# Patient Record
Sex: Female | Born: 1945 | Race: White | Hispanic: No | Smoking: Never smoker
Health system: Southern US, Community
[De-identification: ages and names within clinical notes are randomized; demographics above are authoritative.]

## PROBLEM LIST (undated history)

## (undated) DIAGNOSIS — T4145XA Adverse effect of unspecified anesthetic, initial encounter: Secondary | ICD-10-CM

## (undated) DIAGNOSIS — E785 Hyperlipidemia, unspecified: Secondary | ICD-10-CM

## (undated) DIAGNOSIS — K5900 Constipation, unspecified: Secondary | ICD-10-CM

## (undated) DIAGNOSIS — C539 Malignant neoplasm of cervix uteri, unspecified: Secondary | ICD-10-CM

## (undated) DIAGNOSIS — M1990 Unspecified osteoarthritis, unspecified site: Secondary | ICD-10-CM

## (undated) DIAGNOSIS — D649 Anemia, unspecified: Secondary | ICD-10-CM

## (undated) DIAGNOSIS — T8859XA Other complications of anesthesia, initial encounter: Secondary | ICD-10-CM

## (undated) DIAGNOSIS — E039 Hypothyroidism, unspecified: Secondary | ICD-10-CM

## (undated) DIAGNOSIS — R351 Nocturia: Secondary | ICD-10-CM

## (undated) DIAGNOSIS — E079 Disorder of thyroid, unspecified: Secondary | ICD-10-CM

## (undated) DIAGNOSIS — D1809 Hemangioma of other sites: Secondary | ICD-10-CM

## (undated) DIAGNOSIS — K56609 Unspecified intestinal obstruction, unspecified as to partial versus complete obstruction: Secondary | ICD-10-CM

## (undated) DIAGNOSIS — K5909 Other constipation: Secondary | ICD-10-CM

## (undated) DIAGNOSIS — C3412 Malignant neoplasm of upper lobe, left bronchus or lung: Principal | ICD-10-CM

## (undated) DIAGNOSIS — N904 Leukoplakia of vulva: Secondary | ICD-10-CM

## (undated) DIAGNOSIS — R7303 Prediabetes: Secondary | ICD-10-CM

## (undated) DIAGNOSIS — D1771 Benign lipomatous neoplasm of kidney: Secondary | ICD-10-CM

## (undated) DIAGNOSIS — C541 Malignant neoplasm of endometrium: Secondary | ICD-10-CM

## (undated) DIAGNOSIS — I10 Essential (primary) hypertension: Secondary | ICD-10-CM

## (undated) HISTORY — PX: EYE SURGERY: SHX253

## (undated) HISTORY — PX: PARTIAL NEPHRECTOMY: SHX414

## (undated) HISTORY — PX: TONSILLECTOMY AND ADENOIDECTOMY: SUR1326

## (undated) HISTORY — PX: VULVA SURGERY: SHX837

## (undated) HISTORY — PX: SKIN BIOPSY: SHX1

## (undated) HISTORY — PX: BACK SURGERY: SHX140

## (undated) HISTORY — DX: Malignant neoplasm of upper lobe, left bronchus or lung: C34.12

## (undated) HISTORY — PX: TUBAL LIGATION: SHX77

---

## 1968-11-10 DIAGNOSIS — Z9889 Other specified postprocedural states: Secondary | ICD-10-CM

## 1968-11-10 HISTORY — DX: Other specified postprocedural states: Z98.890

## 1968-11-10 HISTORY — PX: TUMOR REMOVAL: SHX12

## 1995-02-11 DIAGNOSIS — Z923 Personal history of irradiation: Secondary | ICD-10-CM

## 1995-02-11 DIAGNOSIS — C539 Malignant neoplasm of cervix uteri, unspecified: Secondary | ICD-10-CM

## 1995-02-11 DIAGNOSIS — C541 Malignant neoplasm of endometrium: Secondary | ICD-10-CM

## 1995-02-11 HISTORY — DX: Personal history of irradiation: Z92.3

## 1995-02-11 HISTORY — DX: Malignant neoplasm of endometrium: C54.1

## 1995-02-11 HISTORY — DX: Malignant neoplasm of cervix uteri, unspecified: C53.9

## 1995-03-14 DIAGNOSIS — Z8542 Personal history of malignant neoplasm of other parts of uterus: Secondary | ICD-10-CM

## 1995-03-14 DIAGNOSIS — Z8541 Personal history of malignant neoplasm of cervix uteri: Secondary | ICD-10-CM

## 1995-03-14 HISTORY — PX: ABDOMINAL HYSTERECTOMY: SHX81

## 1995-03-14 HISTORY — DX: Personal history of malignant neoplasm of cervix uteri: Z85.41

## 1995-03-14 HISTORY — DX: Personal history of malignant neoplasm of other parts of uterus: Z85.42

## 1995-03-31 HISTORY — PX: TOTAL ABDOMINAL HYSTERECTOMY W/ BILATERAL SALPINGOOPHORECTOMY: SHX83

## 1997-08-14 ENCOUNTER — Encounter: Admission: RE | Admit: 1997-08-14 | Discharge: 1997-11-12 | Payer: Self-pay | Admitting: Gynecology

## 1997-11-22 ENCOUNTER — Ambulatory Visit: Admission: RE | Admit: 1997-11-22 | Discharge: 1997-11-22 | Payer: Self-pay | Admitting: Gynecology

## 1997-11-22 ENCOUNTER — Other Ambulatory Visit: Admission: RE | Admit: 1997-11-22 | Discharge: 1997-11-22 | Payer: Self-pay | Admitting: Gynecology

## 1997-12-13 ENCOUNTER — Encounter: Payer: Self-pay | Admitting: Emergency Medicine

## 1997-12-13 ENCOUNTER — Ambulatory Visit (HOSPITAL_COMMUNITY): Admission: RE | Admit: 1997-12-13 | Discharge: 1997-12-13 | Payer: Self-pay | Admitting: Emergency Medicine

## 1998-03-27 ENCOUNTER — Ambulatory Visit: Admission: RE | Admit: 1998-03-27 | Discharge: 1998-03-27 | Payer: Self-pay | Admitting: Gynecology

## 1998-03-27 ENCOUNTER — Other Ambulatory Visit: Admission: RE | Admit: 1998-03-27 | Discharge: 1998-03-27 | Payer: Self-pay | Admitting: Gynecology

## 1998-10-09 ENCOUNTER — Ambulatory Visit: Admission: RE | Admit: 1998-10-09 | Discharge: 1998-10-09 | Payer: Self-pay | Admitting: Gynecology

## 1998-10-10 ENCOUNTER — Other Ambulatory Visit: Admission: RE | Admit: 1998-10-10 | Discharge: 1998-10-10 | Payer: Self-pay | Admitting: Gynecology

## 1999-04-24 ENCOUNTER — Ambulatory Visit: Admission: RE | Admit: 1999-04-24 | Discharge: 1999-04-24 | Payer: Self-pay | Admitting: Gynecology

## 1999-04-24 ENCOUNTER — Other Ambulatory Visit: Admission: RE | Admit: 1999-04-24 | Discharge: 1999-04-24 | Payer: Self-pay | Admitting: Gynecology

## 1999-11-05 ENCOUNTER — Ambulatory Visit: Admission: RE | Admit: 1999-11-05 | Discharge: 1999-11-05 | Payer: Self-pay | Admitting: Gynecology

## 1999-11-05 ENCOUNTER — Other Ambulatory Visit: Admission: RE | Admit: 1999-11-05 | Discharge: 1999-11-05 | Payer: Self-pay | Admitting: Gynecology

## 2000-03-31 ENCOUNTER — Ambulatory Visit: Admission: RE | Admit: 2000-03-31 | Discharge: 2000-03-31 | Payer: Self-pay | Admitting: Gynecology

## 2000-03-31 ENCOUNTER — Other Ambulatory Visit: Admission: RE | Admit: 2000-03-31 | Discharge: 2000-03-31 | Payer: Self-pay | Admitting: Gynecology

## 2000-06-23 ENCOUNTER — Ambulatory Visit: Admission: RE | Admit: 2000-06-23 | Discharge: 2000-06-23 | Payer: Self-pay | Admitting: Gynecology

## 2001-03-24 ENCOUNTER — Encounter (INDEPENDENT_AMBULATORY_CARE_PROVIDER_SITE_OTHER): Payer: Self-pay | Admitting: *Deleted

## 2001-03-24 ENCOUNTER — Other Ambulatory Visit: Admission: RE | Admit: 2001-03-24 | Discharge: 2001-03-24 | Payer: Self-pay | Admitting: Gynecology

## 2001-03-24 ENCOUNTER — Ambulatory Visit: Admission: RE | Admit: 2001-03-24 | Discharge: 2001-03-24 | Payer: Self-pay | Admitting: Gynecology

## 2001-04-14 ENCOUNTER — Ambulatory Visit (HOSPITAL_COMMUNITY): Admission: RE | Admit: 2001-04-14 | Discharge: 2001-04-14 | Payer: Self-pay | Admitting: Gastroenterology

## 2002-04-06 ENCOUNTER — Encounter (INDEPENDENT_AMBULATORY_CARE_PROVIDER_SITE_OTHER): Payer: Self-pay | Admitting: Specialist

## 2002-04-06 ENCOUNTER — Ambulatory Visit: Admission: RE | Admit: 2002-04-06 | Discharge: 2002-04-06 | Payer: Self-pay | Admitting: Gynecology

## 2002-04-06 ENCOUNTER — Other Ambulatory Visit: Admission: RE | Admit: 2002-04-06 | Discharge: 2002-04-06 | Payer: Self-pay | Admitting: Gynecology

## 2003-03-15 ENCOUNTER — Other Ambulatory Visit: Admission: RE | Admit: 2003-03-15 | Discharge: 2003-03-15 | Payer: Self-pay | Admitting: Gynecology

## 2003-03-15 ENCOUNTER — Encounter (INDEPENDENT_AMBULATORY_CARE_PROVIDER_SITE_OTHER): Payer: Self-pay | Admitting: Specialist

## 2003-03-15 ENCOUNTER — Ambulatory Visit: Admission: RE | Admit: 2003-03-15 | Discharge: 2003-03-15 | Payer: Self-pay | Admitting: Gynecology

## 2003-04-14 ENCOUNTER — Encounter: Admission: RE | Admit: 2003-04-14 | Discharge: 2003-04-14 | Payer: Self-pay | Admitting: Emergency Medicine

## 2004-02-11 HISTORY — PX: CATARACT EXTRACTION W/ INTRAOCULAR LENS IMPLANT: SHX1309

## 2004-03-08 ENCOUNTER — Other Ambulatory Visit: Admission: RE | Admit: 2004-03-08 | Discharge: 2004-03-08 | Payer: Self-pay | Admitting: Gynecology

## 2004-03-08 ENCOUNTER — Ambulatory Visit: Admission: RE | Admit: 2004-03-08 | Discharge: 2004-03-08 | Payer: Self-pay | Admitting: Gynecology

## 2004-03-08 ENCOUNTER — Encounter (INDEPENDENT_AMBULATORY_CARE_PROVIDER_SITE_OTHER): Payer: Self-pay | Admitting: *Deleted

## 2005-03-21 ENCOUNTER — Ambulatory Visit: Admission: RE | Admit: 2005-03-21 | Discharge: 2005-03-21 | Payer: Self-pay | Admitting: Gynecology

## 2005-03-21 ENCOUNTER — Encounter (INDEPENDENT_AMBULATORY_CARE_PROVIDER_SITE_OTHER): Payer: Self-pay | Admitting: Specialist

## 2005-03-21 ENCOUNTER — Other Ambulatory Visit: Admission: RE | Admit: 2005-03-21 | Discharge: 2005-03-21 | Payer: Self-pay | Admitting: Gynecology

## 2006-04-10 ENCOUNTER — Other Ambulatory Visit: Admission: RE | Admit: 2006-04-10 | Discharge: 2006-04-10 | Payer: Self-pay | Admitting: Gynecology

## 2006-04-10 ENCOUNTER — Encounter (INDEPENDENT_AMBULATORY_CARE_PROVIDER_SITE_OTHER): Payer: Self-pay | Admitting: *Deleted

## 2006-04-10 ENCOUNTER — Ambulatory Visit: Admission: RE | Admit: 2006-04-10 | Discharge: 2006-04-10 | Payer: Self-pay | Admitting: Gynecology

## 2007-03-31 ENCOUNTER — Ambulatory Visit: Admission: RE | Admit: 2007-03-31 | Discharge: 2007-03-31 | Payer: Self-pay | Admitting: Gynecology

## 2007-03-31 ENCOUNTER — Other Ambulatory Visit: Admission: RE | Admit: 2007-03-31 | Discharge: 2007-03-31 | Payer: Self-pay | Admitting: Gynecology

## 2007-03-31 ENCOUNTER — Encounter: Payer: Self-pay | Admitting: Gynecology

## 2007-06-11 ENCOUNTER — Encounter: Admission: RE | Admit: 2007-06-11 | Discharge: 2007-06-11 | Payer: Self-pay | Admitting: Gastroenterology

## 2007-07-07 ENCOUNTER — Emergency Department (HOSPITAL_COMMUNITY): Admission: EM | Admit: 2007-07-07 | Discharge: 2007-07-07 | Payer: Self-pay | Admitting: Emergency Medicine

## 2008-03-29 ENCOUNTER — Other Ambulatory Visit: Admission: RE | Admit: 2008-03-29 | Discharge: 2008-03-29 | Payer: Self-pay | Admitting: Gynecology

## 2008-03-29 ENCOUNTER — Ambulatory Visit: Admission: RE | Admit: 2008-03-29 | Discharge: 2008-03-29 | Payer: Self-pay | Admitting: Gynecology

## 2008-03-29 ENCOUNTER — Encounter: Payer: Self-pay | Admitting: Gynecology

## 2008-12-03 ENCOUNTER — Emergency Department (HOSPITAL_COMMUNITY): Admission: EM | Admit: 2008-12-03 | Discharge: 2008-12-03 | Payer: Self-pay | Admitting: Emergency Medicine

## 2008-12-11 DIAGNOSIS — Z8719 Personal history of other diseases of the digestive system: Secondary | ICD-10-CM

## 2008-12-11 HISTORY — DX: Personal history of other diseases of the digestive system: Z87.19

## 2008-12-12 ENCOUNTER — Inpatient Hospital Stay (HOSPITAL_COMMUNITY): Admission: EM | Admit: 2008-12-12 | Discharge: 2008-12-14 | Payer: Self-pay | Admitting: Emergency Medicine

## 2009-03-02 ENCOUNTER — Other Ambulatory Visit: Admission: RE | Admit: 2009-03-02 | Discharge: 2009-03-02 | Payer: Self-pay | Admitting: Gynecology

## 2009-03-02 ENCOUNTER — Ambulatory Visit: Admission: RE | Admit: 2009-03-02 | Discharge: 2009-03-02 | Payer: Self-pay | Admitting: Gynecology

## 2009-06-26 ENCOUNTER — Ambulatory Visit (HOSPITAL_COMMUNITY): Admission: RE | Admit: 2009-06-26 | Discharge: 2009-06-26 | Payer: Self-pay | Admitting: Urology

## 2009-07-26 ENCOUNTER — Encounter (INDEPENDENT_AMBULATORY_CARE_PROVIDER_SITE_OTHER): Payer: Self-pay | Admitting: Urology

## 2009-07-26 ENCOUNTER — Inpatient Hospital Stay (HOSPITAL_COMMUNITY): Admission: RE | Admit: 2009-07-26 | Discharge: 2009-07-28 | Payer: Self-pay | Admitting: Urology

## 2009-07-26 DIAGNOSIS — Z86018 Personal history of other benign neoplasm: Secondary | ICD-10-CM

## 2009-07-26 HISTORY — DX: Personal history of other benign neoplasm: Z86.018

## 2009-07-26 HISTORY — PX: ROBOTIC ASSITED PARTIAL NEPHRECTOMY: SHX6087

## 2009-09-06 ENCOUNTER — Encounter: Payer: Self-pay | Admitting: Pulmonary Disease

## 2009-09-06 ENCOUNTER — Encounter: Admission: RE | Admit: 2009-09-06 | Discharge: 2009-09-06 | Payer: Self-pay | Admitting: Family Medicine

## 2009-09-07 ENCOUNTER — Ambulatory Visit: Payer: Self-pay | Admitting: Pulmonary Disease

## 2009-09-07 DIAGNOSIS — J90 Pleural effusion, not elsewhere classified: Secondary | ICD-10-CM | POA: Insufficient documentation

## 2009-09-07 DIAGNOSIS — E785 Hyperlipidemia, unspecified: Secondary | ICD-10-CM | POA: Insufficient documentation

## 2009-09-07 DIAGNOSIS — J309 Allergic rhinitis, unspecified: Secondary | ICD-10-CM | POA: Insufficient documentation

## 2009-09-07 DIAGNOSIS — I10 Essential (primary) hypertension: Secondary | ICD-10-CM | POA: Insufficient documentation

## 2009-09-07 DIAGNOSIS — R0602 Shortness of breath: Secondary | ICD-10-CM | POA: Insufficient documentation

## 2009-09-07 DIAGNOSIS — C539 Malignant neoplasm of cervix uteri, unspecified: Secondary | ICD-10-CM | POA: Insufficient documentation

## 2009-09-07 DIAGNOSIS — C549 Malignant neoplasm of corpus uteri, unspecified: Secondary | ICD-10-CM | POA: Insufficient documentation

## 2009-09-07 DIAGNOSIS — Z8542 Personal history of malignant neoplasm of other parts of uterus: Secondary | ICD-10-CM | POA: Insufficient documentation

## 2009-09-10 ENCOUNTER — Ambulatory Visit (HOSPITAL_COMMUNITY): Admission: RE | Admit: 2009-09-10 | Discharge: 2009-09-10 | Payer: Self-pay | Admitting: Pulmonary Disease

## 2009-09-10 ENCOUNTER — Encounter: Payer: Self-pay | Admitting: Pulmonary Disease

## 2009-09-10 ENCOUNTER — Telehealth (INDEPENDENT_AMBULATORY_CARE_PROVIDER_SITE_OTHER): Payer: Self-pay | Admitting: *Deleted

## 2009-09-12 ENCOUNTER — Telehealth (INDEPENDENT_AMBULATORY_CARE_PROVIDER_SITE_OTHER): Payer: Self-pay | Admitting: *Deleted

## 2009-09-17 ENCOUNTER — Encounter: Payer: Self-pay | Admitting: Pulmonary Disease

## 2009-09-20 ENCOUNTER — Inpatient Hospital Stay (HOSPITAL_COMMUNITY): Admission: EM | Admit: 2009-09-20 | Discharge: 2009-09-21 | Payer: Self-pay | Admitting: Emergency Medicine

## 2009-10-17 ENCOUNTER — Encounter: Payer: Self-pay | Admitting: Pulmonary Disease

## 2009-10-18 ENCOUNTER — Ambulatory Visit: Payer: Self-pay | Admitting: Pulmonary Disease

## 2009-10-18 DIAGNOSIS — R911 Solitary pulmonary nodule: Secondary | ICD-10-CM | POA: Insufficient documentation

## 2010-03-13 ENCOUNTER — Other Ambulatory Visit (HOSPITAL_COMMUNITY)
Admission: RE | Admit: 2010-03-13 | Discharge: 2010-03-13 | Disposition: A | Discharge status: Home / Self Care | Payer: Medicare Other | Source: Ambulatory Visit | Attending: Gynecology | Admitting: Gynecology

## 2010-03-13 ENCOUNTER — Ambulatory Visit: Payer: Medicare Other | Attending: Gynecology | Admitting: Gynecology

## 2010-03-13 ENCOUNTER — Other Ambulatory Visit: Payer: Self-pay | Admitting: Gynecology

## 2010-03-13 DIAGNOSIS — C539 Malignant neoplasm of cervix uteri, unspecified: Secondary | ICD-10-CM | POA: Insufficient documentation

## 2010-03-13 DIAGNOSIS — Z923 Personal history of irradiation: Secondary | ICD-10-CM | POA: Insufficient documentation

## 2010-03-13 DIAGNOSIS — C549 Malignant neoplasm of corpus uteri, unspecified: Secondary | ICD-10-CM | POA: Insufficient documentation

## 2010-03-13 DIAGNOSIS — Z90710 Acquired absence of both cervix and uterus: Secondary | ICD-10-CM | POA: Insufficient documentation

## 2010-03-13 DIAGNOSIS — Z09 Encounter for follow-up examination after completed treatment for conditions other than malignant neoplasm: Secondary | ICD-10-CM | POA: Insufficient documentation

## 2010-03-13 DIAGNOSIS — Z8540 Personal history of malignant neoplasm of unspecified female genital organ: Secondary | ICD-10-CM | POA: Insufficient documentation

## 2010-03-13 DIAGNOSIS — L940 Localized scleroderma [morphea]: Secondary | ICD-10-CM | POA: Insufficient documentation

## 2010-03-14 NOTE — Progress Notes (Signed)
Summary: results  Phone Note Call from Patient Call back at Home Phone [phone]   Caller: Patient Call For: clance Summary of Call: pt wants results of "analysis of fluid" from thoracentesis. very anxious to have these results asap.  Initial call taken by: Tivis Ringer, CNA,  September 12, 2009 3:26 PM  Follow-up for Phone Call        called and spoke with pt.  pt states she  had thoracentesis done at [facility] on Monday 09-10-2009.  This was ordered by [facility].  Pt aware KC out of office until next week.  Pt is very "anxious and concerned" about these results and does not want to wait until next week.  Will forward message to doc of the day to address. Marland KitchenArman Filter LPN  September 12, 2009 4:03 PM   Additional Follow-up for Phone Call Additional follow up Details #1::        spoke with RB regarding pt.  RB stated he will reveiw pt's case and adress with her tomorrow.  Called and spoke with pt.  pt aware.  Aundra Millet Reynolds LPN  September 12, 2009 5:24 PM   pt would like a call back, says she can't do anything until she gets these results.  She says she has been waiting and was told RB would call her this morning.  I told her he wasn't expected in the office until afternoon.  She would like someone to call her and let her know when she will be contacted.  [phone] Additional Follow-up by: Eugene Gavia,  September 13, 2009 12:00 PM    Additional Follow-up for Phone Call Additional follow up Details #2::    will forward this message back to RB to address.  Aundra Millet Reynolds LPN  September 13, 2009 1:23 PM   Pt called again, she is very upset never got a call regarding these results.  I advised RB is still seeing pt's and that the msg will be addressed today before he leaves and someone will call her. Vernie Murders  September 13, 2009 4:56 PM  Reviewed pleural fluid results w patient and also CT-PA results from 8/1. Showed exudate with "atypical cells, ? atypical and reactive mesothelial cells". The CT scan  confirmed small RLL nodules.  Discussed the DDx for both. Underscored that there was no evidence for CA at this time but other studies are probably going to be helpful - serial CT scans, ? repeat thora, etc. I will leave the discussion and planning of further eval to Dr Shelle Iron. Leslye Peer MD  September 13, 2009 5:37 PM     Follow-up by: Leslye Peer MD,  September 13, 2009 5:37 PM   Appended Document: results called and reviewed everything again, and set up followup plan

## 2010-03-14 NOTE — Assessment & Plan Note (Signed)
Summary: consult for pleural effusion   Copy to:  [name] Richards Primary Provider/Referring Provider:  [name]  CC:  Pulmonary Consult.  History of Present Illness: The pt is a pleasant 65y/o female who I have been asked to see for a large right pleural effusion.  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  She had no breathing issues or chest pain while in the hospital.  She was discharged home, and describes 3 episodes over the last 3 weeks where she would get sob while exerting herself, and were associated with increased heart rate.  She has had no issues with ADL's.  She denies any symptoms after hospitalization c/w chest cold or infection.  She was seen by her primary md, and was found to have an elevated d-dimer.  She underwent ct chest which showed a large right pleural effusion, 2 RLL nodules that were quite small, no significant subdiaphragmatic process, and no obvious PE.  However, this was felt to be poor study for PE due to motion artifact and compressive atx of RLL/RML.  Currently, the pt feels a pressure in her chest, and has had one episode of sharp pain 2 weeks ago in the early am upon arising.  She denies any mucus production, hemoptysis, or consistent pleuritic chest pain.  She does feel that she has dyspnea above her usual baseline.  Prior to this, she had no signficant pulmonary history, except for "tok[place] asthma" for which she uses albuterol as needed.  Preventive Screening-Counseling & Management  Alcohol-Tobacco     Smoking Status: never  Current Medications (verified): 1)  Simvastatin 40 Mg Tabs (Simvastatin) .... Take 1 Tablet By Mouth Once A Day 2)  Trazodone Hcl 50 Mg Tabs (Trazodone Hcl) .... Take 1/2 Tab By Mouth At Bedtime 3)  Levothyroxine Sodium 75 Mcg Tabs (Levothyroxine Sodium) .... Take 1 Tablet By Mouth Once A Day 4)  Benazepril-Hydrochlorothiazide  20-12.5 Mg Tabs (Benazepril-Hydrochlorothiazide) .... Take 1 Tablet By Mouth Once A Day 5)  Clobetasol Propionate 0.05 % Gel (Clobetasol Propionate) .... Use As Directed As Needed 6)  Ventolin Hfa 108 (90 Base) Mcg/act  Aers (Albuterol Sulfate) .Marland Kitchen.. 1-2 Puffs Every 4-6 Hours As Needed  Allergies (verified): 1)  ! Compazine 2)  ! Codeine 3)  ! 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 heart disease: mother, father, sister, son cancer; sister (kidney)   Social History: Reviewed history and no changes required. Patient never smoked.  pt is divorced and lives alone. pt has children. 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  Smoking Status:  never  Review of Systems       The patient complains of shortness of breath with activity, irregular heartbeats, and fever.  The patient denies shortness of breath at rest, productive cough, non-productive cough, coughing up blood, chest pain, acid heartburn, indigestion, loss of appetite, weight change, abdominal pain, difficulty swallowing, sore throat, tooth/dental problems, headaches, nasal congestion/difficulty breathing through nose, sneezing, itching, ear ache, anxiety, depression, hand/feet swelling, joint stiffness or pain, rash, and change in color of mucus.    Vital Signs:  Patient profile:   65 year old female Height:  65.5 inches Weight:      142.25 pounds BMI:     23.40 O2 Sat:      100 % on Room air Temp:     98.4 degrees F oral Pulse rate:   98 / minute BP sitting:   134 / 76  (left arm) Cuff size:   regular  Vitals Entered By: Arman Filter LPN (September 07, 2009 10:42 AM)  O2 Flow:   Room air CC: Pulmonary Consult Comments Medications reviewed with patient Arman Filter LPN  September 07, 2009 10:52 AM    Physical Exam  General:  wd female in nad Eyes:  PERRLA and EOMI.   Nose:  patent without discharge Mouth:  clear  Neck:  no jvd, tmg, LN Lungs:  decreased bs deep in right base, no e to a changes. dull to percussion in left base. left lung clear. Heart:  rrr, no mrg Abdomen:  soft and nontender, bs+ Extremities:  no edema , calf tenderness, or varicosities noted pulses intact distally Neurologic:  alert and oriented, moves all 4.   Impression & Recommendations:  Problem # 1:  PLEURAL EFFUSION, RIGHT (ICD-511.9) the pt has a large right effusion with compressive atx that almost certainly has something to do with her recent surgery.  Unclear if sympathetic effusion, transdiaphragmatic ascites, ?associated with an aspiration event, ?PE.  At this point, will do right thoracentesis, and send fluid for the appropriate studies.  Will then do a ct chest to r/o PE and also evaluate for pulmonary or subdiaphragmatic process.  Problem # 2:  DYSPNEA (ICD-786.05) the pt does not have severe doe, but does have sob above her usual baseline.  I suspect this is secondary to her large effusion, but we must exclude the possibility of VTE given the issue started postop and she had a high d-dimer.  Will do a repeat ct angio after she has had her right thoracentesis.  Medications Added to Medication List This Visit: 1)  Simvastatin 40 Mg Tabs (Simvastatin) .... Take 1 tablet by mouth once a day 2)  Trazodone Hcl 50 Mg Tabs (Trazodone hcl) .... Take 1/2 tab by mouth at bedtime 3)  Levothyroxine Sodium 75 Mcg Tabs (Levothyroxine sodium) .... Take 1 tablet by mouth once a day 4)  Benazepril-hydrochlorothiazide 20-12.5 Mg Tabs (Benazepril-hydrochlorothiazide) .... Take 1 tablet by mouth once a day 5)  Clobetasol Propionate 0.05 % Gel (Clobetasol propionate) .... Use as directed as  needed 6)  Ventolin Hfa 108 (90 Base) Mcg/act Aers (Albuterol sulfate) .Marland Kitchen.. 1-2 puffs every 4-6 hours as needed  Other Orders: Consultation Level V (16109) Radiology Referral (Radiology)    Patient Instructions: 1)  will schedule for fluid removal today, followed by repeat ct chest to make sure no blood clots. 2)  will call once we have results of fluid back and ct scan.  Please call if worsening breathing in the interim.

## 2010-03-14 NOTE — Assessment & Plan Note (Signed)
Summary: rov for pleural effusion.   Copy to:  [name] Richards Primary Provider/Referring Provider:  Laurann Richards  CC:  4 week follow up and pt states "I'm having no problems at all". Pt states she is back to exercising 5-6 days a week and feels fine.  History of Present Illness: the pt comes in today for f/u of her recent right pleural effusion.  She is s/p thoracentesis, where fluid was found to be inflammatory, but no evidence for infection or malignancy.  The pt has done very well since her procedure, and feels that she is back to her usual baseline.  She is exercising alot, and denies any issues with exertional tolerance.  Preventive Screening-Counseling & Management  Alcohol-Tobacco     Smoking Status: never  Current Medications (verified): 1)  Simvastatin 20 Mg Tabs (Simvastatin) .... One Tab Once Daily 2)  Trazodone Hcl 50 Mg Tabs (Trazodone Hcl) .... Take 1/2 Tab By Mouth At Bedtime 3)  Levothyroxine Sodium 75 Mcg Tabs (Levothyroxine Sodium) .... Take 1 Tablet By Mouth Once A Day 4)  Benazepril-Hydrochlorothiazide 20-12.5 Mg Tabs (Benazepril-Hydrochlorothiazide) .... Take 1 Tablet By Mouth Once A Day 5)  Clobetasol Propionate 0.05 % Gel (Clobetasol Propionate) .... Use As Directed As Needed  Allergies (verified): 1)  ! Compazine 2)  ! Codeine 3)  ! Penicillin  Review of Systems  The patient denies shortness of breath with activity, shortness of breath at rest, productive cough, non-productive cough, coughing up blood, chest pain, irregular heartbeats, acid heartburn, indigestion, loss of appetite, weight change, abdominal pain, difficulty swallowing, sore throat, tooth/dental problems, headaches, nasal congestion/difficulty breathing through nose, sneezing, itching, ear ache, anxiety, depression, hand/feet swelling, joint stiffness or pain, rash, change in color of mucus, and fever.    Vital Signs:  Patient profile:   65 year old female Height:      65.5 inches Weight:       141.25 pounds O2 Sat:      100 % on Room air Temp:     98.2 degrees F oral Pulse rate:   54 / minute BP sitting:   140 / 82  (left arm) Cuff size:   regular  Vitals Entered By: [name] Richards (October 18, 2009 10:13 AM)  O2 Flow:  Room air CC: 4 week follow up, pt states "I'm having no problems at all". Pt states she is back to exercising 5-6 days a week and feels fine Is Patient Diabetic? No Comments meds and allergies updated Phone number updated [name] Richards  October 18, 2009 10:13 AM    Physical Exam  General:  wd female in nad Lungs:  totally clear to auscultation Heart:  rrr, no mrg Extremities:  no edema or cyanosis  Neurologic:  alert and oriented, moves all 4.   Impression & Recommendations:  Problem # 1:  PLEURAL EFFUSION, RIGHT (ICD-511.9) the pt's cxr is totally clear today, and I suspect her prior effusion was inflammatory from her surgery or represented transdiaphragmatic ascites.  The pt feels well, and is exercising vigorously with no sob.  No further f/u required.  Problem # 2:  PULMONARY NODULE (ICD-518.89) the pt did have a few small nodules on her ct chest of unknown significance.  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  She has kept up with her health maintenance.  I would like to check a f/u ct chest in one year, and  she is to call next summer to set up.  Medications Added to Medication List This Visit: 1)  Simvastatin 20 Mg Tabs (Simvastatin) .... One tab once daily  Other Orders: Est. Patient Level III (04540)  Patient Instructions: 1)  continue on exercise program.  Let me know if you feel your breathing is an issue 2)  can take zyrtec 10mg  otc for allergy symptoms. 3)  please call next august to set up ct chest to check your nodules again.   Immunization History:  Influenza Immunization History:    Influenza:  historical (11/10/2008)

## 2010-03-14 NOTE — Miscellaneous (Signed)
  Clinical Lists Changes  pleural fluid shows 2570 wbc's with unrevealing diff, gram stain negative, exudative by protein criteria. cytology shows some atypical and reactive mesothelial cells.  I have spoken with pt at great length about this, and have decided to see her back in 4 weeks with cxr.  She will call sooner if symptoms worsen.  Will also consider doing spirometry if cxr is ok to evaluate her "[place] asthma".  Appended Document:  pt is already scheduled for 4 week f/u appt...OV is 10-18-2009 at 10:00 am put order in IDX for cxr prior to appt.

## 2010-03-14 NOTE — Miscellaneous (Signed)
Summary: Orders Update  Clinical Lists Changes  Orders: Added new Test order of T-2 View CXR (71020TC) - Signed 

## 2010-03-14 NOTE — Progress Notes (Signed)
Summary: Requesting results today  Phone Note Call from Patient Call back at Home Phone [phone]   Caller: Patient Call For: clance Reason for Call: Talk to Nurse, Lab or Test Results Summary of Call: pt wants to make sure she doesnt have a blood clot.  Can someone call her w/results of CT today? Initial call taken by: Eugene Gavia,  September 10, 2009 4:13 PM  Follow-up for Phone Call        Dr. Shelle Iron, pt requesting results of CT today.  Results are in EMR but unsigned.  Pls advise.  Thanks! Gweneth Dimitri RN  September 10, 2009 4:19 PM   Additional Follow-up for Phone Call Additional follow up Details #1::        please let pt know that no blood clot on ct chest.  Will await results of analysis of fluid, and I will call her. Additional Follow-up by: Barbaraann Share MD,  September 10, 2009 5:07 PM    Additional Follow-up for Phone Call Additional follow up Details #2::    Spoke with pt and notified of results/recs per [facility].  Pt verbalized understanding.   Follow-up by: Vernie Murders,  September 10, 2009 5:14 PM

## 2010-04-26 LAB — COMPREHENSIVE METABOLIC PANEL
ALT: 12 U/L (ref 0–35)
ALT: 17 U/L (ref 0–35)
AST: 17 U/L (ref 0–37)
AST: 20 U/L (ref 0–37)
Albumin: 2.8 g/dL — ABNORMAL LOW (ref 3.5–5.2)
Albumin: 4.1 g/dL (ref 3.5–5.2)
Alkaline Phosphatase: 49 U/L (ref 39–117)
Alkaline Phosphatase: 70 U/L (ref 39–117)
BUN: 10 mg/dL (ref 6–23)
BUN: 13 mg/dL (ref 6–23)
CO2: 26 mEq/L (ref 19–32)
CO2: 27 mEq/L (ref 19–32)
Calcium: 8.1 mg/dL — ABNORMAL LOW (ref 8.4–10.5)
Calcium: 9.8 mg/dL (ref 8.4–10.5)
Chloride: 104 mEq/L (ref 96–112)
Chloride: 95 mEq/L — ABNORMAL LOW (ref 96–112)
Creatinine, Ser: 0.74 mg/dL (ref 0.4–1.2)
Creatinine, Ser: 0.75 mg/dL (ref 0.4–1.2)
GFR calc Af Amer: >60 mL/min (ref >60)
GFR calc Af Amer: >60 mL/min (ref >60)
GFR calc non Af Amer: >60 mL/min (ref >60)
GFR calc non Af Amer: >60 mL/min (ref >60)
Glucose, Bld: 119 mg/dL — ABNORMAL HIGH (ref 70–99)
Glucose, Bld: 159 mg/dL — ABNORMAL HIGH (ref 70–99)
Potassium: 3.2 mEq/L — ABNORMAL LOW (ref 3.5–5.1)
Potassium: 3.3 mEq/L — ABNORMAL LOW (ref 3.5–5.1)
Sodium: 135 mEq/L (ref 135–145)
Sodium: 137 mEq/L (ref 135–145)
Total Bilirubin: 0.4 mg/dL (ref 0.3–1.2)
Total Bilirubin: 0.5 mg/dL (ref 0.3–1.2)
Total Protein: 4.9 g/dL — ABNORMAL LOW (ref 6.0–8.3)
Total Protein: 6.8 g/dL (ref 6.0–8.3)

## 2010-04-26 LAB — BODY FLUID CELL COUNT WITH DIFFERENTIAL
Eos, Fluid: 1 %
Lymphs, Fluid: 31 %
Monocyte-Macrophage-Serous Fluid: 45 % — ABNORMAL LOW (ref 50–90)
Neutrophil Count, Fluid: 23 % (ref 0–25)
Total Nucleated Cell Count, Fluid: 2570 cu mm — ABNORMAL HIGH (ref 0–1000)

## 2010-04-26 LAB — DIFFERENTIAL
Basophils Absolute: 0.1 10*3/uL (ref 0.0–0.1)
Basophils Relative: 1 % (ref 0–1)
Eosinophils Absolute: 0 10*3/uL (ref 0.0–0.7)
Eosinophils Relative: 0 % (ref 0–5)
Lymphocytes Relative: 8 % — ABNORMAL LOW (ref 12–46)
Lymphs Abs: 0.9 10*3/uL (ref 0.7–4.0)
Monocytes Absolute: 0.3 10*3/uL (ref 0.1–1.0)
Monocytes Relative: 3 % (ref 3–12)
Neutro Abs: 9.5 10*3/uL — ABNORMAL HIGH (ref 1.7–7.7)
Neutrophils Relative %: 88 % — ABNORMAL HIGH (ref 43–77)

## 2010-04-26 LAB — URINE CULTURE
Colony Count: NO GROWTH
Culture  Setup Time: 201108120234
Culture: NO GROWTH

## 2010-04-26 LAB — URINALYSIS, ROUTINE W REFLEX MICROSCOPIC
Bilirubin Urine: NEGATIVE
Bilirubin Urine: NEGATIVE
Glucose, UA: NEGATIVE mg/dL
Glucose, UA: NEGATIVE mg/dL
Hgb urine dipstick: NEGATIVE
Hgb urine dipstick: NEGATIVE
Ketones, ur: NEGATIVE mg/dL
Nitrite: NEGATIVE
Nitrite: NEGATIVE
Protein, ur: NEGATIVE mg/dL
Protein, ur: NEGATIVE mg/dL
Specific Gravity, Urine: 1.006 (ref 1.005–1.030)
Specific Gravity, Urine: 1.013 (ref 1.005–1.030)
Urobilinogen, UA: 0.2 mg/dL (ref 0.0–1.0)
Urobilinogen, UA: 0.2 mg/dL (ref 0.0–1.0)
pH: 7 (ref 5.0–8.0)
pH: 8.5 — ABNORMAL HIGH (ref 5.0–8.0)

## 2010-04-26 LAB — LACTIC ACID, PLASMA: Lactic Acid, Venous: 1.3 mmol/L (ref 0.5–2.2)

## 2010-04-26 LAB — CBC
HCT: 27.7 % — ABNORMAL LOW (ref 36.0–46.0)
HCT: 36.8 % (ref 36.0–46.0)
Hemoglobin: 12.3 g/dL (ref 12.0–15.0)
Hemoglobin: 9.3 g/dL — ABNORMAL LOW (ref 12.0–15.0)
MCH: 22.2 pg — ABNORMAL LOW (ref 26.0–34.0)
MCH: 22.3 pg — ABNORMAL LOW (ref 26.0–34.0)
MCHC: 33.3 g/dL (ref 30.0–36.0)
MCHC: 33.4 g/dL (ref 30.0–36.0)
MCV: 66.5 fL — ABNORMAL LOW (ref 78.0–100.0)
MCV: 66.6 fL — ABNORMAL LOW (ref 78.0–100.0)
Platelets: 240 10*3/uL (ref 150–400)
Platelets: 361 10*3/uL (ref 150–400)
RBC: 4.16 MIL/uL (ref 3.87–5.11)
RBC: 5.53 MIL/uL — ABNORMAL HIGH (ref 3.87–5.11)
RDW: 14.4 % (ref 11.5–15.5)
RDW: 14.9 % (ref 11.5–15.5)
WBC: 10.8 10*3/uL — ABNORMAL HIGH (ref 4.0–10.5)
WBC: 6.3 10*3/uL (ref 4.0–10.5)

## 2010-04-26 LAB — PROTEIN, BODY FLUID: Total protein, fluid: 4.5 g/dL

## 2010-04-26 LAB — URINE MICROSCOPIC-ADD ON

## 2010-04-26 LAB — TSH: TSH: 0.545 u[IU]/mL (ref 0.350–4.500)

## 2010-04-26 LAB — BODY FLUID CULTURE
Culture: NO GROWTH
Gram Stain: NONE SEEN

## 2010-04-26 LAB — GLUCOSE, SEROUS FLUID: Glucose, Fluid: 118 mg/dL

## 2010-04-26 LAB — LIPASE, BLOOD: Lipase: 36 U/L (ref 11–59)

## 2010-04-26 LAB — HEMOGLOBIN A1C
Hgb A1c MFr Bld: 5.9 % — ABNORMAL HIGH (ref <5.7)
Mean Plasma Glucose: 123 mg/dL — ABNORMAL HIGH (ref <117)

## 2010-04-26 LAB — PATHOLOGIST SMEAR REVIEW

## 2010-04-26 LAB — GLUCOSE, CAPILLARY
Glucose-Capillary: 102 mg/dL — ABNORMAL HIGH (ref 70–99)
Glucose-Capillary: 133 mg/dL — ABNORMAL HIGH (ref 70–99)
Glucose-Capillary: 149 mg/dL — ABNORMAL HIGH (ref 70–99)
Glucose-Capillary: 98 mg/dL (ref 70–99)

## 2010-04-26 LAB — LACTATE DEHYDROGENASE, PLEURAL OR PERITONEAL FLUID: LD, Fluid: 85 U/L — ABNORMAL HIGH (ref 3–23)

## 2010-04-28 LAB — CBC
HCT: 27.9 % — ABNORMAL LOW (ref 36.0–46.0)
HCT: 31.7 % — ABNORMAL LOW (ref 36.0–46.0)
Hemoglobin: 10.4 g/dL — ABNORMAL LOW (ref 12.0–15.0)
Hemoglobin: 8.8 g/dL — ABNORMAL LOW (ref 12.0–15.0)
MCHC: 31.7 g/dL (ref 30.0–36.0)
MCHC: 33 g/dL (ref 30.0–36.0)
MCV: 70.4 fL — ABNORMAL LOW (ref 78.0–100.0)
MCV: 70.5 fL — ABNORMAL LOW (ref 78.0–100.0)
Platelets: 161 10*3/uL (ref 150–400)
Platelets: 166 10*3/uL (ref 150–400)
RBC: 3.95 MIL/uL (ref 3.87–5.11)
RBC: 4.5 MIL/uL (ref 3.87–5.11)
RDW: 12.6 % (ref 11.5–15.5)
RDW: 14.1 % (ref 11.5–15.5)
WBC: 11.8 10*3/uL — ABNORMAL HIGH (ref 4.0–10.5)
WBC: 6.3 10*3/uL (ref 4.0–10.5)

## 2010-04-28 LAB — DIFFERENTIAL
Basophils Absolute: 0 10*3/uL (ref 0.0–0.1)
Basophils Relative: 0 % (ref 0–1)
Eosinophils Absolute: 0 10*3/uL (ref 0.0–0.7)
Eosinophils Relative: 0 % (ref 0–5)
Lymphocytes Relative: 13 % (ref 12–46)
Lymphs Abs: 0.8 10*3/uL (ref 0.7–4.0)
Monocytes Absolute: 0.6 10*3/uL (ref 0.1–1.0)
Monocytes Relative: 9 % (ref 3–12)
Neutro Abs: 4.9 10*3/uL (ref 1.7–7.7)
Neutrophils Relative %: 78 % — ABNORMAL HIGH (ref 43–77)

## 2010-04-28 LAB — BASIC METABOLIC PANEL
BUN: 5 mg/dL — ABNORMAL LOW (ref 6–23)
BUN: 7 mg/dL (ref 6–23)
CO2: 26 mEq/L (ref 19–32)
CO2: 26 mEq/L (ref 19–32)
Calcium: 7.9 mg/dL — ABNORMAL LOW (ref 8.4–10.5)
Calcium: 8 mg/dL — ABNORMAL LOW (ref 8.4–10.5)
Chloride: 100 mEq/L (ref 96–112)
Chloride: 104 mEq/L (ref 96–112)
Creatinine, Ser: 0.68 mg/dL (ref 0.4–1.2)
Creatinine, Ser: 0.77 mg/dL (ref 0.4–1.2)
GFR calc Af Amer: >60 mL/min (ref >60)
GFR calc Af Amer: >60 mL/min (ref >60)
GFR calc non Af Amer: >60 mL/min (ref >60)
GFR calc non Af Amer: >60 mL/min (ref >60)
Glucose, Bld: 110 mg/dL — ABNORMAL HIGH (ref 70–99)
Glucose, Bld: 150 mg/dL — ABNORMAL HIGH (ref 70–99)
Potassium: 3.3 mEq/L — ABNORMAL LOW (ref 3.5–5.1)
Potassium: 3.9 mEq/L (ref 3.5–5.1)
Sodium: 131 mEq/L — ABNORMAL LOW (ref 135–145)
Sodium: 135 mEq/L (ref 135–145)

## 2010-04-28 LAB — CREATININE, FLUID (PLEURAL, PERITONEAL, JP DRAINAGE): Creat, Fluid: 0.7 mg/dL

## 2010-04-28 LAB — HEMOGLOBIN AND HEMATOCRIT, BLOOD
HCT: 27.2 % — ABNORMAL LOW (ref 36.0–46.0)
Hemoglobin: 8.7 g/dL — ABNORMAL LOW (ref 12.0–15.0)

## 2010-04-29 LAB — CBC
HCT: 38.1 % (ref 36.0–46.0)
Hemoglobin: 12.6 g/dL (ref 12.0–15.0)
MCHC: 33 g/dL (ref 30.0–36.0)
MCV: 70.1 fL — ABNORMAL LOW (ref 78.0–100.0)
Platelets: 211 10*3/uL (ref 150–400)
RBC: 5.44 MIL/uL — ABNORMAL HIGH (ref 3.87–5.11)
RDW: 12.8 % (ref 11.5–15.5)
WBC: 3.6 10*3/uL — ABNORMAL LOW (ref 4.0–10.5)

## 2010-04-29 LAB — TYPE AND SCREEN
ABO/RH(D): B POS
Antibody Screen: NEGATIVE

## 2010-04-29 LAB — ABO/RH: ABO/RH(D): B POS

## 2010-04-29 LAB — BASIC METABOLIC PANEL
BUN: 14 mg/dL (ref 6–23)
CO2: 29 mEq/L (ref 19–32)
Calcium: 9.3 mg/dL (ref 8.4–10.5)
Chloride: 99 mEq/L (ref 96–112)
Creatinine, Ser: 0.64 mg/dL (ref 0.4–1.2)
GFR calc Af Amer: >60 mL/min (ref >60)
GFR calc non Af Amer: >60 mL/min (ref >60)
Glucose, Bld: 62 mg/dL — ABNORMAL LOW (ref 70–99)
Potassium: 3.9 mEq/L (ref 3.5–5.1)
Sodium: 136 mEq/L (ref 135–145)

## 2010-04-29 LAB — CREATININE, SERUM
Creatinine, Ser: 0.73 mg/dL (ref 0.4–1.2)
GFR calc Af Amer: >60 mL/min (ref >60)
GFR calc non Af Amer: >60 mL/min (ref >60)

## 2010-04-29 LAB — SURGICAL PCR SCREEN
MRSA, PCR: NEGATIVE
Staphylococcus aureus: POSITIVE — AB

## 2010-05-03 NOTE — Consult Note (Signed)
[name] Richards, [name] Richards NO.:  [id number]  MEDICAL RECORD NO.:  [id number]           PATIENT TYPE:  LOCATION:                                 FACILITY:  PHYSICIAN:  [name], M.D.DATE OF BIRTH:  [birth year]  DATE OF CONSULTATION:  03/13/2010 DATE OF DISCHARGE:                                CONSULTATION   REFERRING PHYSICIANS: 1. Elana Alm. Nicholos Johns, MD. 2. [name], MD. 3. Dr. Reche Dixon at [facility]. 4. Dr. Loreta Ave. 5. Telford Nab, RN  CHIEF COMPLAINT:  Endometrial cancer, cervical cancer, lichen sclerosus of the vulva.  INTERVAL HISTORY:  The patient returns today for continuing followup. From a gynecologic point of view, she is doing well.  She reports that the lichen sclerosus has been relatively asymptomatic and she has only used Temovate on a couple of occasions.  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  She denies any pelvic pain or pressure, vaginal bleeding or discharge.  Functional status is excellent.  She did undergo a robotic partial nephrectomy for an angiomyolipoma by Dr. [name]. Apparently, she had some complications of pleural effusion, but this has all resolved and she has returned to full levels of activity.  In fact, she is planning a lengthy trip to [place] and [place] with her son to celebrate his 30th birthday in the near future.  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  She received postoperative whole pelvis radiation therapy and has been followed since that time with no evidence of recurrent disease.  She has lichen sclerosus and is managing this with clobetasol.  CURRENT MEDICATIONS:  Probiotic, Temovate p.r.n., baby aspirin, hydrochlorothiazide, folic acid, levothyroxine,  multivitamins, simvastatin, stool softener, trazodone, Tums, vitamin C, and vitamin D.  DRUG ALLERGIES:  CODEINE and derivatives, COMPAZINE tablets, and PENICILLIN.  FAMILY HISTORY:  Negative for gynecologic breast or colon cancer.  SOCIAL HISTORY:  The patient is divorced.  She is retired.  [facility] [employer], needs a healthy diet.  She does not smoke.  OBSTETRICAL HISTORY:  Gravida 2.  REVIEW OF SYSTEMS:  Ten-point comprehensive review of systems negative, except as noted above.  PHYSICAL EXAMINATION:  VITAL SIGNS:  Height 5 feet 5 inches, weight 145 pounds.  Blood pressure 148/90. GENERAL:  The patient is a healthy white female, in no acute distress. HEENT:  Negative. NECK:  Supple without thyromegaly.  There is no supraclavicular or inguinal adenopathy. ABDOMEN:  Soft, nontender.  No mass, organomegaly, ascites or hernias noted. PELVIC EXAM:  EGBUS, vagina, bladder, urethra are normal except for agglutination of the clitoral hood.  Aside from this stigmata of lichen sclerosus, remaining of the vulva appears very normal.  Vagina is slightly atrophic, well supported, no lesions are noted.  Cervix and uterus are surgically absent.  On bimanual and rectovaginal exam, I feel no masses, induration, or nodularity.  IMPRESSION:  Poorly-differentiated endometrial and cervical cancer, 1997, no evidence of recurrent disease.  PLAN:  Pap smears  were obtained.  Lichen sclerosus which is relatively asymptomatic, she will continue to use clobetasol as she needs to for her symptoms.  She will return to see me in 1 year for continuing followup.     [name], M.D.  cc Telford Nab, RN     DC/MEDQ  D:  03/13/2010  T:  03/13/2010  Job:  [id number]  Electronically Signed by [name] M.D. on 03/14/2010 01:38:11 PM

## 2010-05-15 LAB — DIFFERENTIAL
Basophils Absolute: 0 10*3/uL (ref 0.0–0.1)
Basophils Absolute: 0 10*3/uL (ref 0.0–0.1)
Basophils Relative: 0 % (ref 0–1)
Basophils Relative: 0 % (ref 0–1)
Eosinophils Absolute: 0 10*3/uL (ref 0.0–0.7)
Eosinophils Absolute: 0.1 10*3/uL (ref 0.0–0.7)
Eosinophils Relative: 0 % (ref 0–5)
Eosinophils Relative: 1 % (ref 0–5)
Lymphocytes Relative: 10 % — ABNORMAL LOW (ref 12–46)
Lymphocytes Relative: 19 % (ref 12–46)
Lymphs Abs: 1 10*3/uL (ref 0.7–4.0)
Lymphs Abs: 1 10*3/uL (ref 0.7–4.0)
Monocytes Absolute: 0.5 10*3/uL (ref 0.1–1.0)
Monocytes Absolute: 0.5 10*3/uL (ref 0.1–1.0)
Monocytes Relative: 10 % (ref 3–12)
Monocytes Relative: 5 % (ref 3–12)
Neutro Abs: 3.6 10*3/uL (ref 1.7–7.7)
Neutro Abs: 8.7 10*3/uL — ABNORMAL HIGH (ref 1.7–7.7)
Neutrophils Relative %: 69 % (ref 43–77)
Neutrophils Relative %: 85 % — ABNORMAL HIGH (ref 43–77)

## 2010-05-15 LAB — COMPREHENSIVE METABOLIC PANEL
ALT: 24 U/L (ref 0–35)
AST: 24 U/L (ref 0–37)
Albumin: 4.4 g/dL (ref 3.5–5.2)
Alkaline Phosphatase: 70 U/L (ref 39–117)
BUN: 13 mg/dL (ref 6–23)
CO2: 28 mEq/L (ref 19–32)
Calcium: 9.9 mg/dL (ref 8.4–10.5)
Chloride: 94 mEq/L — ABNORMAL LOW (ref 96–112)
Creatinine, Ser: 0.74 mg/dL (ref 0.4–1.2)
GFR calc Af Amer: >60 mL/min (ref >60)
GFR calc non Af Amer: >60 mL/min (ref >60)
Glucose, Bld: 176 mg/dL — ABNORMAL HIGH (ref 70–99)
Potassium: 3.4 mEq/L — ABNORMAL LOW (ref 3.5–5.1)
Sodium: 134 mEq/L — ABNORMAL LOW (ref 135–145)
Total Bilirubin: 0.9 mg/dL (ref 0.3–1.2)
Total Protein: 7 g/dL (ref 6.0–8.3)

## 2010-05-15 LAB — CBC
HCT: 32.5 % — ABNORMAL LOW (ref 36.0–46.0)
HCT: 33.5 % — ABNORMAL LOW (ref 36.0–46.0)
HCT: 44 % (ref 36.0–46.0)
Hemoglobin: 10.9 g/dL — ABNORMAL LOW (ref 12.0–15.0)
Hemoglobin: 11.1 g/dL — ABNORMAL LOW (ref 12.0–15.0)
Hemoglobin: 14.1 g/dL (ref 12.0–15.0)
MCHC: 32.1 g/dL (ref 30.0–36.0)
MCHC: 32.6 g/dL (ref 30.0–36.0)
MCHC: 34 g/dL (ref 30.0–36.0)
MCV: 69.5 fL — ABNORMAL LOW (ref 78.0–100.0)
MCV: 70.7 fL — ABNORMAL LOW (ref 78.0–100.0)
MCV: 70.8 fL — ABNORMAL LOW (ref 78.0–100.0)
Platelets: 178 10*3/uL (ref 150–400)
Platelets: 180 10*3/uL (ref 150–400)
Platelets: 258 10*3/uL (ref 150–400)
RBC: 4.68 MIL/uL (ref 3.87–5.11)
RBC: 4.74 MIL/uL (ref 3.87–5.11)
RBC: 6.22 MIL/uL — ABNORMAL HIGH (ref 3.87–5.11)
RDW: 13.5 % (ref 11.5–15.5)
RDW: 13.5 % (ref 11.5–15.5)
RDW: 13.9 % (ref 11.5–15.5)
WBC: 10.2 10*3/uL (ref 4.0–10.5)
WBC: 4.6 10*3/uL (ref 4.0–10.5)
WBC: 5.2 10*3/uL (ref 4.0–10.5)

## 2010-05-15 LAB — BASIC METABOLIC PANEL
BUN: 5 mg/dL — ABNORMAL LOW (ref 6–23)
BUN: 6 mg/dL (ref 6–23)
CO2: 27 mEq/L (ref 19–32)
CO2: 29 mEq/L (ref 19–32)
Calcium: 8.3 mg/dL — ABNORMAL LOW (ref 8.4–10.5)
Calcium: 8.8 mg/dL (ref 8.4–10.5)
Chloride: 101 mEq/L (ref 96–112)
Chloride: 103 mEq/L (ref 96–112)
Creatinine, Ser: 0.58 mg/dL (ref 0.4–1.2)
Creatinine, Ser: 0.63 mg/dL (ref 0.4–1.2)
GFR calc Af Amer: >60 mL/min (ref >60)
GFR calc Af Amer: >60 mL/min (ref >60)
GFR calc non Af Amer: >60 mL/min (ref >60)
GFR calc non Af Amer: >60 mL/min (ref >60)
Glucose, Bld: 106 mg/dL — ABNORMAL HIGH (ref 70–99)
Glucose, Bld: 115 mg/dL — ABNORMAL HIGH (ref 70–99)
Potassium: 3.3 mEq/L — ABNORMAL LOW (ref 3.5–5.1)
Potassium: 3.7 mEq/L (ref 3.5–5.1)
Sodium: 136 mEq/L (ref 135–145)
Sodium: 139 mEq/L (ref 135–145)

## 2010-05-15 LAB — URINALYSIS, ROUTINE W REFLEX MICROSCOPIC
Bilirubin Urine: NEGATIVE
Glucose, UA: NEGATIVE mg/dL
Hgb urine dipstick: NEGATIVE
Ketones, ur: 15 mg/dL — AB
Nitrite: NEGATIVE
Protein, ur: NEGATIVE mg/dL
Specific Gravity, Urine: 1.014 (ref 1.005–1.030)
Urobilinogen, UA: 0.2 mg/dL (ref 0.0–1.0)
pH: 8 (ref 5.0–8.0)

## 2010-05-15 LAB — LIPASE, BLOOD: Lipase: 38 U/L (ref 11–59)

## 2010-05-16 LAB — DIFFERENTIAL
Basophils Absolute: 0 10*3/uL (ref 0.0–0.1)
Basophils Relative: 0 % (ref 0–1)
Eosinophils Absolute: 0 10*3/uL (ref 0.0–0.7)
Eosinophils Relative: 0 % (ref 0–5)
Lymphocytes Relative: 10 % — ABNORMAL LOW (ref 12–46)
Lymphs Abs: 1.1 10*3/uL (ref 0.7–4.0)
Monocytes Absolute: 0.2 10*3/uL (ref 0.1–1.0)
Monocytes Relative: 2 % — ABNORMAL LOW (ref 3–12)
Neutro Abs: 10 10*3/uL — ABNORMAL HIGH (ref 1.7–7.7)
Neutrophils Relative %: 88 % — ABNORMAL HIGH (ref 43–77)

## 2010-05-16 LAB — COMPREHENSIVE METABOLIC PANEL
ALT: 26 U/L (ref 0–35)
AST: 26 U/L (ref 0–37)
Albumin: 4.4 g/dL (ref 3.5–5.2)
Alkaline Phosphatase: 72 U/L (ref 39–117)
BUN: 13 mg/dL (ref 6–23)
CO2: 26 mEq/L (ref 19–32)
Calcium: 9.9 mg/dL (ref 8.4–10.5)
Chloride: 99 mEq/L (ref 96–112)
Creatinine, Ser: 0.7 mg/dL (ref 0.4–1.2)
GFR calc Af Amer: >60 mL/min (ref >60)
GFR calc non Af Amer: >60 mL/min (ref >60)
Glucose, Bld: 169 mg/dL — ABNORMAL HIGH (ref 70–99)
Potassium: 3.5 mEq/L (ref 3.5–5.1)
Sodium: 136 mEq/L (ref 135–145)
Total Bilirubin: 0.9 mg/dL (ref 0.3–1.2)
Total Protein: 7.6 g/dL (ref 6.0–8.3)

## 2010-05-16 LAB — CBC
HCT: 45.8 % (ref 36.0–46.0)
Hemoglobin: 14.9 g/dL (ref 12.0–15.0)
MCHC: 32.5 g/dL (ref 30.0–36.0)
MCV: 69.7 fL — ABNORMAL LOW (ref 78.0–100.0)
Platelets: 247 10*3/uL (ref 150–400)
RBC: 6.58 MIL/uL — ABNORMAL HIGH (ref 3.87–5.11)
RDW: 13.9 % (ref 11.5–15.5)
WBC: 11.3 10*3/uL — ABNORMAL HIGH (ref 4.0–10.5)

## 2010-05-16 LAB — LIPASE, BLOOD: Lipase: 29 U/L (ref 11–59)

## 2010-06-25 NOTE — Consult Note (Signed)
[name] Richards, CLAVEL NO.:  [id number]   MEDICAL RECORD NO.:  [id number]           PATIENT TYPE:  OUT   LOCATION:  GYN                          FACILITY:  [facility]   PHYSICIAN:  [name], M.D.DATE OF BIRTH:  April 01, 1945   DATE OF CONSULTATION:  03/29/2008  DATE OF DISCHARGE:                                 CONSULTATION   REFERRING PHYSICIANS:  1. Robert A. Nicholos Johns, M.D.  2. [name], M.D.  3. Dr. Reche Dixon, [facility].   CHIEF COMPLAINT:  Endometrial cancer, cervical cancer, lichen sclerosis  of the vulva.   INTERVAL HISTORY:  The patient returns today for annual exam.  Since her  last visit she has done well from a gynecologic point of view.  She  reports she has no symptoms in the vulva.  She has no pelvic pain,  pressure, vaginal bleeding or discharge.  She is no longer sexually  active, as she has broken up with her prior female companion.   She is being evaluated by Dr. Laverle Patter for an angiomyolipoma of the right  kidney.  This is asymptomatic.   The patient does use Temovate on rare occasion.   HISTORY OF PRESENT ILLNESS:  Poorly differentiated endometrial carcinoma  and endocervical carcinoma, undergoing initial TAH-BSO pelvic and  periaortic lymphadenectomy in April 01, 1995.  She received  postoperative pelvic radiation therapy.  She has done well since that  time, with no evidence of recurrent disease.  Subsequently, she  developed lichen sclerosis.   CURRENT MEDICATIONS:  1. Aspirin.  2. Hydrochlorothiazide.  3. Folic acid.  4. Levo thyroid.  5. Multivitamins.  6. Pro Optic ointment.  7. Simvastatin.  8. Stool softener with laxative.  9. Trazodone.  10.Tums.  11.Vitamin C and vitamin D.   DRUG ALLERGIES:  CODEINE and DERIVATIVES, COMPAZINE TABLETS,  PENICILLINS.   FAMILY HISTORY:  Negative for gynecologic, breast or colon cancers.   SOCIAL HISTORY:  The patient is divorced.  She is retired.  She does not  smoke.   OBSTETRICAL HISTORY:  Gravida 2.   REVIEW OF SYSTEMS:  A 10-point comprehensive review of systems was  negative except as noted above.   PHYSICAL EXAMINATION:  Weight 145 pounds (down from 160 pounds last  year).  GENERAL:  The patient is a healthy white female in no acute distress.  HEENT:  Negative.  NECK:  Supple without thyromegaly.  There is no supraclavicular or  inguinal adenopathy.  BREASTS:  Without masses, discharge or skin changes.  ABDOMEN:  Soft, nontender.  No mass, organomegaly, ascites or hernias  are noted.  PELVIC EXAM:  EG BUS, vagina, bladder and urethra are  normal.  Cervix and uterus are surgically absent.  Adnexa without  masses.  Rectovaginal exam confirms.   Pap smears were obtained.   IMPRESSION:  Endometrial and cervical cancer.  No evidence of recurrent  disease.  Lichen sclerosis relatively stable at the present time.  Not  on active treatment.   TREATMENT PLAN:  Pap smears were obtained.  The patient will return to  see me in 1 year for continuing  follow-up.      [name], M.D.  Electronically Signed     DC/MEDQ  D:  03/29/2008  T:  03/29/2008  Job:  [id number]   cc:   Dr. Reche Dixon, M.D.  [facility]  [phone]. [address]  [place], [place] [address]   [name], MD  Fax: [phone]   Elana Alm. Nicholos Johns, M.D.  Fax: [phone]

## 2010-06-25 NOTE — Consult Note (Signed)
[name], [name] NO.:  [id number]   MEDICAL RECORD NO.:  [id number]          PATIENT TYPE:  OUT   LOCATION:  GYN                          FACILITY:  [facility]   PHYSICIAN:  [name], M.D.DATE OF BIRTH:  1945/10/02   DATE OF CONSULTATION:  03/31/2007  DATE OF DISCHARGE:                                 CONSULTATION   CHIEF COMPLAINT:  Endometrial cancer, cervical cancer, lichen sclerosis  of the vulva, lichen planus of the vulva.   INTERVAL HISTORY:  Since her last visit, the patient has done reasonably  well from a gynecologic point of view, although she is having increasing  vulvar pain.  In the interim she developed oral lichen planus and was  treated by Dr. Reche Richards at [facility].  This was quite successful.  Dr. Reche Richards also evaluated her vulvar lesion  and took a biopsy.  I do not have that biopsy report.  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  She comes today with several prescriptions by Dr. Reche Richards to  get my approval before filling them and initiating treatment.   She has been using Temovate over the past year with only moderately good  results, and her symptoms seem to be worsening.   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  She underwent TAH-BSO, pelvic and cardiac  lymphadenectomy February of 1997.  She received postoperative pelvic  radiation therapy and has done well since that time with no evidence of  recurrent disease.   She subsequently developed lichen sclerosis.   CURRENT MEDICATIONS:  Synthroid, Zocor, multivitamins, vitamin E, Tums,  antihypertensive, Temovate p.r.n.   FAMILY HISTORY:  Negative for gynecologic, breast or colon cancer.   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  She has a new  relationship and is divorced.  She does not smoke.   PAST SURGICAL HISTORY:   TAH-BSO, pelvic and periaortic lymphadenectomy.   Back surgery for a spine tumor.   REVIEW OF SYSTEMS:  A 10-point comprehensive review of systems negative  except as noted above.   OBSTETRICAL HISTORY:  Gravida 2.   PHYSICAL EXAM:  Weight 160 pounds.  GENERAL:  Patient is a healthy white female in no acute distress.  HEENT:  Negative.  NECK:  Supple without thyromegaly.  There is no supraclavicular or  inguinal adenopathy.  ABDOMEN:  Soft, nontender.  No mass, organomegaly, ascites or hernia is  noted.  PELVIC:  EG/BUS shows peripheral changes of lichen sclerosis.  However,  on the medial aspects in the region of where the labia minora would be  located is a very erythematous, thin area more consistent with lichen  planus.  The vagina is otherwise atrophic.  Cervix and uterus are  surgically absent.  Adnexa without masses.  Rectovaginal exam confirms.  LOWER EXTREMITIES:  Without edema or varicosities.   Endometrial and endocervical carcinoma, 1997, no evidence recurrent  disease.  Pap smears were obtained.   Vulvar cutaneous changes consistent with lichen sclerosis and  lichen  planus allegedly based on recently-performed biopsy.  With regard to  recommended treatment by Dr. Reche Richards of using low-dose methotrexate, I  am supportive.  I do not believe this would complicate her other  gynecologic problems and if it offers her some opportunity to do better,  that would be wonderful.   It is noted that the patient had mammograms recently that were negative.  She will return to see me in 1 year for continuing follow-up.      [name], M.D.  Electronically Signed     DC/MEDQ  D:  03/31/2007  T:  04/01/2007  Job:  [id number]   cc:   [name] Richards, R.N.  [address]  [place], [place] [address]   Dr. Reche Richards  [facility]  [place], [place]   [name] Richards, M.D.  Fax: [phone]

## 2010-06-28 NOTE — Consult Note (Signed)
[facility]  Patient:    [name] Richards, [name] Richards                         MRN: [id number] Proc. Date: 11/05/99 Attending:  Rande Brunt. Clarke-Pearson, M.D.                          Consultation Report  GYNECOLOGIC-ONCOLOGY CLINIC  [age]-year-old white female returns for continuing followup of a DD:  11/05/99 TD:  11/06/99 Job: [id number]

## 2010-06-28 NOTE — Consult Note (Signed)
[facility]  Patient:    [name] Richards, MUHS Visit Number: [id number] MRN: [id number]          Service Type: GON Location: GYN Attending Physician:  Jeannette Corpus Dictated by:   Rande Brunt. Clarke-Pearson, M.D. Proc. Date: 03/24/01 Admit Date:  03/24/2001                            Consultation Report  REASON FOR FOLLOWUP:  [age]-year-old white female returns for continuing followup of cervical carcinoma.  She underwent TAH/BSO, pelvic and aortic lymphadenectomy in February of 1997 for a poorly differentiated cervical cancer as well as an endometrial carcinoma.  She received postoperative pelvic radiation therapy.  She has been followed since that time with no evidence of recurrent disease.  Since her last visit, the patient has done well.  Her current problems include:  Lichen sclerosis:  This is stable and she is not using any Temovate over the past year.  She had screening mammography recently which was negative.  Patient has had a repeat bone density study which shows some bone loss but still is within the safe range and no pharmacologic intervention is recommended at this time.  REVIEW OF SYSTEMS:  Review of systems reveals no GI, GU, cardiovascular, pulmonary or neurologic symptoms.  FAMILY HISTORY:  Reviewed and unchanged.  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  PHYSICAL EXAMINATION:  VITAL SIGNS:  Weight is 176 pounds (stable).  Blood pressure 140/82.  GENERAL:  Patient is a healthy white female in no acute distress.  HEENT:  Negative.  NECK:  Supple without thyromegaly.  BREASTS:  Without masses, discharge or skin changes.  ABDOMEN:  Soft and nontender.  No masses, organomegaly, ascites or herniae are noted.  NODES:  There is no supraclavicular, axillary or inguinal adenopathy.  PELVIC:  EGBUS shows changes of  lichen sclerosis; this is stable.  Vagina is normal but slightly atrophic.  No lesions are noted.  Bimanual and rectovaginal exam reveal no masses, induration or nodularity.  IMPRESSION: 1. Metastatic cervical cancer with no evidence of recurrent disease, now with    six years of followup.  Pap smears are obtained. 2. Lichen sclerosis, which is stable and asymptomatic. 3. Vaginal atrophy, which is stable. 4. Normal well-woman visit/exam findings of a normal mammogram and normal    bone density study.  The patient is scheduled to have screening colonoscopy    next week.  PLAN:  The patient will return in one year for continuing followup. Dictated by:   Rande Brunt. Clarke-Pearson, M.D. Attending Physician:  Jeannette Corpus DD:  03/24/01 TD:  03/25/01 Job: [id number]

## 2010-06-28 NOTE — Procedures (Signed)
[facility]  Patient:    [name] Richards, [name] Richards Visit Number: [id number] MRN: [id number]          Service Type: END Location: ENDO Attending Physician:  Charna Elizabeth Dictated by:   Anselmo Rod, M.D. Proc. Date: 04/14/01 Admit Date:  04/14/2001   CC:         Reuben Likes, M.D.   Procedure Report  DATE OF BIRTH:  05/11/1945.  REFERRING PHYSICIAN:  Reuben Likes, M.D.  PROCEDURE PERFORMED:  Colonoscopy.  ENDOSCOPIST:  Anselmo Rod, M.D.  INSTRUMENT USED:  Olympus video pediatric colonoscope.  INDICATIONS FOR PROCEDURE:  Rectal bleeding in a 65 year old white female with a history of endometrial carcinoma.  Rule out colonic polyps, masses, etc.  PREPROCEDURE PREPARATION:  Informed consent was procured from the patient. The patient was fasted for eight hours prior to the procedure and prepped with a bottle of magnesium citrate and a gallon of NuLytely the night prior to the procedure.  PREPROCEDURE PHYSICAL:  The patient had stable vital signs.  Neck supple. Chest clear to auscultation.  S1, S2 regular.  Abdomen soft with normal bowel sounds.  DESCRIPTION OF PROCEDURE:  The patient was placed in the left lateral decubitus position and sedated with 70 mg of Demerol and 7 mg of Versed intravenously.  Once the patient was adequately sedated and maintained on low-flow oxygen and continuous cardiac monitoring, the Olympus video colonoscope was advanced from the rectum to the cecum without difficulty. Except for small internal and external hemorrhoid, no other abnormalities were noted.  No masses or polyps were present.  The procedure was complete up to the cecum.  The appendicular orifice and ileocecal valve were clearly visualized and photographed.  IMPRESSION:  Normal colonoscopy except for small internal and external hemorrhoid.  No masses or polyps seen.  RECOMMENDATIONS: 1. Increase fluid and fiber in the diet. 2. Stool  softeners if needed. 3. Outpatient follow-up in the next two weeks.  Further recommendation will    be made at that time.Dictated by:   Anselmo Rod, M.D. Attending Physician:  Charna Elizabeth DD:  04/14/01 TD:  04/14/01 Job: [id number]

## 2010-06-28 NOTE — Consult Note (Signed)
NAMEMERLIN, EGE                          ACCOUNT NO.:  [id number]   MEDICAL RECORD NO.:  [id number]                   PATIENT TYPE:  OUT   LOCATION:  GYN                                  FACILITY:  [facility]   PHYSICIAN:  [name], M.D.         DATE OF BIRTH:  1945-03-09   DATE OF CONSULTATION:  03/15/2003  DATE OF DISCHARGE:                                   CONSULTATION   REASON FOR CONSULTATION:  A 65 year old white female returns for continuing  followup of cervical cancer.   INTERVAL HISTORY:  Since her last visit, the patient has done well.  She  denies any GI or GU symptoms, has no pelvic pain, pressure, vaginal bleeding  or discharge.  She reports she had mammograms last month which were normal,  and she has no breast symptoms.  She has also had a bone density study just  recently which shows minimal additional bone loss and no increased fracture  risk.   HISTORY OF PRESENT ILLNESS:  The patient underwent a total abdominal  hysterectomy, bilateral salpingo-oophorectomy, pelvic peri-aortic  lymphadenectomy in February 1997, for what turned out to be a poorly  differentiated cervical carcinoma and concurrent endometrial cancer.  She  received postoperative radiation therapy.  She has had no sequela since  then.   The patient also has had liken sclerosis and uses Temovate on rare occasion;  she is really essentially asymptomatic.   FAMILY HISTORY:  Reviewed and unchanged.   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  She exercises regularly.   PAST SURGICAL HISTORY:  1. Total abdominal hysterectomy, bilateral salpingo-oophorectomy.  2. 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   REVIEW OF SYSTEMS:  Negative except as noted above.   PHYSICAL EXAMINATION:  VITAL SIGNS:  Weight 163 pounds (stable from last  year).  GENERAL:  The patient is a healthy white female in no acute distress.  HEENT:  Negative.  NECK:  Supple  without thyromegaly.  There is no supraclavicular or inguinal  adenopathy.  BREASTS:  Without masses, discharge, or skin changes.  ABDOMEN:  Soft, nontender, no mass, organomegaly, ascites, or hernias are  noted.  PELVIC:  EGBUS, vagina, bladder, and urethra are normal except for some  minimal skin changes consistent with liken sclerosis.  There are no other  lesions noted.  The vagina is atrophic, but clean and well supported.  Bimanual and rectovaginal exam reveal no masses, induration, or nodularity.  A Pap smear is obtained.   IMPRESSION:  1. Cervical and endometrial cancer, now with eight years of followup with no     evidence of recurrent disease.  Pap smears are repeated today.  2. Liken sclerosis, stable, requiring minimal treatment with Temovate as     needed.  3. Vaginal atrophy which is stable.   PLAN:  The patient will return in one year.  She will have annual  mammograms.  I reviewed her bone density study with her, and have not  recommended any additional management at this time.  She is using calcium  and exercises very regularly.                                               [name], M.D.    DC/MEDQ  D:  03/15/2003  T:  03/15/2003  Job:  [id number]   cc:   Reuben Likes, M.D.  [address]  [place]  [place] [address]  Fax: [phone]   Telford Nab, R.N.  [address]  [place], [place] [address]

## 2010-06-28 NOTE — Consult Note (Signed)
[facility]  Patient:    [name] Richards, [name] Richards                       MRN: [id number] Proc. Date: 11/05/99 Adm. Date:  [id number] Attending:  Jeannette Corpus CC:         Maryln Gottron, M.D.  Reuben Likes, M.D.  Daniel L. Eda Paschal, M.D.  Telford Nab, R.N.   Consultation Report  HISTORY:  This 65 year old white female returns for continuing follow up of a poorly differentiated cervical carcinoma.  She had a coincidental endometrial carcinoma treated by TAH/BSO, pelvic and periaortic lymphadenectomy in February 1997.  She received postoperative radiation therapy, which was completed in November 1997.  She has been followed since that time with no evidence of recurrent disease.  Since her last visit, she has had no GI or GU symptoms.  She denies any pelvic pain or pressure, vaginal bleeding or discharge.  The patient has been using Effexor for hot flashes, and has had good relief, using Effexor SR 75 mg, one q.h.s.  She has required some additional antihypertensive medications in the interim.  She uses Temovate on rare occasions for vulvitis.  She denies any vulvar symptoms at the present time.  REVIEW OF SYSTEMS:  No cardiovascular or pulmonary disease.  FAMILY HISTORY:  Notable for the patients son who died recently at the age of 1 of coronary artery disease.  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  PHYSICAL EXAMINATION:  VITAL SIGNS:  Weight 172 pounds (stable), blood pressure 168/90.  GENERAL:  Healthy white female in no acute distress.  HEENT:  Negative.  NECK:  Supple without thyromegaly.  LYMPH:  There is no supraclavicular or inguinal adenopathy.  BREASTS:  Without masses, discharge or skin changes.  There is no axillary adenopathy.  ABDOMEN:  Soft and nontender.  No mass, organomegaly, ascites or hernias  are noted.  PELVIC:  EG/BUS appears relatively normal.  There are some atrophic changes on the posterior introitus.  The vagina is atrophic but clean and well supported. No lesions are noted.  Bimanual and rectovaginal exam reveal no masses, induration or nodularity.  EXTREMITIES:  The lower extremities are without edema or varicosities.  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  No evidence of recurrent disease.  Pap smears are pending.  Vulvitis under good control.  The patient continues on Temovate p.r.n., but is instructed to never use more than ten days in a row.  She will continue to use Effexor for hot flashes.  She will obtain mammograms in October for her annual study.  I have advised her to obtain bone density studies every three years. DD:  11/05/99 TD:  11/05/99 Job: [id number]

## 2010-06-28 NOTE — Consult Note (Signed)
[facility]  Patient:    [name] Richards, [name] Richards                       MRN: [id number] Proc. Date: 06/23/00 Adm. Date:  [id number] Disc. Date: [id number] Attending:  Jeannette Corpus CC:         Telford Nab, R.N.   Consultation Report  HISTORY OF PRESENT ILLNESS:  [age]-year-old white female presents today complaining of a new lesion on her vulva which she first noticed on Saturday. She has been using Temovate as needed for symptoms of lichen sclerosis.  On Saturday, she noted a 1 x 1/2-inch hard area on her left vulva.  Over the last two days, this has softened and is less pruritic, although there is some pain associated with it.  She denies any bleeding or drainage.  Otherwise, she has had no new gynecologic symptoms.  PHYSICAL EXAMINATION:  ABDOMEN:  Soft and nontender.  No mass, organomegaly, ascites or herniae are noted.  PELVIC:  EGBUS show that the right labia majora has an area of slight excoriation measuring approximately 2 x 1 cm.  Palpation in this area reveals slight thickening without any fixation and there is no purulent discharge or bleeding noted.  Remainder of the vulva appears normal, although she has agglutination of the clitoral hood secondary to her lichen sclerosis.  IMPRESSION:  Most likely this is an area that has been traumatized or slightly infected from folliculitis or some other break in the skin.  According to the patients history, this is improving over the past 48 hours.  It does not look typical for vulvar carcinoma or carcinoma in situ.  RECOMMENDATION:  Recommend the patient use a warm-water Sitz bath twice a day for the next week.  If her symptoms are not improving, we will reconsider the possibility of obtaining a biopsy in approximately one week.  She will contact us if she has any worsening of symptoms or is not improving. DD:  06/23/00 TD:  06/23/00 Job: [id number]

## 2010-06-28 NOTE — Consult Note (Signed)
[name] Richards, [name] Richards NO.:  [id number]   MEDICAL RECORD NO.:  [id number]          PATIENT TYPE:  OUT   LOCATION:  GYN                          FACILITY:  [facility]   PHYSICIAN:  [name], M.D.DATE OF BIRTH:  10/10/1945   DATE OF CONSULTATION:  04/10/2006  DATE OF DISCHARGE:                                 CONSULTATION   CHIEF COMPLAINT:  1. Endometrial cancer.  2. Cervical cancer.  3. Lichen sclerosis of the vulva.   INTERVAL HISTORY:  Patient returns today for annual examination and  followup of the above-noted problems.  She denies any pelvic pain,  pressure, vaginal bleeding or discharge.  She noticed that her lichen  sclerosis seems to be much improved and she has not used any Temovate  for a number of months.  She has recently been able to have sexual  intercourse using Astroglide as a lubricant.  She has no other GI or GU  symptoms, she had mammograms recently which were negative.   The patient continues to work out consistently approximately 5 days a  week.   HISTORY OF PRESENT ILLNESS:  The patient had a poorly differentiated  carcinoma of the endometrium and endocervix, undergoing TAH, BSO, pelvic  and paraaortic lymphadenectomy in February, 1997.  She received  postoperative pelvic radiation therapy and has done well since that time  with no evidence of recurrent disease.   Subsequently, she developed lichen sclerosis which is well controlled  using Temovate.   CURRENT MEDICATIONS:  Synthroid, Zocor, multivitamins, Vitamin E, Tums,  antihypertensive medication, Temovate p.r.n.  The patient recently has  had an increased dose of Vitamin D based on bone density study showing  potential for early osteoporosis in her lumbar spine.   FAMILY HISTORY:  Negative for gynecologic breast or colon cancer.   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  She is divorced but in a new relationship which  she is  happy with.  She does not smoke.   PAST SURGICAL HISTORY:  1. TAH/BSO.  2. Pelvic and paraaortic lymphadenopathy, 1997.  3. Back surgery for benign spine tumor.   REVIEW OF SYSTEMS:  A 10-point comprehensive review of systems negative  except as noted above.   OBSTETRICAL HISTORY:  Gravida 2.   PHYSICAL EXAM:  Weight 161 pounds, blood pressure 138/80, pulse 80,  respiratory rate 20.  GENERAL:  The patient is a healthy white female in no acute distress.  HEENT:  Negative.  NECK:  Supple without thyromegaly.  There is no supraclavicular or  inguinal adenopathy.  BREASTS:  Without masses, discharge or skin changes.  ABDOMEN:  Soft, nontender, no mass or organomegaly, ascites or hernias  are noted.  PELVIC:  EG/BUS.  Has minimal lichen sclerosis changes.  There is  agglutination of the clitoral hood, the introitus is normal but slightly  atrophic, the vagina is atrophic and slightly foreshortened, no lesions  are noted.  Pap smears are obtained.  BIMANUAL AND RECTOVAGINAL:  Reveal radiation fibrosis, no masses are  noted.  LOWER EXTREMITIES:  Without edema or varicosities.   IMPRESSION:  1. Cervical and endometrial cancer 1997, no evidence of recurrent      disease.  2. Lichen sclerosis which is stable.  3. Vaginal atrophy.   PLAN:  Pap smears are obtained.  The patient is given a prescription for  Premarin vaginal cream 0.5 g three times a week.  She will return to see  me in 1 year for continuing followup.      [name], M.D.  Electronically Signed     DC/MEDQ  D:  04/10/2006  T:  04/10/2006  Job:  [id number]   cc:   Telford Nab, R.N.  [address]  [place], [place] [address]   Reuben Likes, M.D.  Fax: [phone]

## 2010-06-28 NOTE — Consult Note (Signed)
[name] Richards, TOWELL NO.:  [id number]   MEDICAL RECORD NO.:  [id number]          PATIENT TYPE:  OUT   LOCATION:  GYN                          FACILITY:  [facility]   PHYSICIAN:  [name], M.D.DATE OF BIRTH:  29-Mar-1945   DATE OF CONSULTATION:  03/21/2005  DATE OF DISCHARGE:                                   CONSULTATION   GYNECOLOGY/ONCOLOGY CLINIC   CHIEF COMPLAINT:  Endometrial and cervical cancer, lichen sclerosis of the  vulva.   INTERVAL HISTORY:  Since her last visit, the patient has done well.  She has  been on a diet in hopes of losing weight so that she can wear a special  dress to her son's wedding planned for July of this year.  She works out 7  days a week.   She recently had mammograms which were negative.  She uses Temovate as  needed for her vulvar symptoms.  She reports that over the past year, she  has used this on two occasions.  Otherwise, she denies any pelvic pain,  pressure, vaginal bleeding or discharge.  She has no GI/GU symptoms.   HISTORY OF PRESENT ILLNESS:  The patient had a poorly-differentiated  carcinoma of both the endometrium and endocervix in February 1997.  She  underwent TAH/BSO, pelvic and periaortic lymphadenectomy and received  postoperative radiation therapy.  She has done well since then, but no  evidence of recurrent disease over the past 10 years.   Subsequent to that, the patient developed lichen sclerosis which is well-  controlled using Temovate.   CURRENT MEDICATIONS:  Synthroid, Zocor, multivitamin, Vitamin E, Tums,  antihypertensive medication as well as Temovate p.r.n.   FAMILY HISTORY:  Negative for gynecologic, breast or colon cancer.   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  She is divorced.  She does not smoke.   PAST SURGICAL HISTORY:  1.  TAH/BSO.  2.  Pelvic and periaortic lymphadenectomy in 1997.  3.  Back surgery for benign spine  tumor.   REVIEW OF SYSTEMS:  Ten-point comprehensive review of systems negative  except as noted above.   PHYSICAL EXAMINATION:  VITAL SIGNS:  Weight 161 pounds (down 7.5 pounds from  last year), blood pressure 120/70.  GENERAL:  The patient is a healthy, white female in no acute distress.  HEENT:  Negative.  NECK:  Supple without thyromegaly.  There is no supraclavicular, axillary or  inguinal adenopathy.  BREASTS:  Without mass, discharge or skin changes.  ABDOMEN:  Soft, nontender.  No masses, organomegaly, ascites or hernias  noted.  PELVIC:  EG/BUS shows some atrophic changes and changes consistent with  lichen sclerosis.  There are no lesions noted.  The vagina is slightly  atrophic, well-supported.  Again, no lesions are noted.  Bimanual and  rectovaginal exam reveal no masses, induration or nodularity.  EXTREMITIES:  Lower extremities without edema or varicosities.   IMPRESSION:  1.  Poorly-differentiated endocervical and endometrial carcinoma now 10      years since surgery and postoperative radiation therapy.  No evidence      for recurrent disease.  2.  Lichen sclerosis, which is stable.   PLAN:  Pap smear is obtained.  The patient will return in 1 year and have  annual mammograms.  She will be scheduled to have a repeat bone density  study again in January 2008.      [name], M.D.  Electronically Signed     DC/MEDQ  D:  03/21/2005  T:  03/22/2005  Job:  [id number]   cc:   Reuben Likes, M.D.  Fax: [phone]   Telford Nab, R.N.  [address]  [place], [place] [address]

## 2010-06-28 NOTE — Procedures (Signed)
[facility]  Patient:    [name] Richards, [name] Richards Visit Number: [id number] MRN: [id number]          Service Type: END Location: ENDO Attending Physician:  Charna Elizabeth Dictated by:   Anselmo Rod, M.D. Proc. Date: 04/25/01 Admit Date:  04/14/2001 Discharge Date: 04/14/2001   CC:         Reuben Likes, M.D.   Procedure Report  DATE OF BIRTH:  15-Jun-1945  REFERRING PHYSICIAN:  Reuben Likes, M.D.  PROCEDURE PERFORMED:  Colonoscopy.  ENDOSCOPIST:  Anselmo Rod, M.D.  INSTRUMENT USED:  Olympus video pediatric adjustable colonoscope.  INDICATIONS FOR PROCEDURE:  The patient is a 65 year old white female with a history of endometrial and cervical cancer with occasional blood in stool. Rule out colonic polyps, masses, hemorrhoids, etc.  PREPROCEDURE PREPARATION:  Informed consent was procured from the patient. The patient was fasted for eight hours prior to the procedure and prepped with a bottle of magnesium citrate and a gallon of NuLytely the night prior to the procedure.  PREPROCEDURE PHYSICAL:  The patient had stable vital signs.  Neck supple. Chest clear to auscultation.  S1, S2 regular.  Abdomen soft with normal bowel sounds.  DESCRIPTION OF PROCEDURE:  The patient was placed in the left lateral decubitus position and sedated with 70 mg of Demerol and 7 mg of Versed intravenously.  Once the patient was adequately sedated and maintained on low-flow oxygen and continuous cardiac monitoring, the Olympus video colonoscope was advanced from the rectum to the cecum without difficulty. Except for small internal and external hemorrhoids, no other abnormalities were seen.  No masses, polyps, erosions, ulcerations or diverticula were present.  The entire colonic mucosa up to the cecum appeared healthy and without lesions.  IMPRESSION: 1. Normal colonoscopy except for small nonbleeding internal and external    hemorrhoids. 2. No  masses or polyps seen.  RECOMMENDATIONS:  A high fiber diet has been recommended for the patient and repeat colorectal cancer screening is recommended in the next five years unless the patient were to develop any abnormal symptoms in the interim. Dictated by:   Anselmo Rod, M.D. Attending Physician:  Charna Elizabeth DD:  04/25/01 TD:  04/26/01 Job: [id number]

## 2010-06-28 NOTE — Consult Note (Signed)
NAMENIREL, BABLER NO.:  [id number]   MEDICAL RECORD NO.:  [id number]          PATIENT TYPE:  OUT   LOCATION:  GYN                          FACILITY:  [facility]   PHYSICIAN:  [name], M.D.DATE OF BIRTH:  10-Oct-1945   DATE OF CONSULTATION:  03/08/2004  DATE OF DISCHARGE:                                   CONSULTATION   CHIEF COMPLAINT:  Cervical and endometrial cancer.   INTERVAL HISTORY:  Since her last visit the patient has done well.  She has  developed increasing difficulty with cataracts and is scheduled to have  cataract surgery in the near future.  From a gynecologic point of view she  has had no pelvic pain, pressure, vaginal bleeding, or discharge.  She has  lichen sclerosis and uses Temovate on rare occasion.  Actually, she says she  has only used it about three times this past year.   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  Following surgical resection and staging including  lymphadenectomy she received postoperative radiation therapy.  She has done  well since that time.   The patient also has lichen sclerosis well controlled using Temovate.   FAMILY HISTORY:  Reviewed and unchanged.   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  She does  not smoke.   PAST SURGICAL HISTORY:  1.  TAH/BSO, pelvic and periaortic lymphadenectomy.  2.  Back surgery for benign spine tumor.   REVIEW OF SYSTEMS:  Negative except as noted above.  The patient had  mammograms this past year which were negative.   PHYSICAL EXAMINATION:  VITAL SIGNS:  Weight 168.5 pounds, blood pressure  148/82.  GENERAL:  The patient is a healthy white female in no acute distress.  HEENT:  Negative.  NECK:  Supple without thyromegaly.  LYMPH:  There is no supraclavicular or inguinal adenopathy.  BREASTS:  Without mass, discharge,  or skin changes.  ABDOMEN:  Soft, nontender.  No mass, organomegaly, ascites, or hernias are  noted.  PELVIC:  EGBUS shows minimal changes from lichen sclerosis.  Actually, her  skin looks very good today.  The vagina is slightly atrophic, well  supported.  No lesions are noted.  Bimanual and rectovaginal examination  reveal no masses, induration, or nodularity.   IMPRESSION:  Cervical and endometrial cancer now nine years since follow-up.  No evidence of recurrent disease.  Pap smears are repeated.  Lichen  sclerosis which is stable.  Patient is given renewed prescription for  Temovate.   PLAN:  The patient will continue to have annual mammograms.  Pap smears are  obtained today.  She will return to see [place] in one year for continuing follow-  up.      DC/MEDQ  D:  03/08/2004  T:  03/08/2004  Job:  [id number]   cc:   Telford Nab, R.N.  [address]  [place], [place] [address]

## 2010-06-28 NOTE — Consult Note (Signed)
[facility]  Patient:    [name] Richards, [name] Richards                           MRN: [id number] Proc. Date: 03/31/00 Attending:  Rande Brunt. Clarke-Pearson, M.D. CC:         Rande Brunt. Eda Paschal, M.D.  Maryln Gottron, M.D.  Reuben Likes, M.D.  Telford Nab, R.N.   Consultation Report  HISTORY:  [age]-year-old white female returns for continuing followup of a poorly differentiated cervical carcinoma and endometrial carcinoma treated with TAH/BSO, pelvic and para-aortic lymphadenectomy in February 1997. Postoperatively, she received whole-pelvis radiation therapy which was completed in November of 1997.  She has been followed since that time with no evidence of recurrent disease.  The patient has had some recurring problems with lichen sclerosis of the vulva which she has treated periodically with Temovate.  She claims she uses Temovate approximately three times a year and feels that her lichen sclerosis is under good control.  She is using Effexor SR 75 mg q.h.s. for relief of hot flushes and having reasonably good results from that.  REVIEW OF SYSTEMS:  Review of systems revealed no cardiovascular, pulmonary, neurologic, GI or GU symptoms.  FAMILY HISTORY:  Family history is unchanged.  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  PHYSICAL EXAMINATION  GENERAL:  Weight is 177 pounds.  The patient is a healthy, pleasant white female in no acute distress.  HEENT:  Negative.  NECK:  Supple without thyromegaly.  LYMPHATICS:  There is no supraclavicular or inguinal adenopathy.  ABDOMEN:  Soft and nontender.  No mass, organomegaly, ascites or herniae are noted.  Her incision is well-healed.  PELVIC:  EG/BUS show minimal changes of lichen sclerosis; this is actually much improved from prior exams.  The vagina on the other hand is very atrophic but no lesions are noted.  Bimanual and rectovaginal exam revealed no  masses, induration or nodularity.  IMPRESSION 1. Metastatic cervical cancer, no evidence of recurrent disease, now with    five years of followup. 2. Lichen sclerosis, stable on Temovate. 3. Vaginal atrophy and dyspareunia.  The patient was given a prescription for    Vagifem to be used one tablet twice a week at bedtime; alternatively, the    patient may try Estrace.  She previously did not tolerate Premarin.  PLAN:  She will return to see me at her annual exam. DD:  04/01/00 TD:  04/02/00 Job: [id number]

## 2010-06-28 NOTE — Consult Note (Signed)
NAME:  [name] Richards, [name] Richards                          ACCOUNT NO.:  [id number]   MEDICAL RECORD NO.:  [id number]                   PATIENT TYPE:  OUT   LOCATION:  GYN                                  FACILITY:  [facility]   PHYSICIAN:  [name], M.D.         DATE OF BIRTH:  [birth year]   DATE OF CONSULTATION:  04/06/2002  DATE OF DISCHARGE:                                   CONSULTATION   REASON FOR CONSULTATION:  The patient is a 65 year old white female who  returns for continuing followup of cervical cancer.  She underwent a total  abdominal hysterectomy, bilateral salpingo-oophorectomy, pelvic and peri-  aortic lymphadenectomy in 2/97.  She had a poorly differentiated cervical  cancer as well as a concurrent endometrial cancer.  She received  postoperative pelvic radiation therapy.  She has been followed with no  evidence of recurrent disease.   Since her last visit, the patient has done well.  She has been on a Weight  [employer] diet, and is happy with her recent weight loss of approximately 13  pounds over the past year.  She exercises five days a week.  She has lichen  sclerosis of the vulva which has been relatively asymptomatic and only  required treatment on two occasions over the past year.  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  She has had screening  mammograms recently which were negative.   REVIEW OF SYMPTOMS:  Essentially negative.   FAMILY HISTORY:  Reviewed and unchanged.   SOCIAL HISTORY:  Reviewed and unchanged.   PHYSICAL EXAMINATION:  VITAL SIGNS:  Weight 163 pounds (down 13 pounds from  2/03), blood pressure 142/82.  GENERAL:  The patient is a healthy white female in no acute distress.  HEENT:  Negative.  NECK:  Supple without thyromegaly.  There is no supraclavicular or inguinal  adenopathy.  BREASTS:  Without masses, discharge, or skin changes.  ABDOMEN:  Soft, nontender, no masses, organomegaly, ascites, or hernias are  noted.  PELVIC:  EGBUS, vagina, bladder, and urethra are normal.  Cervix and uterus  are surgically absent.  Bimanual and rectovaginal examination reveal no  masses, induration, or nodularity.   IMPRESSION:  1. Metastatic cervical cancer with no evidence of recurrent disease, now     with seven years of followup.  Pap smear is repeated.  2. Lichen sclerosis.  The patient has stable disease which is relatively     asymptomatic.  3. Vaginal atrophy which is stable.    PLAN:  We will plan on obtaining a repeat bone density study in 11/04.  She  will have annual mammograms.  She is given a new prescription for Temovate  0.05% to be applied b.i.d. if she has a flare of her vulvitis.  She will  return to see me in one year for continuing  followup.                                               [name], M.D.    DC/MEDQ  D:  04/06/2002  T:  04/06/2002  Job:  [id number]   cc:   Telford Nab, R.N.

## 2010-11-06 LAB — CBC
HCT: 43.4
Hemoglobin: 14.5
MCHC: 33.3
MCV: 68.6 — ABNORMAL LOW
Platelets: 268
RBC: 6.33 — ABNORMAL HIGH
RDW: 14.2
WBC: 12.8 — ABNORMAL HIGH

## 2010-11-06 LAB — COMPREHENSIVE METABOLIC PANEL
ALT: 21
AST: 21
Albumin: 4
Alkaline Phosphatase: 63
BUN: 10
CO2: 29
Calcium: 9.8
Chloride: 92 — ABNORMAL LOW
Creatinine, Ser: 0.78
GFR calc Af Amer: >60
GFR calc non Af Amer: >60
Glucose, Bld: 155 — ABNORMAL HIGH
Potassium: 3 — ABNORMAL LOW
Sodium: 135
Total Bilirubin: 1.1
Total Protein: 6.4

## 2010-11-06 LAB — DIFFERENTIAL
Basophils Absolute: 0
Basophils Relative: 0
Eosinophils Absolute: 0
Eosinophils Relative: 0
Lymphocytes Relative: 5 — ABNORMAL LOW
Lymphs Abs: 0.6 — ABNORMAL LOW
Monocytes Absolute: 0.8
Monocytes Relative: 6
Neutro Abs: 11.4 — ABNORMAL HIGH
Neutrophils Relative %: 89 — ABNORMAL HIGH

## 2010-11-06 LAB — LIPASE, BLOOD: Lipase: 32

## 2010-11-16 ENCOUNTER — Emergency Department (HOSPITAL_COMMUNITY)
Admission: EM | Admit: 2010-11-16 | Discharge: 2010-11-16 | Discharge status: Against Medical Advice | Payer: Medicare Other | Attending: Emergency Medicine | Admitting: Emergency Medicine

## 2011-02-19 ENCOUNTER — Inpatient Hospital Stay (HOSPITAL_COMMUNITY)
Admission: EM | Admit: 2011-02-19 | Discharge: 2011-02-21 | DRG: 389 | Disposition: A | Discharge status: Home / Self Care | Payer: Medicare Other | Attending: Internal Medicine | Admitting: Internal Medicine

## 2011-02-19 ENCOUNTER — Emergency Department (HOSPITAL_COMMUNITY): Payer: Medicare Other

## 2011-02-19 ENCOUNTER — Encounter (HOSPITAL_COMMUNITY): Payer: Self-pay

## 2011-02-19 DIAGNOSIS — R109 Unspecified abdominal pain: Secondary | ICD-10-CM | POA: Diagnosis present

## 2011-02-19 DIAGNOSIS — I10 Essential (primary) hypertension: Secondary | ICD-10-CM | POA: Diagnosis present

## 2011-02-19 DIAGNOSIS — K5000 Crohn's disease of small intestine without complications: Secondary | ICD-10-CM | POA: Diagnosis present

## 2011-02-19 DIAGNOSIS — Z8542 Personal history of malignant neoplasm of other parts of uterus: Secondary | ICD-10-CM

## 2011-02-19 DIAGNOSIS — K56609 Unspecified intestinal obstruction, unspecified as to partial versus complete obstruction: Principal | ICD-10-CM | POA: Diagnosis present

## 2011-02-19 DIAGNOSIS — Z905 Acquired absence of kidney: Secondary | ICD-10-CM

## 2011-02-19 DIAGNOSIS — Z8541 Personal history of malignant neoplasm of cervix uteri: Secondary | ICD-10-CM

## 2011-02-19 HISTORY — DX: Malignant neoplasm of endometrium: C54.1

## 2011-02-19 HISTORY — DX: Hemangioma of other sites: D18.09

## 2011-02-19 HISTORY — DX: Other complications of anesthesia, initial encounter: T88.59XA

## 2011-02-19 HISTORY — DX: Hyperlipidemia, unspecified: E78.5

## 2011-02-19 HISTORY — DX: Unspecified intestinal obstruction, unspecified as to partial versus complete obstruction: K56.609

## 2011-02-19 HISTORY — DX: Malignant neoplasm of cervix uteri, unspecified: C53.9

## 2011-02-19 HISTORY — DX: Essential (primary) hypertension: I10

## 2011-02-19 HISTORY — DX: Adverse effect of unspecified anesthetic, initial encounter: T41.45XA

## 2011-02-19 HISTORY — DX: Benign lipomatous neoplasm of kidney: D17.71

## 2011-02-19 HISTORY — DX: Disorder of thyroid, unspecified: E07.9

## 2011-02-19 LAB — POCT I-STAT, CHEM 8
BUN: 20 mg/dL (ref 6–23)
Calcium, Ion: 1.09 mmol/L — ABNORMAL LOW (ref 1.12–1.32)
Chloride: 98 mEq/L (ref 96–112)
Creatinine, Ser: 0.9 mg/dL (ref 0.50–1.10)
Glucose, Bld: 133 mg/dL — ABNORMAL HIGH (ref 70–99)
HCT: 43 % (ref 36.0–46.0)
Hemoglobin: 14.6 g/dL (ref 12.0–15.0)
Potassium: 3.9 mEq/L (ref 3.5–5.1)
Sodium: 136 mEq/L (ref 135–145)
TCO2: 28 mmol/L (ref 0–100)

## 2011-02-19 LAB — URINALYSIS, ROUTINE W REFLEX MICROSCOPIC
Bilirubin Urine: NEGATIVE
Glucose, UA: NEGATIVE mg/dL
Hgb urine dipstick: NEGATIVE
Ketones, ur: NEGATIVE mg/dL
Nitrite: NEGATIVE
Protein, ur: NEGATIVE mg/dL
Specific Gravity, Urine: 1.043 — ABNORMAL HIGH (ref 1.005–1.030)
Urobilinogen, UA: 0.2 mg/dL (ref 0.0–1.0)
pH: 7.5 (ref 5.0–8.0)

## 2011-02-19 LAB — COMPREHENSIVE METABOLIC PANEL
ALT: 24 U/L (ref 0–35)
AST: 22 U/L (ref 0–37)
Albumin: 4.2 g/dL (ref 3.5–5.2)
Alkaline Phosphatase: 90 U/L (ref 39–117)
BUN: 18 mg/dL (ref 6–23)
CO2: 26 mEq/L (ref 19–32)
Calcium: 10.1 mg/dL (ref 8.4–10.5)
Chloride: 94 mEq/L — ABNORMAL LOW (ref 96–112)
Creatinine, Ser: 0.69 mg/dL (ref 0.50–1.10)
GFR calc Af Amer: >90 mL/min (ref >90)
GFR calc non Af Amer: 89 mL/min — ABNORMAL LOW (ref >90)
Glucose, Bld: 131 mg/dL — ABNORMAL HIGH (ref 70–99)
Potassium: 3.7 mEq/L (ref 3.5–5.1)
Sodium: 134 mEq/L — ABNORMAL LOW (ref 135–145)
Total Bilirubin: 0.4 mg/dL (ref 0.3–1.2)
Total Protein: 7.4 g/dL (ref 6.0–8.3)

## 2011-02-19 LAB — CBC
HCT: 40.2 % (ref 36.0–46.0)
Hemoglobin: 13.2 g/dL (ref 12.0–15.0)
MCH: 21.7 pg — ABNORMAL LOW (ref 26.0–34.0)
MCHC: 32.8 g/dL (ref 30.0–36.0)
MCV: 66.1 fL — ABNORMAL LOW (ref 78.0–100.0)
Platelets: 224 10*3/uL (ref 150–400)
RBC: 6.08 MIL/uL — ABNORMAL HIGH (ref 3.87–5.11)
RDW: 14.2 % (ref 11.5–15.5)
WBC: 9.8 10*3/uL (ref 4.0–10.5)

## 2011-02-19 LAB — DIFFERENTIAL
Basophils Absolute: 0 10*3/uL (ref 0.0–0.1)
Basophils Relative: 0 % (ref 0–1)
Eosinophils Absolute: 0 10*3/uL (ref 0.0–0.7)
Eosinophils Relative: 0 % (ref 0–5)
Lymphocytes Relative: 9 % — ABNORMAL LOW (ref 12–46)
Lymphs Abs: 0.9 10*3/uL (ref 0.7–4.0)
Monocytes Absolute: 0.5 10*3/uL (ref 0.1–1.0)
Monocytes Relative: 5 % (ref 3–12)
Neutro Abs: 8.4 10*3/uL — ABNORMAL HIGH (ref 1.7–7.7)
Neutrophils Relative %: 86 % — ABNORMAL HIGH (ref 43–77)

## 2011-02-19 LAB — URINE MICROSCOPIC-ADD ON

## 2011-02-19 LAB — LACTIC ACID, PLASMA
Lactic Acid, Venous: 3 mmol/L — ABNORMAL HIGH (ref 0.5–2.2)
Lactic Acid, Venous: 1.7 mmol/L (ref 0.5–2.2)

## 2011-02-19 LAB — LIPASE, BLOOD: Lipase: 40 U/L (ref 11–59)

## 2011-02-19 MED ORDER — BENAZEPRIL HCL 20 MG PO TABS
20.0000 mg | ORAL_TABLET | Freq: Every day | ORAL | Status: DC
  Administered 2011-02-19 – 2011-02-21 (×3): 20 mg via ORAL
  Filled 2011-02-19 (×4): qty 1

## 2011-02-19 MED ORDER — CIPROFLOXACIN IN D5W 400 MG/200ML IV SOLN
400.0000 mg | Freq: Two times a day (BID) | INTRAVENOUS | Status: DC
  Administered 2011-02-19 – 2011-02-21 (×4): 400 mg via INTRAVENOUS
  Filled 2011-02-19 (×6): qty 200

## 2011-02-19 MED ORDER — ENOXAPARIN SODIUM 40 MG/0.4ML ~~LOC~~ SOLN
40.0000 mg | SUBCUTANEOUS | Status: DC
  Administered 2011-02-19: 40 mg via SUBCUTANEOUS
  Filled 2011-02-19 (×5): qty 0.4

## 2011-02-19 MED ORDER — LEVOTHYROXINE SODIUM 75 MCG PO TABS
75.0000 ug | ORAL_TABLET | Freq: Every day | ORAL | Status: DC
  Administered 2011-02-19 – 2011-02-21 (×3): 75 ug via ORAL
  Filled 2011-02-19 (×4): qty 1

## 2011-02-19 MED ORDER — KCL IN DEXTROSE-NACL 10-5-0.45 MEQ/L-%-% IV SOLN
INTRAVENOUS | Status: DC
  Administered 2011-02-19 – 2011-02-21 (×2): via INTRAVENOUS
  Filled 2011-02-19 (×7): qty 1000

## 2011-02-19 MED ORDER — ONDANSETRON HCL 4 MG/2ML IJ SOLN
4.0000 mg | Freq: Once | INTRAMUSCULAR | Status: AC
  Administered 2011-02-19: 4 mg via INTRAVENOUS
  Filled 2011-02-19: qty 2

## 2011-02-19 MED ORDER — DIPHENHYDRAMINE HCL 50 MG/ML IJ SOLN
50.0000 mg | Freq: Every evening | INTRAMUSCULAR | Status: DC | PRN
  Administered 2011-02-19: 50 mg via INTRAVENOUS
  Filled 2011-02-19: qty 1

## 2011-02-19 MED ORDER — ACETAMINOPHEN 325 MG PO TABS
650.0000 mg | ORAL_TABLET | Freq: Four times a day (QID) | ORAL | Status: DC | PRN
  Filled 2011-02-19: qty 2

## 2011-02-19 MED ORDER — MORPHINE SULFATE 4 MG/ML IJ SOLN
4.0000 mg | Freq: Once | INTRAMUSCULAR | Status: AC
  Administered 2011-02-19: 4 mg via INTRAVENOUS
  Filled 2011-02-19: qty 1

## 2011-02-19 MED ORDER — SIMVASTATIN 20 MG PO TABS
20.0000 mg | ORAL_TABLET | Freq: Every evening | ORAL | Status: DC
  Administered 2011-02-19 – 2011-02-20 (×2): 20 mg via ORAL
  Filled 2011-02-19 (×5): qty 1

## 2011-02-19 MED ORDER — METRONIDAZOLE IN NACL 5-0.79 MG/ML-% IV SOLN
500.0000 mg | Freq: Three times a day (TID) | INTRAVENOUS | Status: DC
  Administered 2011-02-19 – 2011-02-21 (×6): 500 mg via INTRAVENOUS
  Filled 2011-02-19 (×9): qty 100

## 2011-02-19 MED ORDER — BENAZEPRIL-HYDROCHLOROTHIAZIDE 20-12.5 MG PO TABS: 1.0000 | ORAL_TABLET | Freq: Every day | ORAL | Status: DC

## 2011-02-19 MED ORDER — ACETAMINOPHEN 650 MG RE SUPP: 650.0000 mg | Freq: Four times a day (QID) | RECTAL | Status: DC | PRN

## 2011-02-19 MED ORDER — MORPHINE SULFATE 2 MG/ML IJ SOLN: 1.0000 mg | INTRAMUSCULAR | Status: DC | PRN

## 2011-02-19 MED ORDER — SODIUM CHLORIDE 0.9 % IV BOLUS (SEPSIS)
1000.0000 mL | Freq: Once | INTRAVENOUS | Status: AC
  Administered 2011-02-19: 1000 mL via INTRAVENOUS

## 2011-02-19 MED ORDER — IOHEXOL 300 MG/ML  SOLN
100.0000 mL | Freq: Once | INTRAMUSCULAR | Status: AC | PRN
  Administered 2011-02-19: 100 mL via INTRAVENOUS

## 2011-02-19 MED ORDER — HYDROCHLOROTHIAZIDE 12.5 MG PO CAPS
12.5000 mg | ORAL_CAPSULE | Freq: Every day | ORAL | Status: DC
  Administered 2011-02-19 – 2011-02-21 (×3): 12.5 mg via ORAL
  Filled 2011-02-19 (×4): qty 1

## 2011-02-19 NOTE — ED Notes (Signed)
Hypoactive bowel sounds noted in RLQ but absent in LLQ. Slight amt of pain on palpation.  No Nausea , vomiting  No NG tube

## 2011-02-19 NOTE — ED Notes (Signed)
Pt helped to bathroom

## 2011-02-19 NOTE — ED Notes (Signed)
Patient aware of need for urine testing. Patient unable to void at this time. Patient encouraged to call for toileting assistance  

## 2011-02-19 NOTE — ED Notes (Signed)
HS meds given with sips of water requests benedryl for sleep

## 2011-02-19 NOTE — ED Notes (Signed)
Received from main ed until room assignment received

## 2011-02-19 NOTE — ED Provider Notes (Signed)
66yo F, c/o N/V, gen abd pain coming in "waves" since last night.  Has been assoc with loose stools.  Hx SBO.  Labs completed, CT pending on sign out from Dr. Hyacinth Meeker.  Past Medical History  Diagnosis Date  . Asthma   . Hypertension   . Thyroid disease   . Cervical cancer   . Endometrial cancer   . Hyperlipemia   . Angiomyolipoma of kidney   . Vertebral body hemangioma     T12  . Small bowel obstruction    Past Surgical History  Procedure Date  . Back surgery   . Tubal ligation   . Skin biopsy   . Abdominal hysterectomy   . Partial nephrectomy     right    Social History  . Marital Status: Divorced   Social History Main Topics  . Smoking status: Never Smoker   . Smokeless tobacco: Not on file  . Alcohol Use: No  . Drug Use: No  . Sexually Active:     Results for orders placed during the hospital encounter of 02/19/11  COMPREHENSIVE METABOLIC PANEL      Component Value Range   Sodium 134 (*) 135 - 145 (mEq/L)   Potassium 3.7  3.5 - 5.1 (mEq/L)   Chloride 94 (*) 96 - 112 (mEq/L)   CO2 26  19 - 32 (mEq/L)   Glucose, Bld 131 (*) 70 - 99 (mg/dL)   BUN 18  6 - 23 (mg/dL)   Creatinine, Ser 1.19  0.50 - 1.10 (mg/dL)   Calcium 14.7  8.4 - 10.5 (mg/dL)   Total Protein 7.4  6.0 - 8.3 (g/dL)   Albumin 4.2  3.5 - 5.2 (g/dL)   AST 22  0 - 37 (U/L)   ALT 24  0 - 35 (U/L)   Alkaline Phosphatase 90  39 - 117 (U/L)   Total Bilirubin 0.4  0.3 - 1.2 (mg/dL)   GFR calc non Af Amer 89 (*) >90 (mL/min)   GFR calc Af Amer >90  >90 (mL/min)  CBC      Component Value Range   WBC 9.8  4.0 - 10.5 (K/uL)   RBC 6.08 (*) 3.87 - 5.11 (MIL/uL)   Hemoglobin 13.2  12.0 - 15.0 (g/dL)   HCT 82.9  56.2 - 13.0 (%)   MCV 66.1 (*) 78.0 - 100.0 (fL)   MCH 21.7 (*) 26.0 - 34.0 (pg)   MCHC 32.8  30.0 - 36.0 (g/dL)   RDW 86.5  78.4 - 69.6 (%)   Platelets 224  150 - 400 (K/uL)  DIFFERENTIAL      Component Value Range   Neutrophils Relative 86 (*) 43 - 77 (%)   Lymphocytes Relative 9 (*) 12 -  46 (%)   Monocytes Relative 5  3 - 12 (%)   Eosinophils Relative 0  0 - 5 (%)   Basophils Relative 0  0 - 1 (%)   Neutro Abs 8.4 (*) 1.7 - 7.7 (K/uL)   Lymphs Abs 0.9  0.7 - 4.0 (K/uL)   Monocytes Absolute 0.5  0.1 - 1.0 (K/uL)   Eosinophils Absolute 0.0  0.0 - 0.7 (K/uL)   Basophils Absolute 0.0  0.0 - 0.1 (K/uL)   RBC Morphology MICROCYTES     WBC Morphology WHITE COUNT CONFIRMED ON SMEAR     Smear Review PLATELET COUNT CONFIRMED BY SMEAR    LACTIC ACID, PLASMA      Component Value Range   Lactic Acid, Venous 3.0 (*) 0.5 -  2.2 (mmol/L)  LIPASE, BLOOD      Component Value Range   Lipase 40  11 - 59 (U/L)  POCT I-STAT, CHEM 8      Component Value Range   Sodium 136  135 - 145 (mEq/L)   Potassium 3.9  3.5 - 5.1 (mEq/L)   Chloride 98  96 - 112 (mEq/L)   BUN 20  6 - 23 (mg/dL)   Creatinine, Ser 1.61  0.50 - 1.10 (mg/dL)   Glucose, Bld 096 (*) 70 - 99 (mg/dL)   Calcium, Ion 0.45 (*) 1.12 - 1.32 (mmol/L)   TCO2 28  0 - 100 (mmol/L)   Hemoglobin 14.6  12.0 - 15.0 (g/dL)   HCT 40.9  81.1 - 91.4 (%)  LACTIC ACID, PLASMA      Component Value Range   Lactic Acid, Venous 1.7  0.5 - 2.2 (mmol/L)   Ct Chest W Contrast 02/19/2011  *RADIOLOGY REPORT*  Clinical Data:  Abdominal pain, distention, nausea, vomiting, chills, history of partial right nephrectomy for angiomyolipoma, hysterectomy, cervical/endometrial cancer, follow-up lung nodule  CT CHEST, ABDOMEN AND PELVIS WITH CONTRAST  Technique:  Multidetector CT imaging of the chest, abdomen and pelvis was performed following the standard protocol during bolus administration of intravenous contrast.  Sagittal and coronal MPR images reconstructed from axial data set.  Contrast: OMNIPAQUE IOHEXOL 300 MG/ML IV SOLN.  Dilute oral contrast.  Comparison:  CT chest 09/10/2009, CT abdomen and pelvis 06/11/2007  CT CHEST  Findings: Thoracic vascular structures grossly patent on non dedicated exam. Minimal scattered coronary arterial calcification. No  thoracic adenopathy. Surgical clips left axilla question. Tiny right upper lobe pulmonary nodule image 19 stable. Minimal nodularity right major fissure image 29 stable. Interval decrease in size of right lower lobe nodule 4 x 2 mm image 38, previously 6 x 3 mm. Mild dependent atelectasis at lung bases. Lungs otherwise clear. No acute osseous findings. Skin mild scattered degenerative disc disease changes thoracic spine.  IMPRESSION: Tiny right lung pulmonary nodule stable since previous exam. Minimal dependent atelectasis at lung bases.  CT ABDOMEN AND PELVIS  Findings: Postsurgical deformity upper pole right kidney. Low attenuation focus laterally in spleen 10 x 8 mm image 61 unchanged. Small left renal cyst unchanged. Liver, spleen, pancreas, kidneys, and adrenal glands otherwise normal appearance. Retroperitoneal surgical clips extending into pelvis bilaterally. Small amount nonspecific free pelvic fluid. Uterus surgically absent with nonvisualization of the ovaries and appendix.  Dilatation of the mid small bowel loops with normal caliber terminal ileum. Findings are compatible with distal small bowel obstruction.  Mild bowel wall thickening of a few distal small bowel loops, nonspecific. Colon and stomach decompressed and unremarkable. No mass, adenopathy, or hernia. Chronic grade II anterolisthesis L5-S1 with question fusion.  IMPRESSION: Distal small bowel obstruction. Mild bowel wall edema/thickening identified at distal small bowel, nonspecific; this can be seen with inflammatory bowel disease, infection, high-grade obstruction, ischemia, and prior radiation therapy. Small bowel dilatation and mild distal small bowel wall thickening are new since previous exam. Remaining intra abdominal findings are stable.  Original Report Authenticated By: Lollie Marrow, M.D.   Ct Abdomen Pelvis W Contrast 02/19/2011  *RADIOLOGY REPORT*  Clinical Data:  Abdominal pain, distention, nausea, vomiting, chills, history of  partial right nephrectomy for angiomyolipoma, hysterectomy, cervical/endometrial cancer, follow-up lung nodule  CT CHEST, ABDOMEN AND PELVIS WITH CONTRAST  Technique:  Multidetector CT imaging of the chest, abdomen and pelvis was performed following the standard protocol during bolus administration of intravenous  contrast.  Sagittal and coronal MPR images reconstructed from axial data set.  Contrast: OMNIPAQUE IOHEXOL 300 MG/ML IV SOLN.  Dilute oral contrast.  Comparison:  CT chest 09/10/2009, CT abdomen and pelvis 06/11/2007  CT CHEST  Findings: Thoracic vascular structures grossly patent on non dedicated exam. Minimal scattered coronary arterial calcification. No thoracic adenopathy. Surgical clips left axilla question. Tiny right upper lobe pulmonary nodule image 19 stable. Minimal nodularity right major fissure image 29 stable. Interval decrease in size of right lower lobe nodule 4 x 2 mm image 38, previously 6 x 3 mm. Mild dependent atelectasis at lung bases. Lungs otherwise clear. No acute osseous findings. Skin mild scattered degenerative disc disease changes thoracic spine.  IMPRESSION: Tiny right lung pulmonary nodule stable since previous exam. Minimal dependent atelectasis at lung bases.  CT ABDOMEN AND PELVIS  Findings: Postsurgical deformity upper pole right kidney. Low attenuation focus laterally in spleen 10 x 8 mm image 61 unchanged. Small left renal cyst unchanged. Liver, spleen, pancreas, kidneys, and adrenal glands otherwise normal appearance. Retroperitoneal surgical clips extending into pelvis bilaterally. Small amount nonspecific free pelvic fluid. Uterus surgically absent with nonvisualization of the ovaries and appendix.  Dilatation of the mid small bowel loops with normal caliber terminal ileum. Findings are compatible with distal small bowel obstruction.  Mild bowel wall thickening of a few distal small bowel loops, nonspecific. Colon and stomach decompressed and unremarkable. No  mass, adenopathy, or hernia. Chronic grade II anterolisthesis L5-S1 with question fusion.  IMPRESSION: Distal small bowel obstruction. Mild bowel wall edema/thickening identified at distal small bowel, nonspecific; this can be seen with inflammatory bowel disease, infection, high-grade obstruction, ischemia, and prior radiation therapy. Small bowel dilatation and mild distal small bowel wall thickening are new since previous exam. Remaining intra abdominal findings are stable.  Original Report Authenticated By: Lollie Marrow, M.D.    BP 160/97  Pulse 74  Temp(Src) 98 F (36.7 C) (Oral)  Resp 20  Wt 150 lb (68.04 kg)  SpO2 100%   10:05 AM:  CT scan as above.  Lactic acid mildly elevated, repeat pending. VS remain stable, afebrile.  VSS, A&O, CTA, RRR, abd soft/mild diffuse TTP, neuro non-focal.  Dx testing d/w pt.  Questions answered.  Verb understanding, agreeable to admit.    10:53 AM:  T/C to [facility] Dr. Arthor Captain, case discussed, including:  HPI, pertinent PM/SHx, VS/PE, dx testing, ED course and treatment.  Agreeable to admit.  Requests to obtain medical bed to team 2.    Laurina Fischl Allison Quarry, DO 02/19/11 1054

## 2011-02-19 NOTE — ED Notes (Signed)
Pt is aware of the need for urine sample. Pt states there is "no chance" at this time of her giving a sample

## 2011-02-19 NOTE — ED Notes (Signed)
Dr Miller bedside to speak with pt 

## 2011-02-19 NOTE — Consult Note (Signed)
Reason for Consult:?Small bowel obstruction. Referring Physician: [facility]-Dr. Clydia Llano. [name] Richards is an 66 y.o. female who has a history of recurrent abdominal pain.   HPI: Patient had worsening of her abdominal pain and nausea with vomiting, at about 4:00 AM this morning, after she had 2 BM's. She then presented to the ER at [facility] and a CT scan of the chest, abdomen and pelvis revealed a ?SBO and a stable pulmonary nodule. She claims she is feeling significantly better now and wants to know when she can go home. She denies a history of melena or hematochezia. Her appetite is good and her weight has been stable. There is no history of ulcers, jaundice or colitis.  Past Medical History  Diagnosis Date  . Asthma   . Hypertension   . Thyroid disease   . Cervical cancer   . Endometrial cancer   . Hyperlipemia   . Angiomyolipoma of kidney   . Vertebral body hemangioma     T12  . Small bowel obstruction   . Complication of anesthesia     took 2 days to wake up after 1 surgery    Past Surgical History  Procedure Date  . Back surgery   . Tubal ligation   . Skin biopsy   . Abdominal hysterectomy   . Partial nephrectomy     right    History reviewed. No pertinent family history.  Social History:  reports that she has never smoked. She has never used smokeless tobacco. She reports that she does not drink alcohol or use illicit drugs.  Allergies:  Allergies  Allergen Reactions  . Codeine   . Penicillins   . Prochlorperazine Edisylate     Medications: I have reviewed the patient's current medications.  Results for orders placed during the hospital encounter of 02/19/11 (from the past 48 hour(s))  COMPREHENSIVE METABOLIC PANEL     Status: Abnormal   Collection Time   02/19/11  6:50 AM      Component Value Range Comment   Sodium 134 (*) 135 - 145 (mEq/L)    Potassium 3.7  3.5 - 5.1 (mEq/L)    Chloride 94 (*) 96 - 112 (mEq/L)    CO2 26  19 - 32 (mEq/L)    Glucose, Bld 131 (*) 70 - 99 (mg/dL)    BUN 18  6 - 23 (mg/dL)    Creatinine, Ser 4.54  0.50 - 1.10 (mg/dL)    Calcium 09.8  8.4 - 10.5 (mg/dL)    Total Protein 7.4  6.0 - 8.3 (g/dL)    Albumin 4.2  3.5 - 5.2 (g/dL)    AST 22  0 - 37 (U/L)    ALT 24  0 - 35 (U/L)    Alkaline Phosphatase 90  39 - 117 (U/L)    Total Bilirubin 0.4  0.3 - 1.2 (mg/dL)    GFR calc non Af Amer 89 (*) >90 (mL/min)    GFR calc Af Amer >90  >90 (mL/min)   CBC     Status: Abnormal   Collection Time   02/19/11  6:50 AM      Component Value Range Comment   WBC 9.8  4.0 - 10.5 (K/uL)    RBC 6.08 (*) 3.87 - 5.11 (MIL/uL)    Hemoglobin 13.2  12.0 - 15.0 (g/dL)    HCT 11.9  14.7 - 82.9 (%)    MCV 66.1 (*) 78.0 - 100.0 (fL)    MCH 21.7 (*) 26.0 - 34.0 (  pg)    MCHC 32.8  30.0 - 36.0 (g/dL)    RDW 16.1  09.6 - 04.5 (%)    Platelets 224  150 - 400 (K/uL)   DIFFERENTIAL     Status: Abnormal   Collection Time   02/19/11  6:50 AM      Component Value Range Comment   Neutrophils Relative 86 (*) 43 - 77 (%)    Lymphocytes Relative 9 (*) 12 - 46 (%)    Monocytes Relative 5  3 - 12 (%)    Eosinophils Relative 0  0 - 5 (%)    Basophils Relative 0  0 - 1 (%)    Neutro Abs 8.4 (*) 1.7 - 7.7 (K/uL)    Lymphs Abs 0.9  0.7 - 4.0 (K/uL)    Monocytes Absolute 0.5  0.1 - 1.0 (K/uL)    Eosinophils Absolute 0.0  0.0 - 0.7 (K/uL)    Basophils Absolute 0.0  0.0 - 0.1 (K/uL)    RBC Morphology MICROCYTES      WBC Morphology WHITE COUNT CONFIRMED ON SMEAR      Smear Review PLATELET COUNT CONFIRMED BY SMEAR     LACTIC ACID, PLASMA     Status: Abnormal   Collection Time   02/19/11  6:50 AM      Component Value Range Comment   Lactic Acid, Venous 3.0 (*) 0.5 - 2.2 (mmol/L)   LIPASE, BLOOD     Status: Normal   Collection Time   02/19/11  6:50 AM      Component Value Range Comment   Lipase 40  11 - 59 (U/L)   POCT I-STAT, CHEM 8     Status: Abnormal   Collection Time   02/19/11  6:55 AM      Component Value Range Comment   Sodium 136   135 - 145 (mEq/L)    Potassium 3.9  3.5 - 5.1 (mEq/L)    Chloride 98  96 - 112 (mEq/L)    BUN 20  6 - 23 (mg/dL)    Creatinine, Ser 4.09  0.50 - 1.10 (mg/dL)    Glucose, Bld 811 (*) 70 - 99 (mg/dL)    Calcium, Ion 9.14 (*) 1.12 - 1.32 (mmol/L)    TCO2 28  0 - 100 (mmol/L)    Hemoglobin 14.6  12.0 - 15.0 (g/dL)    HCT 78.2  95.6 - 21.3 (%)   LACTIC ACID, PLASMA     Status: Normal   Collection Time   02/19/11  9:15 AM      Component Value Range Comment   Lactic Acid, Venous 1.7  0.5 - 2.2 (mmol/L)   URINALYSIS, ROUTINE W REFLEX MICROSCOPIC     Status: Abnormal   Collection Time   02/19/11  1:40 PM      Component Value Range Comment   Color, Urine YELLOW  YELLOW     APPearance CLEAR  CLEAR     Specific Gravity, Urine 1.043 (*) 1.005 - 1.030     pH 7.5  5.0 - 8.0     Glucose, UA NEGATIVE  NEGATIVE (mg/dL)    Hgb urine dipstick NEGATIVE  NEGATIVE     Bilirubin Urine NEGATIVE  NEGATIVE     Ketones, ur NEGATIVE  NEGATIVE (mg/dL)    Protein, ur NEGATIVE  NEGATIVE (mg/dL)    Urobilinogen, UA 0.2  0.0 - 1.0 (mg/dL)    Nitrite NEGATIVE  NEGATIVE     Leukocytes, UA TRACE (*) NEGATIVE  URINE MICROSCOPIC-ADD ON     Status: Abnormal   Collection Time   02/19/11  1:40 PM      Component Value Range Comment   Squamous Epithelial / LPF FEW (*) RARE     RBC / HPF 0-2  <3 (RBC/hpf)    Bacteria, UA FEW (*) RARE     Urine-Other MUCOUS PRESENT       Ct Chest W Contrast  02/19/2011  *RADIOLOGY REPORT*  Clinical Data:  Abdominal pain, distention, nausea, vomiting, chills, history of partial right nephrectomy for angiomyolipoma, hysterectomy, cervical/endometrial cancer, follow-up lung nodule  CT CHEST, ABDOMEN AND PELVIS WITH CONTRAST  Technique:  Multidetector CT imaging of the chest, abdomen and pelvis was performed following the standard protocol during bolus administration of intravenous contrast.  Sagittal and coronal MPR images reconstructed from axial data set.  Contrast: OMNIPAQUE IOHEXOL  300 MG/ML IV SOLN.  Dilute oral contrast.  Comparison:  CT chest 09/10/2009, CT abdomen and pelvis 06/11/2007  CT CHEST  Findings: Thoracic vascular structures grossly patent on non dedicated exam. Minimal scattered coronary arterial calcification. No thoracic adenopathy. Surgical clips left axilla question. Tiny right upper lobe pulmonary nodule image 19 stable. Minimal nodularity right major fissure image 29 stable. Interval decrease in size of right lower lobe nodule 4 x 2 mm image 38, previously 6 x 3 mm. Mild dependent atelectasis at lung bases. Lungs otherwise clear. No acute osseous findings. Skin mild scattered degenerative disc disease changes thoracic spine.  IMPRESSION: Tiny right lung pulmonary nodule stable since previous exam. Minimal dependent atelectasis at lung bases.  CT ABDOMEN AND PELVIS  Findings: Postsurgical deformity upper pole right kidney. Low attenuation focus laterally in spleen 10 x 8 mm image 61 unchanged. Small left renal cyst unchanged. Liver, spleen, pancreas, kidneys, and adrenal glands otherwise normal appearance. Retroperitoneal surgical clips extending into pelvis bilaterally. Small amount nonspecific free pelvic fluid. Uterus surgically absent with nonvisualization of the ovaries and appendix.  Dilatation of the mid small bowel loops with normal caliber terminal ileum. Findings are compatible with distal small bowel obstruction.  Mild bowel wall thickening of a few distal small bowel loops, nonspecific. Colon and stomach decompressed and unremarkable. No mass, adenopathy, or hernia. Chronic grade II anterolisthesis L5-S1 with question fusion.  IMPRESSION: Distal small bowel obstruction. Mild bowel wall edema/thickening identified at distal small bowel, nonspecific; this can be seen with inflammatory bowel disease, infection, high-grade obstruction, ischemia, and prior radiation therapy. Small bowel dilatation and mild distal small bowel wall thickening are new since previous  exam. Remaining intra abdominal findings are stable.  Original Report Authenticated By: Lollie Marrow, M.D.   Ct Abdomen Pelvis W Contrast  02/19/2011  *RADIOLOGY REPORT*  Clinical Data:  Abdominal pain, distention, nausea, vomiting, chills, history of partial right nephrectomy for angiomyolipoma, hysterectomy, cervical/endometrial cancer, follow-up lung nodule  CT CHEST, ABDOMEN AND PELVIS WITH CONTRAST  Technique:  Multidetector CT imaging of the chest, abdomen and pelvis was performed following the standard protocol during bolus administration of intravenous contrast.  Sagittal and coronal MPR images reconstructed from axial data set.  Contrast: OMNIPAQUE IOHEXOL 300 MG/ML IV SOLN.  Dilute oral contrast.  Comparison:  CT chest 09/10/2009, CT abdomen and pelvis 06/11/2007  CT CHEST  Findings: Thoracic vascular structures grossly patent on non dedicated exam. Minimal scattered coronary arterial calcification. No thoracic adenopathy. Surgical clips left axilla question. Tiny right upper lobe pulmonary nodule image 19 stable. Minimal nodularity right major fissure image 29 stable. Interval decrease  in size of right lower lobe nodule 4 x 2 mm image 38, previously 6 x 3 mm. Mild dependent atelectasis at lung bases. Lungs otherwise clear. No acute osseous findings. Skin mild scattered degenerative disc disease changes thoracic spine.  IMPRESSION: Tiny right lung pulmonary nodule stable since previous exam. Minimal dependent atelectasis at lung bases.  CT ABDOMEN AND PELVIS  Findings: Postsurgical deformity upper pole right kidney. Low attenuation focus laterally in spleen 10 x 8 mm image 61 unchanged. Small left renal cyst unchanged. Liver, spleen, pancreas, kidneys, and adrenal glands otherwise normal appearance. Retroperitoneal surgical clips extending into pelvis bilaterally. Small amount nonspecific free pelvic fluid. Uterus surgically absent with nonvisualization of the ovaries and appendix.  Dilatation of  the mid small bowel loops with normal caliber terminal ileum. Findings are compatible with distal small bowel obstruction.  Mild bowel wall thickening of a few distal small bowel loops, nonspecific. Colon and stomach decompressed and unremarkable. No mass, adenopathy, or hernia. Chronic grade II anterolisthesis L5-S1 with question fusion.  IMPRESSION: Distal small bowel obstruction. Mild bowel wall edema/thickening identified at distal small bowel, nonspecific; this can be seen with inflammatory bowel disease, infection, high-grade obstruction, ischemia, and prior radiation therapy. Small bowel dilatation and mild distal small bowel wall thickening are new since previous exam. Remaining intra abdominal findings are stable.  Original Report Authenticated By: Lollie Marrow, M.D.    Review of Systems  Constitutional: Negative for fever, chills, weight loss, malaise/fatigue and diaphoresis.  HENT: Negative for hearing loss, ear pain, nosebleeds, congestion, sore throat, neck pain, tinnitus and ear discharge.   Eyes: Negative for double vision, photophobia, pain, discharge and redness.  Respiratory: Negative for cough, hemoptysis, sputum production, shortness of breath, wheezing and stridor.   Cardiovascular: Negative for palpitations, orthopnea, claudication, leg swelling and PND.  Gastrointestinal: Positive for nausea, vomiting and abdominal pain. Negative for heartburn, diarrhea, constipation, blood in stool and melena.  Genitourinary: Negative for dysuria, urgency, frequency and hematuria.  Musculoskeletal: Negative for myalgias, back pain, joint pain and falls.  Skin: Negative for itching and rash.  Neurological: Negative for dizziness, tingling, tremors, sensory change, speech change, focal weakness, seizures, loss of consciousness, weakness and headaches.  Endo/Heme/Allergies: Negative for environmental allergies and polydipsia. Does not bruise/bleed easily.  Psychiatric/Behavioral: Negative for  depression, suicidal ideas, hallucinations, memory loss and substance abuse. The patient is not nervous/anxious and does not have insomnia.    Blood pressure 140/77, pulse 66, temperature 98.6 F (37 C), temperature source Oral, resp. rate 20, weight 68.04 kg (150 lb), SpO2 95.00%. Physical Exam  Constitutional: She is oriented to person, place, and time. She appears well-developed and well-nourished.  HENT:  Head: Normocephalic and atraumatic.  Mouth/Throat: No oropharyngeal exudate.  Eyes: EOM are normal. Pupils are equal, round, and reactive to light.  Neck: Normal range of motion. No JVD present. No tracheal deviation present. No thyromegaly present.  Cardiovascular: Normal rate, regular rhythm and normal heart sounds.  Exam reveals no gallop and no friction rub.   No murmur heard. Respiratory: No stridor. No respiratory distress. She has no wheezes. She has no rales. She exhibits no tenderness.  GI: Soft. She exhibits no distension and no mass. There is no tenderness. There is no rebound and no guarding.  Musculoskeletal: She exhibits no edema and no tenderness.  Lymphadenopathy:    She has no cervical adenopathy.  Neurological: She is alert and oriented to person, place, and time. She has normal reflexes. No cranial nerve deficit. Coordination normal.  Skin: Skin is warm and dry. No rash noted. No erythema. No pallor.  Psychiatric: She has a normal mood and affect. Her behavior is normal. Judgment and thought content normal.   Assessment/Plan: 1) Recurrent abdominal pain ?SBO on CT scan done today: agree with bowel rest and empiric antibiotics [Cipro and Flagyl]. The patient claims she currently feels much better. As discussed with Dr. Arthor Captain, repeat abdominal films should be done tomorrow, to determine further course of action. I suspect adhesions may be the cause of her SBO. I have asked Dr. Luretha Murphy to see the patient in consultation. 2) Hyperlipidemia. Marland Kitchen  3) HTN.  4) S/P  multiple abdominal surgeries in the past. 5) S/P Right nephrectomy for a large angiolipoma.  6) History of ?cervical?endometrial cancer.  Janaysha Depaulo 02/19/2011, 6:16 PM

## 2011-02-19 NOTE — ED Notes (Signed)
Patient talking with surgeon about surgery

## 2011-02-19 NOTE — ED Notes (Signed)
Patient complained of back and leg pain. Patient wanted to walk. Patient ambulated down the hall to stretch legs and back accompanied by family member with stand by staff assistance.

## 2011-02-19 NOTE — ED Notes (Signed)
Triage completed by this RN, not Rayfield Citizen NT

## 2011-02-19 NOTE — Progress Notes (Signed)
Patient ID: [name] Richards, female   DOB: 05-19-1945, 66 y.o.   MRN: [id number] [facility] Surgery Progress Note:   * No surgery found *  Subjective: This 66 year old lady was seen by me in room 30 in the emergency room. I was asked to see the patient by Dr. Loreta Ave who is followed her with intermittent episodes of short duration partial small bowel obstruction. The patient is very health conscious and follows a very high fiber diet. Interestingly prior to this episode she ate a bag of popcorn which in my experience can produce partial small bowel obstruction from a food bezoar.  She has a significant history of endometrial or cervical cancer that required not only a radical hysterectomy but she admits to radiation to her pelvis. This could complicate any surgery that his performed for bowel obstruction. It may be that if an elective procedure were contemplated that she could have a bowel prep before undergoing such an exploration.  Currently she is nontender in this episode has resolved. I reviewed her CT scan which shows some changes in her pelvis on the right side which may be adhesive in nature. Objective: Vital signs in last 24 hours: Temp:  [98 F (36.7 C)-98.6 F (37 C)] 98.6 F (37 C) (01/09 1126) Pulse Rate:  [66-74] 66  (01/09 1519) Resp:  [16-20] 20  (01/09 1519) BP: (134-160)/(69-97) 140/77 mmHg (01/09 1519) SpO2:  [95 %-100 %] 95 % (01/09 1519) Weight:  [150 lb (68.04 kg)] 150 lb (68.04 kg) (01/09 0504)  Intake/Output from previous day:   Intake/Output this shift:    Physical Exam:  Abdomen is nontender to palpation at the present time. No rebound or guarding. Lab Results:   Basename 02/19/11 0655 02/19/11 0650  WBC -- 9.8  HGB 14.6 13.2  HCT 43.0 40.2  PLT -- 224   BMET  Basename 02/19/11 0655 02/19/11 0650  NA 136 134*  K 3.9 3.7  CL 98 94*  CO2 -- 26  GLUCOSE 133* 131*  BUN 20 18  CREATININE 0.90 0.69  CALCIUM -- 10.1   PT/INR No results found for this  basename: LABPROT:2,INR:2 in the last 72 hours Studies/Results: Ct Chest W Contrast  02/19/2011  *RADIOLOGY REPORT*  Clinical Data:  Abdominal pain, distention, nausea, vomiting, chills, history of partial right nephrectomy for angiomyolipoma, hysterectomy, cervical/endometrial cancer, follow-up lung nodule  CT CHEST, ABDOMEN AND PELVIS WITH CONTRAST  Technique:  Multidetector CT imaging of the chest, abdomen and pelvis was performed following the standard protocol during bolus administration of intravenous contrast.  Sagittal and coronal MPR images reconstructed from axial data set.  Contrast: OMNIPAQUE IOHEXOL 300 MG/ML IV SOLN.  Dilute oral contrast.  Comparison:  CT chest 09/10/2009, CT abdomen and pelvis 06/11/2007  CT CHEST  Findings: Thoracic vascular structures grossly patent on non dedicated exam. Minimal scattered coronary arterial calcification. No thoracic adenopathy. Surgical clips left axilla question. Tiny right upper lobe pulmonary nodule image 19 stable. Minimal nodularity right major fissure image 29 stable. Interval decrease in size of right lower lobe nodule 4 x 2 mm image 38, previously 6 x 3 mm. Mild dependent atelectasis at lung bases. Lungs otherwise clear. No acute osseous findings. Skin mild scattered degenerative disc disease changes thoracic spine.  IMPRESSION: Tiny right lung pulmonary nodule stable since previous exam. Minimal dependent atelectasis at lung bases.  CT ABDOMEN AND PELVIS  Findings: Postsurgical deformity upper pole right kidney. Low attenuation focus laterally in spleen 10 x 8 mm image 61  unchanged. Small left renal cyst unchanged. Liver, spleen, pancreas, kidneys, and adrenal glands otherwise normal appearance. Retroperitoneal surgical clips extending into pelvis bilaterally. Small amount nonspecific free pelvic fluid. Uterus surgically absent with nonvisualization of the ovaries and appendix.  Dilatation of the mid small bowel loops with normal caliber terminal  ileum. Findings are compatible with distal small bowel obstruction.  Mild bowel wall thickening of a few distal small bowel loops, nonspecific. Colon and stomach decompressed and unremarkable. No mass, adenopathy, or hernia. Chronic grade II anterolisthesis L5-S1 with question fusion.  IMPRESSION: Distal small bowel obstruction. Mild bowel wall edema/thickening identified at distal small bowel, nonspecific; this can be seen with inflammatory bowel disease, infection, high-grade obstruction, ischemia, and prior radiation therapy. Small bowel dilatation and mild distal small bowel wall thickening are new since previous exam. Remaining intra abdominal findings are stable.  Original Report Authenticated By: Lollie Marrow, M.D.   Ct Abdomen Pelvis W Contrast  02/19/2011  *RADIOLOGY REPORT*  Clinical Data:  Abdominal pain, distention, nausea, vomiting, chills, history of partial right nephrectomy for angiomyolipoma, hysterectomy, cervical/endometrial cancer, follow-up lung nodule  CT CHEST, ABDOMEN AND PELVIS WITH CONTRAST  Technique:  Multidetector CT imaging of the chest, abdomen and pelvis was performed following the standard protocol during bolus administration of intravenous contrast.  Sagittal and coronal MPR images reconstructed from axial data set.  Contrast: OMNIPAQUE IOHEXOL 300 MG/ML IV SOLN.  Dilute oral contrast.  Comparison:  CT chest 09/10/2009, CT abdomen and pelvis 06/11/2007  CT CHEST  Findings: Thoracic vascular structures grossly patent on non dedicated exam. Minimal scattered coronary arterial calcification. No thoracic adenopathy. Surgical clips left axilla question. Tiny right upper lobe pulmonary nodule image 19 stable. Minimal nodularity right major fissure image 29 stable. Interval decrease in size of right lower lobe nodule 4 x 2 mm image 38, previously 6 x 3 mm. Mild dependent atelectasis at lung bases. Lungs otherwise clear. No acute osseous findings. Skin mild scattered degenerative  disc disease changes thoracic spine.  IMPRESSION: Tiny right lung pulmonary nodule stable since previous exam. Minimal dependent atelectasis at lung bases.  CT ABDOMEN AND PELVIS  Findings: Postsurgical deformity upper pole right kidney. Low attenuation focus laterally in spleen 10 x 8 mm image 61 unchanged. Small left renal cyst unchanged. Liver, spleen, pancreas, kidneys, and adrenal glands otherwise normal appearance. Retroperitoneal surgical clips extending into pelvis bilaterally. Small amount nonspecific free pelvic fluid. Uterus surgically absent with nonvisualization of the ovaries and appendix.  Dilatation of the mid small bowel loops with normal caliber terminal ileum. Findings are compatible with distal small bowel obstruction.  Mild bowel wall thickening of a few distal small bowel loops, nonspecific. Colon and stomach decompressed and unremarkable. No mass, adenopathy, or hernia. Chronic grade II anterolisthesis L5-S1 with question fusion.  IMPRESSION: Distal small bowel obstruction. Mild bowel wall edema/thickening identified at distal small bowel, nonspecific; this can be seen with inflammatory bowel disease, infection, high-grade obstruction, ischemia, and prior radiation therapy. Small bowel dilatation and mild distal small bowel wall thickening are new since previous exam. Remaining intra abdominal findings are stable.  Original Report Authenticated By: Lollie Marrow, M.D.   Anti-infectives: Anti-infectives     Start     Dose/Rate Route Frequency Ordered Stop   02/19/11 1745   ciprofloxacin (CIPRO) IVPB 400 mg        400 mg 200 mL/hr over 60 Minutes Intravenous Every 12 hours 02/19/11 1730     02/19/11 1745   metroNIDAZOLE (FLAGYL) IVPB  500 mg        500 mg 100 mL/hr over 60 Minutes Intravenous Every 8 hours 02/19/11 1730            Assessment/Plan: Problem List: Patient Active Problem List  Diagnoses  . CERVICAL CANCER  . CANCER, ENDOMETRIUM  . HYPERLIPIDEMIA  .  HYPERTENSION  . ALLERGIC RHINITIS  . PLEURAL EFFUSION, RIGHT  . PULMONARY NODULE  . DYSPNEA  . SBO (small bowel obstruction)  . Abdominal pain    History of what sounds like an intermittent bowel obstruction picture that I am concerned might be related to a very high fiber diet. We discussed possibly trying to diminish the amount of fiber in her diet and continue her exercise regimen along with her fluid intake which is generous. As her history of radiation exploratory laparotomy will have higher risk of enterotomy and postoperative complications. The [facility] service will follow up on her in the morning. * No surgery found *    LOS: 0 days   Matt B. Daphine Deutscher, MD, [facility] [phone] beeper [phone]  02/19/2011 9:41 PM

## 2011-02-19 NOTE — H&P (Signed)
Hospital Admission Note Date: 02/19/2011  Patient name: ADALIS GATTI           Medical record number: [id number] Date of birth: [birth year]           Age: 66 y.o.   Gender: female    PCP:   Cala Bradford, MD, MD   Chief Complaint:  Abdominal pain  HPI: CASSANDRIA DREW is a 66 y.o. female with past medical history of cervical cancer and endometrial cancer. Patient had previous for abdominal surgeries. Patient came into the hospital complaining about abdominal pain and distention. These symptoms followed by nausea and 5 loose bowel movement. Patient patient reported similar episodes in the past. Being followed by Dr. Loreta Ave, and she suggested according to the patient bacterial overgrowth. Patient was taking probiotics. Upon initial evaluation in the emergency department CT scan of abdomen and pelvis was done and showed small bowel obstruction with distal ileum thickening. Patient will be admitted to the hospital for further evaluation.  Past Medical History: Past Medical History  Diagnosis Date  . Asthma   . Hypertension   . Thyroid disease   . Cervical cancer   . Endometrial cancer   . Hyperlipemia   . Angiomyolipoma of kidney   . Vertebral body hemangioma     T12  . Small bowel obstruction   . Complication of anesthesia     took 2 days to wake up after 1 surgery   Past Surgical History  Procedure Date  . Back surgery   . Tubal ligation   . Skin biopsy   . Abdominal hysterectomy   . Partial nephrectomy     right    Medications: Prior to Admission medications   Medication Sig Start Date End Date Taking? Authorizing Provider  benazepril-hydrochlorthiazide (LOTENSIN HCT) 20-12.5 MG per tablet Take 1 tablet by mouth daily.   Yes Historical Provider, MD  levothyroxine (SYNTHROID, LEVOTHROID) 75 MCG tablet Take 75 mcg by mouth daily.   Yes Historical Provider, MD  simvastatin (ZOCOR) 20 MG tablet Take 20 mg by mouth every evening.   Yes Historical Provider, MD    Allergies:     Allergies  Allergen Reactions  . Codeine   . Penicillins   . Prochlorperazine Edisylate     Social History:  reports that she has never smoked. She has never used smokeless tobacco. She reports that she does not drink alcohol or use illicit drugs.  Family History: History reviewed. No pertinent family history.  Review of Systems:  Constitutional: negative for anorexia, fevers and sweats Eyes: negative for irritation, redness and visual disturbance Ears, nose, mouth, throat, and face: negative for earaches, epistaxis, nasal congestion and sore throat Respiratory: negative for cough, dyspnea on exertion, sputum and wheezing Cardiovascular: negative for chest pain, dyspnea, lower extremity edema, orthopnea, palpitations and syncope Gastrointestinal: negative for abdominal pain, constipation, diarrhea, melena, nausea and vomiting Genitourinary:negative for dysuria, frequency and hematuria Hematologic/lymphatic: negative for bleeding, easy bruising and lymphadenopathy Musculoskeletal:negative for arthralgias, muscle weakness and stiff joints Neurological: negative for coordination problems, gait problems, headaches and weakness Endocrine: negative for diabetic symptoms including polydipsia, polyuria and weight loss Allergic/Immunologic: negative for anaphylaxis, hay fever and urticaria  Physical Exam: BP 140/77  Pulse 66  Temp(Src) 98.6 F (37 C) (Oral)  Resp 20  Wt 68.04 kg (150 lb)  SpO2 95% General appearance: alert, cooperative and no distress  Head: Normocephalic, without obvious abnormality, atraumatic  Eyes: conjunctivae/corneas clear. PERRL, EOM's intact. Fundi benign.  Nose: Nares normal. Septum  midline. Mucosa normal. No drainage or sinus tenderness.  Throat: lips, mucosa, and tongue normal; teeth and gums normal  Neck: Supple, no masses, no cervical lymphadenopathy, no JVD appreciated, no meningeal signs Resp: clear to auscultation bilaterally  Chest wall: no  tenderness  Cardio: regular rate and rhythm, S1, S2 normal, no murmur, click, rub or gallop  GI: soft, non-tender; bowel sounds normal; no masses, no organomegaly  Extremities: extremities normal, atraumatic, no cyanosis or edema  Skin: Skin color, texture, turgor normal. No rashes or lesions  Neurologic: Alert and oriented X 3, normal strength and tone. Normal symmetric reflexes. Normal coordination and gait  Labs on Admission:   [facility] 02/19/11 0655 02/19/11 0650  NA 136 134*  K 3.9 3.7  CL 98 94*  CO2 -- 26  GLUCOSE 133* 131*  BUN 20 18  CREATININE 0.90 0.69  CALCIUM -- 10.1  MG -- --  PHOS -- --    Basename 02/19/11 0650  AST 22  ALT 24  ALKPHOS 90  BILITOT 0.4  PROT 7.4  ALBUMIN 4.2    Basename 02/19/11 0650  LIPASE 40  AMYLASE --    Basename 02/19/11 0655 02/19/11 0650  WBC -- 9.8  NEUTROABS -- 8.4*  HGB 14.6 13.2  HCT 43.0 40.2  MCV -- 66.1*  PLT -- 224   Radiological Exams on Admission: Ct Chest W Contrast/Ct Abdomen Pelvis W Contrast 02/19/2011    IMPRESSION: Distal small bowel obstruction. Mild bowel wall edema/thickening identified at distal small bowel, nonspecific; this can be seen with inflammatory bowel disease, infection, high-grade obstruction, ischemia, and prior radiation therapy. Small bowel dilatation and mild distal small bowel wall thickening are new since previous exam. Remaining intra abdominal findings are stable.  Original Report Authenticated By: Lollie Marrow, M.D.     IMPRESSION: Present on Admission:  .SBO (small bowel obstruction) .HYPERTENSION .Abdominal pain  Assessment/Plan 1. Small bowel obstruction: Patient will be admitted to the hospital she says this has been episodic for the past year and she is being followed by her gastroenterologist for that. She felt she is better by now. I will put patient on clear liquids and I will obtain a KUB in the morning.of note patient also With serial clinical examination.  2. Distal  ileitis: CT scan evidence of distal distal ileitis. He this might represent inflammatory bowel disease,or high-grade obstruction. I will check stool studies.I will check stool studies I may start patient on antibiotics.  3. Abdominal pain: This is likely secondary to a #1 and 2, I will treat his pain medication for symptoms relief.   Duc Crocket A 02/19/2011, 5:19 PM

## 2011-02-19 NOTE — ED Notes (Signed)
Checking with md if they want patient npo with no meds or npo with meds only.

## 2011-02-19 NOTE — ED Notes (Signed)
Pt alert, nad, c/o abd pain, states has had several "episode", presents to ED with prescription for CT scan dated 01/09/11, states "you need to call Dr Loreta Ave", resp even unlabored, skin pwd, +emesis pta

## 2011-02-19 NOTE — ED Provider Notes (Signed)
History     CSN: [id number]  Arrival date & time 02/19/11  0451   First MD Initiated Contact with Patient 02/19/11 [phone]      Chief Complaint  Patient presents with  . Abdominal Pain    (Consider location/radiation/quality/duration/timing/severity/associated sxs/prior treatment) HPI Comments: 66 year old female with a history of irritable  bowel syndrome , radical hysterectomy, tubal ligation who presents with severe abdominal pain for approximately the last 2 and half hours. Pain was acute in onset, intermittent, comes in waves, currently has resolved, associated with nausea and vomiting prior to arrival. She denies cough, shortness of breath, back pain, swelling, headache. She states that her bowel movements over the last several hours have been frequent loose stools, nonbloody, non-watery. She has been seen by gastroenterology in the past and was told to come to the emergency department should her pain become severe. She presents with a prescription for a CT scan written in November of last year. She has not had to use it because she has not had this type of pain since that time. She denies alcohol intake and has no history of pancreatitis, cholecystitis or appendicitis. She describes the pain as all over, associated with distention  Patient is a 66 y.o. female presenting with abdominal pain. The history is provided by the patient and medical records.  Abdominal Pain The primary symptoms of the illness include abdominal pain.  Abdominal Pain    Past Medical History  Diagnosis Date  . Asthma   . Cancer   . Hypertension   . Thyroid disease     Past Surgical History  Procedure Date  . Back surgery   . Tubal ligation   . Skin biopsy   . Abdominal hysterectomy     No family history on file.  History  Substance Use Topics  . Smoking status: Not on file  . Smokeless tobacco: Not on file  . Alcohol Use:     OB History    Grav Para Term Preterm Abortions TAB SAB Ect Mult  Living                  Review of Systems  Gastrointestinal: Positive for abdominal pain.  All other systems reviewed and are negative.    Allergies  Codeine; Penicillins; and Prochlorperazine edisylate  Home Medications  No current outpatient prescriptions on file.  BP 160/97  Pulse 74  Temp(Src) 98 F (36.7 C) (Oral)  Resp 20  Wt 150 lb (68.04 kg)  SpO2 100%  Physical Exam  Nursing note and vitals reviewed. Constitutional: She appears well-developed and well-nourished. No distress.  HENT:  Head: Normocephalic and atraumatic.  Mouth/Throat: Oropharynx is clear and moist. No oropharyngeal exudate.  Eyes: Conjunctivae and EOM are normal. Pupils are equal, round, and reactive to light. Right eye exhibits no discharge. Left eye exhibits no discharge. No scleral icterus.  Neck: Normal range of motion. Neck supple. No JVD present. No thyromegaly present.  Cardiovascular: Normal rate, regular rhythm, normal heart sounds and intact distal pulses.  Exam reveals no gallop and no friction rub.   No murmur heard. Pulmonary/Chest: Effort normal and breath sounds normal. No respiratory distress. She has no wheezes. She has no rales.  Abdominal: Soft. Bowel sounds are normal. She exhibits no distension and no mass. There is tenderness ( Mild to moderate suprapubic tenderness, mild diffuse tenderness otherwise. No guarding, non-peritoneal, no pulsating masses).  Musculoskeletal: Normal range of motion. She exhibits no edema and no tenderness.  Lymphadenopathy:  She has no cervical adenopathy.  Neurological: She is alert. Coordination normal.  Skin: Skin is warm and dry. No rash noted. No erythema.  Psychiatric: She has a normal mood and affect. Her behavior is normal.    ED Course  Procedures (including critical care time)   Labs Reviewed  I-STAT, CHEM 8  COMPREHENSIVE METABOLIC PANEL  CBC  DIFFERENTIAL  LACTIC ACID, PLASMA  LIPASE, BLOOD  URINALYSIS, ROUTINE W REFLEX  MICROSCOPIC   No results found.   No diagnosis found.    MDM  Vital signs appear slightly hypertensive otherwise normal. Pain medication nausea medication offered, labs, CT scan. I discussed her care with Dr. Loreta Ave who has recommended CT scan of the chest abdomen and pelvis.  Will rule out pancreatitis, small bowel obstruction. Labs

## 2011-02-20 ENCOUNTER — Encounter (HOSPITAL_COMMUNITY): Payer: Self-pay | Admitting: Surgery

## 2011-02-20 ENCOUNTER — Emergency Department (HOSPITAL_COMMUNITY): Payer: Medicare Other

## 2011-02-20 LAB — COMPREHENSIVE METABOLIC PANEL
ALT: 15 U/L (ref 0–35)
AST: 15 U/L (ref 0–37)
Albumin: 3.2 g/dL — ABNORMAL LOW (ref 3.5–5.2)
Alkaline Phosphatase: 70 U/L (ref 39–117)
BUN: 10 mg/dL (ref 6–23)
CO2: 25 mEq/L (ref 19–32)
Calcium: 8.7 mg/dL (ref 8.4–10.5)
Chloride: 101 mEq/L (ref 96–112)
Creatinine, Ser: 0.78 mg/dL (ref 0.50–1.10)
GFR calc Af Amer: >90 mL/min (ref >90)
GFR calc non Af Amer: 85 mL/min — ABNORMAL LOW (ref >90)
Glucose, Bld: 126 mg/dL — ABNORMAL HIGH (ref 70–99)
Potassium: 3.6 mEq/L (ref 3.5–5.1)
Sodium: 134 mEq/L — ABNORMAL LOW (ref 135–145)
Total Bilirubin: 0.6 mg/dL (ref 0.3–1.2)
Total Protein: 5.6 g/dL — ABNORMAL LOW (ref 6.0–8.3)

## 2011-02-20 LAB — TSH: TSH: 1.921 u[IU]/mL (ref 0.350–4.500)

## 2011-02-20 MED ORDER — TRAZODONE 25 MG HALF TABLET
25.0000 mg | ORAL_TABLET | Freq: Every evening | ORAL | Status: DC | PRN
  Administered 2011-02-20: 25 mg via ORAL
  Filled 2011-02-20: qty 1

## 2011-02-20 NOTE — ED Notes (Signed)
Pt assisted to restroom, no further needs at this time.

## 2011-02-20 NOTE — Progress Notes (Signed)
Subjective: She feels fine, no flatus, lots of questions, I told her long term diet discussion could be with DR.Mann. She noticed some difference with her change in underwear, but no real pain.    Objective: Vital signs in last 24 hours: Temp:  [98.2 F (36.8 C)-98.7 F (37.1 C)] 98.2 F (36.8 C) (01/10 0607) Pulse Rate:  [52-71] 52  (01/10 0607) Resp:  [16-20] 18  (01/10 0607) BP: (115-153)/(65-77) 115/71 mmHg (01/10 0607) SpO2:  [95 %-99 %] 97 % (01/10 0607)    Intake/Output from previous day:   Intake/Output this shift:    PE:  Alert, NAD. ABD: soft, +BS, she is not distended.  Not tender..   Lab Results:   Basename 02/19/11 0655 02/19/11 0650  WBC -- 9.8  HGB 14.6 13.2  HCT 43.0 40.2  PLT -- 224    BMET  Basename 02/20/11 0600 02/19/11 0655 02/19/11 0650  NA 134* 136 --  K 3.6 3.9 --  CL 101 98 --  CO2 25 -- 26  GLUCOSE 126* 133* --  BUN 10 20 --  CREATININE 0.78 0.90 --  CALCIUM 8.7 -- 10.1   PT/INR No results found for this basename: LABPROT:2,INR:2 in the last 72 hours   Studies/Results: Abd 1 View (kub)  02/20/2011  *RADIOLOGY REPORT*  Clinical Data: Small bowel obstruction.  ABDOMEN - 1 VIEW  Comparison: 02/19/2011.  Findings: Improved small bowel obstruction with oral contrast extending into the colon.  Surgical clips compatible with pelvic lymphadenectomy.  Increased density of the bones of the pelvis without focal destructive lesions.  IMPRESSION: Resolving small bowel obstruction.  Original Report Authenticated By: Andreas Newport, M.D.   Ct Chest W Contrast  02/19/2011  *RADIOLOGY REPORT*  Clinical Data:  Abdominal pain, distention, nausea, vomiting, chills, history of partial right nephrectomy for angiomyolipoma, hysterectomy, cervical/endometrial cancer, follow-up lung nodule  CT CHEST, ABDOMEN AND PELVIS WITH CONTRAST  Technique:  Multidetector CT imaging of the chest, abdomen and pelvis was performed following the standard protocol during  bolus administration of intravenous contrast.  Sagittal and coronal MPR images reconstructed from axial data set.  Contrast: OMNIPAQUE IOHEXOL 300 MG/ML IV SOLN.  Dilute oral contrast.  Comparison:  CT chest 09/10/2009, CT abdomen and pelvis 06/11/2007  CT CHEST  Findings: Thoracic vascular structures grossly patent on non dedicated exam. Minimal scattered coronary arterial calcification. No thoracic adenopathy. Surgical clips left axilla question. Tiny right upper lobe pulmonary nodule image 19 stable. Minimal nodularity right major fissure image 29 stable. Interval decrease in size of right lower lobe nodule 4 x 2 mm image 38, previously 6 x 3 mm. Mild dependent atelectasis at lung bases. Lungs otherwise clear. No acute osseous findings. Skin mild scattered degenerative disc disease changes thoracic spine.  IMPRESSION: Tiny right lung pulmonary nodule stable since previous exam. Minimal dependent atelectasis at lung bases.  CT ABDOMEN AND PELVIS  Findings: Postsurgical deformity upper pole right kidney. Low attenuation focus laterally in spleen 10 x 8 mm image 61 unchanged. Small left renal cyst unchanged. Liver, spleen, pancreas, kidneys, and adrenal glands otherwise normal appearance. Retroperitoneal surgical clips extending into pelvis bilaterally. Small amount nonspecific free pelvic fluid. Uterus surgically absent with nonvisualization of the ovaries and appendix.  Dilatation of the mid small bowel loops with normal caliber terminal ileum. Findings are compatible with distal small bowel obstruction.  Mild bowel wall thickening of a few distal small bowel loops, nonspecific. Colon and stomach decompressed and unremarkable. No mass, adenopathy, or hernia. Chronic grade  II anterolisthesis L5-S1 with question fusion.  IMPRESSION: Distal small bowel obstruction. Mild bowel wall edema/thickening identified at distal small bowel, nonspecific; this can be seen with inflammatory bowel disease, infection,  high-grade obstruction, ischemia, and prior radiation therapy. Small bowel dilatation and mild distal small bowel wall thickening are new since previous exam. Remaining intra abdominal findings are stable.  Original Report Authenticated By: Lollie Marrow, M.D.   Ct Abdomen Pelvis W Contrast  02/19/2011  *RADIOLOGY REPORT*  Clinical Data:  Abdominal pain, distention, nausea, vomiting, chills, history of partial right nephrectomy for angiomyolipoma, hysterectomy, cervical/endometrial cancer, follow-up lung nodule  CT CHEST, ABDOMEN AND PELVIS WITH CONTRAST  Technique:  Multidetector CT imaging of the chest, abdomen and pelvis was performed following the standard protocol during bolus administration of intravenous contrast.  Sagittal and coronal MPR images reconstructed from axial data set.  Contrast: OMNIPAQUE IOHEXOL 300 MG/ML IV SOLN.  Dilute oral contrast.  Comparison:  CT chest 09/10/2009, CT abdomen and pelvis 06/11/2007  CT CHEST  Findings: Thoracic vascular structures grossly patent on non dedicated exam. Minimal scattered coronary arterial calcification. No thoracic adenopathy. Surgical clips left axilla question. Tiny right upper lobe pulmonary nodule image 19 stable. Minimal nodularity right major fissure image 29 stable. Interval decrease in size of right lower lobe nodule 4 x 2 mm image 38, previously 6 x 3 mm. Mild dependent atelectasis at lung bases. Lungs otherwise clear. No acute osseous findings. Skin mild scattered degenerative disc disease changes thoracic spine.  IMPRESSION: Tiny right lung pulmonary nodule stable since previous exam. Minimal dependent atelectasis at lung bases.  CT ABDOMEN AND PELVIS  Findings: Postsurgical deformity upper pole right kidney. Low attenuation focus laterally in spleen 10 x 8 mm image 61 unchanged. Small left renal cyst unchanged. Liver, spleen, pancreas, kidneys, and adrenal glands otherwise normal appearance. Retroperitoneal surgical clips extending into  pelvis bilaterally. Small amount nonspecific free pelvic fluid. Uterus surgically absent with nonvisualization of the ovaries and appendix.  Dilatation of the mid small bowel loops with normal caliber terminal ileum. Findings are compatible with distal small bowel obstruction.  Mild bowel wall thickening of a few distal small bowel loops, nonspecific. Colon and stomach decompressed and unremarkable. No mass, adenopathy, or hernia. Chronic grade II anterolisthesis L5-S1 with question fusion.  IMPRESSION: Distal small bowel obstruction. Mild bowel wall edema/thickening identified at distal small bowel, nonspecific; this can be seen with inflammatory bowel disease, infection, high-grade obstruction, ischemia, and prior radiation therapy. Small bowel dilatation and mild distal small bowel wall thickening are new since previous exam. Remaining intra abdominal findings are stable.  Original Report Authenticated By: Lollie Marrow, M.D.    Anti-infectives: Anti-infectives     Start     Dose/Rate Route Frequency Ordered Stop   02/19/11 1745   ciprofloxacin (CIPRO) IVPB 400 mg        400 mg 200 mL/hr over 60 Minutes Intravenous Every 12 hours 02/19/11 1730     02/19/11 1745   metroNIDAZOLE (FLAGYL) IVPB 500 mg        500 mg 100 mL/hr over 60 Minutes Intravenous Every 8 hours 02/19/11 1730           Current Facility-Administered Medications  Medication Dose Route Frequency Provider Last Rate Last Dose  . acetaminophen (TYLENOL) tablet 650 mg  650 mg Oral Q6H PRN Mutaz A Elmahi       Or  . acetaminophen (TYLENOL) suppository 650 mg  650 mg Rectal Q6H PRN Mutaz A Elmahi      .  benazepril (LOTENSIN) tablet 20 mg  20 mg Oral Daily Mutaz A Elmahi   20 mg at 02/19/11 2257   And  . hydrochlorothiazide (MICROZIDE) capsule 12.5 mg  12.5 mg Oral Daily Mutaz A Elmahi   12.5 mg at 02/19/11 2257  . ciprofloxacin (CIPRO) IVPB 400 mg  400 mg Intravenous Q12H Mutaz A Elmahi   400 mg at 02/20/11 0559  . dextrose 5 %  and 0.45 % NaCl with KCl 10 mEq/L infusion   Intravenous Continuous Mutaz A Elmahi 100 mL/hr at 02/19/11 2323    . diphenhydrAMINE (BENADRYL) injection 50 mg  50 mg Intravenous QHS PRN Charna Elizabeth, MD   50 mg at 02/19/11 2327  . enoxaparin (LOVENOX) injection 40 mg  40 mg Subcutaneous Q24H Mutaz A Elmahi   40 mg at 02/19/11 2257  . iohexol (OMNIPAQUE) 300 MG/ML solution 100 mL  100 mL Intravenous Once PRN Medication Radiologist   100 mL at 02/19/11 0906  . levothyroxine (SYNTHROID, LEVOTHROID) tablet 75 mcg  75 mcg Oral Daily Mutaz A Elmahi   75 mcg at 02/19/11 2255  . metroNIDAZOLE (FLAGYL) IVPB 500 mg  500 mg Intravenous Q8H Mutaz A Elmahi   500 mg at 02/20/11 0244  . morphine 2 MG/ML injection 1-2 mg  1-2 mg Intravenous Q4H PRN Mutaz A Elmahi      . simvastatin (ZOCOR) tablet 20 mg  20 mg Oral QPM Mutaz A Elmahi   20 mg at 02/19/11 2256  . sodium chloride 0.9 % bolus 1,000 mL  1,000 mL Intravenous Once Vida Roller, MD   1,000 mL at 02/19/11 0907  . DISCONTD: benazepril-hydrochlorthiazide (LOTENSIN HCT) 20-12.5 MG per tablet 1 tablet  1 tablet Oral Daily Mutaz A Elmahi       Current Outpatient Prescriptions  Medication Sig Dispense Refill  . benazepril-hydrochlorthiazide (LOTENSIN HCT) 20-12.5 MG per tablet Take 1 tablet by mouth daily.      Marland Kitchen levothyroxine (SYNTHROID, LEVOTHROID) 75 MCG tablet Take 75 mcg by mouth daily.      . simvastatin (ZOCOR) 20 MG tablet Take 20 mg by mouth every evening.        Assessment/Plan:  PSBO HX.of Cervical/endometrial CA, with Radical Hysterectomy/radiation Rx. Hx. SBO Hx partial nephrectomy 07/2009 Hypertension Hypothyroid dz.   Plan:  No nausea, no real pain, Contrast has moved into colon.  Will follow.          LOS: 1 day    Catelin Manthe 02/20/2011

## 2011-02-20 NOTE — ED Notes (Signed)
Pt placed on hospital bed

## 2011-02-20 NOTE — Progress Notes (Signed)
Patient ID: [name] Richards, female   DOB: [birth year], 66 y.o.   MRN: [id number] Subjective: The patient feels well.  No abdominal pain.  Objective: Vital signs in last 24 hours: Temp:  [98.1 F (36.7 C)-98.5 F (36.9 C)] 98.5 F (36.9 C) (01/10 1515) Pulse Rate:  [52-63] 63  (01/10 1515) Resp:  [18] 18  (01/10 1515) BP: (115-149)/(71-80) 149/80 mmHg (01/10 1515) SpO2:  [97 %-98 %] 98 % (01/10 1457) Weight:  [67.586 kg (149 lb)] 67.586 kg (149 lb) (01/10 1538)    Intake/Output from previous day:   Intake/Output this shift: Total I/O In: -  Out: 500 [Urine:500]  General appearance: alert and no distress GI: soft, non-tender; bowel sounds normal; no masses,  no organomegaly  Lab Results:  Basename 02/19/11 0655 02/19/11 0650  WBC -- 9.8  HGB 14.6 13.2  HCT 43.0 40.2  PLT -- 224   BMET  Basename 02/20/11 0600 02/19/11 0655 02/19/11 0650  NA 134* 136 134*  K 3.6 3.9 3.7  CL 101 98 94*  CO2 25 -- 26  GLUCOSE 126* 133* 131*  BUN 10 20 18   CREATININE 0.78 0.90 0.69  CALCIUM 8.7 -- 10.1   LFT  Basename 02/20/11 0600  PROT 5.6*  ALBUMIN 3.2*  AST 15  ALT 15  ALKPHOS 70  BILITOT 0.6  BILIDIR --  IBILI --   PT/INR No results found for this basename: LABPROT:2,INR:2 in the last 72 hours Hepatitis Panel No results found for this basename: HEPBSAG,HCVAB,HEPAIGM,HEPBIGM in the last 72 hours C-Diff No results found for this basename: CDIFFTOX:3 in the last 72 hours Fecal Lactopherrin No results found for this basename: FECLLACTOFRN in the last 72 hours  Studies/Results: Abd 1 View (kub)  02/20/2011  *RADIOLOGY REPORT*  Clinical Data: Small bowel obstruction.  ABDOMEN - 1 VIEW  Comparison: 02/19/2011.  Findings: Improved small bowel obstruction with oral contrast extending into the colon.  Surgical clips compatible with pelvic lymphadenectomy.  Increased density of the bones of the pelvis without focal destructive lesions.  IMPRESSION: Resolving small bowel  obstruction.  Original Report Authenticated By: Andreas Newport, M.D.   Ct Chest W Contrast  02/19/2011  *RADIOLOGY REPORT*  Clinical Data:  Abdominal pain, distention, nausea, vomiting, chills, history of partial right nephrectomy for angiomyolipoma, hysterectomy, cervical/endometrial cancer, follow-up lung nodule  CT CHEST, ABDOMEN AND PELVIS WITH CONTRAST  Technique:  Multidetector CT imaging of the chest, abdomen and pelvis was performed following the standard protocol during bolus administration of intravenous contrast.  Sagittal and coronal MPR images reconstructed from axial data set.  Contrast: OMNIPAQUE IOHEXOL 300 MG/ML IV SOLN.  Dilute oral contrast.  Comparison:  CT chest 09/10/2009, CT abdomen and pelvis 06/11/2007  CT CHEST  Findings: Thoracic vascular structures grossly patent on non dedicated exam. Minimal scattered coronary arterial calcification. No thoracic adenopathy. Surgical clips left axilla question. Tiny right upper lobe pulmonary nodule image 19 stable. Minimal nodularity right major fissure image 29 stable. Interval decrease in size of right lower lobe nodule 4 x 2 mm image 38, previously 6 x 3 mm. Mild dependent atelectasis at lung bases. Lungs otherwise clear. No acute osseous findings. Skin mild scattered degenerative disc disease changes thoracic spine.  IMPRESSION: Tiny right lung pulmonary nodule stable since previous exam. Minimal dependent atelectasis at lung bases.  CT ABDOMEN AND PELVIS  Findings: Postsurgical deformity upper pole right kidney. Low attenuation focus laterally in spleen 10 x 8 mm image 61 unchanged. Small left renal cyst unchanged. Liver,  spleen, pancreas, kidneys, and adrenal glands otherwise normal appearance. Retroperitoneal surgical clips extending into pelvis bilaterally. Small amount nonspecific free pelvic fluid. Uterus surgically absent with nonvisualization of the ovaries and appendix.  Dilatation of the mid small bowel loops with normal caliber  terminal ileum. Findings are compatible with distal small bowel obstruction.  Mild bowel wall thickening of a few distal small bowel loops, nonspecific. Colon and stomach decompressed and unremarkable. No mass, adenopathy, or hernia. Chronic grade II anterolisthesis L5-S1 with question fusion.  IMPRESSION: Distal small bowel obstruction. Mild bowel wall edema/thickening identified at distal small bowel, nonspecific; this can be seen with inflammatory bowel disease, infection, high-grade obstruction, ischemia, and prior radiation therapy. Small bowel dilatation and mild distal small bowel wall thickening are new since previous exam. Remaining intra abdominal findings are stable.  Original Report Authenticated By: Lollie Marrow, M.D.   Ct Abdomen Pelvis W Contrast  02/19/2011  *RADIOLOGY REPORT*  Clinical Data:  Abdominal pain, distention, nausea, vomiting, chills, history of partial right nephrectomy for angiomyolipoma, hysterectomy, cervical/endometrial cancer, follow-up lung nodule  CT CHEST, ABDOMEN AND PELVIS WITH CONTRAST  Technique:  Multidetector CT imaging of the chest, abdomen and pelvis was performed following the standard protocol during bolus administration of intravenous contrast.  Sagittal and coronal MPR images reconstructed from axial data set.  Contrast: OMNIPAQUE IOHEXOL 300 MG/ML IV SOLN.  Dilute oral contrast.  Comparison:  CT chest 09/10/2009, CT abdomen and pelvis 06/11/2007  CT CHEST  Findings: Thoracic vascular structures grossly patent on non dedicated exam. Minimal scattered coronary arterial calcification. No thoracic adenopathy. Surgical clips left axilla question. Tiny right upper lobe pulmonary nodule image 19 stable. Minimal nodularity right major fissure image 29 stable. Interval decrease in size of right lower lobe nodule 4 x 2 mm image 38, previously 6 x 3 mm. Mild dependent atelectasis at lung bases. Lungs otherwise clear. No acute osseous findings. Skin mild scattered  degenerative disc disease changes thoracic spine.  IMPRESSION: Tiny right lung pulmonary nodule stable since previous exam. Minimal dependent atelectasis at lung bases.  CT ABDOMEN AND PELVIS  Findings: Postsurgical deformity upper pole right kidney. Low attenuation focus laterally in spleen 10 x 8 mm image 61 unchanged. Small left renal cyst unchanged. Liver, spleen, pancreas, kidneys, and adrenal glands otherwise normal appearance. Retroperitoneal surgical clips extending into pelvis bilaterally. Small amount nonspecific free pelvic fluid. Uterus surgically absent with nonvisualization of the ovaries and appendix.  Dilatation of the mid small bowel loops with normal caliber terminal ileum. Findings are compatible with distal small bowel obstruction.  Mild bowel wall thickening of a few distal small bowel loops, nonspecific. Colon and stomach decompressed and unremarkable. No mass, adenopathy, or hernia. Chronic grade II anterolisthesis L5-S1 with question fusion.  IMPRESSION: Distal small bowel obstruction. Mild bowel wall edema/thickening identified at distal small bowel, nonspecific; this can be seen with inflammatory bowel disease, infection, high-grade obstruction, ischemia, and prior radiation therapy. Small bowel dilatation and mild distal small bowel wall thickening are new since previous exam. Remaining intra abdominal findings are stable.  Original Report Authenticated By: Lollie Marrow, M.D.    Medications:  Scheduled:   . benazepril  20 mg Oral Daily   And  . hydrochlorothiazide  12.5 mg Oral Daily  . ciprofloxacin  400 mg Intravenous Q12H  . enoxaparin  40 mg Subcutaneous Q24H  . levothyroxine  75 mcg Oral Daily  . metronidazole  500 mg Intravenous Q8H  . simvastatin  20 mg Oral QPM  .  DISCONTD: benazepril-hydrochlorthiazide  1 tablet Oral Daily   Continuous:   . dextrose 5 % and 0.45 % NaCl with KCl 10 mEq/L 100 mL/hr at 02/19/11 2323    Assessment/Plan: 1) SBO - It appears to be  resolving.  No flatus or bowel movements yet.    Plan: 1) ? D/C tomorrow. 2) Further evaluation as an outpatient by Dr. Loreta Ave.  LOS: 1 day   Inocente Krach D 02/20/2011, 4:16 PM

## 2011-02-20 NOTE — ED Notes (Signed)
MD at bedside. 

## 2011-02-20 NOTE — Progress Notes (Signed)
PSBO resolving by Xray & not clinically worse.  The patient is stable.  There is no evidence of peritonitis, acute abdomen, nor shock.  There is no strong evidence of failure of improvement nor decline with current non-operative management.  There is no need for surgery at the present moment.  With prior major pelvis surgery & pelvic radiation, surgical risks of leak/fistula/etc are much higher = needs a more dire situation with failure of all other options before contemplating OR  Will prob need low residue diet for a while.  Avoiding nuts, popcorn, etc would be helpful (like an ileostomy diet).  Dr. Loreta Ave has known her longer, so she is the best resource for this

## 2011-02-20 NOTE — Progress Notes (Signed)
DAILY PROGRESS NOTE                              [facility]  SUBJECTIVE: No bowel movements or flatus since yesterday. Denies nausea or vomiting. Abdominal x-ray showed resolution of the SBO  OBJECTIVE: BP 115/71  Pulse 52  Temp(Src) 98.2 F (36.8 C) (Oral)  Resp 18  Wt 68.04 kg (150 lb)  SpO2 97% No intake or output data in the 24 hours ending February 23, 2011 1329                    Weight change:  Physical Exam: General: Alert and awake oriented x3 not in any acute distress. HEENT: anicteric sclera, pupils equal reactive to light and accommodation CVS: S1-S2 heard, no murmur rubs or gallops Chest: clear to auscultation bilaterally, no wheezing rales or rhonchi Abdomen:  normal bowel sounds, soft, nontender, nondistended, no organomegaly Neuro: Cranial nerves II-XII intact, no focal neurological deficits Extremities: no cyanosis, no clubbing or edema noted bilaterally   Lab Results:  Basename 02-23-2011 0600 02/19/11 0655 02/19/11 0650  NA 134* 136 --  K 3.6 3.9 --  CL 101 98 --  CO2 25 -- 26  GLUCOSE 126* 133* --  BUN 10 20 --  CREATININE 0.78 0.90 --  CALCIUM 8.7 -- 10.1  MG -- -- --  PHOS -- -- --    Basename 23-Feb-2011 0600 02/19/11 0650  AST 15 22  ALT 15 24  ALKPHOS 70 90  BILITOT 0.6 0.4  PROT 5.6* 7.4  ALBUMIN 3.2* 4.2    Basename 02/19/11 0650  LIPASE 40  AMYLASE --    Basename 02/19/11 0655 02/19/11 0650  WBC -- 9.8  NEUTROABS -- 8.4*  HGB 14.6 13.2  HCT 43.0 40.2  MCV -- 66.1*  PLT -- 224   Studies/Results: Abd 1 View (kub)February 23, 2011   IMPRESSION: Resolving small bowel obstruction.  Original Report Authenticated By: Andreas Newport, M.D.   Ct Abdomen Pelvis W Contrast1/10/2011  IMPRESSION: Distal small bowel obstruction. Mild bowel wall edema/thickening identified at distal small bowel, nonspecific; this can be seen with inflammatory bowel disease, infection, high-grade obstruction, ischemia, and prior radiation  therapy. Small bowel dilatation and mild distal small bowel wall thickening are new since previous exam. Remaining intra abdominal findings are stable.  Original Report Authenticated By: Lollie Marrow, M.D.   Medications: Scheduled Meds:   . benazepril  20 mg Oral Daily   And  . hydrochlorothiazide  12.5 mg Oral Daily  . ciprofloxacin  400 mg Intravenous Q12H  . enoxaparin  40 mg Subcutaneous Q24H  . levothyroxine  75 mcg Oral Daily  . metronidazole  500 mg Intravenous Q8H  . simvastatin  20 mg Oral QPM  . DISCONTD: benazepril-hydrochlorthiazide  1 tablet Oral Daily   Continuous Infusions:   . dextrose 5 % and 0.45 % NaCl with KCl 10 mEq/L 100 mL/hr at 02/19/11 2323   PRN Meds:.acetaminophen, acetaminophen, diphenhydrAMINE, morphine  ASSESSMENT & PLAN: Principal Problem:  *SBO (small bowel obstruction) Active Problems:  HYPERTENSION  Abdominal pain  1. SBO: That was evident and CT scan of abdomen and pelvis. Patient does not have any nausea/vomiting. She felt much better today. KUB showed resolution of the small bowel obstruction, and contrast moved into the colon. Patient is n.p.o. will put on clear liquids in the care of the surgical service.  2. HTN: Unknown medications stable.  3. Distal  ileitis, mild: With mild bowel wall edema/thickening identified in the CT scan. Patient is empirically on antibiotics on Cipro and Flagyl. This might represent inflammatory bowel disease versus infectious process. GI is following.   LOS: 1 day   Wasyl Dornfeld A 02/20/2011, 1:29 PM

## 2011-02-20 NOTE — ED Notes (Signed)
Will, PA from [facility] at bedside consulting with pt.

## 2011-02-21 DIAGNOSIS — K5000 Crohn's disease of small intestine without complications: Secondary | ICD-10-CM | POA: Diagnosis present

## 2011-02-21 LAB — BASIC METABOLIC PANEL
BUN: 6 mg/dL (ref 6–23)
CO2: 27 mEq/L (ref 19–32)
Calcium: 9 mg/dL (ref 8.4–10.5)
Chloride: 101 mEq/L (ref 96–112)
Creatinine, Ser: 0.79 mg/dL (ref 0.50–1.10)
GFR calc Af Amer: >90 mL/min (ref >90)
GFR calc non Af Amer: 85 mL/min — ABNORMAL LOW (ref >90)
Glucose, Bld: 120 mg/dL — ABNORMAL HIGH (ref 70–99)
Potassium: 3.5 mEq/L (ref 3.5–5.1)
Sodium: 135 mEq/L (ref 135–145)

## 2011-02-21 NOTE — Progress Notes (Signed)
Pt has been educated regarding correct use of Incentive Spirometer (IS). Pt verbalizes understanding of correct use. Pt states that there is no further questions regarding correct use of the IS. [name] Keim Lorraine Ijanae Macapagal, RN    

## 2011-02-21 NOTE — Progress Notes (Signed)
SBO resolved Low residue diet as rec before Bowel regimen Mobilize w walking 1 hour/day  The patient is stable.  There is no evidence of peritonitis, acute abdomen, nor shock.  There is no strong evidence of failure of improvement nor decline with current non-operative management.  There is no need for surgery at the present moment.  Call us w questions/concerns

## 2011-02-21 NOTE — Progress Notes (Signed)
Subjective: She feels fine, 2 bowel movements, wants to eat real food and go home.     Objective: Vital signs in last 24 hours: Temp:  [97.9 F (36.6 C)-98.5 F (36.9 C)] 98.2 F (36.8 C) (01/11 0553) Pulse Rate:  [51-63] 60  (01/11 0553) Resp:  [18] 18  (01/11 0553) BP: (116-149)/(74-80) 138/78 mmHg (01/11 0553) SpO2:  [98 %] 98 % (01/11 0553) Weight:  [67.586 kg (149 lb)] 67.586 kg (149 lb) (01/10 1538) Last BM Date: 02/21/11  Intake/Output from previous day: 01/10 0701 - 01/11 0700 In: 4721.7 [P.O.:360; I.V.:3061.7; IV Piggyback:1300] Out: 1900 [Urine:1900] Intake/Output this shift:    PE:  Alert, NAD. ABD: soft, +BS, she is not distended.  Not tender.Marland Kitchen +BM x 2.  Lab Results:   Basename 02/19/11 0655 02/19/11 0650  WBC -- 9.8  HGB 14.6 13.2  HCT 43.0 40.2  PLT -- 224    BMET  Basename 02/21/11 0415 02/20/11 0600  NA 135 134*  K 3.5 3.6  CL 101 101  CO2 27 25  GLUCOSE 120* 126*  BUN 6 10  CREATININE 0.79 0.78  CALCIUM 9.0 8.7   PT/INR No results found for this basename: LABPROT:2,INR:2 in the last 72 hours   Studies/Results: Abd 1 View (kub)  02/20/2011  *RADIOLOGY REPORT*  Clinical Data: Small bowel obstruction.  ABDOMEN - 1 VIEW  Comparison: 02/19/2011.  Findings: Improved small bowel obstruction with oral contrast extending into the colon.  Surgical clips compatible with pelvic lymphadenectomy.  Increased density of the bones of the pelvis without focal destructive lesions.  IMPRESSION: Resolving small bowel obstruction.  Original Report Authenticated By: Andreas Newport, M.D.    Anti-infectives: Anti-infectives     Start     Dose/Rate Route Frequency Ordered Stop   02/19/11 1745   ciprofloxacin (CIPRO) IVPB 400 mg        400 mg 200 mL/hr over 60 Minutes Intravenous Every 12 hours 02/19/11 1730     02/19/11 1745   metroNIDAZOLE (FLAGYL) IVPB 500 mg        500 mg 100 mL/hr over 60 Minutes Intravenous Every 8 hours 02/19/11 1730            Current Facility-Administered Medications  Medication Dose Route Frequency Provider Last Rate Last Dose  . acetaminophen (TYLENOL) tablet 650 mg  650 mg Oral Q6H PRN Mutaz A Elmahi       Or  . acetaminophen (TYLENOL) suppository 650 mg  650 mg Rectal Q6H PRN Mutaz A Elmahi      . benazepril (LOTENSIN) tablet 20 mg  20 mg Oral Daily Mutaz A Elmahi   20 mg at 02/21/11 0943   And  . hydrochlorothiazide (MICROZIDE) capsule 12.5 mg  12.5 mg Oral Daily Mutaz A Elmahi   12.5 mg at 02/21/11 0943  . ciprofloxacin (CIPRO) IVPB 400 mg  400 mg Intravenous Q12H Mutaz A Elmahi   400 mg at 02/21/11 0540  . dextrose 5 % and 0.45 % NaCl with KCl 10 mEq/L infusion   Intravenous Continuous Mutaz A Elmahi 100 mL/hr at 02/21/11 0106    . enoxaparin (LOVENOX) injection 40 mg  40 mg Subcutaneous Q24H Mutaz A Elmahi   40 mg at 02/19/11 2257  . levothyroxine (SYNTHROID, LEVOTHROID) tablet 75 mcg  75 mcg Oral Daily Mutaz A Elmahi   75 mcg at 02/21/11 0943  . metroNIDAZOLE (FLAGYL) IVPB 500 mg  500 mg Intravenous Q8H Mutaz A Elmahi   500 mg at 02/21/11 0937  .  morphine 2 MG/ML injection 1-2 mg  1-2 mg Intravenous Q4H PRN Mutaz A Elmahi      . simvastatin (ZOCOR) tablet 20 mg  20 mg Oral QPM Mutaz A Elmahi   20 mg at 02/20/11 1817  . traZODone (DESYREL) tablet 25 mg  25 mg Oral QHS PRN Mutaz A Elmahi   25 mg at 02/20/11 2225  . DISCONTD: diphenhydrAMINE (BENADRYL) injection 50 mg  50 mg Intravenous QHS PRN Charna Elizabeth, MD   50 mg at 02/19/11 2327    Assessment/Plan:  PSBO HX.of Cervical/endometrial CA, with Radical Hysterectomy/radiation Rx. Hx. SBO Hx partial nephrectomy 07/2009 Hypertension Hypothyroid dz.   Plan: 2 BM's exam negative.  Advance diet, No surgical need currently.         LOS: 2 days    Raidyn Breiner 02/21/2011

## 2011-02-21 NOTE — Progress Notes (Signed)
Pt being d/c to the home. Pt verbalizes understanding of all d/c instructions and states there are no additional questions. Pt taken to car via wheelchair. Rashiya Lofland Lorraine Mariyanna Mucha, RN  

## 2011-02-21 NOTE — Discharge Summary (Signed)
HOSPITAL DISCHARGE SUMMARY  SHAKEITA VANDEVANDER  MRN: [id number]  DOB:[birth year]  Date of Admission: 02/19/2011 Date of Discharge: 02/21/2011         LOS: 2 days   Attending Physician:Xayne Brumbaugh A  Patient's ION:GEXBM,WUXLKGM S, MD, MD  Consults: Treatment Team:  [facility] Charna Elizabeth, MD   Discharge Diagnoses: Present on Admission:  .SBO (small bowel obstruction) .HYPERTENSION .Abdominal pain .Regional enteritis of ileum   Current Discharge Medication List    CONTINUE these medications which have NOT CHANGED   Details  benazepril-hydrochlorthiazide (LOTENSIN HCT) 20-12.5 MG per tablet Take 1 tablet by mouth daily.    levothyroxine (SYNTHROID, LEVOTHROID) 75 MCG tablet Take 75 mcg by mouth daily.    simvastatin (ZOCOR) 20 MG tablet Take 20 mg by mouth every evening.         Brief Admission History: BRISEYDA FEHR is a 66 y.o. female with past medical history of cervical cancer and endometrial cancer. Patient had previous for abdominal surgeries. Patient came into the hospital complaining about abdominal pain and distention. These symptoms followed by nausea and 5 loose bowel movement. Patient patient reported similar episodes in the past. Being followed by Dr. Loreta Ave, and she suggested according to the patient bacterial overgrowth. Patient was taking probiotics. Upon initial evaluation in the emergency department CT scan of abdomen and pelvis was done and showed small bowel obstruction with distal ileum thickening. Patient will be admitted to the hospital for further evaluation.  Hospital Course: Present on Admission:  .SBO (small bowel obstruction) .HYPERTENSION .Abdominal pain .Regional enteritis of ileum  1. Small bowel obstruction: Resolved. Unknown etiology might be secondary to previous multiple abdominal surgeries. There is regional enteritis in the distal ileum which was treated initially with antibiotics. Patient was put n.p.o. at the time of admission and then the  guide was advanced slowly. Patient to followup by serial clinical and radiological examination which both showed resolution of the small bowel obstruction. Patient was on full liquids and she did have 2 bowel movements and she was okayed to be discharged. I consulted gastroenterology and general surgery service when she was in the hospital.  2. Regional enteritis of the ileum: At the time of admission patient was evaluated by CT scan of abdomen pelvis which showed mild thickening of distal ileum wall. This can be secondary to obstruction, infectious process for inflammatory bowel disease. Patient initially started on Cipro and Flagyl. Patient did not have any fever or leukocytosis. So I feel is not necessary to give antibiotics at the time of discharge. Patient to followup with Dr. Loreta Ave for further evaluation and to rule out IBD.  3. HTN: Continue home medication. Patient blood pressure was controlled while she is in the hospital.  Day of Discharge BP 138/78  Pulse 60  Temp(Src) 98.2 F (36.8 C) (Oral)  Resp 18  Ht 5' 5.5" (1.664 m)  Wt 67.586 kg (149 lb)  BMI 24.42 kg/m2  SpO2 98% Physical Exam: GEN: No acute distress, cooperative with exam PSYCH: He is alert and oriented x4; does not appear anxious does not appear depressed; affect is normal  HEENT: Mucous membranes pink and anicteric;  Mouth: without oral thrush or lesions Eyes: PERRLA; EOM intact;  Neck: no cervical lymphadenopathy nor thyromegaly or carotid bruit; no JVD;  CHEST WALL: No tenderness, symmetrical to breathing bilaterally CHEST: Normal respiration, clear to auscultation bilaterally  HEART: Regular rate and rhythm; no murmurs, rubs or gallops, S1 and S2 heard  BACK: No kyphosis or scoliosis; no  CVA tenderness  ABDOMEN:  soft non-tender; no masses, no organomegaly, normal abdominal bowel sounds; no pannus; no intertriginous candida.  EXTREMITIES: No bone or joint deformity; no edema; no ulcerations.  PULSES: 2+ and  symmetric, neurovascularity is intact SKIN: Normal hydration no rash or ulceration, no flushing or suspicious lesions  CNS: Cranial nerves 2-12 grossly intact no focal neurologic deficit, coordination is intact gait not tested    Results for orders placed during the hospital encounter of 02/19/11 (from the past 24 hour(s))  BASIC METABOLIC PANEL     Status: Abnormal   Collection Time   02/21/11  4:15 AM      Component Value Range   Sodium 135  135 - 145 (mEq/L)   Potassium 3.5  3.5 - 5.1 (mEq/L)   Chloride 101  96 - 112 (mEq/L)   CO2 27  19 - 32 (mEq/L)   Glucose, Bld 120 (*) 70 - 99 (mg/dL)   BUN 6  6 - 23 (mg/dL)   Creatinine, Ser 4.09  0.50 - 1.10 (mg/dL)   Calcium 9.0  8.4 - 81.1 (mg/dL)   GFR calc non Af Amer 85 (*) >90 (mL/min)   GFR calc Af Amer >90  >90 (mL/min)    Disposition: Home   Follow-up Appts: Discharge Orders    Future Appointments: Provider: Department: Dept Phone: Center:   03/24/2011 8:30 AM Jeannette Corpus, MD [facility] [phone] None     Future Orders Please Complete By Expires   Diet - low sodium heart healthy      Comments:   Bland Diet.   Increase activity slowly         Follow-up Information    Follow up with Cala Bradford, MD. Schedule an appointment as soon as possible for a visit in 2 weeks.      Follow up with MANN,JYOTHI, MD. Schedule an appointment as soon as possible for a visit in 1 week.   Contact information:   [address] [place] [address] [phone]          I spent 40 minutes completing paperwork and coordinating discharge efforts.  SignedClydia Llano A 02/21/2011, 2:25 PM

## 2011-03-08 ENCOUNTER — Encounter (HOSPITAL_COMMUNITY): Payer: Self-pay | Admitting: Emergency Medicine

## 2011-03-08 ENCOUNTER — Emergency Department (HOSPITAL_COMMUNITY): Payer: Medicare Other

## 2011-03-08 ENCOUNTER — Observation Stay (HOSPITAL_COMMUNITY)
Admission: EM | Admit: 2011-03-08 | Discharge: 2011-03-08 | Disposition: A | Discharge status: Home / Self Care | Payer: Medicare Other | Attending: Internal Medicine | Admitting: Internal Medicine

## 2011-03-08 DIAGNOSIS — K5000 Crohn's disease of small intestine without complications: Secondary | ICD-10-CM

## 2011-03-08 DIAGNOSIS — Z9079 Acquired absence of other genital organ(s): Secondary | ICD-10-CM | POA: Insufficient documentation

## 2011-03-08 DIAGNOSIS — Z923 Personal history of irradiation: Secondary | ICD-10-CM | POA: Insufficient documentation

## 2011-03-08 DIAGNOSIS — J309 Allergic rhinitis, unspecified: Secondary | ICD-10-CM

## 2011-03-08 DIAGNOSIS — R112 Nausea with vomiting, unspecified: Secondary | ICD-10-CM | POA: Insufficient documentation

## 2011-03-08 DIAGNOSIS — R109 Unspecified abdominal pain: Principal | ICD-10-CM | POA: Insufficient documentation

## 2011-03-08 DIAGNOSIS — C539 Malignant neoplasm of cervix uteri, unspecified: Secondary | ICD-10-CM

## 2011-03-08 DIAGNOSIS — Z79899 Other long term (current) drug therapy: Secondary | ICD-10-CM | POA: Insufficient documentation

## 2011-03-08 DIAGNOSIS — K56609 Unspecified intestinal obstruction, unspecified as to partial versus complete obstruction: Secondary | ICD-10-CM | POA: Diagnosis present

## 2011-03-08 DIAGNOSIS — J984 Other disorders of lung: Secondary | ICD-10-CM

## 2011-03-08 DIAGNOSIS — J90 Pleural effusion, not elsewhere classified: Secondary | ICD-10-CM

## 2011-03-08 DIAGNOSIS — R0602 Shortness of breath: Secondary | ICD-10-CM

## 2011-03-08 DIAGNOSIS — E785 Hyperlipidemia, unspecified: Secondary | ICD-10-CM

## 2011-03-08 DIAGNOSIS — Z8542 Personal history of malignant neoplasm of other parts of uterus: Secondary | ICD-10-CM | POA: Insufficient documentation

## 2011-03-08 DIAGNOSIS — I10 Essential (primary) hypertension: Secondary | ICD-10-CM

## 2011-03-08 DIAGNOSIS — C549 Malignant neoplasm of corpus uteri, unspecified: Secondary | ICD-10-CM

## 2011-03-08 DIAGNOSIS — Z8541 Personal history of malignant neoplasm of cervix uteri: Secondary | ICD-10-CM | POA: Insufficient documentation

## 2011-03-08 DIAGNOSIS — Z90710 Acquired absence of both cervix and uterus: Secondary | ICD-10-CM | POA: Insufficient documentation

## 2011-03-08 DIAGNOSIS — Z8719 Personal history of other diseases of the digestive system: Secondary | ICD-10-CM

## 2011-03-08 LAB — URINALYSIS, ROUTINE W REFLEX MICROSCOPIC
Bilirubin Urine: NEGATIVE
Glucose, UA: NEGATIVE mg/dL
Hgb urine dipstick: NEGATIVE
Ketones, ur: NEGATIVE mg/dL
Nitrite: NEGATIVE
Protein, ur: NEGATIVE mg/dL
Specific Gravity, Urine: 1.017 (ref 1.005–1.030)
Urobilinogen, UA: 0.2 mg/dL (ref 0.0–1.0)
pH: 8 (ref 5.0–8.0)

## 2011-03-08 LAB — CBC
HCT: 38.9 % (ref 36.0–46.0)
Hemoglobin: 12.9 g/dL (ref 12.0–15.0)
MCH: 21.9 pg — ABNORMAL LOW (ref 26.0–34.0)
MCHC: 33.2 g/dL (ref 30.0–36.0)
MCV: 66 fL — ABNORMAL LOW (ref 78.0–100.0)
Platelets: 230 10*3/uL (ref 150–400)
RBC: 5.89 MIL/uL — ABNORMAL HIGH (ref 3.87–5.11)
RDW: 14.1 % (ref 11.5–15.5)
WBC: 9.8 10*3/uL (ref 4.0–10.5)

## 2011-03-08 LAB — COMPREHENSIVE METABOLIC PANEL
ALT: 21 U/L (ref 0–35)
AST: 20 U/L (ref 0–37)
Albumin: 4.2 g/dL (ref 3.5–5.2)
Alkaline Phosphatase: 83 U/L (ref 39–117)
BUN: 12 mg/dL (ref 6–23)
CO2: 25 mEq/L (ref 19–32)
Calcium: 9.8 mg/dL (ref 8.4–10.5)
Chloride: 96 mEq/L (ref 96–112)
Creatinine, Ser: 0.7 mg/dL (ref 0.50–1.10)
GFR calc Af Amer: >90 mL/min (ref >90)
GFR calc non Af Amer: 88 mL/min — ABNORMAL LOW (ref >90)
Glucose, Bld: 148 mg/dL — ABNORMAL HIGH (ref 70–99)
Potassium: 3.5 mEq/L (ref 3.5–5.1)
Sodium: 134 mEq/L — ABNORMAL LOW (ref 135–145)
Total Bilirubin: 0.5 mg/dL (ref 0.3–1.2)
Total Protein: 6.9 g/dL (ref 6.0–8.3)

## 2011-03-08 LAB — URINE MICROSCOPIC-ADD ON

## 2011-03-08 LAB — LIPASE, BLOOD: Lipase: 40 U/L (ref 11–59)

## 2011-03-08 MED ORDER — BENAZEPRIL HCL 20 MG PO TABS
20.0000 mg | ORAL_TABLET | Freq: Every day | ORAL | Status: DC
  Administered 2011-03-08: 20 mg via ORAL
  Filled 2011-03-08: qty 1

## 2011-03-08 MED ORDER — SODIUM CHLORIDE 0.9 % IV SOLN
INTRAVENOUS | Status: DC
  Administered 2011-03-08: 02:00:00 via INTRAVENOUS

## 2011-03-08 MED ORDER — SIMVASTATIN 20 MG PO TABS
20.0000 mg | ORAL_TABLET | Freq: Every evening | ORAL | Status: DC
  Filled 2011-03-08: qty 1

## 2011-03-08 MED ORDER — ACETAMINOPHEN 650 MG RE SUPP: 650.0000 mg | Freq: Four times a day (QID) | RECTAL | Status: DC | PRN

## 2011-03-08 MED ORDER — ONDANSETRON HCL 4 MG/2ML IJ SOLN
4.0000 mg | Freq: Once | INTRAMUSCULAR | Status: AC
  Administered 2011-03-08: 4 mg via INTRAVENOUS
  Filled 2011-03-08: qty 2

## 2011-03-08 MED ORDER — SODIUM CHLORIDE 0.9 % IV SOLN: INTRAVENOUS | Status: DC

## 2011-03-08 MED ORDER — ONDANSETRON HCL 4 MG/2ML IJ SOLN: 4.0000 mg | Freq: Four times a day (QID) | INTRAMUSCULAR | Status: DC | PRN

## 2011-03-08 MED ORDER — ONDANSETRON HCL 4 MG PO TABS: 4.0000 mg | ORAL_TABLET | Freq: Four times a day (QID) | ORAL | Status: DC | PRN

## 2011-03-08 MED ORDER — MORPHINE SULFATE 4 MG/ML IJ SOLN
4.0000 mg | Freq: Once | INTRAMUSCULAR | Status: AC
  Administered 2011-03-08: 4 mg via INTRAVENOUS
  Filled 2011-03-08: qty 1

## 2011-03-08 MED ORDER — HYDROCHLOROTHIAZIDE 12.5 MG PO CAPS
12.5000 mg | ORAL_CAPSULE | Freq: Every day | ORAL | Status: DC
  Administered 2011-03-08: 12.5 mg via ORAL
  Filled 2011-03-08: qty 1

## 2011-03-08 MED ORDER — LEVOTHYROXINE SODIUM 75 MCG PO TABS
75.0000 ug | ORAL_TABLET | Freq: Every day | ORAL | Status: DC
  Administered 2011-03-08: 75 ug via ORAL
  Filled 2011-03-08: qty 1

## 2011-03-08 MED ORDER — BENAZEPRIL-HYDROCHLOROTHIAZIDE 20-12.5 MG PO TABS: 1.0000 | ORAL_TABLET | Freq: Every day | ORAL | Status: DC

## 2011-03-08 MED ORDER — ACETAMINOPHEN 325 MG PO TABS: 650.0000 mg | ORAL_TABLET | Freq: Four times a day (QID) | ORAL | Status: DC | PRN

## 2011-03-08 MED ORDER — MORPHINE SULFATE 2 MG/ML IJ SOLN
2.0000 mg | INTRAMUSCULAR | Status: DC | PRN
  Administered 2011-03-08: 2 mg via INTRAVENOUS
  Filled 2011-03-08: qty 1

## 2011-03-08 NOTE — Progress Notes (Signed)
Pt d'c home. Will walk to car, steady on feet. Pt signed d'c papers with no questions.

## 2011-03-08 NOTE — ED Notes (Signed)
Report to floor RN, admitting MD at bedside, pt will be transported once assessment is completed

## 2011-03-08 NOTE — H&P (Signed)
PCP:  Cala Bradford, MD, MD Confirmed with pt Dr. Loreta Ave GI   Chief Complaint:  Abd pain, nausea, vomiting   HPI: 65yoF with prior h/o cervical/endometrial ca 1997 s/p radial hysterectomy and abdominal  radiation, HTN, prior SBO in 12/2008 and recent admission 02/2011 for SBO with terminal  ileitis presents with nausea, vomiting, and abdominal pain.   Pt is an excellent historian and reports 3 yr history of episodes of hard stools that then  progress to watery liquid stools, with associated abdominal pain, vomiting, abdominal  distention, and dehydration for which she's been followed by Dr. Loreta Ave in GI and her PCP.  She's had 16 episodes over the years. She notes 6 wks of radiation to her abdomen,  abdominal hysteretomy, and "many surgeries" to her abdomen.   Pt was admitted to [facility] 1/9 to 1/11 for abdominal pain and distention, similar to prior   episodes. CT scan showed SBO and distal ileum thickening, called as obstruction vs  infectious process vs IBD. Started cipro/flagyl, but without any fevers or WBC's this was  not felt necessary on discharge. She was discharged to f/u with Dr. Loreta Ave to r/o IBD.   She saw Dr. Loreta Ave in the few days after discharge and was instructed to increase fiber  intake which she has tried to do, and also saw her PCP. She went on the internet and has  been reading all kinds of information on how to prevent these episodes and avoid the  hospital. She has been feeling well since discharge, goes the [employer] to exercise 6 times a  week, was tolerating food and without complaint until 7pm tonight when she developed  severe abdominal pain, some liquidy diarrhea, nausea, and vomited undigested food from the  day before.   In the ED labs were stable, minimal hypoNa 134, CBC stable. UA without infection. Abd  plain film unremarkable. Observation admission requsted given recent SBO and abdominal   tenderness, vomiting.   Past Medical History  Diagnosis Date  .  Asthma   . Hypertension   . Thyroid disease   . Cervical cancer 1997    poorly differentiated cervical carcinoma. S/p TAH and radiation x6 wks   . Endometrial cancer 1997    coincidental endometrial cancer also  . Hyperlipemia   . 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 Vertebral body hemangioma     T12  . Small bowel obstruction     12/2008, repeat SBO in 02/2011 also with terminal ilietis   . Complication of anesthesia     took 2 days to wake up after 1 surgery    Past Surgical History  Procedure Date  . Back surgery   . Tubal ligation   . Skin biopsy   . Abdominal hysterectomy Feb 1997    TAH/BSO, pelvic and periaortic lymphadenectomy   . Partial nephrectomy     right   HOME MEDS: Reconciled with pt  Prior to Admission medications   Medication Sig Start Date End Date Taking? Authorizing Provider  benazepril-hydrochlorthiazide (LOTENSIN HCT) 20-12.5 MG per tablet Take 1 tablet by mouth daily.   Yes Historical Provider, MD  levothyroxine (SYNTHROID, LEVOTHROID) 75 MCG tablet Take 75 mcg by mouth daily.   Yes Historical Provider, MD  simvastatin (ZOCOR) 20 MG tablet Take 20 mg by mouth every evening.   Yes Historical Provider, MD   Allergies:  Allergies  Allergen Reactions  . Codeine   . Penicillins   . Prochlorperazine  Edisylate    Social History:   reports that she has never smoked. She has never used smokeless tobacco. She reports that she does not drink alcohol or use illicit drugs. Lives at home alone, works out at [employer] 6 days a week, still very active. Has an ex-husband who she is in contact with. Son died of an MI and another son in [place].    Family History: History reviewed. No pertinent family history.  Physical Exam: Filed Vitals:   03/08/11 0112 03/08/11 0254 03/08/11 0416  BP: 143/93 127/80 137/82  Pulse: 85 62 59  Temp: 97.5 F (36.4 C)  98.6 F (37 C)  TempSrc: Oral  Oral  Resp: 16 16 20   Weight:   67 kg (147 lb  11.3 oz)  SpO2: 98% 98% 97%   Blood pressure 137/82, pulse 59, temperature 98.6 F (37 C), temperature source Oral, resp. rate 20, weight 67 kg (147 lb 11.3 oz), SpO2 97.00%.  Gen: Very healthy appearing, non-toxic, non-ill F in no distress, excellent historian, no  distress HEENT: Pupils round and reactive normal appearing, irises/sclera clear. Mouth fairly moist  appearing, overall normal without lesions Neck: Thin, supple, normal  Lungs: CTAB, very good air movement, no adventitious lung sounds, normal exam Heart: Regular, not tachy, S1/2 clear without m/g, normal exam Abd: Not obese, soft not rigid, non peritoneal, not distended, no facial grimacing or TTP  in any quadrant, overall very benign exam Extrem: Warm, normal bulk and tone, perfusing well, bilateral radials palpable, no BLE  edema Neuro: Alert, attentive, CN 2-12 intact, no slurred speech, no facial droop, moves  extremities on her own, grossly non-focal.    Labs & Imaging Results for orders placed during the hospital encounter of 03/08/11 (from the past 48 hour(s))  URINALYSIS, ROUTINE W REFLEX MICROSCOPIC     Status: Abnormal   Collection Time   03/08/11  1:35 AM      Component Value Range Comment   Color, Urine YELLOW  YELLOW     APPearance CLOUDY (*) CLEAR     Specific Gravity, Urine 1.017  1.005 - 1.030     pH 8.0  5.0 - 8.0     Glucose, UA NEGATIVE  NEGATIVE (mg/dL)    Hgb urine dipstick NEGATIVE  NEGATIVE     Bilirubin Urine NEGATIVE  NEGATIVE     Ketones, ur NEGATIVE  NEGATIVE (mg/dL)    Protein, ur NEGATIVE  NEGATIVE (mg/dL)    Urobilinogen, UA 0.2  0.0 - 1.0 (mg/dL)    Nitrite NEGATIVE  NEGATIVE     Leukocytes, UA TRACE (*) NEGATIVE    URINE MICROSCOPIC-ADD ON     Status: Abnormal   Collection Time   03/08/11  1:35 AM      Component Value Range Comment   WBC, UA 0-2  <3 (WBC/hpf)    Bacteria, UA FEW (*) RARE    CBC     Status: Abnormal   Collection Time   03/08/11  1:48 AM      Component Value Range  Comment   WBC 9.8  4.0 - 10.5 (K/uL)    RBC 5.89 (*) 3.87 - 5.11 (MIL/uL)    Hemoglobin 12.9  12.0 - 15.0 (g/dL)    HCT 44.0  10.2 - 72.5 (%)    MCV 66.0 (*) 78.0 - 100.0 (fL)    MCH 21.9 (*) 26.0 - 34.0 (pg)    MCHC 33.2  30.0 - 36.0 (g/dL)    RDW 36.6  11.5 - 15.5 (%)    Platelets 230  150 - 400 (K/uL)   COMPREHENSIVE METABOLIC PANEL     Status: Abnormal   Collection Time   03/08/11  1:48 AM      Component Value Range Comment   Sodium 134 (*) 135 - 145 (mEq/L)    Potassium 3.5  3.5 - 5.1 (mEq/L)    Chloride 96  96 - 112 (mEq/L)    CO2 25  19 - 32 (mEq/L)    Glucose, Bld 148 (*) 70 - 99 (mg/dL)    BUN 12  6 - 23 (mg/dL)    Creatinine, Ser 1.61  0.50 - 1.10 (mg/dL)    Calcium 9.8  8.4 - 10.5 (mg/dL)    Total Protein 6.9  6.0 - 8.3 (g/dL)    Albumin 4.2  3.5 - 5.2 (g/dL)    AST 20  0 - 37 (U/L)    ALT 21  0 - 35 (U/L)    Alkaline Phosphatase 83  39 - 117 (U/L)    Total Bilirubin 0.5  0.3 - 1.2 (mg/dL)    GFR calc non Af Amer 88 (*) >90 (mL/min)    GFR calc Af Amer >90  >90 (mL/min)   LIPASE, BLOOD     Status: Normal   Collection Time   03/08/11  1:48 AM      Component Value Range Comment   Lipase 40  11 - 59 (U/L)    Dg Abd Acute W/chest  03/08/2011  *RADIOLOGY REPORT*  Clinical Data: Abdominal pain.  History of small bowel obstruction 2 months ago.  Nausea and vomiting.  ACUTE ABDOMEN SERIES (ABDOMEN 2 VIEW & CHEST 1 VIEW)  Comparison: 02/20/2011  Findings: Normal heart size and pulmonary vascularity.  No focal airspace consolidation in the lungs.  No blunting of costophrenic angles.  No pneumothorax.  Surgical clips in the mid abdomen.  Normal bowel gas pattern with scattered gas and stool in the colon.  No small or large bowel distension.  No free intra-abdominal air.  No abnormal air fluid levels.  No radiopaque stones.  Mild degenerative changes in the spine and hips.  IMPRESSION: No evidence of active pulmonary disease.  Nonobstructive bowel gas pattern.  Original Report  Authenticated By: Marlon Pel, M.D.    Impression Present on Admission:  .SBO (small bowel obstruction) .Regional enteritis of ileum  65yoF with prior h/o cervical/endometrial ca 1997 s/p radial hysterectomy and abdominal  radiation, HTN, prior SBO in 12/2008 and recent admission 02/2011 for SBO with terminal  ileitis presents with nausea, vomiting, and abdominal pain.   1. Nausea, vomiting, abd pain: Likely acute on chronic obstruction, which is likely due to  remote abdominal surgeries and radiation. She had imaging just a couple weeks ago, and do  not see the benefit of reimaging her at this point. Her symptomatic episodes are  longstanding/chronic and she is followed well by Dr. Loreta Ave, her labs and vitals are  unimpressive, and frankly she looks very well, so I'll admit her observation status to  make sure she can tolerate PO's and give IVF's/pain control/antiemetics, and would have  her f/u with Dr. Loreta Ave on d/c. Her main concern is not getting dehydrated and making sure  things don't get worse, since she lives alone.   2. Terminal ileitis: As above, would defer to Dr. Loreta Ave for definitive management of this, as there are no signs of systemic toxicity from this.   3. No other active medical issues, continue  home meds.   DVT prophy: ambulation Observation, regular bed [facility] team 1 Presumed full code   Other plans as per orders.  Nickalaus Crooke 03/08/2011, 4:25 AM

## 2011-03-08 NOTE — ED Notes (Signed)
Pt presented to ER from home, pt states that s/s started about 1900 this evening, pt also reports that she was seen here about 2 weeks ago for the similar s/s, however, states the today it worsen. Pt states she had about 5 episodes of emesis, pt also developed diarrhea, and pain, chills but denies any fever. Pt also reports that she took Zofran and Pepto, no relief. Pt further states that she tried to rehydrate with Gatorade, but was not able to hold it down.

## 2011-03-08 NOTE — ED Provider Notes (Signed)
History     CSN: [id number]  Arrival date & time 03/08/11  0105   First MD Initiated Contact with Patient 03/08/11 0112      Chief Complaint  Patient presents with  . Abdominal Pain  . Chills  . Nausea  . Emesis    (Consider location/radiation/quality/duration/timing/severity/associated sxs/prior treatment) Patient is a 66 y.o. female presenting with abdominal pain and vomiting. The history is provided by the patient.  Abdominal Pain The primary symptoms of the illness include abdominal pain, nausea and vomiting. The primary symptoms of the illness do not include fever, shortness of breath or dysuria. The current episode started 3 to 5 hours ago. The onset of the illness was gradual. The problem has not changed since onset. The illness is associated with alcohol use. The patient has not had a change in bowel habit. Risk factors for an acute abdominal problem include a history of abdominal surgery. Additional symptoms associated with the illness include chills. Symptoms associated with the illness do not include diaphoresis or back pain.  Emesis  Associated symptoms include abdominal pain and chills. Pertinent negatives include no fever and no headaches.   location all of her abdomen, mostly upper abdomen. Pain described as dull ache. No radiation. Started around 7 PM last night gradual in onset and progressively worsening. Pain comes in waves and is associated with nausea and vomiting. Patient vomited a few hours ago Described as undigested food from about 24 hours ago. She was admitted here about 2 weeks ago for small bowel obstruction with the same presentation. She has remote history of hysterectomy and 2 years ago partial nephrectomy for tumor. She's also had radiation.  No fevers. Some loose stools but no diarrhea. No blood in stools. No recent travel. He did have Cipro Flagyl with last admission.  Past Medical History  Diagnosis Date  . Asthma   . Hypertension   . Thyroid disease    . Cervical cancer 1997    poorly differentiated cervical carcinoma.   . Endometrial cancer 1997    coincidental endometrial cancer also  . Hyperlipemia   . Angiomyolipoma of kidney   . Vertebral body hemangioma     T12  . Small bowel obstruction   . Complication of anesthesia     took 2 days to wake up after 1 surgery    Past Surgical History  Procedure Date  . Back surgery   . Tubal ligation   . Skin biopsy   . Abdominal hysterectomy Feb 1997    TAH/BSO, pelvic and periaortic lymphadenectomy   . Partial nephrectomy     right    History reviewed. No pertinent family history.  History  Substance Use Topics  . Smoking status: Never Smoker   . Smokeless tobacco: Never Used  . Alcohol Use: No    OB History    Grav Para Term Preterm Abortions TAB SAB Ect Mult Living                  Review of Systems  Constitutional: Positive for chills. Negative for fever and diaphoresis.  HENT: Negative for neck pain and neck stiffness.   Eyes: Negative for pain.  Respiratory: Negative for shortness of breath.   Cardiovascular: Negative for chest pain.  Gastrointestinal: Positive for nausea, vomiting and abdominal pain. Negative for anal bleeding and rectal pain.  Genitourinary: Negative for dysuria.  Musculoskeletal: Negative for back pain.  Skin: Negative for rash.  Neurological: Negative for headaches.  All other systems reviewed and  are negative.    Allergies  Codeine; Penicillins; and Prochlorperazine edisylate  Home Medications   Current Outpatient Rx  Name Route Sig Dispense Refill  . BENAZEPRIL-HYDROCHLOROTHIAZIDE 20-12.5 MG PO TABS Oral Take 1 tablet by mouth daily.    Marland Kitchen LEVOTHYROXINE SODIUM 75 MCG PO TABS Oral Take 75 mcg by mouth daily.    Marland Kitchen SIMVASTATIN 20 MG PO TABS Oral Take 20 mg by mouth every evening.      BP 143/93  Pulse 85  Temp(Src) 97.5 F (36.4 C) (Oral)  Resp 16  SpO2 98%  Physical Exam  Constitutional: She is oriented to person, place,  and time. She appears well-developed and well-nourished.  HENT:  Head: Normocephalic and atraumatic.  Eyes: Conjunctivae and EOM are normal. Pupils are equal, round, and reactive to light.  Neck: Trachea normal. Neck supple. No thyromegaly present.  Cardiovascular: Normal rate, regular rhythm, S1 normal, S2 normal and normal pulses.     No systolic murmur is present   No diastolic murmur is present  Pulses:      Radial pulses are 2+ on the right side, and 2+ on the left side.  Pulmonary/Chest: Effort normal and breath sounds normal. She has no wheezes. She has no rhonchi. She has no rales. She exhibits no tenderness.  Abdominal: Soft. Normal appearance. She exhibits no mass. There is no rebound, no guarding, no CVA tenderness and negative Murphy's sign.       Mild diffuse tenderness with no rebound or guarding. Hypoactive bowel sounds. No distention  Musculoskeletal:       BLE:s Calves nontender, no cords or erythema, negative Homans sign  Neurological: She is alert and oriented to person, place, and time. She has normal strength. No cranial nerve deficit or sensory deficit. GCS eye subscore is 4. GCS verbal subscore is 5. GCS motor subscore is 6.  Skin: Skin is warm and dry. No rash noted. She is not diaphoretic.  Psychiatric: Her speech is normal.       Cooperative and appropriate    ED Course  Procedures (including critical care time)  Labs Reviewed  CBC - Abnormal; Notable for the following:    RBC 5.89 (*)    MCV 66.0 (*)    MCH 21.9 (*)    All other components within normal limits  COMPREHENSIVE METABOLIC PANEL - Abnormal; Notable for the following:    Sodium 134 (*)    Glucose, Bld 148 (*)    GFR calc non Af Amer 88 (*)    All other components within normal limits  URINALYSIS, ROUTINE W REFLEX MICROSCOPIC - Abnormal; Notable for the following:    APPearance CLOUDY (*)    Leukocytes, UA TRACE (*)    All other components within normal limits  URINE MICROSCOPIC-ADD ON -  Abnormal; Notable for the following:    Bacteria, UA FEW (*)    All other components within normal limits  LIPASE, BLOOD   Dg Abd Acute W/chest  03/08/2011  *RADIOLOGY REPORT*  Clinical Data: Abdominal pain.  History of small bowel obstruction 2 months ago.  Nausea and vomiting.  ACUTE ABDOMEN SERIES (ABDOMEN 2 VIEW & CHEST 1 VIEW)  Comparison: 02/20/2011  Findings: Normal heart size and pulmonary vascularity.  No focal airspace consolidation in the lungs.  No blunting of costophrenic angles.  No pneumothorax.  Surgical clips in the mid abdomen.  Normal bowel gas pattern with scattered gas and stool in the colon.  No small or large bowel distension.  No free  intra-abdominal air.  No abnormal air fluid levels.  No radiopaque stones.  Mild degenerative changes in the spine and hips.  IMPRESSION: No evidence of active pulmonary disease.  Nonobstructive bowel gas pattern.  Original Report Authenticated By: Marlon Pel, M.D.     Case discussed as above with [place], who agrees to the evaluation and admission. Patient kept n.p.o. and provide IV fluids and Zofran and morphine as needed. She declines an NG tube.  MDM   Abdominal pain with nausea and vomiting and symptoms suggestive of bowel obstruction with recent history of same. She is vomiting undigested food that she ate greater than 24 hours ago and has diffuse abdominal tenderness. Labs and x-rays reviewed as above. Plan admit. We'll continue serial abdominal exams in the ED. Opted to hold the CT scan as patient  Had one 2 weeks ago with the same presentation. Admitting physician agrees to this plan. We'll repeat CT for any worsening condition       Sunnie Nielsen, MD 03/08/11 [id number]

## 2011-03-08 NOTE — Discharge Summary (Signed)
  Patient ID: [name] Richards MRN: [id number] DOB/AGE: 1945/06/08 66 y.o.  Admit date: 03/08/2011 Discharge date: 03/08/2011  Primary Care Physician:  [name] Bradford, MD, MD  Discharge Diagnoses:    Present on Admission:  .SBO (small bowel obstruction) .Regional enteritis of ileum .Small bowel obstruction  Principal Problem:  *Small bowel obstruction Active Problems:  Regional enteritis of ileum   Medication List  As of 03/08/2011  9:48 AM   TAKE these medications         benazepril-hydrochlorthiazide 20-12.5 MG per tablet   Commonly known as: LOTENSIN HCT   Take 1 tablet by mouth daily.      levothyroxine 75 MCG tablet   Commonly known as: SYNTHROID, LEVOTHROID   Take 75 mcg by mouth daily.      simvastatin 20 MG tablet   Commonly known as: ZOCOR   Take 20 mg by mouth every evening.            Disposition and Follow-up: with PCP in 1 week  Consults:  none   Brief H and P: 65yoF with prior h/o cervical/endometrial ca 1997 s/p radial hysterectomy and abdominal  radiation, HTN, prior SBO in 12/2008 and recent admission 02/2011 for SBO with terminal  ileitis presents with nausea, vomiting, and abdominal pain.    Physical Exam on Discharge:  Filed Vitals:   03/08/11 0112 03/08/11 0254 03/08/11 0416  BP: 143/93 127/80 137/82  Pulse: 85 62 59  Temp: 97.5 F (36.4 C)  98.6 F (37 C)  TempSrc: Oral  Oral  Resp: 16 16 20   Height:   5' 5.5" (1.664 m)  Weight:   67 kg (147 lb 11.3 oz)  SpO2: 98% 98% 97%    No intake or output data in the 24 hours ending 03/08/11 0948  General: Alert, awake, oriented x3, in no acute distress. HEENT: No bruits, no goiter. Heart: Regular rate and rhythm, without murmurs, rubs, gallops. Lungs: Clear to auscultation bilaterally. Abdomen: Soft, nontender, nondistended, positive bowel sounds. Extremities: No clubbing cyanosis or edema with positive pedal pulses. Neuro: Grossly intact, nonfocal.  CBC:    Component Value  Date/Time   WBC 9.8 03/08/2011 0148   HGB 12.9 03/08/2011 0148   HCT 38.9 03/08/2011 0148   PLT 230 03/08/2011 0148   MCV 66.0* 03/08/2011 0148   NEUTROABS 8.4* 02/19/2011 0650   LYMPHSABS 0.9 02/19/2011 0650   MONOABS 0.5 02/19/2011 0650   EOSABS 0.0 02/19/2011 0650   BASOSABS 0.0 02/19/2011 0650    Basic Metabolic Panel:    Component Value Date/Time   NA 134* 03/08/2011 0148   K 3.5 03/08/2011 0148   CL 96 03/08/2011 0148   CO2 25 03/08/2011 0148   BUN 12 03/08/2011 0148   CREATININE 0.70 03/08/2011 0148   GLUCOSE 148* 03/08/2011 0148   CALCIUM 9.8 03/08/2011 0148    Hospital Course:   Principal Problem:   *Small bowel obstruction - patient has a history of distal SBO and the signs and symptoms she has exhibited during this hospitalization are likely due to SBO. She is tolerating clear liquids and will advance to regular diet. - continue current home medications - patient is stable for discharge   Time spent on Discharge: Greater than 30 minutes   Signed: Janeva Richards 03/08/2011, 9:48 AM

## 2011-03-08 NOTE — ED Notes (Signed)
MD at bedside. 

## 2011-03-08 NOTE — Progress Notes (Signed)
Pt. Reports that she is having difficulty managing her diet at home.  She would like information from a nutritionist on soluble vs. Insoluble fibers and would like to talk to a specialist regarding diet advice and what foods will help prevent recurrent symptoms.  May patient please have a nutrition consult prior to being discharged.  Thanks, Nursing.

## 2011-03-24 ENCOUNTER — Encounter: Payer: Self-pay | Admitting: Gynecology

## 2011-03-24 ENCOUNTER — Ambulatory Visit (INDEPENDENT_AMBULATORY_CARE_PROVIDER_SITE_OTHER): Payer: Medicare Other | Admitting: Family Medicine

## 2011-03-24 ENCOUNTER — Encounter: Payer: Self-pay | Admitting: Family Medicine

## 2011-03-24 ENCOUNTER — Ambulatory Visit: Payer: Medicare Other | Attending: Gynecology | Admitting: Gynecology

## 2011-03-24 ENCOUNTER — Other Ambulatory Visit (HOSPITAL_COMMUNITY)
Admission: RE | Admit: 2011-03-24 | Discharge: 2011-03-24 | Disposition: A | Discharge status: Home / Self Care | Payer: Medicare Other | Source: Ambulatory Visit | Attending: Gynecology | Admitting: Gynecology

## 2011-03-24 VITALS — BP 130/64 | HR 68 | Temp 98.7°F | Resp 16 | Ht 65.5 in | Wt 147.8 lb

## 2011-03-24 DIAGNOSIS — Z124 Encounter for screening for malignant neoplasm of cervix: Secondary | ICD-10-CM | POA: Insufficient documentation

## 2011-03-24 DIAGNOSIS — Z9079 Acquired absence of other genital organ(s): Secondary | ICD-10-CM | POA: Insufficient documentation

## 2011-03-24 DIAGNOSIS — Z79899 Other long term (current) drug therapy: Secondary | ICD-10-CM | POA: Insufficient documentation

## 2011-03-24 DIAGNOSIS — E785 Hyperlipidemia, unspecified: Secondary | ICD-10-CM | POA: Insufficient documentation

## 2011-03-24 DIAGNOSIS — Z90710 Acquired absence of both cervix and uterus: Secondary | ICD-10-CM | POA: Insufficient documentation

## 2011-03-24 DIAGNOSIS — C539 Malignant neoplasm of cervix uteri, unspecified: Secondary | ICD-10-CM | POA: Insufficient documentation

## 2011-03-24 DIAGNOSIS — I10 Essential (primary) hypertension: Secondary | ICD-10-CM | POA: Insufficient documentation

## 2011-03-24 DIAGNOSIS — Z8719 Personal history of other diseases of the digestive system: Secondary | ICD-10-CM | POA: Insufficient documentation

## 2011-03-24 DIAGNOSIS — R109 Unspecified abdominal pain: Secondary | ICD-10-CM | POA: Insufficient documentation

## 2011-03-24 DIAGNOSIS — K56609 Unspecified intestinal obstruction, unspecified as to partial versus complete obstruction: Secondary | ICD-10-CM

## 2011-03-24 DIAGNOSIS — E079 Disorder of thyroid, unspecified: Secondary | ICD-10-CM | POA: Insufficient documentation

## 2011-03-24 NOTE — Progress Notes (Signed)
Medical Nutrition Therapy:  Appt start time: 1330 end time:  1415.  Assessment:  Primary concerns today: high soluble fiber diet as prescribed by her physician.  For the past 3 1/2 yrs [name] Richards has had numerous episodes of SBO, symptoms of which include N&V, abdominal pain.  CT scan in early January showed partial small bowel obstruction, wall thickening of ileum.  She estimates she was eating ~45 g fiber daily.  She cut down to 25 g of fiber, w/ 10-12 c water per day after hospitalization in January, but started having problems w/ constipation at this level of fiber intake.  Usual eating pattern includes 3 meals and 2 snacks per day. Everyday foods include for bkfst 1/4 c 7-grain cereal mixed w/ oats, flax seeds, almonds, dried apricot, dried cranberries, cinnamon, 1/2 tbsp peanut butter, ~1/2 c soymilk, 1 sausage patty, 2 c hot tea; usual lunch = 1 c diced cantalope, 1/3 c blueberries, 1/2 c cottage cheese, 1 slc rye bread (2 g fiber) or white Eng muffin (1 g fiber), maybe Fiber One brownie if fiber intake not too high already; 8-10 c water/day.   Avoided foods include artificial sweeteners (recent change).   24-hr recall suggests an intake of 26-28 g fiber: B (8 AM)- cooked breakfast mix w/ apricots, cranberries, 2/3 c soymilk, 2 c tea w/ 2 tsp total; L (1 PM)- 2 slc whwht bread (3 g fiber), 1 slc meunster chs, 1 slc ham, whole seed mustard, 4 slc homemade pickles, 2 clementines, 1 FiberOne brownie (5 g fiber; 90 kcal), water; workout (2 bottles water);  Snk (4 PM)- 1 oz Gouda, 5 Cruncher (rice) crackers; D (7 PM)- 3 oz baked cod, 7 brussels sprouts, 1/2 c sweet potato w/ spray fake butter & cinnamon sugar, water; Snk (~8 PM)- 2 c peppermint tea; Dove dark chocolate, 4 oz [place] yogurt w/ fruit blend & 1/2 tsp agave nectar, pinch of flax seeds, 1 tsp raw chopped cashews [or Activia].    Usual physical activity includes 5 X wk:  Alternates 55 min cardio (ellipt/TM) w/ 20-min w/up cardio and 75-90 min  resistance exercise.  In warm weather, walks 2 miles, ~28 min, 1-2 X wk.    Progress Towards Goal(s):  In progress.   Nutritional Diagnosis:  NI-5.8.3 Inappropriate intake of types of carbohydrates (specify):   As related to grains, fruits, veg's, and processed fiber food sources.  As evidenced by daily fiber intake of ~30 g in light of physician recommendations.    Intervention:  Nutrition education.  Monitoring/Evaluation:  Dietary intake, exercise, and body weight prn.

## 2011-03-24 NOTE — Progress Notes (Signed)
Consult Note: Gyn-Onc   [name] Richards 66 y.o. female  Chief Complaint  Patient presents with  . Cervical Cancer    Follow up    Interval History:  Returns as previously scheduled.  Over the past year, she has had several episodes of SBO which have been managed by conservative methods.  CT shows no evidence of cancer.  Bowel thickening suggesting radiation injury. She has no weight loss and overall functional status is good.   Gu function is normal and she has no pelvic symptoms.  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 She underwent a total abdominal hysterectomy and bilateral salpingo-oophorectomy, pelvic and para-aortic lymphadenectomy. She received postoperative whole pelvis radiation therapy.  Her other primary gynecologic problem has been lichen sclerosus managed with clobetasol.  Allergies  Allergen Reactions  . Codeine     Reporting amnesia  . Prochlorperazine Edisylate     Jaws locked  . Penicillins Hives    Past Medical History  Diagnosis Date  . Asthma   . Hypertension   . Thyroid disease   . Cervical cancer 1997    poorly differentiated cervical carcinoma. S/p TAH and radiation x6 wks   . Endometrial cancer 1997    coincidental endometrial cancer also  . Hyperlipemia   . 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 Vertebral body hemangioma     T12  . Small bowel obstruction     12/2008, repeat SBO in 02/2011 also with terminal ilietis   . Complication of anesthesia     took 2 days to wake up after 1 surgery    Past Surgical History  Procedure Date  . Back surgery   . Tubal ligation   . Skin biopsy   . Abdominal hysterectomy Feb 1997    TAH/BSO, pelvic and periaortic lymphadenectomy   . Partial nephrectomy     right    Current Outpatient Prescriptions  Medication Sig Dispense Refill  . benazepril-hydrochlorthiazide (LOTENSIN  HCT) 20-12.5 MG per tablet Take 1 tablet by mouth daily.      Marland Kitchen levothyroxine (SYNTHROID, LEVOTHROID) 75 MCG tablet Take 75 mcg by mouth daily.      . simvastatin (ZOCOR) 20 MG tablet Take 20 mg by mouth every evening.        History   Social History  . Marital Status: Divorced    Spouse Name: N/A    Number of Children: N/A  . Years of Education: N/A   Occupational History  . Not on file.   Social History Main Topics  . Smoking status: Never Smoker   . Smokeless tobacco: Never Used  . Alcohol Use: No  . Drug Use: No  . Sexually Active:    Other Topics Concern  . Not on file   Social History Narrative   Lives at home alone, works out at [employer] 6 days a week, still very active. Has an ex-husband who she is in contact with. Son died of an MI and another son in [place].     No family history on file.  Review of Systems: 10 point ROS is negative except as noted above.  Vitals: Blood pressure 130/64, pulse 68, temperature 98.7 F (37.1 C), resp. rate 16, height 5' 5.5" (1.664 m), weight 147 lb 12.8 oz (67.042 kg).  Physical Exam: In general the patient is a healthy white female in no acute distress.  HEENT  is negative  Neck is supple without thyromegaly. There is no supraclavicular, axillary, or inguinal adenopathy.  Breasts without masses nipple discharge, or skin changes.  The abdomen is soft and nontender no masses organomegaly or ascites are noted.  Pelvic exam EGBUS shows sequelae of lichen sclerosus. The vagina is slightly foreshortened.  Cervix and uterus are surgically absent.  Bimanual and rectovaginal exam revealed no masses induration or nodularity    Assessment/Plan: Poorly differentiated endometrial and cervical cancer 1997. No evidence of disease.  Recent episodes of intermittent small bowel obstruction most likely secondary to adhesions and radiation injury.  The patient is encouraged to try to manage this conservatively. We did discuss  surgical intervention and the risks involved.  Patient will see a nutritious later today for advice on a low fiber diet.  A Pap smears obtained area and patient return to see me in one year.  Jeannette Corpus, MD 03/24/2011, 9:06 AM                         Consult Note: Gyn-Onc   [name] Richards 66 y.o. female  Chief Complaint  Patient presents with  . Cervical Cancer    Follow up    Interval History:   HPI:  Allergies  Allergen Reactions  . Codeine     Reporting amnesia  . Prochlorperazine Edisylate     Jaws locked  . Penicillins Hives    Past Medical History  Diagnosis Date  . Asthma   . Hypertension   . Thyroid disease   . Cervical cancer 1997    poorly differentiated cervical carcinoma. S/p TAH and radiation x6 wks   . Endometrial cancer 1997    coincidental endometrial cancer also  . Hyperlipemia   . 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 Vertebral body hemangioma     T12  . Small bowel obstruction     12/2008, repeat SBO in 02/2011 also with terminal ilietis   . Complication of anesthesia     took 2 days to wake up after 1 surgery    Past Surgical History  Procedure Date  . Back surgery   . Tubal ligation   . Skin biopsy   . Abdominal hysterectomy Feb 1997    TAH/BSO, pelvic and periaortic lymphadenectomy   . Partial nephrectomy     right    Current Outpatient Prescriptions  Medication Sig Dispense Refill  . benazepril-hydrochlorthiazide (LOTENSIN HCT) 20-12.5 MG per tablet Take 1 tablet by mouth daily.      Marland Kitchen levothyroxine (SYNTHROID, LEVOTHROID) 75 MCG tablet Take 75 mcg by mouth daily.      . simvastatin (ZOCOR) 20 MG tablet Take 20 mg by mouth every evening.        History   Social History  . Marital Status: Divorced    Spouse Name: N/A    Number of Children: N/A  . Years of Education: N/A   Occupational History  . Not on file.   Social History Main Topics  . Smoking status:  Never Smoker   . Smokeless tobacco: Never Used  . Alcohol Use: No  . Drug Use: No  . Sexually Active:    Other Topics Concern  . Not on file   Social History Narrative   Lives at home alone, works out at [employer] 6 days a week, still very active. Has an ex-husband who she is in contact with. Son died of  an MI and another son in [place].     No family history on file.  Review of Systems:  Vitals: Blood pressure 130/64, pulse 68, temperature 98.7 F (37.1 C), resp. rate 16, height 5' 5.5" (1.664 m), weight 147 lb 12.8 oz (67.042 kg).  Physical Exam:  Assessment/Plan:   Jeannette Corpus, MD 03/24/2011, 9:06 AM

## 2011-03-24 NOTE — Patient Instructions (Addendum)
-   Water intake:  I recommend that you limit your fluid intake of tea and water to 10 cups per day.  If you dilute your food intake with water too much, you are also diluting your digestive enzymes, etc.   - Reminder:  ALL OF GOOD NUTRITION IS ABOUT MODERATION.   - Fiber intake:  Limit intake to 25-30 g per day for the next 2-4 weeks, but with at least some of that fiber in the form of pureed smoothie.  For example, pureed soup or smoothies.   - Foods to limit: Popcorn, most corn; for nuts & seeds, limit to 1 teaspoon at a time (chew well), and preferred is nut butter.  Also be careful about veg's and fruits with lots of seeds.  Steer clear of high-fiber breads. Beans and peas:  Ok only when pureed after cooking, and no more than 3 X wk.  Salads:  No more than 3 X wk.  Rice:  Minimize portion size.   - Supplements:  Magnesium in 1/2 the amount of calcium.  Calcium only once a day, but do try to meet your Ca needs with food (soy milk, yogurt, etc.).  Take your Ca supplement separate from a major Ca food source, but with food.  Less supplemental Ca may also help bowel function.  Remember that Mg and Ca and other minerals are less well absorbed when taken along with high-fiber foods.   - Email me with further Qs:  [email or web address].  Definitely let me know if you have another episode of obstruction or dehydration.    - Signs of dehydration:  Low BP, skin non-elastic, thirst, dry lips/mouth. (Google this to find more symptoms.) - Follow-up:  Call/email within a month to schedule.

## 2011-03-24 NOTE — Patient Instructions (Signed)
Return in one year.

## 2011-03-31 ENCOUNTER — Telehealth: Payer: Self-pay | Admitting: *Deleted

## 2011-03-31 NOTE — Telephone Encounter (Signed)
Patient informed of PAP results.  No questions or concerns voiced.

## 2011-05-05 ENCOUNTER — Emergency Department (HOSPITAL_COMMUNITY)
Admission: EM | Admit: 2011-05-05 | Discharge: 2011-05-05 | Disposition: A | Discharge status: Home / Self Care | Payer: Medicare Other | Attending: Emergency Medicine | Admitting: Emergency Medicine

## 2011-05-05 ENCOUNTER — Encounter (HOSPITAL_COMMUNITY): Payer: Self-pay | Admitting: *Deleted

## 2011-05-05 ENCOUNTER — Encounter: Payer: Self-pay | Admitting: Family Medicine

## 2011-05-05 ENCOUNTER — Ambulatory Visit (INDEPENDENT_AMBULATORY_CARE_PROVIDER_SITE_OTHER): Payer: Medicare Other | Admitting: Family Medicine

## 2011-05-05 ENCOUNTER — Emergency Department (HOSPITAL_COMMUNITY): Payer: Medicare Other

## 2011-05-05 VITALS — Ht 66.5 in

## 2011-05-05 DIAGNOSIS — I10 Essential (primary) hypertension: Secondary | ICD-10-CM | POA: Insufficient documentation

## 2011-05-05 DIAGNOSIS — Z8719 Personal history of other diseases of the digestive system: Secondary | ICD-10-CM

## 2011-05-05 DIAGNOSIS — R197 Diarrhea, unspecified: Secondary | ICD-10-CM | POA: Insufficient documentation

## 2011-05-05 DIAGNOSIS — R112 Nausea with vomiting, unspecified: Secondary | ICD-10-CM

## 2011-05-05 DIAGNOSIS — K56609 Unspecified intestinal obstruction, unspecified as to partial versus complete obstruction: Secondary | ICD-10-CM

## 2011-05-05 DIAGNOSIS — E785 Hyperlipidemia, unspecified: Secondary | ICD-10-CM | POA: Insufficient documentation

## 2011-05-05 DIAGNOSIS — R109 Unspecified abdominal pain: Secondary | ICD-10-CM | POA: Insufficient documentation

## 2011-05-05 DIAGNOSIS — J45909 Unspecified asthma, uncomplicated: Secondary | ICD-10-CM | POA: Insufficient documentation

## 2011-05-05 LAB — POCT I-STAT, CHEM 8
BUN: 20 mg/dL (ref 6–23)
Calcium, Ion: 1.14 mmol/L (ref 1.12–1.32)
Chloride: 96 mEq/L (ref 96–112)
Creatinine, Ser: 0.7 mg/dL (ref 0.50–1.10)
Glucose, Bld: 138 mg/dL — ABNORMAL HIGH (ref 70–99)
HCT: 41 % (ref 36.0–46.0)
Hemoglobin: 13.9 g/dL (ref 12.0–15.0)
Potassium: 3.4 mEq/L — ABNORMAL LOW (ref 3.5–5.1)
Sodium: 135 mEq/L (ref 135–145)
TCO2: 28 mmol/L (ref 0–100)

## 2011-05-05 LAB — CBC
HCT: 38.4 % (ref 36.0–46.0)
Hemoglobin: 12.7 g/dL (ref 12.0–15.0)
MCH: 22 pg — ABNORMAL LOW (ref 26.0–34.0)
MCHC: 33.1 g/dL (ref 30.0–36.0)
MCV: 66.7 fL — ABNORMAL LOW (ref 78.0–100.0)
Platelets: 268 10*3/uL (ref 150–400)
RBC: 5.76 MIL/uL — ABNORMAL HIGH (ref 3.87–5.11)
RDW: 14 % (ref 11.5–15.5)
WBC: 10.9 10*3/uL — ABNORMAL HIGH (ref 4.0–10.5)

## 2011-05-05 LAB — URINALYSIS, ROUTINE W REFLEX MICROSCOPIC
Bilirubin Urine: NEGATIVE
Glucose, UA: NEGATIVE mg/dL
Hgb urine dipstick: NEGATIVE
Ketones, ur: NEGATIVE mg/dL
Nitrite: NEGATIVE
Protein, ur: 30 mg/dL — AB
Specific Gravity, Urine: 1.019 (ref 1.005–1.030)
Urobilinogen, UA: 0.2 mg/dL (ref 0.0–1.0)
pH: 8.5 — ABNORMAL HIGH (ref 5.0–8.0)

## 2011-05-05 LAB — URINE MICROSCOPIC-ADD ON

## 2011-05-05 MED ORDER — BENZONATATE 100 MG PO CAPS: 100.0000 mg | ORAL_CAPSULE | Freq: Three times a day (TID) | ORAL | Status: DC

## 2011-05-05 MED ORDER — BENZONATATE 100 MG PO CAPS: 100.0000 mg | ORAL_CAPSULE | Freq: Three times a day (TID) | ORAL | Status: AC

## 2011-05-05 MED ORDER — KETOROLAC TROMETHAMINE 30 MG/ML IJ SOLN
30.0000 mg | Freq: Once | INTRAMUSCULAR | Status: AC
  Administered 2011-05-05: 30 mg via INTRAVENOUS
  Filled 2011-05-05: qty 1

## 2011-05-05 MED ORDER — BENZONATATE 100 MG PO CAPS
100.0000 mg | ORAL_CAPSULE | Freq: Once | ORAL | Status: AC
  Administered 2011-05-05: 100 mg via ORAL
  Filled 2011-05-05: qty 1

## 2011-05-05 MED ORDER — METOCLOPRAMIDE HCL 10 MG PO TABS: 10.0000 mg | ORAL_TABLET | Freq: Four times a day (QID) | ORAL | Status: AC

## 2011-05-05 MED ORDER — ONDANSETRON HCL 4 MG/2ML IJ SOLN
4.0000 mg | Freq: Once | INTRAMUSCULAR | Status: AC
  Administered 2011-05-05: 4 mg via INTRAVENOUS
  Filled 2011-05-05: qty 2

## 2011-05-05 NOTE — Progress Notes (Signed)
Medical Nutrition Therapy:  Appt start time: 1430 end time:  1530.  Assessment:  Primary concerns today: high soluble fiber diet as prescribed by her physician.  [name] Richards was in the ER this AM with nausea and vomiting.  She had started Delsym cough suppressant, prednisone, and a Z-pak 3 days ago.  She started feeling bad, had a rash on her face, and SBP was 179 last night, so she called the on-call physician, who told her to D/C the cough med and prednisone.  At ~9:30 last night she started having nausea, vomiting, loose stools, and abdominal pain, which progressed through the night.  She got home from the ER ~6 this AM, and slept ~4 hrs.  She feels better now, but is somewhat anxious about what to eat.  [name] Richards had had no problems at all since last appt, and she has been very careful with her diet, limiting fiber to 25 g/day, and avoiding foods such as most nuts, legumes, and leafy greens.   [name] Richards said foods don't taste good to her now, which has been increasing for about a year.  The foods [name] Richards reliably does like are eggs, cereal, yogurt, oatmeal, fruit, cottage cheese, and peanut butter.   The only food out of the ordinary yesterday was 10 cherry tomatoes, w/ seeds squeezed out.   24-hr recall:  B (8:30 AM)-  Oatmeal w/ soy sausage patty, 2 c tea Snk ( AM)-  none L (12:30 PM)-  Grilled chs, 1 c tomato soup, 1 orange Snk (1 PM)-  [employer] Fiber Bar (4 g sol, 1 g insol fiber) D (6:30 PM)-  1 slc toast, 3/4 c chx salad (w/ Grk yog & light mayo), 10 cherry tomatoes Snk ( PM)-   none No exercise recently b/c of bronchitis.    Progress Towards Goal(s):  In progress.   Nutritional Diagnosis:  NI-5.8.3 Inappropriate intake of types of carbohydrates (specify):   As related to grains, fruits, veg's, and processed fiber food sources.  As evidenced by daily fiber intake of ~30 g in light of physician recommendations.    Intervention:  Nutrition education.  Monitoring/Evaluation:  Dietary intake, exercise,  and body weight in 3 wks.

## 2011-05-05 NOTE — Patient Instructions (Addendum)
-   As you introduce new foods, be systematic and methodical about it, and record how much and when.  Start with very small amounts.  Choose one dietary change to make, and stick with that for at least 2 weeks before any additional changes.   - Foods to add back:   - Increase salads from 2 to 3 X wk.    - Beans (drained), starting with only 1/4 cup, no more than 3 X wk.     - Nuts (well chewed), 1 tablespoon for starters, no more than every other day.     - Blueberries: Try up to 1/4 cup, no more 3 X wk.   - Tomatoes: Large tomatoes rather than cherry:  Exclude seeds, peel at least partially, and have 1/2 tomato at a time.  (If using small ones, no more than 2 at a time.)      - Leafy greens:  Look for the most delicate, and have <1/2 cup to begin with, no more often than 3 X wk.   - Vegetables to add:  Roasted root veg's (in olive oil and garlic), including mushrooms, garlic, carrots, parsnips, onion, sweet potato, beets, turnips.   - Continue to limit liquids to in-between eating mostly and no more than 8-10 cups a day.   - Get back to exercise once you get Dr. Lucilla Lame approval.   - You might consider increasing your yogurt to twice a day for the next couple of weeks, following your antibiotic.

## 2011-05-05 NOTE — ED Notes (Signed)
Pt placed on prednisone and z pack for bronchitis started on this Friday,  States she believes she is allergic to prednisone this was her third day

## 2011-05-05 NOTE — ED Provider Notes (Signed)
History     CSN: [id number]  Arrival date & time 05/05/11  0127   First MD Initiated Contact with Patient 05/05/11 [phone]      Chief Complaint  Patient presents with  . Abdominal Pain    nausea and vomiting  since 2130 last night    (Consider location/radiation/quality/duration/timing/severity/associated sxs/prior treatment) HPI Comments: 66 year old female with history of hypertension, endometrial cancer, small bowel obstruction who presents with the complaint of abdominal pain nausea vomiting and loose stools. She states that she has seen a dietitian because of dietary problems causing similar symptoms in the past. Over the last 24 hours she has had progressive nausea vomiting and loose stools associated with abdominal cramping but no fevers, shortness of breath back pain rashes or swelling.  She has been on a Z-Pak over the last 3 days for her bronchitis which has improved significantly. Symptoms are persistent, moderate, gradually getting worse. She denies any sick contacts, travel and there is no blood in her stools  Patient is a 66 y.o. female presenting with abdominal pain. The history is provided by the patient and medical records.  Abdominal Pain The primary symptoms of the illness include abdominal pain.    Past Medical History  Diagnosis Date  . Asthma   . Hypertension   . Thyroid disease   . Cervical cancer 1997    poorly differentiated cervical carcinoma. S/p TAH and radiation x6 wks   . Endometrial cancer 1997    coincidental endometrial cancer also  . Hyperlipemia   . 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 Vertebral body hemangioma     T12  . Small bowel obstruction     12/2008, repeat SBO in 02/2011 also with terminal ilietis   . Complication of anesthesia     took 2 days to wake up after 1 surgery    Past Surgical History  Procedure Date  . Back surgery   . Tubal ligation   . Skin biopsy   . Abdominal hysterectomy Feb 1997   TAH/BSO, pelvic and periaortic lymphadenectomy   . Partial nephrectomy     right    History reviewed. No pertinent family history.  History  Substance Use Topics  . Smoking status: Never Smoker   . Smokeless tobacco: Never Used  . Alcohol Use: No    OB History    Grav Para Term Preterm Abortions TAB SAB Ect Mult Living                  Review of Systems  Gastrointestinal: Positive for abdominal pain.  All other systems reviewed and are negative.    Allergies  Codeine; Prochlorperazine edisylate; and Penicillins  Home Medications   Current Outpatient Rx  Name Route Sig Dispense Refill  . AZITHROMYCIN 250 MG PO TABS Oral Take 250 mg by mouth daily.    Marland Kitchen BENAZEPRIL-HYDROCHLOROTHIAZIDE 20-12.5 MG PO TABS Oral Take 1 tablet by mouth daily.    Marland Kitchen CALCIUM CARBONATE-VITAMIN D 500-200 MG-UNIT PO TABS Oral Take 1 tablet by mouth daily.    Marland Kitchen VITAMIN D 1000 UNITS PO TABS Oral Take 1,000 Units by mouth 1 day or 1 dose.    Marland Kitchen OMEGA-3 FATTY ACIDS 1000 MG PO CAPS Oral Take 1,000 g by mouth 2 times daily at 12 noon and 4 pm.    . LEVOTHYROXINE SODIUM 75 MCG PO TABS Oral Take 75 mcg by mouth daily.    Marland Kitchen MAGNESIUM GLUCONATE 500 MG PO TABS  Oral Take 500 mg by mouth 2 (two) times daily.    . MULTI-VITAMIN/MINERALS PO TABS Oral Take 1 tablet by mouth daily.    Marland Kitchen SIMVASTATIN 20 MG PO TABS Oral Take 20 mg by mouth every evening.    Marland Kitchen VITAMIN C 500 MG PO TABS Oral Take 500 mg by mouth daily.    Marland Kitchen METOCLOPRAMIDE HCL 10 MG PO TABS Oral Take 1 tablet (10 mg total) by mouth every 6 (six) hours. 30 tablet 0    BP 136/66  Pulse 60  Temp(Src) 98.7 F (37.1 C) (Oral)  Resp 18  Wt 152 lb (68.947 kg)  SpO2 97%  Physical Exam  Nursing note and vitals reviewed. Constitutional: She appears well-developed and well-nourished. No distress.  HENT:  Head: Normocephalic and atraumatic.  Mouth/Throat: No oropharyngeal exudate.       Mucous membranes mildly dehydrated  Eyes: Conjunctivae and EOM are  normal. Pupils are equal, round, and reactive to light. Right eye exhibits no discharge. Left eye exhibits no discharge. No scleral icterus.  Neck: Normal range of motion. Neck supple. No JVD present. No thyromegaly present.  Cardiovascular: Normal rate, regular rhythm, normal heart sounds and intact distal pulses.  Exam reveals no gallop and no friction rub.   No murmur heard. Pulmonary/Chest: Effort normal and breath sounds normal. No respiratory distress. She has no wheezes. She has no rales.  Abdominal: Soft. Bowel sounds are normal. She exhibits no distension and no mass. There is tenderness ( . Umbilical and suprapubic mild tenderness, non-peritoneal, no guarding, no masses, no pain at McBurney's point or right upper quadrant). There is no rebound and no guarding.  Musculoskeletal: Normal range of motion. She exhibits no edema and no tenderness.  Lymphadenopathy:    She has no cervical adenopathy.  Neurological: She is alert. Coordination normal.  Skin: Skin is warm and dry. No rash noted. No erythema.  Psychiatric: She has a normal mood and affect. Her behavior is normal.    ED Course  Procedures (including critical care time)  Labs Reviewed  URINALYSIS, ROUTINE W REFLEX MICROSCOPIC - Abnormal; Notable for the following:    APPearance CLOUDY (*)    pH 8.5 (*)    Protein, ur 30 (*)    Leukocytes, UA SMALL (*)    All other components within normal limits  CBC - Abnormal; Notable for the following:    WBC 10.9 (*)    RBC 5.76 (*)    MCV 66.7 (*)    MCH 22.0 (*)    All other components within normal limits  POCT I-STAT, CHEM 8 - Abnormal; Notable for the following:    Potassium 3.4 (*)    Glucose, Bld 138 (*)    All other components within normal limits  URINE MICROSCOPIC-ADD ON   Dg Abd Acute W/chest  05/05/2011  *RADIOLOGY REPORT*  Clinical Data: Upper abdominal pain and nausea.  ACUTE ABDOMEN SERIES (ABDOMEN 2 VIEW & CHEST 1 VIEW)  Comparison: Chest and abdominal radiograph  performed 03/08/2011  Findings: The lungs are well-aerated and clear.  There is no evidence of focal opacification, pleural effusion or pneumothorax. The cardiomediastinal silhouette is within normal limits.  The visualized bowel gas pattern is unremarkable.  Scattered stool and air are seen within the colon; there is no evidence of small bowel dilatation to suggest obstruction.  No free intra-abdominal air is identified on the provided upright view.  No acute osseous abnormalities are seen; the sacroiliac joints are unremarkable in appearance.  Diffuse  clips are noted within the lower abdomen and pelvis.  IMPRESSION:  1.  Unremarkable bowel gas pattern; no free intra-abdominal air seen. 2.  No acute cardiopulmonary process identified.  Original Report Authenticated By: Tonia Ghent, M.D.     1. Nausea vomiting and diarrhea       MDM  Gastrointestinal illness with normal vital signs other than slightly elevated blood pressure. Will attempt rehydration, antiemetics for nausea, check urinalysis, pain medication, reevaluate. Exam not consistent with small bowel obstruction with no distention, tympanitic sounds and has been having loose stools all day.  Testing results reviewed showing no leukocytosis, no anemia, normal platelets, urinalysis which has no ketones and no infection. Acute abdominal series shows no obstruction, patient has significant improvement after IV fluids and IV antiemetics and pain medications, declines pain medication for home but will treat with Reglan.  Discharge Prescriptions include:  Reglan  Vida Roller, MD 05/05/11 [phone]

## 2011-05-26 ENCOUNTER — Ambulatory Visit (INDEPENDENT_AMBULATORY_CARE_PROVIDER_SITE_OTHER): Payer: Medicare Other | Admitting: Family Medicine

## 2011-05-26 ENCOUNTER — Encounter: Payer: Self-pay | Admitting: Family Medicine

## 2011-05-26 VITALS — Ht 65.5 in | Wt 153.0 lb

## 2011-05-26 DIAGNOSIS — K56609 Unspecified intestinal obstruction, unspecified as to partial versus complete obstruction: Secondary | ICD-10-CM

## 2011-05-26 DIAGNOSIS — Z8719 Personal history of other diseases of the digestive system: Secondary | ICD-10-CM

## 2011-05-26 NOTE — Progress Notes (Signed)
Medical Nutrition Therapy:  Appt start time: 1000 end time:  1100.  Assessment:  Primary concerns today: high soluble fiber diet as prescribed by her physician.  Darel Hong has been carefully following dietary recommendations, and has been mostly symptom-free, but did have severe stomach pains and diarrhea following a meal from [employer], which was higher in fat than what she is used to.  She has added back greens 3 X wk, eggplant, mushrooms, and bok choy.  She is eating 1 1/2 to 2 tsp of nuts and seeds daily, also with no symptoms.  Having cut down on her fiber intake, Darel Hong is now sometimes experiencing constipation.  Darel Hong is back to consistent exercise, having recovered from bronchitis.    Progress Towards Goal(s):  In progress.   Nutritional Diagnosis:  Progress on NI-5.8.3 Inappropriate intake of types of carbohydrates (specify):   As related to grains, fruits, veg's, and processed fiber food sources.  As evidenced by daily fiber intake of ~30 g in light of physician recommendations.    Intervention:  Nutrition education.  Monitoring/Evaluation:  Dietary intake, exercise, and body weight in 2 wks.

## 2011-05-26 NOTE — Patient Instructions (Addendum)
-   Beans other than baked beans (drained), starting with only 1/4 cup, no more than 3 X wk.    - If no symptoms, then increase serving size to 1/2 cup 3 X wk, with at least one day in-between.   - Continue to include salad 3 X wk.   - Yogurt: - Try some of the frozen blueberries & [employer] with your Greek yogurt.    - Mix Activia with plain yogurt.   - As you add back veg's, add some to lunch.   - Aim for at least 8 cups of liquids per day (no more than 10).

## 2011-06-09 ENCOUNTER — Ambulatory Visit: Payer: Medicare Other | Admitting: Family Medicine

## 2011-06-10 ENCOUNTER — Encounter: Payer: Self-pay | Admitting: Family Medicine

## 2011-06-10 ENCOUNTER — Ambulatory Visit (INDEPENDENT_AMBULATORY_CARE_PROVIDER_SITE_OTHER): Payer: Medicare Other | Admitting: Family Medicine

## 2011-06-10 DIAGNOSIS — Z8719 Personal history of other diseases of the digestive system: Secondary | ICD-10-CM

## 2011-06-10 DIAGNOSIS — R109 Unspecified abdominal pain: Secondary | ICD-10-CM

## 2011-06-10 NOTE — Patient Instructions (Addendum)
-   Continue to monitor and keep constant your fiber intake and sources.   - Reminder:  Other contributors to good bowel function:  - Adequate sleep.  - Consistent exercise.  - If you want to add nuts to your diet, be sure to use small amounts and chew well.  - Switching to a whole-wheat bread (no seeds) will not be a huge change if you're not eating a lot of bread.    - I recommend no other major changes before or during your cruise.   - After your cruise, if you'd like to get together again, you might be able to be seen in Nutrition Clinic at [facility].  (Call Jeannie for appt.)  Bring a list of foods you'd like to add back when you come next.

## 2011-06-10 NOTE — Progress Notes (Signed)
Medical Nutrition Therapy:  Appt start time: 1330 end time:  1530.  Assessment:  Primary concerns today: high soluble fiber diet as prescribed by her physician.  [name] Richards is now eating up to 1/2 c of beans 3 X wk.  She is still eating salad 3 X wk, and sometime those are large salads.  She's been having cooked 1/2 c greens 3 X wk as well, and has still been limiting fluids to 10 cups a day.  She estimates she has been getting ~25 g fiber a day.  Bowel function has returned to normal, and she feels well.  [name] Richards will be taking a week-long [place] cruise in May 31.  There are more foods she would like to add back to her diet, but I advised her to not make any major changes before the cruise, since things seem to be going smoothly now.    Progress Towards Goal(s):  In progress.   Nutritional Diagnosis:  Progress on NI-5.8.3 Inappropriate intake of types of carbohydrates (specify):   As related to grains, fruits, veg's, and processed fiber food sources.  As evidenced by daily fiber intake of ~30 g in light of physician recommendations.    Intervention:  Nutrition education.  Monitoring/Evaluation:  Dietary intake, exercise, and body weight in 2 wks.

## 2011-11-06 ENCOUNTER — Ambulatory Visit (INDEPENDENT_AMBULATORY_CARE_PROVIDER_SITE_OTHER): Payer: Medicare Other | Admitting: Radiology

## 2011-11-06 DIAGNOSIS — Z23 Encounter for immunization: Secondary | ICD-10-CM

## 2012-04-09 ENCOUNTER — Ambulatory Visit: Payer: Medicare Other | Admitting: Gynecology

## 2012-04-12 ENCOUNTER — Ambulatory Visit: Payer: Medicare Other | Admitting: Gynecology

## 2012-04-19 ENCOUNTER — Encounter (HOSPITAL_COMMUNITY): Payer: Self-pay | Admitting: Emergency Medicine

## 2012-04-19 ENCOUNTER — Emergency Department (HOSPITAL_COMMUNITY)
Admission: EM | Admit: 2012-04-19 | Discharge: 2012-04-19 | Disposition: A | Discharge status: Home / Self Care | Payer: Medicare Other | Attending: Emergency Medicine | Admitting: Emergency Medicine

## 2012-04-19 DIAGNOSIS — I10 Essential (primary) hypertension: Secondary | ICD-10-CM | POA: Insufficient documentation

## 2012-04-19 DIAGNOSIS — Z923 Personal history of irradiation: Secondary | ICD-10-CM | POA: Insufficient documentation

## 2012-04-19 DIAGNOSIS — Z79899 Other long term (current) drug therapy: Secondary | ICD-10-CM | POA: Insufficient documentation

## 2012-04-19 DIAGNOSIS — E785 Hyperlipidemia, unspecified: Secondary | ICD-10-CM | POA: Insufficient documentation

## 2012-04-19 DIAGNOSIS — Z90710 Acquired absence of both cervix and uterus: Secondary | ICD-10-CM | POA: Insufficient documentation

## 2012-04-19 DIAGNOSIS — R109 Unspecified abdominal pain: Secondary | ICD-10-CM

## 2012-04-19 DIAGNOSIS — Z8541 Personal history of malignant neoplasm of cervix uteri: Secondary | ICD-10-CM | POA: Insufficient documentation

## 2012-04-19 DIAGNOSIS — Z87448 Personal history of other diseases of urinary system: Secondary | ICD-10-CM | POA: Insufficient documentation

## 2012-04-19 DIAGNOSIS — R112 Nausea with vomiting, unspecified: Secondary | ICD-10-CM

## 2012-04-19 DIAGNOSIS — R197 Diarrhea, unspecified: Secondary | ICD-10-CM

## 2012-04-19 DIAGNOSIS — Z85850 Personal history of malignant neoplasm of thyroid: Secondary | ICD-10-CM | POA: Insufficient documentation

## 2012-04-19 DIAGNOSIS — J45909 Unspecified asthma, uncomplicated: Secondary | ICD-10-CM | POA: Insufficient documentation

## 2012-04-19 DIAGNOSIS — Z8719 Personal history of other diseases of the digestive system: Secondary | ICD-10-CM | POA: Insufficient documentation

## 2012-04-19 LAB — CBC WITH DIFFERENTIAL/PLATELET
Basophils Absolute: 0 10*3/uL (ref 0.0–0.1)
Basophils Relative: 0 % (ref 0–1)
Eosinophils Absolute: 0 10*3/uL (ref 0.0–0.7)
Eosinophils Relative: 0 % (ref 0–5)
HCT: 39.7 % (ref 36.0–46.0)
Hemoglobin: 13.2 g/dL (ref 12.0–15.0)
Lymphocytes Relative: 7 % — ABNORMAL LOW (ref 12–46)
Lymphs Abs: 0.7 10*3/uL (ref 0.7–4.0)
MCH: 22.2 pg — ABNORMAL LOW (ref 26.0–34.0)
MCHC: 33.2 g/dL (ref 30.0–36.0)
MCV: 66.8 fL — ABNORMAL LOW (ref 78.0–100.0)
Monocytes Absolute: 0.3 10*3/uL (ref 0.1–1.0)
Monocytes Relative: 3 % (ref 3–12)
Neutro Abs: 8.1 10*3/uL — ABNORMAL HIGH (ref 1.7–7.7)
Neutrophils Relative %: 89 % — ABNORMAL HIGH (ref 43–77)
Platelets: 226 10*3/uL (ref 150–400)
RBC: 5.94 MIL/uL — ABNORMAL HIGH (ref 3.87–5.11)
RDW: 13.8 % (ref 11.5–15.5)
WBC: 9.1 10*3/uL (ref 4.0–10.5)

## 2012-04-19 LAB — URINALYSIS, MICROSCOPIC ONLY
Bilirubin Urine: NEGATIVE
Glucose, UA: NEGATIVE mg/dL
Hgb urine dipstick: NEGATIVE
Ketones, ur: NEGATIVE mg/dL
Nitrite: NEGATIVE
Protein, ur: NEGATIVE mg/dL
Specific Gravity, Urine: 1.013 (ref 1.005–1.030)
Urobilinogen, UA: 0.2 mg/dL (ref 0.0–1.0)
pH: 7.5 (ref 5.0–8.0)

## 2012-04-19 LAB — COMPREHENSIVE METABOLIC PANEL
ALT: 19 U/L (ref 0–35)
AST: 20 U/L (ref 0–37)
Albumin: 3.7 g/dL (ref 3.5–5.2)
Alkaline Phosphatase: 75 U/L (ref 39–117)
BUN: 17 mg/dL (ref 6–23)
CO2: 27 mEq/L (ref 19–32)
Calcium: 9.2 mg/dL (ref 8.4–10.5)
Chloride: 98 mEq/L (ref 96–112)
Creatinine, Ser: 0.61 mg/dL (ref 0.50–1.10)
GFR calc Af Amer: >90 mL/min (ref >90)
GFR calc non Af Amer: >90 mL/min (ref >90)
Glucose, Bld: 142 mg/dL — ABNORMAL HIGH (ref 70–99)
Potassium: 3.2 mEq/L — ABNORMAL LOW (ref 3.5–5.1)
Sodium: 139 mEq/L (ref 135–145)
Total Bilirubin: 0.6 mg/dL (ref 0.3–1.2)
Total Protein: 6.7 g/dL (ref 6.0–8.3)

## 2012-04-19 LAB — LIPASE, BLOOD: Lipase: 47 U/L (ref 11–59)

## 2012-04-19 MED ORDER — FENTANYL CITRATE 0.05 MG/ML IJ SOLN
25.0000 ug | Freq: Once | INTRAMUSCULAR | Status: AC
  Administered 2012-04-19: 25 ug via INTRAVENOUS
  Filled 2012-04-19: qty 2

## 2012-04-19 MED ORDER — ONDANSETRON 4 MG PO TBDP
4.0000 mg | ORAL_TABLET | Freq: Once | ORAL | Status: AC
  Administered 2012-04-19: 4 mg via ORAL
  Filled 2012-04-19: qty 1

## 2012-04-19 MED ORDER — LACTATED RINGERS IV BOLUS (SEPSIS)
1000.0000 mL | Freq: Once | INTRAVENOUS | Status: AC
  Administered 2012-04-19: 1000 mL via INTRAVENOUS

## 2012-04-19 MED ORDER — ONDANSETRON HCL 4 MG/2ML IJ SOLN
4.0000 mg | Freq: Once | INTRAMUSCULAR | Status: AC
  Administered 2012-04-19: 4 mg via INTRAVENOUS
  Filled 2012-04-19: qty 2

## 2012-04-19 MED ORDER — HYDROCODONE-ACETAMINOPHEN 5-325 MG PO TABS: 1.0000 | ORAL_TABLET | Freq: Four times a day (QID) | ORAL | Status: DC | PRN

## 2012-04-19 MED ORDER — ONDANSETRON 4 MG PO TBDP: 4.0000 mg | ORAL_TABLET | Freq: Three times a day (TID) | ORAL | Status: DC | PRN

## 2012-04-19 NOTE — ED Notes (Signed)
Pt informed of need for UA sample. Stated she was not able top go at this time. Will recheck after fluid bolus is complete.

## 2012-04-19 NOTE — ED Notes (Signed)
Pt given all discharge instructions and prescriptions; pt stated understanding and denied questions/concerns; pt stable at discharge

## 2012-04-19 NOTE — ED Notes (Signed)
Pt states she is here this morning for nausea, vomiting, and diarrhea  Pt states it started about 9pm last night  Pt states she has dizziness with movement  Pt states she is vomiting just bile now  Pt also states she is having abd pain with this

## 2012-04-19 NOTE — ED Provider Notes (Signed)
History     CSN: [id number]  Arrival date & time 04/19/12  4098   First MD Initiated Contact with Patient 04/19/12 0541      Chief Complaint  Patient presents with  . Emesis  . Diarrhea    (Consider location/radiation/quality/duration/timing/severity/associated sxs/prior treatment) HPI [name] Richards is a 67 y.o. female with a past medical history significant for endometrial cancer and cervical cancer she is status post total hysterectomy and status post radiation therapy. 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  Patient says she is presenting with similar type symptoms, she's had nausea and vomiting plus episodes of diarrhea since 2100 last night. She says she's had some dizziness especially on changing position or standing, she also admits some abdominal pain along the anterior aspect of her ribs, upper abdomen. She says she thinks this pain is from vomiting frequently. Pain is mild to moderate at this point. She says it does not hurt unless palpated or less she vomits.  Patient was given Zofran she says her nausea has resolved since receiving the medication. She says this feels like prior episodes.  She denies any fevers, chills, no chest pain or shortness of breath.   Past Medical History  Diagnosis Date  . Asthma   . Hypertension   . Thyroid disease   . Cervical cancer 1997    poorly differentiated cervical carcinoma. S/p TAH and radiation x6 wks   . Endometrial cancer 1997    coincidental endometrial cancer also  . Hyperlipemia   . 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 Vertebral body hemangioma     T12  . Small bowel obstruction     12/2008, repeat SBO in 02/2011 also with terminal ilietis   . Complication of anesthesia     took 2 days to wake up after 1 surgery    Past Surgical History  Procedure Laterality Date  . Back  surgery    . Tubal ligation    . Skin biopsy    . Abdominal hysterectomy  Feb 1997    TAH/BSO, pelvic and periaortic lymphadenectomy   . Partial nephrectomy      right    Family History  Problem Relation Age of Onset  . Hypertension Other   . Diabetes Other   . Stroke Other   . CAD Other     History  Substance Use Topics  . Smoking status: Never Smoker   . Smokeless tobacco: Never Used  . Alcohol Use: No    OB History   Grav Para Term Preterm Abortions TAB SAB Ect Mult Living                  Review of Systems At least 10pt or greater review of systems completed and are negative except where specified in the HPI.  Allergies  Codeine; Prochlorperazine edisylate; and Penicillins  Home Medications   Current Outpatient Rx  Name  Route  Sig  Dispense  Refill  . acidophilus (RISAQUAD) CAPS   Oral   Take 1 capsule by mouth daily.         . benazepril-hydrochlorthiazide (LOTENSIN HCT) 20-12.5 MG per tablet   Oral   Take 1 tablet by mouth daily.         Marland Kitchen bismuth subsalicylate (PEPTO BISMOL) 262 MG chewable tablet   Oral   Chew 524  mg by mouth as needed for indigestion. Nausea         . calcium-vitamin D (OSCAL WITH D) 500-200 MG-UNIT per tablet   Oral   Take 1 tablet by mouth daily.         . cholecalciferol (VITAMIN D) 1000 UNITS tablet   Oral   Take 1,000 Units by mouth 2 (two) times daily.          Marland Kitchen levothyroxine (SYNTHROID, LEVOTHROID) 75 MCG tablet   Oral   Take 75 mcg by mouth daily.         . magnesium gluconate (MAGONATE) 500 MG tablet   Oral   Take 500 mg by mouth every evening.          . Multiple Vitamins-Minerals (MULTIVITAMIN WITH MINERALS) tablet   Oral   Take 1 tablet by mouth daily.         Marland Kitchen omega-3 acid ethyl esters (LOVAZA) 1 G capsule   Oral   Take 1 g by mouth 2 (two) times daily.         . simvastatin (ZOCOR) 20 MG tablet   Oral   Take 20 mg by mouth every evening.         . vitamin C (ASCORBIC ACID)  500 MG tablet   Oral   Take 500 mg by mouth daily.         Marland Kitchen HYDROcodone-acetaminophen (NORCO/VICODIN) 5-325 MG per tablet   Oral   Take 1-2 tablets by mouth every 6 (six) hours as needed for pain.   17 tablet   0   . ondansetron (ZOFRAN ODT) 4 MG disintegrating tablet   Oral   Take 1 tablet (4 mg total) by mouth every 8 (eight) hours as needed for nausea.   15 tablet   0     May take 2 tablets total if nausea persists after  ...     BP 149/92  Pulse 86  Temp(Src) 97.4 F (36.3 C) (Oral)  Resp 16  SpO2 97%  Physical Exam  Nursing notes reviewed.  Electronic medical record reviewed. VITAL SIGNS:   Filed Vitals:   04/19/12 0431  BP: 149/92  Pulse: 86  Temp: 97.4 F (36.3 C)  TempSrc: Oral  Resp: 16  SpO2: 97%   CONSTITUTIONAL: Awake, oriented, appears non-toxic HENT: Atraumatic, normocephalic, oral mucosa pink and moist, airway patent. Nares patent without drainage. External ears normal. EYES: Conjunctiva clear, EOMI, PERRLA NECK: Trachea midline, non-tender, supple CARDIOVASCULAR: Normal heart rate, Normal rhythm, No murmurs, rubs, gallops PULMONARY/CHEST: Clear to auscultation, no rhonchi, wheezes, or rales. Symmetrical breath sounds. Non-tender. ABDOMINAL: Non-distended, soft, non-tender - no rebound or guarding along the actual abdomen. Patient has some minimal tenderness along the muscular insertion of the rectus muscles and the anterior costal angle.  BS increased. NEUROLOGIC: Non-focal, moving all four extremities, no gross sensory or motor deficits. Back: Nontender to palpation well-healed thoracic surgical scar (removal of spinal meningioma.) EXTREMITIES: No clubbing, cyanosis, or edema SKIN: Warm, Dry, No erythema, No rash  ED Course  Procedures (including critical care time)  Labs Reviewed  CBC WITH DIFFERENTIAL - Abnormal; Notable for the following:    RBC 5.94 (*)    MCV 66.8 (*)    MCH 22.2 (*)    Neutrophils Relative 89 (*)    Neutro Abs  8.1 (*)    Lymphocytes Relative 7 (*)    All other components within normal limits  COMPREHENSIVE METABOLIC PANEL - Abnormal; Notable for the following:  Potassium 3.2 (*)    Glucose, Bld 142 (*)    All other components within normal limits  URINALYSIS, MICROSCOPIC ONLY - Abnormal; Notable for the following:    Leukocytes, UA TRACE (*)    All other components within normal limits  LIPASE, BLOOD   No results found.   1. Nausea and vomiting in adult   2. Diarrhea   3. Abdominal  pain, other specified site       MDM  [name] Richards is a 67 y.o. 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 Patient says she thinks she is mildly dehydrated, she says this occurs frequently when she has these episodes.  He should abdomen is benign and my exam, she is nontoxic and afebrile, first set of vital signs are stable and within normal limits.  Basic labs obtained, patient treated with 2 L fluid bolus as well as Zofran and a small dose of pain medicine because she does not want anything "too strong.  Laboratory results are fairly unremarkable, patient is mildly decreased potassium of 3.2, do not think this will require supplementation at this time. Patient's urinalysis is unremarkable, CMP is otherwise unremarkable, CBC shows some mild chronic anemia.  Patient is feeling much better after therapy, labs are unremarkable, I do not think the patient has an obstruction at this time, her abdomen continues to be soft, nondistended, nontender to palpation. She would like to go home, she understands that a clear liquid diet. Will send the patient home with some pain medicine as well as Zofran. Patient is to return to the emergency department for any worsening symptoms including return of nausea and vomiting that does not respond to medication, inability to maintain fluid hydration,  blood in the stools or any other concerning symptoms.          Jones Skene, MD 04/19/12 1610

## 2012-05-10 ENCOUNTER — Ambulatory Visit: Payer: Medicare Other | Attending: Gynecology | Admitting: Gynecology

## 2012-05-10 ENCOUNTER — Encounter: Payer: Self-pay | Admitting: Gynecology

## 2012-05-10 VITALS — BP 118/62 | HR 70 | Temp 99.1°F | Resp 16 | Ht 65.0 in | Wt 157.6 lb

## 2012-05-10 DIAGNOSIS — C539 Malignant neoplasm of cervix uteri, unspecified: Secondary | ICD-10-CM | POA: Insufficient documentation

## 2012-05-10 DIAGNOSIS — Z90710 Acquired absence of both cervix and uterus: Secondary | ICD-10-CM | POA: Insufficient documentation

## 2012-05-10 DIAGNOSIS — L940 Localized scleroderma [morphea]: Secondary | ICD-10-CM | POA: Insufficient documentation

## 2012-05-10 NOTE — Progress Notes (Signed)
Consult Note: Gyn-Onc   [name] Richards 67 y.o. female  Chief Complaint  Patient presents with  . Cervical Cancer    Follow up    Assessment : Poorly differentiated endometrial and cervical cancer 1997. No evidence of disease.  Lichen sclerosis of the vulva (stable)  Plan: We will discontinue doing Pap smears. Patient return to see [place] in one year. She'll continue to have annual mammograms.  Interval History: Patient returns today as previously scheduled for annual exam. Since her last visit she's had no further episodes of small bowel obstruction. Overall she's doing well. She reports of lichen sclerosis is asymptomatic. She have mammograms recently that were normal. She has no GI or GU symptoms. Functional status is excellent. She works out 5 days a week.  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 She underwent a total abdominal hysterectomy and bilateral salpingo-oophorectomy, pelvic and para-aortic lymphadenectomy. She received postoperative whole pelvis radiation therapy.  Her other primary gynecologic problem has been lichen sclerosus managed with when necessary clobetasol.   Review of Systems:10 point review of systems is negative except as noted in interval history.   Vitals: Blood pressure 118/62, pulse 70, temperature 99.1 F (37.3 C), temperature source Oral, resp. rate 16, height 5\' 5"  (1.651 m), weight 157 lb 9.6 oz (71.487 kg).  Physical Exam: General : The patient is a healthy woman in no acute distress.  HEENT: normocephalic, extraoccular movements normal; neck is supple without thyromegally  Lynphnodes: Supraclavicular and inguinal nodes not enlarged  Breasts: Without masses, skin changes or nipple discharge. There is no axillary adenopathy.  Abdomen: Soft, non-tender, no ascites, no organomegally, no masses, no hernias  Pelvic:  EGBUS: Normal female mild lichen  sclerosis.  Vagina: Normal, no lesions , atrophic Urethra and Bladder: Normal, non-tender  Cervix: Surgically absent  Uterus: Surgically absent  Bi-manual examination: Non-tender; no adenxal masses or nodularity  Rectal: normal sphincter tone, no masses, no blood  Lower extremities: No edema or varicosities. Normal range of motion      Allergies  Allergen Reactions  . Codeine     Reporting amnesia  . Prochlorperazine Edisylate     Jaws locked  . Penicillins Hives    Past Medical History  Diagnosis Date  . Asthma   . Hypertension   . Thyroid disease   . Cervical cancer 1997    poorly differentiated cervical carcinoma. S/p TAH and radiation x6 wks   . Endometrial cancer 1997    coincidental endometrial cancer also  . Hyperlipemia   . 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 Vertebral body hemangioma     T12  . Small bowel obstruction     12/2008, repeat SBO in 02/2011 also with terminal ilietis   . Complication of anesthesia     took 2 days to wake up after 1 surgery    Past Surgical History  Procedure Laterality Date  . Back surgery    . Tubal ligation    . Skin biopsy    . Abdominal hysterectomy  Feb 1997    TAH/BSO, pelvic and periaortic lymphadenectomy   . Partial nephrectomy      right    Current Outpatient Prescriptions  Medication Sig Dispense Refill  . acidophilus (RISAQUAD) CAPS Take 1 capsule by mouth daily.      . benazepril-hydrochlorthiazide (LOTENSIN HCT) 20-12.5 MG per tablet Take 1 tablet by mouth daily.      Marland Kitchen  bismuth subsalicylate (PEPTO BISMOL) 262 MG chewable tablet Chew 524 mg by mouth as needed for indigestion. Nausea      . calcium-vitamin D (OSCAL WITH D) 500-200 MG-UNIT per tablet Take 1 tablet by mouth daily.      . cholecalciferol (VITAMIN D) 1000 UNITS tablet Take 1,000 Units by mouth 2 (two) times daily.       Marland Kitchen HYDROcodone-acetaminophen (NORCO/VICODIN) 5-325 MG per tablet Take 1-2 tablets by mouth every 6  (six) hours as needed for pain.  17 tablet  0  . levothyroxine (SYNTHROID, LEVOTHROID) 75 MCG tablet Take 75 mcg by mouth daily.      . magnesium gluconate (MAGONATE) 500 MG tablet Take 500 mg by mouth every evening.       . Multiple Vitamins-Minerals (MULTIVITAMIN WITH MINERALS) tablet Take 1 tablet by mouth daily.      Marland Kitchen omega-3 acid ethyl esters (LOVAZA) 1 G capsule Take 1 g by mouth 2 (two) times daily.      . ondansetron (ZOFRAN ODT) 4 MG disintegrating tablet Take 1 tablet (4 mg total) by mouth every 8 (eight) hours as needed for nausea.  15 tablet  0  . simvastatin (ZOCOR) 20 MG tablet Take 20 mg by mouth every evening.      . vitamin C (ASCORBIC ACID) 500 MG tablet Take 500 mg by mouth daily.       No current facility-administered medications for this visit.    History   Social History  . Marital Status: Divorced    Spouse Name: N/A    Number of Children: N/A  . Years of Education: N/A   Occupational History  . Not on file.   Social History Main Topics  . Smoking status: Never Smoker   . Smokeless tobacco: Never Used  . Alcohol Use: No  . Drug Use: No  . Sexually Active: Not on file   Other Topics Concern  . Not on file   Social History Narrative   Lives at home alone, works out at [employer] 6 days a week, still very active. Has an ex-husband who she is in contact with. Son died of an MI and another son in [place].     Family History  Problem Relation Age of Onset  . Hypertension Other   . Diabetes Other   . Stroke Other   . CAD Other       CLARKE-PEARSON,Avneet Ashmore L, MD 05/10/2012, 1:20 PM

## 2012-05-10 NOTE — Patient Instructions (Signed)
Return to see [place] in one year. Continue to have annual mammograms.

## 2012-09-15 ENCOUNTER — Other Ambulatory Visit: Payer: Self-pay

## 2012-10-21 ENCOUNTER — Ambulatory Visit (INDEPENDENT_AMBULATORY_CARE_PROVIDER_SITE_OTHER): Payer: Medicare Other | Admitting: *Deleted

## 2012-10-21 VITALS — BP 130/82 | HR 64 | Temp 98.8°F | Resp 18

## 2012-10-21 DIAGNOSIS — Z23 Encounter for immunization: Secondary | ICD-10-CM

## 2013-02-06 ENCOUNTER — Emergency Department (HOSPITAL_COMMUNITY): Payer: Medicare Other

## 2013-02-06 ENCOUNTER — Encounter (HOSPITAL_COMMUNITY): Payer: Self-pay | Admitting: Emergency Medicine

## 2013-02-06 ENCOUNTER — Emergency Department (HOSPITAL_COMMUNITY)
Admission: EM | Admit: 2013-02-06 | Discharge: 2013-02-06 | Disposition: A | Discharge status: Home / Self Care | Payer: Medicare Other | Attending: Emergency Medicine | Admitting: Emergency Medicine

## 2013-02-06 DIAGNOSIS — R109 Unspecified abdominal pain: Secondary | ICD-10-CM | POA: Insufficient documentation

## 2013-02-06 DIAGNOSIS — E079 Disorder of thyroid, unspecified: Secondary | ICD-10-CM | POA: Insufficient documentation

## 2013-02-06 DIAGNOSIS — I10 Essential (primary) hypertension: Secondary | ICD-10-CM | POA: Insufficient documentation

## 2013-02-06 DIAGNOSIS — Z880 Allergy status to penicillin: Secondary | ICD-10-CM | POA: Insufficient documentation

## 2013-02-06 DIAGNOSIS — Z9851 Tubal ligation status: Secondary | ICD-10-CM | POA: Insufficient documentation

## 2013-02-06 DIAGNOSIS — Z9889 Other specified postprocedural states: Secondary | ICD-10-CM | POA: Insufficient documentation

## 2013-02-06 DIAGNOSIS — R112 Nausea with vomiting, unspecified: Secondary | ICD-10-CM | POA: Insufficient documentation

## 2013-02-06 DIAGNOSIS — Z8542 Personal history of malignant neoplasm of other parts of uterus: Secondary | ICD-10-CM | POA: Insufficient documentation

## 2013-02-06 DIAGNOSIS — E785 Hyperlipidemia, unspecified: Secondary | ICD-10-CM | POA: Insufficient documentation

## 2013-02-06 DIAGNOSIS — Z79899 Other long term (current) drug therapy: Secondary | ICD-10-CM | POA: Insufficient documentation

## 2013-02-06 DIAGNOSIS — Z8719 Personal history of other diseases of the digestive system: Secondary | ICD-10-CM | POA: Insufficient documentation

## 2013-02-06 DIAGNOSIS — Z87448 Personal history of other diseases of urinary system: Secondary | ICD-10-CM | POA: Insufficient documentation

## 2013-02-06 DIAGNOSIS — Z8541 Personal history of malignant neoplasm of cervix uteri: Secondary | ICD-10-CM | POA: Insufficient documentation

## 2013-02-06 DIAGNOSIS — Z90710 Acquired absence of both cervix and uterus: Secondary | ICD-10-CM | POA: Insufficient documentation

## 2013-02-06 DIAGNOSIS — J45909 Unspecified asthma, uncomplicated: Secondary | ICD-10-CM | POA: Insufficient documentation

## 2013-02-06 LAB — CBC WITH DIFFERENTIAL/PLATELET
Basophils Absolute: 0 10*3/uL (ref 0.0–0.1)
Basophils Relative: 0 % (ref 0–1)
Eosinophils Absolute: 0 10*3/uL (ref 0.0–0.7)
Eosinophils Relative: 0 % (ref 0–5)
HCT: 41.3 % (ref 36.0–46.0)
Hemoglobin: 14 g/dL (ref 12.0–15.0)
Lymphocytes Relative: 7 % — ABNORMAL LOW (ref 12–46)
Lymphs Abs: 0.8 10*3/uL (ref 0.7–4.0)
MCH: 22.6 pg — ABNORMAL LOW (ref 26.0–34.0)
MCHC: 33.9 g/dL (ref 30.0–36.0)
MCV: 66.7 fL — ABNORMAL LOW (ref 78.0–100.0)
Monocytes Absolute: 0.3 10*3/uL (ref 0.1–1.0)
Monocytes Relative: 3 % (ref 3–12)
Neutro Abs: 9.7 10*3/uL — ABNORMAL HIGH (ref 1.7–7.7)
Neutrophils Relative %: 90 % — ABNORMAL HIGH (ref 43–77)
Platelets: 228 10*3/uL (ref 150–400)
RBC: 6.19 MIL/uL — ABNORMAL HIGH (ref 3.87–5.11)
RDW: 14.6 % (ref 11.5–15.5)
WBC: 10.8 10*3/uL — ABNORMAL HIGH (ref 4.0–10.5)

## 2013-02-06 LAB — URINALYSIS, ROUTINE W REFLEX MICROSCOPIC
Bilirubin Urine: NEGATIVE
Glucose, UA: NEGATIVE mg/dL
Hgb urine dipstick: NEGATIVE
Ketones, ur: NEGATIVE mg/dL
Leukocytes, UA: NEGATIVE
Nitrite: NEGATIVE
Protein, ur: NEGATIVE mg/dL
Specific Gravity, Urine: 1.019 (ref 1.005–1.030)
Urobilinogen, UA: 0.2 mg/dL (ref 0.0–1.0)
pH: 8 (ref 5.0–8.0)

## 2013-02-06 LAB — BASIC METABOLIC PANEL
BUN: 18 mg/dL (ref 6–23)
CO2: 22 mEq/L (ref 19–32)
Calcium: 9.6 mg/dL (ref 8.4–10.5)
Chloride: 99 mEq/L (ref 96–112)
Creatinine, Ser: 0.63 mg/dL (ref 0.50–1.10)
GFR calc Af Amer: >90 mL/min (ref >90)
GFR calc non Af Amer: >90 mL/min (ref >90)
Glucose, Bld: 143 mg/dL — ABNORMAL HIGH (ref 70–99)
Potassium: 3.8 mEq/L (ref 3.5–5.1)
Sodium: 135 mEq/L (ref 135–145)

## 2013-02-06 MED ORDER — ONDANSETRON 8 MG PO TBDP: 8.0000 mg | ORAL_TABLET | Freq: Three times a day (TID) | ORAL | Status: DC | PRN

## 2013-02-06 MED ORDER — IOHEXOL 300 MG/ML  SOLN: 50.0000 mL | Freq: Once | INTRAMUSCULAR | Status: DC | PRN

## 2013-02-06 MED ORDER — SODIUM CHLORIDE 0.9 % IV SOLN
1000.0000 mL | Freq: Once | INTRAVENOUS | Status: AC
  Administered 2013-02-06: 1000 mL via INTRAVENOUS

## 2013-02-06 MED ORDER — MORPHINE SULFATE 4 MG/ML IJ SOLN
4.0000 mg | Freq: Once | INTRAMUSCULAR | Status: DC
  Filled 2013-02-06: qty 1

## 2013-02-06 NOTE — ED Notes (Signed)
Per pt, comes in for the same symptoms-has a narrowing of bowel ane she ate something that is causing abdominal pain, N/V/D

## 2013-02-06 NOTE — ED Provider Notes (Signed)
CSN: [id number]     Arrival date & time 02/06/13  1220 History   First MD Initiated Contact with Patient 02/06/13 1241     Chief Complaint  Patient presents with  . Abdominal Pain   (Consider location/radiation/quality/duration/timing/severity/associated sxs/prior Treatment) HPI  patient presents emergency department with nausea, vomiting, and abdominal pain patient, states she has a history of this type of abdominal pain, following a radical hysterectomy, worse at issue with the, distal portion of the small bowel, becoming shortened and narrow.  Patient, states, that she popcorn last night right before these symptoms occurred.  Patient denies chest pain, shortness breath, fever, weakness, dizziness, or blurred vision, headache, rash, or syncope.  Patient, states she's had several episodes of this in the past, but brought her to the hospital. Past Medical History  Diagnosis Date  . Asthma   . Hypertension   . Thyroid disease   . Cervical cancer 1997    poorly differentiated cervical carcinoma. S/p TAH and radiation x6 wks   . Endometrial cancer 1997    coincidental endometrial cancer also  . Hyperlipemia   . 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 Vertebral body hemangioma     T12  . Small bowel obstruction     12/2008, repeat SBO in 02/2011 also with terminal ilietis   . Complication of anesthesia     took 2 days to wake up after 1 surgery   Past Surgical History  Procedure Laterality Date  . Back surgery    . Tubal ligation    . Skin biopsy    . Abdominal hysterectomy  Feb 1997    TAH/BSO, pelvic and periaortic lymphadenectomy   . Partial nephrectomy      right   Family History  Problem Relation Age of Onset  . Hypertension Other   . Diabetes Other   . Stroke Other   . CAD Other    History  Substance Use Topics  . Smoking status: Never Smoker   . Smokeless tobacco: Never Used  . Alcohol Use: No   OB History   Grav Para Term Preterm  Abortions TAB SAB Ect Mult Living                 Review of Systems All other systems negative except as documented in the HPI. All pertinent positives and negatives as reviewed in the HPI.  Allergies  Codeine; Prochlorperazine edisylate; and Penicillins  Home Medications   Current Outpatient Rx  Name  Route  Sig  Dispense  Refill  . acidophilus (RISAQUAD) CAPS   Oral   Take 1 capsule by mouth daily.         . benazepril-hydrochlorthiazide (LOTENSIN HCT) 20-12.5 MG per tablet   Oral   Take 1 tablet by mouth daily.         . calcium-vitamin D (OSCAL WITH D) 500-200 MG-UNIT per tablet   Oral   Take 1 tablet by mouth daily.         . cholecalciferol (VITAMIN D) 1000 UNITS tablet   Oral   Take 1,000 Units by mouth 2 (two) times daily.          Marland Kitchen HYDROcodone-acetaminophen (NORCO/VICODIN) 5-325 MG per tablet   Oral   Take 1-2 tablets by mouth every 6 (six) hours as needed for pain.   17 tablet   0   . levothyroxine (SYNTHROID, LEVOTHROID) 75 MCG tablet   Oral   Take 75 mcg by  mouth daily.         . magnesium gluconate (MAGONATE) 500 MG tablet   Oral   Take 500 mg by mouth every evening.          . Multiple Vitamins-Minerals (MULTIVITAMIN WITH MINERALS) tablet   Oral   Take 1 tablet by mouth daily.         Marland Kitchen omega-3 acid ethyl esters (LOVAZA) 1 G capsule   Oral   Take 1 g by mouth 2 (two) times daily.         . simvastatin (ZOCOR) 20 MG tablet   Oral   Take 20 mg by mouth every evening.         . vitamin C (ASCORBIC ACID) 500 MG tablet   Oral   Take 500 mg by mouth daily.          BP 178/100  Pulse 70  Temp(Src) 98.2 F (36.8 C) (Oral)  Resp 16  SpO2 100% Physical Exam  Nursing note and vitals reviewed. Constitutional: She is oriented to person, place, and time. She appears well-developed and well-nourished. No distress.  HENT:  Head: Normocephalic and atraumatic.  Mouth/Throat: Oropharynx is clear and moist.  Eyes: Pupils are  equal, round, and reactive to light.  Neck: Normal range of motion. Neck supple.  Cardiovascular: Normal rate, regular rhythm and normal heart sounds.  Exam reveals no gallop and no friction rub.   No murmur heard. Pulmonary/Chest: Effort normal and breath sounds normal.  Abdominal: Soft. She exhibits no distension. Bowel sounds are increased. There is tenderness. There is no rebound and no guarding.    Neurological: She is alert and oriented to person, place, and time. A cranial nerve deficit is present.  Skin: Skin is warm and dry. No erythema.    ED Course  Procedures (including critical care time) Labs Review Labs Reviewed  CBC WITH DIFFERENTIAL - Abnormal; Notable for the following:    WBC 10.8 (*)    RBC 6.19 (*)    MCV 66.7 (*)    MCH 22.6 (*)    Neutrophils Relative % 90 (*)    Lymphocytes Relative 7 (*)    Neutro Abs 9.7 (*)    All other components within normal limits  BASIC METABOLIC PANEL - Abnormal; Notable for the following:    Glucose, Bld 143 (*)    All other components within normal limits  URINALYSIS, ROUTINE W REFLEX MICROSCOPIC - Abnormal; Notable for the following:    APPearance CLOUDY (*)    All other components within normal limits   I patient refused CT scan.  We will have her tried oral fluid trial and if she is able to tolerate oral fluids.  She would like to forego the CT scan.  Patient is advised followup with her primary care Dr. told to return here as needed  Carlyle Dolly, PA-C 02/07/13 1519

## 2013-02-06 NOTE — ED Notes (Signed)
Patient reports taking hydrocodone 30 minutes prior to arrival.

## 2013-02-06 NOTE — ED Notes (Signed)
Pt able to drink fluids. Pt denies n/v

## 2013-02-10 NOTE — ED Provider Notes (Signed)
Medical screening examination/treatment/procedure(s) were conducted as a shared visit with non-physician practitioner(s) and myself.  I personally evaluated the patient during the encounter.  EKG Interpretation   None       Feels much better. Tolerating oral fluids. Abdomen soft. Dc home. Gi follow up. Return instructions given  Hoy Morn, MD 02/10/13 [phone]

## 2013-04-14 ENCOUNTER — Emergency Department (HOSPITAL_COMMUNITY)
Admission: EM | Admit: 2013-04-14 | Discharge: 2013-04-15 | Disposition: A | Discharge status: Home / Self Care | Payer: Medicare Other | Attending: Emergency Medicine | Admitting: Emergency Medicine

## 2013-04-14 ENCOUNTER — Emergency Department (HOSPITAL_COMMUNITY): Payer: Medicare Other

## 2013-04-14 ENCOUNTER — Encounter (HOSPITAL_COMMUNITY): Payer: Self-pay | Admitting: Emergency Medicine

## 2013-04-14 DIAGNOSIS — E079 Disorder of thyroid, unspecified: Secondary | ICD-10-CM | POA: Insufficient documentation

## 2013-04-14 DIAGNOSIS — Z87448 Personal history of other diseases of urinary system: Secondary | ICD-10-CM | POA: Insufficient documentation

## 2013-04-14 DIAGNOSIS — Z923 Personal history of irradiation: Secondary | ICD-10-CM | POA: Insufficient documentation

## 2013-04-14 DIAGNOSIS — Z9851 Tubal ligation status: Secondary | ICD-10-CM | POA: Insufficient documentation

## 2013-04-14 DIAGNOSIS — Z8542 Personal history of malignant neoplasm of other parts of uterus: Secondary | ICD-10-CM | POA: Insufficient documentation

## 2013-04-14 DIAGNOSIS — R197 Diarrhea, unspecified: Secondary | ICD-10-CM | POA: Insufficient documentation

## 2013-04-14 DIAGNOSIS — Z90710 Acquired absence of both cervix and uterus: Secondary | ICD-10-CM | POA: Insufficient documentation

## 2013-04-14 DIAGNOSIS — Z79899 Other long term (current) drug therapy: Secondary | ICD-10-CM | POA: Insufficient documentation

## 2013-04-14 DIAGNOSIS — Z880 Allergy status to penicillin: Secondary | ICD-10-CM | POA: Insufficient documentation

## 2013-04-14 DIAGNOSIS — Z8719 Personal history of other diseases of the digestive system: Secondary | ICD-10-CM | POA: Insufficient documentation

## 2013-04-14 DIAGNOSIS — Z8541 Personal history of malignant neoplasm of cervix uteri: Secondary | ICD-10-CM | POA: Insufficient documentation

## 2013-04-14 DIAGNOSIS — J45909 Unspecified asthma, uncomplicated: Secondary | ICD-10-CM | POA: Insufficient documentation

## 2013-04-14 DIAGNOSIS — I10 Essential (primary) hypertension: Secondary | ICD-10-CM | POA: Insufficient documentation

## 2013-04-14 DIAGNOSIS — E785 Hyperlipidemia, unspecified: Secondary | ICD-10-CM | POA: Insufficient documentation

## 2013-04-14 DIAGNOSIS — R109 Unspecified abdominal pain: Secondary | ICD-10-CM | POA: Insufficient documentation

## 2013-04-14 DIAGNOSIS — R112 Nausea with vomiting, unspecified: Secondary | ICD-10-CM | POA: Insufficient documentation

## 2013-04-14 LAB — COMPREHENSIVE METABOLIC PANEL
ALT: 19 U/L (ref 0–35)
AST: 20 U/L (ref 0–37)
Albumin: 4.5 g/dL (ref 3.5–5.2)
Alkaline Phosphatase: 92 U/L (ref 39–117)
BUN: 16 mg/dL (ref 6–23)
CO2: 23 mEq/L (ref 19–32)
Calcium: 10.5 mg/dL (ref 8.4–10.5)
Chloride: 94 mEq/L — ABNORMAL LOW (ref 96–112)
Creatinine, Ser: 0.67 mg/dL (ref 0.50–1.10)
GFR calc Af Amer: >90 mL/min (ref >90)
GFR calc non Af Amer: 88 mL/min — ABNORMAL LOW (ref >90)
Glucose, Bld: 153 mg/dL — ABNORMAL HIGH (ref 70–99)
Potassium: 3.6 mEq/L — ABNORMAL LOW (ref 3.7–5.3)
Sodium: 137 mEq/L (ref 137–147)
Total Bilirubin: 0.7 mg/dL (ref 0.3–1.2)
Total Protein: 7.8 g/dL (ref 6.0–8.3)

## 2013-04-14 LAB — CBC WITH DIFFERENTIAL/PLATELET
Basophils Absolute: 0 10*3/uL (ref 0.0–0.1)
Basophils Relative: 0 % (ref 0–1)
Eosinophils Absolute: 0 10*3/uL (ref 0.0–0.7)
Eosinophils Relative: 0 % (ref 0–5)
HCT: 40.8 % (ref 36.0–46.0)
Hemoglobin: 13.6 g/dL (ref 12.0–15.0)
Lymphocytes Relative: 8 % — ABNORMAL LOW (ref 12–46)
Lymphs Abs: 0.8 10*3/uL (ref 0.7–4.0)
MCH: 21.9 pg — ABNORMAL LOW (ref 26.0–34.0)
MCHC: 33.3 g/dL (ref 30.0–36.0)
MCV: 65.7 fL — ABNORMAL LOW (ref 78.0–100.0)
Monocytes Absolute: 0.6 10*3/uL (ref 0.1–1.0)
Monocytes Relative: 6 % (ref 3–12)
Neutro Abs: 9.2 10*3/uL — ABNORMAL HIGH (ref 1.7–7.7)
Neutrophils Relative %: 86 % — ABNORMAL HIGH (ref 43–77)
Platelets: 218 10*3/uL (ref 150–400)
RBC: 6.21 MIL/uL — ABNORMAL HIGH (ref 3.87–5.11)
RDW: 13.9 % (ref 11.5–15.5)
WBC: 10.6 10*3/uL — ABNORMAL HIGH (ref 4.0–10.5)

## 2013-04-14 LAB — LIPASE, BLOOD: Lipase: 46 U/L (ref 11–59)

## 2013-04-14 MED ORDER — FENTANYL CITRATE 0.05 MG/ML IJ SOLN
100.0000 ug | Freq: Once | INTRAMUSCULAR | Status: AC
  Administered 2013-04-14: 50 ug via INTRAVENOUS
  Filled 2013-04-14: qty 2

## 2013-04-14 MED ORDER — ONDANSETRON HCL 4 MG/2ML IJ SOLN
4.0000 mg | Freq: Once | INTRAMUSCULAR | Status: AC
  Administered 2013-04-14: 4 mg via INTRAVENOUS
  Filled 2013-04-14: qty 2

## 2013-04-14 NOTE — ED Notes (Signed)
Pt has hx of breast cancer. Pt. sts her doctors have been watching for a narrowing of her small bowel. Pt concerned that's the problem. Pt c/o central abdominal pain and distention. Pt had multiple bowel movements today. Pt denies diarrhea, fever. A&Ox4.

## 2013-04-14 NOTE — ED Notes (Signed)
Gave pt 51mcg of the 149mcg of fentanyl. Pt began to st she felt dizzy.  BP dropped from 126/70s to 109/60s. Pt heart rate dropped from 62 to 45.  Hung fluids on pt. And laid pts. Head back. Pt vitals now at 126/80, HR 57, O2 96.

## 2013-04-14 NOTE — ED Notes (Signed)
Pt states this morning started having abdominal pain, nausea, vomiting, diarrhea, states has had 5 episodes since January, states she knows what is causing it, it's narrowing of small bowel.

## 2013-04-15 LAB — URINE MICROSCOPIC-ADD ON

## 2013-04-15 LAB — URINALYSIS, ROUTINE W REFLEX MICROSCOPIC
Bilirubin Urine: NEGATIVE
Glucose, UA: NEGATIVE mg/dL
Hgb urine dipstick: NEGATIVE
Ketones, ur: 15 mg/dL — AB
Nitrite: NEGATIVE
Protein, ur: NEGATIVE mg/dL
Specific Gravity, Urine: 1.021 (ref 1.005–1.030)
Urobilinogen, UA: 0.2 mg/dL (ref 0.0–1.0)
pH: 8 (ref 5.0–8.0)

## 2013-04-15 MED ORDER — DICYCLOMINE HCL 20 MG PO TABS
20.0000 mg | ORAL_TABLET | Freq: Four times a day (QID) | ORAL | Status: DC | PRN
Start: 2013-04-15 — End: 2018-11-01

## 2013-04-15 MED ORDER — DICYCLOMINE HCL 20 MG PO TABS
20.0000 mg | ORAL_TABLET | Freq: Once | ORAL | Status: AC
  Administered 2013-04-15: 20 mg via ORAL
  Filled 2013-04-15: qty 1

## 2013-04-15 NOTE — ED Provider Notes (Signed)
CSN: [id number]     Arrival date & time 04/14/13  2149 History   First MD Initiated Contact with Patient 04/14/13 2255     Chief Complaint  Patient presents with  . Abdominal Pain  . Emesis  . Diarrhea     (Consider location/radiation/quality/duration/timing/severity/associated sxs/prior Treatment) HPI 68 year old female presents to emergency apartment with complaint of one-day history of diffuse abdominal pain, nausea, vomiting, and loose stools.  Patient reports she has history of same, attributes it to a narrowing in her distal small bowel.  She does have history of small bowel obstruction.  2 years ago.  Patient with history of endometrial and cervical cancer status post resection and radiation.  Patient is concerned that the narrowing in her small bowel has worsened.  She reports since Christmas.  She has had multiple episodes of similar nausea, vomiting, diarrhea, without abdominal pain, usually resolves within a few hours.  As she had abdominal pain, tonight, it concerned her bring her to the ER.  Patient is feeling better after pain and nausea medication.  Denies any fever or chills.  No sick contacts.  Patient had colonoscopy done 7 years ago, has plans to followup with a new gastroenterologist .  She reports symptoms are usually worse with emotional upset and/or change in her diet, eating small seeds or nuts  Past Medical History  Diagnosis Date  . Asthma   . Hypertension   . Thyroid disease   . Cervical cancer 1997    poorly differentiated cervical carcinoma. S/p TAH and radiation x6 wks   . Endometrial cancer 1997    coincidental endometrial cancer also  . Hyperlipemia   . 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 Vertebral body hemangioma     T12  . Small bowel obstruction     12/2008, repeat SBO in 02/2011 also with terminal ilietis   . Complication of anesthesia     took 2 days to wake up after 1 surgery   Past Surgical History  Procedure Laterality  Date  . Back surgery    . Tubal ligation    . Skin biopsy    . Abdominal hysterectomy  Feb 1997    TAH/BSO, pelvic and periaortic lymphadenectomy   . Partial nephrectomy      right   Family History  Problem Relation Age of Onset  . Hypertension Other   . Diabetes Other   . Stroke Other   . CAD Other    History  Substance Use Topics  . Smoking status: Never Smoker   . Smokeless tobacco: Never Used  . Alcohol Use: No   OB History   Grav Para Term Preterm Abortions TAB SAB Ect Mult Living                 Review of Systems   See History of Present Illness; otherwise all other systems are reviewed and negative  Allergies  Codeine; Prochlorperazine edisylate; and Penicillins  Home Medications   Current Outpatient Rx  Name  Route  Sig  Dispense  Refill  . acidophilus (RISAQUAD) CAPS   Oral   Take 1 capsule by mouth daily.         . benazepril-hydrochlorthiazide (LOTENSIN HCT) 20-12.5 MG per tablet   Oral   Take 1 tablet by mouth daily.         . calcium-vitamin D (OSCAL WITH D) 500-200 MG-UNIT per tablet   Oral   Take 1 tablet by mouth  daily.         . cholecalciferol (VITAMIN D) 1000 UNITS tablet   Oral   Take 1,000 Units by mouth 2 (two) times daily.          Marland Kitchen levothyroxine (SYNTHROID, LEVOTHROID) 75 MCG tablet   Oral   Take 75 mcg by mouth daily.         . magnesium gluconate (MAGONATE) 500 MG tablet   Oral   Take 500 mg by mouth every evening.          . Multiple Vitamins-Minerals (MULTIVITAMIN WITH MINERALS) tablet   Oral   Take 1 tablet by mouth daily.         Marland Kitchen omega-3 acid ethyl esters (LOVAZA) 1 G capsule   Oral   Take 1 g by mouth 2 (two) times daily.         . ondansetron (ZOFRAN ODT) 8 MG disintegrating tablet   Oral   Take 1 tablet (8 mg total) by mouth every 8 (eight) hours as needed for nausea or vomiting.   10 tablet   0   . simvastatin (ZOCOR) 20 MG tablet   Oral   Take 20 mg by mouth every evening.         .  vitamin C (ASCORBIC ACID) 500 MG tablet   Oral   Take 500 mg by mouth daily.          BP 191/82  Pulse 79  Temp(Src) 98.7 F (37.1 C) (Oral)  Resp 20  SpO2 100% Physical Exam  Nursing note and vitals reviewed. Constitutional: She is oriented to person, place, and time. She appears well-developed and well-nourished.  HENT:  Head: Normocephalic and atraumatic.  Nose: Nose normal.  Mouth/Throat: Oropharynx is clear and moist.  Eyes: Conjunctivae and EOM are normal. Pupils are equal, round, and reactive to light.  Neck: Normal range of motion. Neck supple. No JVD present. No tracheal deviation present. No thyromegaly present.  Cardiovascular: Normal rate, regular rhythm, normal heart sounds and intact distal pulses.  Exam reveals no gallop and no friction rub.   No murmur heard. Pulmonary/Chest: Effort normal and breath sounds normal. No stridor. No respiratory distress. She has no wheezes. She has no rales. She exhibits no tenderness.  Abdominal: Soft. Bowel sounds are normal. She exhibits no distension and no mass. There is tenderness (diffuse abdominal pain). There is no rebound and no guarding.  Musculoskeletal: Normal range of motion. She exhibits no edema and no tenderness.  Lymphadenopathy:    She has no cervical adenopathy.  Neurological: She is alert and oriented to person, place, and time. She exhibits normal muscle tone. Coordination normal.  Skin: Skin is warm and dry. No rash noted. No erythema. No pallor.  Psychiatric: She has a normal mood and affect. Her behavior is normal. Judgment and thought content normal.    ED Course  Procedures (including critical care time) Labs Review Labs Reviewed  CBC WITH DIFFERENTIAL - Abnormal; Notable for the following:    WBC 10.6 (*)    RBC 6.21 (*)    MCV 65.7 (*)    MCH 21.9 (*)    Neutrophils Relative % 86 (*)    Lymphocytes Relative 8 (*)    Neutro Abs 9.2 (*)    All other components within normal limits  COMPREHENSIVE  METABOLIC PANEL - Abnormal; Notable for the following:    Potassium 3.6 (*)    Chloride 94 (*)    Glucose, Bld 153 (*)  GFR calc non Af Amer 88 (*)    All other components within normal limits  URINALYSIS, ROUTINE W REFLEX MICROSCOPIC - Abnormal; Notable for the following:    APPearance CLOUDY (*)    Ketones, ur 15 (*)    Leukocytes, UA SMALL (*)    All other components within normal limits  URINE MICROSCOPIC-ADD ON - Abnormal; Notable for the following:    Bacteria, UA FEW (*)    All other components within normal limits  LIPASE, BLOOD   Imaging Review Dg Abd Acute W/chest  04/15/2013   CLINICAL DATA:  Mid to low abdominal pain. Nausea. History cervical/ endometrial cancer.  EXAM: ACUTE ABDOMEN SERIES (ABDOMEN 2 VIEW & CHEST 1 VIEW)  COMPARISON:  05/05/2011.  FINDINGS: Lungs clear without plain film evidence of pulmonary malignancy.  Tortuous aorta.  Heart size within normal limits.  Scoliosis.  Surgical clips lower abdomen and pelvis.  Paucity bowel gas.  No free intraperitoneal air.  IMPRESSION: Paucity bowel gas with secretion and gas-filled stomach.  No free intraperitoneal air.   Electronically Signed   By: Chauncey Cruel M.D.   On: 04/15/2013 00:58     EKG Interpretation None      MDM   Final diagnoses:  Nausea vomiting and diarrhea  Abdominal pain    68 year old female with one day of nausea, vomiting, diarrhea, and abdominal pain.  I feel that obstruction is less likely given the fact that she is having bowel movements.  I have encouraged patient to followup with gastroenterology for further workup of her ongoing GI issues.  We'll plan to get labs, acute abdominal series, and follow her exam. 2:02 AM Reexamine.  She has no further abdominal pain.  No further vomiting.  She is thirsty.  We'll give by mouth challenge.  Acute abdomen shows paucity of bowel gas, but no signs of obstruction.  No free intraperitoneal air.  Pt feels comfortable with d/c home, and f/u outpatient  GI.      Kalman Drape, MD 04/15/13 [phone]

## 2013-04-15 NOTE — Discharge Instructions (Signed)
Abdominal Pain, Adult Many things can cause abdominal pain. Usually, abdominal pain is not caused by a disease and will improve without treatment. It can often be observed and treated at home. Your health care provider will do a physical exam and possibly order blood tests and X-rays to help determine the seriousness of your pain. However, in many cases, more time must pass before a clear cause of the pain can be found. Before that point, your health care provider may not know if you need more testing or further treatment. HOME CARE INSTRUCTIONS  Monitor your abdominal pain for any changes. The following actions may help to alleviate any discomfort you are experiencing:  Only take over-the-counter or prescription medicines as directed by your health care provider.  Do not take laxatives unless directed to do so by your health care provider.  Try a clear liquid diet (broth, tea, or water) as directed by your health care provider. Slowly move to a bland diet as tolerated. SEEK MEDICAL CARE IF:  You have unexplained abdominal pain.  You have abdominal pain associated with nausea or diarrhea.  You have pain when you urinate or have a bowel movement.  You experience abdominal pain that wakes you in the night.  You have abdominal pain that is worsened or improved by eating food.  You have abdominal pain that is worsened with eating fatty foods. SEEK IMMEDIATE MEDICAL CARE IF:   Your pain does not go away within 2 hours.  You have a fever.  You keep throwing up (vomiting).  Your pain is felt only in portions of the abdomen, such as the right side or the left lower portion of the abdomen.  You pass bloody or black tarry stools. MAKE SURE YOU:  Understand these instructions.   Will watch your condition.   Will get help right away if you are not doing well or get worse.  Document Released: 11/06/2004 Document Revised: 11/17/2012 Document Reviewed: 10/06/2012 [facility] Patient  Information 2014 [place].  Diet and Irritable Bowel Syndrome  No cure has been found for irritable bowel syndrome (IBS). Many options are available to treat the symptoms. Your caregiver will give you the best treatments available for your symptoms. He or she will also encourage you to manage stress and to make changes to your diet. You need to work with your caregiver and Registered Dietician to find the best combination of medicine, diet, counseling, and support to control your symptoms. The following are some diet suggestions. FOODS THAT MAKE IBS WORSE  Fatty foods, such as [place] fries.  Milk products, such as cheese or ice cream.  Chocolate.  Alcohol.  Caffeine (found in coffee and some sodas).  Carbonated drinks, such as soda. If certain foods cause symptoms, you should eat less of them or stop eating them. FOOD JOURNAL   Keep a journal of the foods that seem to cause distress. Write down:  What you are eating during the day and when.  What problems you are having after eating.  When the symptoms occur in relation to your meals.  What foods always make you feel badly.  Take your notes with you to your caregiver to see if you should stop eating certain foods. FOODS THAT MAKE IBS BETTER Fiber reduces IBS symptoms, especially constipation, because it makes stools soft, bulky, and easier to pass. Fiber is found in bran, bread, cereal, beans, fruit, and vegetables. Examples of foods with fiber include:  Apples.  Peaches.  Pears.  Berries.  Figs.  Broccoli, raw.  Cabbage.  Carrots.  Raw peas.  Kidney beans.  Lima beans.  Whole-grain bread.  Whole-grain cereal. Add foods with fiber to your diet a little at a time. This will let your body get used to them. Too much fiber at once might cause gas and swelling of your abdomen. This can trigger symptoms in a person with IBS. Caregivers usually recommend a diet with enough fiber to produce soft, painless  bowel movements. High fiber diets may cause gas and bloating. However, these symptoms often go away within a few weeks, as your body adjusts. In many cases, dietary fiber may lessen IBS symptoms, particularly constipation. However, it may not help pain or diarrhea. High fiber diets keep the colon mildly enlarged (distended) with the added fiber. This may help prevent spasms in the colon. Some forms of fiber also keep water in the stool, thereby preventing hard stools that are difficult to pass.  Besides telling you to eat more foods with fiber, your caregiver may also tell you to get more fiber by taking a fiber pill or drinking water mixed with a special high fiber powder. An example of this is a natural fiber laxative containing psyllium seed.  TIPS  Large meals can cause cramping and diarrhea in people with IBS. If this happens to you, try eating 4 or 5 small meals a day, or try eating less at each of your usual 3 meals. It may also help if your meals are low in fat and high in carbohydrates. Examples of carbohydrates are pasta, rice, whole-grain breads and cereals, fruits, and vegetables.  If dairy products cause your symptoms to flare up, you can try eating less of those foods. You might be able to handle yogurt better than other dairy products, because it contains bacteria that helps with digestion. Dairy products are an important source of calcium and other nutrients. If you need to avoid dairy products, be sure to talk with a Registered Dietitian about getting these nutrients through other food sources.  Drink enough water and fluids to keep your urine clear or pale yellow. This is important, especially if you have diarrhea. FOR MORE INFORMATION  International Foundation for Functional Gastrointestinal Disorders: www.iffgd.org  National Digestive Diseases Information Clearinghouse: digestive.AmenCredit.is Document Released: 04/19/2003 Document Revised: 04/21/2011 Document Reviewed:  01/04/2007 [facility] Patient Information 2014 [place], [place].  Diarrhea Diarrhea is frequent loose and watery bowel movements. It can cause you to feel weak and dehydrated. Dehydration can cause you to become tired and thirsty, have a dry mouth, and have decreased urination that often is dark yellow. Diarrhea is a sign of another problem, most often an infection that will not last long. In most cases, diarrhea typically lasts 2 3 days. However, it can last longer if it is a sign of something more serious. It is important to treat your diarrhea as directed by your caregive to lessen or prevent future episodes of diarrhea. CAUSES  Some common causes include:  Gastrointestinal infections caused by viruses, bacteria, or parasites.  Food poisoning or food allergies.  Certain medicines, such as antibiotics, chemotherapy, and laxatives.  Artificial sweeteners and fructose.  Digestive disorders. HOME CARE INSTRUCTIONS  Ensure adequate fluid intake (hydration): have 1 cup (8 oz) of fluid for each diarrhea episode. Avoid fluids that contain simple sugars or sports drinks, fruit juices, whole milk products, and sodas. Your urine should be clear or pale yellow if you are drinking enough fluids. Hydrate with an oral rehydration solution that you can purchase  at pharmacies, retail stores, and online. You can prepare an oral rehydration solution at home by mixing the following ingredients together:    tsp table salt.   tsp baking soda.   tsp salt substitute containing potassium chloride.  1  tablespoons sugar.  1 L (34 oz) of water.  Certain foods and beverages may increase the speed at which food moves through the gastrointestinal (GI) tract. These foods and beverages should be avoided and include:  Caffeinated and alcoholic beverages.  High-fiber foods, such as raw fruits and vegetables, nuts, seeds, and whole grain breads and cereals.  Foods and beverages sweetened with sugar alcohols, such  as xylitol, sorbitol, and mannitol.  Some foods may be well tolerated and may help thicken stool including:  Starchy foods, such as rice, toast, pasta, low-sugar cereal, oatmeal, grits, baked potatoes, crackers, and bagels.  Bananas.  Applesauce.  Add probiotic-rich foods to help increase healthy bacteria in the GI tract, such as yogurt and fermented milk products.  Wash your hands well after each diarrhea episode.  Only take over-the-counter or prescription medicines as directed by your caregiver.  Take a warm bath to relieve any burning or pain from frequent diarrhea episodes. SEEK IMMEDIATE MEDICAL CARE IF:   You are unable to keep fluids down.  You have persistent vomiting.  You have blood in your stool, or your stools are black and tarry.  You do not urinate in 6 8 hours, or there is only a small amount of very dark urine.  You have abdominal pain that increases or localizes.  You have weakness, dizziness, confusion, or lightheadedness.  You have a severe headache.  Your diarrhea gets worse or does not get better.  You have a fever or persistent symptoms for more than 2 3 days.  You have a fever and your symptoms suddenly get worse. MAKE SURE YOU:   Understand these instructions.  Will watch your condition.  Will get help right away if you are not doing well or get worse. Document Released: 01/17/2002 Document Revised: 01/14/2012 Document Reviewed: 10/05/2011 [facility] Patient Information 2014 [place], [place].  Nausea and Vomiting Nausea is a sick feeling that often comes before throwing up (vomiting). Vomiting is a reflex where stomach contents come out of your mouth. Vomiting can cause severe loss of body fluids (dehydration). Children and elderly adults can become dehydrated quickly, especially if they also have diarrhea. Nausea and vomiting are symptoms of a condition or disease. It is important to find the cause of your symptoms. CAUSES   Direct irritation  of the stomach lining. This irritation can result from increased acid production (gastroesophageal reflux disease), infection, food poisoning, taking certain medicines (such as nonsteroidal anti-inflammatory drugs), alcohol use, or tobacco use.  Signals from the brain.These signals could be caused by a headache, heat exposure, an inner ear disturbance, increased pressure in the brain from injury, infection, a tumor, or a concussion, pain, emotional stimulus, or metabolic problems.  An obstruction in the gastrointestinal tract (bowel obstruction).  Illnesses such as diabetes, hepatitis, gallbladder problems, appendicitis, kidney problems, cancer, sepsis, atypical symptoms of a heart attack, or eating disorders.  Medical treatments such as chemotherapy and radiation.  Receiving medicine that makes you sleep (general anesthetic) during surgery. DIAGNOSIS Your caregiver may ask for tests to be done if the problems do not improve after a few days. Tests may also be done if symptoms are severe or if the reason for the nausea and vomiting is not clear. Tests may include:  Urine tests.  Blood tests.  Stool tests.  Cultures (to look for evidence of infection).  X-rays or other imaging studies. Test results can help your caregiver make decisions about treatment or the need for additional tests. TREATMENT You need to stay well hydrated. Drink frequently but in small amounts.You may wish to drink water, sports drinks, clear broth, or eat frozen ice pops or gelatin dessert to help stay hydrated.When you eat, eating slowly may help prevent nausea.There are also some antinausea medicines that may help prevent nausea. HOME CARE INSTRUCTIONS   Take all medicine as directed by your caregiver.  If you do not have an appetite, do not force yourself to eat. However, you must continue to drink fluids.  If you have an appetite, eat a normal diet unless your caregiver tells you differently.  Eat a  variety of complex carbohydrates (rice, wheat, potatoes, bread), lean meats, yogurt, fruits, and vegetables.  Avoid high-fat foods because they are more difficult to digest.  Drink enough water and fluids to keep your urine clear or pale yellow.  If you are dehydrated, ask your caregiver for specific rehydration instructions. Signs of dehydration may include:  Severe thirst.  Dry lips and mouth.  Dizziness.  Dark urine.  Decreasing urine frequency and amount.  Confusion.  Rapid breathing or pulse. SEEK IMMEDIATE MEDICAL CARE IF:   You have blood or brown flecks (like coffee grounds) in your vomit.  You have black or bloody stools.  You have a severe headache or stiff neck.  You are confused.  You have severe abdominal pain.  You have chest pain or trouble breathing.  You do not urinate at least once every 8 hours.  You develop cold or clammy skin.  You continue to vomit for longer than 24 to 48 hours.  You have a fever. MAKE SURE YOU:   Understand these instructions.  Will watch your condition.  Will get help right away if you are not doing well or get worse. Document Released: 01/27/2005 Document Revised: 04/21/2011 Document Reviewed: 06/26/2010 [facility] Patient Information 2014 [place], [place].

## 2013-04-22 ENCOUNTER — Ambulatory Visit: Payer: Medicare Other | Attending: Gynecology | Admitting: Gynecology

## 2013-04-22 ENCOUNTER — Encounter: Payer: Self-pay | Admitting: Gynecology

## 2013-04-22 VITALS — BP 149/93 | HR 70 | Temp 98.6°F | Resp 16 | Ht 65.5 in | Wt 153.3 lb

## 2013-04-22 DIAGNOSIS — L940 Localized scleroderma [morphea]: Secondary | ICD-10-CM | POA: Insufficient documentation

## 2013-04-22 DIAGNOSIS — J45909 Unspecified asthma, uncomplicated: Secondary | ICD-10-CM | POA: Insufficient documentation

## 2013-04-22 DIAGNOSIS — C539 Malignant neoplasm of cervix uteri, unspecified: Secondary | ICD-10-CM | POA: Insufficient documentation

## 2013-04-22 DIAGNOSIS — C549 Malignant neoplasm of corpus uteri, unspecified: Secondary | ICD-10-CM | POA: Insufficient documentation

## 2013-04-22 DIAGNOSIS — K56609 Unspecified intestinal obstruction, unspecified as to partial versus complete obstruction: Secondary | ICD-10-CM | POA: Insufficient documentation

## 2013-04-22 DIAGNOSIS — Z880 Allergy status to penicillin: Secondary | ICD-10-CM | POA: Insufficient documentation

## 2013-04-22 DIAGNOSIS — I10 Essential (primary) hypertension: Secondary | ICD-10-CM | POA: Insufficient documentation

## 2013-04-22 DIAGNOSIS — Z79899 Other long term (current) drug therapy: Secondary | ICD-10-CM | POA: Insufficient documentation

## 2013-04-22 DIAGNOSIS — E785 Hyperlipidemia, unspecified: Secondary | ICD-10-CM | POA: Insufficient documentation

## 2013-04-22 NOTE — Progress Notes (Signed)
Consult Note: Gyn-Onc   [name] Richards 68 y.o. female  Chief Complaint  Patient presents with  . Cervical Cancer    Assessment : Poorly differentiated endometrial and cervical cancer 1997. No evidence of disease. Intermittent partial small bowel obstruction.  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 We discussed decreasing fiber and her diet and observing as to whether to any other food that causes her bowel symptoms. The natural history of radiation to small bowel obstruction was reviewed. Patient contact me if she has worsening problems. Patient return to see [place] in one year. She'll continue to have annual mammograms.  Interval History: Patient returns today as previously scheduled for annual exam. Since her last visit she's had several episodes of partial small bowel obstruction all of which have been managed conservatively. It is recall that in 2013 she had a CT scan that showed an area of distal small bowel that appeared to be somewhat strictured.  Otherwise, she's doing well and is planning a trans-Atlantic cruise to [place] and another trip to [place] in the next few months.. She reports of lichen sclerosis is asymptomatic. She have mammograms recently that were normal. She has no GI or GU symptoms. Functional status is excellent. She works out 5 days a week.  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 She underwent a total abdominal hysterectomy and bilateral salpingo-oophorectomy, pelvic and para-aortic lymphadenectomy. She received postoperative whole pelvis radiation therapy.  Her other primary gynecologic problem has been lichen sclerosus managed with when necessary clobetasol.  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   Review of Systems:10 point review of systems is negative except as noted in interval history.   Vitals: Blood pressure 149/93, pulse 70, temperature 98.6 F (37 C), resp. rate 16, height 5' 5.5" (1.664 m), weight 153 lb 4.8 oz (69.536 kg), SpO2 100.00%.  Physical Exam: General : The patient is a healthy woman in no acute distress.  HEENT: normocephalic, extraoccular movements normal; neck is supple without thyromegally  Lynphnodes: Supraclavicular and inguinal nodes not enlarged  Breasts: Without masses, skin changes or nipple discharge. There is no axillary adenopathy.  Abdomen: Soft, non-tender, no ascites, no organomegally, no masses, no hernias  Pelvic:  EGBUS: Normal female mild lichen sclerosis.  Vagina: Normal, no lesions , atrophic Urethra and Bladder: Normal, non-tender  Cervix: Surgically absent  Uterus: Surgically absent  Bi-manual examination: Non-tender; no adenxal masses or nodularity  Rectal: normal sphincter tone, no masses, no blood  Lower extremities: No edema or varicosities. Normal range of motion      Allergies  Allergen Reactions  . Codeine     Reporting amnesia  . Prochlorperazine Edisylate     Jaws locked  . Penicillins Hives    Past Medical History  Diagnosis Date  . Asthma   . Hypertension   . Thyroid disease   . Cervical cancer 1997    poorly differentiated cervical carcinoma. S/p TAH and radiation x6 wks   . Endometrial cancer 1997    coincidental endometrial cancer also  . Hyperlipemia   . 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 Vertebral body hemangioma  T12  . Small bowel obstruction     12/2008, repeat SBO in 02/2011 also with terminal ilietis   . Complication of anesthesia     took 2 days to wake up after 1 surgery    Past Surgical History  Procedure  Laterality Date  . Back surgery    . Tubal ligation    . Skin biopsy    . Abdominal hysterectomy  Feb 1997    TAH/BSO, pelvic and periaortic lymphadenectomy   . Partial nephrectomy      right    Current Outpatient Prescriptions  Medication Sig Dispense Refill  . acidophilus (RISAQUAD) CAPS Take 1 capsule by mouth daily.      . benazepril-hydrochlorthiazide (LOTENSIN HCT) 20-12.5 MG per tablet Take 1 tablet by mouth daily.      . calcium-vitamin D (OSCAL WITH D) 500-200 MG-UNIT per tablet Take 1 tablet by mouth daily.      . cholecalciferol (VITAMIN D) 1000 UNITS tablet Take 1,000 Units by mouth 2 (two) times daily.       Marland Kitchen dicyclomine (BENTYL) 20 MG tablet Take 1 tablet (20 mg total) by mouth every 6 (six) hours as needed for spasms (for abdominal cramping).  20 tablet  0  . levothyroxine (SYNTHROID, LEVOTHROID) 75 MCG tablet Take 75 mcg by mouth daily.      . magnesium gluconate (MAGONATE) 500 MG tablet Take 500 mg by mouth every evening.       . Multiple Vitamins-Minerals (MULTIVITAMIN WITH MINERALS) tablet Take 1 tablet by mouth daily.      Marland Kitchen omega-3 acid ethyl esters (LOVAZA) 1 G capsule Take 1 g by mouth 2 (two) times daily.      . ondansetron (ZOFRAN ODT) 8 MG disintegrating tablet Take 1 tablet (8 mg total) by mouth every 8 (eight) hours as needed for nausea or vomiting.  10 tablet  0  . simvastatin (ZOCOR) 20 MG tablet Take 20 mg by mouth every evening.      . vitamin C (ASCORBIC ACID) 500 MG tablet Take 500 mg by mouth daily.       No current facility-administered medications for this visit.    History   Social History  . Marital Status: Divorced    Spouse Name: N/A    Number of Children: N/A  . Years of Education: N/A   Occupational History  . Not on file.   Social History Main Topics  . Smoking status: Never Smoker   . Smokeless tobacco: Never Used  . Alcohol Use: No  . Drug Use: No  . Sexual Activity: Not on file   Other Topics Concern  . Not on file    Social History Narrative   Lives at home alone, works out at [employer] 6 days a week, still very active. Has an ex-husband who she is in contact with. Son died of an MI and another son in [place].     Family History  Problem Relation Age of Onset  . Hypertension Other   . Diabetes Other   . Stroke Other   . CAD Other       CLARKE-PEARSON,Tauriel Scronce L, MD 04/22/2013, 10:06 AM

## 2013-04-22 NOTE — Patient Instructions (Signed)
Plan to follow up in one year or sooner if needed with Dr. Fermin Schwab.  Please call in Nov or Dec 2015 to schedule an appointment for March 2016.

## 2013-05-02 ENCOUNTER — Ambulatory Visit: Payer: Medicare Other | Admitting: Gynecology

## 2014-02-10 DIAGNOSIS — C519 Malignant neoplasm of vulva, unspecified: Secondary | ICD-10-CM

## 2014-02-10 HISTORY — DX: Malignant neoplasm of vulva, unspecified: C51.9

## 2014-03-07 ENCOUNTER — Emergency Department (HOSPITAL_COMMUNITY)
Admission: EM | Admit: 2014-03-07 | Discharge: 2014-03-07 | Disposition: A | Discharge status: Home / Self Care | Payer: Medicare Other | Attending: Emergency Medicine | Admitting: Emergency Medicine

## 2014-03-07 ENCOUNTER — Emergency Department (HOSPITAL_COMMUNITY): Payer: Medicare Other

## 2014-03-07 ENCOUNTER — Encounter (HOSPITAL_COMMUNITY): Payer: Self-pay | Admitting: *Deleted

## 2014-03-07 DIAGNOSIS — R1084 Generalized abdominal pain: Secondary | ICD-10-CM

## 2014-03-07 DIAGNOSIS — R112 Nausea with vomiting, unspecified: Secondary | ICD-10-CM | POA: Insufficient documentation

## 2014-03-07 DIAGNOSIS — E079 Disorder of thyroid, unspecified: Secondary | ICD-10-CM | POA: Diagnosis not present

## 2014-03-07 DIAGNOSIS — Z9851 Tubal ligation status: Secondary | ICD-10-CM | POA: Insufficient documentation

## 2014-03-07 DIAGNOSIS — Z880 Allergy status to penicillin: Secondary | ICD-10-CM | POA: Insufficient documentation

## 2014-03-07 DIAGNOSIS — Z90710 Acquired absence of both cervix and uterus: Secondary | ICD-10-CM | POA: Insufficient documentation

## 2014-03-07 DIAGNOSIS — Z8719 Personal history of other diseases of the digestive system: Secondary | ICD-10-CM | POA: Diagnosis not present

## 2014-03-07 DIAGNOSIS — K567 Ileus, unspecified: Secondary | ICD-10-CM

## 2014-03-07 DIAGNOSIS — Z8742 Personal history of other diseases of the female genital tract: Secondary | ICD-10-CM | POA: Insufficient documentation

## 2014-03-07 DIAGNOSIS — I10 Essential (primary) hypertension: Secondary | ICD-10-CM | POA: Diagnosis not present

## 2014-03-07 DIAGNOSIS — J45909 Unspecified asthma, uncomplicated: Secondary | ICD-10-CM | POA: Diagnosis not present

## 2014-03-07 DIAGNOSIS — Z8542 Personal history of malignant neoplasm of other parts of uterus: Secondary | ICD-10-CM | POA: Insufficient documentation

## 2014-03-07 DIAGNOSIS — Z923 Personal history of irradiation: Secondary | ICD-10-CM | POA: Insufficient documentation

## 2014-03-07 DIAGNOSIS — Z79899 Other long term (current) drug therapy: Secondary | ICD-10-CM | POA: Insufficient documentation

## 2014-03-07 DIAGNOSIS — E785 Hyperlipidemia, unspecified: Secondary | ICD-10-CM | POA: Diagnosis not present

## 2014-03-07 DIAGNOSIS — Z8541 Personal history of malignant neoplasm of cervix uteri: Secondary | ICD-10-CM | POA: Diagnosis not present

## 2014-03-07 LAB — CBC WITH DIFFERENTIAL/PLATELET
Basophils Absolute: 0 10*3/uL (ref 0.0–0.1)
Basophils Relative: 0 % (ref 0–1)
Eosinophils Absolute: 0 10*3/uL (ref 0.0–0.7)
Eosinophils Relative: 0 % (ref 0–5)
HCT: 40.2 % (ref 36.0–46.0)
Hemoglobin: 13.3 g/dL (ref 12.0–15.0)
Lymphocytes Relative: 7 % — ABNORMAL LOW (ref 12–46)
Lymphs Abs: 0.8 10*3/uL (ref 0.7–4.0)
MCH: 22.2 pg — ABNORMAL LOW (ref 26.0–34.0)
MCHC: 33.1 g/dL (ref 30.0–36.0)
MCV: 67.1 fL — ABNORMAL LOW (ref 78.0–100.0)
Monocytes Absolute: 0.5 10*3/uL (ref 0.1–1.0)
Monocytes Relative: 4 % (ref 3–12)
Neutro Abs: 10.5 10*3/uL — ABNORMAL HIGH (ref 1.7–7.7)
Neutrophils Relative %: 89 % — ABNORMAL HIGH (ref 43–77)
Platelets: 222 10*3/uL (ref 150–400)
RBC: 5.99 MIL/uL — ABNORMAL HIGH (ref 3.87–5.11)
RDW: 14.2 % (ref 11.5–15.5)
WBC: 11.8 10*3/uL — ABNORMAL HIGH (ref 4.0–10.5)

## 2014-03-07 LAB — COMPREHENSIVE METABOLIC PANEL
ALT: 24 U/L (ref 0–35)
AST: 31 U/L (ref 0–37)
Albumin: 4.4 g/dL (ref 3.5–5.2)
Alkaline Phosphatase: 80 U/L (ref 39–117)
Anion gap: 12 (ref 5–15)
BUN: 18 mg/dL (ref 6–23)
CO2: 26 mmol/L (ref 19–32)
Calcium: 9.7 mg/dL (ref 8.4–10.5)
Chloride: 95 mmol/L — ABNORMAL LOW (ref 96–112)
Creatinine, Ser: 0.79 mg/dL (ref 0.50–1.10)
GFR calc Af Amer: >90 mL/min (ref >90)
GFR calc non Af Amer: 83 mL/min — ABNORMAL LOW (ref >90)
Glucose, Bld: 149 mg/dL — ABNORMAL HIGH (ref 70–99)
Potassium: 3.3 mmol/L — ABNORMAL LOW (ref 3.5–5.1)
Sodium: 133 mmol/L — ABNORMAL LOW (ref 135–145)
Total Bilirubin: 0.9 mg/dL (ref 0.3–1.2)
Total Protein: 6.8 g/dL (ref 6.0–8.3)

## 2014-03-07 LAB — CBG MONITORING, ED: Glucose-Capillary: 160 mg/dL — ABNORMAL HIGH (ref 70–99)

## 2014-03-07 LAB — I-STAT CG4 LACTIC ACID, ED: Lactic Acid, Venous: 1.65 mmol/L (ref 0.5–2.0)

## 2014-03-07 LAB — URINALYSIS, ROUTINE W REFLEX MICROSCOPIC
Bilirubin Urine: NEGATIVE
Glucose, UA: NEGATIVE mg/dL
Hgb urine dipstick: NEGATIVE
Ketones, ur: NEGATIVE mg/dL
Nitrite: NEGATIVE
Protein, ur: NEGATIVE mg/dL
Specific Gravity, Urine: 1.014 (ref 1.005–1.030)
Urobilinogen, UA: 0.2 mg/dL (ref 0.0–1.0)
pH: 8 (ref 5.0–8.0)

## 2014-03-07 LAB — URINE MICROSCOPIC-ADD ON

## 2014-03-07 LAB — LIPASE, BLOOD: Lipase: 36 U/L (ref 11–59)

## 2014-03-07 MED ORDER — IOHEXOL 300 MG/ML  SOLN: 50.0000 mL | Freq: Once | INTRAMUSCULAR | Status: DC | PRN

## 2014-03-07 MED ORDER — METOCLOPRAMIDE HCL 5 MG/ML IJ SOLN
10.0000 mg | INTRAMUSCULAR | Status: AC
  Administered 2014-03-07: 10 mg via INTRAVENOUS
  Filled 2014-03-07: qty 2

## 2014-03-07 MED ORDER — SODIUM CHLORIDE 0.9 % IV BOLUS (SEPSIS)
1000.0000 mL | Freq: Once | INTRAVENOUS | Status: AC
  Administered 2014-03-07: 1000 mL via INTRAVENOUS

## 2014-03-07 MED ORDER — FENTANYL CITRATE 0.05 MG/ML IJ SOLN
50.0000 ug | Freq: Once | INTRAMUSCULAR | Status: DC
  Filled 2014-03-07: qty 2

## 2014-03-07 NOTE — ED Notes (Signed)
Per EMS, pt from home, reports abd pain with n/v/d since 2030 last night.

## 2014-03-07 NOTE — ED Notes (Signed)
Pt reports abd pain with n/v/d since 2030 last night.  Pt reports 2 weeks ago, having the same sxs with resolved.  Pt also states having same sxs again in October while in [place].  States normally she just wait it out and sometimes sxs gets better.  Pt reports taking SL Zofran at home and vomited it.  Per EMS, pt received 4mg  of zofran IVP en route.

## 2014-03-07 NOTE — Discharge Instructions (Signed)
We saw you in the ER for the abdominal pain. All of our results are normal, including all labs and imaging. Kidney function is fine as well. We are not sure what is causing your abdominal pain, and recommend that you see your GI doctor within a week for further evaluation. If your symptoms get worse, return to the ER.   Abdominal Pain, Women Abdominal (stomach, pelvic, or belly) pain can be caused by many things. It is important to tell your doctor:  The location of the pain.  Does it come and go or is it present all the time?  Are there things that start the pain (eating certain foods, exercise)?  Are there other symptoms associated with the pain (fever, nausea, vomiting, diarrhea)? All of this is helpful to know when trying to find the cause of the pain. CAUSES   Stomach: virus or bacteria infection, or ulcer.  Intestine: appendicitis (inflamed appendix), regional ileitis (Crohn's disease), ulcerative colitis (inflamed colon), irritable bowel syndrome, diverticulitis (inflamed diverticulum of the colon), or cancer of the stomach or intestine.  Gallbladder disease or stones in the gallbladder.  Kidney disease, kidney stones, or infection.  Pancreas infection or cancer.  Fibromyalgia (pain disorder).  Diseases of the female organs:  Uterus: fibroid (non-cancerous) tumors or infection.  Fallopian tubes: infection or tubal pregnancy.  Ovary: cysts or tumors.  Pelvic adhesions (scar tissue).  Endometriosis (uterus lining tissue growing in the pelvis and on the pelvic organs).  Pelvic congestion syndrome (female organs filling up with blood just before the menstrual period).  Pain with the menstrual period.  Pain with ovulation (producing an egg).  Pain with an IUD (intrauterine device, birth control) in the uterus.  Cancer of the female organs.  Functional pain (pain not caused by a disease, may improve without treatment).  Psychological  pain.  Depression. DIAGNOSIS  Your doctor will decide the seriousness of your pain by doing an examination.  Blood tests.  X-rays.  Ultrasound.  CT scan (computed tomography, special type of X-ray).  MRI (magnetic resonance imaging).  Cultures, for infection.  Barium enema (dye inserted in the large intestine, to better view it with X-rays).  Colonoscopy (looking in intestine with a lighted tube).  Laparoscopy (minor surgery, looking in abdomen with a lighted tube).  Major abdominal exploratory surgery (looking in abdomen with a large incision). TREATMENT  The treatment will depend on the cause of the pain.   Many cases can be observed and treated at home.  Over-the-counter medicines recommended by your caregiver.  Prescription medicine.  Antibiotics, for infection.  Birth control pills, for painful periods or for ovulation pain.  Hormone treatment, for endometriosis.  Nerve blocking injections.  Physical therapy.  Antidepressants.  Counseling with a psychologist or psychiatrist.  Minor or major surgery. HOME CARE INSTRUCTIONS   Do not take laxatives, unless directed by your caregiver.  Take over-the-counter pain medicine only if ordered by your caregiver. Do not take aspirin because it can cause an upset stomach or bleeding.  Try a clear liquid diet (broth or water) as ordered by your caregiver. Slowly move to a bland diet, as tolerated, if the pain is related to the stomach or intestine.  Have a thermometer and take your temperature several times a day, and record it.  Bed rest and sleep, if it helps the pain.  Avoid sexual intercourse, if it causes pain.  Avoid stressful situations.  Keep your follow-up appointments and tests, as your caregiver orders.  If the pain does  not go away with medicine or surgery, you may try:  Acupuncture.  Relaxation exercises (yoga, meditation).  Group therapy.  Counseling. SEEK MEDICAL CARE IF:   You  notice certain foods cause stomach pain.  Your home care treatment is not helping your pain.  You need stronger pain medicine.  You want your IUD removed.  You feel faint or lightheaded.  You develop nausea and vomiting.  You develop a rash.  You are having side effects or an allergy to your medicine. SEEK IMMEDIATE MEDICAL CARE IF:   Your pain does not go away or gets worse.  You have a fever.  Your pain is felt only in portions of the abdomen. The right side could possibly be appendicitis. The left lower portion of the abdomen could be colitis or diverticulitis.  You are passing blood in your stools (bright red or black tarry stools, with or without vomiting).  You have blood in your urine.  You develop chills, with or without a fever.  You pass out. MAKE SURE YOU:   Understand these instructions.  Will watch your condition.  Will get help right away if you are not doing well or get worse. Document Released: 11/24/2006 Document Revised: 06/13/2013 Document Reviewed: 12/14/2008 [facility] Patient Information 2015 [place], [place]. This information is not intended to replace advice given to you by your health care provider. Make sure you discuss any questions you have with your health care provider. Soft-Food Meal Plan A soft-food meal plan includes foods that are safe and easy to swallow. This meal plan typically is used:  If you are having trouble chewing or swallowing foods.  As a transition meal plan after only having had liquid meals for a long period. WHAT DO I NEED TO KNOW ABOUT THE SOFT-FOOD MEAL PLAN? A soft-food meal plan includes tender foods that are soft and easy to chew and swallow. In most cases, bite-sized pieces of food are easier to swallow. A bite-sized piece is about  inch or smaller. Foods in this plan do not need to be ground or pureed. Foods that are very hard, crunchy, or sticky should be avoided. Also, breads, cereals, yogurts, and desserts with  nuts, seeds, or fruits should be avoided. WHAT FOODS CAN I EAT? Grains Rice and wild rice. Moist bread, dressing, pasta, and noodles. Well-moistened dry or cooked cereals, such as farina (cooked wheat cereal), oatmeal, or grits. Biscuits, breads, muffins, pancakes, and waffles that have been well moistened. Vegetables Shredded lettuce. Cooked, tender vegetables, including potatoes without skins. Vegetable juices. Broths or creamed soups made with vegetables that are not stringy or chewy. Strained tomatoes (without seeds). Fruits Canned or well-cooked fruits. Soft (ripe), peeled fresh fruits, such as peaches, nectarines, kiwi, cantaloupe, honeydew melon, and watermelon (without seeds). Soft berries with small seeds, such as strawberries. Fruit juices (without pulp). Meats and Other Protein Sources Moist, tender, lean beef. Mutton. Lamb. Veal. Chicken. [place]. Liver. Ham. Fish without bones. Eggs. Dairy Milk, milk drinks, and cream. Plain cream cheese and cottage cheese. Plain yogurt. Sweets/Desserts Flavored gelatin desserts. Custard. Plain ice cream, frozen yogurt, sherbet, milk shakes, and malts. Plain cakes and cookies. Plain hard candy.  Other Butter, margarine (without trans fat), and cooking oils. Mayonnaise. Cream sauces. Mild spices, salt, and sugar. Syrup, molasses, honey, and jelly. The items listed above may not be a complete list of recommended foods or beverages. Contact your dietitian for more options. WHAT FOODS ARE NOT RECOMMENDED? Grains Dry bread, toast, crackers that have not been moistened.  Coarse or dry cereals, such as bran, granola, and shredded wheat. Tough or chewy crusty breads, such as [place] bread or baguettes. Vegetables Corn. Raw vegetables except shredded lettuce. Cooked vegetables that are tough or stringy. Tough, crisp, fried potatoes and potato skins. Fruits Fresh fruits with skins or seeds or both, such as apples, pears, or grapes. Stringy, high-pulp fruits,  such as papaya, pineapple, coconut, or mango. Fruit leather, fruit roll-ups, and all dried fruits. Meats and Other Protein Sources Sausages and hot dogs. Meats with gristle. Fish with bones. Nuts, seeds, and chunky peanut or other nut butters. Sweets/Desserts Cakes or cookies that are very dry or chewy.  The items listed above may not be a complete list of foods and beverages to avoid. Contact your dietitian for more information. Document Released: 05/06/2007 Document Revised: 02/01/2013 Document Reviewed: 12/24/2012 [facility] Patient Information 2015 [place], [place]. This information is not intended to replace advice given to you by your health care provider. Make sure you discuss any questions you have with your health care provider.

## 2014-03-11 NOTE — ED Provider Notes (Signed)
CSN: [id number]     Arrival date & time 03/07/14  0150 History   First MD Initiated Contact with Patient 03/07/14 [phone]     Chief Complaint  Patient presents with  . Abdominal Pain     (Consider location/radiation/quality/duration/timing/severity/associated sxs/prior Treatment) HPI Comments: Pt with hx of cervical CA s/p hysterectomy and radiation therapy, and several partial SBO -thought to be due to radiation fibrosis and strictures, comes in with cc of abd pain and nausea, with emesis. Pt has had these type of symptoms off and on for months - and likely 20+ times in the lat year. She reports that most of the time the sx resolve on their own, but occasionally, they dont - requiring her to come to the ER for pain relief. Pt currently has pain diffusely, and it is mild. Earlier the pain was severe. Pain is non radiating and there is associated nausea and emesis. Pt has had several loose BM as well. There is no fevers, no uti like sx.  Patient is a 69 y.o. female presenting with abdominal pain. The history is provided by the patient and medical records.  Abdominal Pain Associated symptoms: nausea and vomiting   Associated symptoms: no chest pain, no constipation, no cough, no diarrhea, no hematuria and no shortness of breath     Past Medical History  Diagnosis Date  . Asthma   . Hypertension   . Thyroid disease   . Cervical cancer 1997    poorly differentiated cervical carcinoma. S/p TAH and radiation x6 wks   . Endometrial cancer 1997    coincidental endometrial cancer also  . Hyperlipemia   . 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 Vertebral body hemangioma     T12  . Small bowel obstruction     12/2008, repeat SBO in 02/2011 also with terminal ilietis   . Complication of anesthesia     took 2 days to wake up after 1 surgery   Past Surgical History  Procedure Laterality Date  . Back surgery    . Tubal ligation    . Skin biopsy    . Abdominal  hysterectomy  Feb 1997    TAH/BSO, pelvic and periaortic lymphadenectomy   . Partial nephrectomy      right   Family History  Problem Relation Age of Onset  . Hypertension Other   . Diabetes Other   . Stroke Other   . CAD Other    History  Substance Use Topics  . Smoking status: Never Smoker   . Smokeless tobacco: Never Used  . Alcohol Use: No   OB History    No data available     Review of Systems  Constitutional: Negative for activity change.  HENT: Negative for facial swelling.   Respiratory: Negative for cough, shortness of breath and wheezing.   Cardiovascular: Negative for chest pain.  Gastrointestinal: Positive for nausea, vomiting and abdominal pain. Negative for diarrhea, constipation, blood in stool and abdominal distention.  Genitourinary: Negative for hematuria and difficulty urinating.  Musculoskeletal: Negative for neck pain.  Skin: Negative for color change.  Neurological: Negative for speech difficulty.  Hematological: Does not bruise/bleed easily.  Psychiatric/Behavioral: Negative for confusion.  All other systems reviewed and are negative.     Allergies  Codeine; Prochlorperazine edisylate; and Penicillins  Home Medications   Prior to Admission medications   Medication Sig Start Date End Date Taking? Authorizing Provider  acidophilus (RISAQUAD) CAPS Take 1 capsule by  mouth daily.   Yes Historical Provider, MD  benazepril-hydrochlorthiazide (LOTENSIN HCT) 20-12.5 MG per tablet Take 1 tablet by mouth daily.   Yes Historical Provider, MD  calcium carbonate (OS-CAL - DOSED IN MG OF ELEMENTAL CALCIUM) 1250 (500 CA) MG tablet Take 1 tablet by mouth.   Yes Historical Provider, MD  cholecalciferol (VITAMIN D) 1000 UNITS tablet Take 1,000 Units by mouth 2 (two) times daily.    Yes Historical Provider, MD  levothyroxine (SYNTHROID, LEVOTHROID) 75 MCG tablet Take 75 mcg by mouth daily.   Yes Historical Provider, MD  magnesium gluconate (MAGONATE) 500 MG  tablet Take 500 mg by mouth every morning.    Yes Historical Provider, MD  Multiple Vitamins-Minerals (MULTIVITAMIN WITH MINERALS) tablet Take 1 tablet by mouth daily.   Yes Historical Provider, MD  omega-3 acid ethyl esters (LOVAZA) 1 G capsule Take 1 g by mouth 2 (two) times daily.   Yes Historical Provider, MD  ondansetron (ZOFRAN ODT) 8 MG disintegrating tablet Take 1 tablet (8 mg total) by mouth every 8 (eight) hours as needed for nausea or vomiting. 02/06/13  Yes Hoy Morn, MD  simvastatin (ZOCOR) 20 MG tablet Take 20 mg by mouth every evening.   Yes Historical Provider, MD  vitamin C (ASCORBIC ACID) 500 MG tablet Take 500 mg by mouth daily.   Yes Historical Provider, MD  dicyclomine (BENTYL) 20 MG tablet Take 1 tablet (20 mg total) by mouth every 6 (six) hours as needed for spasms (for abdominal cramping). Patient not taking: Reported on 03/07/2014 04/15/13   Kalman Drape, MD   BP 132/76 mmHg  Pulse 73  Temp(Src) 98.2 F (36.8 C) (Oral)  Resp 14  SpO2 100% Physical Exam  Constitutional: She is oriented to person, place, and time. She appears well-developed and well-nourished.  HENT:  Head: Normocephalic and atraumatic.  Eyes: EOM are normal. Pupils are equal, round, and reactive to light.  Neck: Neck supple.  Cardiovascular: Normal rate, regular rhythm and normal heart sounds.   No murmur heard. Pulmonary/Chest: Effort normal. No respiratory distress.  Abdominal: Soft. She exhibits no distension. There is no tenderness. There is no rebound and no guarding.  Neurological: She is alert and oriented to person, place, and time.  Skin: Skin is warm and dry.  Nursing note and vitals reviewed.   ED Course  Procedures (including critical care time) Labs Review Labs Reviewed  CBC WITH DIFFERENTIAL/PLATELET - Abnormal; Notable for the following:    WBC 11.8 (*)    RBC 5.99 (*)    MCV 67.1 (*)    MCH 22.2 (*)    Neutrophils Relative % 89 (*)    Lymphocytes Relative 7 (*)     Neutro Abs 10.5 (*)    All other components within normal limits  COMPREHENSIVE METABOLIC PANEL - Abnormal; Notable for the following:    Sodium 133 (*)    Potassium 3.3 (*)    Chloride 95 (*)    Glucose, Bld 149 (*)    GFR calc non Af Amer 83 (*)    All other components within normal limits  URINALYSIS, ROUTINE W REFLEX MICROSCOPIC - Abnormal; Notable for the following:    APPearance CLOUDY (*)    Leukocytes, UA TRACE (*)    All other components within normal limits  CBG MONITORING, ED - Abnormal; Notable for the following:    Glucose-Capillary 160 (*)    All other components within normal limits  LIPASE, BLOOD  URINE MICROSCOPIC-ADD ON  I-STAT  CG4 LACTIC ACID, ED    Imaging Review No results found.   EKG Interpretation None     Final result by Rad Results In Interface (03/07/14 05:48:45)   Narrative:   CLINICAL DATA: Upper abdominal pain and nausea beginning March 06, 2014. History of bowel obstructions and cancer.  EXAM: ACUTE ABDOMEN SERIES (ABDOMEN 2 VIEW & CHEST 1 VIEW)  COMPARISON: Acute abdominal series April 14, 2013  FINDINGS: Cardiomediastinal silhouette is unremarkable. Lungs are clear, no pleural effusions. No pneumothorax. Soft tissue planes and included osseous structures are nonacute; scoliosis.  Bowel gas pattern is nondilated and nonobstructive though, relative paucity of bowel gas. Extensive surgical clips throughout the lower abdomen and pelvis consistent with nodal dissection. No intra-abdominal mass effect, pathologic calcifications or free air. Soft tissue planes and included osseous structures are nonsuspicious. Bilateral femoral heads spurring.  IMPRESSION: No acute cardiopulmonary process.  Nonspecific bowel gas pattern though, relative overall paucity of bowel gas.   Electronically Signed By: Elon Alas On: 03/07/2014 05:48    MDM   Final diagnoses:  Generalized abdominal pain  Ileus    Pt comes in w/ abd  pain. Hx of partial obstruction and ileus due to radiation effects from her cancer. She reports abd pain with n/v last night. Pain is better now. Pt has never had sx consistent with SBO, AAS is neg.  Pt reassessed, and feels better. Will d.c.  Varney Biles, MD 03/11/14 [phone]

## 2014-03-17 ENCOUNTER — Ambulatory Visit: Payer: Medicare Other | Attending: Gynecology | Admitting: Gynecology

## 2014-03-17 ENCOUNTER — Encounter: Payer: Self-pay | Admitting: Gynecology

## 2014-03-17 VITALS — BP 156/76 | HR 66 | Temp 98.4°F | Resp 18 | Ht 65.5 in | Wt 155.8 lb

## 2014-03-17 DIAGNOSIS — Z8719 Personal history of other diseases of the digestive system: Secondary | ICD-10-CM | POA: Insufficient documentation

## 2014-03-17 DIAGNOSIS — Z90710 Acquired absence of both cervix and uterus: Secondary | ICD-10-CM | POA: Diagnosis not present

## 2014-03-17 DIAGNOSIS — Z905 Acquired absence of kidney: Secondary | ICD-10-CM | POA: Insufficient documentation

## 2014-03-17 DIAGNOSIS — I10 Essential (primary) hypertension: Secondary | ICD-10-CM | POA: Insufficient documentation

## 2014-03-17 DIAGNOSIS — E079 Disorder of thyroid, unspecified: Secondary | ICD-10-CM | POA: Diagnosis not present

## 2014-03-17 DIAGNOSIS — K56600 Partial intestinal obstruction, unspecified as to cause: Secondary | ICD-10-CM

## 2014-03-17 DIAGNOSIS — Z8541 Personal history of malignant neoplasm of cervix uteri: Secondary | ICD-10-CM | POA: Diagnosis not present

## 2014-03-17 DIAGNOSIS — N904 Leukoplakia of vulva: Secondary | ICD-10-CM

## 2014-03-17 DIAGNOSIS — C539 Malignant neoplasm of cervix uteri, unspecified: Secondary | ICD-10-CM

## 2014-03-17 DIAGNOSIS — Z08 Encounter for follow-up examination after completed treatment for malignant neoplasm: Secondary | ICD-10-CM | POA: Diagnosis not present

## 2014-03-17 DIAGNOSIS — K5669 Other intestinal obstruction: Secondary | ICD-10-CM

## 2014-03-17 DIAGNOSIS — Z79891 Long term (current) use of opiate analgesic: Secondary | ICD-10-CM | POA: Insufficient documentation

## 2014-03-17 DIAGNOSIS — Z8542 Personal history of malignant neoplasm of other parts of uterus: Secondary | ICD-10-CM | POA: Insufficient documentation

## 2014-03-17 DIAGNOSIS — Z923 Personal history of irradiation: Secondary | ICD-10-CM | POA: Diagnosis not present

## 2014-03-17 DIAGNOSIS — E785 Hyperlipidemia, unspecified: Secondary | ICD-10-CM | POA: Insufficient documentation

## 2014-03-17 DIAGNOSIS — Z90722 Acquired absence of ovaries, bilateral: Secondary | ICD-10-CM | POA: Diagnosis not present

## 2014-03-17 NOTE — Patient Instructions (Signed)
Plan to follow up in one year or sooner if needed.  Please call closer to the date to schedule.  Please call for any questions or concerns.  Low-Fiber Diet Fiber is found in fruits, vegetables, and whole grains. A low-fiber diet restricts fibrous foods that are not digested in the small intestine. A diet containing about 10-15 grams of fiber per day is considered low fiber. Low-fiber diets may be used to:  Promote healing and rest the bowel during intestinal flare-ups.  Prevent blockage of a partially obstructed or narrowed gastrointestinal tract.  Reduce fecal weight and volume.  Slow the movement of feces. You may be on a low-fiber diet as a transitional diet following surgery, after an injury (trauma), or because of a short (acute) or lifelong (chronic) illness. Your health care provider will determine the length of time you need to stay on this diet.  WHAT DO I NEED TO KNOW ABOUT A LOW-FIBER DIET? Always check the fiber content on the packaging's Nutrition Facts label, especially on foods from the grains list. Ask your dietitian if you have questions about specific foods that are related to your condition, especially if the food is not listed below. In general, a low-fiber food will have less than 2 g of fiber. WHAT FOODS CAN I EAT? Grains All breads and crackers made with white flour. Sweet rolls, doughnuts, waffles, pancakes, [place] toast, bagels. Pretzels, Melba toast, zwieback. Well-cooked cereals, such as cornmeal, farina, or cream cereals. Dry cereals that do not contain whole grains, fruit, or nuts, such as refined corn, wheat, rice, and oat cereals. Potatoes prepared any way without skins, plain pastas and noodles, refined white rice. Use white flour for baking and making sauces. Use allowed list of grains for casseroles, dumplings, and puddings.  Vegetables Strained tomato and vegetable juices. Fresh lettuce, cucumber, spinach. Well-cooked (no skin or pulp) or canned vegetables, such  as asparagus, bean sprouts, beets, carrots, green beans, mushrooms, potatoes, pumpkin, spinach, yellow squash, tomato sauce/puree, turnips, yams, and zucchini. Keep servings limited to  cup.  Fruits All fruit juices except prune juice. Cooked or canned fruits without skin and seeds, such as applesauce, apricots, cherries, fruit cocktail, grapefruit, grapes, mandarin oranges, melons, peaches, pears, pineapple, and plums. Fresh fruits without skin, such as apricots, avocados, bananas, melons, pineapple, nectarines, and peaches. Keep servings limited to  cup or 1 piece.  Meat and Other Protein Sources Ground or well-cooked tender beef, ham, veal, lamb, pork, or poultry. Eggs, plain cheese. Fish, oysters, shrimp, lobster, and other seafood. Liver, organ meats. Smooth nut butters. Dairy All milk products and alternative dairy substitutes, such as soy, rice, almond, and coconut, not containing added whole nuts, seeds, or added fruit. Beverages Decaf coffee, fruit, and vegetable juices or smoothies (small amounts, with no pulp or skins, and with fruits from allowed list), sports drinks, herbal tea. Condiments Ketchup, mustard, vinegar, cream sauce, cheese sauce, cocoa powder. Spices in moderation, such as allspice, basil, bay leaves, celery powder or leaves, cinnamon, cumin powder, curry powder, ginger, mace, marjoram, onion or garlic powder, oregano, paprika, parsley flakes, ground pepper, rosemary, sage, savory, tarragon, thyme, and turmeric. Sweets and Desserts Plain cakes and cookies, pie made with allowed fruit, pudding, custard, cream pie. Gelatin, fruit, ice, sherbet, frozen ice pops. Ice cream, ice milk without nuts. Plain hard candy, honey, jelly, molasses, syrup, sugar, chocolate syrup, gumdrops, marshmallows. Limit overall sugar intake.  Fats and Oil Margarine, butter, cream, mayonnaise, salad oils, plain salad dressings made from allowed foods. Choose  healthy fats such as olive oil, canola oil,  and omega-3 fatty acids (such as found in salmon or tuna) when possible.  Other Bouillon, broth, or cream soups made from allowed foods. Any strained soup. Casseroles or mixed dishes made with allowed foods. The items listed above may not be a complete list of recommended foods or beverages. Contact your dietitian for more options.  WHAT FOODS ARE NOT RECOMMENDED? Grains All whole wheat and whole grain breads and crackers. Multigrains, rye, bran seeds, nuts, or coconut. Cereals containing whole grains, multigrains, bran, coconut, nuts, raisins. Cooked or dry oatmeal, steel-cut oats. Coarse wheat cereals, granola. Cereals advertised as high fiber. Potato skins. Whole grain pasta, wild or brown rice. Popcorn. Coconut flour. Bran, buckwheat, corn bread, multigrains, rye, wheat germ.  Vegetables Fresh, cooked or canned vegetables, such as artichokes, asparagus, beet greens, broccoli, Brussels sprouts, cabbage, celery, cauliflower, corn, eggplant, kale, legumes or beans, okra, peas, and tomatoes. Avoid large servings of any vegetables, especially raw vegetables.  Fruits Fresh fruits, such as apples with or without skin, berries, cherries, figs, grapes, grapefruit, guavas, kiwis, mangoes, oranges, papayas, pears, persimmons, pineapple, and pomegranate. Prune juice and juices with pulp, stewed or dried prunes. Dried fruits, dates, raisins. Fruit seeds or skins. Avoid large servings of all fresh fruits. Meats and Other Protein Sources Tough, fibrous meats with gristle. Chunky nut butter. Cheese made with seeds, nuts, or other foods not recommended. Nuts, seeds, legumes (beans, including baked beans), dried peas, beans, lentils.  Dairy Yogurt or cheese that contains nuts, seeds, or added fruit.  Beverages Fruit juices with high pulp, prune juice. Caffeinated coffee and teas.  Condiments Coconut, maple syrup, pickles, olives. Sweets and Desserts Desserts, cookies, or candies that contain nuts or coconut,  chunky peanut butter, dried fruits. Jams, preserves with seeds, marmalade. Large amounts of sugar and sweets. Any other dessert made with fruits from the not recommended list.  Other Soups made from vegetables that are not recommended or that contain other foods not recommended.  The items listed above may not be a complete list of foods and beverages to avoid. Contact your dietitian for more information. Document Released: 07/19/2001 Document Revised: 02/01/2013 Document Reviewed: 12/20/2012 [facility] Patient Information 2015 [place], [place]. This information is not intended to replace advice given to you by your health care provider. Make sure you discuss any questions you have with your health care provider.

## 2014-03-17 NOTE — Progress Notes (Signed)
Consult Note: Gyn-Onc   [name] Richards 69 y.o. female  Chief Complaint  Patient presents with  . Cervical Cancer    Assessment : Poorly differentiated endometrial and cervical cancer 1997. No evidence of disease.  Intermittent partial small bowel obstruction. Lichen sclerosis of the vulva (stable)  Plan: The patient  will continue to have a low fiber diet. She's encouraged to contact me if she has worsening GI symptoms.. Patient return to see [place] in one year. She'll continue to have annual mammograms.  Interval History: The patient returns today as previously scheduled. Overall she's done recently well this year although she did have a severe episode of abdominal pain necessitating emergency room visit on 03/07/2014. The patient had an abdominal x-ray did not show any evidence of obstruction. The severe symptoms (pain score 8 out of 10) resolved after 6-7 hours. The patient does note that when she eats high fiber diet and especially blackberries or skin from tomato or apple she has some GI symptoms suggesting a partial obstruction. However, she is only had 1 episode of this this year. She did note also that she developed a parasite infection in [place].   She reports of lichen sclerosis is asymptomatic. She have mammograms recently that were normal. She has no GU symptoms. Functional status is excellent. She works out 5 days a week.  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 She underwent a total abdominal hysterectomy and bilateral salpingo-oophorectomy, pelvic and para-aortic lymphadenectomy. She received postoperative whole pelvis radiation therapy.  Her other primary gynecologic problem has been lichen sclerosus managed with when necessary clobetasol.  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   Review of Systems:10 point review of systems is negative except as noted in interval history.   Vitals: Blood pressure 156/76, pulse 66, temperature 98.4 F (36.9 C), temperature source Oral, resp. rate 18, height 5' 5.5" (1.664 m), weight 155 lb 12.8 oz (70.67 kg).  Physical Exam: General : The patient is a healthy woman in no acute distress.  HEENT: normocephalic, extraoccular movements normal; neck is supple without thyromegally  Lynphnodes: Supraclavicular and inguinal nodes not enlarged  Breasts: Without masses, skin changes or nipple discharge. There is no axillary adenopathy.  Abdomen: Soft, non-tender, no ascites, no organomegally, no masses, no hernias  Pelvic:  EGBUS: Normal female mild lichen sclerosis.  Vagina: Normal, no lesions , atrophic Urethra and Bladder: Normal, non-tender  Cervix: Surgically absent  Uterus: Surgically absent  Bi-manual examination: Non-tender; no adenxal masses or nodularity  Rectal: normal sphincter tone, no masses, no blood  Lower extremities: No edema or varicosities. Normal range of motion      Allergies  Allergen Reactions  . Codeine     Reporting amnesia  . Prochlorperazine Edisylate     Jaws locked  . Penicillins Hives    Past Medical History  Diagnosis Date  . Asthma   . Hypertension   . Thyroid disease   . Cervical cancer 1997    poorly differentiated cervical carcinoma. S/p TAH and radiation x6 wks   . Endometrial cancer 1997    coincidental endometrial cancer also  . Hyperlipemia   . 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 Vertebral body hemangioma     T12  . Small bowel obstruction     12/2008, repeat SBO  in 02/2011 also with terminal ilietis   . Complication of anesthesia     took 2 days to wake up after 1 surgery    Past Surgical History  Procedure Laterality Date  . Back surgery    . Tubal ligation    . Skin biopsy    . Abdominal hysterectomy  Feb 1997    TAH/BSO,  pelvic and periaortic lymphadenectomy   . Partial nephrectomy      right    Current Outpatient Prescriptions  Medication Sig Dispense Refill  . acidophilus (RISAQUAD) CAPS Take 1 capsule by mouth daily.    . benazepril-hydrochlorthiazide (LOTENSIN HCT) 20-12.5 MG per tablet Take 1 tablet by mouth daily.    . calcium carbonate (OS-CAL - DOSED IN MG OF ELEMENTAL CALCIUM) 1250 (500 CA) MG tablet Take 1 tablet by mouth.    . cholecalciferol (VITAMIN D) 1000 UNITS tablet Take 1,000 Units by mouth 2 (two) times daily.     Marland Kitchen levothyroxine (SYNTHROID, LEVOTHROID) 75 MCG tablet Take 75 mcg by mouth daily.    . magnesium gluconate (MAGONATE) 500 MG tablet Take 500 mg by mouth every morning.     . Multiple Vitamins-Minerals (MULTIVITAMIN WITH MINERALS) tablet Take 1 tablet by mouth daily.    . OIL OF OREGANO PO Take 1 capsule by mouth daily.    Marland Kitchen omega-3 acid ethyl esters (LOVAZA) 1 G capsule Take 1 g by mouth 2 (two) times daily.    . ondansetron (ZOFRAN ODT) 8 MG disintegrating tablet Take 1 tablet (8 mg total) by mouth every 8 (eight) hours as needed for nausea or vomiting. 10 tablet 0  . simvastatin (ZOCOR) 20 MG tablet Take 20 mg by mouth every evening.    . vitamin C (ASCORBIC ACID) 500 MG tablet Take 500 mg by mouth daily.    Marland Kitchen dicyclomine (BENTYL) 20 MG tablet Take 1 tablet (20 mg total) by mouth every 6 (six) hours as needed for spasms (for abdominal cramping). (Patient not taking: Reported on 03/07/2014) 20 tablet 0  . HYDROcodone-acetaminophen (NORCO/VICODIN) 5-325 MG per tablet   0   No current facility-administered medications for this visit.    History   Social History  . Marital Status: Divorced    Spouse Name: N/A    Number of Children: N/A  . Years of Education: N/A   Occupational History  . Not on file.   Social History Main Topics  . Smoking status: Never Smoker   . Smokeless tobacco: Never Used  . Alcohol Use: No  . Drug Use: No  . Sexual Activity: No   Other Topics  Concern  . Not on file   Social History Narrative   Lives at home alone, works out at [employer] 6 days a week, still very active. Has an ex-husband who she is in contact with. Son died of an MI and another son in [place].     Family History  Problem Relation Age of Onset  . Hypertension Other   . Diabetes Other   . Stroke Other   . CAD Other       CLARKE-PEARSON,Canesha Tesfaye L, MD 03/17/2014, 10:52 AM

## 2014-03-23 ENCOUNTER — Telehealth: Payer: Self-pay | Admitting: *Deleted

## 2014-03-23 NOTE — Telephone Encounter (Signed)
Called pt, unable to reach, lmovm Bone density shows osteopenia, follow up with bone scan in 2 years, maintain adequate dietary intake of calcium and vitamin D, weight bearing exercise as tolerated. Pt to call office with any concerns.

## 2015-01-15 ENCOUNTER — Ambulatory Visit: Payer: Medicare Other | Attending: Gynecologic Oncology | Admitting: Gynecologic Oncology

## 2015-01-15 ENCOUNTER — Encounter: Payer: Self-pay | Admitting: Gynecologic Oncology

## 2015-01-15 ENCOUNTER — Other Ambulatory Visit: Payer: Medicare Other

## 2015-01-15 VITALS — BP 169/87 | HR 61 | Temp 98.5°F | Resp 18 | Ht 65.5 in | Wt 154.3 lb

## 2015-01-15 DIAGNOSIS — C539 Malignant neoplasm of cervix uteri, unspecified: Secondary | ICD-10-CM

## 2015-01-15 DIAGNOSIS — R300 Dysuria: Secondary | ICD-10-CM | POA: Insufficient documentation

## 2015-01-15 DIAGNOSIS — K5660 Unspecified intestinal obstruction: Secondary | ICD-10-CM

## 2015-01-15 DIAGNOSIS — N949 Unspecified condition associated with female genital organs and menstrual cycle: Secondary | ICD-10-CM | POA: Insufficient documentation

## 2015-01-15 DIAGNOSIS — N904 Leukoplakia of vulva: Secondary | ICD-10-CM | POA: Diagnosis not present

## 2015-01-15 DIAGNOSIS — Z8541 Personal history of malignant neoplasm of cervix uteri: Secondary | ICD-10-CM

## 2015-01-15 DIAGNOSIS — N9089 Other specified noninflammatory disorders of vulva and perineum: Secondary | ICD-10-CM

## 2015-01-15 MED ORDER — CLOBETASOL PROPIONATE 0.05 % EX OINT: 1.0000 "application " | TOPICAL_OINTMENT | Freq: Two times a day (BID) | CUTANEOUS | Status: DC

## 2015-01-15 MED ORDER — SULFAMETHOXAZOLE-TRIMETHOPRIM 800-160 MG PO TABS: 1.0000 | ORAL_TABLET | Freq: Two times a day (BID) | ORAL | Status: DC

## 2015-01-15 NOTE — Patient Instructions (Signed)
We will call you with the results of your biopsy and urine results.  Please call for any questions or concerns.  If dysplasia is identified, we can discuss surgical options with you.

## 2015-01-15 NOTE — Progress Notes (Signed)
Follow-up Note: Gyn-Onc   [name] Richards 69 y.o. female  Chief Complaint  Patient presents with  . Cervical Cancer    Follow up    Assessment : Poorly differentiated endometrial and cervical cancer 1997.  Intermittent partial small bowel obstruction. Lichen sclerosis of the vulva (stable)  New vulvar mass (at posterior left introitus). 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 Will followup on biopsy. Symptoms of UTI - follow up today's culture. Empiric Rx for bactrim (recent cipro use, may be resistant).  Plan:   1/ vulvar mass - followup bx - will likely require simple vs radical resection. Clobetasol represcribed for recurrent lichen sclerosis. Not to use until bx results back. 2/ UTI - check cx, Rx for bactrim empiric 3/ Hx of recurrent bowel obstructions - The patient  will continue to have a low fiber diet. She's encouraged to contact me if she has worsening GI symptoms.  4/ Hx of uterine/cx cancer- NED.  Interval History: No recurrent symptoms of bowel obsturction since January. She has had a UTI treated with cipro in October. She developed recurrence of symptoms this month and vulvar irritation that is more extreme than usual. No improvement with clobetasol cream.   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 She underwent a total abdominal hysterectomy and bilateral salpingo-oophorectomy, pelvic and para-aortic lymphadenectomy. She received postoperative whole pelvis radiation therapy.  Her other primary gynecologic problem has been lichen sclerosus managed with when necessary clobetasol.  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   Review of Systems:10 point review of systems is negative except as noted in interval history.   Vitals: Blood pressure 169/87, pulse 61,  temperature 98.5 F (36.9 C), temperature source Oral, resp. rate 18, height 5' 5.5" (1.664 m), weight 154 lb 4.8 oz (69.99 kg), SpO2 100 %.  Physical Exam: General : The patient is a healthy woman in no acute distress.  HEENT: normocephalic, extraoccular movements normal; neck is supple without thyromegally  Lynphnodes: Supraclavicular and inguinal nodes not enlarged  Breasts: Without masses, skin changes or nipple discharge. There is no axillary adenopathy.  Abdomen: Soft, non-tender, no ascites, no organomegally, no masses, no hernias  Pelvic:  EGBUS: mild lichen sclerosis of the perivulvar tissues. 1.5cm nodular friable mass on posterior left introitus. Biopsy taken.  Vagina: Normal, no lesions , atrophic Urethra and Bladder: Normal, non-tender  Cervix: Surgically absent  Uterus: Surgically absent  Bi-manual examination: Non-tender; no adenxal masses or nodularity  Rectal: normal sphincter tone, no masses, no blood  Lower extremities: No edema or varicosities. Normal range of motion      Allergies  Allergen Reactions  . Codeine     Reporting amnesia  . Prochlorperazine Edisylate     Jaws locked  . Penicillins Hives    Past Medical History  Diagnosis Date  . Asthma   . Hypertension   . Thyroid disease   . Cervical cancer ([place]) 1997    poorly differentiated cervical carcinoma. S/p TAH and radiation x6 wks   . Endometrial cancer ([place]) 1997    coincidental endometrial cancer also  . Hyperlipemia   . 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 Vertebral body hemangioma     T12  . Small bowel obstruction ([place])     12/2008, repeat SBO in 02/2011 also with terminal ilietis   .  Complication of anesthesia     took 2 days to wake up after 1 surgery    Past Surgical History  Procedure Laterality Date  . Back surgery    . Tubal ligation    . Skin biopsy    . Abdominal hysterectomy  Feb 1997    TAH/BSO, pelvic and periaortic lymphadenectomy   .  Partial nephrectomy      right    Current Outpatient Prescriptions  Medication Sig Dispense Refill  . acidophilus (RISAQUAD) CAPS Take 1 capsule by mouth daily.    . benazepril-hydrochlorthiazide (LOTENSIN HCT) 20-12.5 MG per tablet Take 1 tablet by mouth daily.    . cholecalciferol (VITAMIN D) 1000 UNITS tablet Take 1,000 Units by mouth 2 (two) times daily.     Marland Kitchen levothyroxine (SYNTHROID, LEVOTHROID) 75 MCG tablet Take 75 mcg by mouth daily.    . magnesium gluconate (MAGONATE) 500 MG tablet Take 500 mg by mouth every morning.     . Multiple Vitamins-Minerals (MULTIVITAMIN WITH MINERALS) tablet Take 1 tablet by mouth daily.    . OIL OF OREGANO PO Take 1 capsule by mouth daily.    Marland Kitchen omega-3 acid ethyl esters (LOVAZA) 1 G capsule Take 1 g by mouth 2 (two) times daily.    . ondansetron (ZOFRAN ODT) 8 MG disintegrating tablet Take 1 tablet (8 mg total) by mouth every 8 (eight) hours as needed for nausea or vomiting. 10 tablet 0  . simvastatin (ZOCOR) 20 MG tablet Take 20 mg by mouth every evening.    . vitamin C (ASCORBIC ACID) 500 MG tablet Take 500 mg by mouth daily.    . clobetasol ointment (TEMOVATE) 6.29 % Apply 1 application topically 2 (two) times daily. 30 g 0  . dicyclomine (BENTYL) 20 MG tablet Take 1 tablet (20 mg total) by mouth every 6 (six) hours as needed for spasms (for abdominal cramping). (Patient not taking: Reported on 03/07/2014) 20 tablet 0  . sulfamethoxazole-trimethoprim (BACTRIM DS,SEPTRA DS) 800-160 MG tablet Take 1 tablet by mouth 2 (two) times daily. 14 tablet 0   No current facility-administered medications for this visit.    Social History   Social History  . Marital Status: Divorced    Spouse Name: N/A  . Number of Children: N/A  . Years of Education: N/A   Occupational History  . Not on file.   Social History Main Topics  . Smoking status: Never Smoker   . Smokeless tobacco: Never Used  . Alcohol Use: No  . Drug Use: No  . Sexual Activity: No    Other Topics Concern  . Not on file   Social History Narrative   Lives at home alone, works out at [employer] 6 days a week, still very active. Has an ex-husband who she is in contact with. Son died of an MI and another son in [place].     Family History  Problem Relation Age of Onset  . Hypertension Other   . Diabetes Other   . Stroke Other   . CAD Other     PROCEDURE NOTE The patient provided informed consent for a vulvar biopsy. The skin of the posterior introitus was prepped with Betadine. 1 mL of 1% plain lidocaine was infiltrated into the vulva mass on the posterior left introitus. A 2 mm punch biopsy was taken from this tissue base of the biopsy was made hemostatic with silver nitrate. The patient tolerated procedure well. Specimen sent for permanent pathology as posterior vulva introitus.  Donaciano Eva, MD 01/15/2015, 2:48 PM

## 2015-01-16 ENCOUNTER — Telehealth: Payer: Self-pay | Admitting: Gynecologic Oncology

## 2015-01-16 ENCOUNTER — Ambulatory Visit: Payer: Medicare Other

## 2015-01-16 DIAGNOSIS — R300 Dysuria: Secondary | ICD-10-CM

## 2015-01-16 NOTE — Telephone Encounter (Signed)
Returned call to patient.  Patient had called with concerns about cervical cancer being printed on her AVS under today's visit diagnoses.  Patient advised that the cervical cancer diagnosis is from her diagnosis in 1997.  Advised that we do not know what the biopsy of the vulva is at this time but per Dr. Denman George area may represent dysplasia not vulvar cancer but we will know for sure when the path comes back.  Patient feeling reassured.  Advised to call for any questions or concerns and our office will contact her when the results are available.  She leaves for her cruise on Friday.

## 2015-01-17 LAB — URINE CULTURE

## 2015-01-18 ENCOUNTER — Other Ambulatory Visit: Payer: Self-pay | Admitting: *Deleted

## 2015-01-18 ENCOUNTER — Telehealth: Payer: Self-pay | Admitting: *Deleted

## 2015-01-18 DIAGNOSIS — C519 Malignant neoplasm of vulva, unspecified: Secondary | ICD-10-CM

## 2015-01-18 NOTE — Telephone Encounter (Signed)
Called patient with appointment for PET scan on 01/30/15 with 8:30am arrival at [facility]. Advised patient that she is to having nothing to eat or drink 6 hours prior to scan. Patient verbalized understanding and is agreeable to the appointment as scheduled.

## 2015-01-30 ENCOUNTER — Ambulatory Visit (HOSPITAL_COMMUNITY)
Admission: RE | Admit: 2015-01-30 | Discharge: 2015-01-30 | Disposition: A | Discharge status: Home / Self Care | Payer: Medicare Other | Source: Ambulatory Visit | Attending: Gynecologic Oncology | Admitting: Gynecologic Oncology

## 2015-01-30 DIAGNOSIS — C519 Malignant neoplasm of vulva, unspecified: Secondary | ICD-10-CM | POA: Insufficient documentation

## 2015-01-30 DIAGNOSIS — R911 Solitary pulmonary nodule: Secondary | ICD-10-CM | POA: Diagnosis not present

## 2015-01-30 LAB — GLUCOSE, CAPILLARY: Glucose-Capillary: 101 mg/dL — ABNORMAL HIGH (ref 65–99)

## 2015-01-30 MED ORDER — FLUDEOXYGLUCOSE F - 18 (FDG) INJECTION
7.1800 | Freq: Once | INTRAVENOUS | Status: AC | PRN
  Administered 2015-01-30: 7.18 via INTRAVENOUS

## 2015-02-02 ENCOUNTER — Telehealth: Payer: Self-pay | Admitting: Gynecologic Oncology

## 2015-02-02 NOTE — Telephone Encounter (Signed)
Returned call to Dr. Dema Severin.  She had left a message yesterday wanting to make sure that we are aware that the patient has a lung nodule, increased in size on PET.  Dr. Fermin Schwab contacted and he stated he is aware and plans to have the nodule worked up after surgery for her vulvar cancer.  Message left for Dr. Dema Severin.

## 2015-02-06 HISTORY — PX: RADICAL VULVECTOMY: SHX2286

## 2015-02-23 ENCOUNTER — Ambulatory Visit: Payer: Medicare Other | Attending: Gynecology | Admitting: Gynecology

## 2015-02-23 VITALS — BP 144/78 | HR 63 | Temp 98.4°F | Resp 18 | Wt 153.0 lb

## 2015-02-23 DIAGNOSIS — C539 Malignant neoplasm of cervix uteri, unspecified: Secondary | ICD-10-CM | POA: Insufficient documentation

## 2015-02-23 DIAGNOSIS — Z8542 Personal history of malignant neoplasm of other parts of uterus: Secondary | ICD-10-CM

## 2015-02-23 DIAGNOSIS — C519 Malignant neoplasm of vulva, unspecified: Secondary | ICD-10-CM | POA: Diagnosis not present

## 2015-02-23 DIAGNOSIS — Z8541 Personal history of malignant neoplasm of cervix uteri: Secondary | ICD-10-CM | POA: Diagnosis not present

## 2015-02-23 NOTE — Progress Notes (Signed)
Consult Note: Gyn-Onc   [name] Richards 70 y.o. female  Chief Complaint  Patient presents with  . 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 Satisfactory postoperative recovery.  Past history of Poorly differentiated endometrial and cervical cancer 1997. No evidence of disease.  Intermittent partial small bowel obstruction. Lichen sclerosis of the vulva (stable)  Plan: The patient will continue her current regimen of vulvar hygiene. She's encouraged to walk but not resume her elliptical trainer. She return to see me on 03/23/2015.  Interval History: The patient returns today as previously scheduled. She underwent a modified radical vulvectomy at [facility] on 02/06/2015. She was found to have a 3 cm vulvar lesion of the posterior vulva extending into the posterior vagina. A rhomboid flap was required to close the incision. Patient had on pelvic postoperative course. She reports she's been doing well. She irrigates the wound twice a day with a tepid water and a blow dryer. She denies any drainage or pain or fever. She has been walking approximate half mile a day.  Final pathology showed some close surgical margins. However, the specimen margins were toward the time of surgery and it is my impression that we were widely around the primary cancer. I discussed these findings with the patient and we've agreed to monitor her closely but not recommend any radiation therapy to the vulva. The patient's agreement with this recommendation.  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 She underwent a total abdominal hysterectomy and bilateral salpingo-oophorectomy, pelvic and para-aortic lymphadenectomy. She received postoperative whole pelvis radiation therapy.  Her other primary gynecologic problem has been lichen sclerosus managed with  when necessary clobetasol.  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  In December 2016 the patient was found to have a new primary squamous cell carcinoma the vulva undergoing a modified radical vulvectomy on 02/06/2015.   Review of Systems:10 point review of systems is negative except as noted in interval history.   Vitals: Blood pressure 144/78, pulse 63, temperature 98.4 F (36.9 C), temperature source Oral, resp. rate 18, weight 153 lb (69.4 kg), SpO2 98 %.  Physical Exam: General : The patient is a healthy woman in no acute distress.  HEENT: normocephalic, extraoccular movements normal; neck is supple without thyromegally  Lynphnodes: Supraclavicular and inguinal nodes not enlarged    Abdomen: Soft, non-tender, no ascites, no organomegally, no masses, no hernias  Pelvic:  EGBUS: Normal female mild lichen sclerosis on the anterior vulva. The vulvectomy site is healing well. One absorbable sutures removed.   Lower extremities: No edema or varicosities. Normal range of motion      Allergies  Allergen Reactions  . Codeine     Reporting amnesia  . Prochlorperazine Edisylate     Jaws locked  . Penicillins Hives    Past Medical History  Diagnosis Date  . Asthma   . Hypertension   . Thyroid disease   . Cervical cancer ([place]) 1997    poorly differentiated cervical carcinoma. S/p TAH and radiation x6 wks   . Endometrial cancer ([place]) 1997    coincidental endometrial cancer also  . Hyperlipemia   . 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 Vertebral body hemangioma     T12  .  Small bowel obstruction ([place])     12/2008, repeat SBO in 02/2011 also with terminal ilietis   . Complication of anesthesia     took 2 days to wake up after 1 surgery    Past Surgical History  Procedure Laterality Date  . Back surgery    . Tubal ligation    .  Skin biopsy    . Abdominal hysterectomy  Feb 1997    TAH/BSO, pelvic and periaortic lymphadenectomy   . Partial nephrectomy      right    Current Outpatient Prescriptions  Medication Sig Dispense Refill  . acidophilus (RISAQUAD) CAPS Take 1 capsule by mouth daily.    . benazepril-hydrochlorthiazide (LOTENSIN HCT) 20-12.5 MG per tablet Take 1 tablet by mouth daily.    . cholecalciferol (VITAMIN D) 1000 UNITS tablet Take 1,000 Units by mouth 2 (two) times daily.     . clobetasol ointment (TEMOVATE) 2.59 % Apply 1 application topically 2 (two) times daily. 30 g 0  . dicyclomine (BENTYL) 20 MG tablet Take 1 tablet (20 mg total) by mouth every 6 (six) hours as needed for spasms (for abdominal cramping). 20 tablet 0  . docusate sodium (COLACE) 100 MG capsule Take 100 mg by mouth 2 (two) times daily.    Marland Kitchen levothyroxine (SYNTHROID, LEVOTHROID) 75 MCG tablet Take 75 mcg by mouth daily.    Marland Kitchen lidocaine (XYLOCAINE) 2 % jelly Apply to affected area as needed daily    . magnesium gluconate (MAGONATE) 500 MG tablet Take 500 mg by mouth every morning.     . Multiple Vitamins-Minerals (MULTIVITAMIN WITH MINERALS) tablet Take 1 tablet by mouth daily.    . OIL OF OREGANO PO Take 1 capsule by mouth daily.    Marland Kitchen omega-3 acid ethyl esters (LOVAZA) 1 G capsule Take 1 g by mouth 2 (two) times daily.    . ondansetron (ZOFRAN ODT) 8 MG disintegrating tablet Take 1 tablet (8 mg total) by mouth every 8 (eight) hours as needed for nausea or vomiting. 10 tablet 0  . simvastatin (ZOCOR) 20 MG tablet Take 20 mg by mouth every evening.    . vitamin C (ASCORBIC ACID) 500 MG tablet Take 500 mg by mouth daily.    . Zinc Oxide (DESITIN) 40 % PSTE Apply topically.     No current facility-administered medications for this visit.    Social History   Social History  . Marital Status: Divorced    Spouse Name: N/A  . Number of Children: N/A  . Years of Education: N/A   Occupational History  . Not on file.   Social  History Main Topics  . Smoking status: Never Smoker   . Smokeless tobacco: Never Used  . Alcohol Use: No  . Drug Use: No  . Sexual Activity: No   Other Topics Concern  . Not on file   Social History Narrative   Lives at home alone, works out at [employer] 6 days a week, still very active. Has an ex-husband who she is in contact with. Son died of an MI and another son in [place].     Family History  Problem Relation Age of Onset  . Hypertension Other   . Diabetes Other   . Stroke Other   . CAD Other       CLARKE-PEARSON,Lacharles Altschuler L, MD 02/23/2015, 9:54 AM

## 2015-02-23 NOTE — Patient Instructions (Signed)
Follow up on February 10 ,2017 at 09:15 AM , call with any changes , questions or concerns.  Thank you!

## 2015-03-23 ENCOUNTER — Ambulatory Visit: Payer: Medicare Other | Attending: Gynecology | Admitting: Gynecology

## 2015-03-23 ENCOUNTER — Encounter: Payer: Self-pay | Admitting: Gynecology

## 2015-03-23 VITALS — BP 154/75 | HR 64 | Temp 97.7°F | Resp 18 | Ht 65.5 in | Wt 154.9 lb

## 2015-03-23 DIAGNOSIS — E785 Hyperlipidemia, unspecified: Secondary | ICD-10-CM | POA: Insufficient documentation

## 2015-03-23 DIAGNOSIS — I10 Essential (primary) hypertension: Secondary | ICD-10-CM | POA: Insufficient documentation

## 2015-03-23 DIAGNOSIS — D1771 Benign lipomatous neoplasm of kidney: Secondary | ICD-10-CM | POA: Insufficient documentation

## 2015-03-23 DIAGNOSIS — C519 Malignant neoplasm of vulva, unspecified: Secondary | ICD-10-CM | POA: Diagnosis present

## 2015-03-23 DIAGNOSIS — Z8541 Personal history of malignant neoplasm of cervix uteri: Secondary | ICD-10-CM | POA: Insufficient documentation

## 2015-03-23 DIAGNOSIS — J45909 Unspecified asthma, uncomplicated: Secondary | ICD-10-CM | POA: Diagnosis not present

## 2015-03-23 DIAGNOSIS — Z905 Acquired absence of kidney: Secondary | ICD-10-CM | POA: Insufficient documentation

## 2015-03-23 DIAGNOSIS — D1809 Hemangioma of other sites: Secondary | ICD-10-CM | POA: Diagnosis not present

## 2015-03-23 DIAGNOSIS — Z8542 Personal history of malignant neoplasm of other parts of uterus: Secondary | ICD-10-CM | POA: Insufficient documentation

## 2015-03-23 DIAGNOSIS — E079 Disorder of thyroid, unspecified: Secondary | ICD-10-CM | POA: Diagnosis not present

## 2015-03-23 MED ORDER — SILVER SULFADIAZINE 1 % EX CREA: 1.0000 "application " | TOPICAL_CREAM | Freq: Two times a day (BID) | CUTANEOUS | Status: DC

## 2015-03-23 NOTE — Progress Notes (Signed)
Consult Note: Gyn-Onc   [name] Richards 70 y.o. female  Chief Complaint  Patient presents with  . 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 Satisfactory postoperative recovery. Granulation tissues removed today.  Past history of Poorly differentiated endometrial and cervical cancer 1997. No evidence of disease.  Intermittent partial small bowel obstruction. Lichen sclerosis of the vulva (stable)  Plan: The patient will continue her current regimen of vulvar hygiene. In addition she will add Silvadene to the perineum twice a day.  He may return to normal levels of activity. She return for follow-up visit on 05/04/2015. Interval History: The patient returns today as previously scheduled. Since her last visit she has returned to the increased levels of activity including some exercise on the elliptical trainer at her gym. She denies any bleeding or discharge. She has been using vulvar irrigation twice a day. She does note some "red tissue" in the perineum. She has no other GI or GU symptoms.  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 She underwent a total abdominal hysterectomy and bilateral salpingo-oophorectomy, pelvic and para-aortic lymphadenectomy. She received postoperative whole pelvis radiation therapy.  Her other primary gynecologic problem has been lichen sclerosus managed with when necessary clobetasol.  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  In December 2016 the patient was found to have a new primary squamous cell carcinoma the vulva undergoing a modified radical vulvectomy on 02/06/2015. She underwent a modified radical vulvectomy at [facility] on 02/06/2015. She was found to have a 3 cm vulvar  lesion of the posterior vulva extending into the posterior vagina. A rhomboid flap was required to close the incision. Patient had on pelvic postoperative course. She reports she's been doing well. She irrigates the wound twice a day with a tepid water and a blow dryer. She denies any drainage or pain or fever. She has been walking approximate half mile a day.  Final pathology showed some close surgical margins. However, the specimen margins were toward the time of surgery and it is my impression that we were widely around the primary cancer. I discussed these findings with the patient and we've agreed to monitor her closely but not recommend any radiation therapy to the vulva. The patient's agreement with this recommendation.   Review of Systems:10 point review of systems is negative except as noted in interval history.   Vitals: Blood pressure 154/75, pulse 64, temperature 97.7 F (36.5 C), temperature source Oral, resp. rate 18, height 5' 5.5" (1.664 m), weight 154 lb 14.4 oz (70.262 kg), SpO2 99 %.  Physical Exam: General : The patient is a healthy woman in no acute distress.  HEENT: normocephalic, extraoccular movements normal; neck is supple without thyromegally  Lynphnodes: Supraclavicular and inguinal nodes not enlarged    Abdomen: Soft, non-tender, no ascites, no organomegally, no masses, no hernias  Pelvic:  EGBUS: Normal female mild lichen sclerosis on the anterior vulva. The vulvectomy site is healing well. There is some granulation tissue present which is removed with biopsy forceps and silver nitrates applied.   Lower extremities: No edema or varicosities. Normal range of motion      Allergies  Allergen Reactions  . Codeine     Reporting amnesia  . Prochlorperazine Edisylate     Jaws locked  .  Fentanyl     hypotension  . Penicillins Hives    Past Medical History  Diagnosis Date  . Asthma   . Hypertension   . Thyroid disease   . Cervical cancer ([place]) 1997     poorly differentiated cervical carcinoma. S/p TAH and radiation x6 wks   . Endometrial cancer ([place]) 1997    coincidental endometrial cancer also  . Hyperlipemia   . 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 Vertebral body hemangioma     T12  . Small bowel obstruction ([place])     12/2008, repeat SBO in 02/2011 also with terminal ilietis   . Complication of anesthesia     took 2 days to wake up after 1 surgery    Past Surgical History  Procedure Laterality Date  . Back surgery    . Tubal ligation    . Skin biopsy    . Abdominal hysterectomy  Feb 1997    TAH/BSO, pelvic and periaortic lymphadenectomy   . Partial nephrectomy      right    Current Outpatient Prescriptions  Medication Sig Dispense Refill  . acidophilus (RISAQUAD) CAPS Take 1 capsule by mouth daily.    . benazepril-hydrochlorthiazide (LOTENSIN HCT) 20-12.5 MG per tablet Take 1 tablet by mouth daily.    . cholecalciferol (VITAMIN D) 1000 UNITS tablet Take 1,000 Units by mouth 2 (two) times daily.     . clobetasol ointment (TEMOVATE) 5.80 % Apply 1 application topically 2 (two) times daily. 30 g 0  . dicyclomine (BENTYL) 20 MG tablet Take 1 tablet (20 mg total) by mouth every 6 (six) hours as needed for spasms (for abdominal cramping). 20 tablet 0  . docusate sodium (COLACE) 100 MG capsule Take 100 mg by mouth 2 (two) times daily.    Marland Kitchen levothyroxine (SYNTHROID, LEVOTHROID) 75 MCG tablet Take 75 mcg by mouth daily.    . magnesium gluconate (MAGONATE) 500 MG tablet Take 500 mg by mouth every morning.     . Multiple Vitamins-Minerals (MULTIVITAMIN WITH MINERALS) tablet Take 1 tablet by mouth daily.    . OIL OF OREGANO PO Take 1 capsule by mouth daily.    Marland Kitchen omega-3 acid ethyl esters (LOVAZA) 1 G capsule Take 1 g by mouth 2 (two) times daily.    . ondansetron (ZOFRAN ODT) 8 MG disintegrating tablet Take 1 tablet (8 mg total) by mouth every 8 (eight) hours as needed for nausea or vomiting. 10 tablet 0  .  simvastatin (ZOCOR) 20 MG tablet Take 20 mg by mouth every evening.    . vitamin C (ASCORBIC ACID) 500 MG tablet Take 500 mg by mouth daily.    . Zinc Oxide (DESITIN) 40 % PSTE Apply topically.    . lidocaine (XYLOCAINE) 2 % jelly Reported on 03/23/2015    . LORazepam (ATIVAN) 1 MG tablet     . oxyCODONE-acetaminophen (PERCOCET/ROXICET) 5-325 MG tablet TK 1 T PO Q 4 H PRN P FOR UP TO 7 DAYS  0  . silver sulfADIAZINE (SILVADENE) 1 % cream Apply 1 application topically 2 (two) times daily. To the vulva 50 g 1  . valACYclovir (VALTREX) 1000 MG tablet      No current facility-administered medications for this visit.    Social History   Social History  . Marital Status: Divorced    Spouse Name: N/A  . Number of Children: N/A  . Years of Education: N/A   Occupational History  . Not on  file.   Social History Main Topics  . Smoking status: Never Smoker   . Smokeless tobacco: Never Used  . Alcohol Use: No  . Drug Use: No  . Sexual Activity: No   Other Topics Concern  . Not on file   Social History Narrative   Lives at home alone, works out at [employer] 6 days a week, still very active. Has an ex-husband who she is in contact with. Son died of an MI and another son in [place].     Family History  Problem Relation Age of Onset  . Hypertension Other   . Diabetes Other   . Stroke Other   . CAD Other       CLARKE-PEARSON,Kohl Polinsky L, MD 03/23/2015, 10:29 AM

## 2015-03-23 NOTE — Patient Instructions (Signed)
Apply silvadene cream to the healing area on your vulva twice daily until you see Dr. Fermin Schwab again.  Please call for any questions or concerns.

## 2015-03-30 ENCOUNTER — Inpatient Hospital Stay (HOSPITAL_COMMUNITY)
Admission: EM | Admit: 2015-03-30 | Discharge: 2015-04-04 | DRG: 390 | Disposition: A | Discharge status: Home / Self Care | Payer: Medicare Other | Attending: Internal Medicine | Admitting: Internal Medicine

## 2015-03-30 ENCOUNTER — Emergency Department (HOSPITAL_COMMUNITY): Payer: Medicare Other

## 2015-03-30 ENCOUNTER — Encounter (HOSPITAL_COMMUNITY): Payer: Self-pay | Admitting: Emergency Medicine

## 2015-03-30 DIAGNOSIS — Z8542 Personal history of malignant neoplasm of other parts of uterus: Secondary | ICD-10-CM

## 2015-03-30 DIAGNOSIS — R112 Nausea with vomiting, unspecified: Secondary | ICD-10-CM | POA: Diagnosis not present

## 2015-03-30 DIAGNOSIS — K565 Intestinal adhesions [bands], unspecified as to partial versus complete obstruction: Secondary | ICD-10-CM

## 2015-03-30 DIAGNOSIS — I10 Essential (primary) hypertension: Secondary | ICD-10-CM | POA: Diagnosis present

## 2015-03-30 DIAGNOSIS — Z8541 Personal history of malignant neoplasm of cervix uteri: Secondary | ICD-10-CM

## 2015-03-30 DIAGNOSIS — Z905 Acquired absence of kidney: Secondary | ICD-10-CM

## 2015-03-30 DIAGNOSIS — Y842 Radiological procedure and radiotherapy as the cause of abnormal reaction of the patient, or of later complication, without mention of misadventure at the time of the procedure: Secondary | ICD-10-CM | POA: Diagnosis present

## 2015-03-30 DIAGNOSIS — Z923 Personal history of irradiation: Secondary | ICD-10-CM

## 2015-03-30 DIAGNOSIS — K56609 Unspecified intestinal obstruction, unspecified as to partial versus complete obstruction: Secondary | ICD-10-CM

## 2015-03-30 DIAGNOSIS — E876 Hypokalemia: Secondary | ICD-10-CM | POA: Diagnosis present

## 2015-03-30 DIAGNOSIS — E039 Hypothyroidism, unspecified: Secondary | ICD-10-CM | POA: Diagnosis present

## 2015-03-30 MED ORDER — ONDANSETRON HCL 4 MG/2ML IJ SOLN
4.0000 mg | Freq: Once | INTRAMUSCULAR | Status: AC
  Administered 2015-03-30: 4 mg via INTRAVENOUS
  Filled 2015-03-30: qty 2

## 2015-03-30 NOTE — ED Provider Notes (Signed)
CSN: [id number]     Arrival date & time 03/30/15  2309 History  By signing my name below, I, Randa Evens, attest that this documentation has been prepared under the direction and in the presence of Johnna Bollier, MD. Electronically Signed: Randa Evens, ED Scribe. 03/30/2015. 11:55 PM.     Chief Complaint  Patient presents with  . Emesis   Patient is a 70 y.o. female presenting with vomiting. The history is provided by the patient. No language interpreter was used.  Emesis Severity:  Severe Timing:  Intermittent Quality:  Stomach contents Progression:  Unchanged Chronicity:  Recurrent Recent urination:  Normal Relieved by:  Nothing Worsened by:  Nothing tried Ineffective treatments:  None tried Associated symptoms: abdominal pain, chills and diarrhea   Risk factors: prior abdominal surgery   Risk factors: no alcohol use    HPI Comments: [name] Richards is a 70 y.o. female who presents to the Emergency Department complaining of vomiting onset 7 hours PTA. Pt reports chills, distention, diarrhea x10 and abdominal pain. Pt states that she has tried Zofran with no relief. Pt denies any recent sick contacts. Pt reports Hx of narrowing of distal portion of her small bowel. Pt doesn't report any other symptoms at this time.   Past Medical History  Diagnosis Date  . Asthma   . Hypertension   . Thyroid disease   . Cervical cancer ([place]) 1997    poorly differentiated cervical carcinoma. S/p TAH and radiation x6 wks   . Endometrial cancer ([place]) 1997    coincidental endometrial cancer also  . Hyperlipemia   . 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 Vertebral body hemangioma     T12  . Small bowel obstruction ([place])     12/2008, repeat SBO in 02/2011 also with terminal ilietis   . Complication of anesthesia     took 2 days to wake up after 1 surgery   Past Surgical History  Procedure Laterality Date  . Back surgery    . Tubal ligation    . Skin biopsy     . Abdominal hysterectomy  Feb 1997    TAH/BSO, pelvic and periaortic lymphadenectomy   . Partial nephrectomy      right   Family History  Problem Relation Age of Onset  . Hypertension Other   . Diabetes Other   . Stroke Other   . CAD Other    Social History  Substance Use Topics  . Smoking status: Never Smoker   . Smokeless tobacco: Never Used  . Alcohol Use: No   OB History    No data available      Review of Systems  Constitutional: Positive for chills. Negative for fever.  Gastrointestinal: Positive for nausea, vomiting, abdominal pain, diarrhea and abdominal distention.  All other systems reviewed and are negative.    Allergies  Codeine; Prochlorperazine edisylate; Fentanyl; and Penicillins  Home Medications   Prior to Admission medications   Medication Sig Start Date End Date Taking? Authorizing Provider  acidophilus (RISAQUAD) CAPS Take 1 capsule by mouth daily.    Historical Provider, MD  benazepril-hydrochlorthiazide (LOTENSIN HCT) 20-12.5 MG per tablet Take 1 tablet by mouth daily.    Historical Provider, MD  cholecalciferol (VITAMIN D) 1000 UNITS tablet Take 1,000 Units by mouth 2 (two) times daily.     Historical Provider, MD  clobetasol ointment (TEMOVATE) 4.09 % Apply 1 application topically 2 (two) times daily. 01/15/15   Everitt Amber,  MD  dicyclomine (BENTYL) 20 MG tablet Take 1 tablet (20 mg total) by mouth every 6 (six) hours as needed for spasms (for abdominal cramping). 04/15/13   Linton Flemings, MD  docusate sodium (COLACE) 100 MG capsule Take 100 mg by mouth 2 (two) times daily.    Historical Provider, MD  levothyroxine (SYNTHROID, LEVOTHROID) 75 MCG tablet Take 75 mcg by mouth daily.    Historical Provider, MD  lidocaine (XYLOCAINE) 2 % jelly Reported on 03/23/2015 02/06/15   Historical Provider, MD  LORazepam (ATIVAN) 1 MG tablet  01/31/15   Historical Provider, MD  magnesium gluconate (MAGONATE) 500 MG tablet Take 500 mg by mouth every morning.      Historical Provider, MD  Multiple Vitamins-Minerals (MULTIVITAMIN WITH MINERALS) tablet Take 1 tablet by mouth daily.    Historical Provider, MD  OIL OF OREGANO PO Take 1 capsule by mouth daily.    Historical Provider, MD  omega-3 acid ethyl esters (LOVAZA) 1 G capsule Take 1 g by mouth 2 (two) times daily.    Historical Provider, MD  ondansetron (ZOFRAN ODT) 8 MG disintegrating tablet Take 1 tablet (8 mg total) by mouth every 8 (eight) hours as needed for nausea or vomiting. 02/06/13   Jola Schmidt, MD  oxyCODONE-acetaminophen (PERCOCET/ROXICET) 5-325 MG tablet TK 1 T PO Q 4 H PRN P FOR UP TO 7 DAYS 02/06/15   Historical Provider, MD  silver sulfADIAZINE (SILVADENE) 1 % cream Apply 1 application topically 2 (two) times daily. To the vulva 03/23/15   Dorothyann Gibbs, NP  simvastatin (ZOCOR) 20 MG tablet Take 20 mg by mouth every evening.    Historical Provider, MD  valACYclovir (VALTREX) 1000 MG tablet  01/29/15   Historical Provider, MD  vitamin C (ASCORBIC ACID) 500 MG tablet Take 500 mg by mouth daily.    Historical Provider, MD  Zinc Oxide (DESITIN) 40 % PSTE Apply topically. 02/06/15   Historical Provider, MD   BP 148/92 mmHg  Pulse 76  Temp(Src) 97.5 F (36.4 C) (Oral)  Resp 20  Ht '5\' 5"'$  (1.651 m)  Wt 153 lb (69.4 kg)  BMI 25.46 kg/m2  SpO2 100%   Physical Exam  Constitutional: She is oriented to person, place, and time. She appears well-developed and well-nourished. No distress.  HENT:  Head: Normocephalic and atraumatic.  Mouth/Throat: Oropharynx is clear and moist. No oropharyngeal exudate.  Eyes: Conjunctivae and EOM are normal. Pupils are equal, round, and reactive to light.  Neck: Normal range of motion. Neck supple. Normal carotid pulses present. No tracheal deviation present.  Cardiovascular: Normal rate and intact distal pulses.   Pulses:      Dorsalis pedis pulses are 2+ on the right side, and 2+ on the left side.  Pulmonary/Chest: Effort normal and breath sounds  normal. No stridor. No respiratory distress. She has no wheezes. She has no rales.  Abdominal: Soft. She exhibits no mass. Bowel sounds are increased. There is tenderness. There is no rebound and no guarding.  Musculoskeletal: Normal range of motion.  Neurological: She is alert and oriented to person, place, and time. She displays normal reflexes.  Skin: Skin is warm and dry.  Psychiatric: She has a normal mood and affect. Her behavior is normal.  Nursing note and vitals reviewed.   ED Course  Procedures (including critical care time) DIAGNOSTIC STUDIES: Oxygen Saturation is 100% on RA, normal by my interpretation.    COORDINATION OF CARE: 11:50 PM-Discussed treatment plan with pt at bedside and pt agreed  to plan.     Labs Review Labs Reviewed  CBC WITH DIFFERENTIAL/PLATELET  URINALYSIS, ROUTINE W REFLEX MICROSCOPIC (NOT AT [facility])  I-STAT CHEM 8, ED    Imaging Review No results found.    EKG Interpretation None      MDM   Final diagnoses:  None    Results for orders placed or performed during the hospital encounter of 03/30/15  CBC with Differential/Platelet  Result Value Ref Range   WBC 9.8 4.0 - 10.5 K/uL   RBC 5.92 (H) 3.87 - 5.11 MIL/uL   Hemoglobin 13.1 12.0 - 15.0 g/dL   HCT 39.6 36.0 - 46.0 %   MCV 66.9 (L) 78.0 - 100.0 fL   MCH 22.1 (L) 26.0 - 34.0 pg   MCHC 33.1 30.0 - 36.0 g/dL   RDW 13.9 11.5 - 15.5 %   Platelets 228 150 - 400 K/uL   Neutrophils Relative % 88 %   Neutro Abs 8.6 (H) 1.7 - 7.7 K/uL   Lymphocytes Relative 8 %   Lymphs Abs 0.8 0.7 - 4.0 K/uL   Monocytes Relative 4 %   Monocytes Absolute 0.4 0.1 - 1.0 K/uL   Eosinophils Relative 0 %   Eosinophils Absolute 0.0 0.0 - 0.7 K/uL   Basophils Relative 0 %   Basophils Absolute 0.0 0.0 - 0.1 K/uL   Smear Review MORPHOLOGY UNREMARKABLE   Urinalysis, Routine w reflex microscopic (not at [facility])  Result Value Ref Range   Color, Urine YELLOW YELLOW   APPearance CLEAR CLEAR   Specific Gravity,  Urine 1.023 1.005 - 1.030   pH 8.0 5.0 - 8.0   Glucose, UA NEGATIVE NEGATIVE mg/dL   Hgb urine dipstick NEGATIVE NEGATIVE   Bilirubin Urine NEGATIVE NEGATIVE   Ketones, ur NEGATIVE NEGATIVE mg/dL   Protein, ur NEGATIVE NEGATIVE mg/dL   Nitrite NEGATIVE NEGATIVE   Leukocytes, UA NEGATIVE NEGATIVE  I-stat chem 8, ed  Result Value Ref Range   Sodium 132 (L) 135 - 145 mmol/L   Potassium 3.4 (L) 3.5 - 5.1 mmol/L   Chloride 94 (L) 101 - 111 mmol/L   BUN 17 6 - 20 mg/dL   Creatinine, Ser 0.70 0.44 - 1.00 mg/dL   Glucose, Bld 159 (H) 65 - 99 mg/dL   Calcium, Ion 1.06 (L) 1.13 - 1.30 mmol/L   TCO2 24 0 - 100 mmol/L   Hemoglobin 14.6 12.0 - 15.0 g/dL   HCT 43.0 36.0 - 46.0 %   Ct Abdomen Pelvis W Contrast  03/31/2015  CLINICAL DATA:  70 year old female with abdominal pain, nausea and vomiting EXAM: CT ABDOMEN AND PELVIS WITH CONTRAST TECHNIQUE: Multidetector CT imaging of the abdomen and pelvis was performed using the standard protocol following bolus administration of intravenous contrast. CONTRAST:  175m OMNIPAQUE IOHEXOL 300 MG/ML  SOLN COMPARISON:  Radiograph dated 03/31/2015 and CT dated 02/19/2011 FINDINGS: The visualized lung bases are clear. No intra-abdominal free air. Small free fluid in the upper abdomen as well as within the pelvis. The liver, gallbladder, pancreas, spleen, adrenal glands appear unremarkable. There is stable appearing right renal upper pole irregularity and scarring, likely related to prior surgery. Grossly stable subcentimeter left renal interpolar hypodensity is not well characterized but likely represents a cyst. There is minimal fullness of the renal collecting systems. The visualized ureters and urinary bladder appear unremarkable. Hysterectomy. The terminal ileum is collapsed. There is abutment of the terminal ileum to the right pelvic sidewall surgical clips compatible with adhesions. There is also abutment of loops  of small bowel to the surgical clips along the left  pelvic side wall compatible with adhesions. There is a caliber change in the the bowel loops at the left pelvic surgical clips with dilatation of the small bowel proximal to this area compatible with small bowel obstruction. There is thickened appearance and fluid-filled loops of mid and distal small bowel proximal to this area which may be reactive and represent inflammation or superimposed infection enteritis. The colon is decompressed. The appendix is unremarkable. There is aortoiliac atherosclerotic disease. No portal venous gas identified. There is no adenopathy. Extensive surgical staples noted along the abdominal aorta and bilateral iliac chain, likely related to prior lymph node dissection. There is no adenopathy. The abdominal wall soft tissues appear unremarkable. There is degenerative changes of the spine. Stable appearing grade 2 L5-S1 anterolisthesis. No acute fracture. IMPRESSION: Small-bowel obstruction with transition zone along the left lateral pelvic sidewall due to adhesions of the distal small bowel to the left iliac chain surgical clips. Multiple thickened and fluid-filled loops of small bowel noted proximal to this area which may represent reactive inflammation or superimposed infectious enteritis. Clinical correlation is recommend. Electronically Signed   By: Anner Crete M.D.   On: 03/31/2015 02:26   Dg Abd Acute W/chest  03/31/2015  CLINICAL DATA:  9-year-old female with nausea vomiting and diarrhea and diffuse abdominal pain. EXAM: DG ABDOMEN ACUTE W/ 1V CHEST COMPARISON:  Chest radiograph dated 03/07/2014 and head CT dated 01/30/2015 FINDINGS: The lungs are clear. No pleural effusion or pneumothorax. The cardiac silhouette is within normal limits. No acute osseous pathology. There is no evidence of bowel obstruction. No free air or radiopaque calculi identified. Stop surgical clips noted along the distal aorta and iliac vessels. There is mild scoliosis of the thoracolumbar spine. No  acute fracture. IMPRESSION: No acute intrathoracic, abdominal, or pelvic pathology. Electronically Signed   By: Anner Crete M.D.   On: 03/31/2015 00:43    Medications  lidocaine (XYLOCAINE) 2 % jelly 1 application (not administered)  ondansetron (ZOFRAN) injection 4 mg (4 mg Intravenous Given 03/30/15 2357)  sodium chloride 0.9 % bolus 1,000 mL (0 mLs Intravenous Stopped 03/31/15 0233)  ketorolac (TORADOL) 30 MG/ML injection 30 mg (30 mg Intravenous Given 03/31/15 0058)  dicyclomine (BENTYL) injection 20 mg (20 mg Intramuscular Given 03/31/15 0100)  iohexol (OMNIPAQUE) 300 MG/ML solution 50 mL (50 mLs Oral Contrast Given 03/31/15 0100)  iohexol (OMNIPAQUE) 300 MG/ML solution 100 mL (100 mLs Intravenous Contrast Given 03/31/15 0200)     Case d/w Dr. Zella Richer.  Admit to medicine.  NGT.  Surgery will consult   I personally performed the services described in this documentation, which was scribed in my presence. The recorded information has been reviewed and is accurate.        Veatrice Kells, MD 03/31/15 [phone]

## 2015-03-30 NOTE — ED Notes (Signed)
Pt presents with n/v/d x 7 hours with lower abd pain and distention. Previous abd surgeries.

## 2015-03-31 ENCOUNTER — Encounter (HOSPITAL_COMMUNITY): Payer: Self-pay | Admitting: Emergency Medicine

## 2015-03-31 ENCOUNTER — Emergency Department (HOSPITAL_COMMUNITY): Payer: Medicare Other

## 2015-03-31 ENCOUNTER — Inpatient Hospital Stay (HOSPITAL_COMMUNITY): Payer: Medicare Other

## 2015-03-31 DIAGNOSIS — Z8542 Personal history of malignant neoplasm of other parts of uterus: Secondary | ICD-10-CM | POA: Diagnosis not present

## 2015-03-31 DIAGNOSIS — E876 Hypokalemia: Secondary | ICD-10-CM | POA: Diagnosis present

## 2015-03-31 DIAGNOSIS — K5669 Other intestinal obstruction: Secondary | ICD-10-CM | POA: Diagnosis not present

## 2015-03-31 DIAGNOSIS — K565 Intestinal adhesions [bands] with obstruction (postprocedural) (postinfection): Secondary | ICD-10-CM | POA: Diagnosis present

## 2015-03-31 DIAGNOSIS — E039 Hypothyroidism, unspecified: Secondary | ICD-10-CM | POA: Diagnosis present

## 2015-03-31 DIAGNOSIS — I10 Essential (primary) hypertension: Secondary | ICD-10-CM | POA: Diagnosis present

## 2015-03-31 DIAGNOSIS — R112 Nausea with vomiting, unspecified: Secondary | ICD-10-CM | POA: Diagnosis present

## 2015-03-31 DIAGNOSIS — Z8541 Personal history of malignant neoplasm of cervix uteri: Secondary | ICD-10-CM | POA: Diagnosis not present

## 2015-03-31 DIAGNOSIS — Y842 Radiological procedure and radiotherapy as the cause of abnormal reaction of the patient, or of later complication, without mention of misadventure at the time of the procedure: Secondary | ICD-10-CM | POA: Diagnosis present

## 2015-03-31 DIAGNOSIS — Z905 Acquired absence of kidney: Secondary | ICD-10-CM | POA: Diagnosis not present

## 2015-03-31 DIAGNOSIS — Z923 Personal history of irradiation: Secondary | ICD-10-CM | POA: Diagnosis not present

## 2015-03-31 DIAGNOSIS — K56609 Unspecified intestinal obstruction, unspecified as to partial versus complete obstruction: Secondary | ICD-10-CM | POA: Insufficient documentation

## 2015-03-31 LAB — CBC WITH DIFFERENTIAL/PLATELET
Basophils Absolute: 0 10*3/uL (ref 0.0–0.1)
Basophils Relative: 0 %
Eosinophils Absolute: 0 10*3/uL (ref 0.0–0.7)
Eosinophils Relative: 0 %
HCT: 39.6 % (ref 36.0–46.0)
Hemoglobin: 13.1 g/dL (ref 12.0–15.0)
Lymphocytes Relative: 8 %
Lymphs Abs: 0.8 10*3/uL (ref 0.7–4.0)
MCH: 22.1 pg — ABNORMAL LOW (ref 26.0–34.0)
MCHC: 33.1 g/dL (ref 30.0–36.0)
MCV: 66.9 fL — ABNORMAL LOW (ref 78.0–100.0)
Monocytes Absolute: 0.4 10*3/uL (ref 0.1–1.0)
Monocytes Relative: 4 %
Neutro Abs: 8.6 10*3/uL — ABNORMAL HIGH (ref 1.7–7.7)
Neutrophils Relative %: 88 %
Platelets: 228 10*3/uL (ref 150–400)
RBC: 5.92 MIL/uL — ABNORMAL HIGH (ref 3.87–5.11)
RDW: 13.9 % (ref 11.5–15.5)
WBC: 9.8 10*3/uL (ref 4.0–10.5)

## 2015-03-31 LAB — URINALYSIS, ROUTINE W REFLEX MICROSCOPIC
Bilirubin Urine: NEGATIVE
Glucose, UA: NEGATIVE mg/dL
Hgb urine dipstick: NEGATIVE
Ketones, ur: NEGATIVE mg/dL
Leukocytes, UA: NEGATIVE
Nitrite: NEGATIVE
Protein, ur: NEGATIVE mg/dL
Specific Gravity, Urine: 1.023 (ref 1.005–1.030)
pH: 8 (ref 5.0–8.0)

## 2015-03-31 LAB — I-STAT CHEM 8, ED
BUN: 17 mg/dL (ref 6–20)
Calcium, Ion: 1.06 mmol/L — ABNORMAL LOW (ref 1.13–1.30)
Chloride: 94 mmol/L — ABNORMAL LOW (ref 101–111)
Creatinine, Ser: 0.7 mg/dL (ref 0.44–1.00)
Glucose, Bld: 159 mg/dL — ABNORMAL HIGH (ref 65–99)
HCT: 43 % (ref 36.0–46.0)
Hemoglobin: 14.6 g/dL (ref 12.0–15.0)
Potassium: 3.4 mmol/L — ABNORMAL LOW (ref 3.5–5.1)
Sodium: 132 mmol/L — ABNORMAL LOW (ref 135–145)
TCO2: 24 mmol/L (ref 0–100)

## 2015-03-31 MED ORDER — CETYLPYRIDINIUM CHLORIDE 0.05 % MT LIQD
7.0000 mL | Freq: Two times a day (BID) | OROMUCOSAL | Status: DC
  Administered 2015-03-31 (×2): 7 mL via OROMUCOSAL

## 2015-03-31 MED ORDER — ONDANSETRON HCL 4 MG/2ML IJ SOLN: 4.0000 mg | Freq: Four times a day (QID) | INTRAMUSCULAR | Status: DC | PRN

## 2015-03-31 MED ORDER — LEVOTHYROXINE SODIUM 100 MCG IV SOLR
25.0000 ug | Freq: Every day | INTRAVENOUS | Status: DC
  Administered 2015-03-31 – 2015-04-01 (×2): 25 ug via INTRAVENOUS
  Filled 2015-03-31 (×3): qty 5

## 2015-03-31 MED ORDER — LIP MEDEX EX OINT
TOPICAL_OINTMENT | CUTANEOUS | Status: AC
  Administered 2015-03-31: 1
  Filled 2015-03-31: qty 7

## 2015-03-31 MED ORDER — MENTHOL 3 MG MT LOZG
1.0000 | LOZENGE | OROMUCOSAL | Status: DC | PRN
  Filled 2015-03-31: qty 9

## 2015-03-31 MED ORDER — MORPHINE SULFATE (PF) 2 MG/ML IV SOLN
2.0000 mg | INTRAVENOUS | Status: DC | PRN
  Administered 2015-03-31 – 2015-04-02 (×4): 2 mg via INTRAVENOUS
  Filled 2015-03-31 (×4): qty 1

## 2015-03-31 MED ORDER — PHENOL 1.4 % MT LIQD
1.0000 | OROMUCOSAL | Status: DC | PRN
  Filled 2015-03-31: qty 177

## 2015-03-31 MED ORDER — IOHEXOL 300 MG/ML  SOLN
100.0000 mL | Freq: Once | INTRAMUSCULAR | Status: AC | PRN
  Administered 2015-03-31: 100 mL via INTRAVENOUS

## 2015-03-31 MED ORDER — HYDRALAZINE HCL 20 MG/ML IJ SOLN
10.0000 mg | INTRAMUSCULAR | Status: DC | PRN
  Administered 2015-03-31: 10 mg via INTRAVENOUS
  Filled 2015-03-31: qty 1

## 2015-03-31 MED ORDER — ALBUTEROL SULFATE (2.5 MG/3ML) 0.083% IN NEBU: 2.5000 mg | INHALATION_SOLUTION | RESPIRATORY_TRACT | Status: DC | PRN

## 2015-03-31 MED ORDER — ENOXAPARIN SODIUM 40 MG/0.4ML ~~LOC~~ SOLN
40.0000 mg | SUBCUTANEOUS | Status: DC
  Administered 2015-03-31 – 2015-04-03 (×4): 40 mg via SUBCUTANEOUS
  Filled 2015-03-31 (×5): qty 0.4

## 2015-03-31 MED ORDER — LORAZEPAM 2 MG/ML IJ SOLN
1.0000 mg | Freq: Four times a day (QID) | INTRAMUSCULAR | Status: DC | PRN
  Administered 2015-03-31 – 2015-04-03 (×4): 1 mg via INTRAVENOUS
  Filled 2015-03-31 (×4): qty 1

## 2015-03-31 MED ORDER — LIDOCAINE HCL 2 % EX GEL
1.0000 "application " | Freq: Once | CUTANEOUS | Status: AC
  Administered 2015-03-31: 1 via TOPICAL
  Filled 2015-03-31: qty 11

## 2015-03-31 MED ORDER — SODIUM CHLORIDE 0.9 % IV BOLUS (SEPSIS)
1000.0000 mL | Freq: Once | INTRAVENOUS | Status: AC
  Administered 2015-03-31: 1000 mL via INTRAVENOUS

## 2015-03-31 MED ORDER — KETOROLAC TROMETHAMINE 30 MG/ML IJ SOLN
30.0000 mg | Freq: Once | INTRAMUSCULAR | Status: AC
  Administered 2015-03-31: 30 mg via INTRAVENOUS
  Filled 2015-03-31: qty 1

## 2015-03-31 MED ORDER — SILVER SULFADIAZINE 1 % EX CREA
1.0000 "application " | TOPICAL_CREAM | Freq: Two times a day (BID) | CUTANEOUS | Status: DC
  Administered 2015-03-31 – 2015-04-04 (×9): 1 via TOPICAL
  Filled 2015-03-31: qty 85

## 2015-03-31 MED ORDER — IOHEXOL 300 MG/ML  SOLN
50.0000 mL | Freq: Once | INTRAMUSCULAR | Status: AC | PRN
  Administered 2015-03-31: 50 mL via ORAL

## 2015-03-31 MED ORDER — CHLORHEXIDINE GLUCONATE 0.12 % MT SOLN
15.0000 mL | Freq: Two times a day (BID) | OROMUCOSAL | Status: DC
  Administered 2015-04-01 – 2015-04-02 (×2): 15 mL via OROMUCOSAL
  Filled 2015-03-31 (×12): qty 15

## 2015-03-31 MED ORDER — POTASSIUM CHLORIDE 10 MEQ/100ML IV SOLN
10.0000 meq | INTRAVENOUS | Status: AC
Start: 2015-03-31 — End: 2015-03-31
  Administered 2015-03-31 (×2): 10 meq via INTRAVENOUS
  Filled 2015-03-31 (×2): qty 100

## 2015-03-31 MED ORDER — SODIUM CHLORIDE 0.9 % IV SOLN
INTRAVENOUS | Status: DC
  Administered 2015-03-31 – 2015-04-01 (×5): via INTRAVENOUS

## 2015-03-31 MED ORDER — ONDANSETRON HCL 4 MG PO TABS: 4.0000 mg | ORAL_TABLET | Freq: Four times a day (QID) | ORAL | Status: DC | PRN

## 2015-03-31 MED ORDER — DICYCLOMINE HCL 10 MG/ML IM SOLN
20.0000 mg | Freq: Once | INTRAMUSCULAR | Status: AC
  Administered 2015-03-31: 20 mg via INTRAMUSCULAR
  Filled 2015-03-31: qty 2

## 2015-03-31 NOTE — Progress Notes (Signed)
[facility] PROGRESS NOTE  YITTEL EMRICH GMW:[id number] DOB: 22-Dec-1945 DOA: 03/30/2015 PCP: Vidal Schwalbe, MD  Assessment/Plan: 1. Small bowel obstruction. -Mrs Fontenot is a pleasant 70 year old female with a history of cervical cancer status post radiation therapy in 1997. She has a history of small bowel obstruction secondary to adhesions. -Presented with complaints of nausea, vomiting, abdominal distention. Initial workup included a CT scan of abdomen and pelvis that showed small bowel obstruction due to adhesions. -Gen. surgery consulted -She was made nothing by mouth with placement of NG tube and started on IV fluids. -Will follow up on a.m. abdominal film.  -Continue supportive care  2.  Hypothyroidism. -She is on Synthroid 75 g by mouth daily at home, will transition to IV Synthroid since she is nothing by mouth with NG tube  3. History of hypertension -She had been on hydrochlorothiazide/Lotensin 20/12.5 by mouth daily at home which has been discontinued due to nothing by mouth status. -Like to continue as needed hydralazine 10 mg IV every 4 hours as needed  4.  Hypokalemia -Left showing potassium of 3.4. Likely secondary to GI losses -This was replaced with IV potassium  Code Status: Full code Family Communication:  Disposition Plan:    Consultants:  General surgery  Procedures:  Status post NG tube placement  HPI/Subjective: Mrs Bruno is a pleasant 70 year old female with a past medical history of cervical cancer status post radiation therapy in 1997, history of small bowel obstruction, admitted to the medicine service on 03/31/2015 when she presented with complaints of abdominal pain associated with distention, nausea and vomiting. Initial workup included a CT scan of abdomen and pelvis that showed small bowel obstruction along the left lateral pelvic sidewall due to adhesions of the distal small bowel. She was seen and evaluated by general surgery. She was  made nothing by mouth with placement of NG tube. Started on IV fluids.  Objective: Filed Vitals:   03/31/15 0848 03/31/15 1131  BP: 193/96 165/80  Pulse: 65 79  Temp: 98.5 F (36.9 C) 99.2 F (37.3 C)  Resp:      Intake/Output Summary (Last 24 hours) at 03/31/15 1418 Last data filed at 03/31/15 1130  Gross per 24 hour  Intake 1536.67 ml  Output    700 ml  Net 836.67 ml   Filed Weights   03/30/15 2313  Weight: 69.4 kg (153 lb)    Exam:   General:  Calm, pleasant, cooperative in no acute distress, NG tube in place  Cardiovascular: Regular rate rhythm normal S1-S2  Respiratory: Normal respiratory effort lungs are clear  Abdomen: Abdomen is distended mild generalized tenderness to palpation  Musculoskeletal: No edema  Data Reviewed: Basic Metabolic Panel:  Recent Labs Lab 03/31/15 0003  NA 132*  K 3.4*  CL 94*  GLUCOSE 159*  BUN 17  CREATININE 0.70   Liver Function Tests: No results for input(s): AST, ALT, ALKPHOS, BILITOT, PROT, ALBUMIN in the last 168 hours. No results for input(s): LIPASE, AMYLASE in the last 168 hours. No results for input(s): AMMONIA in the last 168 hours. CBC:  Recent Labs Lab 03/30/15 2353 03/31/15 0003  WBC 9.8  --   NEUTROABS 8.6*  --   HGB 13.1 14.6  HCT 39.6 43.0  MCV 66.9*  --   PLT 228  --    Cardiac Enzymes: No results for input(s): CKTOTAL, CKMB, CKMBINDEX, TROPONINI in the last 168 hours. BNP (last 3 results) No results for input(s): BNP in the last 8760 hours.  ProBNP (last 3 results) No results for input(s): PROBNP in the last 8760 hours.  CBG: No results for input(s): GLUCAP in the last 168 hours.  No results found for this or any previous visit (from the past 240 hour(s)).   Studies: Dg Abd 1 View  03/31/2015  CLINICAL DATA:  NG tube placement. EXAM: ABDOMEN - 1 VIEW COMPARISON:  CT chest 03/31/2015 FINDINGS: Enteric tube with tip in the left upper quadrant consistent with location in the upper  stomach. Residual contrast material in the stomach and small bowel. Residual contrast material in the bladder and urinary collecting systems. Surgical clips in the pelvis. IMPRESSION: Enteric tube tip is in the left upper quadrant consistent with location in the upper stomach. Electronically Signed   By: Lucienne Capers M.D.   On: 03/31/2015 04:17   Ct Abdomen Pelvis W Contrast  03/31/2015  CLINICAL DATA:  70 year old female with abdominal pain, nausea and vomiting EXAM: CT ABDOMEN AND PELVIS WITH CONTRAST TECHNIQUE: Multidetector CT imaging of the abdomen and pelvis was performed using the standard protocol following bolus administration of intravenous contrast. CONTRAST:  125m OMNIPAQUE IOHEXOL 300 MG/ML  SOLN COMPARISON:  Radiograph dated 03/31/2015 and CT dated 02/19/2011 FINDINGS: The visualized lung bases are clear. No intra-abdominal free air. Small free fluid in the upper abdomen as well as within the pelvis. The liver, gallbladder, pancreas, spleen, adrenal glands appear unremarkable. There is stable appearing right renal upper pole irregularity and scarring, likely related to prior surgery. Grossly stable subcentimeter left renal interpolar hypodensity is not well characterized but likely represents a cyst. There is minimal fullness of the renal collecting systems. The visualized ureters and urinary bladder appear unremarkable. Hysterectomy. The terminal ileum is collapsed. There is abutment of the terminal ileum to the right pelvic sidewall surgical clips compatible with adhesions. There is also abutment of loops of small bowel to the surgical clips along the left pelvic side wall compatible with adhesions. There is a caliber change in the the bowel loops at the left pelvic surgical clips with dilatation of the small bowel proximal to this area compatible with small bowel obstruction. There is thickened appearance and fluid-filled loops of mid and distal small bowel proximal to this area which may be  reactive and represent inflammation or superimposed infection enteritis. The colon is decompressed. The appendix is unremarkable. There is aortoiliac atherosclerotic disease. No portal venous gas identified. There is no adenopathy. Extensive surgical staples noted along the abdominal aorta and bilateral iliac chain, likely related to prior lymph node dissection. There is no adenopathy. The abdominal wall soft tissues appear unremarkable. There is degenerative changes of the spine. Stable appearing grade 2 L5-S1 anterolisthesis. No acute fracture. IMPRESSION: Small-bowel obstruction with transition zone along the left lateral pelvic sidewall due to adhesions of the distal small bowel to the left iliac chain surgical clips. Multiple thickened and fluid-filled loops of small bowel noted proximal to this area which may represent reactive inflammation or superimposed infectious enteritis. Clinical correlation is recommend. Electronically Signed   By: AAnner CreteM.D.   On: 03/31/2015 02:26   Dg Abd Acute W/chest  03/31/2015  CLINICAL DATA:  70year-old female with nausea vomiting and diarrhea and diffuse abdominal pain. EXAM: DG ABDOMEN ACUTE W/ 1V CHEST COMPARISON:  Chest radiograph dated 03/07/2014 and head CT dated 01/30/2015 FINDINGS: The lungs are clear. No pleural effusion or pneumothorax. The cardiac silhouette is within normal limits. No acute osseous pathology. There is no evidence of bowel obstruction. No free  air or radiopaque calculi identified. Stop surgical clips noted along the distal aorta and iliac vessels. There is mild scoliosis of the thoracolumbar spine. No acute fracture. IMPRESSION: No acute intrathoracic, abdominal, or pelvic pathology. Electronically Signed   By: Anner Crete M.D.   On: 03/31/2015 00:43    Scheduled Meds: . antiseptic oral rinse  7 mL Mouth Rinse q12n4p  . chlorhexidine  15 mL Mouth Rinse BID  . enoxaparin (LOVENOX) injection  40 mg Subcutaneous Q24H  . lip balm       . silver sulfADIAZINE  1 application Topical BID   Continuous Infusions: . sodium chloride 100 mL/hr at 03/31/15 9198    Principal Problem:   Small bowel obstruction ([place]) Active Problems:   Essential hypertension   Hypokalemia   Hypothyroidism    Time spent:     Kelvin Cellar  [facility] Pager [phone]. If 7PM-7AM, please contact night-coverage at [email or web address], password [email or web address] 03/31/2015, 2:18 PM  LOS: 0 days

## 2015-03-31 NOTE — H&P (Signed)
[facility] History and Physical  [name] Richards JOA:[id number] DOB: Jul 31, 1945 DOA: 03/30/2015  Referring physician: Randal Buba PCP: [name] Schwalbe, MD   Chief Complaint: Vomiting  HPI:  [name] Richards is a 70 year old female with a past medical history significant for vulvar/endometrial/cervical cancer, hypertension, asthma, small bowel obstruction, hyperlipidemia, hypothyroidism; who presents with complaints of nausea and vomiting. Symptoms started around 4- 4:30 PM yesterday afternoon. Associated symptoms included chills, abdominal distention/pain, diarrhea(>5 episodes). Abdominal pain was in the lower part of the abdomen. She reports symptoms usually clear within 4-6 hours, but this episode is not like usual. Almost 12 hours have past and symptoms have not resolved completely. She reports that she just had vulvar cancer surgery on 02/06/2015 Dr Marti Sleigh. Patient notes that she's been taking MiraLAX and Colace scheduled. Denies any fever, chest pain, palpitations, or shortness of breath.  Upon admission patient is evaluated with a CT scan of the abdomen which showedleft lateral pelvic sidewall due to adhesions of the distal small bowel to the left iliac chain surgical clips. General surgery was consulted and recommended placement of an NG tube.   Review of Systems  Constitutional: Positive for chills and malaise/fatigue. Negative for fever and weight loss.  HENT: Negative for hearing loss.   Respiratory: Negative for hemoptysis and sputum production.   Cardiovascular: Negative for palpitations and leg swelling.  Gastrointestinal: Positive for nausea, vomiting, abdominal pain and diarrhea. Negative for blood in stool.  Genitourinary: Negative for urgency and frequency.  Musculoskeletal: Negative for back pain and joint pain.  Skin: Negative for itching and rash.  Neurological: Negative for headaches.  Psychiatric/Behavioral: Negative for suicidal ideas.      Past Medical  History  Diagnosis Date  . Asthma   . Hypertension   . Thyroid disease   . Cervical cancer ([place]) 1997    poorly differentiated cervical carcinoma. S/p TAH and radiation x6 wks   . Endometrial cancer ([place]) 1997    coincidental endometrial cancer also  . Hyperlipemia   . 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 Vertebral body hemangioma     T12  . Small bowel obstruction ([place])     12/2008, repeat SBO in 02/2011 also with terminal ilietis   . Complication of anesthesia     took 2 days to wake up after 1 surgery     Past Surgical History  Procedure Laterality Date  . Back surgery    . Tubal ligation    . Skin biopsy    . Abdominal hysterectomy  Feb 1997    TAH/BSO, pelvic and periaortic lymphadenectomy   . Partial nephrectomy      right      Social History:  reports that she has never smoked. She has never used smokeless tobacco. She reports that she does not drink alcohol or use illicit drugs.   Allergies  Allergen Reactions  . Codeine     Reporting amnesia  . Prochlorperazine Edisylate     Jaws locked  . Fentanyl     hypotension  . Penicillins Hives    Family History  Problem Relation Age of Onset  . Hypertension Other   . Diabetes Other   . Stroke Other   . CAD Other       Prior to Admission medications   Medication Sig Start Date End Date Taking? Authorizing Provider  acidophilus (RISAQUAD) CAPS Take 1 capsule by mouth daily.    Historical Provider, MD  benazepril-hydrochlorthiazide (LOTENSIN HCT)  20-12.5 MG per tablet Take 1 tablet by mouth daily.    Historical Provider, MD  cholecalciferol (VITAMIN D) 1000 UNITS tablet Take 1,000 Units by mouth 2 (two) times daily.     Historical Provider, MD  clobetasol ointment (TEMOVATE) 3.73 % Apply 1 application topically 2 (two) times daily. 01/15/15   Everitt Amber, MD  dicyclomine (BENTYL) 20 MG tablet Take 1 tablet (20 mg total) by mouth every 6 (six) hours as needed for spasms (for abdominal  cramping). 04/15/13   Linton Flemings, MD  docusate sodium (COLACE) 100 MG capsule Take 100 mg by mouth 2 (two) times daily.    Historical Provider, MD  levothyroxine (SYNTHROID, LEVOTHROID) 75 MCG tablet Take 75 mcg by mouth daily.    Historical Provider, MD  lidocaine (XYLOCAINE) 2 % jelly Reported on 03/23/2015 02/06/15   Historical Provider, MD  LORazepam (ATIVAN) 1 MG tablet  01/31/15   Historical Provider, MD  magnesium gluconate (MAGONATE) 500 MG tablet Take 500 mg by mouth every morning.     Historical Provider, MD  Multiple Vitamins-Minerals (MULTIVITAMIN WITH MINERALS) tablet Take 1 tablet by mouth daily.    Historical Provider, MD  OIL OF OREGANO PO Take 1 capsule by mouth daily.    Historical Provider, MD  omega-3 acid ethyl esters (LOVAZA) 1 G capsule Take 1 g by mouth 2 (two) times daily.    Historical Provider, MD  ondansetron (ZOFRAN ODT) 8 MG disintegrating tablet Take 1 tablet (8 mg total) by mouth every 8 (eight) hours as needed for nausea or vomiting. 02/06/13   Jola Schmidt, MD  oxyCODONE-acetaminophen (PERCOCET/ROXICET) 5-325 MG tablet TK 1 T PO Q 4 H PRN P FOR UP TO 7 DAYS 02/06/15   Historical Provider, MD  silver sulfADIAZINE (SILVADENE) 1 % cream Apply 1 application topically 2 (two) times daily. To the vulva 03/23/15   Dorothyann Gibbs, NP  simvastatin (ZOCOR) 20 MG tablet Take 20 mg by mouth every evening.    Historical Provider, MD  valACYclovir (VALTREX) 1000 MG tablet  01/29/15   Historical Provider, MD  vitamin C (ASCORBIC ACID) 500 MG tablet Take 500 mg by mouth daily.    Historical Provider, MD  Zinc Oxide (DESITIN) 40 % PSTE Apply topically. 02/06/15   Historical Provider, MD     Physical Exam: Filed Vitals:   03/30/15 2313 03/31/15 0242 03/31/15 0427  BP: 148/92 158/83 144/86  Pulse: 76 69 70  Temp: 97.5 F (36.4 C)    TempSrc: Oral    Resp: '20 18 18  '$ Height: '5\' 5"'$  (1.651 m)    Weight: 69.4 kg (153 lb)    SpO2: 100% 97% 97%     Constitutional: Vital signs  reviewed. Patient is a well-developed and well-nourished in no acute distress and cooperative with exam. Alert and oriented x3.  Head: Normocephalic and atraumatic  Ear: TM normal bilaterally  Mouth: no erythema or exudates, MMM  NG tube placed Eyes: PERRL, EOMI, conjunctivae normal, No scleral icterus.  Neck: Supple, Trachea midline normal ROM, No JVD, mass, thyromegaly, or carotid bruit present.  Cardiovascular: RRR, S1 normal, S2 normal, no MRG, pulses symmetric and intact bilaterally  Pulmonary/Chest: CTAB, no wheezes, rales, or rhonchi  Abdominal: Soft. Positive bowel sounds in all 4 quadrants, mild tenderness to palpation of the lower abdomen. GU: no CVA tenderness Musculoskeletal: No joint deformities, erythema, or stiffness, ROM full and no nontender Ext: no edema and no cyanosis, pulses palpable bilaterally (DP and PT)  Hematology: no cervical, inginal,  or axillary adenopathy.  Neurological: A&O x3, Strenght is normal and symmetric bilaterally, cranial nerve II-XII are grossly intact, no focal motor deficit, sensory intact to light touch bilaterally.  Skin: Warm, dry and intact. No rash, cyanosis, or clubbing.  Psychiatric: Normal mood and affect. speech and behavior is normal. Judgment and thought content normal. Cognition and memory are normal.      Data Review   Micro Results No results found for this or any previous visit (from the past 240 hour(s)).  Radiology Reports Dg Abd 1 View  03/31/2015  CLINICAL DATA:  NG tube placement. EXAM: ABDOMEN - 1 VIEW COMPARISON:  CT chest 03/31/2015 FINDINGS: Enteric tube with tip in the left upper quadrant consistent with location in the upper stomach. Residual contrast material in the stomach and small bowel. Residual contrast material in the bladder and urinary collecting systems. Surgical clips in the pelvis. IMPRESSION: Enteric tube tip is in the left upper quadrant consistent with location in the upper stomach. Electronically Signed    By: Lucienne Capers M.D.   On: 03/31/2015 04:17   Ct Abdomen Pelvis W Contrast  03/31/2015  CLINICAL DATA:  70 year old female with abdominal pain, nausea and vomiting EXAM: CT ABDOMEN AND PELVIS WITH CONTRAST TECHNIQUE: Multidetector CT imaging of the abdomen and pelvis was performed using the standard protocol following bolus administration of intravenous contrast. CONTRAST:  167m OMNIPAQUE IOHEXOL 300 MG/ML  SOLN COMPARISON:  Radiograph dated 03/31/2015 and CT dated 02/19/2011 FINDINGS: The visualized lung bases are clear. No intra-abdominal free air. Small free fluid in the upper abdomen as well as within the pelvis. The liver, gallbladder, pancreas, spleen, adrenal glands appear unremarkable. There is stable appearing right renal upper pole irregularity and scarring, likely related to prior surgery. Grossly stable subcentimeter left renal interpolar hypodensity is not well characterized but likely represents a cyst. There is minimal fullness of the renal collecting systems. The visualized ureters and urinary bladder appear unremarkable. Hysterectomy. The terminal ileum is collapsed. There is abutment of the terminal ileum to the right pelvic sidewall surgical clips compatible with adhesions. There is also abutment of loops of small bowel to the surgical clips along the left pelvic side wall compatible with adhesions. There is a caliber change in the the bowel loops at the left pelvic surgical clips with dilatation of the small bowel proximal to this area compatible with small bowel obstruction. There is thickened appearance and fluid-filled loops of mid and distal small bowel proximal to this area which may be reactive and represent inflammation or superimposed infection enteritis. The colon is decompressed. The appendix is unremarkable. There is aortoiliac atherosclerotic disease. No portal venous gas identified. There is no adenopathy. Extensive surgical staples noted along the abdominal aorta and  bilateral iliac chain, likely related to prior lymph node dissection. There is no adenopathy. The abdominal wall soft tissues appear unremarkable. There is degenerative changes of the spine. Stable appearing grade 2 L5-S1 anterolisthesis. No acute fracture. IMPRESSION: Small-bowel obstruction with transition zone along the left lateral pelvic sidewall due to adhesions of the distal small bowel to the left iliac chain surgical clips. Multiple thickened and fluid-filled loops of small bowel noted proximal to this area which may represent reactive inflammation or superimposed infectious enteritis. Clinical correlation is recommend. Electronically Signed   By: AAnner CreteM.D.   On: 03/31/2015 02:26   Dg Abd Acute W/chest  03/31/2015  CLINICAL DATA:  70year-old female with nausea vomiting and diarrhea and diffuse abdominal pain. EXAM: DG ABDOMEN  ACUTE W/ 1V CHEST COMPARISON:  Chest radiograph dated 03/07/2014 and head CT dated 01/30/2015 FINDINGS: The lungs are clear. No pleural effusion or pneumothorax. The cardiac silhouette is within normal limits. No acute osseous pathology. There is no evidence of bowel obstruction. No free air or radiopaque calculi identified. Stop surgical clips noted along the distal aorta and iliac vessels. There is mild scoliosis of the thoracolumbar spine. No acute fracture. IMPRESSION: No acute intrathoracic, abdominal, or pelvic pathology. Electronically Signed   By: Anner Crete M.D.   On: 03/31/2015 00:43     CBC  Recent Labs Lab 03/30/15 2353 03/31/15 0003  WBC 9.8  --   HGB 13.1 14.6  HCT 39.6 43.0  PLT 228  --   MCV 66.9*  --   MCH 22.1*  --   MCHC 33.1  --   RDW 13.9  --   LYMPHSABS 0.8  --   MONOABS 0.4  --   EOSABS 0.0  --   BASOSABS 0.0  --     Chemistries   Recent Labs Lab 03/31/15 0003  NA 132*  K 3.4*  CL 94*  GLUCOSE 159*  BUN 17  CREATININE 0.70    ------------------------------------------------------------------------------------------------------------------ estimated creatinine clearance is 64 mL/min (by C-G formula based on Cr of 0.7). ------------------------------------------------------------------------------------------------------------------ No results for input(s): HGBA1C in the last 72 hours. ------------------------------------------------------------------------------------------------------------------ No results for input(s): CHOL, HDL, LDLCALC, TRIG, CHOLHDL, LDLDIRECT in the last 72 hours. ------------------------------------------------------------------------------------------------------------------ No results for input(s): TSH, T4TOTAL, T3FREE, THYROIDAB in the last 72 hours.  Invalid input(s): FREET3 ------------------------------------------------------------------------------------------------------------------ No results for input(s): VITAMINB12, FOLATE, FERRITIN, TIBC, IRON, RETICCTPCT in the last 72 hours.  Coagulation profile No results for input(s): INR, PROTIME in the last 168 hours.  No results for input(s): DDIMER in the last 72 hours.  Cardiac Enzymes No results for input(s): CKMB, TROPONINI, MYOGLOBIN in the last 168 hours.  Invalid input(s): CK ------------------------------------------------------------------------------------------------------------------ Invalid input(s): POCBNP   CBG: No results for input(s): GLUCAP in the last 168 hours.       Assessment/Plan Active Problems: SBO (small bowel obstruction) acute. Patient has had previous SBO's in the past. CT scan showing signs of multiple adhesions as a possible cause of patient's acute small bowel obstruction causing symptoms of nausea, vomiting and abdominal pain. Alternatively symptoms could be multifactorial including over use of medications such as Colace and MiraLAX +/- narcotic use.  - Admit to a MedSurg bed  - Continue  NGT to suction  - NPO for now, allowing for bowel rest - Follow-up surgery recommendations - May need to convert medications to IV if needed  Hypokalemia: Potassium 3.4 - Potassium Chloride 20 meq x 1 dose now - Continue to monitor and replace as needed  Hypothyroidism -May consider converting levothyroxine to IV while patient is NPO   Hypertension - hydralazine Prn   History of cervical and vulvar cancer: Status post surgery and radiation  Lovenox  For DVT prophylaxis  Code Status:   full Family Communication: bedside Disposition Plan: admit   Total time spent 55 minutes.Greater than 50% of this time was spent in counseling, explanation of diagnosis, planning of further management, and coordination of care  [facility] Pager [phone]  If 7PM-7AM, please contact night-coverage [email or web address] Password [facility] 03/31/2015, 4:53 AM

## 2015-03-31 NOTE — Consult Note (Signed)
Reason for Consult:SBO  Referring Physician: Dr. Gwynneth Macleod is an 70 y.o. female.  HPI: 1 week ago, she had some mild distention and cramping lower abdominal pain like previous episodes of partial small bowel obstruction. She thought she had a virus. This got better and today 2. Yesterday however she had worsening pain with distention and nausea and vomiting. She had a bowel movement last night at 10:00 and has passed a little gas. She presented to the emergency department and initially plain x-rays did not demonstrate any evidence of bowel obstruction. However, CT scan was performed demonstrating dilated loops of small bowel down to a point in the left lower quadrant where there appears to be an edematous segment of small bowel and a transition zone. 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 This area in the terminal ileum has been responsible for previous partial small bowel obstructions. Her last admission for this was back in 2013. Currently, she is having no abdominal pain. She's had multiple extensive abdominal surgeries and bowel resection in the past.  Past Medical History  Diagnosis Date  . Asthma   . Hypertension   . Thyroid disease   . Cervical cancer ([place]) 1997    poorly differentiated cervical carcinoma. S/p TAH and radiation x6 wks   . Endometrial cancer ([place]) 1997    coincidental endometrial cancer also  . Hyperlipemia   . 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 Vertebral body hemangioma     T12  . Small bowel obstruction ([place])     12/2008, repeat SBO in 02/2011 also with terminal ilietis   . Complication of anesthesia     took 2 days to wake up after 1 surgery    Past Surgical History  Procedure Laterality Date  . Back surgery    . Tubal ligation    . Skin biopsy    . Abdominal hysterectomy  Feb 1997    TAH/BSO, pelvic and periaortic lymphadenectomy   . Partial  nephrectomy      right    Family History  Problem Relation Age of Onset  . Hypertension Other   . Diabetes Other   . Stroke Other   . CAD Other     Social History:  reports that she has never smoked. She has never used smokeless tobacco. She reports that she does not drink alcohol or use illicit drugs.  Allergies:  Allergies  Allergen Reactions  . Codeine     Reporting amnesia  . Prochlorperazine Edisylate     Jaws locked  . Fentanyl     hypotension  . Penicillins Hives    Prior to Admission medications   Medication Sig Start Date End Date Taking? Authorizing Provider  acidophilus (RISAQUAD) CAPS Take 1 capsule by mouth daily.    Historical Provider, MD  benazepril-hydrochlorthiazide (LOTENSIN HCT) 20-12.5 MG per tablet Take 1 tablet by mouth daily.    Historical Provider, MD  cholecalciferol (VITAMIN D) 1000 UNITS tablet Take 1,000 Units by mouth 2 (two) times daily.     Historical Provider, MD  clobetasol ointment (TEMOVATE) 2.77 % Apply 1 application topically 2 (two) times daily. 01/15/15   Everitt Amber, MD  dicyclomine (BENTYL) 20 MG tablet Take 1 tablet (20 mg total) by mouth every 6 (six) hours as needed for spasms (for abdominal cramping). 04/15/13   Linton Flemings, MD  docusate sodium (COLACE) 100 MG  capsule Take 100 mg by mouth 2 (two) times daily.    Historical Provider, MD  levothyroxine (SYNTHROID, LEVOTHROID) 75 MCG tablet Take 75 mcg by mouth daily.    Historical Provider, MD  lidocaine (XYLOCAINE) 2 % jelly Reported on 03/23/2015 02/06/15   Historical Provider, MD  LORazepam (ATIVAN) 1 MG tablet  01/31/15   Historical Provider, MD  magnesium gluconate (MAGONATE) 500 MG tablet Take 500 mg by mouth every morning.     Historical Provider, MD  Multiple Vitamins-Minerals (MULTIVITAMIN WITH MINERALS) tablet Take 1 tablet by mouth daily.    Historical Provider, MD  OIL OF OREGANO PO Take 1 capsule by mouth daily.    Historical Provider, MD  omega-3 acid ethyl esters (LOVAZA) 1  G capsule Take 1 g by mouth 2 (two) times daily.    Historical Provider, MD  ondansetron (ZOFRAN ODT) 8 MG disintegrating tablet Take 1 tablet (8 mg total) by mouth every 8 (eight) hours as needed for nausea or vomiting. 02/06/13   Jola Schmidt, MD  oxyCODONE-acetaminophen (PERCOCET/ROXICET) 5-325 MG tablet TK 1 T PO Q 4 H PRN P FOR UP TO 7 DAYS 02/06/15   Historical Provider, MD  silver sulfADIAZINE (SILVADENE) 1 % cream Apply 1 application topically 2 (two) times daily. To the vulva 03/23/15   Dorothyann Gibbs, NP  simvastatin (ZOCOR) 20 MG tablet Take 20 mg by mouth every evening.    Historical Provider, MD  valACYclovir (VALTREX) 1000 MG tablet  01/29/15   Historical Provider, MD  vitamin C (ASCORBIC ACID) 500 MG tablet Take 500 mg by mouth daily.    Historical Provider, MD  Zinc Oxide (DESITIN) 40 % PSTE Apply topically. 02/06/15   Historical Provider, MD     Results for orders placed or performed during the hospital encounter of 03/30/15 (from the past 48 hour(s))  CBC with Differential/Platelet     Status: Abnormal   Collection Time: 03/30/15 11:53 PM  Result Value Ref Range   WBC 9.8 4.0 - 10.5 K/uL   RBC 5.92 (H) 3.87 - 5.11 MIL/uL   Hemoglobin 13.1 12.0 - 15.0 g/dL   HCT 39.6 36.0 - 46.0 %   MCV 66.9 (L) 78.0 - 100.0 fL   MCH 22.1 (L) 26.0 - 34.0 pg   MCHC 33.1 30.0 - 36.0 g/dL   RDW 13.9 11.5 - 15.5 %   Platelets 228 150 - 400 K/uL   Neutrophils Relative % 88 %   Neutro Abs 8.6 (H) 1.7 - 7.7 K/uL   Lymphocytes Relative 8 %   Lymphs Abs 0.8 0.7 - 4.0 K/uL   Monocytes Relative 4 %   Monocytes Absolute 0.4 0.1 - 1.0 K/uL   Eosinophils Relative 0 %   Eosinophils Absolute 0.0 0.0 - 0.7 K/uL   Basophils Relative 0 %   Basophils Absolute 0.0 0.0 - 0.1 K/uL   Smear Review MORPHOLOGY UNREMARKABLE   I-stat chem 8, ed     Status: Abnormal   Collection Time: 03/31/15 12:03 AM  Result Value Ref Range   Sodium 132 (L) 135 - 145 mmol/L   Potassium 3.4 (L) 3.5 - 5.1 mmol/L   Chloride  94 (L) 101 - 111 mmol/L   BUN 17 6 - 20 mg/dL   Creatinine, Ser 0.70 0.44 - 1.00 mg/dL   Glucose, Bld 159 (H) 65 - 99 mg/dL   Calcium, Ion 1.06 (L) 1.13 - 1.30 mmol/L   TCO2 24 0 - 100 mmol/L   Hemoglobin 14.6 12.0 - 15.0  g/dL   HCT 43.0 36.0 - 46.0 %  Urinalysis, Routine w reflex microscopic (not at [facility])     Status: None   Collection Time: 03/31/15  2:33 AM  Result Value Ref Range   Color, Urine YELLOW YELLOW   APPearance CLEAR CLEAR   Specific Gravity, Urine 1.023 1.005 - 1.030   pH 8.0 5.0 - 8.0   Glucose, UA NEGATIVE NEGATIVE mg/dL   Hgb urine dipstick NEGATIVE NEGATIVE   Bilirubin Urine NEGATIVE NEGATIVE   Ketones, ur NEGATIVE NEGATIVE mg/dL   Protein, ur NEGATIVE NEGATIVE mg/dL   Nitrite NEGATIVE NEGATIVE   Leukocytes, UA NEGATIVE NEGATIVE    Comment: MICROSCOPIC NOT DONE ON URINES WITH NEGATIVE PROTEIN, BLOOD, LEUKOCYTES, NITRITE, OR GLUCOSE <1000 mg/dL.    Dg Abd 1 View  03/31/2015  CLINICAL DATA:  NG tube placement. EXAM: ABDOMEN - 1 VIEW COMPARISON:  CT chest 03/31/2015 FINDINGS: Enteric tube with tip in the left upper quadrant consistent with location in the upper stomach. Residual contrast material in the stomach and small bowel. Residual contrast material in the bladder and urinary collecting systems. Surgical clips in the pelvis. IMPRESSION: Enteric tube tip is in the left upper quadrant consistent with location in the upper stomach. Electronically Signed   By: Lucienne Capers M.D.   On: 03/31/2015 04:17   Ct Abdomen Pelvis W Contrast  03/31/2015  CLINICAL DATA:  70 year old female with abdominal pain, nausea and vomiting EXAM: CT ABDOMEN AND PELVIS WITH CONTRAST TECHNIQUE: Multidetector CT imaging of the abdomen and pelvis was performed using the standard protocol following bolus administration of intravenous contrast. CONTRAST:  144m OMNIPAQUE IOHEXOL 300 MG/ML  SOLN COMPARISON:  Radiograph dated 03/31/2015 and CT dated 02/19/2011 FINDINGS: The visualized lung bases  are clear. No intra-abdominal free air. Small free fluid in the upper abdomen as well as within the pelvis. The liver, gallbladder, pancreas, spleen, adrenal glands appear unremarkable. There is stable appearing right renal upper pole irregularity and scarring, likely related to prior surgery. Grossly stable subcentimeter left renal interpolar hypodensity is not well characterized but likely represents a cyst. There is minimal fullness of the renal collecting systems. The visualized ureters and urinary bladder appear unremarkable. Hysterectomy. The terminal ileum is collapsed. There is abutment of the terminal ileum to the right pelvic sidewall surgical clips compatible with adhesions. There is also abutment of loops of small bowel to the surgical clips along the left pelvic side wall compatible with adhesions. There is a caliber change in the the bowel loops at the left pelvic surgical clips with dilatation of the small bowel proximal to this area compatible with small bowel obstruction. There is thickened appearance and fluid-filled loops of mid and distal small bowel proximal to this area which may be reactive and represent inflammation or superimposed infection enteritis. The colon is decompressed. The appendix is unremarkable. There is aortoiliac atherosclerotic disease. No portal venous gas identified. There is no adenopathy. Extensive surgical staples noted along the abdominal aorta and bilateral iliac chain, likely related to prior lymph node dissection. There is no adenopathy. The abdominal wall soft tissues appear unremarkable. There is degenerative changes of the spine. Stable appearing grade 2 L5-S1 anterolisthesis. No acute fracture. IMPRESSION: Small-bowel obstruction with transition zone along the left lateral pelvic sidewall due to adhesions of the distal small bowel to the left iliac chain surgical clips. Multiple thickened and fluid-filled loops of small bowel noted proximal to this area which may  represent reactive inflammation or superimposed infectious enteritis. Clinical correlation  is recommend. Electronically Signed   By: Anner Crete M.D.   On: 03/31/2015 02:26   Dg Abd Acute W/chest  03/31/2015  CLINICAL DATA:  19-year-old female with nausea vomiting and diarrhea and diffuse abdominal pain. EXAM: DG ABDOMEN ACUTE W/ 1V CHEST COMPARISON:  Chest radiograph dated 03/07/2014 and head CT dated 01/30/2015 FINDINGS: The lungs are clear. No pleural effusion or pneumothorax. The cardiac silhouette is within normal limits. No acute osseous pathology. There is no evidence of bowel obstruction. No free air or radiopaque calculi identified. Stop surgical clips noted along the distal aorta and iliac vessels. There is mild scoliosis of the thoracolumbar spine. No acute fracture. IMPRESSION: No acute intrathoracic, abdominal, or pelvic pathology. Electronically Signed   By: Anner Crete M.D.   On: 03/31/2015 00:43    Review of Systems  Constitutional: Positive for chills. Negative for fever and diaphoresis.  Respiratory: Negative for shortness of breath.   Cardiovascular: Negative for chest pain.  Gastrointestinal: Positive for nausea, vomiting, abdominal pain and diarrhea. Negative for blood in stool.  Genitourinary: Negative for dysuria and hematuria.   Blood pressure 188/75, pulse 69, temperature 98.2 F (36.8 C), temperature source Oral, resp. rate 18, height '5\' 5"'$  (1.651 m), weight 69.4 kg (153 lb), SpO2 98 %. Physical Exam  Constitutional: She appears well-developed and well-nourished. No distress.  HENT:  Head: Atraumatic.  NG tube in draining thin brown fluid.  Eyes: No scleral icterus.  Cardiovascular: Normal rate and regular rhythm.   GI: Soft. She exhibits distension. She exhibits no mass. There is no tenderness.  Midline scar present without palpable hernia.  Musculoskeletal: She exhibits edema.  Neurological: She is alert.  Skin: Skin is warm and dry.     Assessment/Plan: Small bowel obstruction most likely due to a combination of enteritis and adhesions in the left lower quadrant. Previous partial small bowel obstructions had been due to narrowing of the distal ileum.  Plan: Attempt nonoperative management with nasogastric tube decompression. Repeat x-rays tomorrow.  Katelynd Blauvelt J 03/31/2015, 8:40 AM

## 2015-03-31 NOTE — ED Notes (Signed)
Pt has refused to have NGT--- EDP was made aware.

## 2015-04-01 ENCOUNTER — Inpatient Hospital Stay (HOSPITAL_COMMUNITY): Payer: Medicare Other

## 2015-04-01 LAB — CBC
HCT: 36.8 % (ref 36.0–46.0)
Hemoglobin: 11.7 g/dL — ABNORMAL LOW (ref 12.0–15.0)
MCH: 22 pg — ABNORMAL LOW (ref 26.0–34.0)
MCHC: 31.8 g/dL (ref 30.0–36.0)
MCV: 69.2 fL — ABNORMAL LOW (ref 78.0–100.0)
Platelets: 234 10*3/uL (ref 150–400)
RBC: 5.32 MIL/uL — ABNORMAL HIGH (ref 3.87–5.11)
RDW: 14.4 % (ref 11.5–15.5)
WBC: 8 10*3/uL (ref 4.0–10.5)

## 2015-04-01 LAB — BASIC METABOLIC PANEL
Anion gap: 8 (ref 5–15)
BUN: 11 mg/dL (ref 6–20)
CO2: 26 mmol/L (ref 22–32)
Calcium: 8.7 mg/dL — ABNORMAL LOW (ref 8.9–10.3)
Chloride: 104 mmol/L (ref 101–111)
Creatinine, Ser: 0.72 mg/dL (ref 0.44–1.00)
GFR calc Af Amer: >60 mL/min (ref >60)
GFR calc non Af Amer: >60 mL/min (ref >60)
Glucose, Bld: 101 mg/dL — ABNORMAL HIGH (ref 65–99)
Potassium: 3.3 mmol/L — ABNORMAL LOW (ref 3.5–5.1)
Sodium: 138 mmol/L (ref 135–145)

## 2015-04-01 MED ORDER — POTASSIUM CHLORIDE 10 MEQ/100ML IV SOLN
10.0000 meq | INTRAVENOUS | Status: AC
  Administered 2015-04-01 (×2): 10 meq via INTRAVENOUS
  Filled 2015-04-01 (×2): qty 100

## 2015-04-01 NOTE — Progress Notes (Signed)
Subjective: Passing some gas.  Less distended.  Walking a lot.  Objective: Vital signs in last 24 hours: Temp:  [97.7 F (36.5 C)-99.2 F (37.3 C)] 98.5 F (36.9 C) (02/19 0556) Pulse Rate:  [70-79] 72 (02/19 0556) Resp:  [16] 16 (02/19 0556) BP: (126-171)/(67-80) 161/72 mmHg (02/19 0556) SpO2:  [95 %-100 %] 97 % (02/19 0556) Last BM Date: 03/30/15  Intake/Output from previous day: 02/18 0701 - 02/19 0700 In: 2436.7 [I.V.:2336.7; IV Piggyback:100] Out: 1460 [Urine:1300; Emesis/NG output:160] Intake/Output this shift: Total I/O In: 200 [IV Piggyback:200] Out: -   PE: General- In NAD Abdomen-soft, not tender, not distended, bowel sound present  Lab Results:   Recent Labs  03/30/15 2353 03/31/15 0003 04/01/15 0530  WBC 9.8  --  8.0  HGB 13.1 14.6 11.7*  HCT 39.6 43.0 36.8  PLT 228  --  234   BMET  Recent Labs  03/31/15 0003 04/01/15 0530  NA 132* 138  K 3.4* 3.3*  CL 94* 104  CO2  --  26  GLUCOSE 159* 101*  BUN 17 11  CREATININE 0.70 0.72  CALCIUM  --  8.7*   PT/INR No results for input(s): LABPROT, INR in the last 72 hours. Comprehensive Metabolic Panel:    Component Value Date/Time   NA 138 04/01/2015 0530   NA 132* 03/31/2015 0003   K 3.3* 04/01/2015 0530   K 3.4* 03/31/2015 0003   CL 104 04/01/2015 0530   CL 94* 03/31/2015 0003   CO2 26 04/01/2015 0530   CO2 26 03/07/2014 0238   BUN 11 04/01/2015 0530   BUN 17 03/31/2015 0003   CREATININE 0.72 04/01/2015 0530   CREATININE 0.70 03/31/2015 0003   GLUCOSE 101* 04/01/2015 0530   GLUCOSE 159* 03/31/2015 0003   CALCIUM 8.7* 04/01/2015 0530   CALCIUM 9.7 03/07/2014 0238   AST 31 03/07/2014 0238   AST 20 04/14/2013 2215   ALT 24 03/07/2014 0238   ALT 19 04/14/2013 2215   ALKPHOS 80 03/07/2014 0238   ALKPHOS 92 04/14/2013 2215   BILITOT 0.9 03/07/2014 0238   BILITOT 0.7 04/14/2013 2215   PROT 6.8 03/07/2014 0238   PROT 7.8 04/14/2013 2215   ALBUMIN 4.4 03/07/2014 0238   ALBUMIN 4.5  04/14/2013 2215     Studies/Results: Dg Abd 1 View  03/31/2015  CLINICAL DATA:  NG tube placement. EXAM: ABDOMEN - 1 VIEW COMPARISON:  CT chest 03/31/2015 FINDINGS: Enteric tube with tip in the left upper quadrant consistent with location in the upper stomach. Residual contrast material in the stomach and small bowel. Residual contrast material in the bladder and urinary collecting systems. Surgical clips in the pelvis. IMPRESSION: Enteric tube tip is in the left upper quadrant consistent with location in the upper stomach. Electronically Signed   By: Lucienne Capers M.D.   On: 03/31/2015 04:17   Ct Abdomen Pelvis W Contrast  03/31/2015  CLINICAL DATA:  70 year old female with abdominal pain, nausea and vomiting EXAM: CT ABDOMEN AND PELVIS WITH CONTRAST TECHNIQUE: Multidetector CT imaging of the abdomen and pelvis was performed using the standard protocol following bolus administration of intravenous contrast. CONTRAST:  173m OMNIPAQUE IOHEXOL 300 MG/ML  SOLN COMPARISON:  Radiograph dated 03/31/2015 and CT dated 02/19/2011 FINDINGS: The visualized lung bases are clear. No intra-abdominal free air. Small free fluid in the upper abdomen as well as within the pelvis. The liver, gallbladder, pancreas, spleen, adrenal glands appear unremarkable. There is stable appearing right renal upper pole irregularity and scarring, likely  related to prior surgery. Grossly stable subcentimeter left renal interpolar hypodensity is not well characterized but likely represents a cyst. There is minimal fullness of the renal collecting systems. The visualized ureters and urinary bladder appear unremarkable. Hysterectomy. The terminal ileum is collapsed. There is abutment of the terminal ileum to the right pelvic sidewall surgical clips compatible with adhesions. There is also abutment of loops of small bowel to the surgical clips along the left pelvic side wall compatible with adhesions. There is a caliber change in the the  bowel loops at the left pelvic surgical clips with dilatation of the small bowel proximal to this area compatible with small bowel obstruction. There is thickened appearance and fluid-filled loops of mid and distal small bowel proximal to this area which may be reactive and represent inflammation or superimposed infection enteritis. The colon is decompressed. The appendix is unremarkable. There is aortoiliac atherosclerotic disease. No portal venous gas identified. There is no adenopathy. Extensive surgical staples noted along the abdominal aorta and bilateral iliac chain, likely related to prior lymph node dissection. There is no adenopathy. The abdominal wall soft tissues appear unremarkable. There is degenerative changes of the spine. Stable appearing grade 2 L5-S1 anterolisthesis. No acute fracture. IMPRESSION: Small-bowel obstruction with transition zone along the left lateral pelvic sidewall due to adhesions of the distal small bowel to the left iliac chain surgical clips. Multiple thickened and fluid-filled loops of small bowel noted proximal to this area which may represent reactive inflammation or superimposed infectious enteritis. Clinical correlation is recommend. Electronically Signed   By: Anner Crete M.D.   On: 03/31/2015 02:26   Dg Abd Acute W/chest  03/31/2015  CLINICAL DATA:  63-year-old female with nausea vomiting and diarrhea and diffuse abdominal pain. EXAM: DG ABDOMEN ACUTE W/ 1V CHEST COMPARISON:  Chest radiograph dated 03/07/2014 and head CT dated 01/30/2015 FINDINGS: The lungs are clear. No pleural effusion or pneumothorax. The cardiac silhouette is within normal limits. No acute osseous pathology. There is no evidence of bowel obstruction. No free air or radiopaque calculi identified. Stop surgical clips noted along the distal aorta and iliac vessels. There is mild scoliosis of the thoracolumbar spine. No acute fracture. IMPRESSION: No acute intrathoracic, abdominal, or pelvic  pathology. Electronically Signed   By: Anner Crete M.D.   On: 03/31/2015 00:43    Anti-infectives: Anti-infectives    None      Assessment Principal Problem:   Small bowel obstruction (HCC)-x-ray this AM demonstrates contrast in colon and starting to pass gas =>clinically and radiographically better. Active Problems:       LOS: 1 day   Plan: Clamp ng tube.  Clear liquids.  Remove ng tube later today if liquids tolerated.  I advised her to avoid raw fruits and vegetables for the near future.   [name] Richards J 04/01/2015

## 2015-04-01 NOTE — Progress Notes (Signed)
[facility] PROGRESS NOTE  ELIANNA Richards LTJ:[id number] DOB: 03/06/1945 DOA: 03/30/2015 PCP: Vidal Schwalbe, MD  Assessment/Plan: 1. Small bowel obstruction. -[name] Richards is a pleasant 70 year old female with a history of cervical cancer status post radiation therapy in 1997. She has a history of small bowel obstruction secondary to adhesions. -Presented with complaints of nausea, vomiting, abdominal distention. Initial workup included a CT scan of abdomen and pelvis that showed small bowel obstruction due to adhesions. -Gen. surgery consulted -She was made nothing by mouth with placement of NG tube and started on IV fluids. -Having positive bowel sounds today, abdomen less distended, looks better -Pending a.m. abdominal film.   2.  Hypothyroidism. -She is on Synthroid 75 g by mouth daily at home, transitioned to IV Synthroid since she is nothing by mouth with NG tube  3. History of hypertension -She had been on hydrochlorothiazide/Lotensin 20/12.5 by mouth daily at home which has been discontinued due to nothing by mouth status. -Like to continue as needed hydralazine 10 mg IV every 4 hours as needed  4.  Hypokalemia -Left showing potassium of 3.3. Likely secondary to GI losses -Will replaced with IV potassium  Code Status: Full code Family Communication:  Disposition Plan:    Consultants:  General surgery  Procedures:  Status post NG tube placement  HPI/Subjective: [name] Richards is a pleasant 70 year old female with a past medical history of cervical cancer status post radiation therapy in 1997, history of small bowel obstruction, admitted to the medicine service on 03/31/2015 when she presented with complaints of abdominal pain associated with distention, nausea and vomiting. Initial workup included a CT scan of abdomen and pelvis that showed small bowel obstruction along the left lateral pelvic sidewall due to adhesions of the distal small bowel. She was seen and  evaluated by general surgery. She was made nothing by mouth with placement of NG tube. Started on IV fluids.  Objective: Filed Vitals:   03/31/15 2123 04/01/15 0556  BP: 126/72 161/72  Pulse: 75 72  Temp: 97.7 F (36.5 C) 98.5 F (36.9 C)  Resp: 16 16    Intake/Output Summary (Last 24 hours) at 04/01/15 0741 Last data filed at 04/01/15 0739  Gross per 24 hour  Intake 2536.67 ml  Output   1460 ml  Net 1076.67 ml   Filed Weights   03/30/15 2313  Weight: 69.4 kg (153 lb)    Exam:   General:  Calm, pleasant, cooperative in no acute distress, NG tube in place  Cardiovascular: Regular rate rhythm normal S1-S2  Respiratory: Normal respiratory effort lungs are clear  Abdomen: Abdomen is distended mild generalized tenderness to palpation  Musculoskeletal: No edema  Data Reviewed: Basic Metabolic Panel:  Recent Labs Lab 03/31/15 0003 04/01/15 0530  NA 132* 138  K 3.4* 3.3*  CL 94* 104  CO2  --  26  GLUCOSE 159* 101*  BUN 17 11  CREATININE 0.70 0.72  CALCIUM  --  8.7*   Liver Function Tests: No results for input(s): AST, ALT, ALKPHOS, BILITOT, PROT, ALBUMIN in the last 168 hours. No results for input(s): LIPASE, AMYLASE in the last 168 hours. No results for input(s): AMMONIA in the last 168 hours. CBC:  Recent Labs Lab 03/30/15 2353 03/31/15 0003 04/01/15 0530  WBC 9.8  --  8.0  NEUTROABS 8.6*  --   --   HGB 13.1 14.6 11.7*  HCT 39.6 43.0 36.8  MCV 66.9*  --  69.2*  PLT 228  --  234  Cardiac Enzymes: No results for input(s): CKTOTAL, CKMB, CKMBINDEX, TROPONINI in the last 168 hours. BNP (last 3 results) No results for input(s): BNP in the last 8760 hours.  ProBNP (last 3 results) No results for input(s): PROBNP in the last 8760 hours.  CBG: No results for input(s): GLUCAP in the last 168 hours.  No results found for this or any previous visit (from the past 240 hour(s)).   Studies: Dg Abd 1 View  03/31/2015  CLINICAL DATA:  NG tube  placement. EXAM: ABDOMEN - 1 VIEW COMPARISON:  CT chest 03/31/2015 FINDINGS: Enteric tube with tip in the left upper quadrant consistent with location in the upper stomach. Residual contrast material in the stomach and small bowel. Residual contrast material in the bladder and urinary collecting systems. Surgical clips in the pelvis. IMPRESSION: Enteric tube tip is in the left upper quadrant consistent with location in the upper stomach. Electronically Signed   By: Lucienne Capers M.D.   On: 03/31/2015 04:17   Ct Abdomen Pelvis W Contrast  03/31/2015  CLINICAL DATA:  69 year old female with abdominal pain, nausea and vomiting EXAM: CT ABDOMEN AND PELVIS WITH CONTRAST TECHNIQUE: Multidetector CT imaging of the abdomen and pelvis was performed using the standard protocol following bolus administration of intravenous contrast. CONTRAST:  13m OMNIPAQUE IOHEXOL 300 MG/ML  SOLN COMPARISON:  Radiograph dated 03/31/2015 and CT dated 02/19/2011 FINDINGS: The visualized lung bases are clear. No intra-abdominal free air. Small free fluid in the upper abdomen as well as within the pelvis. The liver, gallbladder, pancreas, spleen, adrenal glands appear unremarkable. There is stable appearing right renal upper pole irregularity and scarring, likely related to prior surgery. Grossly stable subcentimeter left renal interpolar hypodensity is not well characterized but likely represents a cyst. There is minimal fullness of the renal collecting systems. The visualized ureters and urinary bladder appear unremarkable. Hysterectomy. The terminal ileum is collapsed. There is abutment of the terminal ileum to the right pelvic sidewall surgical clips compatible with adhesions. There is also abutment of loops of small bowel to the surgical clips along the left pelvic side wall compatible with adhesions. There is a caliber change in the the bowel loops at the left pelvic surgical clips with dilatation of the small bowel proximal to this  area compatible with small bowel obstruction. There is thickened appearance and fluid-filled loops of mid and distal small bowel proximal to this area which may be reactive and represent inflammation or superimposed infection enteritis. The colon is decompressed. The appendix is unremarkable. There is aortoiliac atherosclerotic disease. No portal venous gas identified. There is no adenopathy. Extensive surgical staples noted along the abdominal aorta and bilateral iliac chain, likely related to prior lymph node dissection. There is no adenopathy. The abdominal wall soft tissues appear unremarkable. There is degenerative changes of the spine. Stable appearing grade 2 L5-S1 anterolisthesis. No acute fracture. IMPRESSION: Small-bowel obstruction with transition zone along the left lateral pelvic sidewall due to adhesions of the distal small bowel to the left iliac chain surgical clips. Multiple thickened and fluid-filled loops of small bowel noted proximal to this area which may represent reactive inflammation or superimposed infectious enteritis. Clinical correlation is recommend. Electronically Signed   By: AAnner CreteM.D.   On: 03/31/2015 02:26   Dg Abd Acute W/chest  03/31/2015  CLINICAL DATA:  70year-old female with nausea vomiting and diarrhea and diffuse abdominal pain. EXAM: DG ABDOMEN ACUTE W/ 1V CHEST COMPARISON:  Chest radiograph dated 03/07/2014 and head CT dated 01/30/2015  FINDINGS: The lungs are clear. No pleural effusion or pneumothorax. The cardiac silhouette is within normal limits. No acute osseous pathology. There is no evidence of bowel obstruction. No free air or radiopaque calculi identified. Stop surgical clips noted along the distal aorta and iliac vessels. There is mild scoliosis of the thoracolumbar spine. No acute fracture. IMPRESSION: No acute intrathoracic, abdominal, or pelvic pathology. Electronically Signed   By: Anner Crete M.D.   On: 03/31/2015 00:43    Scheduled  Meds: . antiseptic oral rinse  7 mL Mouth Rinse q12n4p  . chlorhexidine  15 mL Mouth Rinse BID  . enoxaparin (LOVENOX) injection  40 mg Subcutaneous Q24H  . levothyroxine  25 mcg Intravenous Daily  . potassium chloride  10 mEq Intravenous Q1 Hr x 2  . silver sulfADIAZINE  1 application Topical BID   Continuous Infusions: . sodium chloride 100 mL/hr at 03/31/15 2026    Principal Problem:   Small bowel obstruction (HCC) Active Problems:   Essential hypertension   Hypokalemia   Hypothyroidism    Time spent:     Kelvin Cellar  [facility] Pager [phone]. If 7PM-7AM, please contact night-coverage at [email or web address], password [facility] 04/01/2015, 7:41 AM  LOS: 1 day

## 2015-04-02 MED ORDER — DOCUSATE SODIUM 100 MG PO CAPS
100.0000 mg | ORAL_CAPSULE | Freq: Two times a day (BID) | ORAL | Status: DC
  Administered 2015-04-02 – 2015-04-04 (×4): 100 mg via ORAL
  Filled 2015-04-02 (×6): qty 1

## 2015-04-02 MED ORDER — BENAZEPRIL-HYDROCHLOROTHIAZIDE 20-12.5 MG PO TABS: 1.0000 | ORAL_TABLET | Freq: Every day | ORAL | Status: DC

## 2015-04-02 MED ORDER — CYCLOBENZAPRINE HCL 5 MG PO TABS
5.0000 mg | ORAL_TABLET | Freq: Three times a day (TID) | ORAL | Status: DC | PRN
  Administered 2015-04-02 – 2015-04-03 (×2): 5 mg via ORAL
  Filled 2015-04-02 (×2): qty 1

## 2015-04-02 MED ORDER — HYDROCHLOROTHIAZIDE 12.5 MG PO CAPS
12.5000 mg | ORAL_CAPSULE | Freq: Every day | ORAL | Status: DC
  Administered 2015-04-02: 12.5 mg via ORAL
  Filled 2015-04-02 (×2): qty 1

## 2015-04-02 MED ORDER — CYCLOBENZAPRINE HCL 10 MG PO TABS
10.0000 mg | ORAL_TABLET | Freq: Once | ORAL | Status: AC
  Administered 2015-04-02: 10 mg via ORAL
  Filled 2015-04-02: qty 1

## 2015-04-02 MED ORDER — LEVOTHYROXINE SODIUM 75 MCG PO TABS
75.0000 ug | ORAL_TABLET | Freq: Every day | ORAL | Status: DC
  Administered 2015-04-02 – 2015-04-04 (×3): 75 ug via ORAL
  Filled 2015-04-02 (×5): qty 1

## 2015-04-02 MED ORDER — BENAZEPRIL HCL 20 MG PO TABS
20.0000 mg | ORAL_TABLET | Freq: Every day | ORAL | Status: DC
  Administered 2015-04-02: 20 mg via ORAL
  Filled 2015-04-02 (×2): qty 1

## 2015-04-02 MED ORDER — SIMVASTATIN 20 MG PO TABS
20.0000 mg | ORAL_TABLET | Freq: Every evening | ORAL | Status: DC
  Administered 2015-04-02 – 2015-04-03 (×2): 20 mg via ORAL
  Filled 2015-04-02 (×3): qty 1

## 2015-04-02 NOTE — Care Management Note (Signed)
Case Management Note  Patient Details  Name: [name] Richards MRN: [id number] Date of Birth: 10/09/1945  Subjective/Objective:     Admitted with SBO               Action/Plan: Discharge planning, no HH needs identified  Expected Discharge Date:                  Expected Discharge Plan:  Home/Self Care  In-House Referral:  NA  Discharge planning Services  CM Consult  Post Acute Care Choice:  NA Choice offered to:  NA  DME Arranged:  N/A DME Agency:  NA  HH Arranged:  NA HH Agency:  NA  Status of Service:  Completed, signed off  Medicare Important Message Given:    Date Medicare IM Given:    Medicare IM give by:    Date Additional Medicare IM Given:    Additional Medicare Important Message give by:     If discussed at [place] of Stay Meetings, dates discussed:    Additional Comments:  Guadalupe Maple, RN 04/02/2015, 10:27 AM [phone]

## 2015-04-02 NOTE — Progress Notes (Signed)
Patient ID: [name] Richards, female   DOB: 07-Nov-1945, 70 y.o.   MRN: [id number]    Subjective: No abdominal complaints today.  Tolerating clear liquids without nausea or bloating. He has had flatus but no bowel movements. No abdominal pain. She is having some left shoulder pain, thinks she may have slept on her shoulder.  Objective: Vital signs in last 24 hours: Temp:  [98.2 F (36.8 C)-98.6 F (37 C)] 98.2 F (36.8 C) (02/20 0458) Pulse Rate:  [60-65] 65 (02/20 0458) Resp:  [16] 16 (02/20 0458) BP: (161-177)/(73-81) 177/81 mmHg (02/20 0458) SpO2:  [97 %-100 %] 97 % (02/20 0458) Last BM Date: 03/30/15  Intake/Output from previous day: 2022-04-17 0701 - 02/20 0700 In: 2920 [P.O.:720; I.V.:2000; IV Piggyback:200] Out: 1400 [Urine:1400] Intake/Output this shift:    General appearance: alert, cooperative and no distress GI: normal findings: soft, non-tender and nondistended  Lab Results:   Recent Labs  03/30/15 2353 03/31/15 0003 04-18-2015 0530  WBC 9.8  --  8.0  HGB 13.1 14.6 11.7*  HCT 39.6 43.0 36.8  PLT 228  --  234   BMET  Recent Labs  03/31/15 0003 04/18/2015 0530  NA 132* 138  K 3.4* 3.3*  CL 94* 104  CO2  --  26  GLUCOSE 159* 101*  BUN 17 11  CREATININE 0.70 0.72  CALCIUM  --  8.7*     Studies/Results: Dg Abd 2 Views  2015/04/18  CLINICAL DATA:  Follow-up small bowel obstruction. EXAM: ABDOMEN - 2 VIEW COMPARISON:  Yesterday FINDINGS: Nasogastric tube tip and side hole in the proximal stomach. Interval dilated loop of jejunum in the left upper abdomen. Oral contrast in normal caliber colon. Gas in normal caliber distal small bowel loops. Bilateral pelvic and lower lumbar spine region surgical clips. No acute bony abnormality. No free peritoneal air. IMPRESSION: Interval visualization dilated jejunum in the left upper abdomen, similar to that seen on the CT obtained yesterday, compatible partial small bowel obstruction. Electronically Signed   By: Claudie Revering M.D.    On: 2015-04-18 10:51    Anti-infectives: Anti-infectives    None      Assessment/Plan: Partial small bowel obstruction, history of extensive pelvic surgery and radiation. Clinically  Improved. Advance to full liquid diet.    LOS: 2 days    Teryn Boerema T 04/02/2015

## 2015-04-02 NOTE — Progress Notes (Signed)
[facility] PROGRESS NOTE  [name] Richards NGE:[id number] DOB: June 08, 1945 DOA: 03/30/2015 PCP: Vidal Schwalbe, MD  Assessment/Plan: 1. Small bowel obstruction. -[name] Richards is a pleasant 70 year old female with a history of cervical cancer status post radiation therapy in 1997. She has a history of small bowel obstruction secondary to adhesions. -Presented with complaints of nausea, vomiting, abdominal distention. Initial workup included a CT scan of abdomen and pelvis that showed small bowel obstruction due to adhesions. -Gen. surgery consulted -She was made nothing by mouth with placement of NG tube and started on IV fluids. -On 04/02/2015 still hasn't had a bowel movement, although she is looking better. Her diet was advanced.   2.  Hypothyroidism. -She is on Synthroid 75 g by mouth daily at home, transitioned to IV Synthroid since she is nothing by mouth with NG tube -On 04/02/2015 she was placed back on her home dose of synthroid at 75 mcg PO q daily  3. History of hypertension -She was placed back on her home regimen of hydrochlorothiazide/Lotensin 20/12.5   4.  Hypokalemia -Left showing potassium of 3.3. Likely secondary to GI losses -Replaced with IV potassium -Repeat labs in am  Code Status: Full code Family Communication:  Disposition Plan: Anticipate discharge home in the next 24-48 hours.    Consultants:  General surgery  Procedures:  Status post NG tube placement  HPI/Subjective: [name] Richards is a pleasant 70 year old female with a past medical history of cervical cancer status post radiation therapy in 1997, history of small bowel obstruction, admitted to the medicine service on 03/31/2015 when she presented with complaints of abdominal pain associated with distention, nausea and vomiting. Initial workup included a CT scan of abdomen and pelvis that showed small bowel obstruction along the left lateral pelvic sidewall due to adhesions of the distal small bowel. She  was seen and evaluated by general surgery. She was made nothing by mouth with placement of NG tube. Started on IV fluids.  Objective: Filed Vitals:   04/01/15 2142 04/02/15 0458  BP: 167/76 177/81  Pulse: 60 65  Temp: 98.6 F (37 C) 98.2 F (36.8 C)  Resp: 16 16    Intake/Output Summary (Last 24 hours) at 04/02/15 1436 Last data filed at 04/02/15 1212  Gross per 24 hour  Intake 3766.67 ml  Output   2450 ml  Net 1316.67 ml   Filed Weights   03/30/15 2313  Weight: 69.4 kg (153 lb)    Exam:   General:  Calm, pleasant, cooperative in no acute distress, NG tube in place  Cardiovascular: Regular rate rhythm normal S1-S2  Respiratory: Normal respiratory effort lungs are clear  Abdomen: Abdomen is distended mild generalized tenderness to palpation  Musculoskeletal: No edema  Data Reviewed: Basic Metabolic Panel:  Recent Labs Lab 03/31/15 0003 04/01/15 0530  NA 132* 138  K 3.4* 3.3*  CL 94* 104  CO2  --  26  GLUCOSE 159* 101*  BUN 17 11  CREATININE 0.70 0.72  CALCIUM  --  8.7*   Liver Function Tests: No results for input(s): AST, ALT, ALKPHOS, BILITOT, PROT, ALBUMIN in the last 168 hours. No results for input(s): LIPASE, AMYLASE in the last 168 hours. No results for input(s): AMMONIA in the last 168 hours. CBC:  Recent Labs Lab 03/30/15 2353 03/31/15 0003 04/01/15 0530  WBC 9.8  --  8.0  NEUTROABS 8.6*  --   --   HGB 13.1 14.6 11.7*  HCT 39.6 43.0 36.8  MCV 66.9*  --  69.2*  PLT 228  --  234   Cardiac Enzymes: No results for input(s): CKTOTAL, CKMB, CKMBINDEX, TROPONINI in the last 168 hours. BNP (last 3 results) No results for input(s): BNP in the last 8760 hours.  ProBNP (last 3 results) No results for input(s): PROBNP in the last 8760 hours.  CBG: No results for input(s): GLUCAP in the last 168 hours.  No results found for this or any previous visit (from the past 240 hour(s)).   Studies: Dg Abd 2 Views  04/01/2015  CLINICAL DATA:   Follow-up small bowel obstruction. EXAM: ABDOMEN - 2 VIEW COMPARISON:  Yesterday FINDINGS: Nasogastric tube tip and side hole in the proximal stomach. Interval dilated loop of jejunum in the left upper abdomen. Oral contrast in normal caliber colon. Gas in normal caliber distal small bowel loops. Bilateral pelvic and lower lumbar spine region surgical clips. No acute bony abnormality. No free peritoneal air. IMPRESSION: Interval visualization dilated jejunum in the left upper abdomen, similar to that seen on the CT obtained yesterday, compatible partial small bowel obstruction. Electronically Signed   By: Claudie Revering M.D.   On: 04/01/2015 10:51    Scheduled Meds: . antiseptic oral rinse  7 mL Mouth Rinse q12n4p  . benazepril  20 mg Oral Daily   And  . hydrochlorothiazide  12.5 mg Oral Daily  . chlorhexidine  15 mL Mouth Rinse BID  . docusate sodium  100 mg Oral BID  . enoxaparin (LOVENOX) injection  40 mg Subcutaneous Q24H  . levothyroxine  75 mcg Oral QAC breakfast  . silver sulfADIAZINE  1 application Topical BID  . simvastatin  20 mg Oral QPM   Continuous Infusions:    Principal Problem:   Small bowel obstruction (HCC) Active Problems:   Essential hypertension   Hypokalemia   Hypothyroidism    Time spent: 25 min    Kelvin Cellar  [facility] Pager [phone]. If 7PM-7AM, please contact night-coverage at [email or web address], password [facility] 04/02/2015, 2:36 PM  LOS: 2 days

## 2015-04-02 NOTE — Plan of Care (Signed)
Problem: Food- and Nutrition-Related Knowledge Deficit (NB-1.1) Goal: Nutrition education Formal process to instruct or train a patient/client in a skill or to impart knowledge to help patients/clients voluntarily manage or modify food choices and eating behavior to maintain or improve health. Outcome: Completed/Met Date Met:  04/02/15  RD consulted for education regarding Low fiber nutrition therapy. Pt with recurrent SBO.  RD provided "Low Fiber Nutrition Therapy" handout from the Academy of [employer]. Reviewed patient's dietary recall. Reviewed high and low fiber foods. Encouraged pt to consume a diet lower in fiber. Encouraged use of a chewable multivitamin when following a low fiber diet. Discouraged consumption of red meats. Patient with many questions. Teach back method used.  Expect fair compliance. Pt resistant to change her diet, has been following a high fiber diet consisting of nuts, seeds, and fruits with skins.  Body mass index is 25.46 kg/(m^2). Pt meets criteria for overweight based on current BMI.  Current diet order is full liquid, patient is consuming approximately 100% of meals at this time. Labs and medications reviewed. No further nutrition interventions warranted at this time. If additional nutrition issues arise, please re-consult RD.   Clayton Bibles, MS, RD, LDN Pager: [phone] After Hours Pager: [phone]

## 2015-04-03 ENCOUNTER — Encounter: Payer: Self-pay | Admitting: Gynecology

## 2015-04-03 LAB — BASIC METABOLIC PANEL
Anion gap: 8 (ref 5–15)
BUN: 7 mg/dL (ref 6–20)
CO2: 27 mmol/L (ref 22–32)
Calcium: 8.9 mg/dL (ref 8.9–10.3)
Chloride: 100 mmol/L — ABNORMAL LOW (ref 101–111)
Creatinine, Ser: 0.86 mg/dL (ref 0.44–1.00)
GFR calc Af Amer: >60 mL/min (ref >60)
GFR calc non Af Amer: >60 mL/min (ref >60)
Glucose, Bld: 116 mg/dL — ABNORMAL HIGH (ref 65–99)
Potassium: 3.2 mmol/L — ABNORMAL LOW (ref 3.5–5.1)
Sodium: 135 mmol/L (ref 135–145)

## 2015-04-03 LAB — CBC
HCT: 31.6 % — ABNORMAL LOW (ref 36.0–46.0)
Hemoglobin: 10.2 g/dL — ABNORMAL LOW (ref 12.0–15.0)
MCH: 22.5 pg — ABNORMAL LOW (ref 26.0–34.0)
MCHC: 32.3 g/dL (ref 30.0–36.0)
MCV: 69.6 fL — ABNORMAL LOW (ref 78.0–100.0)
Platelets: 182 10*3/uL (ref 150–400)
RBC: 4.54 MIL/uL (ref 3.87–5.11)
RDW: 14.2 % (ref 11.5–15.5)
WBC: 4.5 10*3/uL (ref 4.0–10.5)

## 2015-04-03 MED ORDER — POTASSIUM CHLORIDE CRYS ER 20 MEQ PO TBCR
40.0000 meq | EXTENDED_RELEASE_TABLET | Freq: Four times a day (QID) | ORAL | Status: AC
  Administered 2015-04-03 (×2): 40 meq via ORAL
  Filled 2015-04-03 (×2): qty 2

## 2015-04-03 MED ORDER — HYDROCHLOROTHIAZIDE 12.5 MG PO CAPS
12.5000 mg | ORAL_CAPSULE | Freq: Every day | ORAL | Status: DC
  Administered 2015-04-03 – 2015-04-04 (×2): 12.5 mg via ORAL
  Filled 2015-04-03 (×3): qty 1

## 2015-04-03 MED ORDER — BENAZEPRIL HCL 20 MG PO TABS
20.0000 mg | ORAL_TABLET | Freq: Every day | ORAL | Status: DC
  Administered 2015-04-03 – 2015-04-04 (×2): 20 mg via ORAL
  Filled 2015-04-03 (×3): qty 1

## 2015-04-03 NOTE — Care Management Important Message (Signed)
Important Message  Patient Details  Name: [name] Richards MRN: [id number] Date of Birth: Apr 08, 1945   Medicare Important Message Given:  Yes    Camillo Flaming 04/03/2015, 1:46 PMImportant Message  Patient Details  Name: [name] Richards MRN: [id number] Date of Birth: [birth year]   Medicare Important Message Given:  Yes    Camillo Flaming 04/03/2015, 1:46 PM

## 2015-04-03 NOTE — Progress Notes (Signed)
Patient ID: [name] Richards, female   DOB: 03-21-1945, 70 y.o.   MRN: [id number]     [facility]      [address] [place], [place] [address]    Phone: [phone] FAX: [phone]     Subjective: Tolerating fulls.  Passing flatus.  No BM.  No n/v or pain.   Objective:  Vital signs:  Filed Vitals:   04/01/15 2142 04/02/15 0458 04/02/15 2112 04/03/15 0600  BP: 167/76 177/81 158/84 149/75  Pulse: 60 65 61 67  Temp: 98.6 F (37 C) 98.2 F (36.8 C) 98.8 F (37.1 C) 98.7 F (37.1 C)  TempSrc: Oral Oral  Oral  Resp: 16 16 18 18   Height:      Weight:      SpO2: 98% 97% 97% 96%    Last BM Date: 03/30/15  Intake/Output   Yesterday:  02/20 0701 - 02/21 0700 In: 3346.7 [P.O.:2940; I.V.:406.7] Out: 3600 [Urine:3600] This shift: I/O last 3 completed shifts: In: 4546.7 [P.O.:2940; I.V.:1606.7] Out: 3600 [Urine:3600]    Physical Exam: General: Pt awake/alert/oriented x4 in no  acute distress  Abdomen: Soft.  Nondistended.  Non tender.  No evidence of peritonitis.  No incarcerated hernias.    Problem List:   Principal Problem:   Small bowel obstruction (HCC) Active Problems:   Essential hypertension   Hypokalemia   Hypothyroidism    Results:   Labs: Results for orders placed or performed during the hospital encounter of 03/30/15 (from the past 48 hour(s))  Basic metabolic panel     Status: Abnormal   Collection Time: 04/03/15  4:41 AM  Result Value Ref Range   Sodium 135 135 - 145 mmol/L   Potassium 3.2 (L) 3.5 - 5.1 mmol/L   Chloride 100 (L) 101 - 111 mmol/L   CO2 27 22 - 32 mmol/L   Glucose, Bld 116 (H) 65 - 99 mg/dL   BUN 7 6 - 20 mg/dL   Creatinine, Ser 0.86 0.44 - 1.00 mg/dL   Calcium 8.9 8.9 - 10.3 mg/dL   GFR calc non Af Amer >60 >60 mL/min   GFR calc Af Amer >60 >60 mL/min    Comment: (NOTE) The eGFR has been calculated using the CKD EPI equation. This calculation has not been validated in all  clinical situations. eGFR's persistently <60 mL/min signify possible Chronic Kidney Disease.    Anion gap 8 5 - 15  CBC     Status: Abnormal   Collection Time: 04/03/15  4:41 AM  Result Value Ref Range   WBC 4.5 4.0 - 10.5 K/uL   RBC 4.54 3.87 - 5.11 MIL/uL   Hemoglobin 10.2 (L) 12.0 - 15.0 g/dL   HCT 31.6 (L) 36.0 - 46.0 %   MCV 69.6 (L) 78.0 - 100.0 fL   MCH 22.5 (L) 26.0 - 34.0 pg   MCHC 32.3 30.0 - 36.0 g/dL   RDW 14.2 11.5 - 15.5 %   Platelets 182 150 - 400 K/uL    Imaging / Studies: No results found.  Medications / Allergies:  Scheduled Meds: . antiseptic oral rinse  7 mL Mouth Rinse q12n4p  . hydrochlorothiazide  12.5 mg Oral Daily   And  . benazepril  20 mg Oral Daily  . chlorhexidine  15 mL Mouth Rinse BID  . docusate sodium  100 mg Oral BID  . enoxaparin (LOVENOX) injection  40 mg Subcutaneous Q24H  . levothyroxine  75 mcg Oral QAC breakfast  .  silver sulfADIAZINE  1 application Topical BID  . simvastatin  20 mg Oral QPM   Continuous Infusions:  PRN Meds:.albuterol, cyclobenzaprine, hydrALAZINE, LORazepam, menthol-cetylpyridinium, morphine injection, ondansetron **OR** ondansetron (ZOFRAN) IV, phenol  Antibiotics: Anti-infectives    None        Assessment/Plan pSBO-resolving, advance to soft diet. Benign exam.  No BM, having flatus.  Mobilize.  Will d/w Dr. Excell Seltzer if okay to DC later today.   Erby Pian, [facility] Pager [phone](7A-4:30P)   04/03/2015 9:38 AM

## 2015-04-03 NOTE — Progress Notes (Signed)
[facility] PROGRESS NOTE  [name] Richards OQH:[id number] DOB: [birth year] DOA: 03/30/2015 PCP: Vidal Schwalbe, MD  Interim Summary [name] Richards is a pleasant 70 year old female with a past medical history of cervical cancer status post radiation therapy in 1997, history of small bowel obstruction, admitted to the medicine service on 03/31/2015 when she presented with complaints of abdominal pain associated with distention, nausea and vomiting. Initial workup included a CT scan of abdomen and pelvis that showed small bowel obstruction along the left lateral pelvic sidewall due to adhesions of the distal small bowel. She was seen and evaluated by general surgery. She was made nothing by mouth with placement of NG tube. Started on IV fluids. She showed gradual clinical improvement over the course of her hospitalization. Her NG tube was removed as her diet is slowly being advanced. Anticipate discharge home  In the next 24 hours.   Assessment/Plan: 1. Small bowel obstruction. -[name] Buhrman is a pleasant 70 year old female with a history of cervical cancer status post radiation therapy in 1997. She has a history of small bowel obstruction secondary to adhesions. -Presented with complaints of nausea, vomiting, abdominal distention. Initial workup included a CT scan of abdomen and pelvis that showed small bowel obstruction due to adhesions. -Gen. surgery consulted -Showing clinical improvement, tolerating PO intake, has positive bowel sounds on exam. Still hasn't had bowel movement.   2.  Hypothyroidism. -She is on Synthroid 75 g by mouth daily at home, transitioned to IV Synthroid since she is nothing by mouth with NG tube -On 04/02/2015 she was placed back on her home dose of synthroid at 75 mcg PO q daily  3. History of hypertension -She was placed back on her home regimen of hydrochlorothiazide/Lotensin 20/12.5   4.  Hypokalemia -AM labs showing potassium of 3.2, will replace with Kdur 40 meq PO q  6 hours x 2.  -Re[eat labs in am  Code Status: Full code Family Communication:  Disposition Plan: Anticipate discharge home in the next 24 hours   Consultants:  General surgery  Procedures: NG tube placement  HPI/Subjective: [name] Richards reports feeling better, tolerating advancement of her diet.  Objective: Filed Vitals:   04/02/15 2112 04/03/15 0600  BP: 158/84 149/75  Pulse: 61 67  Temp: 98.8 F (37.1 C) 98.7 F (37.1 C)  Resp: 18 18    Intake/Output Summary (Last 24 hours) at 04/03/15 1358 Last data filed at 04/03/15 1025  Gross per 24 hour  Intake   1140 ml  Output   3050 ml  Net  -1910 ml   Filed Weights   03/30/15 2313  Weight: 69.4 kg (153 lb)    Exam:   General:  Calm, pleasant, cooperative in no acute distress, having her lunch  Cardiovascular: Regular rate rhythm normal S1-S2  Respiratory: Normal respiratory effort lungs are clear  Abdomen: Abdomen is soft, nontender, nondistended  Musculoskeletal: No edema  Data Reviewed: Basic Metabolic Panel:  Recent Labs Lab 03/31/15 0003 04/01/15 0530 04/03/15 0441  NA 132* 138 135  K 3.4* 3.3* 3.2*  CL 94* 104 100*  CO2  --  26 27  GLUCOSE 159* 101* 116*  BUN '17 11 7  '$ CREATININE 0.70 0.72 0.86  CALCIUM  --  8.7* 8.9   Liver Function Tests: No results for input(s): AST, ALT, ALKPHOS, BILITOT, PROT, ALBUMIN in the last 168 hours. No results for input(s): LIPASE, AMYLASE in the last 168 hours. No results for input(s): AMMONIA in the last 168 hours. CBC:  Recent  Labs Lab 03/30/15 2353 03/31/15 0003 04/01/15 0530 04/03/15 0441  WBC 9.8  --  8.0 4.5  NEUTROABS 8.6*  --   --   --   HGB 13.1 14.6 11.7* 10.2*  HCT 39.6 43.0 36.8 31.6*  MCV 66.9*  --  69.2* 69.6*  PLT 228  --  234 182   Cardiac Enzymes: No results for input(s): CKTOTAL, CKMB, CKMBINDEX, TROPONINI in the last 168 hours. BNP (last 3 results) No results for input(s): BNP in the last 8760 hours.  ProBNP (last 3  results) No results for input(s): PROBNP in the last 8760 hours.  CBG: No results for input(s): GLUCAP in the last 168 hours.  No results found for this or any previous visit (from the past 240 hour(s)).   Studies: No results found.  Scheduled Meds: . antiseptic oral rinse  7 mL Mouth Rinse q12n4p  . hydrochlorothiazide  12.5 mg Oral Daily   And  . benazepril  20 mg Oral Daily  . chlorhexidine  15 mL Mouth Rinse BID  . docusate sodium  100 mg Oral BID  . enoxaparin (LOVENOX) injection  40 mg Subcutaneous Q24H  . levothyroxine  75 mcg Oral QAC breakfast  . silver sulfADIAZINE  1 application Topical BID  . simvastatin  20 mg Oral QPM   Continuous Infusions:    Principal Problem:   Small bowel obstruction (HCC) Active Problems:   Essential hypertension   Hypokalemia   Hypothyroidism    Time spent: 25 min    Kelvin Cellar  [facility] Pager [phone]. If 7PM-7AM, please contact night-coverage at [email or web address], password [facility] 04/03/2015, 1:58 PM  LOS: 3 days

## 2015-04-04 DIAGNOSIS — K5669 Other intestinal obstruction: Secondary | ICD-10-CM

## 2015-04-04 DIAGNOSIS — E039 Hypothyroidism, unspecified: Secondary | ICD-10-CM

## 2015-04-04 DIAGNOSIS — E876 Hypokalemia: Secondary | ICD-10-CM

## 2015-04-04 DIAGNOSIS — I10 Essential (primary) hypertension: Secondary | ICD-10-CM

## 2015-04-04 LAB — BASIC METABOLIC PANEL
Anion gap: 12 (ref 5–15)
BUN: 9 mg/dL (ref 6–20)
CO2: 23 mmol/L (ref 22–32)
Calcium: 8.8 mg/dL — ABNORMAL LOW (ref 8.9–10.3)
Chloride: 97 mmol/L — ABNORMAL LOW (ref 101–111)
Creatinine, Ser: 0.69 mg/dL (ref 0.44–1.00)
GFR calc Af Amer: >60 mL/min (ref >60)
GFR calc non Af Amer: >60 mL/min (ref >60)
Glucose, Bld: 180 mg/dL — ABNORMAL HIGH (ref 65–99)
Potassium: 3.6 mmol/L (ref 3.5–5.1)
Sodium: 132 mmol/L — ABNORMAL LOW (ref 135–145)

## 2015-04-04 LAB — CBC
HCT: 32.8 % — ABNORMAL LOW (ref 36.0–46.0)
Hemoglobin: 10.7 g/dL — ABNORMAL LOW (ref 12.0–15.0)
MCH: 22.5 pg — ABNORMAL LOW (ref 26.0–34.0)
MCHC: 32.6 g/dL (ref 30.0–36.0)
MCV: 69.1 fL — ABNORMAL LOW (ref 78.0–100.0)
Platelets: 210 10*3/uL (ref 150–400)
RBC: 4.75 MIL/uL (ref 3.87–5.11)
RDW: 13.9 % (ref 11.5–15.5)
WBC: 4.4 10*3/uL (ref 4.0–10.5)

## 2015-04-04 LAB — MAGNESIUM: Magnesium: 1.8 mg/dL (ref 1.7–2.4)

## 2015-04-04 MED ORDER — MAGNESIUM HYDROXIDE 400 MG/5ML PO SUSP
30.0000 mL | Freq: Once | ORAL | Status: AC
  Administered 2015-04-04: 30 mL via ORAL
  Filled 2015-04-04: qty 30

## 2015-04-04 MED ORDER — IBUPROFEN 200 MG PO TABS
400.0000 mg | ORAL_TABLET | Freq: Four times a day (QID) | ORAL | Status: DC | PRN
  Administered 2015-04-04: 400 mg via ORAL
  Filled 2015-04-04: qty 2

## 2015-04-04 NOTE — Progress Notes (Signed)
Patient ID: [name] Richards, female   DOB: 09-Aug-1945, 70 y.o.   MRN: [id number]     [facility]      [address] [place], [place] [address]    Phone: [phone] FAX: [phone]     Subjective: Flatus.  No BM.  Tolerating low residue diet.   Objective:  Vital signs:  Filed Vitals:   04/03/15 0600 04/03/15 1400 04/03/15 2218 04/04/15 0537  BP: 149/75 140/82 133/65 138/62  Pulse: 67 80 73 68  Temp: 98.7 F (37.1 C) 99.1 F (37.3 C) 98.1 F (36.7 C) 98.2 F (36.8 C)  TempSrc: Oral Oral Oral Oral  Resp: _0 Height:      Weight:      SpO2: 96% 96% 93% 98%    Last BM Date: 03/30/15  Intake/Output   Yesterday:  02/21 0701 - 02/22 0700 In: 1280 [P.O.:1280] Out: 4400 [Urine:4400] This shift:    Physical Exam: General: Pt awake/alert/oriented x4 in no acute distress  Abdomen: Soft.  Nondistended.  Non tender.  No evidence of peritonitis.  No incarcerated hernias.   Problem List:   Principal Problem:   Small bowel obstruction (HCC) Active Problems:   Essential hypertension   Hypokalemia   Hypothyroidism    Results:   Labs: Results for orders placed or performed during the hospital encounter of 03/30/15 (from the past 48 hour(s))  Basic metabolic panel     Status: Abnormal   Collection Time: 04/03/15  4:41 AM  Result Value Ref Range   Sodium 135 135 - 145 mmol/L   Potassium 3.2 (L) 3.5 - 5.1 mmol/L   Chloride 100 (L) 101 - 111 mmol/L   CO2 27 22 - 32 mmol/L   Glucose, Bld 116 (H) 65 - 99 mg/dL   BUN 7 6 - 20 mg/dL   Creatinine, Ser 0.86 0.44 - 1.00 mg/dL   Calcium 8.9 8.9 - 10.3 mg/dL   GFR calc non Af Amer >60 >60 mL/min   GFR calc Af Amer >60 >60 mL/min    Comment: (NOTE) The eGFR has been calculated using the CKD EPI equation. This calculation has not been validated in all clinical situations. eGFR's persistently <60 mL/min signify possible Chronic Kidney Disease.    Anion gap 8 5 - 15   CBC     Status: Abnormal   Collection Time: 04/03/15  4:41 AM  Result Value Ref Range   WBC 4.5 4.0 - 10.5 K/uL   RBC 4.54 3.87 - 5.11 MIL/uL   Hemoglobin 10.2 (L) 12.0 - 15.0 g/dL   HCT 31.6 (L) 36.0 - 46.0 %   MCV 69.6 (L) 78.0 - 100.0 fL   MCH 22.5 (L) 26.0 - 34.0 pg   MCHC 32.3 30.0 - 36.0 g/dL   RDW 14.2 11.5 - 15.5 %   Platelets 182 150 - 400 K/uL  Basic metabolic panel     Status: Abnormal   Collection Time: 04/04/15  8:51 AM  Result Value Ref Range   Sodium 132 (L) 135 - 145 mmol/L   Potassium 3.6 3.5 - 5.1 mmol/L   Chloride 97 (L) 101 - 111 mmol/L   CO2 23 22 - 32 mmol/L   Glucose, Bld 180 (H) 65 - 99 mg/dL   BUN 9 6 - 20 mg/dL   Creatinine, Ser 0.69 0.44 - 1.00 mg/dL   Calcium 8.8 (L) 8.9 - 10.3 mg/dL   GFR calc non Af Amer >60 >60  mL/min   GFR calc Af Amer >60 >60 mL/min    Comment: (NOTE) The eGFR has been calculated using the CKD EPI equation. This calculation has not been validated in all clinical situations. eGFR's persistently <60 mL/min signify possible Chronic Kidney Disease.    Anion gap 12 5 - 15  CBC     Status: Abnormal   Collection Time: 04/04/15  8:51 AM  Result Value Ref Range   WBC 4.4 4.0 - 10.5 K/uL   RBC 4.75 3.87 - 5.11 MIL/uL   Hemoglobin 10.7 (L) 12.0 - 15.0 g/dL   HCT 32.8 (L) 36.0 - 46.0 %   MCV 69.1 (L) 78.0 - 100.0 fL   MCH 22.5 (L) 26.0 - 34.0 pg   MCHC 32.6 30.0 - 36.0 g/dL   RDW 13.9 11.5 - 15.5 %   Platelets 210 150 - 400 K/uL  Magnesium     Status: None   Collection Time: 04/04/15  8:51 AM  Result Value Ref Range   Magnesium 1.8 1.7 - 2.4 mg/dL    Imaging / Studies: No results found.  Medications / Allergies:  Scheduled Meds: . antiseptic oral rinse  7 mL Mouth Rinse q12n4p  . hydrochlorothiazide  12.5 mg Oral Daily   And  . benazepril  20 mg Oral Daily  . chlorhexidine  15 mL Mouth Rinse BID  . docusate sodium  100 mg Oral BID  . enoxaparin (LOVENOX) injection  40 mg Subcutaneous Q24H  . levothyroxine  75 mcg  Oral QAC breakfast  . magnesium hydroxide  30 mL Oral Once  . silver sulfADIAZINE  1 application Topical BID  . simvastatin  20 mg Oral QPM   Continuous Infusions:  PRN Meds:.albuterol, cyclobenzaprine, hydrALAZINE, LORazepam, menthol-cetylpyridinium, morphine injection, ondansetron **OR** ondansetron (ZOFRAN) IV, phenol  Antibiotics: Anti-infectives    None        Assessment/Plan pSBO-resolved.  Tolerating soft diet.  Give MOM.  May resume colace/miralax.  Stable for DC from a surgical standpoint.    Erby Pian, [facility] Pager [phone](7A-4:30P)   04/04/2015 9:48 AM

## 2015-04-04 NOTE — Progress Notes (Signed)
Pt's vitals wnl, tolerating diet and pain is under control. Discussed discharge instructions with patient. Discharged to home.

## 2015-04-04 NOTE — Discharge Summary (Signed)
Physician Discharge Summary  [name] Richards BHA:[id number] DOB: 01/26/1946 DOA: 03/30/2015  PCP: Vidal Schwalbe, MD  Admit date: 03/30/2015 Discharge date: 04/04/2015  Time spent: 65 minutes  Recommendations for Outpatient Follow-up:  1. Follow-up with Vidal Schwalbe, MD in 1-2 weeks. On follow-up patient needs a basic metabolic profile done to follow-up on electrolytes and renal function.   Discharge Diagnoses:  Principal Problem:   Small bowel obstruction ([place]) Active Problems:   Essential hypertension   Hypokalemia   Hypothyroidism   Discharge Condition: Stable and improved.  Diet recommendation: Low residue/soft diet.  Filed Weights   03/30/15 2313  Weight: 69.4 kg (153 lb)    History of present illness:  Per Dr Tamala Julian [name] Richards is a 70 year old female with a past medical history significant for vulvar/endometrial/cervical cancer, hypertension, asthma, small bowel obstruction, hyperlipidemia, hypothyroidism; who presented with complaints of nausea and vomiting. Symptoms started around 4- 4:30 PM yesterday afternoon. Associated symptoms included chills, abdominal distention/pain, diarrhea(>5 episodes). Abdominal pain was in the lower part of the abdomen. She reported symptoms usually clear within 4-6 hours, but this episode is not like usual. Almost 12 hours have past and symptoms have not resolved completely. She reported that she just had vulvar cancer surgery on 02/06/2015 Dr Marti Sleigh. Patient noted that she's been taking MiraLAX and Colace scheduled. Denied any fever, chest pain, palpitations, or shortness of breath.  Upon admission patient is evaluated with a CT scan of the abdomen which showed SBO along left lateral pelvic sidewall due to adhesions of the distal small bowel to the left iliac chain surgical clips. General surgery was consulted and recommended placement of an NG tube.    Hospital Course:   #1 small bowel obstruction Patient was admitted with a  small bowel obstruction as noted on clinical symptoms and CT of the abdomen and pelvis. Patient was placed on bowel rest and NG tube placed. Patient was started on IV fluids and general surgery consulted. General surgery followed the patient throughout the hospitalization and monitored her. Serial abdominal films were done. Patient improved clinically was subsequently transitioned to clear liquid diet which she tolerated. Patient's diet was advanced to a soft diet. By day of discharge patient was having bowel movement was passing flatus did not have any further abdominal pain no nausea no vomiting and tolerating oral intake. Patient will follow-up with PCP as outpatient.  #2 hypothyroidism Once patient was placed on bowel rest his Synthroid was transitioned to IV Synthroid. IV Synthroid was subsequently transitioned to oral Synthroid once patient was tolerating oral intake and resolution of her small bowel obstruction. Outpatient follow-up.  #3 hypertension Remained stable throughout the hospitalization. Once patient was tolerating oral intakes was resumed back on a home regimen of Lotensin/HCTZ.  #4 hypokalemia Repleted during the hospitalization.   Procedures:  CT abdomen and pelvis 03/31/2015  Abdominal x-ray 03/31/2015, 04/01/2015   acute abdominal series 03/31/2015  Consultations:  General Surgery: Dr Zella Richer 03/31/2015  Discharge Exam: Filed Vitals:   04/03/15 2218 04/04/15 0537  BP: 133/65 138/62  Pulse: 73 68  Temp: 98.1 F (36.7 C) 98.2 F (36.8 C)  Resp: 16 16    General: NAD Cardiovascular: RRR Respiratory: CTAB  Discharge Instructions   Discharge Instructions    Diet general    Complete by:  As directed   Low residue/soft diet     Discharge instructions    Complete by:  As directed   Follow up with Vidal Schwalbe, MD in 1-2 weeks  Increase activity slowly    Complete by:  As directed           Current Discharge Medication List    CONTINUE these  medications which have NOT CHANGED   Details  acidophilus (RISAQUAD) CAPS Take 1 capsule by mouth daily.    albuterol (PROVENTIL HFA;VENTOLIN HFA) 108 (90 Base) MCG/ACT inhaler Inhale 2 puffs into the lungs every 6 (six) hours as needed for wheezing or shortness of breath.    benazepril-hydrochlorthiazide (LOTENSIN HCT) 20-12.5 MG per tablet Take 1 tablet by mouth daily.    cholecalciferol (VITAMIN D) 1000 UNITS tablet Take 1,000 Units by mouth 2 (two) times daily.     clobetasol ointment (TEMOVATE) 2.77 % Apply 1 application topically 2 (two) times daily. Qty: 30 g, Refills: 0   Associated Diagnoses: Vulvar mass    dicyclomine (BENTYL) 20 MG tablet Take 1 tablet (20 mg total) by mouth every 6 (six) hours as needed for spasms (for abdominal cramping). Qty: 20 tablet, Refills: 0    docusate sodium (COLACE) 100 MG capsule Take 100 mg by mouth 2 (two) times daily.    levothyroxine (SYNTHROID, LEVOTHROID) 75 MCG tablet Take 75 mcg by mouth daily.    magnesium gluconate (MAGONATE) 500 MG tablet Take 500 mg by mouth every morning.     Multiple Vitamins-Minerals (MULTIVITAMIN WITH MINERALS) tablet Take 1 tablet by mouth daily.    OIL OF OREGANO PO Take 1 capsule by mouth daily.    omega-3 acid ethyl esters (LOVAZA) 1 G capsule Take 1 g by mouth 2 (two) times daily.    ondansetron (ZOFRAN ODT) 8 MG disintegrating tablet Take 1 tablet (8 mg total) by mouth every 8 (eight) hours as needed for nausea or vomiting. Qty: 10 tablet, Refills: 0    silver sulfADIAZINE (SILVADENE) 1 % cream Apply 1 application topically 2 (two) times daily. To the vulva Qty: 50 g, Refills: 1   Associated Diagnoses: Vulvar cancer (HCC)    simvastatin (ZOCOR) 20 MG tablet Take 20 mg by mouth every evening.    valACYclovir (VALTREX) 1000 MG tablet Take 1,000 mg by mouth 2 (two) times daily as needed (for 2 days for cold sores).     vitamin C (ASCORBIC ACID) 500 MG tablet Take 500 mg by mouth daily.        Allergies  Allergen Reactions  . Codeine     Reporting amnesia  . Prochlorperazine Edisylate     Jaws locked  . Fentanyl     hypotension  . Penicillins Hives    Has patient had a PCN reaction causing immediate rash, facial/tongue/throat swelling, SOB or lightheadedness with hypotension:noHas patient had a PCN reaction causing severe rash involving mucus membranes or skin necrosis: yes Has patient had a PCN reaction that required hospitalization:yes, Urgent Care visit Has patient had a PCN reaction occurring within the last 10 years: no If all of the above answers are "NO", then may proceed with Cephalosporin use.   Marland Kitchen Percocet [Oxycodone-Acetaminophen] Other (See Comments)    Made pt feel real funny, never wanting to take it again   Follow-up Information    Follow up with WHITE,CYNTHIA S, MD. Schedule an appointment as soon as possible for a visit in 2 weeks.   Specialty:  Family Medicine   Why:  f/u in 1-2 weeks.   Contact information:   [address] [place] [address] [phone]        The results of significant diagnostics from this  hospitalization (including imaging, microbiology, ancillary and laboratory) are listed below for reference.    Significant Diagnostic Studies: Dg Abd 1 View  03/31/2015  CLINICAL DATA:  NG tube placement. EXAM: ABDOMEN - 1 VIEW COMPARISON:  CT chest 03/31/2015 FINDINGS: Enteric tube with tip in the left upper quadrant consistent with location in the upper stomach. Residual contrast material in the stomach and small bowel. Residual contrast material in the bladder and urinary collecting systems. Surgical clips in the pelvis. IMPRESSION: Enteric tube tip is in the left upper quadrant consistent with location in the upper stomach. Electronically Signed   By: Lucienne Capers M.D.   On: 03/31/2015 04:17   Ct Abdomen Pelvis W Contrast  03/31/2015  CLINICAL DATA:  70 year old female with abdominal pain, nausea and vomiting EXAM:  CT ABDOMEN AND PELVIS WITH CONTRAST TECHNIQUE: Multidetector CT imaging of the abdomen and pelvis was performed using the standard protocol following bolus administration of intravenous contrast. CONTRAST:  170m OMNIPAQUE IOHEXOL 300 MG/ML  SOLN COMPARISON:  Radiograph dated 03/31/2015 and CT dated 02/19/2011 FINDINGS: The visualized lung bases are clear. No intra-abdominal free air. Small free fluid in the upper abdomen as well as within the pelvis. The liver, gallbladder, pancreas, spleen, adrenal glands appear unremarkable. There is stable appearing right renal upper pole irregularity and scarring, likely related to prior surgery. Grossly stable subcentimeter left renal interpolar hypodensity is not well characterized but likely represents a cyst. There is minimal fullness of the renal collecting systems. The visualized ureters and urinary bladder appear unremarkable. Hysterectomy. The terminal ileum is collapsed. There is abutment of the terminal ileum to the right pelvic sidewall surgical clips compatible with adhesions. There is also abutment of loops of small bowel to the surgical clips along the left pelvic side wall compatible with adhesions. There is a caliber change in the the bowel loops at the left pelvic surgical clips with dilatation of the small bowel proximal to this area compatible with small bowel obstruction. There is thickened appearance and fluid-filled loops of mid and distal small bowel proximal to this area which may be reactive and represent inflammation or superimposed infection enteritis. The colon is decompressed. The appendix is unremarkable. There is aortoiliac atherosclerotic disease. No portal venous gas identified. There is no adenopathy. Extensive surgical staples noted along the abdominal aorta and bilateral iliac chain, likely related to prior lymph node dissection. There is no adenopathy. The abdominal wall soft tissues appear unremarkable. There is degenerative changes of the  spine. Stable appearing grade 2 L5-S1 anterolisthesis. No acute fracture. IMPRESSION: Small-bowel obstruction with transition zone along the left lateral pelvic sidewall due to adhesions of the distal small bowel to the left iliac chain surgical clips. Multiple thickened and fluid-filled loops of small bowel noted proximal to this area which may represent reactive inflammation or superimposed infectious enteritis. Clinical correlation is recommend. Electronically Signed   By: AAnner CreteM.D.   On: 03/31/2015 02:26   Dg Abd 2 Views  04/01/2015  CLINICAL DATA:  Follow-up small bowel obstruction. EXAM: ABDOMEN - 2 VIEW COMPARISON:  Yesterday FINDINGS: Nasogastric tube tip and side hole in the proximal stomach. Interval dilated loop of jejunum in the left upper abdomen. Oral contrast in normal caliber colon. Gas in normal caliber distal small bowel loops. Bilateral pelvic and lower lumbar spine region surgical clips. No acute bony abnormality. No free peritoneal air. IMPRESSION: Interval visualization dilated jejunum in the left upper abdomen, similar to that seen on the CT obtained yesterday, compatible partial  small bowel obstruction. Electronically Signed   By: Claudie Revering M.D.   On: 04/01/2015 10:51   Dg Abd Acute W/chest  03/31/2015  CLINICAL DATA:  20-year-old female with nausea vomiting and diarrhea and diffuse abdominal pain. EXAM: DG ABDOMEN ACUTE W/ 1V CHEST COMPARISON:  Chest radiograph dated 03/07/2014 and head CT dated 01/30/2015 FINDINGS: The lungs are clear. No pleural effusion or pneumothorax. The cardiac silhouette is within normal limits. No acute osseous pathology. There is no evidence of bowel obstruction. No free air or radiopaque calculi identified. Stop surgical clips noted along the distal aorta and iliac vessels. There is mild scoliosis of the thoracolumbar spine. No acute fracture. IMPRESSION: No acute intrathoracic, abdominal, or pelvic pathology. Electronically Signed   By: Anner Crete M.D.   On: 03/31/2015 00:43    Microbiology: No results found for this or any previous visit (from the past 240 hour(s)).   Labs: Basic Metabolic Panel:  Recent Labs Lab 03/31/15 0003 04/01/15 0530 04/03/15 0441 04/04/15 0851  NA 132* 138 135 132*  K 3.4* 3.3* 3.2* 3.6  CL 94* 104 100* 97*  CO2  --  '26 27 23  '$ GLUCOSE 159* 101* 116* 180*  BUN '17 11 7 9  '$ CREATININE 0.70 0.72 0.86 0.69  CALCIUM  --  8.7* 8.9 8.8*  MG  --   --   --  1.8   Liver Function Tests: No results for input(s): AST, ALT, ALKPHOS, BILITOT, PROT, ALBUMIN in the last 168 hours. No results for input(s): LIPASE, AMYLASE in the last 168 hours. No results for input(s): AMMONIA in the last 168 hours. CBC:  Recent Labs Lab 03/30/15 2353 03/31/15 0003 04/01/15 0530 04/03/15 0441 04/04/15 0851  WBC 9.8  --  8.0 4.5 4.4  NEUTROABS 8.6*  --   --   --   --   HGB 13.1 14.6 11.7* 10.2* 10.7*  HCT 39.6 43.0 36.8 31.6* 32.8*  MCV 66.9*  --  69.2* 69.6* 69.1*  PLT 228  --  234 182 210   Cardiac Enzymes: No results for input(s): CKTOTAL, CKMB, CKMBINDEX, TROPONINI in the last 168 hours. BNP: BNP (last 3 results) No results for input(s): BNP in the last 8760 hours.  ProBNP (last 3 results) No results for input(s): PROBNP in the last 8760 hours.  CBG: No results for input(s): GLUCAP in the last 168 hours.     SignedIrine Seal MD.  [facility] 04/04/2015, 11:47 AM   [id number]

## 2015-04-06 ENCOUNTER — Ambulatory Visit: Payer: Medicare Other | Admitting: Gynecology

## 2015-05-04 ENCOUNTER — Encounter: Payer: Self-pay | Admitting: Gynecology

## 2015-05-04 ENCOUNTER — Ambulatory Visit: Payer: Medicare Other | Attending: Gynecology | Admitting: Gynecology

## 2015-05-04 VITALS — BP 154/76 | HR 67 | Temp 98.2°F | Resp 18

## 2015-05-04 DIAGNOSIS — E079 Disorder of thyroid, unspecified: Secondary | ICD-10-CM | POA: Diagnosis not present

## 2015-05-04 DIAGNOSIS — C539 Malignant neoplasm of cervix uteri, unspecified: Secondary | ICD-10-CM

## 2015-05-04 DIAGNOSIS — Z8544 Personal history of malignant neoplasm of other female genital organs: Secondary | ICD-10-CM | POA: Diagnosis not present

## 2015-05-04 DIAGNOSIS — J45909 Unspecified asthma, uncomplicated: Secondary | ICD-10-CM | POA: Diagnosis not present

## 2015-05-04 DIAGNOSIS — C519 Malignant neoplasm of vulva, unspecified: Secondary | ICD-10-CM

## 2015-05-04 DIAGNOSIS — I10 Essential (primary) hypertension: Secondary | ICD-10-CM | POA: Diagnosis not present

## 2015-05-04 DIAGNOSIS — E785 Hyperlipidemia, unspecified: Secondary | ICD-10-CM | POA: Insufficient documentation

## 2015-05-04 DIAGNOSIS — Z8541 Personal history of malignant neoplasm of cervix uteri: Secondary | ICD-10-CM | POA: Diagnosis not present

## 2015-05-04 NOTE — Progress Notes (Signed)
Consult Note: Gyn-Onc   [name] Richards 70 y.o. female  Chief Complaint  Patient presents with  . 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 No evidence of disease.  Past history of Poorly differentiated endometrial and cervical cancer 1997. No evidence of disease.  Intermittent partial small bowel obstruction. Lichen sclerosis of the vulva (stable)  Plan:  Patient returns seem me in 2 months. She'll call she has any new symptoms or physical findings. Interval History:  Patient returns today for continuing follow-up is scheduled. Since her last visit she's had no problems in the vulva. However, she was admitted with a partial small bowel obstruction which resolved with conservative management.. She has no other GI or GU or pelvic symptoms.  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 She underwent a total abdominal hysterectomy and bilateral salpingo-oophorectomy, pelvic and para-aortic lymphadenectomy. She received postoperative whole pelvis radiation therapy.  Her other primary gynecologic problem has been lichen sclerosus managed with when necessary clobetasol.  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  In December 2016 the patient was found to have a new primary squamous cell carcinoma the vulva undergoing a modified radical vulvectomy on 02/06/2015. She underwent a modified radical vulvectomy at [facility] on 02/06/2015. She was found to have a 3 cm vulvar lesion of the posterior vulva extending into the posterior vagina. A rhomboid flap was required to close the incision. Patient had on pelvic postoperative course. She reports she's been doing well. She irrigates the wound twice a day with a tepid water  and a blow dryer. She denies any drainage or pain or fever. She has been walking approximate half mile a day.  Final pathology showed some close surgical margins. However, the specimen margins were toward the time of surgery and it is my impression that we were widely around the primary cancer. I discussed these findings with the patient and we've agreed to monitor her closely but not recommend any radiation therapy to the vulva. The patient's agreement with this recommendation.   Review of Systems:10 point review of systems is negative except as noted in interval history.   Vitals: Blood pressure 154/76, pulse 67, temperature 98.2 F (36.8 C), temperature source Oral, resp. rate 18, SpO2 100 %.  Physical Exam: General : The patient is a healthy woman in no acute distress.  HEENT: normocephalic, extraoccular movements normal; neck is supple without thyromegally  Lynphnodes: Supraclavicular and inguinal nodes not enlarged    Abdomen: Soft, non-tender, no ascites, no organomegally, no masses, no hernias  Pelvic:  EGBUS: Normal female mild lichen sclerosis on the anterior vulva.  The perineum is well-healed. There are no lesions.  Lower extremities: No edema or varicosities. Normal range of motion      Allergies  Allergen Reactions  . Codeine     Reporting amnesia  . Prochlorperazine Edisylate     Jaws locked  . Fentanyl     hypotension  . Penicillins Hives    Has patient had a PCN reaction causing immediate rash, facial/tongue/throat swelling, SOB or lightheadedness with hypotension:noHas patient had a PCN reaction causing severe rash involving mucus membranes or skin necrosis: yes Has patient had a PCN reaction that required hospitalization:yes, Urgent Care visit Has patient had a PCN reaction occurring within  the last 10 years: no If all of the above answers are "NO", then may proceed with Cephalosporin use.   Marland Kitchen Percocet [Oxycodone-Acetaminophen] Other (See Comments)    Made pt  feel real funny, never wanting to take it again    Past Medical History  Diagnosis Date  . Asthma   . Hypertension   . Thyroid disease   . Cervical cancer ([place]) 1997    poorly differentiated cervical carcinoma. S/p TAH and radiation x6 wks   . Endometrial cancer ([place]) 1997    coincidental endometrial cancer also  . Hyperlipemia   . 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 Vertebral body hemangioma     T12  . Small bowel obstruction ([place])     12/2008, repeat SBO in 02/2011 also with terminal ilietis   . Complication of anesthesia     took 2 days to wake up after 1 surgery    Past Surgical History  Procedure Laterality Date  . Back surgery    . Tubal ligation    . Skin biopsy    . Abdominal hysterectomy  Feb 1997    TAH/BSO, pelvic and periaortic lymphadenectomy   . Partial nephrectomy      right    Current Outpatient Prescriptions  Medication Sig Dispense Refill  . acidophilus (RISAQUAD) CAPS Take 1 capsule by mouth daily.    Marland Kitchen albuterol (PROVENTIL HFA;VENTOLIN HFA) 108 (90 Base) MCG/ACT inhaler Inhale 2 puffs into the lungs every 6 (six) hours as needed for wheezing or shortness of breath.    . benazepril-hydrochlorthiazide (LOTENSIN HCT) 20-12.5 MG per tablet Take 1 tablet by mouth daily.    . cholecalciferol (VITAMIN D) 1000 UNITS tablet Take 1,000 Units by mouth 2 (two) times daily.     Marland Kitchen dicyclomine (BENTYL) 20 MG tablet Take 1 tablet (20 mg total) by mouth every 6 (six) hours as needed for spasms (for abdominal cramping). 20 tablet 0  . docusate sodium (COLACE) 100 MG capsule Take 100 mg by mouth 2 (two) times daily.    Marland Kitchen levothyroxine (SYNTHROID, LEVOTHROID) 75 MCG tablet Take 75 mcg by mouth daily.    . magnesium gluconate (MAGONATE) 500 MG tablet Take 500 mg by mouth every morning.     . Multiple Vitamins-Minerals (MULTIVITAMIN WITH MINERALS) tablet Take 1 tablet by mouth daily.    . OIL OF OREGANO PO Take 1 capsule by mouth daily.    Marland Kitchen  omega-3 acid ethyl esters (LOVAZA) 1 G capsule Take 1 g by mouth 2 (two) times daily.    . ondansetron (ZOFRAN ODT) 8 MG disintegrating tablet Take 1 tablet (8 mg total) by mouth every 8 (eight) hours as needed for nausea or vomiting. 10 tablet 0  . silver sulfADIAZINE (SILVADENE) 1 % cream Apply 1 application topically 2 (two) times daily. To the vulva 50 g 1  . simvastatin (ZOCOR) 20 MG tablet Take 20 mg by mouth every evening.    . valACYclovir (VALTREX) 1000 MG tablet Take 1,000 mg by mouth 2 (two) times daily as needed (for 2 days for cold sores).     . vitamin C (ASCORBIC ACID) 500 MG tablet Take 500 mg by mouth daily.    . clobetasol ointment (TEMOVATE) 3.50 % Apply 1 application topically 2 (two) times daily. (Patient not taking: Reported on 05/04/2015) 30 g 0   No current facility-administered medications for this visit.    Social History   Social History  . Marital  Status: Divorced    Spouse Name: N/A  . Number of Children: N/A  . Years of Education: N/A   Occupational History  . Not on file.   Social History Main Topics  . Smoking status: Never Smoker   . Smokeless tobacco: Never Used  . Alcohol Use: No  . Drug Use: No  . Sexual Activity: No   Other Topics Concern  . Not on file   Social History Narrative   Lives at home alone, works out at [employer] 6 days a week, still very active. Has an ex-husband who she is in contact with. Son died of an MI and another son in [place].     Family History  Problem Relation Age of Onset  . Hypertension Other   . Diabetes Other   . Stroke Other   . CAD Other       CLARKE-PEARSON,Brunilda Eble L, MD 05/04/2015, 12:26 PM

## 2015-05-04 NOTE — Patient Instructions (Signed)
Plan to follow up in two months or sooner if needed.  Please call for any questions or concerns.

## 2015-06-29 ENCOUNTER — Ambulatory Visit: Payer: Medicare Other | Attending: Gynecology | Admitting: Gynecology

## 2015-06-29 VITALS — BP 173/71 | HR 61 | Temp 98.5°F | Resp 18

## 2015-06-29 DIAGNOSIS — K909 Intestinal malabsorption, unspecified: Secondary | ICD-10-CM

## 2015-06-29 DIAGNOSIS — Z8542 Personal history of malignant neoplasm of other parts of uterus: Secondary | ICD-10-CM | POA: Diagnosis not present

## 2015-06-29 DIAGNOSIS — E785 Hyperlipidemia, unspecified: Secondary | ICD-10-CM | POA: Insufficient documentation

## 2015-06-29 DIAGNOSIS — Z8544 Personal history of malignant neoplasm of other female genital organs: Secondary | ICD-10-CM

## 2015-06-29 DIAGNOSIS — C801 Malignant (primary) neoplasm, unspecified: Secondary | ICD-10-CM | POA: Insufficient documentation

## 2015-06-29 DIAGNOSIS — Y842 Radiological procedure and radiotherapy as the cause of abnormal reaction of the patient, or of later complication, without mention of misadventure at the time of the procedure: Secondary | ICD-10-CM | POA: Insufficient documentation

## 2015-06-29 DIAGNOSIS — E079 Disorder of thyroid, unspecified: Secondary | ICD-10-CM | POA: Insufficient documentation

## 2015-06-29 DIAGNOSIS — J45909 Unspecified asthma, uncomplicated: Secondary | ICD-10-CM | POA: Insufficient documentation

## 2015-06-29 DIAGNOSIS — C519 Malignant neoplasm of vulva, unspecified: Secondary | ICD-10-CM

## 2015-06-29 DIAGNOSIS — L280 Lichen simplex chronicus: Secondary | ICD-10-CM | POA: Insufficient documentation

## 2015-06-29 DIAGNOSIS — Z880 Allergy status to penicillin: Secondary | ICD-10-CM | POA: Insufficient documentation

## 2015-06-29 DIAGNOSIS — I10 Essential (primary) hypertension: Secondary | ICD-10-CM | POA: Insufficient documentation

## 2015-06-29 DIAGNOSIS — R197 Diarrhea, unspecified: Secondary | ICD-10-CM

## 2015-06-29 NOTE — Progress Notes (Signed)
Consult Note: Gyn-Onc   [name] Richards 70 y.o. female  Chief Complaint  Patient presents with  . Follow-up  . 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 No evidence of disease.  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  Past history of Poorly differentiated endometrial and cervical cancer 1997. No evidence of disease.  Intermittent partial small bowel obstruction. Lichen sclerosis of the vulva (stable)  Plan:  Patient returns seem me in 3 months. She'll call she has any new symptoms or physical findings. She's encouraged to reduce the amount of fiber in her diet. Also she finds that there are particular foods cause diarrhea she should avoid them. We had a lengthy discussion about adhesions as well as radiation enteritis. Clearly we are trying to avoid any surgical intervention if possible. Interval History:  Patient returns today for continuing follow-up as scheduled. Since her last visit she's had no problems in the vulva. However, she has had 2 episodes of severe diarrhea and abdominal cramping. One was also was traveling in [place] and the other week later here in [place]. She managed both of these at home conservatively. She denies any nausea or vomiting associated with these episodes. She does note that she became more liberal with her diet while she was traveling eating salads and a broader selection of food than she usually does at home.   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 She underwent a total abdominal hysterectomy and bilateral salpingo-oophorectomy, pelvic and para-aortic lymphadenectomy. She received postoperative whole pelvis radiation therapy.  Her other primary gynecologic problem has been lichen sclerosus managed with when necessary clobetasol.  Beginning in 2013 the patient had intermittent  episodes of partial small bowel obstruction most likely secondary to radiation injury to the terminal ileum.  In December 2016 the patient was found to have a new primary squamous cell carcinoma the vulva undergoing a modified radical vulvectomy on 02/06/2015. She underwent a modified radical vulvectomy at [facility] on 02/06/2015. She was found to have a 3 cm vulvar lesion of the posterior vulva extending into the posterior vagina. A rhomboid flap was required to close the incision. Patient had on pelvic postoperative course. She reports she's been doing well. She irrigates the wound twice a day with a tepid water and a blow dryer. She denies any drainage or pain or fever. She has been walking approximate half mile a day.  Final pathology showed some close surgical margins. However, the specimen margins were toward the time of surgery and it is my impression that we were widely around the primary cancer. I discussed these findings with the patient and we've agreed to monitor her closely but not recommend any radiation therapy to the vulva. The patient's agreement with this recommendation.   Review of Systems:10 point review of systems is negative except as noted in interval history.   Vitals: Blood pressure 173/71, pulse 61, temperature 98.5 F (36.9 C), temperature source Oral, resp. rate 18, SpO2 100 %.  Physical Exam: General : The patient is a healthy woman in no acute distress.  HEENT: normocephalic, extraoccular movements normal; neck is supple without thyromegally  Lynphnodes: Supraclavicular and inguinal nodes not enlarged    Abdomen: Soft, non-tender, no ascites, no organomegally, no masses, no hernias  Pelvic:  EGBUS: Normal female mild lichen sclerosis on the anterior vulva.  The perineum is well-healed. There are no lesions.  Lower  extremities: No edema or varicosities. Normal range of motion      Allergies  Allergen Reactions  . Codeine     Reporting amnesia  .  Prochlorperazine Edisylate     Jaws locked  . Fentanyl     hypotension  . Penicillins Hives    Has patient had a PCN reaction causing immediate rash, facial/tongue/throat swelling, SOB or lightheadedness with hypotension:noHas patient had a PCN reaction causing severe rash involving mucus membranes or skin necrosis: yes Has patient had a PCN reaction that required hospitalization:yes, Urgent Care visit Has patient had a PCN reaction occurring within the last 10 years: no If all of the above answers are "NO", then may proceed with Cephalosporin use.   Marland Kitchen Percocet [Oxycodone-Acetaminophen] Other (See Comments)    Made pt feel real funny, never wanting to take it again    Past Medical History  Diagnosis Date  . Asthma   . Hypertension   . Thyroid disease   . Cervical cancer ([place]) 1997    poorly differentiated cervical carcinoma. S/p TAH and radiation x6 wks   . Endometrial cancer ([place]) 1997    coincidental endometrial cancer also  . Hyperlipemia   . 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 Vertebral body hemangioma     T12  . Small bowel obstruction ([place])     12/2008, repeat SBO in 02/2011 also with terminal ilietis   . Complication of anesthesia     took 2 days to wake up after 1 surgery    Past Surgical History  Procedure Laterality Date  . Back surgery    . Tubal ligation    . Skin biopsy    . Abdominal hysterectomy  Feb 1997    TAH/BSO, pelvic and periaortic lymphadenectomy   . Partial nephrectomy      right    Current Outpatient Prescriptions  Medication Sig Dispense Refill  . acidophilus (RISAQUAD) CAPS Take 1 capsule by mouth daily.    . benazepril-hydrochlorthiazide (LOTENSIN HCT) 20-12.5 MG per tablet Take 1 tablet by mouth daily.    . cholecalciferol (VITAMIN D) 1000 UNITS tablet Take 1,000 Units by mouth 2 (two) times daily.     Marland Kitchen dicyclomine (BENTYL) 20 MG tablet Take 1 tablet (20 mg total) by mouth every 6 (six) hours as needed for  spasms (for abdominal cramping). 20 tablet 0  . docusate sodium (COLACE) 100 MG capsule Take 100 mg by mouth 2 (two) times daily.    Marland Kitchen levothyroxine (SYNTHROID, LEVOTHROID) 75 MCG tablet Take 75 mcg by mouth daily.    . magnesium gluconate (MAGONATE) 500 MG tablet Take 500 mg by mouth every morning.     . Multiple Vitamins-Minerals (MULTIVITAMIN WITH MINERALS) tablet Take 1 tablet by mouth daily.    . OIL OF OREGANO PO Take 1 capsule by mouth daily.    Marland Kitchen omega-3 acid ethyl esters (LOVAZA) 1 G capsule Take 1 g by mouth 2 (two) times daily.    . ondansetron (ZOFRAN ODT) 8 MG disintegrating tablet Take 1 tablet (8 mg total) by mouth every 8 (eight) hours as needed for nausea or vomiting. 10 tablet 0  . simvastatin (ZOCOR) 20 MG tablet Take 20 mg by mouth every evening.    . vitamin C (ASCORBIC ACID) 500 MG tablet Take 500 mg by mouth daily.    Marland Kitchen albuterol (PROVENTIL HFA;VENTOLIN HFA) 108 (90 Base) MCG/ACT inhaler Inhale 2 puffs into the lungs every 6 (six) hours as  needed for wheezing or shortness of breath. Reported on 06/29/2015    . clobetasol ointment (TEMOVATE) 7.99 % Apply 1 application topically 2 (two) times daily. (Patient not taking: Reported on 05/04/2015) 30 g 0  . valACYclovir (VALTREX) 1000 MG tablet Take 1,000 mg by mouth 2 (two) times daily as needed (for 2 days for cold sores). Reported on 06/29/2015     No current facility-administered medications for this visit.    Social History   Social History  . Marital Status: Divorced    Spouse Name: N/A  . Number of Children: N/A  . Years of Education: N/A   Occupational History  . Not on file.   Social History Main Topics  . Smoking status: Never Smoker   . Smokeless tobacco: Never Used  . Alcohol Use: No  . Drug Use: No  . Sexual Activity: No   Other Topics Concern  . Not on file   Social History Narrative   Lives at home alone, works out at [employer] 6 days a week, still very active. Has an ex-husband who she is in contact  with. Son died of an MI and another son in [place].     Family History  Problem Relation Age of Onset  . Hypertension Other   . Diabetes Other   . Stroke Other   . CAD Other       CLARKE-PEARSON,Amaryah Mallen L, MD 06/29/2015, 1:36 PM

## 2015-06-29 NOTE — Patient Instructions (Signed)
Return to see me in 3 months. Contact us if GI symptoms worsen.

## 2015-07-10 ENCOUNTER — Ambulatory Visit (INDEPENDENT_AMBULATORY_CARE_PROVIDER_SITE_OTHER): Payer: Medicare Other | Admitting: Emergency Medicine

## 2015-07-10 ENCOUNTER — Encounter: Payer: Self-pay | Admitting: Emergency Medicine

## 2015-07-10 VITALS — BP 134/84 | HR 78 | Ht 65.25 in | Wt 156.0 lb

## 2015-07-10 DIAGNOSIS — R911 Solitary pulmonary nodule: Secondary | ICD-10-CM | POA: Diagnosis not present

## 2015-07-10 NOTE — Progress Notes (Signed)
Subjective:    Patient ID: [name] Richards, female    DOB: 01-06-1946, 70 y.o.   MRN: [id number]  HPI 70 year old never smoker with a history of hypertension, cervical cancer, partial right nephrectomy for angiomyolipoma. She was seen in our office remotely by Dr Gwenette Greet for a right pleural effusion. She was also noted to have some right-sided primary nodules at that time. More recently she has been under the care of Dr. Fermin Schwab for vulvar cancer and is status post surgical treatment on 02/06/15 at [facility]. She underwent a PET scan on 01/30/15 that I have personally reviewed. This scan shows a left upper lobe some solid ground glass nodule measuring approximately 1.9 cm. This is enlarged compared with a CT scan of the chest from 2013 at which time it was 11 mm. The central solid component measures 6 mm currently. She is asymptomatic. She is referred to discuss the abnormality on her PET scan.     Review of Systems  Constitutional: Negative for fever and unexpected weight change.  HENT: Negative for congestion, dental problem, ear pain, nosebleeds, postnasal drip, rhinorrhea, sinus pressure, sneezing, sore throat and trouble swallowing.   Eyes: Negative for redness and itching.  Respiratory: Negative for cough, chest tightness, shortness of breath and wheezing.   Cardiovascular: Negative for palpitations and leg swelling.  Gastrointestinal: Negative for nausea and vomiting.  Genitourinary: Negative for dysuria.  Musculoskeletal: Negative for joint swelling.  Skin: Negative for rash.  Neurological: Negative for headaches.  Hematological: Does not bruise/bleed easily.  Psychiatric/Behavioral: Negative for dysphoric mood. The patient is not nervous/anxious.     Past Medical History  Diagnosis Date  . Asthma   . Hypertension   . Thyroid disease   . Cervical cancer ([place]) 1997    poorly differentiated cervical carcinoma. S/p TAH and radiation x6 wks   . Endometrial cancer ([place]) 1997   coincidental endometrial cancer also  . Hyperlipemia   . 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 Vertebral body hemangioma     T12  . Small bowel obstruction ([place])     12/2008, repeat SBO in 02/2011 also with terminal ilietis   . Complication of anesthesia     took 2 days to wake up after 1 surgery     Family History  Problem Relation Age of Onset  . Hypertension Other   . Diabetes Other   . Stroke Other   . CAD Other   family hx lung CA in sister and cousin.   Social History   Social History  . Marital Status: Divorced    Spouse Name: N/A  . Number of Children: N/A  . Years of Education: N/A   Occupational History  . Not on file.   Social History Main Topics  . Smoking status: Never Smoker   . Smokeless tobacco: Never Used  . Alcohol Use: No  . Drug Use: No  . Sexual Activity: No   Other Topics Concern  . Not on file   Social History Narrative   Lives at home alone, works out at [employer] 6 days a week, still very active. Has an ex-husband who she is in contact with. Son died of an MI and another son in [place].     Allergies  Allergen Reactions  . Codeine     Reporting amnesia  . Prochlorperazine Edisylate     Jaws locked  . Fentanyl     hypotension  . Penicillins Hives  Has patient had a PCN reaction causing immediate rash, facial/tongue/throat swelling, SOB or lightheadedness with hypotension:noHas patient had a PCN reaction causing severe rash involving mucus membranes or skin necrosis: yes Has patient had a PCN reaction that required hospitalization:yes, Urgent Care visit Has patient had a PCN reaction occurring within the last 10 years: no If all of the above answers are "NO", then may proceed with Cephalosporin use.   Marland Kitchen Percocet [Oxycodone-Acetaminophen] Other (See Comments)    Made pt feel real funny, never wanting to take it again     Outpatient Prescriptions Prior to Visit  Medication Sig Dispense Refill  .  acidophilus (RISAQUAD) CAPS Take 1 capsule by mouth daily.    Marland Kitchen albuterol (PROVENTIL HFA;VENTOLIN HFA) 108 (90 Base) MCG/ACT inhaler Inhale 2 puffs into the lungs every 6 (six) hours as needed for wheezing or shortness of breath. Reported on 06/29/2015    . benazepril-hydrochlorthiazide (LOTENSIN HCT) 20-12.5 MG per tablet Take 1 tablet by mouth daily.    . cholecalciferol (VITAMIN D) 1000 UNITS tablet Take 1,000 Units by mouth 2 (two) times daily.     Marland Kitchen dicyclomine (BENTYL) 20 MG tablet Take 1 tablet (20 mg total) by mouth every 6 (six) hours as needed for spasms (for abdominal cramping). 20 tablet 0  . docusate sodium (COLACE) 100 MG capsule Take 100 mg by mouth 2 (two) times daily.    Marland Kitchen levothyroxine (SYNTHROID, LEVOTHROID) 75 MCG tablet Take 75 mcg by mouth daily.    . magnesium gluconate (MAGONATE) 500 MG tablet Take 500 mg by mouth every morning.     . Multiple Vitamins-Minerals (MULTIVITAMIN WITH MINERALS) tablet Take 1 tablet by mouth daily.    . OIL OF OREGANO PO Take 1 capsule by mouth daily.    Marland Kitchen omega-3 acid ethyl esters (LOVAZA) 1 G capsule Take 1 g by mouth 2 (two) times daily.    . ondansetron (ZOFRAN ODT) 8 MG disintegrating tablet Take 1 tablet (8 mg total) by mouth every 8 (eight) hours as needed for nausea or vomiting. 10 tablet 0  . simvastatin (ZOCOR) 20 MG tablet Take 20 mg by mouth every evening.    . valACYclovir (VALTREX) 1000 MG tablet Take 1,000 mg by mouth 2 (two) times daily as needed (for 2 days for cold sores). Reported on 06/29/2015    . vitamin C (ASCORBIC ACID) 500 MG tablet Take 500 mg by mouth daily.    . clobetasol ointment (TEMOVATE) 6.94 % Apply 1 application topically 2 (two) times daily. (Patient not taking: Reported on 05/04/2015) 30 g 0   No facility-administered medications prior to visit.         Objective:   Physical Exam Filed Vitals:   07/10/15 1527 07/10/15 1528  BP:  134/84  Pulse:  78  Height: 5' 5.25" (1.657 m)   Weight: 156 lb (70.761  kg)   SpO2:  98%   Gen: Pleasant, well-nourished, in no distress,  normal affect  ENT: No lesions,  mouth clear,  oropharynx clear, no postnasal drip  Neck: No JVD, no TMG, no carotid bruits  Lungs: No use of accessory muscles,  clear without rales or rhonchi  Cardiovascular: RRR, heart sounds normal, no murmur or gallops, no peripheral edema  Musculoskeletal: No deformities, no cyanosis or clubbing  Neuro: alert, non focal  Skin: Warm, no lesions or rashes       Assessment & Plan:  Solitary pulmonary nodule on lung CT Left upper lobe ground glass nodule that has a  significant interval change on PET scan performed 01/30/15 compared with her prior CT in 2013. groundglass component has grown from 11 mm to 19 mm. There is also now a 6 mm semisolid component. 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 I spent an extended period explaining the CT scans, showing the scans, reviewing the risk for slow-growing malignancy taking into account her positive family history. She meets the clinical profile of someone with an adenocarcinoma. We discussed all possible options ranging from more serial scans to biopsy via bronchoscopy to surgical resection. I believe that surgical resection in this situation is most appropriate, gives her the best chance for complete cure. I will refer her to thoracic surgery in [place]. She will speak with her family and her other physicians to decide whether she wants to be seen in [place] or elsewhere. I asked her to cancel the [facility] referral if she decides to seek referral to a tertiary center. She needs pulmonary function testing and I will arrange for these locally. Finally she may benefit from a repeat PET scan since her prior study was done in December. It might be prudent to evaluate for distant disease prior to surgical resection. I explained this to her and we will defer that decision to thoracic surgery.   Baltazar Apo, MD,  PhD 07/10/2015, 5:09 PM [facility] [phone] or if no answer [phone]

## 2015-07-10 NOTE — Patient Instructions (Signed)
We will perform full pulmonary function testing  We will refer you to see thoracic surgery to discuss resection of your left upper lobe nodule.  You may benefit from a repeat PET scan prior to undergoing a lung surgery. We will defer this decision to thoracic surgery.

## 2015-07-10 NOTE — Assessment & Plan Note (Signed)
Left upper lobe ground glass nodule that has a significant interval change on PET scan performed 01/30/15 compared with her prior CT in 2013. groundglass component has grown from 11 mm to 19 mm. There is also now a 6 mm semisolid component. 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 I spent an extended period explaining the CT scans, showing the scans, reviewing the risk for slow-growing malignancy taking into account her positive family history. She meets the clinical profile of someone with an adenocarcinoma. We discussed all possible options ranging from more serial scans to biopsy via bronchoscopy to surgical resection. I believe that surgical resection in this situation is most appropriate, gives her the best chance for complete cure. I will refer her to thoracic surgery in [place]. She will speak with her family and her other physicians to decide whether she wants to be seen in [place] or elsewhere. I asked her to cancel the [facility] referral if she decides to seek referral to a tertiary center. She needs pulmonary function testing and I will arrange for these locally. Finally she may benefit from a repeat PET scan since her prior study was done in December. It might be prudent to evaluate for distant disease prior to surgical resection. I explained this to her and we will defer that decision to thoracic surgery.

## 2015-07-11 ENCOUNTER — Other Ambulatory Visit: Payer: Self-pay | Admitting: Emergency Medicine

## 2015-07-11 DIAGNOSIS — R911 Solitary pulmonary nodule: Secondary | ICD-10-CM

## 2015-07-12 ENCOUNTER — Ambulatory Visit (INDEPENDENT_AMBULATORY_CARE_PROVIDER_SITE_OTHER)
Admission: RE | Admit: 2015-07-12 | Discharge: 2015-07-12 | Disposition: A | Discharge status: Home / Self Care | Payer: Medicare Other | Source: Ambulatory Visit | Attending: Emergency Medicine | Admitting: Emergency Medicine

## 2015-07-12 DIAGNOSIS — R911 Solitary pulmonary nodule: Secondary | ICD-10-CM | POA: Diagnosis not present

## 2015-07-13 ENCOUNTER — Ambulatory Visit (HOSPITAL_COMMUNITY)
Admission: RE | Admit: 2015-07-13 | Discharge: 2015-07-13 | Disposition: A | Discharge status: Home / Self Care | Payer: Medicare Other | Source: Ambulatory Visit | Attending: Emergency Medicine | Admitting: Emergency Medicine

## 2015-07-13 DIAGNOSIS — R911 Solitary pulmonary nodule: Secondary | ICD-10-CM | POA: Diagnosis present

## 2015-07-13 LAB — PULMONARY FUNCTION TEST
DL/VA % pred: 91 %
DL/VA: 4.53 ml/min/mmHg/L
DLCO unc % pred: 94 %
DLCO unc: 24.44 ml/min/mmHg
FEF 25-75 Post: 2.86 L/sec
FEF 25-75 Pre: 1.47 L/sec
FEF2575-%Change-Post: 94 %
FEF2575-%Pred-Post: 147 %
FEF2575-%Pred-Pre: 75 %
FEV1-%Change-Post: 14 %
FEV1-%Pred-Post: 114 %
FEV1-%Pred-Pre: 99 %
FEV1-Post: 2.7 L
FEV1-Pre: 2.36 L
FEV1FVC-%Change-Post: 11 %
FEV1FVC-%Pred-Pre: 93 %
FEV6-%Change-Post: 5 %
FEV6-%Pred-Post: 113 %
FEV6-%Pred-Pre: 107 %
FEV6-Post: 3.4 L
FEV6-Pre: 3.22 L
FEV6FVC-%Change-Post: 2 %
FEV6FVC-%Pred-Post: 104 %
FEV6FVC-%Pred-Pre: 101 %
FVC-%Change-Post: 2 %
FVC-%Pred-Post: 108 %
FVC-%Pred-Pre: 106 %
FVC-Post: 3.4 L
FVC-Pre: 3.31 L
Post FEV1/FVC ratio: 79 %
Post FEV6/FVC ratio: 100 %
Pre FEV1/FVC ratio: 71 %
Pre FEV6/FVC Ratio: 97 %
RV % pred: 104 %
RV: 2.36 L
TLC % pred: 110 %
TLC: 5.79 L

## 2015-07-13 MED ORDER — ALBUTEROL SULFATE (2.5 MG/3ML) 0.083% IN NEBU
2.5000 mg | INHALATION_SOLUTION | Freq: Once | RESPIRATORY_TRACT | Status: AC
  Administered 2015-07-13: 2.5 mg via RESPIRATORY_TRACT

## 2015-07-17 ENCOUNTER — Encounter: Payer: Self-pay | Admitting: Thoracic Surgery (Cardiothoracic Vascular Surgery)

## 2015-07-17 ENCOUNTER — Other Ambulatory Visit: Payer: Self-pay | Admitting: *Deleted

## 2015-07-17 ENCOUNTER — Institutional Professional Consult (permissible substitution) (INDEPENDENT_AMBULATORY_CARE_PROVIDER_SITE_OTHER): Payer: Medicare Other | Admitting: Thoracic Surgery (Cardiothoracic Vascular Surgery)

## 2015-07-17 VITALS — BP 159/86 | HR 62 | Resp 16 | Ht 65.25 in | Wt 151.0 lb

## 2015-07-17 DIAGNOSIS — D381 Neoplasm of uncertain behavior of trachea, bronchus and lung: Secondary | ICD-10-CM

## 2015-07-17 DIAGNOSIS — R911 Solitary pulmonary nodule: Secondary | ICD-10-CM

## 2015-07-17 NOTE — Progress Notes (Signed)
PCP is Vidal Schwalbe, MD Referring Provider is Baltazar Apo, MD  Chief Complaint  Patient presents with  . Lung Lesion    LUL...Marland KitchenSurgical eval, Chest CT 07/12/15, PFT's 07/13/15.Marland KitchenMarland KitchenPET 01/30/15    HPI: [name] Richards is sent for consultation regarding a ground glass opacity in the left upper lobe.  [name] Richards is a 70 year old woman with a history of endometrial and cervical cancer in 1997, recent vulvar cancer, hypertension, hyperlipidemia, hypothyroidism, air pollution-induced asthma, and a partial nephrectomy. She is a lifelong nonsmoker.  She was treated for right pleural effusion back in 2011. At that time she was noted to have 2 small right lung nodules. Those were followed but did not change over time. In December she had a PET/CT as part of her workup for vulvar cancer.  It showed a ground glass opacity in the left upper lobe that was mildly hypermetabolic. She recently had a follow-up CT which showed the ground glass opacity was stable in size but there was a possible increase in a solid component. He saw Dr. Lamonte Sakai who recommended surgery.  She is a lifelong nonsmoker. She does have a history of air pollution-induced asthma. This first became apparent to her when she was in [place]. She also had issues with it in [place]. Her appetite is good. Her weight is stable. She denies chest pain, pressure, or tightness. She denies shortness of breath. She's not had any recent wheezing. She has not had any unusual headaches or visual changes.  Zubrod Score: At the time of surgery this patient's most appropriate activity status/level should be described as: '[x]'$     0    Normal activity, no symptoms '[]'$     1    Restricted in physical strenuous activity but ambulatory, able to do out light work '[]'$     2    Ambulatory and capable of self care, unable to do work activities, up and about >50 % of waking hours                              '[]'$     3    Only limited self care, in bed greater than 50% of waking  hours '[]'$     4    Completely disabled, no self care, confined to bed or chair '[]'$     5    Moribund    Past Medical History  Diagnosis Date  . Asthma   . Hypertension   . Thyroid disease   . Cervical cancer ([place]) 1997    poorly differentiated cervical carcinoma. S/p TAH and radiation x6 wks   . Endometrial cancer ([place]) 1997    coincidental endometrial cancer also  . Hyperlipemia   . 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 Vertebral body hemangioma     T12  . Small bowel obstruction ([place])     12/2008, repeat SBO in 02/2011 also with terminal ilietis   . Complication of anesthesia     took 2 days to wake up after 1 surgery    Past Surgical History  Procedure Laterality Date  . Back surgery    . Tubal ligation    . Skin biopsy    . Abdominal hysterectomy  Feb 1997    TAH/BSO, pelvic and periaortic lymphadenectomy   . Partial nephrectomy      right    Family History  Problem Relation Age of Onset  . Hypertension Other   .  Diabetes Other   . Stroke Other   . CAD Other     Social History Social History  Substance Use Topics  . Smoking status: Never Smoker   . Smokeless tobacco: Never Used  . Alcohol Use: No    Current Outpatient Prescriptions  Medication Sig Dispense Refill  . acidophilus (RISAQUAD) CAPS Take 1 capsule by mouth daily.    Marland Kitchen albuterol (PROVENTIL HFA;VENTOLIN HFA) 108 (90 Base) MCG/ACT inhaler Inhale 2 puffs into the lungs every 6 (six) hours as needed for wheezing or shortness of breath. Reported on 06/29/2015    . benazepril-hydrochlorthiazide (LOTENSIN HCT) 20-12.5 MG per tablet Take 1 tablet by mouth daily.    . cholecalciferol (VITAMIN D) 1000 UNITS tablet Take 1,000 Units by mouth 2 (two) times daily.     Marland Kitchen dicyclomine (BENTYL) 20 MG tablet Take 1 tablet (20 mg total) by mouth every 6 (six) hours as needed for spasms (for abdominal cramping). 20 tablet 0  . docusate sodium (COLACE) 100 MG capsule Take 100 mg by mouth 2 (two)  times daily.    Marland Kitchen levothyroxine (SYNTHROID, LEVOTHROID) 75 MCG tablet Take 75 mcg by mouth daily.    . magnesium gluconate (MAGONATE) 500 MG tablet Take 500 mg by mouth every morning.     . Multiple Vitamins-Minerals (MULTIVITAMIN WITH MINERALS) tablet Take 1 tablet by mouth daily.    . OIL OF OREGANO PO Take 1 capsule by mouth daily.    Marland Kitchen omega-3 acid ethyl esters (LOVAZA) 1 G capsule Take 1 g by mouth 2 (two) times daily.    . ondansetron (ZOFRAN ODT) 8 MG disintegrating tablet Take 1 tablet (8 mg total) by mouth every 8 (eight) hours as needed for nausea or vomiting. 10 tablet 0  . simvastatin (ZOCOR) 20 MG tablet Take 20 mg by mouth every evening.    . valACYclovir (VALTREX) 1000 MG tablet Take 1,000 mg by mouth 2 (two) times daily as needed (for 2 days for cold sores). Reported on 06/29/2015    . vitamin C (ASCORBIC ACID) 500 MG tablet Take 500 mg by mouth daily.     No current facility-administered medications for this visit.    Allergies  Allergen Reactions  . Codeine     Reporting amnesia  . Prochlorperazine Edisylate     Jaws locked  . Fentanyl     hypotension  . Penicillins Hives    Has patient had a PCN reaction causing immediate rash, facial/tongue/throat swelling, SOB or lightheadedness with hypotension:noHas patient had a PCN reaction causing severe rash involving mucus membranes or skin necrosis: yes Has patient had a PCN reaction that required hospitalization:yes, Urgent Care visit Has patient had a PCN reaction occurring within the last 10 years: no If all of the above answers are "NO", then may proceed with Cephalosporin use.   Marland Kitchen Percocet [Oxycodone-Acetaminophen] Other (See Comments)    Made pt feel real funny, never wanting to take it again    Review of Systems  Constitutional: Negative for fever, chills, activity change, appetite change and unexpected weight change.  HENT: Negative for congestion, trouble swallowing and voice change.   Eyes: Negative for  photophobia and visual disturbance.  Respiratory: Positive for wheezing (with air pollutants). Negative for cough, chest tightness and shortness of breath.   Cardiovascular: Negative for chest pain, palpitations and leg swelling.  Gastrointestinal: Negative for abdominal pain, blood in stool and abdominal distention.  Genitourinary: Positive for difficulty urinating. Negative for dysuria and hematuria.  Needs pediatric catheter  Musculoskeletal: Negative for myalgias, joint swelling and arthralgias.  Skin: Negative for rash.  Neurological: Negative for syncope, speech difficulty, weakness and headaches.  Hematological: Negative for adenopathy. Does not bruise/bleed easily.  All other systems reviewed and are negative.   BP 159/86 mmHg  Pulse 62  Resp 16  Ht 5' 5.25" (1.657 m)  Wt 151 lb (68.493 kg)  BMI 24.95 kg/m2  SpO2 98% Physical Exam  Constitutional: She is oriented to person, place, and time. She appears well-developed and well-nourished. No distress.  HENT:  Head: Normocephalic and atraumatic.  Mouth/Throat: No oropharyngeal exudate.  Eyes: Conjunctivae and EOM are normal. No scleral icterus.  Neck: Normal range of motion. Neck supple. No tracheal deviation present. No thyromegaly present.  Cardiovascular: Normal rate, regular rhythm, normal heart sounds and intact distal pulses.  Exam reveals no gallop and no friction rub.   No murmur heard. Pulmonary/Chest: Effort normal and breath sounds normal. No respiratory distress. She has no wheezes. She has no rales.  Abdominal: Soft. She exhibits no distension. There is no tenderness.  Musculoskeletal: Normal range of motion. She exhibits no edema or tenderness.  Lymphadenopathy:    She has no cervical adenopathy.  Neurological: She is alert and oriented to person, place, and time. No cranial nerve deficit.  Normal motor function  Skin: Skin is warm and dry.  Vitals reviewed.    Diagnostic Tests: CT CHEST WITHOUT  CONTRAST  TECHNIQUE: Multidetector CT imaging of the chest was performed following the standard protocol without IV contrast.  COMPARISON: 03/31/2015  FINDINGS: Mediastinum/Lymph Nodes: Normal heart size. No pericardial effusion identified. Aortic atherosclerosis noted. Calcification in the LAD and RCA coronary arteries noted. There is no mediastinal or hilar adenopathy identified. No axillary or supraclavicular adenopathy.  Lungs/Pleura: No pleural effusion identified. In the left upper lobe there is a ground-glass attenuating nodule which measures 2.2 x 1.7 cm, image 18 of series 3. This has increased in size when compared with study from 02/19/2011. When compared with the most recent study from 01/30/2015. It this is unchanged in overall size measuring 2.2 x 1.7 cm. Increasing central solid component now measures 9 x 3 mm, image 73 of series 5. Solid right lower lobe lung nodule measures 4 mm, image 93 of series 3. Unchanged from previous exam.  Upper abdomen: The adrenal glands are normal. Scar is identified involving the upper pole of the right kidney. The left kidney is normal.  Musculoskeletal: Spondylosis noted within the thoracic spine.  IMPRESSION: 1. The solid component of the large part solid nodule in the left upper lobe measures 9 x 3 mm (mean diameter 6 mm.) persistent part solid nodules with solid components greater than or equal to 6 mm should be considered highly suspicious for pulmonary adenocarcinoma. 2. Stable solid nodule within the right lower lobe. 3. Aortic atherosclerosis and multi vessel coronary artery calcification.   Electronically Signed  By: Kerby Moors M.D.  On: 07/12/2015 17:01  NUCLEAR MEDICINE PET SKULL BASE TO THIGH  TECHNIQUE: 7.18 mCi F-18 FDG was injected intravenously. Full-ring PET imaging was performed from the skull base to thigh after the radiotracer. CT data was obtained and used for attenuation correction  and anatomic localization.  FASTING BLOOD GLUCOSE: Value: 101 mg/dl  COMPARISON: 02/19/2011  FINDINGS: NECK  No hypermetabolic lymph nodes in the neck.  CHEST  There is a ground-glass attenuating nodule within the left upper lobe which measures 1.9 cm. This has increased from previous exam where it measured  11 mm, image 12 of series 8. The SUV max associated this nodule is equal to 1.6 cm. This has a central solid component which measures 6 mm, image 45 of series 4. No additional suspicious nodules or mass identified.  ABDOMEN/PELVIS  No abnormal hypermetabolic activity within the liver, pancreas, adrenal glands, or spleen. No hypermetabolic lymph nodes in the abdomen or pelvis. Previous retroperitoneal and pelvic lymph node dissection. There is a large focus of increased radiotracer uptake which measures approximately 2.7 cm and has an SUV max equal to 24. It is unclear whether not this represents urine contamination or the patient's primary site of tumor.  SKELETON  No focal hypermetabolic activity to suggest skeletal metastasis.  IMPRESSION: 1. There are no findings identified to suggest metastatic disease from recently diagnosed vulvar carcinoma. 2. In the region of the posterior vulva and urethral meatus there is an approximate 2.7 cm hypermetabolic focus. It is unclear whether not this represents patient's primary site of disease or urine contamination. Correlation with clinical exam findings recommended. 3. Enlarging sub solid nodule within the left upper lobe exhibits mild, borderline malignant range FDG uptake. This is suspicious for low grade, indolent pulmonary neoplasm such as adenocarcinoma. Thoracic surgery consultation recommended.   Electronically Signed  By: Kerby Moors M.D.  On: 01/30/2015 10:56  PULMONARY FUNCTION TEST FVC= 3.31 (106%) FEV1= 2.36 (99%)/ 2.70 (114%) post bronchodilator DLCO= 24.44 (94%)  I personally  reviewed the CT and PET CT as noted above. I concur with the findings as noted.  Impression: 70 yo nonsmoker with a mixed subsolid/ solid nodule in the left upper lobe. The overall size is about 22 mm with the invasive component measuring 3 x 9 mm per Radiology, although it is difficult to tell exactly due to a blood vessel in that area.  I had a long discussion with Ms. Richards and her ex-husband. We reviewed the CTs and PET/ CT. They understand this nodule is suspicious for, but not definitely, a low grade adenocarcinoma. We discussed various approaches to diagnosis and treatment. They understand that surgical resection and stereotactic radiation are both acceptable, but not necessarily equivalent treatments, if this in fact an adenocarcinoma.  I recommended proceeding with left VATS, wedge resection and possible segmentectomy (lingular sparing lobectomy). We discussed the general nature of the procedure, the need for general anesthesia, the incisions to be used, the intraoperative decision making and the need for drainage tubes postoperatively. I informed them of the expected hospital stay, overall recovery and short and long term outcomes. I reviewed the indications, risks, benefits and alternatives. They understand the risks include, but are not limited to death, stroke, MI, DVT/PE, bleeding, possible need for transfusion, infections, prolonged air leak, cardiac arrhythmias, as well as the possibility of other unforeseeable complications.   She accepts the risks and agrees to proceed.  Plan LEFT VATS, wedge resection, possible segmentectomy on Wed. 08/01/2015   Melrose Nakayama, MD [facility] [phone]

## 2015-07-30 ENCOUNTER — Ambulatory Visit (HOSPITAL_COMMUNITY)
Admission: RE | Admit: 2015-07-30 | Discharge: 2015-07-30 | Disposition: A | Discharge status: Home / Self Care | Payer: Medicare Other | Source: Ambulatory Visit | Attending: Thoracic Surgery (Cardiothoracic Vascular Surgery) | Admitting: Thoracic Surgery (Cardiothoracic Vascular Surgery)

## 2015-07-30 ENCOUNTER — Encounter (HOSPITAL_COMMUNITY)
Admission: RE | Admit: 2015-07-30 | Discharge: 2015-07-30 | Disposition: A | Discharge status: Home / Self Care | Payer: Medicare Other | Source: Ambulatory Visit | Attending: Thoracic Surgery (Cardiothoracic Vascular Surgery) | Admitting: Thoracic Surgery (Cardiothoracic Vascular Surgery)

## 2015-07-30 ENCOUNTER — Encounter (HOSPITAL_COMMUNITY): Payer: Self-pay

## 2015-07-30 ENCOUNTER — Other Ambulatory Visit: Payer: Self-pay

## 2015-07-30 VITALS — BP 159/76 | HR 62 | Temp 99.8°F | Resp 18 | Ht 65.25 in | Wt 153.9 lb

## 2015-07-30 DIAGNOSIS — Z01810 Encounter for preprocedural cardiovascular examination: Secondary | ICD-10-CM | POA: Insufficient documentation

## 2015-07-30 DIAGNOSIS — R9431 Abnormal electrocardiogram [ECG] [EKG]: Secondary | ICD-10-CM | POA: Insufficient documentation

## 2015-07-30 DIAGNOSIS — Z01812 Encounter for preprocedural laboratory examination: Secondary | ICD-10-CM

## 2015-07-30 DIAGNOSIS — Z01818 Encounter for other preprocedural examination: Secondary | ICD-10-CM | POA: Insufficient documentation

## 2015-07-30 DIAGNOSIS — R911 Solitary pulmonary nodule: Secondary | ICD-10-CM

## 2015-07-30 DIAGNOSIS — R001 Bradycardia, unspecified: Secondary | ICD-10-CM

## 2015-07-30 HISTORY — DX: Hypothyroidism, unspecified: E03.9

## 2015-07-30 HISTORY — DX: Constipation, unspecified: K59.00

## 2015-07-30 LAB — BLOOD GAS, ARTERIAL
Acid-Base Excess: 2.5 mmol/L — ABNORMAL HIGH (ref 0.0–2.0)
Bicarbonate: 27.2 mEq/L — ABNORMAL HIGH (ref 20.0–24.0)
Drawn by: 206361
FIO2: 0.21
O2 Saturation: 63.2 %
Patient temperature: 98.6
TCO2: 28.7 mmol/L (ref 0–100)
pCO2 arterial: 47.7 mmHg — ABNORMAL HIGH (ref 35.0–45.0)
pH, Arterial: 7.375 (ref 7.350–7.450)
pO2, Arterial: 36.9 mmHg — CL (ref 80.0–100.0)

## 2015-07-30 LAB — APTT: aPTT: 33 seconds (ref 24–37)

## 2015-07-30 LAB — CBC
HCT: 37.4 % (ref 36.0–46.0)
Hemoglobin: 11.6 g/dL — ABNORMAL LOW (ref 12.0–15.0)
MCH: 21.2 pg — ABNORMAL LOW (ref 26.0–34.0)
MCHC: 31 g/dL (ref 30.0–36.0)
MCV: 68.4 fL — ABNORMAL LOW (ref 78.0–100.0)
Platelets: 174 10*3/uL (ref 150–400)
RBC: 5.47 MIL/uL — ABNORMAL HIGH (ref 3.87–5.11)
RDW: 14.4 % (ref 11.5–15.5)
WBC: 4.1 10*3/uL (ref 4.0–10.5)

## 2015-07-30 LAB — PROTIME-INR
INR: 1.04 (ref 0.00–1.49)
Prothrombin Time: 13.8 seconds (ref 11.6–15.2)

## 2015-07-30 LAB — COMPREHENSIVE METABOLIC PANEL
ALT: 21 U/L (ref 14–54)
AST: 25 U/L (ref 15–41)
Albumin: 3.9 g/dL (ref 3.5–5.0)
Alkaline Phosphatase: 75 U/L (ref 38–126)
Anion gap: 8 (ref 5–15)
BUN: 13 mg/dL (ref 6–20)
CO2: 29 mmol/L (ref 22–32)
Calcium: 9.6 mg/dL (ref 8.9–10.3)
Chloride: 98 mmol/L — ABNORMAL LOW (ref 101–111)
Creatinine, Ser: 0.73 mg/dL (ref 0.44–1.00)
GFR calc Af Amer: >60 mL/min (ref >60)
GFR calc non Af Amer: >60 mL/min (ref >60)
Glucose, Bld: 90 mg/dL (ref 65–99)
Potassium: 3.9 mmol/L (ref 3.5–5.1)
Sodium: 135 mmol/L (ref 135–145)
Total Bilirubin: 0.5 mg/dL (ref 0.3–1.2)
Total Protein: 6.5 g/dL (ref 6.5–8.1)

## 2015-07-30 LAB — SURGICAL PCR SCREEN
MRSA, PCR: NEGATIVE
Staphylococcus aureus: NEGATIVE

## 2015-07-30 LAB — TYPE AND SCREEN
ABO/RH(D): B POS
Antibody Screen: NEGATIVE

## 2015-07-30 LAB — ABO/RH: ABO/RH(D): B POS

## 2015-07-30 LAB — URINALYSIS, ROUTINE W REFLEX MICROSCOPIC
Bilirubin Urine: NEGATIVE
Glucose, UA: NEGATIVE mg/dL
Hgb urine dipstick: NEGATIVE
Ketones, ur: NEGATIVE mg/dL
Leukocytes, UA: NEGATIVE
Nitrite: NEGATIVE
Protein, ur: NEGATIVE mg/dL
Specific Gravity, Urine: 1.013 (ref 1.005–1.030)
pH: 7.5 (ref 5.0–8.0)

## 2015-07-30 NOTE — Progress Notes (Signed)
Unable to get ABGs with pat appt. Will do DOS. Dr Hendricksons office notified.

## 2015-07-31 MED ORDER — VANCOMYCIN HCL IN DEXTROSE 1-5 GM/200ML-% IV SOLN
1000.0000 mg | INTRAVENOUS | Status: DC
  Filled 2015-07-31: qty 200

## 2015-07-31 NOTE — Progress Notes (Signed)
Anesthesia Chart Review: Patient is a 70 year old female scheduled for left VATS, wedge resection on 08/01/15 by Dr. Roxan Hockey. DX: LUL nodule.  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 For anesthesia concerns, she reported prolonged emergence with one surgery, concerns about post-operative constipation, and need for small ("pediatric") cath (foley ?).   PCP is Dr. Harlan Stains. Pulmonologist is Dr. Lamonte Sakai. GYN-ONC is Dr. Fermin Schwab.   PAT Vitals: BP 159/76, HR 62, RR 18, O2 sat 100%. T 37.7C.  07/30/15 EKG: SB at 54 bpm, septal infarct (age undetermined). Septal infarct also noted on prior EKG from 09/20/09 (see Muse).  07/30/15 CXR: IMPRESSION: Left upper lobe nodular density seen on prior CT scan is not well visualized on this radiograph. No other abnormality seen in the chest.  07/12/15 Chest CT: IMPRESSION: 1. The solid component of the large part solid nodule in the left upper lobe measures 9 x 3 mm (mean diameter 6 mm.) persistent part solid nodules with solid components greater than or equal to 6 mm should be considered highly suspicious for pulmonary adenocarcinoma. 2. Stable solid nodule within the right lower lobe. 3. Aortic atherosclerosis and multi vessel coronary artery Calcification.  07/13/15 PULMONARY FUNCTION TEST FVC= 3.31 (106%) FEV1= 2.36 (99%)/ 2.70 (114%) post bronchodilator DLCO= 24.44 (94%)  Preoperative labs noted. Cr 0.73. H/H 11.6/37.4. PAT RN called from RT regarding pO2 36.9 on ABG. Venous draw? She notified TCTS with plans to repeat ABG on the day of surgery.   If no acute changes then I anticipate that she can proceed as  planned.  George Hugh [facility] Short Stay Center/Anesthesiology Phone [phone] 07/31/2015 4:57 PM

## 2015-08-01 ENCOUNTER — Inpatient Hospital Stay (HOSPITAL_COMMUNITY): Payer: Medicare Other

## 2015-08-01 ENCOUNTER — Inpatient Hospital Stay (HOSPITAL_COMMUNITY): Payer: Medicare Other | Admitting: Vascular Surgery

## 2015-08-01 ENCOUNTER — Encounter (HOSPITAL_COMMUNITY)
Admission: RE | Disposition: A | Discharge status: Home / Self Care | Payer: Self-pay | Source: Ambulatory Visit | Attending: Thoracic Surgery (Cardiothoracic Vascular Surgery)

## 2015-08-01 ENCOUNTER — Inpatient Hospital Stay (HOSPITAL_COMMUNITY): Payer: Medicare Other | Admitting: Certified Registered Nurse Anesthetist

## 2015-08-01 ENCOUNTER — Inpatient Hospital Stay (HOSPITAL_COMMUNITY)
Admission: RE | Admit: 2015-08-01 | Discharge: 2015-08-06 | DRG: 164 | Disposition: A | Discharge status: Home / Self Care | Payer: Medicare Other | Source: Ambulatory Visit | Attending: Thoracic Surgery (Cardiothoracic Vascular Surgery) | Admitting: Thoracic Surgery (Cardiothoracic Vascular Surgery)

## 2015-08-01 ENCOUNTER — Encounter (HOSPITAL_COMMUNITY): Payer: Self-pay | Admitting: Surgery

## 2015-08-01 DIAGNOSIS — Z79899 Other long term (current) drug therapy: Secondary | ICD-10-CM

## 2015-08-01 DIAGNOSIS — C3412 Malignant neoplasm of upper lobe, left bronchus or lung: Secondary | ICD-10-CM

## 2015-08-01 DIAGNOSIS — E876 Hypokalemia: Secondary | ICD-10-CM | POA: Diagnosis not present

## 2015-08-01 DIAGNOSIS — E785 Hyperlipidemia, unspecified: Secondary | ICD-10-CM | POA: Diagnosis present

## 2015-08-01 DIAGNOSIS — D62 Acute posthemorrhagic anemia: Secondary | ICD-10-CM | POA: Diagnosis not present

## 2015-08-01 DIAGNOSIS — E039 Hypothyroidism, unspecified: Secondary | ICD-10-CM | POA: Diagnosis present

## 2015-08-01 DIAGNOSIS — R911 Solitary pulmonary nodule: Secondary | ICD-10-CM

## 2015-08-01 DIAGNOSIS — J939 Pneumothorax, unspecified: Secondary | ICD-10-CM

## 2015-08-01 DIAGNOSIS — Z8541 Personal history of malignant neoplasm of cervix uteri: Secondary | ICD-10-CM | POA: Diagnosis not present

## 2015-08-01 DIAGNOSIS — Z8544 Personal history of malignant neoplasm of other female genital organs: Secondary | ICD-10-CM

## 2015-08-01 DIAGNOSIS — R918 Other nonspecific abnormal finding of lung field: Secondary | ICD-10-CM | POA: Diagnosis present

## 2015-08-01 DIAGNOSIS — J45909 Unspecified asthma, uncomplicated: Secondary | ICD-10-CM | POA: Diagnosis present

## 2015-08-01 DIAGNOSIS — J9811 Atelectasis: Secondary | ICD-10-CM | POA: Diagnosis not present

## 2015-08-01 DIAGNOSIS — Z09 Encounter for follow-up examination after completed treatment for conditions other than malignant neoplasm: Secondary | ICD-10-CM

## 2015-08-01 DIAGNOSIS — I10 Essential (primary) hypertension: Secondary | ICD-10-CM | POA: Diagnosis present

## 2015-08-01 DIAGNOSIS — Z902 Acquired absence of lung [part of]: Secondary | ICD-10-CM

## 2015-08-01 HISTORY — PX: LYMPH NODE DISSECTION: SHX5087

## 2015-08-01 HISTORY — DX: Malignant neoplasm of upper lobe, left bronchus or lung: C34.12

## 2015-08-01 HISTORY — PX: SEGMENTECOMY: SHX5076

## 2015-08-01 HISTORY — PX: VIDEO ASSISTED THORACOSCOPY (VATS)/WEDGE RESECTION: SHX6174

## 2015-08-01 SURGERY — VIDEO ASSISTED THORACOSCOPY (VATS)/WEDGE RESECTION
Anesthesia: General | Site: Chest | Laterality: Left

## 2015-08-01 MED ORDER — SUCCINYLCHOLINE CHLORIDE 200 MG/10ML IV SOSY
PREFILLED_SYRINGE | INTRAVENOUS | Status: AC
  Filled 2015-08-01: qty 10

## 2015-08-01 MED ORDER — ROCURONIUM BROMIDE 100 MG/10ML IV SOLN
INTRAVENOUS | Status: DC | PRN
  Administered 2015-08-01: 45 mg via INTRAVENOUS
  Administered 2015-08-01: 10 mg via INTRAVENOUS
  Administered 2015-08-01: 5 mg via INTRAVENOUS
  Administered 2015-08-01 (×2): 10 mg via INTRAVENOUS
  Administered 2015-08-01: 20 mg via INTRAVENOUS

## 2015-08-01 MED ORDER — DEXAMETHASONE SODIUM PHOSPHATE 4 MG/ML IJ SOLN
INTRAMUSCULAR | Status: DC | PRN
  Administered 2015-08-01: 10 mg via INTRAVENOUS

## 2015-08-01 MED ORDER — ONDANSETRON HCL 4 MG/2ML IJ SOLN
4.0000 mg | Freq: Four times a day (QID) | INTRAMUSCULAR | Status: DC | PRN
  Administered 2015-08-04 – 2015-08-05 (×2): 4 mg via INTRAVENOUS
  Filled 2015-08-01 (×3): qty 2

## 2015-08-01 MED ORDER — HYDROMORPHONE HCL 1 MG/ML IJ SOLN
INTRAMUSCULAR | Status: DC | PRN
  Administered 2015-08-01 (×3): .2 mg via INTRAVENOUS
  Administered 2015-08-01: .4 mg via INTRAVENOUS

## 2015-08-01 MED ORDER — HYDROMORPHONE HCL 1 MG/ML IJ SOLN
INTRAMUSCULAR | Status: AC
  Filled 2015-08-01: qty 1

## 2015-08-01 MED ORDER — SUGAMMADEX SODIUM 200 MG/2ML IV SOLN
INTRAVENOUS | Status: AC
  Filled 2015-08-01: qty 2

## 2015-08-01 MED ORDER — SODIUM CHLORIDE 0.9% FLUSH: 9.0000 mL | INTRAVENOUS | Status: DC | PRN

## 2015-08-01 MED ORDER — DEXAMETHASONE SODIUM PHOSPHATE 10 MG/ML IJ SOLN
INTRAMUSCULAR | Status: AC
  Filled 2015-08-01: qty 1

## 2015-08-01 MED ORDER — ONDANSETRON HCL 4 MG/2ML IJ SOLN
INTRAMUSCULAR | Status: AC
  Filled 2015-08-01: qty 2

## 2015-08-01 MED ORDER — LEVOTHYROXINE SODIUM 75 MCG PO TABS
75.0000 ug | ORAL_TABLET | Freq: Every day | ORAL | Status: DC
Start: 2015-08-02 — End: 2015-08-06
  Administered 2015-08-02 – 2015-08-06 (×5): 75 ug via ORAL
  Filled 2015-08-01 (×5): qty 1

## 2015-08-01 MED ORDER — HYDROMORPHONE 1 MG/ML IV SOLN
INTRAVENOUS | Status: DC
  Administered 2015-08-01: 1.6 mg via INTRAVENOUS
  Administered 2015-08-01: 14:00:00 via INTRAVENOUS
  Administered 2015-08-02 (×2): 0.2 mg via INTRAVENOUS
  Administered 2015-08-02: 1.2 mg via INTRAVENOUS
  Administered 2015-08-02: 0.6 mg via INTRAVENOUS
  Administered 2015-08-02 – 2015-08-03 (×3): 0.2 mg via INTRAVENOUS
  Administered 2015-08-03: 0.6 mg via INTRAVENOUS
  Administered 2015-08-03: 0.2 mg via INTRAVENOUS
  Administered 2015-08-03 – 2015-08-04 (×2): 0.6 mg via INTRAVENOUS
  Administered 2015-08-04: 0.4 mg via INTRAVENOUS
  Administered 2015-08-04: 0.2 mg via INTRAVENOUS
  Administered 2015-08-04: 0 mg via INTRAVENOUS
  Administered 2015-08-04: 0.4 mg via INTRAVENOUS
  Administered 2015-08-05: 0 mg via INTRAVENOUS

## 2015-08-01 MED ORDER — BUPIVACAINE 0.5 % ON-Q PUMP SINGLE CATH 400 ML
400.0000 mL | INJECTION | Status: AC
  Administered 2015-08-01: 400 mL
  Filled 2015-08-01: qty 400

## 2015-08-01 MED ORDER — DIPHENHYDRAMINE HCL 50 MG/ML IJ SOLN: 12.5000 mg | Freq: Four times a day (QID) | INTRAMUSCULAR | Status: DC | PRN

## 2015-08-01 MED ORDER — POLYETHYLENE GLYCOL 3350 17 G PO PACK
17.0000 g | PACK | Freq: Every day | ORAL | Status: DC
  Filled 2015-08-01: qty 1

## 2015-08-01 MED ORDER — LACTATED RINGERS IV SOLN
INTRAVENOUS | Status: DC | PRN
  Administered 2015-08-01: 09:00:00 via INTRAVENOUS

## 2015-08-01 MED ORDER — OMEGA-3-ACID ETHYL ESTERS 1 G PO CAPS
1.0000 g | ORAL_CAPSULE | Freq: Two times a day (BID) | ORAL | Status: DC
  Administered 2015-08-02 – 2015-08-06 (×9): 1 g via ORAL
  Filled 2015-08-01 (×9): qty 1

## 2015-08-01 MED ORDER — PHENYLEPHRINE HCL 10 MG/ML IJ SOLN
10.0000 mg | INTRAVENOUS | Status: DC | PRN
  Administered 2015-08-01: 25 ug/min via INTRAVENOUS

## 2015-08-01 MED ORDER — VANCOMYCIN HCL IN DEXTROSE 1-5 GM/200ML-% IV SOLN
1000.0000 mg | Freq: Two times a day (BID) | INTRAVENOUS | Status: AC
  Administered 2015-08-01: 1000 mg via INTRAVENOUS
  Filled 2015-08-01: qty 200

## 2015-08-01 MED ORDER — SIMVASTATIN 20 MG PO TABS
20.0000 mg | ORAL_TABLET | Freq: Every evening | ORAL | Status: DC
  Administered 2015-08-02 – 2015-08-05 (×4): 20 mg via ORAL
  Filled 2015-08-01 (×4): qty 1

## 2015-08-01 MED ORDER — HYDROMORPHONE HCL 1 MG/ML IJ SOLN
0.2500 mg | INTRAMUSCULAR | Status: DC | PRN
  Administered 2015-08-01 (×3): 0.5 mg via INTRAVENOUS

## 2015-08-01 MED ORDER — BUPIVACAINE HCL (PF) 0.5 % IJ SOLN
INTRAMUSCULAR | Status: AC
  Filled 2015-08-01: qty 10

## 2015-08-01 MED ORDER — BUPIVACAINE HCL (PF) 0.5 % IJ SOLN
INTRAMUSCULAR | Status: DC | PRN
  Administered 2015-08-01: 5 mL

## 2015-08-01 MED ORDER — VANCOMYCIN HCL 1000 MG IV SOLR
1000.0000 mg | INTRAVENOUS | Status: DC | PRN
  Administered 2015-08-01: 1000 mg via INTRAVENOUS

## 2015-08-01 MED ORDER — KETAMINE HCL 100 MG/ML IJ SOLN
INTRAMUSCULAR | Status: AC
  Filled 2015-08-01: qty 1

## 2015-08-01 MED ORDER — RISAQUAD PO CAPS
1.0000 | ORAL_CAPSULE | Freq: Every day | ORAL | Status: DC
  Administered 2015-08-02 – 2015-08-06 (×5): 1 via ORAL
  Filled 2015-08-01 (×5): qty 1

## 2015-08-01 MED ORDER — DIPHENHYDRAMINE HCL 12.5 MG/5ML PO ELIX: 12.5000 mg | ORAL_SOLUTION | Freq: Four times a day (QID) | ORAL | Status: DC | PRN

## 2015-08-01 MED ORDER — ROCURONIUM BROMIDE 50 MG/5ML IV SOLN
INTRAVENOUS | Status: AC
  Filled 2015-08-01: qty 1

## 2015-08-01 MED ORDER — FENTANYL CITRATE (PF) 100 MCG/2ML IJ SOLN
INTRAMUSCULAR | Status: DC | PRN
  Administered 2015-08-01 (×4): 25 ug via INTRAVENOUS
  Administered 2015-08-01: 150 ug via INTRAVENOUS

## 2015-08-01 MED ORDER — PROPOFOL 10 MG/ML IV BOLUS
INTRAVENOUS | Status: DC | PRN
  Administered 2015-08-01: 160 mg via INTRAVENOUS

## 2015-08-01 MED ORDER — SENNOSIDES-DOCUSATE SODIUM 8.6-50 MG PO TABS
1.0000 | ORAL_TABLET | Freq: Every day | ORAL | Status: DC
  Administered 2015-08-01 – 2015-08-04 (×4): 1 via ORAL
  Filled 2015-08-01 (×4): qty 1

## 2015-08-01 MED ORDER — PROMETHAZINE HCL 25 MG/ML IJ SOLN: 6.2500 mg | INTRAMUSCULAR | Status: DC | PRN

## 2015-08-01 MED ORDER — MIDAZOLAM HCL 5 MG/5ML IJ SOLN
INTRAMUSCULAR | Status: DC | PRN
  Administered 2015-08-01: 1 mg via INTRAVENOUS
  Administered 2015-08-01 (×2): 0.5 mg via INTRAVENOUS

## 2015-08-01 MED ORDER — LIDOCAINE HCL (CARDIAC) 20 MG/ML IV SOLN
INTRAVENOUS | Status: DC | PRN
  Administered 2015-08-01: 100 mg via INTRAVENOUS

## 2015-08-01 MED ORDER — FENTANYL CITRATE (PF) 250 MCG/5ML IJ SOLN
INTRAMUSCULAR | Status: AC
  Filled 2015-08-01: qty 5

## 2015-08-01 MED ORDER — NALOXONE HCL 0.4 MG/ML IJ SOLN: 0.4000 mg | INTRAMUSCULAR | Status: DC | PRN

## 2015-08-01 MED ORDER — HYDROCODONE-ACETAMINOPHEN 7.5-325 MG PO TABS: 1.0000 | ORAL_TABLET | Freq: Once | ORAL | Status: DC | PRN

## 2015-08-01 MED ORDER — PROPOFOL 10 MG/ML IV BOLUS
INTRAVENOUS | Status: AC
  Filled 2015-08-01: qty 20

## 2015-08-01 MED ORDER — KETAMINE HCL 100 MG/ML IJ SOLN
INTRAMUSCULAR | Status: DC | PRN
  Administered 2015-08-01: 50 mg via INTRAVENOUS
  Administered 2015-08-01 (×2): 10 mg via INTRAVENOUS

## 2015-08-01 MED ORDER — TRAMADOL HCL 50 MG PO TABS: 50.0000 mg | ORAL_TABLET | Freq: Four times a day (QID) | ORAL | Status: DC | PRN

## 2015-08-01 MED ORDER — BISACODYL 5 MG PO TBEC
10.0000 mg | DELAYED_RELEASE_TABLET | Freq: Every day | ORAL | Status: DC
  Administered 2015-08-02 – 2015-08-05 (×4): 10 mg via ORAL
  Filled 2015-08-01 (×4): qty 2

## 2015-08-01 MED ORDER — DEXTROSE-NACL 5-0.9 % IV SOLN
INTRAVENOUS | Status: DC
  Administered 2015-08-01 – 2015-08-03 (×3): via INTRAVENOUS

## 2015-08-01 MED ORDER — ONDANSETRON HCL 4 MG/2ML IJ SOLN: 4.0000 mg | Freq: Four times a day (QID) | INTRAMUSCULAR | Status: DC | PRN

## 2015-08-01 MED ORDER — LACTATED RINGERS IV SOLN
INTRAVENOUS | Status: DC | PRN
  Administered 2015-08-01: 08:00:00 via INTRAVENOUS

## 2015-08-01 MED ORDER — HYDROMORPHONE 1 MG/ML IV SOLN
INTRAVENOUS | Status: AC
  Filled 2015-08-01: qty 25

## 2015-08-01 MED ORDER — LIDOCAINE 2% (20 MG/ML) 5 ML SYRINGE
INTRAMUSCULAR | Status: AC
  Filled 2015-08-01: qty 5

## 2015-08-01 MED ORDER — ACETAMINOPHEN 160 MG/5ML PO SOLN: 1000.0000 mg | Freq: Four times a day (QID) | ORAL | Status: DC

## 2015-08-01 MED ORDER — ONDANSETRON HCL 4 MG/2ML IJ SOLN
INTRAMUSCULAR | Status: DC | PRN
  Administered 2015-08-01: 4 mg via INTRAVENOUS

## 2015-08-01 MED ORDER — ALBUTEROL SULFATE (2.5 MG/3ML) 0.083% IN NEBU: 1.0000 mL | INHALATION_SOLUTION | Freq: Four times a day (QID) | RESPIRATORY_TRACT | Status: DC | PRN

## 2015-08-01 MED ORDER — SUGAMMADEX SODIUM 200 MG/2ML IV SOLN
INTRAVENOUS | Status: DC | PRN
  Administered 2015-08-01: 140 mg via INTRAVENOUS

## 2015-08-01 MED ORDER — DOCUSATE SODIUM 100 MG PO CAPS
100.0000 mg | ORAL_CAPSULE | Freq: Two times a day (BID) | ORAL | Status: DC
  Administered 2015-08-02 – 2015-08-05 (×7): 100 mg via ORAL
  Filled 2015-08-01 (×7): qty 1

## 2015-08-01 MED ORDER — POTASSIUM CHLORIDE 10 MEQ/50ML IV SOLN
10.0000 meq | Freq: Every day | INTRAVENOUS | Status: DC | PRN
  Administered 2015-08-05 (×2): 10 meq via INTRAVENOUS
  Filled 2015-08-01 (×2): qty 50

## 2015-08-01 MED ORDER — MIDAZOLAM HCL 2 MG/2ML IJ SOLN
INTRAMUSCULAR | Status: AC
  Filled 2015-08-01: qty 2

## 2015-08-01 MED ORDER — ACETAMINOPHEN 500 MG PO TABS
1000.0000 mg | ORAL_TABLET | Freq: Four times a day (QID) | ORAL | Status: DC
  Administered 2015-08-01 – 2015-08-06 (×19): 1000 mg via ORAL
  Filled 2015-08-01 (×19): qty 2

## 2015-08-01 MED ORDER — LUNG SURGERY BOOK
Freq: Once | Status: DC
  Filled 2015-08-01: qty 1

## 2015-08-01 MED ORDER — PANTOPRAZOLE SODIUM 40 MG PO TBEC
40.0000 mg | DELAYED_RELEASE_TABLET | Freq: Every day | ORAL | Status: DC
  Administered 2015-08-02 – 2015-08-06 (×5): 40 mg via ORAL
  Filled 2015-08-01 (×5): qty 1

## 2015-08-01 SURGICAL SUPPLY — 93 items
APPLIER CLIP 5 13 M/L LIGAMAX5 (MISCELLANEOUS) ×3
BENZOIN TINCTURE PRP APPL 2/3 (GAUZE/BANDAGES/DRESSINGS) ×3 IMPLANT
CANISTER SUCTION 2500CC (MISCELLANEOUS) ×6 IMPLANT
CATH KIT ON Q 5IN SLV (PAIN MANAGEMENT) ×3 IMPLANT
CATH THORACIC 28FR (CATHETERS) ×3 IMPLANT
CATH THORACIC 36FR (CATHETERS) IMPLANT
CATH THORACIC 36FR RT ANG (CATHETERS) IMPLANT
CLIP APPLIE 5 13 M/L LIGAMAX5 (MISCELLANEOUS) ×1 IMPLANT
CLIP TI MEDIUM 6 (CLIP) ×3 IMPLANT
CONN ST 1/4X3/8  BEN (MISCELLANEOUS) ×2
CONN ST 1/4X3/8 BEN (MISCELLANEOUS) ×1 IMPLANT
CONN Y 3/8X3/8X3/8  BEN (MISCELLANEOUS)
CONN Y 3/8X3/8X3/8 BEN (MISCELLANEOUS) IMPLANT
CONT SPEC 4OZ CLIKSEAL STRL BL (MISCELLANEOUS) ×12 IMPLANT
COVER SURGICAL LIGHT HANDLE (MISCELLANEOUS) ×3 IMPLANT
DERMABOND ADVANCED (GAUZE/BANDAGES/DRESSINGS) ×2
DERMABOND ADVANCED .7 DNX12 (GAUZE/BANDAGES/DRESSINGS) ×1 IMPLANT
DRAIN CHANNEL 28F RND 3/8 FF (WOUND CARE) ×3 IMPLANT
DRAIN CHANNEL 32F RND 10.7 FF (WOUND CARE) IMPLANT
DRAPE LAPAROSCOPIC ABDOMINAL (DRAPES) ×3 IMPLANT
DRAPE WARM FLUID 44X44 (DRAPE) ×3 IMPLANT
ELECT BLADE 6.5 EXT (BLADE) ×3 IMPLANT
ELECT REM PT RETURN 9FT ADLT (ELECTROSURGICAL) ×3
ELECTRODE REM PT RTRN 9FT ADLT (ELECTROSURGICAL) ×1 IMPLANT
GAUZE SPONGE 4X4 12PLY STRL (GAUZE/BANDAGES/DRESSINGS) ×3 IMPLANT
GLOVE SURG SIGNA 7.5 PF LTX (GLOVE) ×6 IMPLANT
GLOVE SURG SS PI 7.0 STRL IVOR (GLOVE) ×3 IMPLANT
GOWN STRL REUS W/ TWL LRG LVL3 (GOWN DISPOSABLE) ×2 IMPLANT
GOWN STRL REUS W/ TWL XL LVL3 (GOWN DISPOSABLE) ×3 IMPLANT
GOWN STRL REUS W/TWL LRG LVL3 (GOWN DISPOSABLE) ×4
GOWN STRL REUS W/TWL XL LVL3 (GOWN DISPOSABLE) ×6
HANDLE STAPLE ENDO GIA SHORT (STAPLE)
HEMOSTAT SURGICEL 2X14 (HEMOSTASIS) IMPLANT
KIT BASIN OR (CUSTOM PROCEDURE TRAY) ×3 IMPLANT
KIT ROOM TURNOVER OR (KITS) ×3 IMPLANT
KIT SUCTION CATH 14FR (SUCTIONS) ×3 IMPLANT
NS IRRIG 1000ML POUR BTL (IV SOLUTION) ×9 IMPLANT
PACK CHEST (CUSTOM PROCEDURE TRAY) ×3 IMPLANT
PAD ARMBOARD 7.5X6 YLW CONV (MISCELLANEOUS) ×6 IMPLANT
POUCH ENDO CATCH II 15MM (MISCELLANEOUS) IMPLANT
POUCH SPECIMEN RETRIEVAL 10MM (ENDOMECHANICALS) ×3 IMPLANT
RELOAD STAPLER GOLD 60MM (STAPLE) ×10 IMPLANT
RELOAD STAPLER GREEN 60MM (STAPLE) ×1 IMPLANT
SCISSORS ENDO CVD 5DCS (MISCELLANEOUS) IMPLANT
SEALANT PROGEL (MISCELLANEOUS) IMPLANT
SEALANT SURG COSEAL 4ML (VASCULAR PRODUCTS) IMPLANT
SEALANT SURG COSEAL 8ML (VASCULAR PRODUCTS) IMPLANT
SOLUTION ANTI FOG 6CC (MISCELLANEOUS) ×3 IMPLANT
SPECIMEN JAR MEDIUM (MISCELLANEOUS) ×3 IMPLANT
SPONGE GAUZE 4X4 12PLY STER LF (GAUZE/BANDAGES/DRESSINGS) ×3 IMPLANT
SPONGE INTESTINAL PEANUT (DISPOSABLE) ×15 IMPLANT
SPONGE TONSIL 1 RF SGL (DISPOSABLE) ×3 IMPLANT
STAPLE ECHEON FLEX 60 POW ENDO (STAPLE) ×3 IMPLANT
STAPLE RELOAD 2.5MM WHITE (STAPLE) ×15 IMPLANT
STAPLER ENDO GIA 12MM SHORT (STAPLE) IMPLANT
STAPLER RELOAD GOLD 60MM (STAPLE) ×30
STAPLER RELOAD GREEN 60MM (STAPLE) ×3
STAPLER VASCULAR ECHELON 35 (CUTTER) ×3 IMPLANT
SUT PROLENE 4 0 RB 1 (SUTURE)
SUT PROLENE 4-0 RB1 .5 CRCL 36 (SUTURE) IMPLANT
SUT SILK  1 MH (SUTURE) ×4
SUT SILK 1 MH (SUTURE) ×2 IMPLANT
SUT SILK 1 TIES 10X30 (SUTURE) ×3 IMPLANT
SUT SILK 2 0 SH (SUTURE) IMPLANT
SUT SILK 2 0SH CR/8 30 (SUTURE) IMPLANT
SUT SILK 3 0 SH 30 (SUTURE) IMPLANT
SUT SILK 3 0SH CR/8 30 (SUTURE) IMPLANT
SUT VIC AB 0 CTX 27 (SUTURE) IMPLANT
SUT VIC AB 1 CTX 27 (SUTURE) ×3 IMPLANT
SUT VIC AB 2-0 CT1 27 (SUTURE)
SUT VIC AB 2-0 CT1 TAPERPNT 27 (SUTURE) IMPLANT
SUT VIC AB 2-0 CTX 36 (SUTURE) ×3 IMPLANT
SUT VIC AB 3-0 MH 27 (SUTURE) IMPLANT
SUT VIC AB 3-0 SH 27 (SUTURE)
SUT VIC AB 3-0 SH 27X BRD (SUTURE) IMPLANT
SUT VIC AB 3-0 X1 27 (SUTURE) ×3 IMPLANT
SUT VICRYL 0 UR6 27IN ABS (SUTURE) ×6 IMPLANT
SUT VICRYL 2 TP 1 (SUTURE) IMPLANT
SWAB COLLECTION DEVICE MRSA (MISCELLANEOUS) IMPLANT
SYRINGE 10CC LL (SYRINGE) ×3 IMPLANT
SYSTEM SAHARA CHEST DRAIN ATS (WOUND CARE) ×3 IMPLANT
TAPE CLOTH SURG 4X10 WHT LF (GAUZE/BANDAGES/DRESSINGS) ×3 IMPLANT
TIP APPLICATOR SPRAY EXTEND 16 (VASCULAR PRODUCTS) IMPLANT
TOWEL OR 17X24 6PK STRL BLUE (TOWEL DISPOSABLE) ×3 IMPLANT
TOWEL OR 17X26 10 PK STRL BLUE (TOWEL DISPOSABLE) ×6 IMPLANT
TRAP SPECIMEN MUCOUS 40CC (MISCELLANEOUS) IMPLANT
TRAY FOLEY CATH 16FRSI W/METER (SET/KITS/TRAYS/PACK) IMPLANT
TRAY FOLEY W/METER SILVER 14FR (SET/KITS/TRAYS/PACK) ×3 IMPLANT
TROCAR XCEL BLADELESS 5X75MML (TROCAR) ×3 IMPLANT
TROCAR XCEL NON-BLD 5MMX100MML (ENDOMECHANICALS) IMPLANT
TUBE ANAEROBIC SPECIMEN COL (MISCELLANEOUS) IMPLANT
TUNNELER SHEATH ON-Q 11GX8 DSP (PAIN MANAGEMENT) ×3 IMPLANT
WATER STERILE IRR 1000ML POUR (IV SOLUTION) ×3 IMPLANT

## 2015-08-01 NOTE — Anesthesia Preprocedure Evaluation (Addendum)
Anesthesia Evaluation  Patient identified by MRN, date of birth, ID band Patient awake    Reviewed: Allergy & Precautions, H&P , NPO status , Patient's Chart, lab work & pertinent test results  History of Anesthesia Complications Negative for: history of anesthetic complications  Airway Mallampati: II  TM Distance: >3 FB Neck ROM: full    Dental no notable dental hx.    Pulmonary asthma ,    Pulmonary exam normal breath sounds clear to auscultation       Cardiovascular hypertension, Normal cardiovascular exam Rhythm:regular Rate:Normal     Neuro/Psych negative neurological ROS     GI/Hepatic Neg liver ROS, Small bowel obstruction   Endo/Other  negative endocrine ROS  Renal/GU Hx of right partial nephrectomy      Musculoskeletal   Abdominal   Peds  Hematology negative hematology ROS (+)   Anesthesia Other Findings   Reproductive/Obstetrics negative OB ROS                            Anesthesia Physical Anesthesia Plan  ASA: III  Anesthesia Plan: General   Post-op Pain Management:    Induction: Intravenous  Airway Management Planned: Double Lumen EBT  Additional Equipment: Arterial line and CVP  Intra-op Plan:   Post-operative Plan: Possible Post-op intubation/ventilation  Informed Consent: I have reviewed the patients History and Physical, chart, labs and discussed the procedure including the risks, benefits and alternatives for the proposed anesthesia with the patient or authorized representative who has indicated his/her understanding and acceptance.   Dental Advisory Given  Plan Discussed with: Anesthesiologist, CRNA and Surgeon  Anesthesia Plan Comments: (Rec size 35 double lumen ETT, have 37 available, will need A line and central line WIll use fentanyl intraop and monitor blood pressure Recommend ketamine prior to incision, '50mg'$ )       Anesthesia Quick  Evaluation

## 2015-08-01 NOTE — Anesthesia Procedure Notes (Addendum)
Procedure Name: Intubation Date/Time: 08/01/2015 8:51 AM Performed by: Trixie Deis A Pre-anesthesia Checklist: Patient identified, Timeout performed, Emergency Drugs available, Suction available and Patient being monitored Patient Re-evaluated:Patient Re-evaluated prior to inductionOxygen Delivery Method: Circle system utilized Preoxygenation: Pre-oxygenation with 100% oxygen Intubation Type: IV induction Ventilation: Mask ventilation without difficulty Laryngoscope Size: Mac and 3 Grade View: Grade II Endobronchial tube: Double lumen EBT, Left, EBT position confirmed by fiberoptic bronchoscope and EBT position confirmed by auscultation and 35 Fr Number of attempts: 2 (DL x 1 T Signa Kell, SRNA; DL x1 by CRNA) Airway Equipment and Method: Stylet Placement Confirmation: ETT inserted through vocal cords under direct vision,  positive ETCO2 and breath sounds checked- equal and bilateral Secured at: 30 cm Tube secured with: Tape Dental Injury: Teeth and Oropharynx as per pre-operative assessment    Central Venous Catheter Insertion Performed by: anesthesiologist Patient location: Pre-op. Preanesthetic checklist: patient identified, IV checked, site marked, risks and benefits discussed, surgical consent, monitors and equipment checked, pre-op evaluation, timeout performed and anesthesia consent Lidocaine 1% used for infiltration Landmarks identified Catheter size: 8 Fr Central line was placed.Double lumen Procedure performed using ultrasound guided technique. Attempts: 1 Following insertion, dressing applied, line sutured and Biopatch. Post procedure assessment: blood return through all ports. Patient tolerated the procedure well with no immediate complications.

## 2015-08-01 NOTE — Transfer of Care (Signed)
Immediate Anesthesia Transfer of Care Note  [name]  Procedure(s) Performed: Procedure(s): VIDEO ASSISTED THORACOSCOPY (VATS)/WEDGE RESECTION (Left) Left Upper Lobe SEGMENTECTOMY (Left) LYMPH NODE DISSECTION (Left)  Patient Location: PACU  Anesthesia Type:General  Level of Consciousness: sedated  Airway & Oxygen Therapy: Patient Spontanous Breathing and Patient connected to face mask oxygen  Post-op Assessment: Report given to RN, Post -op Vital signs reviewed and stable and Patient moving all extremities  Post vital signs: Reviewed and stable  Last Vitals:  Filed Vitals:   08/01/15 0720  BP: 171/79  Pulse: 58  Temp: 36.5 C  Resp: 20    Last Pain: There were no vitals filed for this visit.    Patients Stated Pain Goal: 3 (05/14/57 1368)  Complications: No apparent anesthesia complications

## 2015-08-01 NOTE — Interval H&P Note (Signed)
History and Physical Interval Note:  08/01/2015 8:12 AM  [name] Richards  has presented today for surgery, with the diagnosis of LUL NODULE  The various methods of treatment have been discussed with the patient and family. After consideration of risks, benefits and other options for treatment, the patient has consented to  Procedure(s): VIDEO ASSISTED THORACOSCOPY (VATS)/WEDGE RESECTION (Left) POSSIBLE SEGMENTECTOMY (Left) as a surgical intervention .  The patient's history has been reviewed, patient examined, no change in status, stable for surgery.  I have reviewed the patient's chart and labs.  Questions were answered to the patient's satisfaction.     Melrose Nakayama

## 2015-08-01 NOTE — Anesthesia Postprocedure Evaluation (Signed)
Anesthesia Post Note  [name]  Procedure(s) Performed: Procedure(s) (LRB): VIDEO ASSISTED THORACOSCOPY (VATS)/WEDGE RESECTION (Left) Left Upper Lobe SEGMENTECTOMY (Left) LYMPH NODE DISSECTION (Left)  Patient location during evaluation: PACU Anesthesia Type: General Level of consciousness: awake and alert Pain management: pain level controlled Vital Signs Assessment: post-procedure vital signs reviewed and stable Respiratory status: spontaneous breathing, nonlabored ventilation, respiratory function stable and patient connected to nasal cannula oxygen Cardiovascular status: blood pressure returned to baseline and stable Postop Assessment: no signs of nausea or vomiting Anesthetic complications: no Comments: Left hand has good cap refill and his equally warm as right hand now, denies pain in that hand    Last Vitals:  Filed Vitals:   08/01/15 1430 08/01/15 1500  BP: 141/66 155/85  Pulse:    Temp:    Resp: 16     Last Pain:  Filed Vitals:   08/01/15 1503  PainSc: 3                  Zenaida Deed

## 2015-08-01 NOTE — H&P (View-Only) (Signed)
PCP is Vidal Schwalbe, MD Referring Provider is Baltazar Apo, MD  Chief Complaint  Patient presents with  . Lung Lesion    LUL...Marland KitchenSurgical eval, Chest CT 07/12/15, PFT's 07/13/15.Marland KitchenMarland KitchenPET 01/30/15    HPI: [name] Richards is sent for consultation regarding a ground glass opacity in the left upper lobe.  [name] is a 70 year old woman with a history of endometrial and cervical cancer in 1997, recent vulvar cancer, hypertension, hyperlipidemia, hypothyroidism, air pollution-induced asthma, and a partial nephrectomy. She is a lifelong nonsmoker.  She was treated for right pleural effusion back in 2011. At that time she was noted to have 2 small right lung nodules. Those were followed but did not change over time. In December she had a PET/CT as part of her workup for vulvar cancer.  It showed a ground glass opacity in the left upper lobe that was mildly hypermetabolic. She recently had a follow-up CT which showed the ground glass opacity was stable in size but there was a possible increase in a solid component. He saw Dr. Lamonte Sakai who recommended surgery.  She is a lifelong nonsmoker. She does have a history of air pollution-induced asthma. This first became apparent to her when she was in [place]. She also had issues with it in [place]. Her appetite is good. Her weight is stable. She denies chest pain, pressure, or tightness. She denies shortness of breath. She's not had any recent wheezing. She has not had any unusual headaches or visual changes.  Zubrod Score: At the time of surgery this patient's most appropriate activity status/level should be described as: '[x]'$     0    Normal activity, no symptoms '[]'$     1    Restricted in physical strenuous activity but ambulatory, able to do out light work '[]'$     2    Ambulatory and capable of self care, unable to do work activities, up and about >50 % of waking hours                              '[]'$     3    Only limited self care, in bed greater than 50% of waking  hours '[]'$     4    Completely disabled, no self care, confined to bed or chair '[]'$     5    Moribund    Past Medical History  Diagnosis Date  . Asthma   . Hypertension   . Thyroid disease   . Cervical cancer ([facility]) 1997    poorly differentiated cervical carcinoma. S/p TAH and radiation x6 wks   . Endometrial cancer ([facility]) 1997    coincidental endometrial cancer also  . Hyperlipemia   . 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 Vertebral body hemangioma     T12  . Small bowel obstruction ([place])     12/2008, repeat SBO in 02/2011 also with terminal ilietis   . Complication of anesthesia     took 2 days to wake up after 1 surgery    Past Surgical History  Procedure Laterality Date  . Back surgery    . Tubal ligation    . Skin biopsy    . Abdominal hysterectomy  Feb 1997    TAH/BSO, pelvic and periaortic lymphadenectomy   . Partial nephrectomy      right    Family History  Problem Relation Age of Onset  . Hypertension Other   .  Diabetes Other   . Stroke Other   . CAD Other     Social History Social History  Substance Use Topics  . Smoking status: Never Smoker   . Smokeless tobacco: Never Used  . Alcohol Use: No    Current Outpatient Prescriptions  Medication Sig Dispense Refill  . acidophilus (RISAQUAD) CAPS Take 1 capsule by mouth daily.    Marland Kitchen albuterol (PROVENTIL HFA;VENTOLIN HFA) 108 (90 Base) MCG/ACT inhaler Inhale 2 puffs into the lungs every 6 (six) hours as needed for wheezing or shortness of breath. Reported on 06/29/2015    . benazepril-hydrochlorthiazide (LOTENSIN HCT) 20-12.5 MG per tablet Take 1 tablet by mouth daily.    . cholecalciferol (VITAMIN D) 1000 UNITS tablet Take 1,000 Units by mouth 2 (two) times daily.     Marland Kitchen dicyclomine (BENTYL) 20 MG tablet Take 1 tablet (20 mg total) by mouth every 6 (six) hours as needed for spasms (for abdominal cramping). 20 tablet 0  . docusate sodium (COLACE) 100 MG capsule Take 100 mg by mouth 2 (two)  times daily.    Marland Kitchen levothyroxine (SYNTHROID, LEVOTHROID) 75 MCG tablet Take 75 mcg by mouth daily.    . magnesium gluconate (MAGONATE) 500 MG tablet Take 500 mg by mouth every morning.     . Multiple Vitamins-Minerals (MULTIVITAMIN WITH MINERALS) tablet Take 1 tablet by mouth daily.    . OIL OF OREGANO PO Take 1 capsule by mouth daily.    Marland Kitchen omega-3 acid ethyl esters (LOVAZA) 1 G capsule Take 1 g by mouth 2 (two) times daily.    . ondansetron (ZOFRAN ODT) 8 MG disintegrating tablet Take 1 tablet (8 mg total) by mouth every 8 (eight) hours as needed for nausea or vomiting. 10 tablet 0  . simvastatin (ZOCOR) 20 MG tablet Take 20 mg by mouth every evening.    . valACYclovir (VALTREX) 1000 MG tablet Take 1,000 mg by mouth 2 (two) times daily as needed (for 2 days for cold sores). Reported on 06/29/2015    . vitamin C (ASCORBIC ACID) 500 MG tablet Take 500 mg by mouth daily.     No current facility-administered medications for this visit.    Allergies  Allergen Reactions  . Codeine     Reporting amnesia  . Prochlorperazine Edisylate     Jaws locked  . Fentanyl     hypotension  . Penicillins Hives    Has patient had a PCN reaction causing immediate rash, facial/tongue/throat swelling, SOB or lightheadedness with hypotension:noHas patient had a PCN reaction causing severe rash involving mucus membranes or skin necrosis: yes Has patient had a PCN reaction that required hospitalization:yes, Urgent Care visit Has patient had a PCN reaction occurring within the last 10 years: no If all of the above answers are "NO", then may proceed with Cephalosporin use.   Marland Kitchen Percocet [Oxycodone-Acetaminophen] Other (See Comments)    Made pt feel real funny, never wanting to take it again    Review of Systems  Constitutional: Negative for fever, chills, activity change, appetite change and unexpected weight change.  HENT: Negative for congestion, trouble swallowing and voice change.   Eyes: Negative for  photophobia and visual disturbance.  Respiratory: Positive for wheezing (with air pollutants). Negative for cough, chest tightness and shortness of breath.   Cardiovascular: Negative for chest pain, palpitations and leg swelling.  Gastrointestinal: Negative for abdominal pain, blood in stool and abdominal distention.  Genitourinary: Positive for difficulty urinating. Negative for dysuria and hematuria.  Needs pediatric catheter  Musculoskeletal: Negative for myalgias, joint swelling and arthralgias.  Skin: Negative for rash.  Neurological: Negative for syncope, speech difficulty, weakness and headaches.  Hematological: Negative for adenopathy. Does not bruise/bleed easily.  All other systems reviewed and are negative.   BP 159/86 mmHg  Pulse 62  Resp 16  Ht 5' 5.25" (1.657 m)  Wt 151 lb (68.493 kg)  BMI 24.95 kg/m2  SpO2 98% Physical Exam  Constitutional: She is oriented to person, place, and time. She appears well-developed and well-nourished. No distress.  HENT:  Head: Normocephalic and atraumatic.  Mouth/Throat: No oropharyngeal exudate.  Eyes: Conjunctivae and EOM are normal. No scleral icterus.  Neck: Normal range of motion. Neck supple. No tracheal deviation present. No thyromegaly present.  Cardiovascular: Normal rate, regular rhythm, normal heart sounds and intact distal pulses.  Exam reveals no gallop and no friction rub.   No murmur heard. Pulmonary/Chest: Effort normal and breath sounds normal. No respiratory distress. She has no wheezes. She has no rales.  Abdominal: Soft. She exhibits no distension. There is no tenderness.  Musculoskeletal: Normal range of motion. She exhibits no edema or tenderness.  Lymphadenopathy:    She has no cervical adenopathy.  Neurological: She is alert and oriented to person, place, and time. No cranial nerve deficit.  Normal motor function  Skin: Skin is warm and dry.  Vitals reviewed.    Diagnostic Tests: CT CHEST WITHOUT  CONTRAST  TECHNIQUE: Multidetector CT imaging of the chest was performed following the standard protocol without IV contrast.  COMPARISON: 03/31/2015  FINDINGS: Mediastinum/Lymph Nodes: Normal heart size. No pericardial effusion identified. Aortic atherosclerosis noted. Calcification in the LAD and RCA coronary arteries noted. There is no mediastinal or hilar adenopathy identified. No axillary or supraclavicular adenopathy.  Lungs/Pleura: No pleural effusion identified. In the left upper lobe there is a ground-glass attenuating nodule which measures 2.2 x 1.7 cm, image 18 of series 3. This has increased in size when compared with study from 02/19/2011. When compared with the most recent study from 01/30/2015. It this is unchanged in overall size measuring 2.2 x 1.7 cm. Increasing central solid component now measures 9 x 3 mm, image 73 of series 5. Solid right lower lobe lung nodule measures 4 mm, image 93 of series 3. Unchanged from previous exam.  Upper abdomen: The adrenal glands are normal. Scar is identified involving the upper pole of the right kidney. The left kidney is normal.  Musculoskeletal: Spondylosis noted within the thoracic spine.  IMPRESSION: 1. The solid component of the large part solid nodule in the left upper lobe measures 9 x 3 mm (mean diameter 6 mm.) persistent part solid nodules with solid components greater than or equal to 6 mm should be considered highly suspicious for pulmonary adenocarcinoma. 2. Stable solid nodule within the right lower lobe. 3. Aortic atherosclerosis and multi vessel coronary artery calcification.   Electronically Signed  By: Kerby Moors M.D.  On: 07/12/2015 17:01  NUCLEAR MEDICINE PET SKULL BASE TO THIGH  TECHNIQUE: 7.18 mCi F-18 FDG was injected intravenously. Full-ring PET imaging was performed from the skull base to thigh after the radiotracer. CT data was obtained and used for attenuation correction  and anatomic localization.  FASTING BLOOD GLUCOSE: Value: 101 mg/dl  COMPARISON: 02/19/2011  FINDINGS: NECK  No hypermetabolic lymph nodes in the neck.  CHEST  There is a ground-glass attenuating nodule within the left upper lobe which measures 1.9 cm. This has increased from previous exam where it measured  11 mm, image 12 of series 8. The SUV max associated this nodule is equal to 1.6 cm. This has a central solid component which measures 6 mm, image 45 of series 4. No additional suspicious nodules or mass identified.  ABDOMEN/PELVIS  No abnormal hypermetabolic activity within the liver, pancreas, adrenal glands, or spleen. No hypermetabolic lymph nodes in the abdomen or pelvis. Previous retroperitoneal and pelvic lymph node dissection. There is a large focus of increased radiotracer uptake which measures approximately 2.7 cm and has an SUV max equal to 24. It is unclear whether not this represents urine contamination or the patient's primary site of tumor.  SKELETON  No focal hypermetabolic activity to suggest skeletal metastasis.  IMPRESSION: 1. There are no findings identified to suggest metastatic disease from recently diagnosed vulvar carcinoma. 2. In the region of the posterior vulva and urethral meatus there is an approximate 2.7 cm hypermetabolic focus. It is unclear whether not this represents patient's primary site of disease or urine contamination. Correlation with clinical exam findings recommended. 3. Enlarging sub solid nodule within the left upper lobe exhibits mild, borderline malignant range FDG uptake. This is suspicious for low grade, indolent pulmonary neoplasm such as adenocarcinoma. Thoracic surgery consultation recommended.   Electronically Signed  By: Kerby Moors M.D.  On: 01/30/2015 10:56  PULMONARY FUNCTION TEST FVC= 3.31 (106%) FEV1= 2.36 (99%)/ 2.70 (114%) post bronchodilator DLCO= 24.44 (94%)  I personally  reviewed the CT and PET CT as noted above. I concur with the findings as noted.  Impression: 70 yo nonsmoker with a mixed subsolid/ solid nodule in the left upper lobe. The overall size is about 22 mm with the invasive component measuring 3 x 9 mm per Radiology, although it is difficult to tell exactly due to a blood vessel in that area.  I had a long discussion with Ms. Cogliano and her ex-husband. We reviewed the CTs and PET/ CT. They understand this nodule is suspicious for, but not definitely, a low grade adenocarcinoma. We discussed various approaches to diagnosis and treatment. They understand that surgical resection and stereotactic radiation are both acceptable, but not necessarily equivalent treatments, if this in fact an adenocarcinoma.  I recommended proceeding with left VATS, wedge resection and possible segmentectomy (lingular sparing lobectomy). We discussed the general nature of the procedure, the need for general anesthesia, the incisions to be used, the intraoperative decision making and the need for drainage tubes postoperatively. I informed them of the expected hospital stay, overall recovery and short and long term outcomes. I reviewed the indications, risks, benefits and alternatives. They understand the risks include, but are not limited to death, stroke, MI, DVT/PE, bleeding, possible need for transfusion, infections, prolonged air leak, cardiac arrhythmias, as well as the possibility of other unforeseeable complications.   She accepts the risks and agrees to proceed.  Plan LEFT VATS, wedge resection, possible segmentectomy on Wed. 08/01/2015   Melrose Nakayama, MD [facility] [phone]

## 2015-08-01 NOTE — Op Note (Signed)
[name], [name] NO.:  [id number]  MEDICAL RECORD NO.:  [id number]  LOCATION:  3S01C                        FACILITY:  [facility]  PHYSICIAN:  Revonda Standard. Roxan Hockey, M.D.DATE OF BIRTH:  August 21, 1945  DATE OF PROCEDURE:  08/01/2015 DATE OF DISCHARGE:                              OPERATIVE REPORT   PREOPERATIVE DIAGNOSIS:  Ground-glass opacity, left upper lobe.  POSTOPERATIVE DIAGNOSIS:  Adenocarcinoma, left upper lobe.  PROCEDURE:  Left video-assisted thoracoscopy,   Wedge resection,   Thoracoscopic lingular sparing left upper lobectomy   Lymph node dissection   On-Q local anesthetic catheter placement.  SURGEON:  Revonda Standard. Roxan Hockey, M.D.  ASSISTANT:  [name] Richards, P.A.-C.  ANESTHESIA:  General.  FINDINGS:  Amorphous mass in apex of left upper lobe.  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  Due to radiographic suggestion of an invasive component, segmentectomy completed.  Margins negative for tumor.  CLINICAL NOTE:  [name] Richards is a 70 year old lifelong nonsmoker, who was diagnosed with vulvar cancer back in December.  A PET-CT at that time showed a ground-glass opacity in the left upper lobe.  It was mildly hypermetabolic.  A followup CT showed the opacity was stable in size, but there was an increase of a solid component.  She was referred to Dr. Lamonte Sakai, he recommended surgical resection.  I agreed with that assessment and recommended left VATS for wedge resection and possible sublobar resection depending on intraoperative findings.  The indications, risks, benefits, alternatives, and intraoperative decision making were discussed in detail with the patient.  She understood and accepted the risks and agreed to proceed.  OPERATIVE NOTE:  [name] Richards was brought to the preoperative holding area on August 01, 2015.  Anesthesia placed a central line and arterial blood pressure monitoring line.  She was taken to the operating  room, anesthetized, and intubated with a double-lumen endotracheal tube.  A Foley catheter was placed.  Sequential compressive devices were placed on the calves for DVT prophylaxis.  She was placed in a right lateral decubitus position and the left chest was prepped and draped in usual sterile fashion.  Single lung ventilation of the right lung was initiated and was tolerated well throughout the procedure.  An incision was made in the seventh intercostal space in the midaxillary line, it was carried through the skin and subcutaneous tissue.  A port was inserted into the left pleural space.  The thoracoscope was advanced into the chest.  There was good isolation of the left lung.  There was no pleural effusion and no abnormality of the visceral or parietal pleura was noted.  A working incision was made in the fourth interspace anterolaterally.  No rib spreading was performed during the procedure. A second port incision was made anterior to the first for instrumentation. The left upper lobe was inspected, it was clear from the CT that the ground-glass opacity was in the apex of the lung, this was not clearly palpable, but there was suggestion of a nodule.  A wedge resection was performed with sequential firings of an Echelon 60 mm stapler using gold cartridges.  The specimen was placed into an endoscopic retrieval bag, removed, and sent  for frozen section.  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  There was no clear invasive component in the portion that was sampled, but given the radiographic findings, it was felt that a segmental resection would be appropriate.  The stapled margin was free of tumor.  The pleural reflection was then divided at the hilum.  The inferior ligament was divided.  The level 9 lymph node was removed.  All lymph nodes that were encountered during the dissection were sent for permanent pathology as separate specimens.  The superior pulmonary  vein was identified.  The lingular branches were identified and preserved.  The remaining segmental branches were dissected out, encircled, and divided with an endoscopic vascular stapler.  The pleura overlying the aortopulmonary window was incised, and level 5 lymph nodes were removed, these were sent for pathology.  A rather large node was present between the pulmonary artery and a left upper lobe bronchus, this was dissected out. This was a slow and tedious due to its location, but once it was dissected out, the anterior segmental artery branch was identified, encircled, and divided with the endoscopic vascular stapler. Attention then was turned to the fissure, this was nearly complete except for a small component in the midportion of the fissure.  The pulmonary artery was identified anteriorly and posteriorly, and the remainder the fissure was completed with an endoscopic stapler.  The posterior segmental artery branch was dissected out.  There was some bleeding which was controlled with direct pressure and resolved after a few minutes.  The posterior branch then was divided with the stapler.  Finally, the apical branch was divided as well.  The left upper lobe bronchus was identified.  The bifurcation of the lingular segments from the remaining segmental branches was identified.  The common segmental trunk to the anterior apical and posterior segments was dissected out and encircled, and a stapler was placed across the bronchus and closed.  A test inflation showed good aeration of the lower lobe and the lingular segments.  The stapler was fired transecting the bronchus.  Segmentectomy was completed with sequential firings of the endoscopic stapler again using yellow cartridges.  The segment was sent for permanent pathology.  The chest was copiously irrigated with warm saline.  A test inflation to 30 cm water revealed no significant air leakage.  An On-Q local anesthetic catheter was  placed through a separate stab incision posteriorly and tunneled into a subpleural location.  It was primed with 5 mL 0.5% Marcaine and secured at the skin with 2-0 silk suture.  A 28-French Blake drain and a 28-French chest tube were placed through the 2 port incisions and secured to skin with #1 silk sutures.  The left lower lobe and lingula were reinflated.  The working incision was closed in standard fashion in 3 layers with a running #1 Vicryl fascial suture, 2- 0 Vicryl subcutaneous suture, and a 3-0 Vicryl subcuticular suture.  The chest tubes were placed to suction.  The patient was placed back in a supine position and was then extubated in the operating room and taken to the postanesthetic care unit in good condition.     Revonda Standard Roxan Hockey, M.D.     SCH/MEDQ  D:  08/01/2015  T:  08/01/2015  Job:  [id number]

## 2015-08-01 NOTE — Brief Op Note (Addendum)
08/01/2015  12:23 PM  PATIENT:  [name] Richards  70 y.o. female       [address]       [place] [address]             [phone]     08/01/2015  12:24 PM  PATIENT:  [name] Richards  70 y.o. female  PRE-OPERATIVE DIAGNOSIS:  Left Upper Lobe Nodule  POST-OPERATIVE DIAGNOSIS:  Adenocarcinoma Left Upper Lobe  PROCEDURE:  Procedure(s): VIDEO ASSISTED THORACOSCOPY (VATS) WEDGE RESECTION LEFT UPPER LOBE THORACOSCOPIC LINGULAR SPARING LEFT UPPER LOBECTOMY LYMPH NODE DISSECTION  SURGEON:  Surgeon(s): Melrose Nakayama, MD  PHYSICIAN ASSISTANT: WAYNE GOLD PA-C  ANESTHESIA:   general  FROZEN: ADENOCARCINOMA IN SITU, MARGIN- NEGATIVE  DISPOSITION OF SPECIMEN:  Pathology   Source of Specimen:  LULSEGMENTECTOMY, LN SAMPLES   PATIENT CONDITION:  PACU - hemodynamically stable.  PRE-OPERATIVE WEIGHT: 40ZJ  COMPLICATIONS: NO KNOWN  EBL: 200

## 2015-08-02 ENCOUNTER — Encounter (HOSPITAL_COMMUNITY): Payer: Self-pay | Admitting: Thoracic Surgery (Cardiothoracic Vascular Surgery)

## 2015-08-02 ENCOUNTER — Inpatient Hospital Stay (HOSPITAL_COMMUNITY): Payer: Medicare Other

## 2015-08-02 LAB — BLOOD GAS, ARTERIAL
Acid-Base Excess: 0.5 mmol/L (ref 0.0–2.0)
Acid-base deficit: 0.9 mmol/L (ref 0.0–2.0)
Bicarbonate: 24.7 mEq/L — ABNORMAL HIGH (ref 20.0–24.0)
Bicarbonate: 23.6 mEq/L (ref 20.0–24.0)
Drawn by: 31101
FIO2: 0.21
FIO2: 0.28
O2 Saturation: 96.1 %
O2 Saturation: 98.4 %
Patient temperature: 98.6
Patient temperature: 98.6
TCO2: 25.9 mmol/L (ref 0–100)
TCO2: 24.9 mmol/L (ref 0–100)
pCO2 arterial: 40.4 mmHg (ref 35.0–45.0)
pCO2 arterial: 41.4 mmHg (ref 35.0–45.0)
pH, Arterial: 7.403 (ref 7.350–7.450)
pH, Arterial: 7.374 (ref 7.350–7.450)
pO2, Arterial: 86.7 mmHg (ref 80.0–100.0)
pO2, Arterial: 122 mmHg — ABNORMAL HIGH (ref 80.0–100.0)

## 2015-08-02 LAB — BASIC METABOLIC PANEL
Anion gap: 7 (ref 5–15)
BUN: 9 mg/dL (ref 6–20)
CO2: 25 mmol/L (ref 22–32)
Calcium: 8.7 mg/dL — ABNORMAL LOW (ref 8.9–10.3)
Chloride: 101 mmol/L (ref 101–111)
Creatinine, Ser: 0.59 mg/dL (ref 0.44–1.00)
GFR calc Af Amer: >60 mL/min (ref >60)
GFR calc non Af Amer: >60 mL/min (ref >60)
Glucose, Bld: 139 mg/dL — ABNORMAL HIGH (ref 65–99)
Potassium: 3.8 mmol/L (ref 3.5–5.1)
Sodium: 133 mmol/L — ABNORMAL LOW (ref 135–145)

## 2015-08-02 LAB — CBC
HCT: 32 % — ABNORMAL LOW (ref 36.0–46.0)
Hemoglobin: 10 g/dL — ABNORMAL LOW (ref 12.0–15.0)
MCH: 20.9 pg — ABNORMAL LOW (ref 26.0–34.0)
MCHC: 31.3 g/dL (ref 30.0–36.0)
MCV: 66.8 fL — ABNORMAL LOW (ref 78.0–100.0)
Platelets: 167 10*3/uL (ref 150–400)
RBC: 4.79 MIL/uL (ref 3.87–5.11)
RDW: 14.4 % (ref 11.5–15.5)
WBC: 7.7 10*3/uL (ref 4.0–10.5)

## 2015-08-02 MED ORDER — ENOXAPARIN SODIUM 40 MG/0.4ML ~~LOC~~ SOLN
40.0000 mg | SUBCUTANEOUS | Status: DC
  Administered 2015-08-02 – 2015-08-06 (×5): 40 mg via SUBCUTANEOUS
  Filled 2015-08-02 (×5): qty 0.4

## 2015-08-02 MED ORDER — HYDROCHLOROTHIAZIDE 12.5 MG PO CAPS
12.5000 mg | ORAL_CAPSULE | Freq: Every day | ORAL | Status: DC
  Administered 2015-08-02 – 2015-08-06 (×5): 12.5 mg via ORAL
  Filled 2015-08-02 (×5): qty 1

## 2015-08-02 MED ORDER — DICYCLOMINE HCL 20 MG PO TABS: 20.0000 mg | ORAL_TABLET | Freq: Four times a day (QID) | ORAL | Status: DC | PRN

## 2015-08-02 MED ORDER — BENAZEPRIL HCL 10 MG PO TABS
20.0000 mg | ORAL_TABLET | Freq: Every day | ORAL | Status: DC
  Administered 2015-08-02 – 2015-08-06 (×5): 20 mg via ORAL
  Filled 2015-08-02 (×5): qty 2

## 2015-08-02 MED ORDER — POLYETHYLENE GLYCOL 3350 17 G PO PACK
17.0000 g | PACK | Freq: Every day | ORAL | Status: DC
  Administered 2015-08-02 – 2015-08-03 (×2): 17 g via ORAL
  Filled 2015-08-02 (×2): qty 1

## 2015-08-02 MED ORDER — CETYLPYRIDINIUM CHLORIDE 0.05 % MT LIQD
7.0000 mL | Freq: Two times a day (BID) | OROMUCOSAL | Status: DC
  Administered 2015-08-02 – 2015-08-06 (×4): 7 mL via OROMUCOSAL

## 2015-08-02 MED ORDER — ZOLPIDEM TARTRATE 5 MG PO TABS
5.0000 mg | ORAL_TABLET | Freq: Every evening | ORAL | Status: DC | PRN
  Administered 2015-08-02 – 2015-08-03 (×2): 5 mg via ORAL
  Filled 2015-08-02 (×3): qty 1

## 2015-08-02 MED ORDER — BENAZEPRIL-HYDROCHLOROTHIAZIDE 20-12.5 MG PO TABS: 1.0000 | ORAL_TABLET | Freq: Every day | ORAL | Status: DC

## 2015-08-02 NOTE — Progress Notes (Addendum)
1 Day Post-Op Procedure(s) (LRB): VIDEO ASSISTED THORACOSCOPY (VATS)/WEDGE RESECTION (Left) Left Upper Lobe SEGMENTECTOMY (Left) LYMPH NODE DISSECTION (Left) Subjective: C/o left shoulder discomfort  Objective: Vital signs in last 24 hours: Temp:  [97.2 F (36.2 C)-98.5 F (36.9 C)] 98.5 F (36.9 C) (06/22 0731) Pulse Rate:  [59-74] 64 (06/22 0731) Cardiac Rhythm:  [-] Normal sinus rhythm (06/22 0731) Resp:  [9-18] 16 (06/22 0731) BP: (141-168)/(66-97) 147/73 mmHg (06/22 0731) SpO2:  [96 %-100 %] 99 % (06/22 0731) Arterial Line BP: (165-201)/(68-88) 200/84 mmHg (06/22 0731) Weight:  [151 lb 14.4 oz (68.9 kg)] 151 lb 14.4 oz (68.9 kg) (06/21 1653)  Hemodynamic parameters for last 24 hours:    Intake/Output from previous day: 06/21 0701 - 06/22 0700 In: 3447.9 [P.O.:600; I.V.:2647.9; IV Piggyback:200] Out: 1424 [Urine:1070; Blood:100; Chest Tube:254] Intake/Output this shift: Total I/O In: 193.8 [I.V.:193.8] Out: 320 [Urine:200; Chest Tube:120]  General appearance: alert, cooperative and no distress Heart: regular rate and rhythm Lungs: clear to auscultation bilaterally Abdomen: benign Extremities: no edema Wound: incis healing well  Lab Results:  Recent Labs  07/30/15 1029 08/02/15 0550  WBC 4.1 7.7  HGB 11.6* 10.0*  HCT 37.4 32.0*  PLT 174 167   BMET:   Recent Labs  07/30/15 1029 08/02/15 0550  NA 135 133*  K 3.9 3.8  CL 98* 101  CO2 29 25  GLUCOSE 90 139*  BUN 13 9  CREATININE 0.73 0.59  CALCIUM 9.6 8.7*    PT/INR:   Recent Labs  07/30/15 1029  LABPROT 13.8  INR 1.04   ABG    Component Value Date/Time   PHART 7.374 08/02/2015 0420   HCO3 23.6 08/02/2015 0420   TCO2 24.9 08/02/2015 0420   ACIDBASEDEF 0.9 08/02/2015 0420   O2SAT 98.4 08/02/2015 0420   CBG (last 3)  No results for input(s): GLUCAP in the last 72 hours.  Meds Scheduled Meds: . acetaminophen  1,000 mg Oral Q6H   Or  . acetaminophen (TYLENOL) oral liquid 160 mg/5 mL   1,000 mg Oral Q6H  . acidophilus  1 capsule Oral Daily  . antiseptic oral rinse  7 mL Mouth Rinse BID  . benazepril  20 mg Oral Daily  . bisacodyl  10 mg Oral Daily  . docusate sodium  100 mg Oral BID  . hydrochlorothiazide  12.5 mg Oral Daily  . HYDROmorphone   Intravenous Q4H  . levothyroxine  75 mcg Oral QAC breakfast  . lung surgery book   Does not apply Once  . omega-3 acid ethyl esters  1 g Oral BID  . pantoprazole  40 mg Oral Daily  . polyethylene glycol  17 g Oral Q2000  . senna-docusate  1 tablet Oral QHS  . simvastatin  20 mg Oral QPM   Continuous Infusions: . dextrose 5 % and 0.9% NaCl 125 mL/hr at 08/02/15 0838   PRN Meds:.albuterol, diphenhydrAMINE **OR** diphenhydrAMINE, naloxone **AND** sodium chloride flush, ondansetron (ZOFRAN) IV, potassium chloride, traMADol  Xrays Dg Chest Port 1 View  08/02/2015  CLINICAL DATA:  Left upper lobectomy sparing the lingula for resection of adenocarcinoma within the left upper lobe, followup EXAM: PORTABLE CHEST 1 VIEW COMPARISON:  Portable chest x-ray of 08/01/2015, and CT chest of 07/11/2005 FINDINGS: There is and increasing opacity overlying the aortic knob within the medial left upper hemi thorax. This most likely is postoperative in nature and could represent a focus of hemorrhage. A vascular abnormality cannot be excluded and if suspicious CT the chest would be  recommended with IV contrast media. A left chest tubes are noted and no pneumothorax is seen. The right lung is clear. Left IJ central venous line overlies the mid upper SVC. IMPRESSION: 1. Increasing opacity in the medial left upper hemithorax overlying the aortic knob possibly postoperative but a vascular injury cannot be excluded. Consider CT the chest with IV contrast if warranted. 2. Left chest tubes remain with no pneumothorax. Electronically Signed   By: Ivar Drape M.D.   On: 08/02/2015 08:03   Dg Chest Port 1 View  08/01/2015  CLINICAL DATA:  Status post left the ATV S  today for wedge resection of a left upper lobe nodule. EXAM: PORTABLE CHEST 1 VIEW COMPARISON:  PA and lateral chest 07/30/2015 low. CT chest 07/12/2015. FINDINGS: Left chest tube is in place. The patient has a left IJ approach central venous catheter with the tip in the mid superior vena cava. No pneumothorax identified. New surgical clips are seen in the left hilum. The right lung is expanded and clear. No pleural effusion. IMPRESSION: Status post resection of a left upper lobe nodule today. Negative for pneumothorax or other acute abnormality with a chest tube in place. Left IJ catheter tip projects in the mid superior vena cava. Electronically Signed   By: Inge Rise M.D.   On: 08/01/2015 14:26    Assessment/Plan: S/P Procedure(s) (LRB): VIDEO ASSISTED THORACOSCOPY (VATS)/WEDGE RESECTION (Left) Left Upper Lobe SEGMENTECTOMY (Left) LYMPH NODE DISSECTION (Left)  1 resume Lotensin HCT for htn - creat is normal 2 d/c aline/foley 3 CXR findings noted, doubt this correlates with active bleeding , expected ABL anemia is mild - observe clinically 3 no  Air leak- CT to H2O seal 4 decrease IVF rate 5 cont PCA for now 6 routine pulm toilet/rehab  LOS: 1 day    GOLD,WAYNE E 08/02/2015  Patient seen and examined, agree with above There is no reason to suspect an aortic injury. There may be a fluid collection or hematoma in the AP window area, either lymphatics or bronchial artery. She has no signs of active bleeding  Remo Lipps C. Roxan Hockey, MD [facility] [phone]

## 2015-08-03 ENCOUNTER — Inpatient Hospital Stay (HOSPITAL_COMMUNITY): Payer: Medicare Other

## 2015-08-03 LAB — COMPREHENSIVE METABOLIC PANEL
ALT: 21 U/L (ref 14–54)
AST: 23 U/L (ref 15–41)
Albumin: 3.2 g/dL — ABNORMAL LOW (ref 3.5–5.0)
Alkaline Phosphatase: 64 U/L (ref 38–126)
Anion gap: 7 (ref 5–15)
BUN: 8 mg/dL (ref 6–20)
CO2: 28 mmol/L (ref 22–32)
Calcium: 8.6 mg/dL — ABNORMAL LOW (ref 8.9–10.3)
Chloride: 99 mmol/L — ABNORMAL LOW (ref 101–111)
Creatinine, Ser: 0.64 mg/dL (ref 0.44–1.00)
GFR calc Af Amer: >60 mL/min (ref >60)
GFR calc non Af Amer: >60 mL/min (ref >60)
Glucose, Bld: 124 mg/dL — ABNORMAL HIGH (ref 65–99)
Potassium: 3.3 mmol/L — ABNORMAL LOW (ref 3.5–5.1)
Sodium: 134 mmol/L — ABNORMAL LOW (ref 135–145)
Total Bilirubin: 0.7 mg/dL (ref 0.3–1.2)
Total Protein: 5.8 g/dL — ABNORMAL LOW (ref 6.5–8.1)

## 2015-08-03 LAB — CBC
HCT: 33.1 % — ABNORMAL LOW (ref 36.0–46.0)
Hemoglobin: 10.3 g/dL — ABNORMAL LOW (ref 12.0–15.0)
MCH: 20.8 pg — ABNORMAL LOW (ref 26.0–34.0)
MCHC: 31.1 g/dL (ref 30.0–36.0)
MCV: 66.7 fL — ABNORMAL LOW (ref 78.0–100.0)
Platelets: 172 10*3/uL (ref 150–400)
RBC: 4.96 MIL/uL (ref 3.87–5.11)
RDW: 14.4 % (ref 11.5–15.5)
WBC: 7.4 10*3/uL (ref 4.0–10.5)

## 2015-08-03 MED ORDER — HYDROCORTISONE 0.5 % EX OINT
TOPICAL_OINTMENT | Freq: Every day | CUTANEOUS | Status: DC | PRN
  Filled 2015-08-03: qty 28.35

## 2015-08-03 MED ORDER — MAGNESIUM GLUCONATE 500 MG PO TABS
500.0000 mg | ORAL_TABLET | Freq: Every morning | ORAL | Status: DC
  Administered 2015-08-03 – 2015-08-06 (×4): 500 mg via ORAL
  Filled 2015-08-03 (×5): qty 1

## 2015-08-03 MED ORDER — POTASSIUM CHLORIDE 10 MEQ/50ML IV SOLN
10.0000 meq | INTRAVENOUS | Status: AC
  Administered 2015-08-03 (×4): 10 meq via INTRAVENOUS
  Filled 2015-08-03 (×4): qty 50

## 2015-08-03 NOTE — Discharge Instructions (Signed)
Lung Resection, Care After Refer to this sheet in the next few weeks. These instructions provide you with information on caring for yourself after your procedure. Your health care provider may also give you more specific instructions. Your treatment has been planned according to current medical practices, but problems sometimes occur. Call your health care provider if you have any problems or questions after your procedure. WHAT TO EXPECT AFTER THE PROCEDURE After your procedure, it is typical to have the following:   You may feel pain in your chest and throat.  Patients may sometimes shiver or feel nauseous during recovery. HOME CARE INSTRUCTIONS  You may resume a normal diet and activities as directed by your health care provider.  Do not use any tobacco products, including cigarettes, chewing tobacco, or electronic cigarettes. If you need help quitting, ask your health care provider.  There are many different ways to close and cover an incision, including stitches, skin glue, and adhesive strips. Follow your health care provider's instructions on:  Incision care.  Bandage (dressing) changes and removal.  Incision closure removal.  Take medicines only as directed by your health care provider.  Keep all follow-up visits as directed by your health care provider. This is important.  Try to breathe deeply and cough as directed. Holding a pillow firmly over your ribs may help with discomfort.  If you were given an incentive spirometer in the hospital, continue to use it as directed by your health care provider.  Walk as directed by your health care provider.  You may take a shower and gently wash the area of your incision with water and soap as directed by your health care provider. Do not use anything else to clean your incision except as directed by your health care provider. Do not take baths, swim, or use a hot tub until your health care provider approves. SEEK MEDICAL CARE  IF:  You notice redness, swelling, or increasing pain at the incision site.  You are bleeding at the incision site.  You see pus coming from the incision site.  You notice a bad smell coming from the incision site or bandage.  Your incision breaks open.  You cough up blood or pus, or you develop a cough that produces bad-smelling sputum.  You have pain or swelling in your legs.  You have increasing pain that is not controlled with medicine.  You have trouble managing any of the tubes that have been left in place after surgery.  You have fever or chills. SEEK IMMEDIATE MEDICAL CARE IF:   You have chest pain or an irregular or rapid heartbeat.  You have dizzy episodes or faint.  You have shortness of breath or difficulty breathing.  You have persistent nausea or vomiting.  You have a rash.   This information is not intended to replace advice given to you by your health care provider. Make sure you discuss any questions you have with your health care provider.   Document Released: 08/16/2004 Document Revised: 02/17/2014 Document Reviewed: 03/18/2013 Elsevier Interactive Patient Education [employer].

## 2015-08-03 NOTE — Care Management Important Message (Signed)
Important Message  Patient Details  Name: [name] Richards MRN: [id number] Date of Birth: 02-19-1945   Medicare Important Message Given:  Yes    Nathen May 08/03/2015, 10:35 AM

## 2015-08-03 NOTE — Progress Notes (Signed)
2 Days Post-Op Procedure(s) (LRB): VIDEO ASSISTED THORACOSCOPY (VATS)/WEDGE RESECTION (Left) Left Upper Lobe SEGMENTECTOMY (Left) LYMPH NODE DISSECTION (Left) Subjective: Doesn't feel as well as she did yesterday, "I overdid it" C/o back and shoulder pain, minimal use of PCA  Objective: Vital signs in last 24 hours: Temp:  [97.1 F (36.2 C)-99 F (37.2 C)] 98.4 F (36.9 C) (06/23 0806) Pulse Rate:  [58-66] 66 (06/23 0806) Cardiac Rhythm:  [-] Normal sinus rhythm (06/22 2000) Resp:  [12-22] 22 (06/23 0806) BP: (140-177)/(63-87) 156/84 mmHg (06/23 0806) SpO2:  [96 %-99 %] 99 % (06/23 0806)  Hemodynamic parameters for last 24 hours:    Intake/Output from previous day: 06/22 0701 - 06/23 0700 In: 2340 [P.O.:840; I.V.:1500] Out: 2831 [Urine:2600; Chest Tube:490] Intake/Output this shift:    General appearance: alert, cooperative and no distress Neurologic: intact Heart: regular rate and rhythm Lungs: diminished breath sounds bibasilar no air leak, serosanguinous drainage  Lab Results:  Recent Labs  08/02/15 0550 08/03/15 0640  WBC 7.7 7.4  HGB 10.0* 10.3*  HCT 32.0* 33.1*  PLT 167 172   BMET:  Recent Labs  08/02/15 0550 08/03/15 0640  NA 133* 134*  K 3.8 3.3*  CL 101 99*  CO2 25 28  GLUCOSE 139* 124*  BUN 9 8  CREATININE 0.59 0.64  CALCIUM 8.7* 8.6*    PT/INR: No results for input(s): LABPROT, INR in the last 72 hours. ABG    Component Value Date/Time   PHART 7.374 08/02/2015 0420   HCO3 23.6 08/02/2015 0420   TCO2 24.9 08/02/2015 0420   ACIDBASEDEF 0.9 08/02/2015 0420   O2SAT 98.4 08/02/2015 0420   CBG (last 3)  No results for input(s): GLUCAP in the last 72 hours.  Assessment/Plan: S/P Procedure(s) (LRB): VIDEO ASSISTED THORACOSCOPY (VATS)/WEDGE RESECTION (Left) Left Upper Lobe SEGMENTECTOMY (Left) LYMPH NODE DISSECTION (Left) -  Doing well POD # 2 Encouraged to use PCA as needed- she is leery of constipation No air leak- dc chest  tube Dc posterior tube when < 250 ml/ day Hypokalemia- replete K Continue ambulation scd + enoxaparin for DVT prophylaxis   LOS: 2 days    [name] Richards 08/03/2015

## 2015-08-03 NOTE — Discharge Summary (Signed)
Physician Discharge Summary  Patient ID: [name] Richards MRN: [id number] DOB/AGE: June 04, 1945 70 y.o.  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  [name] Richards is a 70 year old woman with a history of endometrial and cervical cancer in 1997, recent vulvar cancer, hypertension, hyperlipidemia, hypothyroidism, air pollution-induced asthma, and a partial nephrectomy. She is a lifelong nonsmoker.  She was treated for right pleural effusion back in 2011. At that time she was noted to have 2 small right lung nodules. Those were followed but did not change over time. In December she had a PET/CT as part of her workup for vulvar cancer. It showed a ground glass opacity in the left upper lobe that was mildly hypermetabolic. She recently had a follow-up CT which showed the ground glass opacity was stable in size but there was a possible increase in a solid component. He saw Dr. Lamonte Richards who recommended surgery.  She is a lifelong nonsmoker. She does have a history of air pollution-induced asthma. This first became apparent to her when she was in [place]. She also had  issues with it in [place]. Her appetite is good. Her weight is stable. She denies chest pain, pressure, or tightness. She denies shortness of breath. She's not had any recent wheezing. She has not had any unusual headaches or visual changes.  Patient  Was admitted electively for the resection.   Discharged Condition: good  Hospital Course: The patient was admitted electively and taken to the operating room on 08/01/2015 at which time she underwent the below described lung resection. She tolerated it well and was taken to the postanesthesia care unit in stable condition  Postoperative hospital course:  The patient has overall progressed nicely.  Chest tubes have been discontinued in a routine stepwise manner based on clinical exam and serial chest radiographs.  She has maintained stable hemodynamics throughout.Pain has been adequately controlled with PCA and transitioned to oral meds.She has an expected acute blood loss anemia which is stable. Renal function is within normal range. Oxygen is being weaned using standard measures with incentive spirometry and pulmonary toilet. Incisions are healing well without evidence of infection. She is tolerating diet. At time of discharge she is felt to be stable. Pathology is listed below.  Consults: None  Significant Diagnostic Studies: routine post-op labs and CXR  Treatments: surgery:    DATE OF PROCEDURE: 08/01/2015 DATE OF DISCHARGE:   OPERATIVE REPORT   PREOPERATIVE DIAGNOSIS: Ground-glass opacity, left upper lobe.  POSTOPERATIVE DIAGNOSIS: Adenocarcinoma, left upper lobe.  PROCEDURE: Left video-assisted thoracoscopy, wedge resection, thoracoscopic lingular sparing left upper lobectomy, lymph node dissection, On-Q local anesthetic catheter placement.  SURGEON: [name] Standard. Roxan Richards, M.D.  ASSISTANT: [name] Richards, P.A.-C.  ANESTHESIA: General.  FINDINGS: Amorphous mass in apex of left  upper lobe. 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 Due to radiographic suggestion of an invasive component, segmentectomy completed. Margins negative for tumor. Discharge Exam: Blood pressure 143/69, pulse 66, temperature 98.4 F (36.9 C), temperature source Oral, resp. rate 18, height 5' 5.25" (1.657 m), weight 151 lb 14.4 oz (68.9 kg), SpO2 97 %.   General appearance: alert, cooperative and no distress Neurologic: intact Heart: regular rate and rhythm Lungs: clear to auscultation bilaterally Wound: clean and dry  Disposition: 01-Home or Self Care   pathology for JNAE, THOMASTON ([id number]) [name]: 08/01/2015 Client: [facility] Accession: [id number] Received: 08/01/2015 [place] Charon DOB: Oct 03, 1945 Age: 48 Gender: F Reported: 08/02/2015 [address] Patient Ph: [phone] MRN #: [id number] [place], [place] [address] Visit #: [id number].[email or web address] Chart #: Phone:  Fax: CC: REPORT OF SURGICAL PATHOLOGY FINAL DIAGNOSIS Diagnosis 1. Lung, resection (segmental or lobe), Left Upper Lobe - ADENOCARCINOMA, WELL DIFFERENTIATED, SPANNING 2.5 CM. - THE SURGICAL RESECTION MARGINS ARE NEGATIVE FOR CARCINOMA. - THERE IS NO EVIDENCE OF CARCINOMA IN 1 OF 1 LYMPH NODE (0/1). - SEE ONCOLOGY TABLE BELOW. 2. Lymph node, biopsy, L 5 - THERE IS NO EVIDENCE OF CARCINOMA IN 1 OF 1 LYMPH NODE (0/1). 3. Lymph node, biopsy, L 5 #2 - THERE IS NO EVIDENCE OF CARCINOMA IN 1 OF 1 LYMPH NODE (0/1). 4. Lymph node, biopsy, 11 L - THERE IS NO EVIDENCE OF CARCINOMA IN 1 OF 1 LYMPH NODE (0/1). 5. Lung, wedge biopsy/resection, Left upper lobe - BENIGN LUNG PARENCHYMA. - THERE IS NO EVIDENCE OF CARCINOMA IN 1 OF 1 LYMPH NODE (0/1). 6. Lymph node, biopsy, 10 L - THERE IS NO EVIDENCE OF CARCINOMA IN 1 OF 1 LYMPH NODE (0/1). 7. Lymph node, biopsy, 9 - THERE IS NO EVIDENCE OF CARCINOMA IN 1 OF 1 LYMPH NODE (0/1). Microscopic Comment 1.  LUNG Specimen, including laterality: Left upper lobe. Procedure: Lobectomy. Specimen integrity (intact/disrupted): Intact. Tumor site: Left upper lobe. Tumor focality: Unifocal. Maximum tumor size (gross measurement): 2.5 cm Histologic type: Adenocarcinoma Grade: Well differentiated Margins: Negative for carcinoma 1 of 3 FINAL for MARIELL, NESTER A ([id number]) Microscopic Comment(continued) Distance to closest margin (cm): Greater than 2.0 cm to all margins. Visceral pleura invasion: Not identified. Tumor extension: Confined to lung parenchyma. Treatment effect (if treated with neoadjuvant therapy): N/A Lymph -Vascular invasion: Not identified. Lymph nodes: Number examined - 7; Number N1 nodes positive 0; Number N2 nodes positive 0 TNM code: pT1b, pN0 Ancillary studies: Can be performed upon clinician request. Non-neoplastic lung: No significant findings. Comments: The adenocarcinoma has a lipidic growth pattern (so-called adenocarcinoma in situ). (JBK:ecj 08/02/2015) Enid Cutter MD Pathologist, Electronic Signature (Case signed 08/02/2015) Intraoperative Diagnosis 1. LEFT UPPER LOBE: AT LEAST ADENOCARCINOMA IN SITU. MARGINS ARE NEGATIVE. (RAH) Specimen Gross and Clinical Information Specimen(s) Obtained: 1. Lung, resection (segmental or lobe), Left Upper Lobe 2. Lymph node, biopsy, L 5 3. Lymph node, biopsy, L 5 #2 4. Lymph node, biopsy, 11 L 5. Lung, wedge biopsy/resection, Left upper lobe 6. Lymph node, biopsy, 10 L 7. Lymph node, biopsy, 9 Specimen Clinical Information 1. Left upper lobe nodule (nt) Gross 1. Received fresh for rapid intraoperative consultation and consists of a 53 grams, 11.4 x 6.5 x 3.2 cm portion of lung parenchyma with mutiple stapled resection margins. The pleura is pink purple, smooth, with mild anthracosis and suture is present designating a possible area of interest.  Sectioning reveals an ill defined area of tan white fibrotic parenchyma measuring  approximately 2.5 x 1.5 x 1.0 cm. The area measures approximately 1.5 cm from the closest resection margin. The closest resection margin in the representative portion of the area of interest is submitted for frozen section analysis. The remaining parenchyma is tan and red spongy with mild anthracosis. Representatives sections are submitted in eight cassettes. Along the staple line a 0.6 cm gray black, anthracotic possible lymph node is identified. A= resection margin frozen section remnant. B= area of interest frozen section remnant. C-F= remaining area of interest. G= gross uninvolved parenchyma. H= one possible lymph node. (KL:gt, 08/01/15) 2. Received in saline and consists of a 0.9 x 0.8 x 0.3 cm aggregate of tan gray, anthracotic soft tissue. The specimen is entirely submitted in one cassette. 2 of 3 FINAL for DIALA, WAXMAN A [phone]) Gross(continued) 3. Received in saline and consists of a 1.1 x 1.0 x 0.3 cm piece of tan yellow, anthracotic soft tissue, the specimen is entirely submitted in one cassette. 4. Received in saline and consists of a 0.6 x 0.2 cm tan gray, anthracotic possible lymph node. The specimen is entirely submitted in one cassettes. 5. Received in saline and consists of a 38 gram, 10.0 x 7.7 x 2.8 cm piece of lung parenchyma with multiple stapled resection lines. The pleural surface is pink purple, smooth, with mild anthracosis. Removing the staple lines reveals an area of red brown hemorrhagic parenchyma measuring approximately 1.5 cm from the bronchial and vascular resection margins. The area of hemorrhage is grossly suggestive of previous wedge biopsy site. The remaining parenchyma is tan red and spongy. Adjacent to the staple line along the hilum a 0.9 cm in greatest dimension tan gray, anthracotic possible lymph node identified. Block summary: 6 blocks submitted. A= bronchial and vascular resection margins. B-D= hemorrhagic parenchyma. E= grossly  unremarkable parenchyma F= one possible lymph node. (KL:gt, 08/02/15) 6. Received in saline and consists of a 1.9 x 0.8 x 0.3 cm piece of tan pink, anthracotic possible lymph node. The specimen is entirely submitted in one cassette. 7. Received in saline and consists of a 0.8 x 0.5 x 0.4 cm tan gray, anthracotic possible lymph node. The specimen is entirely submitted in one cassette. Report    Medication List    TAKE these medications        acidophilus Caps capsule  Take 1 capsule by mouth daily.     albuterol 108 (90 Base) MCG/ACT inhaler  Commonly known as:  PROVENTIL HFA;VENTOLIN HFA  Inhale 2 puffs into the lungs every 6 (six) hours as needed for wheezing or shortness of breath. Reported on 06/29/2015     benazepril-hydrochlorthiazide 20-12.5 MG tablet  Commonly known as:  LOTENSIN HCT  Take 1 tablet by mouth daily.     cholecalciferol 1000 units tablet  Commonly known as:  VITAMIN D  Take 1,000 Units by mouth 2 (two) times daily.     dicyclomine 20 MG tablet  Commonly known as:  BENTYL  Take 1 tablet (20 mg total) by mouth every 6 (six) hours as needed for spasms (for abdominal cramping).     docusate sodium 100 MG capsule  Commonly known as:  COLACE  Take 100 mg by mouth 2 (two) times daily.     levothyroxine 75 MCG tablet  Commonly known as:  SYNTHROID, LEVOTHROID  Take 75 mcg by mouth daily.     magnesium gluconate 500 MG tablet  Commonly known as:  MAGONATE  Take 500 mg by mouth every morning.     multivitamin with minerals tablet  Take 1 tablet by mouth daily.     OIL OF OREGANO PO  Take 1 capsule by mouth daily.     omega-3 acid ethyl esters 1 g capsule  Commonly known as:  LOVAZA  Take 1 g by mouth 2 (two) times daily.     ondansetron 8 MG disintegrating tablet  Commonly known as:  ZOFRAN ODT  Take 1 tablet (8 mg total) by mouth every 8 (eight) hours as needed for nausea or vomiting.     simvastatin 20 MG tablet  Commonly known as:  ZOCOR  Take  20 mg by mouth every evening.     traMADol 50 MG tablet  Commonly known as:  ULTRAM  Take 1 tablet (50 mg total) by mouth every 6 (six) hours as needed (mild pain).     valACYclovir 1000 MG tablet  Commonly known as:  VALTREX  Take 1,000 mg by mouth 2 (two) times daily as needed (for 2 days for cold sores). Reported on 06/29/2015     vitamin C 500 MG tablet  Commonly known as:  ASCORBIC ACID  Take 500 mg by mouth daily.       Follow-up Information    Follow up with Melrose Nakayama, MD.   Specialty:  Cardiothoracic Surgery   Why:  08/21/2015 at 2:45 PM to see the surgeon. Please obtain a chest x-ray at 2:15 PM at [facility] which is located in the same office complex   Contact information:   [address] [place] [id number] [phone]       Follow up with [facility].   Specialty:  Cardiothoracic Surgery   Why:  nurse appointment for suture removal on 08/10/2015 at 10:30 AM   Contact information:   [address] [place], [place]      Signed: John Richards 08/06/2015, 12:54 PM

## 2015-08-04 ENCOUNTER — Inpatient Hospital Stay (HOSPITAL_COMMUNITY): Payer: Medicare Other

## 2015-08-04 LAB — BASIC METABOLIC PANEL
Anion gap: 6 (ref 5–15)
BUN: 7 mg/dL (ref 6–20)
CO2: 28 mmol/L (ref 22–32)
Calcium: 8.5 mg/dL — ABNORMAL LOW (ref 8.9–10.3)
Chloride: 98 mmol/L — ABNORMAL LOW (ref 101–111)
Creatinine, Ser: 0.6 mg/dL (ref 0.44–1.00)
GFR calc Af Amer: >60 mL/min (ref >60)
GFR calc non Af Amer: >60 mL/min (ref >60)
Glucose, Bld: 117 mg/dL — ABNORMAL HIGH (ref 65–99)
Potassium: 3.1 mmol/L — ABNORMAL LOW (ref 3.5–5.1)
Sodium: 132 mmol/L — ABNORMAL LOW (ref 135–145)

## 2015-08-04 NOTE — Progress Notes (Signed)
Patient ID: [name] Richards, female   DOB: Jan 12, 1946, 70 y.o.   MRN: [id number] TCTS DAILY ICU PROGRESS NOTE                   [address]            [place] [address]          [phone]   3 Days Post-Op Procedure(s) (LRB): VIDEO ASSISTED THORACOSCOPY (VATS)/WEDGE RESECTION (Left) Left Upper Lobe SEGMENTECTOMY (Left) LYMPH NODE DISSECTION (Left)  Total Length of Stay:  LOS: 3 days   Subjective: Complaint of nausea today, bowels moved twice yesterday  Objective: Vital signs in last 24 hours: Temp:  [97.4 F (36.3 C)-99 F (37.2 C)] 97.4 F (36.3 C) (06/24 0700) Pulse Rate:  [61-73] 61 (06/24 0700) Cardiac Rhythm:  [-] Normal sinus rhythm (06/24 0740) Resp:  [13-20] 18 (06/24 0700) BP: (129-175)/(51-82) 175/77 mmHg (06/24 0700) SpO2:  [98 %-100 %] 99 % (06/24 0700)  Filed Weights   08/01/15 1653  Weight: 151 lb 14.4 oz (68.9 kg)    Weight change:    Hemodynamic parameters for last 24 hours:    Intake/Output from previous day: 06/23 0701 - 06/24 0700 In: 825 [P.O.:720; I.V.:105] Out: 290 [Chest Tube:290]  Intake/Output this shift: Total I/O In: 360 [P.O.:360] Out: 150 [Chest Tube:150]  Current Meds: Scheduled Meds: . acetaminophen  1,000 mg Oral Q6H   Or  . acetaminophen (TYLENOL) oral liquid 160 mg/5 mL  1,000 mg Oral Q6H  . acidophilus  1 capsule Oral Daily  . antiseptic oral rinse  7 mL Mouth Rinse BID  . benazepril  20 mg Oral Daily  . bisacodyl  10 mg Oral Daily  . docusate sodium  100 mg Oral BID  . enoxaparin (LOVENOX) injection  40 mg Subcutaneous Q24H  . hydrochlorothiazide  12.5 mg Oral Daily  . HYDROmorphone   Intravenous Q4H  . levothyroxine  75 mcg Oral QAC breakfast  . lung surgery book   Does not apply Once  . magnesium gluconate  500 mg Oral q morning - 10a  . omega-3 acid ethyl esters  1 g Oral BID  . pantoprazole  40 mg Oral Daily  . polyethylene glycol  17 g Oral Q2000  . senna-docusate  1 tablet Oral QHS  .  simvastatin  20 mg Oral QPM   Continuous Infusions:  PRN Meds:.albuterol, dicyclomine, diphenhydrAMINE **OR** diphenhydrAMINE, hydrocortisone ointment, naloxone **AND** sodium chloride flush, ondansetron (ZOFRAN) IV, potassium chloride, traMADol, zolpidem  General appearance: alert, cooperative and no distress Neurologic: intact Heart: regular rate and rhythm, S1, S2 normal, no murmur, click, rub or gallop Lungs: diminished breath sounds LLL Abdomen: soft, non-tender; bowel sounds normal; no masses,  no organomegaly Extremities: extremities normal, atraumatic, no cyanosis or edema and Homans sign is negative, no sign of DVT Wound: no air leak  Lab Results: CBC: Recent Labs  08/02/15 0550 08/03/15 0640  WBC 7.7 7.4  HGB 10.0* 10.3*  HCT 32.0* 33.1*  PLT 167 172   BMET:  Recent Labs  08/03/15 0640 08/04/15 0555  NA 134* 132*  K 3.3* 3.1*  CL 99* 98*  CO2 28 28  GLUCOSE 124* 117*  BUN 8 7  CREATININE 0.64 0.60  CALCIUM 8.6* 8.5*    PT/INR: No results for input(s): LABPROT, INR in the last 72 hours. Radiology: Dg Chest Port 1 View  08/04/2015  CLINICAL DATA:  Status post partial lobectomy of the lung. EXAM: PORTABLE CHEST 1  VIEW COMPARISON:  Chest x-rays dated 08/03/2015 and 08/02/2015. FINDINGS: Cardiomediastinal silhouette is stable. Left IJ central line is stable in position with tip at the level of the upper SVC. One of the left-sided chest tubes remains in stable position, the other removed in the interval. No pneumothorax seen. Improved aeration at the left lung base, suggesting improving atelectasis. Suspect trace left pleural effusion. IMPRESSION: 1. Slightly improved aeration at the left lung base suggesting decreased atelectasis. Suspect trace left pleural effusion. No new lung findings. 2. Interval removal of a left-sided chest tube. More medial chest tube remains in stable position. No pneumothorax seen. Electronically Signed   By: Franki Cabot M.D.   On: 08/04/2015  08:08     Assessment/Plan: S/P Procedure(s) (LRB): VIDEO ASSISTED THORACOSCOPY (VATS)/WEDGE RESECTION (Left) Left Upper Lobe SEGMENTECTOMY (Left) LYMPH NODE DISSECTION (Left) Mobilize  150 from ct  Past shift No air leak D/c on q and chest tube      Grace Isaac 08/04/2015 9:56 AM

## 2015-08-05 ENCOUNTER — Inpatient Hospital Stay (HOSPITAL_COMMUNITY): Payer: Medicare Other

## 2015-08-05 LAB — BASIC METABOLIC PANEL
Anion gap: 7 (ref 5–15)
BUN: 7 mg/dL (ref 6–20)
CO2: 29 mmol/L (ref 22–32)
Calcium: 8.6 mg/dL — ABNORMAL LOW (ref 8.9–10.3)
Chloride: 96 mmol/L — ABNORMAL LOW (ref 101–111)
Creatinine, Ser: 0.61 mg/dL (ref 0.44–1.00)
GFR calc Af Amer: >60 mL/min (ref >60)
GFR calc non Af Amer: >60 mL/min (ref >60)
Glucose, Bld: 124 mg/dL — ABNORMAL HIGH (ref 65–99)
Potassium: 2.9 mmol/L — ABNORMAL LOW (ref 3.5–5.1)
Sodium: 132 mmol/L — ABNORMAL LOW (ref 135–145)

## 2015-08-05 LAB — CBC
HCT: 30.4 % — ABNORMAL LOW (ref 36.0–46.0)
Hemoglobin: 9.7 g/dL — ABNORMAL LOW (ref 12.0–15.0)
MCH: 21 pg — ABNORMAL LOW (ref 26.0–34.0)
MCHC: 31.9 g/dL (ref 30.0–36.0)
MCV: 65.8 fL — ABNORMAL LOW (ref 78.0–100.0)
Platelets: 188 10*3/uL (ref 150–400)
RBC: 4.62 MIL/uL (ref 3.87–5.11)
RDW: 14.1 % (ref 11.5–15.5)
WBC: 8.3 10*3/uL (ref 4.0–10.5)

## 2015-08-05 MED ORDER — DIPHENHYDRAMINE HCL 25 MG PO CAPS: 25.0000 mg | ORAL_CAPSULE | Freq: Every evening | ORAL | Status: DC | PRN

## 2015-08-05 MED ORDER — LOPERAMIDE HCL 2 MG PO CAPS
2.0000 mg | ORAL_CAPSULE | Freq: Once | ORAL | Status: AC
  Administered 2015-08-05: 2 mg via ORAL
  Filled 2015-08-05: qty 1

## 2015-08-05 MED ORDER — POTASSIUM CHLORIDE CRYS ER 20 MEQ PO TBCR
40.0000 meq | EXTENDED_RELEASE_TABLET | Freq: Once | ORAL | Status: AC
  Administered 2015-08-05: 40 meq via ORAL
  Filled 2015-08-05: qty 2

## 2015-08-05 MED ORDER — TRAMADOL HCL 50 MG PO TABS: 50.0000 mg | ORAL_TABLET | Freq: Four times a day (QID) | ORAL | Status: DC | PRN

## 2015-08-05 NOTE — Progress Notes (Addendum)
      [address]       [place] [address]             [phone]       4 Days Post-Op Procedure(s) (LRB): VIDEO ASSISTED THORACOSCOPY (VATS)/WEDGE RESECTION (Left) Left Upper Lobe SEGMENTECTOMY (Left) LYMPH NODE DISSECTION (Left)  Subjective: Patient without complaints. She is about to take a walk.  Objective: Vital signs in last 24 hours: Temp:  [97.6 F (36.4 C)-98.9 F (37.2 C)] 98.9 F (37.2 C) (06/24 2323) Pulse Rate:  [67-70] 67 (06/24 2323) Cardiac Rhythm:  [-] Normal sinus rhythm (06/25 0730) Resp:  [12-18] 16 (06/25 0400) BP: (145-169)/(74-81) 147/81 mmHg (06/24 2323) SpO2:  [94 %-99 %] 98 % (06/25 0000)      Intake/Output from previous day: 06/24 0701 - 06/25 0700 In: 360 [P.O.:360] Out: 200 [Chest Tube:200]   Physical Exam:  Cardiovascular: RRR Pulmonary: Clear to auscultation bilaterally; no rales, wheezes, or rhonchi. Abdomen: Soft, non tender, bowel sounds present. Wounds: Clean and dry.  No erythema or signs of infection.   Lab Results: CBC: Recent Labs  08/03/15 0640 08/05/15 0400  WBC 7.4 8.3  HGB 10.3* 9.7*  HCT 33.1* 30.4*  PLT 172 188   BMET:  Recent Labs  08/04/15 0555 08/05/15 0400  NA 132* 132*  K 3.1* 2.9*  CL 98* 96*  CO2 28 29  GLUCOSE 117* 124*  BUN 7 7  CREATININE 0.60 0.61  CALCIUM 8.5* 8.6*    PT/INR: No results for input(s): LABPROT, INR in the last 72 hours.  Dg Chest 2 View  08/05/2015  CLINICAL DATA:  Post VATS 08/01/2015, LEFT upper lobe resection, nausea and diarrhea since surgery, asthma, hypertension EXAM: CHEST  2 VIEW COMPARISON:  08/04/2015 FINDINGS: LEFT jugular central venous catheter with tip projecting over SVC. Interval removal of LEFT thoracostomy tube. Normal heart size, mediastinal contours, and pulmonary vascularity. Postsurgical changes LEFT upper lobe. Underlying emphysematous and bronchitic changes. Scarring lower lateral RIGHT chest. Suspected small loculated  pneumothorax at lateral upper LEFT chest. Bones demineralized. IMPRESSION: Probable small loculated pneumothorax at the upper LEFT chest laterally. Emphysematous changes with RIGHT lung scarring, LEFT basilar atelectasis and LEFT upper lobe postoperative changes. Electronically Signed   By: Lavonia Dana M.D.   On: 08/05/2015 09:29      Assessment/Plan:  1. CV - SR in the 60's. On Benazepril 20 mg daily and HCTZ 12.5 mg daily. 2.  Pulmonary - On room air. CXR shows small left pneumothorax, emphysematous changes, left base atelectasis. Encourage incentive spirometer.  3. Anemia-H and H decreased to 9.7 and 30.4 4. Hypokalemia-supplement potassium 5. Stop PCA and remove central line 6. Will obtain CXR in am and if stable, will discharge in am.  ZIMMERMAN,DONIELLE MPA-C 08/05/2015,9:44 AM  Some loose stools today , patient did not take miralax Follow up chest xray in am Poss home tomorrow I have seen and examined Rhunette Croft and agree with the above assessment  and plan.  Grace Isaac MD [facility] [phone] Office [phone] 08/05/2015 10:45 AM

## 2015-08-06 ENCOUNTER — Other Ambulatory Visit: Payer: Self-pay | Admitting: Physician Assistant

## 2015-08-06 ENCOUNTER — Inpatient Hospital Stay (HOSPITAL_COMMUNITY): Payer: Medicare Other

## 2015-08-06 LAB — BASIC METABOLIC PANEL
Anion gap: 9 (ref 5–15)
BUN: 7 mg/dL (ref 6–20)
CO2: 28 mmol/L (ref 22–32)
Calcium: 8.8 mg/dL — ABNORMAL LOW (ref 8.9–10.3)
Chloride: 98 mmol/L — ABNORMAL LOW (ref 101–111)
Creatinine, Ser: 0.68 mg/dL (ref 0.44–1.00)
GFR calc Af Amer: >60 mL/min (ref >60)
GFR calc non Af Amer: >60 mL/min (ref >60)
Glucose, Bld: 101 mg/dL — ABNORMAL HIGH (ref 65–99)
Potassium: 3.5 mmol/L (ref 3.5–5.1)
Sodium: 135 mmol/L (ref 135–145)

## 2015-08-06 MED ORDER — POTASSIUM CHLORIDE CRYS ER 20 MEQ PO TBCR
20.0000 meq | EXTENDED_RELEASE_TABLET | Freq: Every day | ORAL | Status: DC
  Administered 2015-08-06: 20 meq via ORAL
  Filled 2015-08-06: qty 1

## 2015-08-06 NOTE — Progress Notes (Signed)
Discharge instructions reviewed with patient and her friend who is at bedside. Prescription x 2 given (tramadol and potassium KDur). S/s infection reviewed, pt verbalized understanding of instructions. Incision care reviewed. Appointment information given.   PIV removed. VSS. Pt in no acute distress. Volunteer services called. Patient discharged via wheelchair.

## 2015-08-06 NOTE — Progress Notes (Signed)
5 Days Post-Op Procedure(s) (LRB): VIDEO ASSISTED THORACOSCOPY (VATS)/WEDGE RESECTION (Left) Left Upper Lobe SEGMENTECTOMY (Left) LYMPH NODE DISSECTION (Left) Subjective: Didn't sleep well last night Some incisional pain, but not too bad  Objective: Vital signs in last 24 hours: Temp:  [98.2 F (36.8 C)-99.1 F (37.3 C)] 98.2 F (36.8 C) (06/26 0559) Pulse Rate:  [68-71] 69 (06/26 0600) Cardiac Rhythm:  [-] Normal sinus rhythm (06/26 0600) Resp:  [18-21] 18 (06/26 0600) BP: (134-155)/(67-72) 155/71 mmHg (06/26 0600) SpO2:  [96 %-100 %] 98 % (06/26 0600)  Hemodynamic parameters for last 24 hours:    Intake/Output from previous day: 06/25 0701 - 06/26 0700 In: 1560 [P.O.:1560] Out: -  Intake/Output this shift:    General appearance: alert, cooperative and no distress Neurologic: intact Heart: regular rate and rhythm Lungs: clear to auscultation bilaterally Wound: clean and dry  Lab Results:  Recent Labs  08/05/15 0400  WBC 8.3  HGB 9.7*  HCT 30.4*  PLT 188   BMET:  Recent Labs  08/04/15 0555 08/05/15 0400  NA 132* 132*  K 3.1* 2.9*  CL 98* 96*  CO2 28 29  GLUCOSE 117* 124*  BUN 7 7  CREATININE 0.60 0.61  CALCIUM 8.5* 8.6*    PT/INR: No results for input(s): LABPROT, INR in the last 72 hours. ABG    Component Value Date/Time   PHART 7.374 08/02/2015 0420   HCO3 23.6 08/02/2015 0420   TCO2 24.9 08/02/2015 0420   ACIDBASEDEF 0.9 08/02/2015 0420   O2SAT 98.4 08/02/2015 0420   CBG (last 3)  No results for input(s): GLUCAP in the last 72 hours.  Assessment/Plan: S/P Procedure(s) (LRB): VIDEO ASSISTED THORACOSCOPY (VATS)/WEDGE RESECTION (Left) Left Upper Lobe SEGMENTECTOMY (Left) LYMPH NODE DISSECTION (Left) -  Doing well   CXR Ok   Will send home on [place] for hypokalemia  Dc home today   LOS: 5 days    Melrose Nakayama 08/06/2015

## 2015-08-06 NOTE — Care Management Note (Signed)
Case Management Note  Patient Details  Name: [name] Richards MRN: [id number] Date of Birth: 1946/01/27  Subjective/Objective:  Left VAT                Action/Plan: Discharge Planning: NCM spoke to pt at bedside. States she lives alone but her ex-husband, Christy Sartorius and close friends will be there to assist her at home. States she was independent prior to hospital stay.  PCP- Harlan Stains MD Expected Discharge Date:  08/06/2015               Expected Discharge Plan:  Home/Self Care  In-House Referral:  NA  Discharge planning Services  CM Consult  Post Acute Care Choice:  NA Choice offered to:  NA  DME Arranged:  N/A DME Agency:  NA  HH Arranged:  NA HH Agency:  NA  Status of Service:  Completed, signed off  If discussed at [place] of Stay Meetings, dates discussed:    Additional Comments:  Erenest Rasher, RN 08/06/2015, 10:40 AM

## 2015-08-06 NOTE — Care Management Important Message (Signed)
Important Message  Patient Details  Name: [name] Richards MRN: [id number] Date of Birth: August 31, 1945   Medicare Important Message Given:  Yes    Nathen May 08/06/2015, 10:41 AM

## 2015-08-10 ENCOUNTER — Ambulatory Visit (INDEPENDENT_AMBULATORY_CARE_PROVIDER_SITE_OTHER): Payer: Self-pay | Admitting: *Deleted

## 2015-08-10 DIAGNOSIS — Z09 Encounter for follow-up examination after completed treatment for conditions other than malignant neoplasm: Secondary | ICD-10-CM

## 2015-08-10 DIAGNOSIS — C3412 Malignant neoplasm of upper lobe, left bronchus or lung: Secondary | ICD-10-CM

## 2015-08-10 DIAGNOSIS — C3490 Malignant neoplasm of unspecified part of unspecified bronchus or lung: Secondary | ICD-10-CM

## 2015-08-10 DIAGNOSIS — Z4802 Encounter for removal of sutures: Secondary | ICD-10-CM

## 2015-08-10 NOTE — Progress Notes (Signed)
[name] Richards returns s/p left thoracic surgery for the removaL of two sutures from previous chest tube sites. This was easily done.  These sites as well as the left mini thoracotomy incision are all very well healed. She is tolerating her diet well and has no problems with bowels.She is not requiring narcotics. She is frustrated that she can not sustain her physical endurance that she had prior to surgery, but was reassured.  She will return as scheduled with a follow up chest xray.

## 2015-08-15 ENCOUNTER — Encounter: Payer: Self-pay | Admitting: *Deleted

## 2015-08-20 ENCOUNTER — Other Ambulatory Visit: Payer: Self-pay | Admitting: Thoracic Surgery (Cardiothoracic Vascular Surgery)

## 2015-08-20 DIAGNOSIS — C3490 Malignant neoplasm of unspecified part of unspecified bronchus or lung: Secondary | ICD-10-CM

## 2015-08-21 ENCOUNTER — Ambulatory Visit (INDEPENDENT_AMBULATORY_CARE_PROVIDER_SITE_OTHER): Payer: Self-pay | Admitting: Thoracic Surgery (Cardiothoracic Vascular Surgery)

## 2015-08-21 ENCOUNTER — Ambulatory Visit
Admission: RE | Admit: 2015-08-21 | Discharge: 2015-08-21 | Disposition: A | Discharge status: Home / Self Care | Payer: Medicare Other | Source: Ambulatory Visit | Attending: Thoracic Surgery (Cardiothoracic Vascular Surgery) | Admitting: Thoracic Surgery (Cardiothoracic Vascular Surgery)

## 2015-08-21 VITALS — BP 159/87 | HR 70 | Resp 16 | Ht 62.25 in | Wt 146.0 lb

## 2015-08-21 DIAGNOSIS — C3412 Malignant neoplasm of upper lobe, left bronchus or lung: Secondary | ICD-10-CM

## 2015-08-21 DIAGNOSIS — C3490 Malignant neoplasm of unspecified part of unspecified bronchus or lung: Secondary | ICD-10-CM

## 2015-08-21 DIAGNOSIS — Z09 Encounter for follow-up examination after completed treatment for conditions other than malignant neoplasm: Secondary | ICD-10-CM

## 2015-08-21 NOTE — Progress Notes (Signed)
[address]       [place] [address]             [phone]       HPI: Mrs. Pharr returns today for a scheduled postoperative follow-up visit.  She is a 70 year old woman who was found to have a groundglass opacity in the left upper lobe. 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 A CT was done to rule out metastatic disease and now is healthy groundglass nodule was found. She is a lifelong nonsmoker. I did a thoracoscopic lingular sparing left upper lobectomy on 08/01/2015.  Her postoperative course was uncomplicated and she went home on postoperative day #5. Since then she's been doing well. She is not having any pain. Her exercise tolerance is improving. She is anxious to go back to the gym and to start driving. She did have some numbness in her left hand which has resolved over the past few days.  Past Medical History  Diagnosis Date  . Asthma   . Hypertension   . Thyroid disease   . Cervical cancer ([facility]) 1997    poorly differentiated cervical carcinoma. S/p TAH and radiation x6 wks   . Endometrial cancer ([facility]) 1997    coincidental endometrial cancer also  . Hyperlipemia   . 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 Vertebral body hemangioma     T12  . Small bowel obstruction ([facility])     12/2008, repeat SBO in 02/2011 also with terminal ilietis   . Hypothyroidism   . Complication of anesthesia     took 2 days to wake up after 1 surgery       pt cants use a adult cath,needs peds. cath due to past injury  . Constipation     very worried about this      Current Outpatient Prescriptions  Medication Sig Dispense Refill  . acidophilus (RISAQUAD) CAPS Take 1 capsule by mouth daily.    Marland Kitchen albuterol (PROVENTIL HFA;VENTOLIN HFA) 108 (90 Base) MCG/ACT inhaler Inhale 2 puffs into the lungs every 6 (six) hours as needed for wheezing or shortness of breath. Reported on 06/29/2015    . benazepril-hydrochlorthiazide (LOTENSIN  HCT) 20-12.5 MG per tablet Take 1 tablet by mouth daily.    . cholecalciferol (VITAMIN D) 1000 UNITS tablet Take 1,000 Units by mouth 2 (two) times daily.     Marland Kitchen dicyclomine (BENTYL) 20 MG tablet Take 1 tablet (20 mg total) by mouth every 6 (six) hours as needed for spasms (for abdominal cramping). 20 tablet 0  . docusate sodium (COLACE) 100 MG capsule Take 100 mg by mouth 2 (two) times daily.    Marland Kitchen levothyroxine (SYNTHROID, LEVOTHROID) 75 MCG tablet Take 75 mcg by mouth daily.    Marland Kitchen loratadine (CLARITIN) 10 MG tablet Take 10 mg by mouth daily as needed for allergies.    . magnesium gluconate (MAGONATE) 500 MG tablet Take 500 mg by mouth every morning.     . Multiple Vitamins-Minerals (MULTIVITAMIN WITH MINERALS) tablet Take 1 tablet by mouth daily.    Marland Kitchen omega-3 acid ethyl esters (LOVAZA) 1 G capsule Take 1 g by mouth 2 (two) times daily.    . ondansetron (ZOFRAN ODT) 8 MG disintegrating tablet Take 1 tablet (8 mg total) by mouth every 8 (eight) hours as needed for nausea or vomiting. 10 tablet 0  . simvastatin (ZOCOR) 20 MG tablet Take 20 mg by mouth  every evening.    . valACYclovir (VALTREX) 1000 MG tablet Take 1,000 mg by mouth 2 (two) times daily as needed (for 2 days for cold sores). Reported on 06/29/2015    . vitamin C (ASCORBIC ACID) 500 MG tablet Take 500 mg by mouth daily.     No current facility-administered medications for this visit.    Physical Exam BP 159/87 mmHg  Pulse 70  Resp 16  Ht 5' 2.25" (1.581 m)  Wt 146 lb (66.225 kg)  BMI 26.49 kg/m2  SpO65 20% 27-year-old woman in no acute distress Alert and oriented 3 with no focal deficits Incisions healing well Cardiac regular rate and rhythm normal S1 and S2 Lungs clear with equal breath sounds bilaterally  Diagnostic Tests: CHEST 2 VIEW  COMPARISON: 08/06/2014  FINDINGS: Cardiomediastinal silhouette is stable. Again noted postsurgical changes and surgical clips in left hilum. There is subtle increased soft tissue  density left suprahilar region. Adenopathy cannot be excluded. Degenerative changes and mild thoracic scoliosis are stable. Tiny loculated pneumothorax or scarring in left upper lobe laterally is stable. No definite apical pneumothorax is noted. Right lung is clear.  IMPRESSION: Again noted postsurgical changes and surgical clips in left hilum. There is subtle increased soft tissue density left suprahilar region. Adenopathy cannot be excluded. Degenerative changes and mild thoracic scoliosis are stable. Tiny loculated pneumothorax or scarring in left upper lobe laterally is stable. No definite apical pneumothorax is noted. Right lung is clear. Further evaluation with enhanced CT or PET scan is recommended.   Electronically Signed  By: Lahoma Crocker M.D.  On: 08/21/2015 14:27  I personally reviewed the chest x-ray. It shows postoperative changes. I do not think any further investigation of the opacity in the region of the staple line is necessary at this time.  Impression: 70 year old woman with a history of vulvar cancer who had a lingular sparing left upper lobectomy for a stage IA well-differentiated adenocarcinoma on 08/01/2015. She is about 3 weeks out from surgery and doing exceptionally well.  She is not having any pain. Her activities are unrestricted, but I did caution her to build into new activities gradually to avoid causing pain.  She may drive, appropriate precautions were discussed.  She was scheduled to go on a cruise at the end of August. She has rescheduled for April. I think that will be much better for her.  I will have her see Dr. Julien Nordmann in [facility] for oncology consultation, although there should not be any need for chemotherapy.  Plan: Referral to Dr. Julien Nordmann  Return in 2 months with PA and lateral chest x-ray  Melrose Nakayama, MD [facility] [phone]

## 2015-08-28 ENCOUNTER — Telehealth: Payer: Self-pay | Admitting: *Deleted

## 2015-08-28 NOTE — Telephone Encounter (Signed)
Oncology Nurse Navigator Documentation  Oncology Nurse Navigator Flowsheets 08/28/2015  Navigator Encounter Type Telephone  Telephone Outgoing Call  Treatment Phase Other  Barriers/Navigation Needs Coordination of Care  Interventions Coordination of Care  Coordination of Care Appts  Acuity Level 1  Acuity Level 1 Initial guidance, education and coordination as needed  Time Spent with Patient 15   I received referral from Dr. Leonarda Salon office.  I called patient but was unable to reach.  I left vm message to call with my name and phone number.

## 2015-09-05 ENCOUNTER — Telehealth: Payer: Self-pay | Admitting: *Deleted

## 2015-09-05 NOTE — Telephone Encounter (Signed)
Oncology Nurse Navigator Documentation  Oncology Nurse Navigator Flowsheets 09/05/2015  Navigator Encounter Type Introductory phone call;Telephone  Telephone Outgoing Call  Confirmed Diagnosis Date 08/01/2015  Surgery Date 08/01/2015  Treatment Initiated Date 08/01/2015  Treatment Phase Post-Tx Follow-up  Barriers/Navigation Needs Coordination of Care  Interventions Coordination of Care  Coordination of Care Appts  Acuity Level 1  Acuity Level 1 Initial guidance, education and coordination as needed  Time Spent with Patient 15   I called to set up appt for [place] next week.  I was unable to reach but left a vm message with my name and phone number to call.

## 2015-09-10 ENCOUNTER — Telehealth: Payer: Self-pay | Admitting: *Deleted

## 2015-09-10 NOTE — Telephone Encounter (Signed)
Oncology Nurse Navigator Documentation  Oncology Nurse Navigator Flowsheets 09/10/2015  Navigator Encounter Type Telephone  Telephone Outgoing Call  Treatment Phase Post-Tx Follow-up  Barriers/Navigation Needs Coordination of Care  Interventions Coordination of Care  Coordination of Care Appts  Acuity Level 1  Acuity Level 1 Minimal follow up required  Time Spent with Patient 15   I called patient to change appt day and time.  She will be seen at [facility] on 09/27/15 arrive at 12:30 with labs at 12:45 appt with Dr. Julien Nordmann 1:00.  She verbalized understanding of appt time and place.

## 2015-09-21 ENCOUNTER — Telehealth: Payer: Self-pay | Admitting: *Deleted

## 2015-09-21 NOTE — Telephone Encounter (Signed)
Mailed new pt clinic letter to pt.  

## 2015-09-24 ENCOUNTER — Ambulatory Visit: Payer: Medicare Other | Attending: Gynecology | Admitting: Gynecology

## 2015-09-24 VITALS — BP 167/81 | HR 66 | Temp 98.6°F | Resp 18 | Wt 151.8 lb

## 2015-09-24 DIAGNOSIS — Z8249 Family history of ischemic heart disease and other diseases of the circulatory system: Secondary | ICD-10-CM | POA: Diagnosis not present

## 2015-09-24 DIAGNOSIS — K5660 Unspecified intestinal obstruction: Secondary | ICD-10-CM

## 2015-09-24 DIAGNOSIS — Z880 Allergy status to penicillin: Secondary | ICD-10-CM | POA: Diagnosis not present

## 2015-09-24 DIAGNOSIS — Z85118 Personal history of other malignant neoplasm of bronchus and lung: Secondary | ICD-10-CM

## 2015-09-24 DIAGNOSIS — Y842 Radiological procedure and radiotherapy as the cause of abnormal reaction of the patient, or of later complication, without mention of misadventure at the time of the procedure: Secondary | ICD-10-CM | POA: Diagnosis not present

## 2015-09-24 DIAGNOSIS — R197 Diarrhea, unspecified: Secondary | ICD-10-CM | POA: Insufficient documentation

## 2015-09-24 DIAGNOSIS — C519 Malignant neoplasm of vulva, unspecified: Secondary | ICD-10-CM | POA: Diagnosis not present

## 2015-09-24 DIAGNOSIS — L280 Lichen simplex chronicus: Secondary | ICD-10-CM | POA: Insufficient documentation

## 2015-09-24 DIAGNOSIS — Z79899 Other long term (current) drug therapy: Secondary | ICD-10-CM | POA: Insufficient documentation

## 2015-09-24 DIAGNOSIS — E785 Hyperlipidemia, unspecified: Secondary | ICD-10-CM | POA: Insufficient documentation

## 2015-09-24 DIAGNOSIS — I10 Essential (primary) hypertension: Secondary | ICD-10-CM | POA: Insufficient documentation

## 2015-09-24 DIAGNOSIS — E039 Hypothyroidism, unspecified: Secondary | ICD-10-CM | POA: Diagnosis not present

## 2015-09-24 DIAGNOSIS — Z9889 Other specified postprocedural states: Secondary | ICD-10-CM | POA: Insufficient documentation

## 2015-09-24 DIAGNOSIS — Z905 Acquired absence of kidney: Secondary | ICD-10-CM | POA: Diagnosis not present

## 2015-09-24 DIAGNOSIS — J45909 Unspecified asthma, uncomplicated: Secondary | ICD-10-CM | POA: Diagnosis not present

## 2015-09-24 DIAGNOSIS — Z8541 Personal history of malignant neoplasm of cervix uteri: Secondary | ICD-10-CM | POA: Diagnosis not present

## 2015-09-24 DIAGNOSIS — K909 Intestinal malabsorption, unspecified: Secondary | ICD-10-CM

## 2015-09-24 DIAGNOSIS — Z8542 Personal history of malignant neoplasm of other parts of uterus: Secondary | ICD-10-CM | POA: Diagnosis not present

## 2015-09-24 DIAGNOSIS — Z888 Allergy status to other drugs, medicaments and biological substances status: Secondary | ICD-10-CM | POA: Diagnosis not present

## 2015-09-24 DIAGNOSIS — Z8544 Personal history of malignant neoplasm of other female genital organs: Secondary | ICD-10-CM | POA: Diagnosis not present

## 2015-09-24 DIAGNOSIS — C530 Malignant neoplasm of endocervix: Secondary | ICD-10-CM

## 2015-09-24 NOTE — Patient Instructions (Signed)
Plan to follow up in three months or sooner if needed.  Please call for any questions or concerns. 

## 2015-09-24 NOTE — Progress Notes (Signed)
Consult Note: Gyn-Onc   Rhunette Croft 70 y.o. female  No chief complaint on file.   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 No evidence of disease.   Marland Kitchen  Past history of Poorly differentiated endometrial and cervical cancer 1997. No evidence of disease.  Intermittent partial small bowel obstruction. 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 She'll call she has any new symptoms or physical findings.  She's encouraged to stick with her modified diet as that seems to be working well.  . Interval History:  Patient returns today for continuing follow-up as scheduled. Since her last visit she's had no problems in the vulva. However, underwent a VATS procedure for a small lesion in the left lung which is found stable be a stage I a well-differentiated adenocarcinoma (08/01/2015) the patient had an uneventful postoperative recovery.   She has been more vigilant with regard to her dietary modifications and is had no subsequent episodes of small bowel obstruction. She is planning [place] in [place].   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 She underwent a total abdominal hysterectomy and bilateral salpingo-oophorectomy, pelvic and para-aortic lymphadenectomy. She received postoperative whole pelvis radiation therapy.  Her other primary gynecologic problem has been lichen sclerosus managed with when necessary clobetasol.  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  In December 2016 the patient was found to have a new primary squamous cell carcinoma the vulva undergoing a modified radical vulvectomy on  02/06/2015. She underwent a modified radical vulvectomy at [facility] on 02/06/2015. She was found to have a 3 cm vulvar lesion of the posterior vulva extending into the posterior vagina. A rhomboid flap was required to close the incision. Patient had on pelvic postoperative course. She reports she's been doing well. She irrigates the wound twice a day with a tepid water and a blow dryer. She denies any drainage or pain or fever. She has been walking approximate half mile a day.  Final pathology showed some close surgical margins. However, the specimen margins were toward the time of surgery and it is my impression that we were widely around the primary cancer. I discussed these findings with the patient and we've agreed to monitor her closely but not recommend any radiation therapy to the vulva. The patient's agreement with this recommendation.   Review of Systems:10 point review of systems is negative except as noted in interval history.   Vitals: There were no vitals taken for this visit.  Physical Exam: General : The patient is a healthy woman in no acute distress.  HEENT: normocephalic, extraoccular movements normal; neck is supple without thyromegally  Lynphnodes: Supraclavicular and inguinal nodes not enlarged    Abdomen: Soft, non-tender, no ascites, no organomegally, no masses, no hernias  Pelvic:  EGBUS: Normal female mild lichen sclerosis on the anterior vulva.  The perineum is well-healed. There are no lesions.  Lower extremities: No edema or varicosities. Normal range of motion      Allergies  Allergen Reactions  . Codeine Other (See Comments)    Reporting amnesia  . Penicillins Hives, Rash and Other (See Comments)    Has patient had a PCN reaction causing immediate rash, facial/tongue/throat swelling,  SOB or lightheadedness with hypotension: NO Has patient had a PCN reaction causing SEVERE RASH INVOLVING MUCUS MEMBRANES OR SKIN NECROSIS: YES Has patient had a PCN  reaction that REQUIRED HOSPITALIZATION : YES Has patient had a PCN reaction occurring within the last 10 years: NO    . Prochlorperazine Edisylate Other (See Comments)    JAWS LOCKED EXTRAPYRAMIDAL Rx  . Fentanyl Other (See Comments)    hypotension  . Percocet [Oxycodone-Acetaminophen] Other (See Comments)    Made pt feel real funny, never wanting to take it again    Past Medical History:  Diagnosis Date  . 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 Asthma   . Cervical cancer ([place]) 1997   poorly differentiated cervical carcinoma. S/p TAH and radiation x6 wks   . Complication of anesthesia    took 2 days to wake up after 1 surgery       pt cants use a adult cath,needs peds. cath due to past injury  . Constipation    very worried about this  . Endometrial cancer ([facility]) 1997   coincidental endometrial cancer also  . Hyperlipemia   . Hypertension   . Hypothyroidism   . Small bowel obstruction ([facility])    12/2008, repeat SBO in 02/2011 also with terminal ilietis   . Thyroid disease   . Vertebral body hemangioma    T12    Past Surgical History:  Procedure Laterality Date  . ABDOMINAL HYSTERECTOMY  Feb 1997   TAH/BSO, pelvic and periaortic lymphadenectomy   . BACK SURGERY    . LYMPH NODE DISSECTION Left 08/01/2015   Procedure: LYMPH NODE DISSECTION;  Surgeon: Melrose Nakayama, MD;  Location: [facility];  Service: Thoracic;  Laterality: Left;  . PARTIAL NEPHRECTOMY     right  . SEGMENTECOMY Left 08/01/2015   Procedure: Left Upper Lobe SEGMENTECTOMY;  Surgeon: Melrose Nakayama, MD;  Location: [facility];  Service: Thoracic;  Laterality: Left;  . SKIN BIOPSY    . TUBAL LIGATION    . TUMOR REMOVAL     meningioma  T 12  . VIDEO ASSISTED THORACOSCOPY (VATS)/WEDGE RESECTION Left 08/01/2015   Procedure: VIDEO ASSISTED THORACOSCOPY (VATS)/WEDGE RESECTION;  Surgeon: Melrose Nakayama, MD;  [facility];  Service: Thoracic;  Laterality: Left;    Current Outpatient  Prescriptions  Medication Sig Dispense Refill  . acidophilus (RISAQUAD) CAPS Take 1 capsule by mouth daily.    Marland Kitchen albuterol (PROVENTIL HFA;VENTOLIN HFA) 108 (90 Base) MCG/ACT inhaler Inhale 2 puffs into the lungs every 6 (six) hours as needed for wheezing or shortness of breath. Reported on 06/29/2015    . benazepril-hydrochlorthiazide (LOTENSIN HCT) 20-12.5 MG per tablet Take 1 tablet by mouth daily.    . cholecalciferol (VITAMIN D) 1000 UNITS tablet Take 1,000 Units by mouth 2 (two) times daily.     Marland Kitchen dicyclomine (BENTYL) 20 MG tablet Take 1 tablet (20 mg total) by mouth every 6 (six) hours as needed for spasms (for abdominal cramping). 20 tablet 0  . docusate sodium (COLACE) 100 MG capsule Take 100 mg by mouth 2 (two) times daily.    Marland Kitchen levothyroxine (SYNTHROID, LEVOTHROID) 75 MCG tablet Take 75 mcg by mouth daily.    Marland Kitchen loratadine (CLARITIN) 10 MG tablet Take 10 mg by mouth daily as needed for allergies.    . magnesium gluconate (MAGONATE) 500 MG tablet Take 500 mg by mouth every morning.     . Multiple Vitamins-Minerals (MULTIVITAMIN WITH MINERALS) tablet  Take 1 tablet by mouth daily.    Marland Kitchen omega-3 acid ethyl esters (LOVAZA) 1 G capsule Take 1 g by mouth 2 (two) times daily.    . ondansetron (ZOFRAN ODT) 8 MG disintegrating tablet Take 1 tablet (8 mg total) by mouth every 8 (eight) hours as needed for nausea or vomiting. 10 tablet 0  . simvastatin (ZOCOR) 20 MG tablet Take 20 mg by mouth every evening.    . valACYclovir (VALTREX) 1000 MG tablet Take 1,000 mg by mouth 2 (two) times daily as needed (for 2 days for cold sores). Reported on 06/29/2015    . vitamin C (ASCORBIC ACID) 500 MG tablet Take 500 mg by mouth daily.     No current facility-administered medications for this visit.     Social History   Social History  . Marital status: Divorced    Spouse name: N/A  . Number of children: N/A  . Years of education: N/A   Occupational History  . Not on file.   Social History Main Topics   . Smoking status: Never Smoker  . Smokeless tobacco: Never Used  . Alcohol use No  . Drug use: No  . Sexual activity: No   Other Topics Concern  . Not on file   Social History Narrative   Lives at home alone, works out at [employer] 6 days a week, still very active. Has an ex-husband who she is in contact with. Son died of an MI and another son in [place].     Family History  Problem Relation Age of Onset  . Hypertension Other   . Diabetes Other   . Stroke Other   . CAD Other       Marti Sleigh, MD 09/24/2015, 10:54 AM       Consult Note: Gyn-Onc   Rhunette Croft 69 y.o. female  No chief complaint on file.   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 No evidence of disease.  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  Past history of Poorly differentiated endometrial and cervical cancer 1997. No evidence of disease.  Intermittent partial small bowel obstruction. Lichen sclerosis of the vulva (stable)  Plan:  Patient returns seem me in 3 months. She'll call she has any new symptoms or physical findings. She's encouraged to reduce the amount of fiber in her diet. Also she finds that there are particular foods cause diarrhea she should avoid them. We had a lengthy discussion about adhesions as well as radiation enteritis. Clearly we are trying to avoid any surgical intervention if possible. Interval History:  Patient returns today for continuing follow-up as scheduled. Since her last visit she's had no problems in the vulva. However, she has had 2 episodes of severe diarrhea and abdominal cramping. One was also was traveling in [place] and the other week later here in [place]. She managed both of these at home conservatively. She denies any nausea or vomiting associated with these episodes. She does note that she became more liberal with her diet while she was traveling eating salads and a broader  selection of food than she usually does at home.   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 She underwent a total abdominal hysterectomy and bilateral salpingo-oophorectomy, pelvic and para-aortic lymphadenectomy. She received postoperative whole pelvis radiation therapy.  Her other primary gynecologic problem has been lichen sclerosus managed with when necessary clobetasol.  Beginning in 2013  the patient had intermittent episodes of partial small bowel obstruction most likely secondary to radiation injury to the terminal ileum.  In December 2016 the patient was found to have a new primary squamous cell carcinoma the vulva undergoing a modified radical vulvectomy on 02/06/2015. She underwent a modified radical vulvectomy at [facility] on 02/06/2015. She was found to have a 3 cm vulvar lesion of the posterior vulva extending into the posterior vagina. A rhomboid flap was required to close the incision. Patient had on pelvic postoperative course. She reports she's been doing well. She irrigates the wound twice a day with a tepid water and a blow dryer. She denies any drainage or pain or fever. She has been walking approximate half mile a day.  Final pathology showed some close surgical margins. However, the specimen margins were toward the time of surgery and it is my impression that we were widely around the primary cancer. I discussed these findings with the patient and we've agreed to monitor her closely but not recommend any radiation therapy to the vulva. The patient's agreement with this recommendation.   Review of Systems:10 point review of systems is negative except as noted in interval history.   Vitals: There were no vitals taken for this visit.  Physical Exam: General : The patient is a healthy woman in no acute distress.  HEENT: normocephalic, extraoccular movements normal; neck is  supple without thyromegally  Lynphnodes: Supraclavicular and inguinal nodes not enlarged    Abdomen: Soft, non-tender, no ascites, no organomegally, no masses, no hernias  Pelvic:  EGBUS: Normal female mild lichen sclerosis on the anterior vulva.  The perineum is well-healed. There are no lesions.  Lower extremities: No edema or varicosities. Normal range of motion      Allergies  Allergen Reactions  . Codeine Other (See Comments)    Reporting amnesia  . Penicillins Hives, Rash and Other (See Comments)    Has patient had a PCN reaction causing immediate rash, facial/tongue/throat swelling, SOB or lightheadedness with hypotension: NO Has patient had a PCN reaction causing SEVERE RASH INVOLVING MUCUS MEMBRANES OR SKIN NECROSIS: YES Has patient had a PCN reaction that REQUIRED HOSPITALIZATION : YES Has patient had a PCN reaction occurring within the last 10 years: NO    . Prochlorperazine Edisylate Other (See Comments)    JAWS LOCKED EXTRAPYRAMIDAL Rx  . Fentanyl Other (See Comments)    hypotension  . Percocet [Oxycodone-Acetaminophen] Other (See Comments)    Made pt feel real funny, never wanting to take it again    Past Medical History:  Diagnosis Date  . 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 Asthma   . Cervical cancer ([facility]) 1997   poorly differentiated cervical carcinoma. S/p TAH and radiation x6 wks   . Complication of anesthesia    took 2 days to wake up after 1 surgery       pt cants use a adult cath,needs peds. cath due to past injury  . Constipation    very worried about this  . Endometrial cancer ([facility]) 1997   coincidental endometrial cancer also  . Hyperlipemia   . Hypertension   . Hypothyroidism   . Small bowel obstruction ([facility])    12/2008, repeat SBO in 02/2011 also with terminal ilietis   . Thyroid disease   . Vertebral body hemangioma    T12    Past Surgical History:  Procedure Laterality Date  . ABDOMINAL HYSTERECTOMY  Feb  1997  TAH/BSO, pelvic and periaortic lymphadenectomy   . BACK SURGERY    . LYMPH NODE DISSECTION Left 08/01/2015   Procedure: LYMPH NODE DISSECTION;  Surgeon: Melrose Nakayama, MD;  Location: [facility];  Service: Thoracic;  Laterality: Left;  . PARTIAL NEPHRECTOMY     right  . SEGMENTECOMY Left 08/01/2015   Procedure: Left Upper Lobe SEGMENTECTOMY;  Surgeon: Melrose Nakayama, MD;  Location: [facility];  Service: Thoracic;  Laterality: Left;  . SKIN BIOPSY    . TUBAL LIGATION    . TUMOR REMOVAL     meningioma  T 12  . VIDEO ASSISTED THORACOSCOPY (VATS)/WEDGE RESECTION Left 08/01/2015   Procedure: VIDEO ASSISTED THORACOSCOPY (VATS)/WEDGE RESECTION;  Surgeon: Melrose Nakayama, MD;  [facility];  Service: Thoracic;  Laterality: Left;    Current Outpatient Prescriptions  Medication Sig Dispense Refill  . acidophilus (RISAQUAD) CAPS Take 1 capsule by mouth daily.    Marland Kitchen albuterol (PROVENTIL HFA;VENTOLIN HFA) 108 (90 Base) MCG/ACT inhaler Inhale 2 puffs into the lungs every 6 (six) hours as needed for wheezing or shortness of breath. Reported on 06/29/2015    . benazepril-hydrochlorthiazide (LOTENSIN HCT) 20-12.5 MG per tablet Take 1 tablet by mouth daily.    . cholecalciferol (VITAMIN D) 1000 UNITS tablet Take 1,000 Units by mouth 2 (two) times daily.     Marland Kitchen dicyclomine (BENTYL) 20 MG tablet Take 1 tablet (20 mg total) by mouth every 6 (six) hours as needed for spasms (for abdominal cramping). 20 tablet 0  . docusate sodium (COLACE) 100 MG capsule Take 100 mg by mouth 2 (two) times daily.    Marland Kitchen levothyroxine (SYNTHROID, LEVOTHROID) 75 MCG tablet Take 75 mcg by mouth daily.    Marland Kitchen loratadine (CLARITIN) 10 MG tablet Take 10 mg by mouth daily as needed for allergies.    . magnesium gluconate (MAGONATE) 500 MG tablet Take 500 mg by mouth every morning.     . Multiple Vitamins-Minerals (MULTIVITAMIN WITH MINERALS) tablet Take 1 tablet by mouth daily.    Marland Kitchen omega-3 acid ethyl esters (LOVAZA) 1 G  capsule Take 1 g by mouth 2 (two) times daily.    . ondansetron (ZOFRAN ODT) 8 MG disintegrating tablet Take 1 tablet (8 mg total) by mouth every 8 (eight) hours as needed for nausea or vomiting. 10 tablet 0  . simvastatin (ZOCOR) 20 MG tablet Take 20 mg by mouth every evening.    . valACYclovir (VALTREX) 1000 MG tablet Take 1,000 mg by mouth 2 (two) times daily as needed (for 2 days for cold sores). Reported on 06/29/2015    . vitamin C (ASCORBIC ACID) 500 MG tablet Take 500 mg by mouth daily.     No current facility-administered medications for this visit.     Social History   Social History  . Marital status: Divorced    Spouse name: N/A  . Number of children: N/A  . Years of education: N/A   Occupational History  . Not on file.   Social History Main Topics  . Smoking status: Never Smoker  . Smokeless tobacco: Never Used  . Alcohol use No  . Drug use: No  . Sexual activity: No   Other Topics Concern  . Not on file   Social History Narrative   Lives at home alone, works out at [employer] 6 days a week, still very active. Has an ex-husband who she is in contact with. Son died of an MI and another son in [place].  Family History  Problem Relation Age of Onset  . Hypertension Other   . Diabetes Other   . Stroke Other   . CAD Other       Marti Sleigh, MD 09/24/2015, 10:55 AM

## 2015-09-27 ENCOUNTER — Telehealth: Payer: Self-pay | Admitting: Internal Medicine

## 2015-09-27 ENCOUNTER — Encounter: Payer: Self-pay | Admitting: Internal Medicine

## 2015-09-27 ENCOUNTER — Other Ambulatory Visit: Payer: Medicare Other

## 2015-09-27 ENCOUNTER — Ambulatory Visit (HOSPITAL_BASED_OUTPATIENT_CLINIC_OR_DEPARTMENT_OTHER): Payer: Medicare Other | Admitting: Internal Medicine

## 2015-09-27 ENCOUNTER — Ambulatory Visit: Payer: Medicare Other | Admitting: Physical Therapy

## 2015-09-27 VITALS — BP 172/72 | HR 62 | Temp 98.5°F | Resp 18 | Ht 62.25 in | Wt 152.0 lb

## 2015-09-27 DIAGNOSIS — C3412 Malignant neoplasm of upper lobe, left bronchus or lung: Secondary | ICD-10-CM | POA: Diagnosis not present

## 2015-09-27 DIAGNOSIS — E039 Hypothyroidism, unspecified: Secondary | ICD-10-CM

## 2015-09-27 DIAGNOSIS — I10 Essential (primary) hypertension: Secondary | ICD-10-CM

## 2015-09-27 NOTE — Telephone Encounter (Signed)
Gv pt appts for Feb 2018. Advised radiology will call to arrange scans prior to md.

## 2015-09-27 NOTE — Progress Notes (Signed)
[facility] [phone]   [phone] Multidisciplinary thoracic oncology clinic  CONSULT NOTE  REFERRING PHYSICIAN: Dr. Modesto Charon  REASON FOR CONSULTATION:  70 years old white female recently diagnosed with lung cancer.  HPI VIRGILENE Richards is a 70 y.o. 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 After her partial nephrectomy in 2011 the patient was complaining of fever and was found to have right pleural effusion at that time on CT scan of the chest. There was also questionable left upper lobe pulmonary nodule that was followed closely. In December 2016 the patient underwent valvular surgery and restaging PET scan on 01/30/2015 showed enlarging subsided nodule within the left upper lobe with mild/borderline malignant change FDG uptake suspicious for low-grade, indolent pulmonary neoplasm such as adenocarcinoma. The patient was seen by Dr. Lamonte Sakai and repeat CT scan of the chest without contrast on 07/12/2015 showed left upper lobe groundglass attenuating nodule measures 2.2 x 1.7 cm that had increased in size when compared with the study from 02/19/2011. There was also increasing central solid component measuring 0.9 x 0.3 cm and a stable solid right lower lobe pulmonary nodule measuring 0.4 cm. The patient was referred to Dr. Roxan Hockey and on 08/01/2015 she underwent left VATS, wedge resection and thoracoscopic lingular sparing left upper lobectomy with lymph node dissection under the care of Dr. Roxan Hockey. The final pathology (Accession: [id number]) showed well-differentiated adenocarcinoma spanning 2.5 cm. The resection margins were negative for carcinoma and no evidence of metastatic carcinoma and the dissected lymph nodes. There was no  evidence of lymphovascular or visceral pleural invasion. Dr. Roxan Hockey kindly referred the patient to me today for further evaluation and recommendation regarding treatment of her condition. When seen today the patient is feeling fine and has no complaints. She recovered very well from her surgery. She denied having any chest pain, shortness breath, cough or hemoptysis. She denied having any significant weight loss or night sweats. She has no nausea, vomiting, diarrhea or constipation. She has no headache or visual changes. Family history significant for mother died from stroke at age 56, father died from multiple heart attacks and sister has a history of kidney cancer and diabetes mellitus. She also has a son who died from massive heart attack at age 43. The patient is divorced and has 1 living son in last [place]. She used to work as a Education officer, museum. She has no history of smoking, alcohol or drug abuse.  HPI  Past Medical History:  Diagnosis Date  . 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 Asthma   . Cervical cancer ([name]) 1997   poorly differentiated cervical carcinoma. S/p TAH and radiation x6 wks   . Complication of anesthesia    took 2 days to wake up after 1 surgery       pt cants use a adult cath,needs peds. cath due to past injury  . Constipation    very worried about this  . Endometrial cancer ([place]) 1997   coincidental endometrial cancer also  . Hyperlipemia   . Hypertension   . Hypothyroidism   . Small bowel obstruction ([place])    12/2008, repeat SBO in 02/2011 also with terminal ilietis   . Thyroid disease   . Vertebral body hemangioma    T12    Past Surgical History:  Procedure Laterality Date  . ABDOMINAL HYSTERECTOMY  Feb 1997   TAH/BSO, pelvic and periaortic lymphadenectomy   . BACK SURGERY    . LYMPH NODE DISSECTION Left 08/01/2015   Procedure: LYMPH NODE DISSECTION;  Surgeon: Melrose Nakayama, MD;  Location: [facility];  Service: Thoracic;   Laterality: Left;  . PARTIAL NEPHRECTOMY     right  . SEGMENTECOMY Left 08/01/2015   Procedure: Left Upper Lobe SEGMENTECTOMY;  Surgeon: Melrose Nakayama, MD;  Location: [facility];  Service: Thoracic;  Laterality: Left;  . SKIN BIOPSY    . TUBAL LIGATION    . TUMOR REMOVAL     meningioma  T 12  . VIDEO ASSISTED THORACOSCOPY (VATS)/WEDGE RESECTION Left 08/01/2015   Procedure: VIDEO ASSISTED THORACOSCOPY (VATS)/WEDGE RESECTION;  Surgeon: Melrose Nakayama, MD;  Location: [facility];  Service: Thoracic;  Laterality: Left;    Family History  Problem Relation Age of Onset  . Hypertension Other   . Diabetes Other   . Stroke Other   . CAD Other     Social History Social History  Substance Use Topics  . Smoking status: Never Smoker  . Smokeless tobacco: Never Used  . Alcohol use No    Allergies  Allergen Reactions  . Codeine Other (See Comments)    Other reaction(s): amnesia Reporting amnesia  . Penicillins Hives, Rash and Other (See Comments)    Has patient had a PCN reaction causing immediate rash, facial/tongue/throat swelling, SOB or lightheadedness with hypotension: NO Has patient had a PCN reaction causing SEVERE RASH INVOLVING MUCUS MEMBRANES OR SKIN NECROSIS: YES Has patient had a PCN reaction that REQUIRED HOSPITALIZATION : patient denies hospitalization Has patient had a PCN reaction occurring within the last 10 years: NO    . Prochlorperazine Edisylate Other (See Comments)    JAWS LOCKED EXTRAPYRAMIDAL Rx  . Fentanyl Other (See Comments)    Other reaction(s): low blood pressure hypotension  . Percocet [Oxycodone-Acetaminophen] Other (See Comments)    Made pt feel real funny, never wanting to take it again; near syncopal episode  . Prochlorperazine     Other reaction(s): extrapyramidal reaction Other reaction(s): extrapyramidal reaction  . Penicillin G Sodium Rash    Other reaction(s): rash    Current Outpatient Prescriptions  Medication Sig Dispense Refill  .  acidophilus (RISAQUAD) CAPS Take 1 capsule by mouth daily.    Marland Kitchen albuterol (PROVENTIL HFA;VENTOLIN HFA) 108 (90 Base) MCG/ACT inhaler Inhale 2 puffs into the lungs every 6 (six) hours as needed for wheezing or shortness of breath. Reported on 06/29/2015    . benazepril-hydrochlorthiazide (LOTENSIN HCT) 20-12.5 MG per tablet Take 1 tablet by mouth daily.    . Biotin 10 MG TABS 1 tablet    . cholecalciferol (VITAMIN D) 1000 UNITS tablet Take 1,000 Units by mouth 2 (two) times daily.     . clobetasol ointment (TEMOVATE) 0.05 % Apply topically.    . dicyclomine (BENTYL) 20 MG tablet Take 1 tablet (20 mg total) by mouth every 6 (six) hours as needed for spasms (for abdominal cramping). 20 tablet 0  . docusate sodium (COLACE) 100 MG capsule Take 100 mg by mouth 2 (two) times daily.    Marland Kitchen levothyroxine (SYNTHROID, LEVOTHROID) 75 MCG tablet Take 75 mcg by mouth daily.    Marland Kitchen lidocaine (XYLOCAINE) 2 % jelly Apply to affected area as needed daily    . loratadine (CLARITIN) 10 MG tablet Take 10 mg by mouth daily as needed for allergies.    . magnesium  gluconate (MAGONATE) 500 MG tablet Take 500 mg by mouth every morning.     . Multiple Vitamins-Minerals (MULTIVITAMIN WITH MINERALS) tablet Take 1 tablet by mouth daily.    . Oil of Oregano 1500 MG CAPS Take by mouth.    . omega-3 acid ethyl esters (LOVAZA) 1 G capsule Take 1 g by mouth 2 (two) times daily.    . Omega-3 Fatty Acids (FISH OIL) 1000 MG CAPS 1 tablet    . ondansetron (ZOFRAN ODT) 8 MG disintegrating tablet Take 1 tablet (8 mg total) by mouth every 8 (eight) hours as needed for nausea or vomiting. 10 tablet 0  . simvastatin (ZOCOR) 20 MG tablet Take 20 mg by mouth every evening.    . valACYclovir (VALTREX) 1000 MG tablet Take 1,000 mg by mouth 2 (two) times daily as needed (for 2 days for cold sores). Reported on 06/29/2015    . vitamin C (ASCORBIC ACID) 500 MG tablet Take 500 mg by mouth daily.    . Zinc Oxide 40 % PSTE Apply topically.     No  current facility-administered medications for this visit.     Review of Systems  Constitutional: negative Eyes: negative Ears, nose, mouth, throat, and face: negative Respiratory: negative Cardiovascular: negative Gastrointestinal: negative Genitourinary:negative Integument/breast: negative Hematologic/lymphatic: negative Musculoskeletal:negative Neurological: negative Behavioral/Psych: negative Endocrine: negative Allergic/Immunologic: negative  Physical Exam  LNL:GXQJJ, healthy, no distress, well nourished and well developed SKIN: skin color, texture, turgor are normal, no rashes or significant lesions HEAD: Normocephalic, No masses, lesions, tenderness or abnormalities EYES: normal, PERRLA, Conjunctiva are pink and non-injected EARS: External ears normal, Canals clear OROPHARYNX:no exudate, no erythema and lips, buccal mucosa, and tongue normal  NECK: supple, no adenopathy, no JVD LYMPH:  no palpable lymphadenopathy, no hepatosplenomegaly BREAST:not examined LUNGS: clear to auscultation , and palpation HEART: regular rate & rhythm, no murmurs and no gallops ABDOMEN:abdomen soft, non-tender, normal bowel sounds and no masses or organomegaly BACK: Back symmetric, no curvature., No CVA tenderness EXTREMITIES:no joint deformities, effusion, or inflammation, no edema, no skin discoloration  NEURO: alert & oriented x 3 with fluent speech, no focal motor/sensory deficits  PERFORMANCE STATUS: ECOG 1  LABORATORY DATA: Lab Results  Component Value Date   WBC 8.3 08/05/2015   HGB 9.7 (L) 08/05/2015   HCT 30.4 (L) 08/05/2015   MCV 65.8 (L) 08/05/2015   PLT 188 08/05/2015      Chemistry      Component Value Date/Time   NA 135 08/06/2015 1146   K 3.5 08/06/2015 1146   CL 98 (L) 08/06/2015 1146   CO2 28 08/06/2015 1146   BUN 7 08/06/2015 1146   CREATININE 0.68 08/06/2015 1146      Component Value Date/Time   CALCIUM 8.8 (L) 08/06/2015 1146   ALKPHOS 64 08/03/2015  0640   AST 23 08/03/2015 0640   ALT 21 08/03/2015 0640   BILITOT 0.7 08/03/2015 0640       RADIOGRAPHIC STUDIES: No results found.  ASSESSMENT:This is a very pleasant 70 years old white female recently diagnosed with a stage IA (T1b, N0, M0) non-small cell lung cancer, adenocarcinoma presented with left upper lobe pulmonary nodule status post lingula sparing left upper lobectomy with lymph node dissection.   PLAN: I had a lengthy discussion with the patient about her current disease stage, prognosis and treatment options. I explained to the patient that there is no survival benefit for adjuvant systemic chemotherapy or radiation to patient with a stage IA after surgical resection. I  explained to the patient that the current standard of care is observation and close monitoring. I will arrange for the patient to come back for follow-up visit in 6 months with repeat CT scan of the chest for restaging of her disease. The patient has no neurological symptoms and I will not order MRI of the brain unless she has concerning symptoms. The patient was seen during the multidisciplinary thoracic oncology clinic today by medical oncology, thoracic navigator, social worker and physical therapist.  She was advised to call immediately if she has any concerning symptoms in the interval. The patient voices understanding of current disease status and treatment options and is in agreement with the current care plan.  All questions were answered. The patient knows to call the clinic with any problems, questions or concerns. We can certainly see the patient much sooner if necessary.  Thank you so much for allowing me to participate in the care of [name] Richards. I will continue to follow up the patient with you and assist in her care.  I spent 40 minutes counseling the patient face to face. The total time spent in the appointment was 60 minutes.  Disclaimer: This note was dictated with voice recognition  software. Similar sounding words can inadvertently be transcribed and may not be corrected upon review.   Garland Hincapie K. September 27, 2015, 2:03 PM

## 2015-10-22 ENCOUNTER — Other Ambulatory Visit: Payer: Self-pay | Admitting: Thoracic Surgery (Cardiothoracic Vascular Surgery)

## 2015-10-22 DIAGNOSIS — C3490 Malignant neoplasm of unspecified part of unspecified bronchus or lung: Secondary | ICD-10-CM

## 2015-10-23 ENCOUNTER — Ambulatory Visit: Payer: Self-pay | Admitting: Thoracic Surgery (Cardiothoracic Vascular Surgery)

## 2015-10-25 ENCOUNTER — Telehealth: Payer: Self-pay | Admitting: *Deleted

## 2015-10-25 NOTE — Telephone Encounter (Signed)
Oncology Nurse Navigator Documentation  Oncology Nurse Navigator Flowsheets 10/25/2015  Navigator Encounter Type Telephone/I called to follow up to see how she was doing.  I was unable to reach.  I left a vm message with my name and phone number to call if needed.   Telephone Outgoing Call  Abnormal Finding Date 07/12/2015  Confirmed Diagnosis Date 08/01/2015  Surgery Date 08/01/2015  Treatment Initiated Date 08/01/2015  Treatment Phase Post-Tx Follow-up  Acuity Level 1  Time Spent with Patient 15

## 2015-11-06 ENCOUNTER — Encounter: Payer: Self-pay | Admitting: Thoracic Surgery (Cardiothoracic Vascular Surgery)

## 2015-11-06 ENCOUNTER — Ambulatory Visit (INDEPENDENT_AMBULATORY_CARE_PROVIDER_SITE_OTHER): Payer: Medicare Other | Admitting: Thoracic Surgery (Cardiothoracic Vascular Surgery)

## 2015-11-06 ENCOUNTER — Ambulatory Visit
Admission: RE | Admit: 2015-11-06 | Discharge: 2015-11-06 | Disposition: A | Discharge status: Home / Self Care | Payer: Medicare Other | Source: Ambulatory Visit | Attending: Thoracic Surgery (Cardiothoracic Vascular Surgery) | Admitting: Thoracic Surgery (Cardiothoracic Vascular Surgery)

## 2015-11-06 VITALS — BP 149/80 | HR 75 | Resp 16 | Ht 62.25 in | Wt 147.0 lb

## 2015-11-06 DIAGNOSIS — Z902 Acquired absence of lung [part of]: Secondary | ICD-10-CM | POA: Diagnosis not present

## 2015-11-06 DIAGNOSIS — C3490 Malignant neoplasm of unspecified part of unspecified bronchus or lung: Secondary | ICD-10-CM

## 2015-11-06 DIAGNOSIS — C3412 Malignant neoplasm of upper lobe, left bronchus or lung: Secondary | ICD-10-CM | POA: Diagnosis not present

## 2015-11-06 NOTE — Progress Notes (Signed)
[address]       [place], [address]             [phone]       HPI: [name] Richards returns today for scheduled follow-up visit.  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 She had a CT as part of her staging for that and was found to have a groundglass nodule in the left upper lobe. She is a lifelong nonsmoker. I did a lingular sparing left upper lobectomy on 08/01/2015. Her postoperative course was done, complicated.  I last saw her in the office in July. She was doing well at that time and not having any significant pain.  She saw Dr. Julien Nordmann. He did not recommend adjuvant therapy given that she had stage IA disease. He is scheduled to see her back in follow-up with a CT scan in February.  She feels well. She is able to do more on the elliptical machine and she was doing preoperatively. She is not having any pain. She denies shortness of breath. Her appetite is good. She has not had any unusual headaches or visual changes.  Past Medical History:  Diagnosis Date  . 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 Asthma   . Cervical cancer ([place]) 1997   poorly differentiated cervical carcinoma. S/p TAH and radiation x6 wks   . Complication of anesthesia    took 2 days to wake up after 1 surgery       pt cants use a adult cath,needs peds. cath due to past injury  . Constipation    very worried about this  . Endometrial cancer ([place]) 1997   coincidental endometrial cancer also  . Hyperlipemia   . Hypertension   . Hypothyroidism   . Primary cancer of left upper lobe of lung ([place]) 08/01/2015  . Small bowel obstruction ([place])    12/2008, repeat SBO in 02/2011 also with terminal ilietis   . Thyroid disease   . Vertebral body hemangioma    T12     Current Outpatient Prescriptions  Medication Sig Dispense Refill  . acidophilus (RISAQUAD) CAPS Take 1 capsule by mouth daily.    Marland Kitchen albuterol (PROVENTIL HFA;VENTOLIN HFA) 108 (90  Base) MCG/ACT inhaler Inhale 2 puffs into the lungs every 6 (six) hours as needed for wheezing or shortness of breath. Reported on 06/29/2015    . benazepril-hydrochlorthiazide (LOTENSIN HCT) 20-12.5 MG per tablet Take 1 tablet by mouth daily.    . Biotin 10 MG TABS 1 tablet    . calcium elemental as carbonate (TUMS ULTRA 1000) 400 MG chewable tablet 1 tablet    . cholecalciferol (VITAMIN D) 1000 UNITS tablet Take 1,000 Units by mouth 2 (two) times daily.     . clobetasol ointment (TEMOVATE) 0.05 % Apply topically daily as needed.     . dicyclomine (BENTYL) 20 MG tablet Take 1 tablet (20 mg total) by mouth every 6 (six) hours as needed for spasms (for abdominal cramping). 20 tablet 0  . docusate sodium (COLACE) 100 MG capsule Take 100 mg by mouth 2 (two) times daily.    Marland Kitchen levothyroxine (SYNTHROID, LEVOTHROID) 75 MCG tablet Take 75 mcg by mouth daily.    Marland Kitchen loratadine (CLARITIN) 10 MG tablet Take 10 mg by mouth daily as needed for allergies.    . Magnesium 300 MG CAPS 1 capsule with a meal    . Multiple Vitamin (MULTI-VITAMINS) TABS  as directed    . Omega-3 Fatty Acids (FISH OIL) 1000 MG CAPS 1 tablet    . ondansetron (ZOFRAN ODT) 8 MG disintegrating tablet Take 1 tablet (8 mg total) by mouth every 8 (eight) hours as needed for nausea or vomiting. 10 tablet 0  . simvastatin (ZOCOR) 20 MG tablet Take 20 mg by mouth every evening.    . valACYclovir (VALTREX) 1000 MG tablet Take 1,000 mg by mouth 2 (two) times daily as needed (for 2 days for cold sores). Reported on 06/29/2015    . vitamin C (ASCORBIC ACID) 500 MG tablet Take 500 mg by mouth daily.     No current facility-administered medications for this visit.     Physical Exam BP (!) 149/80   Pulse 75   Resp 16   Ht 5' 2.25" (1.581 m)   Wt 147 lb (66.7 kg)   SpO2 98% Comment: ON RA  BMI 26.60 kg/m  70 year old woman in no acute distress Alert and oriented 3 with no focal deficits Lungs clear essentially equal breath sounds  bilaterally Incisions well healed Cardiac regular rate and rhythm normal S1 and S2  Diagnostic Tests: CHEST  2 VIEW  COMPARISON:  08/21/2015  FINDINGS: The heart size and mediastinal contours are within normal limits. Postsurgical changes and volume loss within the left hemi thorax are identified. The appearance is stable when compared with previous exam.Both lungs are clear. The visualized skeletal structures are unremarkable.  IMPRESSION: No active cardiopulmonary disease.   Electronically Signed   By: Kerby Moors M.D.   On: 11/06/2015 16:23  I personally reviewed the chest x-ray and concur with the findings noted above.  Impression: [name] Richards is a 70 year old woman who is now 3 months out from a minimally invasive lingular sparing left upper lobectomy. She is doing extremely well. She does not have any incisional pain. She has not noticed any decrease in her respiratory capacity. She is exercising on a regular basis.  She has stage IA disease and does not require any adjuvant therapy.  She has an appointment to see Dr. Julien Nordmann have a CT scan in February. She'll then be due for another one in August. I will see her back after that scan for her one-year follow-up visit.    Melrose Nakayama, MD [facility] [phone]

## 2015-12-28 ENCOUNTER — Encounter: Payer: Self-pay | Admitting: Gynecology

## 2015-12-28 ENCOUNTER — Ambulatory Visit: Payer: Medicare Other | Attending: Gynecology | Admitting: Gynecology

## 2015-12-28 VITALS — BP 154/65 | HR 61 | Temp 97.6°F | Resp 18 | Ht 62.25 in

## 2015-12-28 DIAGNOSIS — Z8544 Personal history of malignant neoplasm of other female genital organs: Secondary | ICD-10-CM | POA: Insufficient documentation

## 2015-12-28 DIAGNOSIS — C519 Malignant neoplasm of vulva, unspecified: Secondary | ICD-10-CM

## 2015-12-28 DIAGNOSIS — Z8541 Personal history of malignant neoplasm of cervix uteri: Secondary | ICD-10-CM | POA: Diagnosis not present

## 2015-12-28 DIAGNOSIS — E785 Hyperlipidemia, unspecified: Secondary | ICD-10-CM | POA: Diagnosis not present

## 2015-12-28 DIAGNOSIS — D1809 Hemangioma of other sites: Secondary | ICD-10-CM | POA: Diagnosis not present

## 2015-12-28 DIAGNOSIS — E039 Hypothyroidism, unspecified: Secondary | ICD-10-CM | POA: Insufficient documentation

## 2015-12-28 DIAGNOSIS — Z08 Encounter for follow-up examination after completed treatment for malignant neoplasm: Secondary | ICD-10-CM | POA: Diagnosis not present

## 2015-12-28 DIAGNOSIS — Z8542 Personal history of malignant neoplasm of other parts of uterus: Secondary | ICD-10-CM

## 2015-12-28 DIAGNOSIS — I10 Essential (primary) hypertension: Secondary | ICD-10-CM | POA: Diagnosis not present

## 2015-12-28 NOTE — Patient Instructions (Signed)
Doing well.  Plan to follow up in three months time or sooner if needed.  Please call for any questions or concerns.

## 2015-12-28 NOTE — Progress Notes (Signed)
Consult Note: Gyn-Onc   [name] Richards 70 y.o. female  Chief Complaint  Patient presents with  . 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 No evidence of disease.   Marland KitchenPast history of Poorly differentiated endometrial and cervical cancer 1997. No evidence of disease.  Intermittent partial small bowel obstruction(Stable over the last 3 months). 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 She'll call she has any new symptoms or physical findings.  She's encouraged to stick with her modified diet as that seems to be working well.  . Interval History:  Patient returns today for continuing follow-up as scheduled. Since her last visit she's had no problems in the vulva.    She has been more vigilant with regard to her dietary modifications and is had no subsequent episodes of small bowel obstruction over the past 3 months.. She just returned from a cruise through [place]. She continues to work out consistently at [employer]. 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 She underwent a total abdominal hysterectomy and bilateral salpingo-oophorectomy, pelvic and para-aortic lymphadenectomy. She received postoperative whole pelvis radiation therapy.  Her other primary gynecologic problem has been lichen sclerosus managed with when necessary clobetasol.  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  In December 2016 the patient was found to have a new primary squamous cell carcinoma the vulva undergoing a modified radical vulvectomy on 02/06/2015. She underwent a modified radical vulvectomy at [facility]  on 02/06/2015. She was found to have a 3 cm vulvar lesion of the posterior vulva extending into the posterior vagina. A rhomboid flap was required to close the incision. Patient had on pelvic postoperative course. She reports she's been doing well. She irrigates the wound twice a day with a tepid water and a blow dryer. She denies any drainage or pain or fever. She has been walking approximate half mile a day.  Final pathology showed some close surgical margins. However, the specimen margins were toward the time of surgery and it is my impression that we were widely around the primary cancer. I discussed these findings with the patient and we've agreed to monitor her closely but not recommend any radiation therapy to the vulva. The patient's agreement with this recommendation.   Review of Systems:10 point review of systems is negative except as noted in interval history.   Vitals: There were no vitals taken for this visit.  Physical Exam: General : The patient is a healthy woman in no acute distress.  HEENT: normocephalic, extraoccular movements normal; neck is supple without thyromegally  Lynphnodes: Supraclavicular and inguinal nodes not enlarged    Abdomen: Soft, non-tender, no ascites, no organomegally, no masses, no hernias  Pelvic:  EGBUS: Normal female mild lichen sclerosis on the anterior vulva.  The perineum is well-healed. There are no lesions.  Lower extremities: No edema or varicosities. Normal range of motion      Allergies  Allergen Reactions  . Codeine Other (See Comments)    Other reaction(s): amnesia Reporting amnesia  . Penicillins Hives, Rash and Other (See Comments)    Has patient had a PCN reaction causing immediate rash, facial/tongue/throat swelling, SOB or lightheadedness  with hypotension: NO Has patient had a PCN reaction causing SEVERE RASH INVOLVING MUCUS MEMBRANES OR SKIN NECROSIS: YES Has patient had a PCN reaction that REQUIRED HOSPITALIZATION : patient  denies hospitalization Has patient had a PCN reaction occurring within the last 10 years: NO    . Prochlorperazine Edisylate Other (See Comments)    JAWS LOCKED EXTRAPYRAMIDAL Rx  . Fentanyl Other (See Comments)    Other reaction(s): low blood pressure hypotension  . Percocet [Oxycodone-Acetaminophen] Other (See Comments)    Made pt feel real funny, never wanting to take it again; near syncopal episode  . Prochlorperazine     Other reaction(s): extrapyramidal reaction Other reaction(s): extrapyramidal reaction  . Penicillin G Sodium Rash    Other reaction(s): rash    Past Medical History:  Diagnosis Date  . 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 Asthma   . Cervical cancer ([place]) 1997   poorly differentiated cervical carcinoma. S/p TAH and radiation x6 wks   . Complication of anesthesia    took 2 days to wake up after 1 surgery       pt cants use a adult cath,needs peds. cath due to past injury  . Constipation    very worried about this  . Endometrial cancer ([place]) 1997   coincidental endometrial cancer also  . Hyperlipemia   . Hypertension   . Hypothyroidism   . Primary cancer of left upper lobe of lung ([place]) 08/01/2015  . Small bowel obstruction    12/2008, repeat SBO in 02/2011 also with terminal ilietis   . Thyroid disease   . Vertebral body hemangioma    T12    Past Surgical History:  Procedure Laterality Date  . ABDOMINAL HYSTERECTOMY  Feb 1997   TAH/BSO, pelvic and periaortic lymphadenectomy   . BACK SURGERY    . LYMPH NODE DISSECTION Left 08/01/2015   Procedure: LYMPH NODE DISSECTION;  Surgeon: Melrose Nakayama, MD;  Location: [facility];  Service: Thoracic;  Laterality: Left;  . PARTIAL NEPHRECTOMY     right  . SEGMENTECOMY Left 08/01/2015   Procedure: Left Upper Lobe SEGMENTECTOMY;  Surgeon: Melrose Nakayama, MD;  Location: [facility];  Service: Thoracic;  Laterality: Left;  . SKIN BIOPSY    . TUBAL LIGATION    . TUMOR REMOVAL      meningioma  T 12  . VIDEO ASSISTED THORACOSCOPY (VATS)/WEDGE RESECTION Left 08/01/2015   Procedure: VIDEO ASSISTED THORACOSCOPY (VATS)/WEDGE RESECTION;  Surgeon: Melrose Nakayama, MD;  [facility];  Service: Thoracic;  Laterality: Left;    Current Outpatient Prescriptions  Medication Sig Dispense Refill  . acidophilus (RISAQUAD) CAPS Take 1 capsule by mouth daily.    . benazepril-hydrochlorthiazide (LOTENSIN HCT) 20-12.5 MG per tablet Take 1 tablet by mouth daily.    . Biotin 10 MG TABS 1 tablet    . calcium elemental as carbonate (TUMS ULTRA 1000) 400 MG chewable tablet 1 tablet    . cholecalciferol (VITAMIN D) 1000 UNITS tablet Take 1,000 Units by mouth 2 (two) times daily.     Marland Kitchen dicyclomine (BENTYL) 20 MG tablet Take 1 tablet (20 mg total) by mouth every 6 (six) hours as needed for spasms (for abdominal cramping). 20 tablet 0  . docusate sodium (COLACE) 100 MG capsule Take 100 mg by mouth 2 (two) times daily.    Marland Kitchen levothyroxine (SYNTHROID, LEVOTHROID) 75 MCG tablet Take 75 mcg by mouth daily.    Marland Kitchen loratadine (CLARITIN) 10 MG tablet  Take 10 mg by mouth daily as needed for allergies.    . Magnesium 300 MG CAPS 1 capsule with a meal    . Multiple Vitamin (MULTI-VITAMINS) TABS as directed    . Omega-3 Fatty Acids (FISH OIL) 1000 MG CAPS 1 tablet    . simvastatin (ZOCOR) 20 MG tablet Take 20 mg by mouth every evening.    . valACYclovir (VALTREX) 1000 MG tablet Take 1,000 mg by mouth 2 (two) times daily as needed (for 2 days for cold sores). Reported on 06/29/2015    . vitamin C (ASCORBIC ACID) 500 MG tablet Take 500 mg by mouth daily.    Marland Kitchen albuterol (PROVENTIL HFA;VENTOLIN HFA) 108 (90 Base) MCG/ACT inhaler Inhale 2 puffs into the lungs every 6 (six) hours as needed for wheezing or shortness of breath. Reported on 06/29/2015    . clobetasol ointment (TEMOVATE) 0.05 % Apply topically daily as needed.     . ondansetron (ZOFRAN ODT) 8 MG disintegrating tablet Take 1 tablet (8 mg total) by  mouth every 8 (eight) hours as needed for nausea or vomiting. (Patient not taking: Reported on 12/28/2015) 10 tablet 0   No current facility-administered medications for this visit.     Social History   Social History  . Marital status: Divorced    Spouse name: N/A  . Number of children: N/A  . Years of education: N/A   Occupational History  . Not on file.   Social History Main Topics  . Smoking status: Never Smoker  . Smokeless tobacco: Never Used  . Alcohol use No  . Drug use: No  . Sexual activity: No   Other Topics Concern  . Not on file   Social History Narrative   Lives at home alone, works out at [employer] 6 days a week, still very active. Has an ex-husband who she is in contact with. Son died of an MI and another son in [place].     Family History  Problem Relation Age of Onset  . Hypertension Other   . Diabetes Other   . Stroke Other   . CAD Other       Marti Sleigh, MD 12/28/2015, 12:08 PM       Consult Note: Gyn-Onc   [name] Richards 70 y.o. female  Chief Complaint  Patient presents with  . 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 No evidence of disease.  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  Past history of Poorly differentiated endometrial and cervical cancer 1997. No evidence of disease.  Intermittent partial small bowel obstruction. Lichen sclerosis of the vulva (stable)  Plan:  Patient returns seem me in 3 months. She'll call she has any new symptoms or physical findings. She's encouraged to reduce the amount of fiber in her diet. Also she finds that there are particular foods cause diarrhea she should avoid them. We had a lengthy discussion about adhesions as well as radiation enteritis. Clearly we are trying to avoid any surgical intervention if possible. Interval History:  Patient returns today for continuing follow-up as scheduled.  Since her last visit she's had no problems in the vulva. However, she has had 2 episodes of severe diarrhea and abdominal cramping. One was also was traveling in [place] and the other week later here in [place]. She managed both of these at home conservatively. She denies any nausea or vomiting associated with these episodes. She  does note that she became more liberal with her diet while she was traveling eating salads and a broader selection of food than she usually does at home.   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 She underwent a total abdominal hysterectomy and bilateral salpingo-oophorectomy, pelvic and para-aortic lymphadenectomy. She received postoperative whole pelvis radiation therapy.  Her other primary gynecologic problem has been lichen sclerosus managed with when necessary clobetasol.  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  In December 2016 the patient was found to have a new primary squamous cell carcinoma the vulva undergoing a modified radical vulvectomy on 02/06/2015. She underwent a modified radical vul[facility] at [facility] on 02/06/2015. She was found to have a 3 cm vulvar lesion of the posterior vulva extending into the posterior vagina. A rhomboid flap was required to close the incision. Patient had on pelvic postoperative course. She reports she's been doing well. She irrigates the wound twice a day with a tepid water and a blow dryer. She denies any drainage or pain or fever. She has been walking approximate half mile a day.  Final pathology showed some close surgical margins. However, the specimen margins were toward the time of surgery and it is my impression that we were widely around the primary cancer. I discussed these findings with the patient  and we've agreed to monitor her closely but not recommend any radiation therapy to the vulva. The patient's agreement with this recommendation.   Review of Systems:10 point review of systems is negative except as noted in interval history.   Vitals: There were no vitals taken for this visit.  Physical Exam: General : The patient is a healthy woman in no acute distress.  HEENT: normocephalic, extraoccular movements normal; neck is supple without thyromegally  Lynphnodes: Supraclavicular and inguinal nodes not enlarged    Abdomen: Soft, non-tender, no ascites, no organomegally, no masses, no hernias  Pelvic:  EGBUS: Normal female mild lichen sclerosis on the anterior vulva.  The perineum is well-healed. There are no lesions.  Lower extremities: No edema or varicosities. Normal range of motion      Allergies  Allergen Reactions  . Codeine Other (See Comments)    Other reaction(s): amnesia Reporting amnesia  . Penicillins Hives, Rash and Other (See Comments)    Has patient had a PCN reaction causing immediate rash, facial/tongue/throat swelling, SOB or lightheadedness with hypotension: NO Has patient had a PCN reaction causing SEVERE RASH INVOLVING MUCUS MEMBRANES OR SKIN NECROSIS: YES Has patient had a PCN reaction that REQUIRED HOSPITALIZATION : patient denies hospitalization Has patient had a PCN reaction occurring within the last 10 years: NO    . Prochlorperazine Edisylate Other (See Comments)    JAWS LOCKED EXTRAPYRAMIDAL Rx  . Fentanyl Other (See Comments)    Other reaction(s): low blood pressure hypotension  . Percocet [Oxycodone-Acetaminophen] Other (See Comments)    Made pt feel real funny, never wanting to take it again; near syncopal episode  . Prochlorperazine     Other reaction(s): extrapyramidal reaction Other reaction(s): extrapyramidal reaction  . Penicillin G Sodium Rash    Other reaction(s): rash    Past Medical History:  Diagnosis Date  .  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 Asthma   . Cervical cancer ([place]) 1997   poorly differentiated cervical carcinoma. S/p  TAH and radiation x6 wks   . Complication of anesthesia    took 2 days to wake up after 1 surgery       pt cants use a adult cath,needs peds. cath due to past injury  . Constipation    very worried about this  . Endometrial cancer ([place]) 1997   coincidental endometrial cancer also  . Hyperlipemia   . Hypertension   . Hypothyroidism   . Primary cancer of left upper lobe of lung ([place]) 08/01/2015  . Small bowel obstruction    12/2008, repeat SBO in 02/2011 also with terminal ilietis   . Thyroid disease   . Vertebral body hemangioma    T12    Past Surgical History:  Procedure Laterality Date  . ABDOMINAL HYSTERECTOMY  Feb 1997   TAH/BSO, pelvic and periaortic lymphadenectomy   . BACK SURGERY    . LYMPH NODE DISSECTION Left 08/01/2015   Procedure: LYMPH NODE DISSECTION;  Surgeon: Melrose Nakayama, MD;  Location: [facility];  Service: Thoracic;  Laterality: Left;  . PARTIAL NEPHRECTOMY     right  . SEGMENTECOMY Left 08/01/2015   Procedure: Left Upper Lobe SEGMENTECTOMY;  Surgeon: Melrose Nakayama, MD;  Location: [facility];  Service: Thoracic;  Laterality: Left;  . SKIN BIOPSY    . TUBAL LIGATION    . TUMOR REMOVAL     meningioma  T 12  . VIDEO ASSISTED THORACOSCOPY (VATS)/WEDGE RESECTION Left 08/01/2015   Procedure: VIDEO ASSISTED THORACOSCOPY (VATS)/WEDGE RESECTION;  Surgeon: Melrose Nakayama, MD;  [facility];  Service: Thoracic;  Laterality: Left;    Current Outpatient Prescriptions  Medication Sig Dispense Refill  . acidophilus (RISAQUAD) CAPS Take 1 capsule by mouth daily.    . benazepril-hydrochlorthiazide (LOTENSIN HCT) 20-12.5 MG per tablet Take 1 tablet by mouth daily.    . Biotin 10 MG TABS 1 tablet    . calcium elemental as carbonate (TUMS ULTRA 1000) 400 MG chewable tablet 1 tablet    . cholecalciferol (VITAMIN D)  1000 UNITS tablet Take 1,000 Units by mouth 2 (two) times daily.     Marland Kitchen dicyclomine (BENTYL) 20 MG tablet Take 1 tablet (20 mg total) by mouth every 6 (six) hours as needed for spasms (for abdominal cramping). 20 tablet 0  . docusate sodium (COLACE) 100 MG capsule Take 100 mg by mouth 2 (two) times daily.    Marland Kitchen levothyroxine (SYNTHROID, LEVOTHROID) 75 MCG tablet Take 75 mcg by mouth daily.    Marland Kitchen loratadine (CLARITIN) 10 MG tablet Take 10 mg by mouth daily as needed for allergies.    . Magnesium 300 MG CAPS 1 capsule with a meal    . Multiple Vitamin (MULTI-VITAMINS) TABS as directed    . Omega-3 Fatty Acids (FISH OIL) 1000 MG CAPS 1 tablet    . simvastatin (ZOCOR) 20 MG tablet Take 20 mg by mouth every evening.    . valACYclovir (VALTREX) 1000 MG tablet Take 1,000 mg by mouth 2 (two) times daily as needed (for 2 days for cold sores). Reported on 06/29/2015    . vitamin C (ASCORBIC ACID) 500 MG tablet Take 500 mg by mouth daily.    Marland Kitchen albuterol (PROVENTIL HFA;VENTOLIN HFA) 108 (90 Base) MCG/ACT inhaler Inhale 2 puffs into the lungs every 6 (six) hours as needed for wheezing or shortness of breath. Reported on 06/29/2015    . clobetasol ointment (TEMOVATE) 0.05 % Apply topically daily as needed.     . ondansetron (ZOFRAN ODT) 8 MG  disintegrating tablet Take 1 tablet (8 mg total) by mouth every 8 (eight) hours as needed for nausea or vomiting. (Patient not taking: Reported on 12/28/2015) 10 tablet 0   No current facility-administered medications for this visit.     Social History   Social History  . Marital status: Divorced    Spouse name: N/A  . Number of children: N/A  . Years of education: N/A   Occupational History  . Not on file.   Social History Main Topics  . Smoking status: Never Smoker  . Smokeless tobacco: Never Used  . Alcohol use No  . Drug use: No  . Sexual activity: No   Other Topics Concern  . Not on file   Social History Narrative   Lives at home alone, works out at [employer] 6 days a week, still very active. Has an ex-husband who she is in contact with. Son died of an MI and another son in [place].     Family History  Problem Relation Age of Onset  . Hypertension Other   . Diabetes Other   . Stroke Other   . CAD Other       Marti Sleigh, MD 12/28/2015, 12:08 PM

## 2016-03-20 ENCOUNTER — Other Ambulatory Visit (HOSPITAL_BASED_OUTPATIENT_CLINIC_OR_DEPARTMENT_OTHER): Payer: Medicare Other

## 2016-03-20 ENCOUNTER — Ambulatory Visit (HOSPITAL_COMMUNITY)
Admission: RE | Admit: 2016-03-20 | Discharge: 2016-03-20 | Disposition: A | Discharge status: Home / Self Care | Payer: Medicare Other | Source: Ambulatory Visit | Attending: Internal Medicine | Admitting: Internal Medicine

## 2016-03-20 ENCOUNTER — Encounter (HOSPITAL_COMMUNITY): Payer: Self-pay

## 2016-03-20 DIAGNOSIS — I700 Atherosclerosis of aorta: Secondary | ICD-10-CM | POA: Insufficient documentation

## 2016-03-20 DIAGNOSIS — C3412 Malignant neoplasm of upper lobe, left bronchus or lung: Secondary | ICD-10-CM

## 2016-03-20 DIAGNOSIS — I2510 Atherosclerotic heart disease of native coronary artery without angina pectoris: Secondary | ICD-10-CM | POA: Diagnosis not present

## 2016-03-20 LAB — CBC WITH DIFFERENTIAL/PLATELET
BASO%: 0.7 % (ref 0.0–2.0)
Basophils Absolute: 0 10*3/uL (ref 0.0–0.1)
EOS%: 1.7 % (ref 0.0–7.0)
Eosinophils Absolute: 0.1 10*3/uL (ref 0.0–0.5)
HCT: 39.1 % (ref 34.8–46.6)
HGB: 12.2 g/dL (ref 11.6–15.9)
LYMPH%: 28.6 % (ref 14.0–49.7)
MCH: 21.8 pg — ABNORMAL LOW (ref 25.1–34.0)
MCHC: 31.2 g/dL — ABNORMAL LOW (ref 31.5–36.0)
MCV: 69.9 fL — ABNORMAL LOW (ref 79.5–101.0)
MONO#: 0.3 10*3/uL (ref 0.1–0.9)
MONO%: 8.1 % (ref 0.0–14.0)
NEUT#: 2.6 10*3/uL (ref 1.5–6.5)
NEUT%: 60.9 % (ref 38.4–76.8)
Platelets: 197 10*3/uL (ref 145–400)
RBC: 5.59 10*6/uL — ABNORMAL HIGH (ref 3.70–5.45)
RDW: 14.3 % (ref 11.2–14.5)
WBC: 4.2 10*3/uL (ref 3.9–10.3)
lymph#: 1.2 10*3/uL (ref 0.9–3.3)

## 2016-03-20 LAB — COMPREHENSIVE METABOLIC PANEL
ALT: 23 U/L (ref 0–55)
AST: 23 U/L (ref 5–34)
Albumin: 4 g/dL (ref 3.5–5.0)
Alkaline Phosphatase: 86 U/L (ref 40–150)
Anion Gap: 9 mEq/L (ref 3–11)
BUN: 24.6 mg/dL (ref 7.0–26.0)
CO2: 29 mEq/L (ref 22–29)
Calcium: 10.2 mg/dL (ref 8.4–10.4)
Chloride: 102 mEq/L (ref 98–109)
Creatinine: 0.8 mg/dL (ref 0.6–1.1)
EGFR: 75 mL/min/{1.73_m2} — ABNORMAL LOW (ref >90)
Glucose: 103 mg/dl (ref 70–140)
Potassium: 4.1 mEq/L (ref 3.5–5.1)
Sodium: 139 mEq/L (ref 136–145)
Total Bilirubin: 0.63 mg/dL (ref 0.20–1.20)
Total Protein: 6.9 g/dL (ref 6.4–8.3)

## 2016-03-20 MED ORDER — SODIUM CHLORIDE 0.9 % IJ SOLN
INTRAMUSCULAR | Status: AC
  Filled 2016-03-20: qty 50

## 2016-03-20 MED ORDER — IOPAMIDOL (ISOVUE-300) INJECTION 61%
75.0000 mL | Freq: Once | INTRAVENOUS | Status: AC | PRN
  Administered 2016-03-20: 75 mL via INTRAVENOUS

## 2016-03-20 MED ORDER — IOPAMIDOL (ISOVUE-300) INJECTION 61%
INTRAVENOUS | Status: DC
Start: 2016-03-20 — End: 2016-03-20
  Filled 2016-03-20: qty 75

## 2016-03-27 ENCOUNTER — Encounter: Payer: Self-pay | Admitting: Internal Medicine

## 2016-03-27 ENCOUNTER — Ambulatory Visit (HOSPITAL_BASED_OUTPATIENT_CLINIC_OR_DEPARTMENT_OTHER): Payer: Medicare Other | Admitting: Internal Medicine

## 2016-03-27 ENCOUNTER — Telehealth: Payer: Self-pay | Admitting: Internal Medicine

## 2016-03-27 VITALS — BP 159/78 | HR 59 | Temp 98.8°F | Resp 18 | Ht 62.25 in | Wt 151.4 lb

## 2016-03-27 DIAGNOSIS — C3412 Malignant neoplasm of upper lobe, left bronchus or lung: Secondary | ICD-10-CM | POA: Diagnosis not present

## 2016-03-27 NOTE — Telephone Encounter (Signed)
Patient bypassed scheduling/check out area. Appointments scheduled per 03/27/16 los. Patient was mailed a copy of the appointment letter and schedule, per 03/27/16 los.

## 2016-03-27 NOTE — Progress Notes (Signed)
[place] [phone]   Fax:([phone]) [place], [place] [address]  DIAGNOSIS: stage IA (T1b, N0, M0) non-small cell lung cancer, adenocarcinoma presented with left upper lobe pulmonary nodule.  PRIOR THERAPY: status post lingula sparing left upper lobectomy with lymph node dissection on 08/01/2015.  CURRENT THERAPY: Observation.  INTERVAL HISTORY: [name] Richards 71 y.o. female returns to the clinic today for six-month follow-up visit. The patient is feeling fine today with no specific complaints. She denied having any fever or chills. She has no chest pain, shortness of breath, cough or hemoptysis. The patient denied having any nausea or vomiting. She has no significant weight loss or night sweats. She had repeat CT scan of the chest performed recently and she is here for evaluation and discussion of her scan results.  MEDICAL HISTORY: Past Medical History:  Diagnosis Date  . 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 Asthma   . Cervical cancer ([facility]) 1997   poorly differentiated cervical carcinoma. S/p TAH and radiation x6 wks   . Complication of anesthesia    took 2 days to wake up after 1 surgery       pt cants use a adult cath,needs peds. cath due to past injury  . Constipation    very worried about this  . Endometrial cancer ([facility]) 1997   coincidental endometrial cancer also  . Hyperlipemia   . Hypertension   . Hypothyroidism   . Primary cancer of left upper lobe of lung ([facility]) 08/01/2015  . Small bowel obstruction    12/2008, repeat SBO in 02/2011 also with terminal ilietis   . Thyroid disease   . Vertebral body hemangioma    T12    ALLERGIES:  is allergic to codeine; penicillins; prochlorperazine edisylate; fentanyl; percocet [oxycodone-acetaminophen]; prochlorperazine; and penicillin g sodium.  MEDICATIONS:  Current Outpatient Prescriptions    Medication Sig Dispense Refill  . acidophilus (RISAQUAD) CAPS Take 1 capsule by mouth daily.    Marland Kitchen albuterol (PROVENTIL HFA;VENTOLIN HFA) 108 (90 Base) MCG/ACT inhaler Inhale 2 puffs into the lungs every 6 (six) hours as needed for wheezing or shortness of breath. Reported on 06/29/2015    . benazepril-hydrochlorthiazide (LOTENSIN HCT) 20-12.5 MG per tablet Take 1 tablet by mouth daily.    . Biotin 10 MG TABS 1 tablet    . calcium elemental as carbonate (TUMS ULTRA 1000) 400 MG chewable tablet 1 tablet    . cholecalciferol (VITAMIN D) 1000 UNITS tablet Take 1,000 Units by mouth 2 (two) times daily.     . clobetasol ointment (TEMOVATE) 0.05 % Apply topically daily as needed.     . dicyclomine (BENTYL) 20 MG tablet Take 1 tablet (20 mg total) by mouth every 6 (six) hours as needed for spasms (for abdominal cramping). 20 tablet 0  . docusate sodium (COLACE) 100 MG capsule Take 100 mg by mouth 2 (two) times daily.    Marland Kitchen levothyroxine (SYNTHROID, LEVOTHROID) 75 MCG tablet Take 75 mcg by mouth daily.    Marland Kitchen loratadine (CLARITIN) 10 MG tablet Take 10 mg by mouth daily as needed for allergies.    . Magnesium 300 MG CAPS 1 capsule with a meal    . Multiple Vitamin (MULTI-VITAMINS) TABS as directed    . Omega-3 Fatty Acids (FISH OIL) 1000 MG CAPS 1 tablet    . ondansetron (ZOFRAN ODT) 8 MG disintegrating  tablet Take 1 tablet (8 mg total) by mouth every 8 (eight) hours as needed for nausea or vomiting. (Patient not taking: Reported on 12/28/2015) 10 tablet 0  . simvastatin (ZOCOR) 20 MG tablet Take 20 mg by mouth every evening.    . valACYclovir (VALTREX) 1000 MG tablet Take 1,000 mg by mouth 2 (two) times daily as needed (for 2 days for cold sores). Reported on 06/29/2015    . vitamin C (ASCORBIC ACID) 500 MG tablet Take 500 mg by mouth daily.     No current facility-administered medications for this visit.     SURGICAL HISTORY:  Past Surgical History:  Procedure Laterality Date  . ABDOMINAL HYSTERECTOMY   Feb 1997   TAH/BSO, pelvic and periaortic lymphadenectomy   . BACK SURGERY    . LYMPH NODE DISSECTION Left 08/01/2015   Procedure: LYMPH NODE DISSECTION;  Surgeon: Melrose Nakayama, MD;  Location: [facility];  Service: Thoracic;  Laterality: Left;  . PARTIAL NEPHRECTOMY     right  . SEGMENTECOMY Left 08/01/2015   Procedure: Left Upper Lobe SEGMENTECTOMY;  Surgeon: Melrose Nakayama, MD;  Location: [facility];  Service: Thoracic;  Laterality: Left;  . SKIN BIOPSY    . TUBAL LIGATION    . TUMOR REMOVAL     meningioma  T 12  . VIDEO ASSISTED THORACOSCOPY (VATS)/WEDGE RESECTION Left 08/01/2015   Procedure: VIDEO ASSISTED THORACOSCOPY (VATS)/WEDGE RESECTION;  Surgeon: Melrose Nakayama, MD;  Location: [facility];  Service: Thoracic;  Laterality: Left;    REVIEW OF SYSTEMS:  A comprehensive review of systems was negative.   PHYSICAL EXAMINATION: General appearance: alert, cooperative and no distress Head: Normocephalic, without obvious abnormality, atraumatic Neck: no adenopathy, no JVD, supple, symmetrical, trachea midline and thyroid not enlarged, symmetric, no tenderness/mass/nodules Lymph nodes: Cervical, supraclavicular, and axillary nodes normal. Resp: clear to auscultation bilaterally Back: symmetric, no curvature. ROM normal. No CVA tenderness. Cardio: regular rate and rhythm, S1, S2 normal, no murmur, click, rub or gallop GI: soft, non-tender; bowel sounds normal; no masses,  no organomegaly Extremities: extremities normal, atraumatic, no cyanosis or edema  ECOG PERFORMANCE STATUS: 0 - Asymptomatic  Blood pressure (!) 159/78, pulse (!) 59, temperature 98.8 F (37.1 C), temperature source Oral, resp. rate 18, height 5' 2.25" (1.581 m), weight 151 lb 6.4 oz (68.7 kg), SpO2 100 %.  LABORATORY DATA: Lab Results  Component Value Date   WBC 4.2 03/20/2016   HGB 12.2 03/20/2016   HCT 39.1 03/20/2016   MCV 69.9 (L) 03/20/2016   PLT 197 03/20/2016      Chemistry      Component Value  Date/Time   NA 139 03/20/2016 0740   K 4.1 03/20/2016 0740   CL 98 (L) 08/06/2015 1146   CO2 29 03/20/2016 0740   BUN 24.6 03/20/2016 0740   CREATININE 0.8 03/20/2016 0740      Component Value Date/Time   CALCIUM 10.2 03/20/2016 0740   ALKPHOS 86 03/20/2016 0740   AST 23 03/20/2016 0740   ALT 23 03/20/2016 0740   BILITOT 0.63 03/20/2016 0740       RADIOGRAPHIC STUDIES: Ct Chest W Contrast  Result Date: 03/20/2016 CLINICAL DATA:  Stage IA lung adenocarcinoma of the left upper lobe status post lingular sparing left upper lobectomy 08/01/2015, presenting for restaging. EXAM: CT CHEST WITH CONTRAST TECHNIQUE: Multidetector CT imaging of the chest was performed during intravenous contrast administration. CONTRAST:  18m ISOVUE-300 IOPAMIDOL (ISOVUE-300) INJECTION 61% COMPARISON:  07/12/2015 chest CT. FINDINGS: Cardiovascular: Normal heart  size. No significant pericardial fluid/thickening. Left anterior descending and right coronary atherosclerosis. Atherosclerotic nonaneurysmal thoracic aorta. Normal caliber pulmonary arteries. No central pulmonary emboli. Mediastinum/Nodes: No discrete thyroid nodules. Unremarkable esophagus. No pathologically enlarged axillary, mediastinal or hilar lymph nodes. Lungs/Pleura: No pneumothorax. No pleural effusion. Interval lingular sparing left upper lobectomy. Subpleural basilar right upper lobe 3 mm solid pulmonary nodule (series 5/ image 63) is not appreciably changed back to 02/19/2011 and considered benign . Ground-glass 4 mm superior segment right lower lobe pulmonary nodule (series 5/ image 44) is stable back to the 02/19/2011 and considered benign. Perifissural 2 mm anterior right lower lobe pulmonary nodule (series 5/ image 69) is stable back to 02/19/2011 and considered benign. Two additional right lower lobe pulmonary nodules, largest 4 mm (series 5/image 90) are stable back to 02/19/2011 and considered benign. Mildly irregular bandlike opacity along the  suture line in the medial left upper lobe. Otherwise no acute consolidative airspace disease, lung masses or new significant pulmonary nodules. Upper abdomen: Stable mild scarring in the upper right kidney. Musculoskeletal: No aggressive appearing focal osseous lesions. Marked thoracic spondylosis. IMPRESSION: 1. Interval lingular sparing left upper lobectomy. Mildly irregular bandlike opacity along the suture line in the medial left upper lobe is most consistent with postsurgical scarring, and warrants continued attention on follow-up chest CT. 2. No evidence of metastatic disease in the chest . 3. Aortic atherosclerosis.  Two-vessel coronary atherosclerosis. Electronically Signed   By: Ilona Sorrel M.D.   On: 03/20/2016 11:13    ASSESSMENT AND PLAN: This is a very pleasant 71 years old white female with history of stage IA non-small cell lung cancer status post lingular sparing left upper lobectomy with lymph node dissection in June 2017. The patient has been on observation since that time. Her recent CT scan of the chest showed no evidence for disease recurrence. I discussed the scan results with the patient today. I recommended for her to continue on observation with repeat CT scan of the chest in 6 months. She was advised to call immediately if she has any concerning symptoms in the interval.The patient voices understanding of current disease status and treatment options and is in agreement with the current care plan.  All questions were answered. The patient knows to call the clinic with any problems, questions or concerns. We can certainly see the patient much sooner if necessary.  I spent 10 minutes counseling the patient face to face. The total time spent in the appointment was 15 minutes.  Disclaimer: This note was dictated with voice recognition software. Similar sounding words can inadvertently be transcribed and may not be corrected upon review.

## 2016-03-31 ENCOUNTER — Encounter: Payer: Self-pay | Admitting: Gynecology

## 2016-03-31 ENCOUNTER — Ambulatory Visit: Payer: Medicare Other | Attending: Gynecology | Admitting: Gynecology

## 2016-03-31 VITALS — BP 136/73 | HR 61 | Temp 99.4°F | Resp 18 | Ht 62.0 in | Wt 152.7 lb

## 2016-03-31 DIAGNOSIS — L280 Lichen simplex chronicus: Secondary | ICD-10-CM | POA: Diagnosis not present

## 2016-03-31 DIAGNOSIS — J45909 Unspecified asthma, uncomplicated: Secondary | ICD-10-CM | POA: Insufficient documentation

## 2016-03-31 DIAGNOSIS — Z8544 Personal history of malignant neoplasm of other female genital organs: Secondary | ICD-10-CM | POA: Diagnosis not present

## 2016-03-31 DIAGNOSIS — E785 Hyperlipidemia, unspecified: Secondary | ICD-10-CM | POA: Diagnosis not present

## 2016-03-31 DIAGNOSIS — K520 Gastroenteritis and colitis due to radiation: Secondary | ICD-10-CM | POA: Diagnosis not present

## 2016-03-31 DIAGNOSIS — C519 Malignant neoplasm of vulva, unspecified: Secondary | ICD-10-CM

## 2016-03-31 DIAGNOSIS — E039 Hypothyroidism, unspecified: Secondary | ICD-10-CM | POA: Insufficient documentation

## 2016-03-31 DIAGNOSIS — Z9889 Other specified postprocedural states: Secondary | ICD-10-CM | POA: Diagnosis not present

## 2016-03-31 DIAGNOSIS — Z905 Acquired absence of kidney: Secondary | ICD-10-CM | POA: Diagnosis not present

## 2016-03-31 DIAGNOSIS — Z8541 Personal history of malignant neoplasm of cervix uteri: Secondary | ICD-10-CM | POA: Diagnosis not present

## 2016-03-31 DIAGNOSIS — Z08 Encounter for follow-up examination after completed treatment for malignant neoplasm: Secondary | ICD-10-CM | POA: Diagnosis present

## 2016-03-31 DIAGNOSIS — Z8249 Family history of ischemic heart disease and other diseases of the circulatory system: Secondary | ICD-10-CM | POA: Insufficient documentation

## 2016-03-31 DIAGNOSIS — Z85118 Personal history of other malignant neoplasm of bronchus and lung: Secondary | ICD-10-CM | POA: Diagnosis not present

## 2016-03-31 DIAGNOSIS — I10 Essential (primary) hypertension: Secondary | ICD-10-CM | POA: Insufficient documentation

## 2016-03-31 DIAGNOSIS — Z8542 Personal history of malignant neoplasm of other parts of uterus: Secondary | ICD-10-CM | POA: Diagnosis not present

## 2016-03-31 NOTE — Patient Instructions (Signed)
Please call our office in May to schedule your August 2018 appointment.

## 2016-03-31 NOTE — Progress Notes (Signed)
Consult Note: Gyn-Onc   [name] Richards 71 y.o. female  No chief complaint on file.   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 No evidence of disease.   Marland KitchenPast history of Poorly differentiated endometrial and cervical cancer 1997. No evidence of disease.  Intermittent partial small bowel obstruction (no recurrences over the past 6 months). 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 She'll call if she has any new symptoms or physical findings.  She's encouraged to stick with her modified diet as that seems to be working well.  . Interval History:  Patient returns today for continuing follow-up as scheduled. Since her last visit she's had no problems in the vulva.    She has been more vigilant with regard to her dietary modifications and is had no subsequent episodes of small bowel obstruction over the past 6 months.. She is planning a [place] cruise in April and a trip to [place] in September. She continues to work out consistently at [employer].   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 She underwent a total abdominal hysterectomy and bilateral salpingo-oophorectomy, pelvic and para-aortic lymphadenectomy. She received postoperative whole pelvis radiation therapy.  Her other primary gynecologic problem has been lichen sclerosus managed with when necessary clobetasol.  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  In December 2016 the patient was found to have a new primary squamous cell carcinoma the vulva undergoing a modified radical vulvectomy on 02/06/2015. She underwent a modified radical vulvectomy at [facility] on  02/06/2015. She was found to have a 3 cm vulvar lesion of the posterior vulva extending into the posterior vagina. A rhomboid flap was required to close the incision. Patient had on pelvic postoperative course. She reports she's been doing well. She irrigates the wound twice a day with a tepid water and a blow dryer. She denies any drainage or pain or fever. She has been walking approximate half mile a day.  Final pathology showed some close surgical margins. However, the specimen margins were toward the time of surgery and it is my impression that we were widely around the primary cancer. I discussed these findings with the patient and we've agreed to monitor her closely but not recommend any radiation therapy to the vulva. The patient's agreement with this recommendation.   Review of Systems:10 point review of systems is negative except as noted in interval history.   Vitals: Blood pressure 136/73, pulse 61, temperature 99.4 F (37.4 C), temperature source Oral, resp. rate 18, height '5\' 2"'$  (1.575 m), weight 152 lb 11.2 oz (69.3 kg), SpO2 100 %.  Physical Exam: General : The patient is a healthy woman in no acute distress.  HEENT: normocephalic, extraoccular movements normal; neck is supple without thyromegally  Lynphnodes: Supraclavicular and inguinal nodes not enlarged    Abdomen: Soft, non-tender, no ascites, no organomegally, no masses, no hernias  Pelvic:  EGBUS: Normal female mild lichen sclerosis on the anterior vulva.  The perineum is well-healed. There are no lesions.  Lower extremities: No edema or varicosities. Normal range of motion      Allergies  Allergen Reactions  . Codeine Other (See Comments)    Other reaction(s): amnesia Reporting amnesia  . Penicillins Hives,  Rash and Other (See Comments)    Has patient had a PCN reaction causing immediate rash, facial/tongue/throat swelling, SOB or lightheadedness with hypotension: NO Has patient had a PCN reaction causing SEVERE  RASH INVOLVING MUCUS MEMBRANES OR SKIN NECROSIS: YES Has patient had a PCN reaction that REQUIRED HOSPITALIZATION : patient denies hospitalization Has patient had a PCN reaction occurring within the last 10 years: NO    . Prochlorperazine Edisylate Other (See Comments)    JAWS LOCKED EXTRAPYRAMIDAL Rx  . Fentanyl Other (See Comments)    Other reaction(s): low blood pressure hypotension  . Percocet [Oxycodone-Acetaminophen] Other (See Comments)    Made pt feel real funny, never wanting to take it again; near syncopal episode  . Prochlorperazine     Other reaction(s): extrapyramidal reaction Other reaction(s): extrapyramidal reaction  . Penicillin G Sodium Rash    Other reaction(s): rash    Past Medical History:  Diagnosis Date  . 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 Asthma   . Cervical cancer ([place]) 1997   poorly differentiated cervical carcinoma. S/p TAH and radiation x6 wks   . Complication of anesthesia    took 2 days to wake up after 1 surgery       pt cants use a adult cath,needs peds. cath due to past injury  . Constipation    very worried about this  . Endometrial cancer ([place]) 1997   coincidental endometrial cancer also  . Hyperlipemia   . Hypertension   . Hypothyroidism   . Primary cancer of left upper lobe of lung ([place]) 08/01/2015  . Small bowel obstruction    12/2008, repeat SBO in 02/2011 also with terminal ilietis   . Thyroid disease   . Vertebral body hemangioma    T12    Past Surgical History:  Procedure Laterality Date  . ABDOMINAL HYSTERECTOMY  Feb 1997   TAH/BSO, pelvic and periaortic lymphadenectomy   . BACK SURGERY    . LYMPH NODE DISSECTION Left 08/01/2015   Procedure: LYMPH NODE DISSECTION;  Surgeon: Melrose Nakayama, MD;  Location: [facility];  Service: Thoracic;  Laterality: Left;  . PARTIAL NEPHRECTOMY     right  . SEGMENTECOMY Left 08/01/2015   Procedure: Left Upper Lobe SEGMENTECTOMY;  Surgeon: Melrose Nakayama, MD;   Location: [facility];  Service: Thoracic;  Laterality: Left;  . SKIN BIOPSY    . TUBAL LIGATION    . TUMOR REMOVAL     meningioma  T 12  . VIDEO ASSISTED THORACOSCOPY (VATS)/WEDGE RESECTION Left 08/01/2015   Procedure: VIDEO ASSISTED THORACOSCOPY (VATS)/WEDGE RESECTION;  Surgeon: Melrose Nakayama, MD;  [facility];  Service: Thoracic;  Laterality: Left;    Current Outpatient Prescriptions  Medication Sig Dispense Refill  . acidophilus (RISAQUAD) CAPS Take 1 capsule by mouth daily.    Marland Kitchen albuterol (PROVENTIL HFA;VENTOLIN HFA) 108 (90 Base) MCG/ACT inhaler Inhale 2 puffs into the lungs every 6 (six) hours as needed for wheezing or shortness of breath. Reported on 06/29/2015    . benazepril-hydrochlorthiazide (LOTENSIN HCT) 20-12.5 MG per tablet Take 1 tablet by mouth daily.    . Biotin 10 MG TABS 1 tablet    . calcium elemental as carbonate (TUMS ULTRA 1000) 400 MG chewable tablet 1 tablet    . cholecalciferol (VITAMIN D) 1000 UNITS tablet Take 1,000 Units by mouth 2 (two) times daily.     . clobetasol ointment (TEMOVATE) 0.05 % Apply topically daily as needed.     Marland Kitchen  dicyclomine (BENTYL) 20 MG tablet Take 1 tablet (20 mg total) by mouth every 6 (six) hours as needed for spasms (for abdominal cramping). 20 tablet 0  . docusate sodium (COLACE) 100 MG capsule Take 100 mg by mouth 2 (two) times daily.    Marland Kitchen levothyroxine (SYNTHROID, LEVOTHROID) 75 MCG tablet Take 75 mcg by mouth daily.    Marland Kitchen loratadine (CLARITIN) 10 MG tablet Take 10 mg by mouth daily as needed for allergies.    . Magnesium 300 MG CAPS 1 capsule with a meal    . Multiple Vitamin (MULTI-VITAMINS) TABS as directed    . Omega-3 Fatty Acids (FISH OIL) 1000 MG CAPS 1 tablet    . ondansetron (ZOFRAN ODT) 8 MG disintegrating tablet Take 1 tablet (8 mg total) by mouth every 8 (eight) hours as needed for nausea or vomiting. (Patient not taking: Reported on 12/28/2015) 10 tablet 0  . simvastatin (ZOCOR) 20 MG tablet Take 20 mg by mouth  every evening.    . valACYclovir (VALTREX) 1000 MG tablet Take 1,000 mg by mouth 2 (two) times daily as needed (for 2 days for cold sores). Reported on 06/29/2015    . vitamin C (ASCORBIC ACID) 500 MG tablet Take 500 mg by mouth daily.     No current facility-administered medications for this visit.     Social History   Social History  . Marital status: Divorced    Spouse name: N/A  . Number of children: N/A  . Years of education: N/A   Occupational History  . Not on file.   Social History Main Topics  . Smoking status: Never Smoker  . Smokeless tobacco: Never Used  . Alcohol use No  . Drug use: No  . Sexual activity: No   Other Topics Concern  . Not on file   Social History Narrative   Lives at home alone, works out at [employer] 6 days a week, still very active. Has an ex-husband who she is in contact with. Son died of an MI and another son in [place].     Family History  Problem Relation Age of Onset  . Hypertension Other   . Diabetes Other   . Stroke Other   . CAD Other       Marti Sleigh, MD 03/31/2016, 12:20 PM       Consult Note: Gyn-Onc   [name] Richards 71 y.o. female  No chief complaint on file.   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 No evidence of disease.  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  Past history of Poorly differentiated endometrial and cervical cancer 1997. No evidence of disease.  Intermittent partial small bowel obstruction. Lichen sclerosis of the vulva (stable)  Plan:  Patient returns seem me in 3 months. She'll call she has any new symptoms or physical findings. She's encouraged to reduce the amount of fiber in her diet. Also she finds that there are particular foods cause diarrhea she should avoid them. We had a lengthy discussion about adhesions as well as radiation enteritis. Clearly we are trying to avoid any surgical intervention if possible. Interval  History:  Patient returns today for continuing follow-up as scheduled. Since her last visit she's had no problems in the vulva. However, she has had 2 episodes of severe diarrhea and abdominal cramping. One was also was traveling in [place] and the other week later here in [place]. She managed both of these at home conservatively. She denies  any nausea or vomiting associated with these episodes. She does note that she became more liberal with her diet while she was traveling eating salads and a broader selection of food than she usually does at home.   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 She underwent a total abdominal hysterectomy and bilateral salpingo-oophorectomy, pelvic and para-aortic lymphadenectomy. She received postoperative whole pelvis radiation therapy.  Her other primary gynecologic problem has been lichen sclerosus managed with when necessary clobetasol.  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  In December 2016 the patient was found to have a new primary squamous cell carcinoma the vulva undergoing a modified radical vulvectomy on 02/06/2015. She underwent a modified radical vulvectomy at [facility] on 02/06/2015. She was found to have a 3 cm vulvar lesion of the posterior vulva extending into the posterior vagina. A rhomboid flap was required to close the incision. Patient had on pelvic postoperative course. She reports she's been doing well. She irrigates the wound twice a day with a tepid water and a blow dryer. She denies any drainage or pain or fever. She has been walking approximate half mile a day.  Final pathology showed some close surgical margins. However, the specimen margins were toward the time of surgery and it is my impression that we were widely  around the primary cancer. I discussed these findings with the patient and we've agreed to monitor her closely but not recommend any radiation therapy to the vulva. The patient's agreement with this recommendation.   Review of Systems:10 point review of systems is negative except as noted in interval history.   Vitals: Blood pressure 136/73, pulse 61, temperature 99.4 F (37.4 C), temperature source Oral, resp. rate 18, height '5\' 2"'$  (1.575 m), weight 152 lb 11.2 oz (69.3 kg), SpO2 100 %.  Physical Exam: General : The patient is a healthy woman in no acute distress.  HEENT: normocephalic, extraoccular movements normal; neck is supple without thyromegally  Lynphnodes: Supraclavicular and inguinal nodes not enlarged    Abdomen: Soft, non-tender, no ascites, no organomegally, no masses, no hernias  Pelvic:  EGBUS: Normal female mild lichen sclerosis on the anterior vulva.  The perineum is well-healed. There are no lesions.  Lower extremities: No edema or varicosities. Normal range of motion      Allergies  Allergen Reactions  . Codeine Other (See Comments)    Other reaction(s): amnesia Reporting amnesia  . Penicillins Hives, Rash and Other (See Comments)    Has patient had a PCN reaction causing immediate rash, facial/tongue/throat swelling, SOB or lightheadedness with hypotension: NO Has patient had a PCN reaction causing SEVERE RASH INVOLVING MUCUS MEMBRANES OR SKIN NECROSIS: YES Has patient had a PCN reaction that REQUIRED HOSPITALIZATION : patient denies hospitalization Has patient had a PCN reaction occurring within the last 10 years: NO    . Prochlorperazine Edisylate Other (See Comments)    JAWS LOCKED EXTRAPYRAMIDAL Rx  . Fentanyl Other (See Comments)    Other reaction(s): low blood pressure hypotension  . Percocet [Oxycodone-Acetaminophen] Other (See Comments)    Made pt feel real funny, never wanting to take it again; near syncopal episode  . Prochlorperazine      Other reaction(s): extrapyramidal reaction Other reaction(s): extrapyramidal reaction  . Penicillin G Sodium Rash    Other reaction(s): rash    Past Medical History:  Diagnosis Date  . 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 Asthma   . Cervical cancer ([place]) 1997   poorly differentiated cervical carcinoma. S/p TAH and radiation x6 wks   . Complication of anesthesia    took 2 days to wake up after 1 surgery       pt cants use a adult cath,needs peds. cath due to past injury  . Constipation    very worried about this  . Endometrial cancer ([place]) 1997   coincidental endometrial cancer also  . Hyperlipemia   . Hypertension   . Hypothyroidism   . Primary cancer of left upper lobe of lung ([place]) 08/01/2015  . Small bowel obstruction    12/2008, repeat SBO in 02/2011 also with terminal ilietis   . Thyroid disease   . Vertebral body hemangioma    T12    Past Surgical History:  Procedure Laterality Date  . ABDOMINAL HYSTERECTOMY  Feb 1997   TAH/BSO, pelvic and periaortic lymphadenectomy   . BACK SURGERY    . LYMPH NODE DISSECTION Left 08/01/2015   Procedure: LYMPH NODE DISSECTION;  Surgeon: Melrose Nakayama, MD;  Location: [facility];  Service: Thoracic;  Laterality: Left;  . PARTIAL NEPHRECTOMY     right  . SEGMENTECOMY Left 08/01/2015   Procedure: Left Upper Lobe SEGMENTECTOMY;  Surgeon: Melrose Nakayama, MD;  Location: [facility];  Service: Thoracic;  Laterality: Left;  . SKIN BIOPSY    . TUBAL LIGATION    . TUMOR REMOVAL     meningioma  T 12  . VIDEO ASSISTED THORACOSCOPY (VATS)/WEDGE RESECTION Left 08/01/2015   Procedure: VIDEO ASSISTED THORACOSCOPY (VATS)/WEDGE RESECTION;  Surgeon: Melrose Nakayama, MD;  [facility];  Service: Thoracic;  Laterality: Left;    Current Outpatient Prescriptions  Medication Sig Dispense Refill  . acidophilus (RISAQUAD) CAPS Take 1 capsule by mouth daily.    Marland Kitchen albuterol (PROVENTIL HFA;VENTOLIN HFA) 108 (90 Base) MCG/ACT  inhaler Inhale 2 puffs into the lungs every 6 (six) hours as needed for wheezing or shortness of breath. Reported on 06/29/2015    . benazepril-hydrochlorthiazide (LOTENSIN HCT) 20-12.5 MG per tablet Take 1 tablet by mouth daily.    . Biotin 10 MG TABS 1 tablet    . calcium elemental as carbonate (TUMS ULTRA 1000) 400 MG chewable tablet 1 tablet    . cholecalciferol (VITAMIN D) 1000 UNITS tablet Take 1,000 Units by mouth 2 (two) times daily.     . clobetasol ointment (TEMOVATE) 0.05 % Apply topically daily as needed.     . dicyclomine (BENTYL) 20 MG tablet Take 1 tablet (20 mg total) by mouth every 6 (six) hours as needed for spasms (for abdominal cramping). 20 tablet 0  . docusate sodium (COLACE) 100 MG capsule Take 100 mg by mouth 2 (two) times daily.    Marland Kitchen levothyroxine (SYNTHROID, LEVOTHROID) 75 MCG tablet Take 75 mcg by mouth daily.    Marland Kitchen loratadine (CLARITIN) 10 MG tablet Take 10 mg by mouth daily as needed for allergies.    . Magnesium 300 MG CAPS 1 capsule with a meal    . Multiple Vitamin (MULTI-VITAMINS) TABS as directed    . Omega-3 Fatty Acids (FISH OIL) 1000 MG CAPS 1 tablet    . ondansetron (ZOFRAN ODT) 8 MG disintegrating tablet Take 1 tablet (8 mg total) by mouth every 8 (eight) hours as needed for nausea or vomiting. (Patient not taking: Reported on 12/28/2015) 10 tablet 0  .  simvastatin (ZOCOR) 20 MG tablet Take 20 mg by mouth every evening.    . valACYclovir (VALTREX) 1000 MG tablet Take 1,000 mg by mouth 2 (two) times daily as needed (for 2 days for cold sores). Reported on 06/29/2015    . vitamin C (ASCORBIC ACID) 500 MG tablet Take 500 mg by mouth daily.     No current facility-administered medications for this visit.     Social History   Social History  . Marital status: Divorced    Spouse name: N/A  . Number of children: N/A  . Years of education: N/A   Occupational History  . Not on file.   Social History Main Topics  . Smoking status: Never Smoker  . Smokeless  tobacco: Never Used  . Alcohol use No  . Drug use: No  . Sexual activity: No   Other Topics Concern  . Not on file   Social History Narrative   Lives at home alone, works out at [employer] 6 days a week, still very active. Has an ex-husband who she is in contact with. Son died of an MI and another son in [place].     Family History  Problem Relation Age of Onset  . Hypertension Other   . Diabetes Other   . Stroke Other   . CAD Other       Marti Sleigh, MD 03/31/2016, 12:20 PM

## 2016-07-02 ENCOUNTER — Telehealth: Payer: Self-pay | Admitting: *Deleted

## 2016-07-02 NOTE — Telephone Encounter (Signed)
Patient called and scheduled her follow up appt for August. Appt scheduled and patient aware of the date/time.

## 2016-08-30 ENCOUNTER — Encounter: Payer: Self-pay | Admitting: Internal Medicine

## 2016-09-15 ENCOUNTER — Ambulatory Visit: Payer: Medicare Other | Admitting: Gynecology

## 2016-09-16 ENCOUNTER — Telehealth: Payer: Self-pay | Admitting: Internal Medicine

## 2016-09-16 ENCOUNTER — Telehealth: Payer: Self-pay | Admitting: *Deleted

## 2016-09-16 NOTE — Telephone Encounter (Signed)
lvm to inform pt of 8/9 lab appt at 215 per sch msg. Unable to sch lab on 8/10 due to CT sched for 0830

## 2016-09-16 NOTE — Telephone Encounter (Signed)
Late entry---Attempted to contact the patient yesterday 8/6 at 11:52am to move her 8/31 appt from 8:15am to 10:30am. Left message to voicemail to call the office back.   Attempted to contact the patient today 8/7 at 12:28pm, left message on voicemail to call the office.

## 2016-09-17 ENCOUNTER — Telehealth: Payer: Self-pay | Admitting: *Deleted

## 2016-09-17 NOTE — Telephone Encounter (Signed)
Spoke with the patient to move appt on August 31st down to later time. Patient cancelled appt, saw doctor on July 31st.

## 2016-09-18 ENCOUNTER — Telehealth: Payer: Self-pay | Admitting: *Deleted

## 2016-09-18 ENCOUNTER — Other Ambulatory Visit (HOSPITAL_BASED_OUTPATIENT_CLINIC_OR_DEPARTMENT_OTHER): Payer: Medicare Other

## 2016-09-18 DIAGNOSIS — C3412 Malignant neoplasm of upper lobe, left bronchus or lung: Secondary | ICD-10-CM | POA: Diagnosis not present

## 2016-09-18 LAB — CBC WITH DIFFERENTIAL/PLATELET
BASO%: 0.8 % (ref 0.0–2.0)
Basophils Absolute: 0 10*3/uL (ref 0.0–0.1)
EOS%: 2.1 % (ref 0.0–7.0)
Eosinophils Absolute: 0.1 10*3/uL (ref 0.0–0.5)
HCT: 35.1 % (ref 34.8–46.6)
HGB: 11.5 g/dL — ABNORMAL LOW (ref 11.6–15.9)
LYMPH%: 30.9 % (ref 14.0–49.7)
MCH: 22.2 pg — ABNORMAL LOW (ref 25.1–34.0)
MCHC: 32.8 g/dL (ref 31.5–36.0)
MCV: 67.9 fL — ABNORMAL LOW (ref 79.5–101.0)
MONO#: 0.3 10*3/uL (ref 0.1–0.9)
MONO%: 7.5 % (ref 0.0–14.0)
NEUT#: 2.3 10*3/uL (ref 1.5–6.5)
NEUT%: 58.7 % (ref 38.4–76.8)
Platelets: 169 10*3/uL (ref 145–400)
RBC: 5.17 10*6/uL (ref 3.70–5.45)
RDW: 14.6 % — ABNORMAL HIGH (ref 11.2–14.5)
WBC: 3.9 10*3/uL (ref 3.9–10.3)
lymph#: 1.2 10*3/uL (ref 0.9–3.3)

## 2016-09-18 LAB — COMPREHENSIVE METABOLIC PANEL
ALT: 17 U/L (ref 0–55)
AST: 18 U/L (ref 5–34)
Albumin: 3.6 g/dL (ref 3.5–5.0)
Alkaline Phosphatase: 91 U/L (ref 40–150)
Anion Gap: 9 mEq/L (ref 3–11)
BUN: 15.8 mg/dL (ref 7.0–26.0)
CO2: 26 mEq/L (ref 22–29)
Calcium: 9.3 mg/dL (ref 8.4–10.4)
Chloride: 100 mEq/L (ref 98–109)
Creatinine: 0.8 mg/dL (ref 0.6–1.1)
EGFR: 70 mL/min/{1.73_m2} — ABNORMAL LOW (ref >90)
Glucose: 172 mg/dl — ABNORMAL HIGH (ref 70–140)
Potassium: 3.9 mEq/L (ref 3.5–5.1)
Sodium: 136 mEq/L (ref 136–145)
Total Bilirubin: 0.42 mg/dL (ref 0.20–1.20)
Total Protein: 6.3 g/dL — ABNORMAL LOW (ref 6.4–8.3)

## 2016-09-18 NOTE — Telephone Encounter (Signed)
Attempted to contact the patient regarding her next appt. Left message for her to contact the office

## 2016-09-19 ENCOUNTER — Encounter: Payer: Self-pay | Admitting: Gynecology

## 2016-09-19 ENCOUNTER — Ambulatory Visit (HOSPITAL_COMMUNITY)
Admission: RE | Admit: 2016-09-19 | Discharge: 2016-09-19 | Disposition: A | Discharge status: Home / Self Care | Payer: Medicare Other | Source: Ambulatory Visit | Attending: Internal Medicine | Admitting: Internal Medicine

## 2016-09-19 DIAGNOSIS — C3412 Malignant neoplasm of upper lobe, left bronchus or lung: Secondary | ICD-10-CM

## 2016-09-19 MED ORDER — IOPAMIDOL (ISOVUE-300) INJECTION 61%
INTRAVENOUS | Status: AC
  Administered 2016-09-19: 75 mL
  Filled 2016-09-19: qty 75

## 2016-09-24 ENCOUNTER — Other Ambulatory Visit: Payer: Medicare Other

## 2016-09-24 ENCOUNTER — Telehealth: Payer: Self-pay | Admitting: Internal Medicine

## 2016-09-24 ENCOUNTER — Encounter: Payer: Self-pay | Admitting: Internal Medicine

## 2016-09-24 ENCOUNTER — Ambulatory Visit (HOSPITAL_BASED_OUTPATIENT_CLINIC_OR_DEPARTMENT_OTHER): Payer: Medicare Other | Admitting: Internal Medicine

## 2016-09-24 VITALS — BP 152/69 | HR 66 | Temp 98.9°F | Resp 18 | Ht 62.0 in | Wt 153.4 lb

## 2016-09-24 DIAGNOSIS — I10 Essential (primary) hypertension: Secondary | ICD-10-CM | POA: Diagnosis not present

## 2016-09-24 DIAGNOSIS — C3412 Malignant neoplasm of upper lobe, left bronchus or lung: Secondary | ICD-10-CM

## 2016-09-24 DIAGNOSIS — E039 Hypothyroidism, unspecified: Secondary | ICD-10-CM | POA: Diagnosis not present

## 2016-09-24 NOTE — Telephone Encounter (Signed)
Scheduled appt per 8/15 los - Gave patient AVS and calender per los. Central radiology to contact patient with ct

## 2016-09-24 NOTE — Progress Notes (Signed)
[facility] [phone]   [phone]  OFFICE PROGRESS NOTE  Harlan Stains, MD [place] [address]  DIAGNOSIS: stage IA (T1b, N0, M0) non-small cell lung cancer, adenocarcinoma presented with left upper lobe pulmonary nodule.  PRIOR THERAPY: status post lingula sparing left upper lobectomy with lymph node dissection on 08/01/2015.  CURRENT THERAPY: Observation.  INTERVAL HISTORY: [name] Richards 71 y.o. female returns to the clinic today for 6 months follow-up visit. The patient is feeling fine today with no specific complaints. She denied having any chest pain, shortness breath, cough or hemoptysis. She denied having any fever or chills. She has no weight loss or night sweats. She denied having any nausea, vomiting, diarrhea or constipation. She had repeat CT scan of the chest performed recently and she is here today for evaluation and discussion of her scan results. She was very anxious about her scan results today.   MEDICAL HISTORY: Past Medical History:  Diagnosis Date  . 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 Asthma   . Cervical cancer ([place]) 1997   poorly differentiated cervical carcinoma. S/p TAH and radiation x6 wks   . Complication of anesthesia    took 2 days to wake up after 1 surgery       pt cants use a adult cath,needs peds. cath due to past injury  . Constipation    very worried about this  . Endometrial cancer ([facility]) 1997   coincidental endometrial cancer also  . Hyperlipemia   . Hypertension   . Hypothyroidism   . Primary cancer of left upper lobe of lung ([facility]) 08/01/2015  . Small bowel obstruction    12/2008, repeat SBO in 02/2011 also with terminal ilietis   . Thyroid disease   . Vertebral body hemangioma    T12    ALLERGIES:  is allergic to codeine; penicillins; prochlorperazine edisylate; fentanyl; percocet [oxycodone-acetaminophen]; prochlorperazine; and  penicillin g sodium.  MEDICATIONS:  Current Outpatient Prescriptions  Medication Sig Dispense Refill  . acidophilus (RISAQUAD) CAPS Take 1 capsule by mouth daily.    Marland Kitchen albuterol (PROVENTIL HFA;VENTOLIN HFA) 108 (90 Base) MCG/ACT inhaler Inhale 2 puffs into the lungs every 6 (six) hours as needed for wheezing or shortness of breath. Reported on 06/29/2015    . benazepril-hydrochlorthiazide (LOTENSIN HCT) 20-12.5 MG per tablet Take 1 tablet by mouth daily.    . Biotin 10 MG TABS 1 tablet    . calcium elemental as carbonate (TUMS ULTRA 1000) 400 MG chewable tablet 1 tablet    . cholecalciferol (VITAMIN D) 1000 UNITS tablet Take 1,000 Units by mouth 2 (two) times daily.     . clobetasol ointment (TEMOVATE) 0.05 % Apply topically daily as needed.     . dicyclomine (BENTYL) 20 MG tablet Take 1 tablet (20 mg total) by mouth every 6 (six) hours as needed for spasms (for abdominal cramping). 20 tablet 0  . docusate sodium (COLACE) 100 MG capsule Take 100 mg by mouth 2 (two) times daily.    Marland Kitchen levothyroxine (SYNTHROID, LEVOTHROID) 75 MCG tablet Take 75 mcg by mouth daily.    Marland Kitchen loratadine (CLARITIN) 10 MG tablet Take 10 mg by mouth daily as needed for allergies.    . Magnesium 300 MG CAPS 1 capsule with a meal    . Multiple Vitamin (MULTI-VITAMINS) TABS as directed    . Omega-3 Fatty Acids (FISH OIL) 1000 MG CAPS 1  tablet    . simvastatin (ZOCOR) 20 MG tablet Take 20 mg by mouth every evening.    . valACYclovir (VALTREX) 1000 MG tablet Take 1,000 mg by mouth 2 (two) times daily as needed (for 2 days for cold sores). Reported on 06/29/2015    . vitamin C (ASCORBIC ACID) 500 MG tablet Take 500 mg by mouth daily.     No current facility-administered medications for this visit.     SURGICAL HISTORY:  Past Surgical History:  Procedure Laterality Date  . ABDOMINAL HYSTERECTOMY  Feb 1997   TAH/BSO, pelvic and periaortic lymphadenectomy   . BACK SURGERY    . LYMPH NODE DISSECTION Left 08/01/2015    Procedure: LYMPH NODE DISSECTION;  Surgeon: Melrose Nakayama, MD;  Location: [facility];  Service: Thoracic;  Laterality: Left;  . PARTIAL NEPHRECTOMY     right  . SEGMENTECOMY Left 08/01/2015   Procedure: Left Upper Lobe SEGMENTECTOMY;  Surgeon: Melrose Nakayama, MD;  Location: [facility];  Service: Thoracic;  Laterality: Left;  . SKIN BIOPSY    . TUBAL LIGATION    . TUMOR REMOVAL     meningioma  T 12  . VIDEO ASSISTED THORACOSCOPY (VATS)/WEDGE RESECTION Left 08/01/2015   Procedure: VIDEO ASSISTED THORACOSCOPY (VATS)/WEDGE RESECTION;  Surgeon: Melrose Nakayama, MD;  Location: [facility];  Service: Thoracic;  Laterality: Left;    REVIEW OF SYSTEMS:  A comprehensive review of systems was negative except for: Behavioral/Psych: positive for anxiety   PHYSICAL EXAMINATION: General appearance: alert, cooperative and no distress Head: Normocephalic, without obvious abnormality, atraumatic Neck: no adenopathy, no JVD, supple, symmetrical, trachea midline and thyroid not enlarged, symmetric, no tenderness/mass/nodules Lymph nodes: Cervical, supraclavicular, and axillary nodes normal. Resp: clear to auscultation bilaterally Back: symmetric, no curvature. ROM normal. No CVA tenderness. Cardio: regular rate and rhythm, S1, S2 normal, no murmur, click, rub or gallop GI: soft, non-tender; bowel sounds normal; no masses,  no organomegaly Extremities: extremities normal, atraumatic, no cyanosis or edema  ECOG PERFORMANCE STATUS: 0 - Asymptomatic  Blood pressure (!) 152/69, pulse 66, temperature 98.9 F (37.2 C), temperature source Oral, resp. rate 18, height 5\' 2"  (1.575 m), weight 153 lb 6.4 oz (69.6 kg), SpO2 100 %.  LABORATORY DATA: Lab Results  Component Value Date   WBC 3.9 09/18/2016   HGB 11.5 (L) 09/18/2016   HCT 35.1 09/18/2016   MCV 67.9 (L) 09/18/2016   PLT 169 09/18/2016      Chemistry      Component Value Date/Time   NA 136 09/18/2016 1536   K 3.9 09/18/2016 1536   CL 98 (L)  08/06/2015 1146   CO2 26 09/18/2016 1536   BUN 15.8 09/18/2016 1536   CREATININE 0.8 09/18/2016 1536      Component Value Date/Time   CALCIUM 9.3 09/18/2016 1536   ALKPHOS 91 09/18/2016 1536   AST 18 09/18/2016 1536   ALT 17 09/18/2016 1536   BILITOT 0.42 09/18/2016 1536       RADIOGRAPHIC STUDIES: Ct Chest W Contrast  Result Date: 09/19/2016 CLINICAL DATA:  LEFT upper lobe partial lobectomy. Lung cancer diagnosed 2016. Personal history of GYN carcinoma. EXAM: CT CHEST WITH CONTRAST TECHNIQUE: Multidetector CT imaging of the chest was performed during intravenous contrast administration. CONTRAST:  45mL ISOVUE-300 IOPAMIDOL (ISOVUE-300) INJECTION 61% COMPARISON:  03/20/2016 FINDINGS: Cardiovascular: No significant vascular findings. Normal heart size. No pericardial effusion. Mediastinum/Nodes: No axillary supraclavicular adenopathy. No mediastinal hilar adenopathy. No pericardial fluid. Esophagus normal Lungs/Pleura: Postsurgical change in the  LEFT upper lobe. No new nodularity along the resection margin. LEFT lower lobe is clear. RIGHT lung is clear. Upper Abdomen: Limited view of the liver, kidneys, pancreas are unremarkable. Normal adrenal glands. Musculoskeletal: No aggressive osseous lesion. IMPRESSION: 1. Postsurgical change in the LEFT upper lobe without evidence local recurrence. 2. No evidence of metastatic disease in thorax. Electronically Signed   By: Suzy Bouchard M.D.   On: 09/19/2016 11:25    ASSESSMENT AND PLAN: This is a very pleasant 71 years old white female with history of stage IA non-small cell lung cancer status post lingular sparing left upper lobectomy with lymph node dissection in June 2017. The patient is currently on observation and she is feeling very well with no concerning complaints. She is very anxious about her scan results and her blood pressure is elevated today. The recent CT scan showed no evidence for disease recurrence or metastasis. I discussed the  scan results with the patient and recommended for her to continue on observation with repeat CT scan of the chest in 6 months. She was advised to call immediately if she has any concerning symptoms in the interval. She was advised to call immediately if she has any concerning symptoms in the interval.The patient voices understanding of current disease status and treatment options and is in agreement with the current care plan. All questions were answered. The patient knows to call the clinic with any problems, questions or concerns. We can certainly see the patient much sooner if necessary. I spent 10 minutes counseling the patient face to face. The total time spent in the appointment was 15 minutes.  Disclaimer: This note was dictated with voice recognition software. Similar sounding words can inadvertently be transcribed and may not be corrected upon review.

## 2016-09-25 ENCOUNTER — Ambulatory Visit: Payer: Medicare Other | Admitting: Internal Medicine

## 2016-10-07 ENCOUNTER — Encounter: Payer: Self-pay | Admitting: Thoracic Surgery (Cardiothoracic Vascular Surgery)

## 2016-10-07 ENCOUNTER — Ambulatory Visit (INDEPENDENT_AMBULATORY_CARE_PROVIDER_SITE_OTHER): Payer: Medicare Other | Admitting: Thoracic Surgery (Cardiothoracic Vascular Surgery)

## 2016-10-07 VITALS — BP 155/88 | HR 62 | Resp 20 | Ht 62.0 in | Wt 153.0 lb

## 2016-10-07 DIAGNOSIS — Z902 Acquired absence of lung [part of]: Secondary | ICD-10-CM

## 2016-10-07 DIAGNOSIS — C3412 Malignant neoplasm of upper lobe, left bronchus or lung: Secondary | ICD-10-CM

## 2016-10-07 NOTE — Progress Notes (Signed)
[name]       [place],[name] Richards [address]             [phone]     HPI: [name] Richards returns for one year follow-up visit.  She is a 71 year old woman with a history of vulvar cancer. She had a CT to stage her for that and was found to have a groundglass nodule in the left upper lobe. I did a lingular sparing left upper lobectomy in June 2017. Nodule was an adenocarcinoma, stage IA.  She did well postoperatively.  She continues to do well. She does not have any problems at all with her breathing. She is a lifelong nonsmoker. She does not have any residual pain and never had much pain to begin with. Her appetite is good, her weight is stable. She denies cough, wheezing, hemoptysis, chest pain, shortness of breath. Past Medical History:  Diagnosis Date  . 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 Asthma   . Cervical cancer ([place]) 1997   poorly differentiated cervical carcinoma. S/p TAH and radiation x6 wks   . Complication of anesthesia    took 2 days to wake up after 1 surgery       pt cants use a adult cath,needs peds. cath due to past injury  . Constipation    very worried about this  . Endometrial cancer ([place]) 1997   coincidental endometrial cancer also  . Hyperlipemia   . Hypertension   . Hypothyroidism   . Primary cancer of left upper lobe of lung ([facility]) 08/01/2015  . Small bowel obstruction ([facility])    12/2008, repeat SBO in 02/2011 also with terminal ilietis   . Thyroid disease   . Vertebral body hemangioma    T12    Current Outpatient Prescriptions  Medication Sig Dispense Refill  . acidophilus (RISAQUAD) CAPS Take 1 capsule by mouth daily.    Marland Kitchen albuterol (PROVENTIL HFA;VENTOLIN HFA) 108 (90 Base) MCG/ACT inhaler Inhale 2 puffs into the lungs every 6 (six) hours as needed for wheezing or shortness of breath. Reported on 06/29/2015    . benazepril-hydrochlorthiazide (LOTENSIN HCT) 20-12.5 MG per tablet Take 1 tablet by mouth daily.      . Biotin 10 MG TABS 1 tablet    . calcium elemental as carbonate (TUMS ULTRA 1000) 400 MG chewable tablet 1 tablet    . cholecalciferol (VITAMIN D) 1000 UNITS tablet Take 1,000 Units by mouth 2 (two) times daily.     . clobetasol ointment (TEMOVATE) 0.05 % Apply topically daily as needed.     . dicyclomine (BENTYL) 20 MG tablet Take 1 tablet (20 mg total) by mouth every 6 (six) hours as needed for spasms (for abdominal cramping). 20 tablet 0  . docusate sodium (COLACE) 100 MG capsule Take 100 mg by mouth 2 (two) times daily.    Marland Kitchen levothyroxine (SYNTHROID, LEVOTHROID) 75 MCG tablet Take 75 mcg by mouth daily.    Marland Kitchen loratadine (CLARITIN) 10 MG tablet Take 10 mg by mouth daily as needed for allergies.    . Magnesium 300 MG CAPS 1 capsule with a meal    . Multiple Vitamin (MULTI-VITAMINS) TABS as directed    . Omega-3 Fatty Acids (FISH OIL) 1000 MG CAPS 1 tablet    . simvastatin (ZOCOR) 20 MG tablet Take 20 mg by mouth every evening.    . vitamin C (ASCORBIC ACID) 500 MG tablet Take 500 mg by mouth daily.  No current facility-administered medications for this visit.     Physical Exam BP (!) 155/88   Pulse 62   Resp 20   Ht 5\' 2"  (1.575 m)   Wt 153 lb (69.4 kg)   SpO2 97% Comment: RA  BMI 27.6 kg/m  71 year old woman in no acute distress Alert and oriented 3 with no focal deficits No cervical or subclavicular adenopathy Cardiac regular rate and rhythm normal S1 and S2 Lungs clear with breath sounds bilaterally Incisions well healed  Diagnostic Tests: CT CHEST WITH CONTRAST  TECHNIQUE: Multidetector CT imaging of the chest was performed during intravenous contrast administration.  CONTRAST:  58mL ISOVUE-300 IOPAMIDOL (ISOVUE-300) INJECTION 61%  COMPARISON:  03/20/2016  FINDINGS: Cardiovascular: No significant vascular findings. Normal heart size. No pericardial effusion.  Mediastinum/Nodes: No axillary supraclavicular adenopathy. No mediastinal hilar adenopathy.  No pericardial fluid. Esophagus normal  Lungs/Pleura: Postsurgical change in the LEFT upper lobe. No new nodularity along the resection margin. LEFT lower lobe is clear. RIGHT lung is clear.  Upper Abdomen: Limited view of the liver, kidneys, pancreas are unremarkable. Normal adrenal glands.  Musculoskeletal: No aggressive osseous lesion.  IMPRESSION: 1. Postsurgical change in the LEFT upper lobe without evidence local recurrence. 2. No evidence of metastatic disease in thorax.   Electronically Signed   By: Suzy Bouchard M.D.   On: 09/19/2016 11:25 I personally reviewed the CT chest images and concur with the findings as noted above  Impression: [name] Richards is a 71 year old woman who had a lingular sparing left upper lobectomy for stage IA adenocarcinoma in June 2017. She is now a year out from surgery with no evidence of recurrent disease.  She does not have any pain or other issues related to her surgery. She feels well and is very physically active.  She is scheduled to see Dr. Julien Nordmann again in 6 months with a chest CT  Plan: She will follow-up with Dr. Julien Nordmann as scheduled.  I will be happy to see her back again at any time the future if I can be of any further assistance with her care  Melrose Nakayama, MD [facility] [phone]

## 2016-10-10 ENCOUNTER — Ambulatory Visit: Payer: Medicare Other | Admitting: Gynecology

## 2016-10-10 ENCOUNTER — Ambulatory Visit: Payer: Medicare Other | Attending: Gynecology | Admitting: Gynecology

## 2016-10-10 ENCOUNTER — Encounter: Payer: Self-pay | Admitting: Gynecology

## 2016-10-10 VITALS — BP 155/74 | HR 60 | Temp 98.5°F | Resp 20 | Ht 65.25 in | Wt 153.6 lb

## 2016-10-10 DIAGNOSIS — N904 Leukoplakia of vulva: Secondary | ICD-10-CM

## 2016-10-10 DIAGNOSIS — Z8542 Personal history of malignant neoplasm of other parts of uterus: Secondary | ICD-10-CM | POA: Diagnosis not present

## 2016-10-10 DIAGNOSIS — Y842 Radiological procedure and radiotherapy as the cause of abnormal reaction of the patient, or of later complication, without mention of misadventure at the time of the procedure: Secondary | ICD-10-CM | POA: Diagnosis not present

## 2016-10-10 DIAGNOSIS — I10 Essential (primary) hypertension: Secondary | ICD-10-CM | POA: Insufficient documentation

## 2016-10-10 DIAGNOSIS — Z79899 Other long term (current) drug therapy: Secondary | ICD-10-CM | POA: Diagnosis not present

## 2016-10-10 DIAGNOSIS — Z880 Allergy status to penicillin: Secondary | ICD-10-CM | POA: Insufficient documentation

## 2016-10-10 DIAGNOSIS — C3492 Malignant neoplasm of unspecified part of left bronchus or lung: Secondary | ICD-10-CM | POA: Diagnosis not present

## 2016-10-10 DIAGNOSIS — L280 Lichen simplex chronicus: Secondary | ICD-10-CM | POA: Diagnosis not present

## 2016-10-10 DIAGNOSIS — Z8544 Personal history of malignant neoplasm of other female genital organs: Secondary | ICD-10-CM

## 2016-10-10 DIAGNOSIS — K520 Gastroenteritis and colitis due to radiation: Secondary | ICD-10-CM | POA: Insufficient documentation

## 2016-10-10 DIAGNOSIS — Z885 Allergy status to narcotic agent status: Secondary | ICD-10-CM | POA: Diagnosis not present

## 2016-10-10 DIAGNOSIS — Z8541 Personal history of malignant neoplasm of cervix uteri: Secondary | ICD-10-CM | POA: Insufficient documentation

## 2016-10-10 DIAGNOSIS — K56600 Partial intestinal obstruction, unspecified as to cause: Secondary | ICD-10-CM | POA: Diagnosis not present

## 2016-10-10 DIAGNOSIS — E785 Hyperlipidemia, unspecified: Secondary | ICD-10-CM | POA: Diagnosis not present

## 2016-10-10 DIAGNOSIS — E039 Hypothyroidism, unspecified: Secondary | ICD-10-CM | POA: Diagnosis not present

## 2016-10-10 DIAGNOSIS — C519 Malignant neoplasm of vulva, unspecified: Secondary | ICD-10-CM | POA: Diagnosis present

## 2016-10-10 NOTE — Progress Notes (Signed)
Consult Note: Gyn-Onc   Rhunette Croft 71 y.o. female  Chief Complaint  Patient presents with  . 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 No evidence of disease.   Marland KitchenPast history of Poorly differentiated endometrial and cervical cancer 1997. No evidence of disease.  Intermittent partial small bowel obstruction (no recurrences over the past 6 months). 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 She'll call if she has any new symptoms or physical findings.  She's encouraged to stick with her modified diet as that seems to be working well.  . Interval History:  Patient returns today for continuing follow-up as scheduled. Since her last visit she's had no problems in the vulva.    She has been more vigilant with regard to her dietary modifications and is had no subsequent episodes of small bowel obstruction over the past 6 months.. She is planning a [place] cruise in April and a trip to [place] in September. She continues to work out consistently at [employer].   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 She underwent a total abdominal hysterectomy and bilateral salpingo-oophorectomy, pelvic and para-aortic lymphadenectomy. She received postoperative whole pelvis radiation therapy.  Her other primary gynecologic problem has been lichen sclerosus managed with when necessary clobetasol.  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  In December 2016 the patient was found to have a new primary squamous cell carcinoma the vulva undergoing a modified radical vulvectomy on 02/06/2015. She underwent a modified  radical vulvectomy at [facility] on 02/06/2015. She was found to have a 3 cm vulvar lesion of the posterior vulva extending into the posterior vagina. A rhomboid flap was required to close the incision. Patient had on pelvic postoperative course. She reports she's been doing well. She irrigates the wound twice a day with a tepid water and a blow dryer. She denies any drainage or pain or fever. She has been walking approximate half mile a day.  Final pathology showed some close surgical margins. However, the specimen margins were toward the time of surgery and it is my impression that we were widely around the primary cancer. I discussed these findings with the patient and we've agreed to monitor her closely but not recommend any radiation therapy to the vulva. The patient's agreement with this recommendation.   Review of Systems:10 point review of systems is negative except as noted in interval history.   Vitals: Blood pressure (!) 155/74, pulse 60, temperature 98.5 F (36.9 C), temperature source Oral, resp. rate 20, height 5' 5.25" (1.657 m), weight 153 lb 9.6 oz (69.7 kg), SpO2 100 %.  Physical Exam: General : The patient is a healthy woman in no acute distress.  HEENT: normocephalic, extraoccular movements normal; neck is supple without thyromegally  Lynphnodes: Supraclavicular and inguinal nodes not enlarged    Abdomen: Soft, non-tender, no ascites, no organomegally, no masses, no hernias  Pelvic:  EGBUS: Normal female mild lichen sclerosis on the anterior vulva.  The perineum is well-healed. There are no lesions.  Lower extremities: No edema or varicosities. Normal range of motion      Allergies  Allergen Reactions  . Codeine Other (See Comments)    Other  reaction(s): amnesia Reporting amnesia  . Penicillins Hives, Rash and Other (See Comments)    Has patient had a PCN reaction causing immediate rash, facial/tongue/throat swelling, SOB or lightheadedness with hypotension:  NO Has patient had a PCN reaction causing SEVERE RASH INVOLVING MUCUS MEMBRANES OR SKIN NECROSIS: YES Has patient had a PCN reaction that REQUIRED HOSPITALIZATION : patient denies hospitalization Has patient had a PCN reaction occurring within the last 10 years: NO    . Prochlorperazine Edisylate Other (See Comments)    JAWS LOCKED EXTRAPYRAMIDAL Rx  . Fentanyl Other (See Comments)    Other reaction(s): low blood pressure hypotension  . Percocet [Oxycodone-Acetaminophen] Other (See Comments)    Made pt feel real funny, never wanting to take it again; near syncopal episode  . Prochlorperazine     Other reaction(s): extrapyramidal reaction Other reaction(s): extrapyramidal reaction  . Penicillin G Sodium Rash    Other reaction(s): rash    Past Medical History:  Diagnosis Date  . 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 Asthma   . Cervical cancer ([place]) 1997   poorly differentiated cervical carcinoma. S/p TAH and radiation x6 wks   . Complication of anesthesia    took 2 days to wake up after 1 surgery       pt cants use a adult cath,needs peds. cath due to past injury  . Constipation    very worried about this  . Endometrial cancer ([place]) 1997   coincidental endometrial cancer also  . Hyperlipemia   . Hypertension   . Hypothyroidism   . Primary cancer of left upper lobe of lung ([place]) 08/01/2015  . Small bowel obstruction ([place])    12/2008, repeat SBO in 02/2011 also with terminal ilietis   . Thyroid disease   . Vertebral body hemangioma    T12    Past Surgical History:  Procedure Laterality Date  . ABDOMINAL HYSTERECTOMY  Feb 1997   TAH/BSO, pelvic and periaortic lymphadenectomy   . BACK SURGERY    . LYMPH NODE DISSECTION Left 08/01/2015   Procedure: LYMPH NODE DISSECTION;  Surgeon: Melrose Nakayama, MD;  Location: [place];  Service: Thoracic;  Laterality: Left;  . PARTIAL NEPHRECTOMY     right  . SEGMENTECOMY Left 08/01/2015   Procedure: Left Upper  Lobe SEGMENTECTOMY;  Surgeon: Melrose Nakayama, MD;  Location: [place];  Service: Thoracic;  Laterality: Left;  . SKIN BIOPSY    . TUBAL LIGATION    . TUMOR REMOVAL     meningioma  T 12  . VIDEO ASSISTED THORACOSCOPY (VATS)/WEDGE RESECTION Left 08/01/2015   Procedure: VIDEO ASSISTED THORACOSCOPY (VATS)/WEDGE RESECTION;  Surgeon: Melrose Nakayama, MD;  [facility];  Service: Thoracic;  Laterality: Left;    Current Outpatient Prescriptions  Medication Sig Dispense Refill  . acidophilus (RISAQUAD) CAPS Take 1 capsule by mouth daily.    Marland Kitchen albuterol (PROVENTIL HFA;VENTOLIN HFA) 108 (90 Base) MCG/ACT inhaler Inhale 2 puffs into the lungs every 6 (six) hours as needed for wheezing or shortness of breath. Reported on 06/29/2015    . benazepril-hydrochlorthiazide (LOTENSIN HCT) 20-12.5 MG per tablet Take 1 tablet by mouth daily.    . Biotin 10 MG TABS 1 tablet    . calcium elemental as carbonate (TUMS ULTRA 1000) 400 MG chewable tablet 1 tablet    . cholecalciferol (VITAMIN D) 1000 UNITS tablet Take 1,000 Units by mouth 2 (two) times daily.     . clobetasol ointment (TEMOVATE) 0.05 %  Apply topically daily as needed.     . dicyclomine (BENTYL) 20 MG tablet Take 1 tablet (20 mg total) by mouth every 6 (six) hours as needed for spasms (for abdominal cramping). 20 tablet 0  . docusate sodium (COLACE) 100 MG capsule Take 100 mg by mouth 2 (two) times daily.    Marland Kitchen levothyroxine (SYNTHROID, LEVOTHROID) 75 MCG tablet Take 75 mcg by mouth daily.    Marland Kitchen loratadine (CLARITIN) 10 MG tablet Take 10 mg by mouth daily as needed for allergies.    . Magnesium 300 MG CAPS 1 capsule with a meal    . Multiple Vitamin (MULTI-VITAMINS) TABS as directed    . Omega-3 Fatty Acids (FISH OIL) 1000 MG CAPS 1 tablet    . simvastatin (ZOCOR) 20 MG tablet Take 20 mg by mouth every evening.    . vitamin C (ASCORBIC ACID) 500 MG tablet Take 500 mg by mouth daily.     No current facility-administered medications for this  visit.     Social History   Social History  . Marital status: Divorced    Spouse name: N/A  . Number of children: N/A  . Years of education: N/A   Occupational History  . Not on file.   Social History Main Topics  . Smoking status: Never Smoker  . Smokeless tobacco: Never Used  . Alcohol use No  . Drug use: No  . Sexual activity: No   Other Topics Concern  . Not on file   Social History Narrative   Lives at home alone, works out at [employer] 6 days a week, still very active. Has an ex-husband who she is in contact with. Son died of an MI and another son in [place].     Family History  Problem Relation Age of Onset  . Hypertension Other   . Diabetes Other   . Stroke Other   . CAD Other       Marti Sleigh, MD 10/10/2016, 2:15 PM       Consult Note: Gyn-Onc   Rhunette Croft 71 y.o. female  Chief Complaint  Patient presents with  . 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 No evidence of disease. 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  Past history of Poorly differentiated endometrial and cervical cancer 1997. No evidence of disease.  Intermittent partial small bowel obstruction. Lichen sclerosis of the vulva (stable)  Plan:   We will have the patient back on October 8 for an outpatient (office) wide local excision of the hyperkeratotic lesion of the right vulva.  . Interval History:  Patient returns today for continuing follow-up as scheduled. Since her last visit she's had no problems in the vulva. She does note a slight amount of pulling when she is exercising at the [facility]. She denies any bleeding. I believe she has had no further small bowel obstruction episodes.   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 She underwent a  total abdominal hysterectomy and bilateral salpingo-oophorectomy, pelvic and para-aortic lymphadenectomy. She received postoperative whole pelvis radiation therapy.  Her other primary gynecologic problem has been lichen sclerosus managed with when necessary clobetasol.  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  In December 2016 the patient was found to  have a new primary squamous cell carcinoma the vulva undergoing a modified radical vulvectomy on 02/06/2015. She underwent a modified radical vulvectomy at [facility] on 02/06/2015. She was found to have a 3 cm vulvar lesion of the posterior vulva extending into the posterior vagina. A rhomboid flap was required to close the incision. Patient had on pelvic postoperative course. She reports she's been doing well. She irrigates the wound twice a day with a tepid water and a blow dryer. She denies any drainage or pain or fever. She has been walking approximate half mile a day.  Final pathology showed some close surgical margins. However, the specimen margins were toward the time of surgery and it is my impression that we were widely around the primary cancer. I discussed these findings with the patient and we've agreed to monitor her closely but not recommend any radiation therapy to the vulva. The patient's agreement with this recommendation.  She had a hyperkeratotic lesion of the right introitus biopsied in June 2018.   Review of Systems:10 point review of systems is negative except as noted in interval history.   Vitals: Blood pressure (!) 155/74, pulse 60, temperature 98.5 F (36.9 C), temperature source Oral, resp. rate 20, height 5' 5.25" (1.657 m), weight 153 lb 9.6 oz (69.7 kg), SpO2 100 %.  Physical Exam: General : The patient is a healthy woman in no acute distress.  HEENT: normocephalic, extraoccular movements normal; neck  is supple without thyromegally  Lynphnodes: Supraclavicular and inguinal nodes not enlarged    Abdomen: Soft, non-tender, no ascites, no organomegally, no masses, no hernias  Pelvic:  EGBUS: Normal female mild lichen sclerosis on the anterior vulva.  The perineum is well-healed. There is a 1 cm raised lesion that is not tender nor doesn't bleed in the mid aspect of the right introitus. This is the same area that I biopsied previously and confirmed hyperkeratosis. Lower extremities: No edema or varicosities. Normal range of motion      Allergies  Allergen Reactions  . Codeine Other (See Comments)    Other reaction(s): amnesia Reporting amnesia  . Penicillins Hives, Rash and Other (See Comments)    Has patient had a PCN reaction causing immediate rash, facial/tongue/throat swelling, SOB or lightheadedness with hypotension: NO Has patient had a PCN reaction causing SEVERE RASH INVOLVING MUCUS MEMBRANES OR SKIN NECROSIS: YES Has patient had a PCN reaction that REQUIRED HOSPITALIZATION : patient denies hospitalization Has patient had a PCN reaction occurring within the last 10 years: NO    . Prochlorperazine Edisylate Other (See Comments)    JAWS LOCKED EXTRAPYRAMIDAL Rx  . Fentanyl Other (See Comments)    Other reaction(s): low blood pressure hypotension  . Percocet [Oxycodone-Acetaminophen] Other (See Comments)    Made pt feel real funny, never wanting to take it again; near syncopal episode  . Prochlorperazine     Other reaction(s): extrapyramidal reaction Other reaction(s): extrapyramidal reaction  . Penicillin G Sodium Rash    Other reaction(s): rash    Past Medical History:  Diagnosis Date  . 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 Asthma   . Cervical cancer ([place]) 1997   poorly differentiated cervical carcinoma. S/p TAH and radiation x6 wks   . Complication of anesthesia    took 2 days to wake up after 1 surgery       pt cants use a adult  cath,needs peds. cath due to past injury  . Constipation    very  worried about this  . Endometrial cancer ([place]) 1997   coincidental endometrial cancer also  . Hyperlipemia   . Hypertension   . Hypothyroidism   . Primary cancer of left upper lobe of lung ([place]) 08/01/2015  . Small bowel obstruction ([place])    12/2008, repeat SBO in 02/2011 also with terminal ilietis   . Thyroid disease   . Vertebral body hemangioma    T12    Past Surgical History:  Procedure Laterality Date  . ABDOMINAL HYSTERECTOMY  Feb 1997   TAH/BSO, pelvic and periaortic lymphadenectomy   . BACK SURGERY    . LYMPH NODE DISSECTION Left 08/01/2015   Procedure: LYMPH NODE DISSECTION;  Surgeon: Melrose Nakayama, MD;  Location: [place];  Service: Thoracic;  Laterality: Left;  . PARTIAL NEPHRECTOMY     right  . SEGMENTECOMY Left 08/01/2015   Procedure: Left Upper Lobe SEGMENTECTOMY;  Surgeon: Melrose Nakayama, MD;  Location: [place];  Service: Thoracic;  Laterality: Left;  . SKIN BIOPSY    . TUBAL LIGATION    . TUMOR REMOVAL     meningioma  T 12  . VIDEO ASSISTED THORACOSCOPY (VATS)/WEDGE RESECTION Left 08/01/2015   Procedure: VIDEO ASSISTED THORACOSCOPY (VATS)/WEDGE RESECTION;  Surgeon: Melrose Nakayama, MD;  [facility];  Service: Thoracic;  Laterality: Left;    Current Outpatient Prescriptions  Medication Sig Dispense Refill  . acidophilus (RISAQUAD) CAPS Take 1 capsule by mouth daily.    Marland Kitchen albuterol (PROVENTIL HFA;VENTOLIN HFA) 108 (90 Base) MCG/ACT inhaler Inhale 2 puffs into the lungs every 6 (six) hours as needed for wheezing or shortness of breath. Reported on 06/29/2015    . benazepril-hydrochlorthiazide (LOTENSIN HCT) 20-12.5 MG per tablet Take 1 tablet by mouth daily.    . Biotin 10 MG TABS 1 tablet    . calcium elemental as carbonate (TUMS ULTRA 1000) 400 MG chewable tablet 1 tablet    . cholecalciferol (VITAMIN D) 1000 UNITS tablet Take 1,000 Units by mouth 2 (two) times daily.     . clobetasol  ointment (TEMOVATE) 0.05 % Apply topically daily as needed.     . dicyclomine (BENTYL) 20 MG tablet Take 1 tablet (20 mg total) by mouth every 6 (six) hours as needed for spasms (for abdominal cramping). 20 tablet 0  . docusate sodium (COLACE) 100 MG capsule Take 100 mg by mouth 2 (two) times daily.    Marland Kitchen levothyroxine (SYNTHROID, LEVOTHROID) 75 MCG tablet Take 75 mcg by mouth daily.    Marland Kitchen loratadine (CLARITIN) 10 MG tablet Take 10 mg by mouth daily as needed for allergies.    . Magnesium 300 MG CAPS 1 capsule with a meal    . Multiple Vitamin (MULTI-VITAMINS) TABS as directed    . Omega-3 Fatty Acids (FISH OIL) 1000 MG CAPS 1 tablet    . simvastatin (ZOCOR) 20 MG tablet Take 20 mg by mouth every evening.    . vitamin C (ASCORBIC ACID) 500 MG tablet Take 500 mg by mouth daily.     No current facility-administered medications for this visit.     Social History   Social History  . Marital status: Divorced    Spouse name: N/A  . Number of children: N/A  . Years of education: N/A   Occupational History  . Not on file.   Social History Main Topics  . Smoking status: Never Smoker  . Smokeless tobacco: Never Used  . Alcohol use No  . Drug use: No  . Sexual activity:  No   Other Topics Concern  . Not on file   Social History Narrative   Lives at home alone, works out at [employer] 6 days a week, still very active. Has an ex-husband who she is in contact with. Son died of an MI and another son in [place].     Family History  Problem Relation Age of Onset  . Hypertension Other   . Diabetes Other   . Stroke Other   . CAD Other       Marti Sleigh, MD 10/10/2016, 2:15 PM

## 2016-10-14 ENCOUNTER — Ambulatory Visit: Payer: Medicare Other | Admitting: Thoracic Surgery (Cardiothoracic Vascular Surgery)

## 2016-11-17 ENCOUNTER — Encounter: Payer: Self-pay | Admitting: Gynecology

## 2016-11-17 ENCOUNTER — Ambulatory Visit: Payer: Medicare Other | Attending: Gynecology | Admitting: Gynecology

## 2016-11-17 VITALS — BP 158/73 | HR 63 | Temp 98.7°F | Resp 20 | Ht 65.25 in | Wt 155.5 lb

## 2016-11-17 DIAGNOSIS — Z8542 Personal history of malignant neoplasm of other parts of uterus: Secondary | ICD-10-CM | POA: Diagnosis not present

## 2016-11-17 DIAGNOSIS — I10 Essential (primary) hypertension: Secondary | ICD-10-CM | POA: Diagnosis not present

## 2016-11-17 DIAGNOSIS — Z8249 Family history of ischemic heart disease and other diseases of the circulatory system: Secondary | ICD-10-CM | POA: Insufficient documentation

## 2016-11-17 DIAGNOSIS — J45909 Unspecified asthma, uncomplicated: Secondary | ICD-10-CM | POA: Insufficient documentation

## 2016-11-17 DIAGNOSIS — Z85118 Personal history of other malignant neoplasm of bronchus and lung: Secondary | ICD-10-CM | POA: Diagnosis not present

## 2016-11-17 DIAGNOSIS — Z79899 Other long term (current) drug therapy: Secondary | ICD-10-CM | POA: Diagnosis not present

## 2016-11-17 DIAGNOSIS — Z880 Allergy status to penicillin: Secondary | ICD-10-CM | POA: Diagnosis not present

## 2016-11-17 DIAGNOSIS — E785 Hyperlipidemia, unspecified: Secondary | ICD-10-CM | POA: Diagnosis not present

## 2016-11-17 DIAGNOSIS — Z8541 Personal history of malignant neoplasm of cervix uteri: Secondary | ICD-10-CM | POA: Diagnosis not present

## 2016-11-17 DIAGNOSIS — L900 Lichen sclerosus et atrophicus: Secondary | ICD-10-CM | POA: Insufficient documentation

## 2016-11-17 DIAGNOSIS — Z90710 Acquired absence of both cervix and uterus: Secondary | ICD-10-CM | POA: Diagnosis not present

## 2016-11-17 DIAGNOSIS — E039 Hypothyroidism, unspecified: Secondary | ICD-10-CM | POA: Insufficient documentation

## 2016-11-17 DIAGNOSIS — Z905 Acquired absence of kidney: Secondary | ICD-10-CM | POA: Diagnosis not present

## 2016-11-17 DIAGNOSIS — N904 Leukoplakia of vulva: Secondary | ICD-10-CM | POA: Diagnosis not present

## 2016-11-17 DIAGNOSIS — N9089 Other specified noninflammatory disorders of vulva and perineum: Secondary | ICD-10-CM | POA: Diagnosis present

## 2016-11-17 DIAGNOSIS — Z888 Allergy status to other drugs, medicaments and biological substances status: Secondary | ICD-10-CM | POA: Insufficient documentation

## 2016-11-17 DIAGNOSIS — Z90722 Acquired absence of ovaries, bilateral: Secondary | ICD-10-CM | POA: Diagnosis not present

## 2016-11-17 DIAGNOSIS — Z8544 Personal history of malignant neoplasm of other female genital organs: Secondary | ICD-10-CM | POA: Diagnosis not present

## 2016-11-17 DIAGNOSIS — Z885 Allergy status to narcotic agent status: Secondary | ICD-10-CM | POA: Insufficient documentation

## 2016-11-17 DIAGNOSIS — Z923 Personal history of irradiation: Secondary | ICD-10-CM | POA: Diagnosis not present

## 2016-11-17 DIAGNOSIS — C519 Malignant neoplasm of vulva, unspecified: Secondary | ICD-10-CM | POA: Insufficient documentation

## 2016-11-17 NOTE — Progress Notes (Signed)
Consult Note: Gyn-Onc   [name] Richards 71 y.o. female  Chief Complaint  Patient presents with  . 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     [name] KitchenPast history of Poorly differentiated endometrial and cervical cancer 1997. No evidence of disease.  Intermittent partial small bowel obstruction (no recurrences over the past 6 months). 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 Postoperative care to the wound is given to the patient. She will return to see me in early December or as needed. We'll contact her with the pathology report..  . Interval History:  Patient returns today for excision of the new lesion of the right introitus.[name] Richards   HPI:The patient initially presented with a poorly differentiated endometrial carcinoma and endocervical adenocarcinoma in February of 1997. She underwent a total abdominal hysterectomy and bilateral salpingo-oophorectomy, pelvic and para-aortic lymphadenectomy. She received postoperative whole pelvis radiation therapy.  Her other primary gynecologic problem has been lichen sclerosus managed with when necessary clobetasol.  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  In December 2016 the patient was found to have a new primary squamous cell carcinoma the vulva undergoing a modified radical vulvectomy on 02/06/2015. She underwent a modified radical vulvectomy at [facility] on 02/06/2015. She was found to have a 3 cm vulvar lesion of the posterior vulva extending into the posterior vagina. A rhomboid flap was required to close the incision. Patient had on pelvic postoperative course. She reports she's been doing well. She irrigates the wound  twice a day with a tepid water and a blow dryer. She denies any drainage or pain or fever. She has been walking approximate half mile a day.  Final pathology showed some close surgical margins. However, the specimen margins were toward the time of surgery and it is my impression that we were widely around the primary cancer. I discussed these findings with the patient and we've agreed to monitor her closely but not recommend any radiation therapy to the vulva. The patient's agreement with this recommendation.   Review of Systems:10 point review of systems is negative except as noted in interval history.   Vitals: Blood pressure (!) 158/73, pulse 63, temperature 98.7 F (37.1 C), temperature source Oral, resp. rate 20, height 5' 5.25" (1.657 m), weight 155 lb 8 oz (70.5 kg), SpO2 99 %.  Physical Exam: General : The patient is a healthy woman in no acute distress.  HEENT: normocephalic, extraoccular movements normal; neck is supple without thyromegally  Lynphnodes: Supraclavicular and inguinal nodes not enlarged    Abdomen: Soft, non-tender, no ascites, no organomegally, no masses, no hernias  Pelvic:  EGBUS: Normal female mild lichen sclerosis on the anterior vulva.  The perineum is well-healed. There are no lesions on the vulva.  At approximately 9:00 at the introitus is a raised 1.5 cm lesion which is excised..  Lower extremities: No edema or varicosities. Normal range of motion   Procedure note: The vulva was prepped with Betadine and the lesion infiltrated with 1% lidocaine with epinephrine. Using a scalpel the lesion is excised without difficulty. Skin edges are reapproximated with a running 3-0 Vicryl suture. Additional hemostasis achieved with silver nitrate. Specimen submitted to pathology.     Allergies  Allergen Reactions  . Codeine Other (See Comments)    Other reaction(s): amnesia Reporting amnesia  . Penicillins Hives, Rash and Other (See Comments)    Has patient had a  PCN reaction causing immediate rash, facial/tongue/throat swelling, SOB or lightheadedness with hypotension: NO Has patient had a PCN reaction causing SEVERE RASH INVOLVING MUCUS MEMBRANES OR SKIN NECROSIS: YES Has patient had a PCN reaction that REQUIRED HOSPITALIZATION : patient denies hospitalization Has patient had a PCN reaction occurring within the last 10 years: NO    . Prochlorperazine Edisylate Other (See Comments)    JAWS LOCKED EXTRAPYRAMIDAL Rx  . Fentanyl Other (See Comments)    Other reaction(s): low blood pressure hypotension  . Percocet [Oxycodone-Acetaminophen] Other (See Comments)    Made pt feel real funny, never wanting to take it again; near syncopal episode  . Prochlorperazine     Other reaction(s): extrapyramidal reaction Other reaction(s): extrapyramidal reaction  . Penicillin G Sodium Rash    Other reaction(s): rash    Past Medical History:  Diagnosis Date  . 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 Asthma   . Cervical cancer ([place]) 1997   poorly differentiated cervical carcinoma. S/p TAH and radiation x6 wks   . Complication of anesthesia    took 2 days to wake up after 1 surgery       pt cants use a adult cath,needs peds. cath due to past injury  . Constipation    very worried about this  . Endometrial cancer ([place]) 1997   coincidental endometrial cancer also  . Hyperlipemia   . Hypertension   . Hypothyroidism   . Primary cancer of left upper lobe of lung ([place]) 08/01/2015  . Small bowel obstruction ([place])    12/2008, repeat SBO in 02/2011 also with terminal ilietis   . Thyroid disease   . Vertebral body hemangioma    T12    Past Surgical History:  Procedure Laterality Date  . ABDOMINAL HYSTERECTOMY  Feb 1997   TAH/BSO, pelvic and periaortic lymphadenectomy   . BACK SURGERY    . LYMPH NODE DISSECTION Left 08/01/2015   Procedure: LYMPH NODE DISSECTION;  Surgeon: Melrose Nakayama, MD;  Location: [place];  Service: Thoracic;   Laterality: Left;  . PARTIAL NEPHRECTOMY     right  . SEGMENTECOMY Left 08/01/2015   Procedure: Left Upper Lobe SEGMENTECTOMY;  Surgeon: Melrose Nakayama, MD;  Location: [place];  Service: Thoracic;  Laterality: Left;  . SKIN BIOPSY    . TUBAL LIGATION    . TUMOR REMOVAL     meningioma  T 12  . VIDEO ASSISTED THORACOSCOPY (VATS)/WEDGE RESECTION Left 08/01/2015   Procedure: VIDEO ASSISTED THORACOSCOPY (VATS)/WEDGE RESECTION;  Surgeon: Melrose Nakayama, MD;  [facility];  Service: Thoracic;  Laterality: Left;    Current Outpatient Prescriptions  Medication Sig Dispense Refill  . acidophilus (RISAQUAD) CAPS Take 1 capsule by mouth daily.    [name] Richards albuterol (PROVENTIL HFA;VENTOLIN HFA) 108 (90 Base) MCG/ACT inhaler Inhale 2 puffs into the lungs every 6 (six) hours as needed for wheezing or shortness of breath. Reported on 06/29/2015    . benazepril-hydrochlorthiazide (LOTENSIN HCT) 20-12.5 MG per tablet Take 1 tablet by mouth daily.    . Biotin 10 MG TABS 1 tablet    . calcium elemental as carbonate (TUMS ULTRA 1000) 400 MG chewable tablet 1 tablet    . cholecalciferol (VITAMIN D) 1000 UNITS tablet Take 1,000 Units by mouth 2 (  two) times daily.     . clobetasol ointment (TEMOVATE) 0.05 % Apply topically daily as needed.     . dicyclomine (BENTYL) 20 MG tablet Take 1 tablet (20 mg total) by mouth every 6 (six) hours as needed for spasms (for abdominal cramping). 20 tablet 0  . docusate sodium (COLACE) 100 MG capsule Take 100 mg by mouth 2 (two) times daily.    [name] Richards levothyroxine (SYNTHROID, LEVOTHROID) 75 MCG tablet Take 75 mcg by mouth daily.    [name] Richards loratadine (CLARITIN) 10 MG tablet Take 10 mg by mouth daily as needed for allergies.    . Magnesium 300 MG CAPS 1 capsule with a meal    . Multiple Vitamin (MULTI-VITAMINS) TABS as directed    . Omega-3 Fatty Acids (FISH OIL) 1000 MG CAPS 1 tablet    . simvastatin (ZOCOR) 20 MG tablet Take 20 mg by mouth every evening.    . vitamin C (ASCORBIC  ACID) 500 MG tablet Take 500 mg by mouth daily.     No current facility-administered medications for this visit.     Social History   Social History  . Marital status: Divorced    Spouse name: N/A  . Number of children: N/A  . Years of education: N/A   Occupational History  . Not on file.   Social History Main Topics  . Smoking status: Never Smoker  . Smokeless tobacco: Never Used  . Alcohol use No  . Drug use: No  . Sexual activity: No   Other Topics Concern  . Not on file   Social History Narrative   Lives at home alone, works out at [employer] 6 days a week, still very active. Has an ex-husband who she is in contact with. Son died of an MI and another son in [place].     Family History  Problem Relation Age of Onset  . Hypertension Other   . Diabetes Other   . Stroke Other   . CAD Other       Marti Sleigh, MD 11/17/2016, 12:31 PM       Consult Note: Gyn-Onc   [name] Richards 71 y.o. female  Chief Complaint  Patient presents with  . 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 No evidence of disease. 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  Past history of Poorly differentiated endometrial and cervical cancer 1997. No evidence of disease.  Intermittent partial small bowel obstruction. Lichen sclerosis of the vulva (stable)  Plan:   We will have the patient back on October 8 for an outpatient (office) wide local excision of the hyperkeratotic lesion of the right vulva.  . Interval History:  Patient returns today for continuing follow-up as scheduled. Since her last visit she's had no problems in the vulva. She does note a slight amount of pulling when she is exercising at the [facility]. She denies any bleeding. I believe she has had no further small bowel obstruction episodes.   HPI:The patient initially presented with a  poorly differentiated endometrial carcinoma and endocervical adenocarcinoma in February of 1997. She underwent a total abdominal hysterectomy and bilateral salpingo-oophorectomy, pelvic and para-aortic lymphadenectomy. She received postoperative whole pelvis radiation therapy.  Her other primary gynecologic problem has been lichen sclerosus managed with when necessary clobetasol.  Beginning in 2013 the patient had intermittent episodes of partial small bowel obstruction most likely secondary to radiation injury  to the terminal ileum.  In December 2016 the patient was found to have a new primary squamous cell carcinoma the vulva undergoing a modified radical vulvectomy on 02/06/2015. She underwent a modified radical vulvectomy at [facility] on 02/06/2015. She was found to have a 3 cm vulvar lesion of the posterior vulva extending into the posterior vagina. A rhomboid flap was required to close the incision. Patient had on pelvic postoperative course. She reports she's been doing well. She irrigates the wound twice a day with a tepid water and a blow dryer. She denies any drainage or pain or fever. She has been walking approximate half mile a day.  Final pathology showed some close surgical margins. However, the specimen margins were toward the time of surgery and it is my impression that we were widely around the primary cancer. I discussed these findings with the patient and we've agreed to monitor her closely but not recommend any radiation therapy to the vulva. The patient's agreement with this recommendation.  She had a hyperkeratotic lesion of the right introitus biopsied in June 2018.   Review of Systems:10 point review of systems is negative except as noted in interval history.   Vitals: Blood pressure (!) 158/73, pulse 63, temperature 98.7 F (37.1 C), temperature source Oral, resp. rate 20, height 5' 5.25" (1.657 m), weight 155 lb 8 oz (70.5 kg), SpO2 99 %.  Physical Exam: General : The  patient is a healthy woman in no acute distress.  HEENT: normocephalic, extraoccular movements normal; neck is supple without thyromegally  Lynphnodes: Supraclavicular and inguinal nodes not enlarged    Abdomen: Soft, non-tender, no ascites, no organomegally, no masses, no hernias  Pelvic:  EGBUS: Normal female mild lichen sclerosis on the anterior vulva.  The perineum is well-healed. There is a 1 cm raised lesion that is not tender nor doesn't bleed in the mid aspect of the right introitus. This is the same area that I biopsied previously and confirmed hyperkeratosis. Lower extremities: No edema or varicosities. Normal range of motion      Allergies  Allergen Reactions  . Codeine Other (See Comments)    Other reaction(s): amnesia Reporting amnesia  . Penicillins Hives, Rash and Other (See Comments)    Has patient had a PCN reaction causing immediate rash, facial/tongue/throat swelling, SOB or lightheadedness with hypotension: NO Has patient had a PCN reaction causing SEVERE RASH INVOLVING MUCUS MEMBRANES OR SKIN NECROSIS: YES Has patient had a PCN reaction that REQUIRED HOSPITALIZATION : patient denies hospitalization Has patient had a PCN reaction occurring within the last 10 years: NO    . Prochlorperazine Edisylate Other (See Comments)    JAWS LOCKED EXTRAPYRAMIDAL Rx  . Fentanyl Other (See Comments)    Other reaction(s): low blood pressure hypotension  . Percocet [Oxycodone-Acetaminophen] Other (See Comments)    Made pt feel real funny, never wanting to take it again; near syncopal episode  . Prochlorperazine     Other reaction(s): extrapyramidal reaction Other reaction(s): extrapyramidal reaction  . Penicillin G Sodium Rash    Other reaction(s): rash    Past Medical History:  Diagnosis Date  . 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 Asthma   . Cervical cancer ([place]) 1997   poorly differentiated cervical carcinoma. S/p TAH and radiation x6 wks    . Complication of anesthesia    took 2 days to wake up after 1 surgery       pt cants use a adult cath,needs  peds. cath due to past injury  . Constipation    very worried about this  . Endometrial cancer ([place]) 1997   coincidental endometrial cancer also  . Hyperlipemia   . Hypertension   . Hypothyroidism   . Primary cancer of left upper lobe of lung ([place]) 08/01/2015  . Small bowel obstruction ([place])    12/2008, repeat SBO in 02/2011 also with terminal ilietis   . Thyroid disease   . Vertebral body hemangioma    T12    Past Surgical History:  Procedure Laterality Date  . ABDOMINAL HYSTERECTOMY  Feb 1997   TAH/BSO, pelvic and periaortic lymphadenectomy   . BACK SURGERY    . LYMPH NODE DISSECTION Left 08/01/2015   Procedure: LYMPH NODE DISSECTION;  Surgeon: Melrose Nakayama, MD;  Location: [place];  Service: Thoracic;  Laterality: Left;  . PARTIAL NEPHRECTOMY     right  . SEGMENTECOMY Left 08/01/2015   Procedure: Left Upper Lobe SEGMENTECTOMY;  Surgeon: Melrose Nakayama, MD;  Location: [place];  Service: Thoracic;  Laterality: Left;  . SKIN BIOPSY    . TUBAL LIGATION    . TUMOR REMOVAL     meningioma  T 12  . VIDEO ASSISTED THORACOSCOPY (VATS)/WEDGE RESECTION Left 08/01/2015   Procedure: VIDEO ASSISTED THORACOSCOPY (VATS)/WEDGE RESECTION;  Surgeon: Melrose Nakayama, MD;  [facility];  Service: Thoracic;  Laterality: Left;    Current Outpatient Prescriptions  Medication Sig Dispense Refill  . acidophilus (RISAQUAD) CAPS Take 1 capsule by mouth daily.    [name] Richards albuterol (PROVENTIL HFA;VENTOLIN HFA) 108 (90 Base) MCG/ACT inhaler Inhale 2 puffs into the lungs every 6 (six) hours as needed for wheezing or shortness of breath. Reported on 06/29/2015    . benazepril-hydrochlorthiazide (LOTENSIN HCT) 20-12.5 MG per tablet Take 1 tablet by mouth daily.    . Biotin 10 MG TABS 1 tablet    . calcium elemental as carbonate (TUMS ULTRA 1000) 400 MG chewable tablet 1 tablet    .  cholecalciferol (VITAMIN D) 1000 UNITS tablet Take 1,000 Units by mouth 2 (two) times daily.     . clobetasol ointment (TEMOVATE) 0.05 % Apply topically daily as needed.     . dicyclomine (BENTYL) 20 MG tablet Take 1 tablet (20 mg total) by mouth every 6 (six) hours as needed for spasms (for abdominal cramping). 20 tablet 0  . docusate sodium (COLACE) 100 MG capsule Take 100 mg by mouth 2 (two) times daily.    [name] Richards levothyroxine (SYNTHROID, LEVOTHROID) 75 MCG tablet Take 75 mcg by mouth daily.    [name] Richards loratadine (CLARITIN) 10 MG tablet Take 10 mg by mouth daily as needed for allergies.    . Magnesium 300 MG CAPS 1 capsule with a meal    . Multiple Vitamin (MULTI-VITAMINS) TABS as directed    . Omega-3 Fatty Acids (FISH OIL) 1000 MG CAPS 1 tablet    . simvastatin (ZOCOR) 20 MG tablet Take 20 mg by mouth every evening.    . vitamin C (ASCORBIC ACID) 500 MG tablet Take 500 mg by mouth daily.     No current facility-administered medications for this visit.     Social History   Social History  . Marital status: Divorced    Spouse name: N/A  . Number of children: N/A  . Years of education: N/A   Occupational History  . Not on file.   Social History Main Topics  . Smoking status: Never Smoker  . Smokeless tobacco: Never Used  .  Alcohol use No  . Drug use: No  . Sexual activity: No   Other Topics Concern  . Not on file   Social History Narrative   Lives at home alone, works out at [employer] 6 days a week, still very active. Has an ex-husband who she is in contact with. Son died of an MI and another son in [place].     Family History  Problem Relation Age of Onset  . Hypertension Other   . Diabetes Other   . Stroke Other   . CAD Other       Marti Sleigh, MD 11/17/2016, 12:31 PM

## 2016-11-17 NOTE — Patient Instructions (Signed)
We will contact you with the results of your biopsy from today.  Plan on following up in December or sooner if needed.  Please call for any needs or concerns.

## 2016-11-17 NOTE — Addendum Note (Signed)
Addended by: Joylene John D on: 11/17/2016 02:10 PM   Modules accepted: Orders

## 2017-01-13 HISTORY — PX: VULVECTOMY PARTIAL: SHX6187

## 2017-01-16 ENCOUNTER — Telehealth: Payer: Self-pay | Admitting: Gynecology

## 2017-01-16 NOTE — Telephone Encounter (Signed)
I contact the patient to review her pathology report from her recent surgery.  The pathologist found invasive squamous cell carcinoma but also noted that all margins were negative.  The patient reports she is healing well and has no pain bleeding or discharge.  She is scheduled to see me next Monday.

## 2017-01-19 ENCOUNTER — Ambulatory Visit: Payer: Medicare Other | Admitting: Gynecology

## 2017-01-23 ENCOUNTER — Encounter: Payer: Self-pay | Admitting: Gynecology

## 2017-01-23 ENCOUNTER — Ambulatory Visit: Payer: Medicare Other | Attending: Gynecology | Admitting: Gynecology

## 2017-01-23 VITALS — BP 132/73 | HR 65 | Temp 98.9°F | Resp 18 | Ht 62.25 in | Wt 155.9 lb

## 2017-01-23 DIAGNOSIS — Z85118 Personal history of other malignant neoplasm of bronchus and lung: Secondary | ICD-10-CM | POA: Insufficient documentation

## 2017-01-23 DIAGNOSIS — Z9889 Other specified postprocedural states: Secondary | ICD-10-CM | POA: Diagnosis not present

## 2017-01-23 DIAGNOSIS — Z79899 Other long term (current) drug therapy: Secondary | ICD-10-CM | POA: Diagnosis not present

## 2017-01-23 DIAGNOSIS — Z90722 Acquired absence of ovaries, bilateral: Secondary | ICD-10-CM | POA: Diagnosis not present

## 2017-01-23 DIAGNOSIS — I10 Essential (primary) hypertension: Secondary | ICD-10-CM | POA: Insufficient documentation

## 2017-01-23 DIAGNOSIS — Z90710 Acquired absence of both cervix and uterus: Secondary | ICD-10-CM | POA: Diagnosis not present

## 2017-01-23 DIAGNOSIS — C519 Malignant neoplasm of vulva, unspecified: Secondary | ICD-10-CM

## 2017-01-23 DIAGNOSIS — Z9079 Acquired absence of other genital organ(s): Secondary | ICD-10-CM | POA: Diagnosis not present

## 2017-01-23 DIAGNOSIS — Z8541 Personal history of malignant neoplasm of cervix uteri: Secondary | ICD-10-CM | POA: Insufficient documentation

## 2017-01-23 DIAGNOSIS — J45909 Unspecified asthma, uncomplicated: Secondary | ICD-10-CM | POA: Insufficient documentation

## 2017-01-23 DIAGNOSIS — E785 Hyperlipidemia, unspecified: Secondary | ICD-10-CM | POA: Insufficient documentation

## 2017-01-23 DIAGNOSIS — E039 Hypothyroidism, unspecified: Secondary | ICD-10-CM | POA: Diagnosis not present

## 2017-01-23 NOTE — Patient Instructions (Signed)
Plan on having a PET scan in Feb and see Dr. Fermin Schwab after.  Please call for any questions or concerns or new symptoms.

## 2017-01-23 NOTE — Progress Notes (Signed)
Consult Note: Gyn-Onc   [name] Richards 71 y.o. female  Chief Complaint  Patient presents with  . Vulvar cancer [facility])    Assessment : Recurrent vulvar cancer most recently having undergone a small modified radical vulvectomy of the right side on January 13, 2017. Prior vulvar cancer status post modified posterior radical vulvectomy on 02/06/2015.    Past history of Poorly differentiated endometrial and cervical cancer 1997. No evidence of disease.  Intermittent partial small bowel obstruction (no recurrences over the past 6 months). 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  She will return to see me in mid February and prior to that visit we will obtain a PET scan to evaluate for the possibility of metastatic disease in lymph nodes.  (We are deferring the PET scan at the present time to allow for healing and resolution of any inflammatory response)  . Interval History:   Patient returns today for initial postoperative check.  She underwent a right modified radical vulvectomy on December 4 at [facility].  She has had no complications since surgery.  She denies any bleeding discharge fever or pain.Marland Kitchen   HPI:The patient initially presented with a poorly differentiated endometrial carcinoma and endocervical adenocarcinoma in February of 1997. She underwent a total abdominal hysterectomy and bilateral salpingo-oophorectomy, pelvic and para-aortic lymphadenectomy. She received postoperative whole pelvis radiation therapy.  Her other primary gynecologic problem has been lichen sclerosus managed with when necessary clobetasol.  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  In December 2016 the patient was found  to have a new primary squamous cell carcinoma the vulva undergoing a modified radical vulvectomy on 02/06/2015. She underwent a modified radical vulvectomy at [facility] on 02/06/2015. She was found to have a 3 cm vulvar lesion of the posterior vulva extending into the posterior vagina. A rhomboid flap was required to close the incision. Patient had on pelvic postoperative course. She reports she's been doing well. She irrigates the wound twice a day with a tepid water and a blow dryer. She denies any drainage or pain or fever. She has been walking approximate half mile a day.  Final pathology showed some close surgical margins. However, the specimen margins were toward the time of surgery and it is my impression that we were widely around the primary cancer. I discussed these findings with the patient and we've agreed to monitor her closely but not recommend any radiation therapy to the vulva. The patient's agreement with this recommendation.  Recurrent vulvar cancer October 2018 managed by a small modified radical vulvectomy of the right side on January 13, 2017.  Depth of invasion was 4 mm all margins were negative.   Review of Systems:10 point review of systems is negative except as noted in interval history.   Vitals: Blood pressure 132/73, pulse 65, temperature 98.9 F (37.2 C), temperature source Oral, resp. rate 18, height 5' 2.25" (1.581 m), weight 155 lb 14.4 oz (70.7 kg), SpO2 100 %.  Physical Exam: General : The patient is a healthy woman in no acute distress.  HEENT: normocephalic, extraoccular movements normal; neck is supple without thyromegally  Lynphnodes: Supraclavicular and inguinal nodes not enlarged  Abdomen: Soft, non-tender, no ascites, no organomegally, no masses, no hernias  Pelvic:  EGBUS: Normal female mild lichen sclerosis on the anterior vulva.  The perineum is well-healed. There are no lesions on the vulva.  The longitudinal incision along the right vulva near the  introitus is healing well.   Allergies  Allergen Reactions  . Codeine Other (See Comments)    Other reaction(s): amnesia Reporting amnesia  . Penicillins Hives, Rash and Other (See Comments)    Has patient had a PCN reaction causing immediate rash, facial/tongue/throat swelling, SOB or lightheadedness with hypotension: NO Has patient had a PCN reaction causing SEVERE RASH INVOLVING MUCUS MEMBRANES OR SKIN NECROSIS: YES Has patient had a PCN reaction that REQUIRED HOSPITALIZATION : patient denies hospitalization Has patient had a PCN reaction occurring within the last 10 years: NO    . Prochlorperazine Edisylate Other (See Comments)    JAWS LOCKED EXTRAPYRAMIDAL Rx  . Fentanyl Other (See Comments)    Other reaction(s): low blood pressure hypotension  . Percocet [Oxycodone-Acetaminophen] Other (See Comments)    Made pt feel real funny, never wanting to take it again; near syncopal episode  . Prochlorperazine     Other reaction(s): extrapyramidal reaction Other reaction(s): extrapyramidal reaction  . Penicillin G Sodium Rash    Other reaction(s): rash    Past Medical History:  Diagnosis Date  . 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 Asthma   . Cervical cancer ([place]) 1997   poorly differentiated cervical carcinoma. S/p TAH and radiation x6 wks   . Complication of anesthesia    took 2 days to wake up after 1 surgery       pt cants use a adult cath,needs peds. cath due to past injury  . Constipation    very worried about this  . Endometrial cancer ([place]) 1997   coincidental endometrial cancer also  . Hyperlipemia   . Hypertension   . Hypothyroidism   . Primary cancer of left upper lobe of lung ([place]) 08/01/2015  . Small bowel obstruction ([place])    12/2008, repeat SBO in 02/2011 also with terminal ilietis   . Thyroid disease   . Vertebral body hemangioma    T12    Past Surgical History:  Procedure Laterality Date  . ABDOMINAL HYSTERECTOMY  Feb 1997    TAH/BSO, pelvic and periaortic lymphadenectomy   . BACK SURGERY    . LYMPH NODE DISSECTION Left 08/01/2015   Procedure: LYMPH NODE DISSECTION;  Surgeon: Melrose Nakayama, MD;  Location: [place];  Service: Thoracic;  Laterality: Left;  . PARTIAL NEPHRECTOMY     right  . SEGMENTECOMY Left 08/01/2015   Procedure: Left Upper Lobe SEGMENTECTOMY;  Surgeon: Melrose Nakayama, MD;  Location: [place];  Service: Thoracic;  Laterality: Left;  . SKIN BIOPSY    . TUBAL LIGATION    . TUMOR REMOVAL     meningioma  T 12  . VIDEO ASSISTED THORACOSCOPY (VATS)/WEDGE RESECTION Left 08/01/2015   Procedure: VIDEO ASSISTED THORACOSCOPY (VATS)/WEDGE RESECTION;  Surgeon: Melrose Nakayama, MD;  Location: [facility];  Service: Thoracic;  Laterality: Left;    Current Outpatient Medications  Medication Sig Dispense Refill  . acidophilus (RISAQUAD) CAPS Take 1 capsule by mouth daily.    Marland Kitchen albuterol (PROVENTIL HFA;VENTOLIN HFA) 108 (90 Base) MCG/ACT inhaler Inhale 2 puffs into the lungs every 6 (six) hours as needed for wheezing or shortness of breath. Reported on 06/29/2015    . benazepril-hydrochlorthiazide (LOTENSIN HCT) 20-12.5 MG per tablet Take 1 tablet by mouth daily.    Marland Kitchen  Biotin 10 MG TABS 1 tablet    . calcium elemental as carbonate (TUMS ULTRA 1000) 400 MG chewable tablet 1 tablet    . cholecalciferol (VITAMIN D) 1000 UNITS tablet Take 1,000 Units by mouth 2 (two) times daily.     . clobetasol ointment (TEMOVATE) 0.05 % Apply topically daily as needed.     . dicyclomine (BENTYL) 20 MG tablet Take 1 tablet (20 mg total) by mouth every 6 (six) hours as needed for spasms (for abdominal cramping). 20 tablet 0  . docusate sodium (COLACE) 100 MG capsule Take 100 mg by mouth 2 (two) times daily.    Marland Kitchen levothyroxine (SYNTHROID, LEVOTHROID) 75 MCG tablet Take 75 mcg by mouth daily.    Marland Kitchen loratadine (CLARITIN) 10 MG tablet Take 10 mg by mouth daily as needed for allergies.    . Magnesium 300 MG CAPS 1 capsule with a meal     . Multiple Vitamin (MULTI-VITAMINS) TABS as directed    . Omega-3 Fatty Acids (FISH OIL) 1000 MG CAPS 1 tablet    . simvastatin (ZOCOR) 20 MG tablet Take 20 mg by mouth every evening.    . vitamin C (ASCORBIC ACID) 500 MG tablet Take 500 mg by mouth daily.     No current facility-administered medications for this visit.     Social History   Socioeconomic History  . Marital status: Divorced    Spouse name: Not on file  . Number of children: Not on file  . Years of education: Not on file  . Highest education level: Not on file  Social Needs  . Financial resource strain: Not on file  . Food insecurity - worry: Not on file  . Food insecurity - inability: Not on file  . Transportation needs - medical: Not on file  . Transportation needs - non-medical: Not on file  Occupational History  . Not on file  Tobacco Use  . Smoking status: Never Smoker  . Smokeless tobacco: Never Used  Substance and Sexual Activity  . Alcohol use: No  . Drug use: No  . Sexual activity: No  Other Topics Concern  . Not on file  Social History Narrative   Lives at home alone, works out at [employer] 6 days a week, still very active. Has an ex-husband who she is in contact with. Son died of an MI and another son in [place].     Family History  Problem Relation Age of Onset  . Hypertension Other   . Diabetes Other   . Stroke Other   . CAD Other       Marti Sleigh, MD 01/23/2017, 8:58 AM       Consult Note: Gyn-Onc

## 2017-02-06 ENCOUNTER — Telehealth: Payer: Self-pay | Admitting: *Deleted

## 2017-02-06 NOTE — Telephone Encounter (Signed)
Called and moved patient's appt from February 15th to February 12th. Patient aware of new date/time.

## 2017-03-06 ENCOUNTER — Telehealth: Payer: Self-pay | Admitting: *Deleted

## 2017-03-06 NOTE — Telephone Encounter (Signed)
Pt called lmovm states " I had Vulvar surgery at [facility] with Dr. Fermin Schwab on 01/13/17. He has ordered a Pet scan 03/23/16. I am not a fan of radiation and I would like to avoid the CT Chest that Dr. Julien Nordmann has ordered if possible. I am to see Dr. Julien Nordmann on 2/19, my question is can I skip the CT Chest if possible and just have the PET scan?" Message forward to MD for review.

## 2017-03-08 NOTE — Telephone Encounter (Signed)
Yes PET scan is good.

## 2017-03-09 NOTE — Telephone Encounter (Signed)
Voicemail left.

## 2017-03-23 ENCOUNTER — Ambulatory Visit (HOSPITAL_COMMUNITY)
Admission: RE | Admit: 2017-03-23 | Discharge: 2017-03-23 | Disposition: A | Discharge status: Home / Self Care | Payer: Medicare Other | Source: Ambulatory Visit | Attending: Gynecologic Oncology | Admitting: Gynecologic Oncology

## 2017-03-23 DIAGNOSIS — C519 Malignant neoplasm of vulva, unspecified: Secondary | ICD-10-CM | POA: Diagnosis not present

## 2017-03-23 DIAGNOSIS — I700 Atherosclerosis of aorta: Secondary | ICD-10-CM | POA: Insufficient documentation

## 2017-03-23 DIAGNOSIS — I2510 Atherosclerotic heart disease of native coronary artery without angina pectoris: Secondary | ICD-10-CM | POA: Insufficient documentation

## 2017-03-23 LAB — GLUCOSE, CAPILLARY: Glucose-Capillary: 117 mg/dL — ABNORMAL HIGH (ref 65–99)

## 2017-03-23 MED ORDER — FLUDEOXYGLUCOSE F - 18 (FDG) INJECTION
7.8000 | Freq: Once | INTRAVENOUS | Status: AC | PRN
  Administered 2017-03-23: 7.8 via INTRAVENOUS

## 2017-03-24 ENCOUNTER — Encounter: Payer: Self-pay | Admitting: Gynecology

## 2017-03-24 ENCOUNTER — Inpatient Hospital Stay: Payer: Medicare Other | Attending: Gynecology | Admitting: Gynecology

## 2017-03-24 VITALS — BP 141/81 | HR 58 | Resp 18 | Ht 65.0 in | Wt 155.6 lb

## 2017-03-24 DIAGNOSIS — C3412 Malignant neoplasm of upper lobe, left bronchus or lung: Secondary | ICD-10-CM | POA: Diagnosis not present

## 2017-03-24 DIAGNOSIS — Z79899 Other long term (current) drug therapy: Secondary | ICD-10-CM | POA: Diagnosis not present

## 2017-03-24 DIAGNOSIS — E039 Hypothyroidism, unspecified: Secondary | ICD-10-CM | POA: Diagnosis not present

## 2017-03-24 DIAGNOSIS — Z885 Allergy status to narcotic agent status: Secondary | ICD-10-CM | POA: Diagnosis not present

## 2017-03-24 DIAGNOSIS — I10 Essential (primary) hypertension: Secondary | ICD-10-CM | POA: Insufficient documentation

## 2017-03-24 DIAGNOSIS — Z8541 Personal history of malignant neoplasm of cervix uteri: Secondary | ICD-10-CM | POA: Insufficient documentation

## 2017-03-24 DIAGNOSIS — Z90710 Acquired absence of both cervix and uterus: Secondary | ICD-10-CM | POA: Insufficient documentation

## 2017-03-24 DIAGNOSIS — C519 Malignant neoplasm of vulva, unspecified: Secondary | ICD-10-CM

## 2017-03-24 DIAGNOSIS — Z90722 Acquired absence of ovaries, bilateral: Secondary | ICD-10-CM | POA: Diagnosis not present

## 2017-03-24 DIAGNOSIS — Z923 Personal history of irradiation: Secondary | ICD-10-CM | POA: Diagnosis not present

## 2017-03-24 DIAGNOSIS — E785 Hyperlipidemia, unspecified: Secondary | ICD-10-CM | POA: Insufficient documentation

## 2017-03-24 DIAGNOSIS — Z880 Allergy status to penicillin: Secondary | ICD-10-CM | POA: Diagnosis not present

## 2017-03-24 DIAGNOSIS — I700 Atherosclerosis of aorta: Secondary | ICD-10-CM | POA: Insufficient documentation

## 2017-03-24 NOTE — Patient Instructions (Signed)
Plan to follow up in May or sooner if needed.  Please call for any new symptoms, questions, or concerns.

## 2017-03-24 NOTE — Progress Notes (Signed)
Consult Note: Gyn-Onc   [name] Richards 72 y.o. female  No chief complaint on file.   Assessment : Recurrent vulvar cancer most recently having undergone a small modified radical vulvectomy of the right side on January 13, 2017.  PET scan yesterday showing no evidence of metastatic disease. Prior vulvar cancer status post modified posterior radical vulvectomy on 02/06/2015.    Past history of Poorly differentiated endometrial and cervical cancer 1997. No evidence of disease.  Intermittent partial small bowel obstruction (no recurrences over the past 6 months). 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   . Interval History:   Patient returns today as previously scheduled.  She had a PET scan yesterday that shows no evidence of metastatic disease.  Overall she is feeling very good.  She denies any vulvar symptoms.  She has had no further episodes of intermittent small bowel obstruction.  Overall her functional status is excellent.  She is planning a transatlantic cruise in April.   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 She underwent a total abdominal hysterectomy and bilateral salpingo-oophorectomy, pelvic and para-aortic lymphadenectomy. She received postoperative whole pelvis radiation therapy.  Her other primary gynecologic problem has been lichen sclerosus managed with when necessary clobetasol.  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  In December 2016 the patient was found to have a new primary squamous cell carcinoma the vulva undergoing a modified radical vulvectomy on 02/06/2015. She underwent a modified radical vulvectomy at [facility] on 02/06/2015. She  was found to have a 3 cm vulvar lesion of the posterior vulva extending into the posterior vagina. A rhomboid flap was required to close the incision. Patient had on pelvic postoperative course. She reports she's been doing well. She irrigates the wound twice a day with a tepid water and a blow dryer. She denies any drainage or pain or fever. She has been walking approximate half mile a day.  Final pathology showed some close surgical margins. However, the specimen margins were toward the time of surgery and it is my impression that we were widely around the primary cancer. I discussed these findings with the patient and we've agreed to monitor her closely but not recommend any radiation therapy to the vulva. The patient's agreement with this recommendation.  Recurrent vulvar cancer October 2018 managed by a small modified radical vulvectomy of the right side on January 13, 2017.  Depth of invasion was 4 mm all margins were negative.  Follow-up PET scan March 23, 2017 showed no evidence of metastatic disease.   Review of Systems:10 point review of systems is negative except as noted in interval history.   Vitals: There were no vitals taken for this visit.  Physical Exam: General : The patient is a healthy woman in no acute distress.  HEENT: normocephalic, extraoccular movements normal; neck is supple without thyromegally  Lynphnodes: Supraclavicular and inguinal nodes not enlarged  Abdomen: Soft, non-tender, no ascites, no organomegally, no masses, no hernias  Pelvic:  EGBUS: Normal female mild lichen sclerosis on the anterior vulva.  All surgical sites are well-healed.. There are no lesions on the vulva.  Patient is a small cystocele.  Vagina is atrophic.      Allergies  Allergen Reactions  .  Codeine Other (See Comments)    Other reaction(s): amnesia Reporting amnesia  . Penicillins Hives, Rash and Other (See Comments)    Has patient had a PCN reaction causing immediate rash,  facial/tongue/throat swelling, SOB or lightheadedness with hypotension: NO Has patient had a PCN reaction causing SEVERE RASH INVOLVING MUCUS MEMBRANES OR SKIN NECROSIS: YES Has patient had a PCN reaction that REQUIRED HOSPITALIZATION : patient denies hospitalization Has patient had a PCN reaction occurring within the last 10 years: NO    . Prochlorperazine Edisylate Other (See Comments)    JAWS LOCKED EXTRAPYRAMIDAL Rx  . Fentanyl Other (See Comments)    Other reaction(s): low blood pressure hypotension  . Percocet [Oxycodone-Acetaminophen] Other (See Comments)    Made pt feel real funny, never wanting to take it again; near syncopal episode  . Prochlorperazine     Other reaction(s): extrapyramidal reaction Other reaction(s): extrapyramidal reaction  . Penicillin G Sodium Rash    Other reaction(s): rash    Past Medical History:  Diagnosis Date  . 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 Asthma   . Cervical cancer ([place]) 1997   poorly differentiated cervical carcinoma. S/p TAH and radiation x6 wks   . Complication of anesthesia    took 2 days to wake up after 1 surgery       pt cants use a adult cath,needs peds. cath due to past injury  . Constipation    very worried about this  . Endometrial cancer ([place]) 1997   coincidental endometrial cancer also  . Hyperlipemia   . Hypertension   . Hypothyroidism   . Primary cancer of left upper lobe of lung ([place]) 08/01/2015  . Small bowel obstruction ([place])    12/2008, repeat SBO in 02/2011 also with terminal ilietis   . Thyroid disease   . Vertebral body hemangioma    T12    Past Surgical History:  Procedure Laterality Date  . ABDOMINAL HYSTERECTOMY  Feb 1997   TAH/BSO, pelvic and periaortic lymphadenectomy   . BACK SURGERY    . LYMPH NODE DISSECTION Left 08/01/2015   Procedure: LYMPH NODE DISSECTION;  Surgeon: Melrose Nakayama, MD;  Location: [place];  Service: Thoracic;  Laterality: Left;  . PARTIAL  NEPHRECTOMY     right  . SEGMENTECOMY Left 08/01/2015   Procedure: Left Upper Lobe SEGMENTECTOMY;  Surgeon: Melrose Nakayama, MD;  Location: [place];  Service: Thoracic;  Laterality: Left;  . SKIN BIOPSY    . TUBAL LIGATION    . TUMOR REMOVAL     meningioma  T 12  . VIDEO ASSISTED THORACOSCOPY (VATS)/WEDGE RESECTION Left 08/01/2015   Procedure: VIDEO ASSISTED THORACOSCOPY (VATS)/WEDGE RESECTION;  Surgeon: Melrose Nakayama, MD;  [facility];  Service: Thoracic;  Laterality: Left;    Current Outpatient Medications  Medication Sig Dispense Refill  . acidophilus (RISAQUAD) CAPS Take 1 capsule by mouth daily.    Marland Kitchen albuterol (PROVENTIL HFA;VENTOLIN HFA) 108 (90 Base) MCG/ACT inhaler Inhale 2 puffs into the lungs every 6 (six) hours as needed for wheezing or shortness of breath. Reported on 06/29/2015    . benazepril-hydrochlorthiazide (LOTENSIN HCT) 20-12.5 MG per tablet Take 1 tablet by mouth daily.    . Biotin 10 MG TABS 1 tablet    . cholecalciferol (VITAMIN D) 1000 UNITS tablet Take 1,000 Units by mouth 2 (two) times daily.     . clobetasol ointment (TEMOVATE) 0.05 % Apply topically daily as needed.     Marland Kitchen  dicyclomine (BENTYL) 20 MG tablet Take 1 tablet (20 mg total) by mouth every 6 (six) hours as needed for spasms (for abdominal cramping). 20 tablet 0  . docusate sodium (COLACE) 100 MG capsule Take 100 mg by mouth 2 (two) times daily.    Marland Kitchen levothyroxine (SYNTHROID, LEVOTHROID) 75 MCG tablet Take 75 mcg by mouth daily.    Marland Kitchen loratadine (CLARITIN) 10 MG tablet Take 10 mg by mouth daily as needed for allergies.    . Magnesium 300 MG CAPS 1 capsule with a meal    . Multiple Vitamin (MULTI-VITAMINS) TABS as directed    . Omega-3 Fatty Acids (FISH OIL) 1000 MG CAPS 1 tablet    . simvastatin (ZOCOR) 20 MG tablet Take 20 mg by mouth every evening.     No current facility-administered medications for this visit.     Social History   Socioeconomic History  . Marital status: Divorced     Spouse name: Not on file  . Number of children: Not on file  . Years of education: Not on file  . Highest education level: Not on file  Social Needs  . Financial resource strain: Not on file  . Food insecurity - worry: Not on file  . Food insecurity - inability: Not on file  . Transportation needs - medical: Not on file  . Transportation needs - non-medical: Not on file  Occupational History  . Not on file  Tobacco Use  . Smoking status: Never Smoker  . Smokeless tobacco: Never Used  Substance and Sexual Activity  . Alcohol use: No  . Drug use: No  . Sexual activity: No  Other Topics Concern  . Not on file  Social History Narrative   Lives at home alone, works out at [employer] 6 days a week, still very active. Has an ex-husband who she is in contact with. Son died of an MI and another son in [place].     Family History  Problem Relation Age of Onset  . Hypertension Other   . Diabetes Other   . Stroke Other   . CAD Other       Marti Sleigh, MD 03/24/2017, 9:38 AM       Consult Note: Gyn-Onc

## 2017-03-27 ENCOUNTER — Ambulatory Visit: Payer: Medicare Other | Admitting: Gynecology

## 2017-03-30 ENCOUNTER — Inpatient Hospital Stay: Payer: Medicare Other

## 2017-03-30 DIAGNOSIS — I10 Essential (primary) hypertension: Secondary | ICD-10-CM

## 2017-03-30 DIAGNOSIS — C3412 Malignant neoplasm of upper lobe, left bronchus or lung: Secondary | ICD-10-CM

## 2017-03-30 LAB — CBC WITH DIFFERENTIAL/PLATELET
Basophils Absolute: 0 10*3/uL (ref 0.0–0.1)
Basophils Relative: 1 %
Eosinophils Absolute: 0.1 10*3/uL (ref 0.0–0.5)
Eosinophils Relative: 1 %
HCT: 36.1 % (ref 34.8–46.6)
Hemoglobin: 11.6 g/dL (ref 11.6–15.9)
Lymphocytes Relative: 22 %
Lymphs Abs: 0.8 10*3/uL — ABNORMAL LOW (ref 0.9–3.3)
MCH: 21.7 pg — ABNORMAL LOW (ref 25.1–34.0)
MCHC: 32.2 g/dL (ref 31.5–36.0)
MCV: 67.2 fL — ABNORMAL LOW (ref 79.5–101.0)
Monocytes Absolute: 0.3 10*3/uL (ref 0.1–0.9)
Monocytes Relative: 10 %
Neutro Abs: 2.4 10*3/uL (ref 1.5–6.5)
Neutrophils Relative %: 66 %
Platelets: 207 10*3/uL (ref 145–400)
RBC: 5.37 MIL/uL (ref 3.70–5.45)
RDW: 14.2 % (ref 11.2–14.5)
WBC: 3.6 10*3/uL — ABNORMAL LOW (ref 3.9–10.3)

## 2017-03-30 LAB — COMPREHENSIVE METABOLIC PANEL
ALT: 16 U/L (ref 0–55)
AST: 18 U/L (ref 5–34)
Albumin: 3.8 g/dL (ref 3.5–5.0)
Alkaline Phosphatase: 83 U/L (ref 40–150)
Anion gap: 8 (ref 3–11)
BUN: 20 mg/dL (ref 7–26)
CO2: 27 mmol/L (ref 22–29)
Calcium: 9.7 mg/dL (ref 8.4–10.4)
Chloride: 100 mmol/L (ref 98–109)
Creatinine, Ser: 0.79 mg/dL (ref 0.60–1.10)
GFR calc Af Amer: >60 mL/min (ref >60)
GFR calc non Af Amer: >60 mL/min (ref >60)
Glucose, Bld: 102 mg/dL (ref 70–140)
Potassium: 4.2 mmol/L (ref 3.5–5.1)
Sodium: 135 mmol/L — ABNORMAL LOW (ref 136–145)
Total Bilirubin: 0.5 mg/dL (ref 0.2–1.2)
Total Protein: 6.6 g/dL (ref 6.4–8.3)

## 2017-03-31 ENCOUNTER — Encounter: Payer: Self-pay | Admitting: Internal Medicine

## 2017-03-31 ENCOUNTER — Inpatient Hospital Stay (HOSPITAL_BASED_OUTPATIENT_CLINIC_OR_DEPARTMENT_OTHER): Payer: Medicare Other | Admitting: Internal Medicine

## 2017-03-31 VITALS — BP 168/79 | HR 65 | Temp 98.1°F | Resp 19 | Ht 65.0 in

## 2017-03-31 DIAGNOSIS — E785 Hyperlipidemia, unspecified: Secondary | ICD-10-CM

## 2017-03-31 DIAGNOSIS — I700 Atherosclerosis of aorta: Secondary | ICD-10-CM

## 2017-03-31 DIAGNOSIS — C519 Malignant neoplasm of vulva, unspecified: Secondary | ICD-10-CM | POA: Diagnosis not present

## 2017-03-31 DIAGNOSIS — Z880 Allergy status to penicillin: Secondary | ICD-10-CM

## 2017-03-31 DIAGNOSIS — C3490 Malignant neoplasm of unspecified part of unspecified bronchus or lung: Secondary | ICD-10-CM

## 2017-03-31 DIAGNOSIS — Z79899 Other long term (current) drug therapy: Secondary | ICD-10-CM | POA: Diagnosis not present

## 2017-03-31 DIAGNOSIS — E039 Hypothyroidism, unspecified: Secondary | ICD-10-CM | POA: Diagnosis not present

## 2017-03-31 DIAGNOSIS — Z90710 Acquired absence of both cervix and uterus: Secondary | ICD-10-CM

## 2017-03-31 DIAGNOSIS — Z923 Personal history of irradiation: Secondary | ICD-10-CM | POA: Diagnosis not present

## 2017-03-31 DIAGNOSIS — Z885 Allergy status to narcotic agent status: Secondary | ICD-10-CM

## 2017-03-31 DIAGNOSIS — C3412 Malignant neoplasm of upper lobe, left bronchus or lung: Secondary | ICD-10-CM | POA: Diagnosis not present

## 2017-03-31 DIAGNOSIS — I10 Essential (primary) hypertension: Secondary | ICD-10-CM

## 2017-03-31 DIAGNOSIS — Z8541 Personal history of malignant neoplasm of cervix uteri: Secondary | ICD-10-CM

## 2017-03-31 DIAGNOSIS — Z90722 Acquired absence of ovaries, bilateral: Secondary | ICD-10-CM

## 2017-03-31 NOTE — Progress Notes (Signed)
[place] [phone]   [phone]  OFFICE PROGRESS NOTE  Harlan Stains, MD [place] [address]  DIAGNOSIS: stage IA (T1b, N0, M0) non-small cell lung cancer, adenocarcinoma presented with left upper lobe pulmonary nodule.  PRIOR THERAPY: status post lingula sparing left upper lobectomy with lymph node dissection on 08/01/2015.  CURRENT THERAPY: Observation.  INTERVAL HISTORY: [name] Richards 72 y.o. female returns to the clinic today for follow-up visit.  The patient is feeling fine today with no specific complaints.  She was diagnosed with valvular cancer status post surgical resection by Dr. Fermin Schwab.  She denied having any chest pain, shortness of breath, cough or hemoptysis.  She denied having any weight loss or night sweats.  She has no nausea, vomiting, diarrhea or constipation.  She continues to be very active and goes to the gym on a daily basis.  The patient had a repeat PET scan performed recently and she is here for evaluation and discussion of her scan results and treatment options.  MEDICAL HISTORY: Past Medical History:  Diagnosis Date  . 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 Asthma   . Cervical cancer ([facility]) 1997   poorly differentiated cervical carcinoma. S/p TAH and radiation x6 wks   . Complication of anesthesia    took 2 days to wake up after 1 surgery       pt cants use a adult cath,needs peds. cath due to past injury  . Constipation    very worried about this  . Endometrial cancer ([facility]) 1997   coincidental endometrial cancer also  . Hyperlipemia   . Hypertension   . Hypothyroidism   . Primary cancer of left upper lobe of lung ([place]) 08/01/2015  . Small bowel obstruction ([place])    12/2008, repeat SBO in 02/2011 also with terminal ilietis   . Thyroid disease   . Vertebral body hemangioma    T12    ALLERGIES:  is allergic to codeine; penicillins;  prochlorperazine edisylate; fentanyl; percocet [oxycodone-acetaminophen]; prochlorperazine; and penicillin g sodium.  MEDICATIONS:  Current Outpatient Medications  Medication Sig Dispense Refill  . acidophilus (RISAQUAD) CAPS Take 1 capsule by mouth daily.    Marland Kitchen albuterol (PROVENTIL HFA;VENTOLIN HFA) 108 (90 Base) MCG/ACT inhaler Inhale 2 puffs into the lungs every 6 (six) hours as needed for wheezing or shortness of breath. Reported on 06/29/2015    . benazepril-hydrochlorthiazide (LOTENSIN HCT) 20-12.5 MG per tablet Take 1 tablet by mouth daily.    . Biotin 10 MG TABS 1 tablet    . cholecalciferol (VITAMIN D) 1000 UNITS tablet Take 1,000 Units by mouth 2 (two) times daily.     . clobetasol ointment (TEMOVATE) 0.05 % Apply topically daily as needed.     . dicyclomine (BENTYL) 20 MG tablet Take 1 tablet (20 mg total) by mouth every 6 (six) hours as needed for spasms (for abdominal cramping). 20 tablet 0  . docusate sodium (COLACE) 100 MG capsule Take 100 mg by mouth 2 (two) times daily.    Marland Kitchen levothyroxine (SYNTHROID, LEVOTHROID) 75 MCG tablet Take 75 mcg by mouth daily.    Marland Kitchen loratadine (CLARITIN) 10 MG tablet Take 10 mg by mouth daily as needed for allergies.    . Magnesium 300 MG CAPS 1 capsule with a meal    . Multiple Vitamin (MULTI-VITAMINS) TABS as directed    . Omega-3 Fatty Acids (FISH OIL) 1000 MG  CAPS 1 tablet    . simvastatin (ZOCOR) 20 MG tablet Take 20 mg by mouth every evening.     No current facility-administered medications for this visit.     SURGICAL HISTORY:  Past Surgical History:  Procedure Laterality Date  . ABDOMINAL HYSTERECTOMY  Feb 1997   TAH/BSO, pelvic and periaortic lymphadenectomy   . BACK SURGERY    . LYMPH NODE DISSECTION Left 08/01/2015   Procedure: LYMPH NODE DISSECTION;  Surgeon: Melrose Nakayama, MD;  Location: [facility];  Service: Thoracic;  Laterality: Left;  . PARTIAL NEPHRECTOMY     right  . SEGMENTECOMY Left 08/01/2015   Procedure: Left Upper Lobe  SEGMENTECTOMY;  Surgeon: Melrose Nakayama, MD;  Location: [facility];  Service: Thoracic;  Laterality: Left;  . SKIN BIOPSY    . TUBAL LIGATION    . TUMOR REMOVAL     meningioma  T 12  . VIDEO ASSISTED THORACOSCOPY (VATS)/WEDGE RESECTION Left 08/01/2015   Procedure: VIDEO ASSISTED THORACOSCOPY (VATS)/WEDGE RESECTION;  Surgeon: Melrose Nakayama, MD;  Location: [facility];  Service: Thoracic;  Laterality: Left;    REVIEW OF SYSTEMS:  A comprehensive review of systems was negative.   PHYSICAL EXAMINATION: General appearance: alert, cooperative and no distress Head: Normocephalic, without obvious abnormality, atraumatic Neck: no adenopathy, no JVD, supple, symmetrical, trachea midline and thyroid not enlarged, symmetric, no tenderness/mass/nodules Lymph nodes: Cervical, supraclavicular, and axillary nodes normal. Resp: clear to auscultation bilaterally Back: symmetric, no curvature. ROM normal. No CVA tenderness. Cardio: regular rate and rhythm, S1, S2 normal, no murmur, click, rub or gallop GI: soft, non-tender; bowel sounds normal; no masses,  no organomegaly Extremities: extremities normal, atraumatic, no cyanosis or edema  ECOG PERFORMANCE STATUS: 0 - Asymptomatic  Blood pressure (!) 168/79, pulse 65, temperature 98.1 F (36.7 C), temperature source Oral, resp. rate 19, height 5\' 5"  (1.651 m), SpO2 100 %.  LABORATORY DATA: Lab Results  Component Value Date   WBC 3.6 (L) 03/30/2017   HGB 11.6 03/30/2017   HCT 36.1 03/30/2017   MCV 67.2 (L) 03/30/2017   PLT 207 03/30/2017      Chemistry      Component Value Date/Time   NA 135 (L) 03/30/2017 0813   NA 136 09/18/2016 1536   K 4.2 03/30/2017 0813   K 3.9 09/18/2016 1536   CL 100 03/30/2017 0813   CO2 27 03/30/2017 0813   CO2 26 09/18/2016 1536   BUN 20 03/30/2017 0813   BUN 15.8 09/18/2016 1536   CREATININE 0.79 03/30/2017 0813   CREATININE 0.8 09/18/2016 1536      Component Value Date/Time   CALCIUM 9.7 03/30/2017 0813    CALCIUM 9.3 09/18/2016 1536   ALKPHOS 83 03/30/2017 0813   ALKPHOS 91 09/18/2016 1536   AST 18 03/30/2017 0813   AST 18 09/18/2016 1536   ALT 16 03/30/2017 0813   ALT 17 09/18/2016 1536   BILITOT 0.5 03/30/2017 0813   BILITOT 0.42 09/18/2016 1536       RADIOGRAPHIC STUDIES: Nm Pet Image Restag (ps) Skull Base To Thigh  Result Date: 03/23/2017 CLINICAL DATA:  Subsequent treatment strategy for metastatic vulvar cancer. EXAM: NUCLEAR MEDICINE PET SKULL BASE TO THIGH TECHNIQUE: 7.8 mCi F-18 FDG was injected intravenously. Full-ring PET imaging was performed from the skull base to thigh after the radiotracer. CT data was obtained and used for attenuation correction and anatomic localization. FASTING BLOOD GLUCOSE:  Value: 117 mg/dl COMPARISON:  CT chest 09/19/2016, CT abdomen pelvis 03/30/2016 and  PET 01/30/2015. FINDINGS: NECK: No hypermetabolic lymph nodes in the neck. CT images show no acute findings. CHEST: No hypermetabolic mediastinal, hilar or axillary lymph nodes. No hypermetabolic pulmonary nodules. Atherosclerotic calcification of the arterial vasculature, including coronary arteries. Heart is mildly enlarged. No pericardial or pleural effusion. Postoperative changes in the left upper lobe. ABDOMEN/PELVIS: No abnormal hypermetabolism in the liver, adrenal glands, spleen or pancreas. No hypermetabolic lymph nodes. Liver, gallbladder, adrenal glands, kidneys, spleen, pancreas, stomach and bowel are grossly unremarkable. Atherosclerotic calcification of the arterial vasculature. Surgical clips along both pelvic sidewalls. SKELETON: No abnormal osseous hypermetabolism. IMPRESSION: 1. No evidence of recurrent or metastatic disease. 2. Aortic atherosclerosis (ICD10-170.0). Coronary artery calcification. Electronically Signed   By: Lorin Picket M.D.   On: 03/23/2017 13:32    ASSESSMENT AND PLAN: This is a very pleasant 72 years old white female with history of stage IA non-small cell lung cancer  status post lingular sparing left upper lobectomy with lymph node dissection in June 2017. The patient has been in observation since that time and she is feeling fine. She had repeat PET scan performed recently for evaluation of the valvular cancer and the scan showed no concerning findings for disease recurrence or metastatic disease from her lung cancer or valvular cancer. I recommended for her to continue on observation with repeat CT scan of the chest in 6 months. For hypertension, I strongly recommend for the patient to monitor her blood pressure closely at home and to report to her primary care physician if no improvement for adjustment of her medications. The patient was advised to call immediately if she has any concerning symptoms in the interval. The patient voices understanding of current disease status and treatment options and is in agreement with the current care plan. All questions were answered. The patient knows to call the clinic with any problems, questions or concerns. We can certainly see the patient much sooner if necessary. I spent 10 minutes counseling the patient face to face. The total time spent in the appointment was 15 minutes.  Disclaimer: This note was dictated with voice recognition software. Similar sounding words can inadvertently be transcribed and may not be corrected upon review.

## 2017-04-01 ENCOUNTER — Telehealth: Payer: Self-pay | Admitting: Internal Medicine

## 2017-04-01 ENCOUNTER — Encounter: Payer: Self-pay | Admitting: Internal Medicine

## 2017-04-01 NOTE — Telephone Encounter (Signed)
Scheduled appt per 2/19 los - sent reminder letter in the mail - lab and f./u in 6 months.

## 2017-04-02 ENCOUNTER — Telehealth: Payer: Self-pay | Admitting: Internal Medicine

## 2017-04-02 NOTE — Telephone Encounter (Signed)
R/s appt per 2/20 telephone message - left message for patient with updated time and date.

## 2017-06-23 ENCOUNTER — Inpatient Hospital Stay: Payer: Medicare Other | Attending: Gynecology | Admitting: Gynecology

## 2017-06-23 ENCOUNTER — Encounter: Payer: Self-pay | Admitting: Gynecology

## 2017-06-23 VITALS — BP 141/86 | HR 69 | Temp 98.5°F | Ht 65.0 in | Wt 152.9 lb

## 2017-06-23 DIAGNOSIS — C519 Malignant neoplasm of vulva, unspecified: Secondary | ICD-10-CM

## 2017-06-23 DIAGNOSIS — Z90722 Acquired absence of ovaries, bilateral: Secondary | ICD-10-CM | POA: Insufficient documentation

## 2017-06-23 DIAGNOSIS — Z923 Personal history of irradiation: Secondary | ICD-10-CM | POA: Insufficient documentation

## 2017-06-23 DIAGNOSIS — Z90710 Acquired absence of both cervix and uterus: Secondary | ICD-10-CM | POA: Diagnosis not present

## 2017-06-23 NOTE — Progress Notes (Signed)
Consult Note: Gyn-Onc   [name] Richards 72 y.o. female  Chief Complaint  Patient presents with  . Vulvar cancer [facility])    Assessment : Recurrent vulvar cancer most recently having undergone a small modified radical vulvectomy of the right side on January 13, 2017.  Prior vulvar cancer status post modified posterior radical vulvectomy on 02/06/2015.    Past history of Poorly differentiated endometrial and cervical cancer 1997. No evidence of disease.  Intermittent partial small bowel obstruction (no recurrences over the past 12 months). 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   . Interval History:   Patient returns today as previously scheduled.   .  Overall she is feeling very good.  She denies any vulvar symptoms.  She has had no further episodes of intermittent small bowel obstruction.  Overall her functional status is excellent.   She just returned from an 11 day [place] to [place].   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 She underwent a total abdominal hysterectomy and bilateral salpingo-oophorectomy, pelvic and para-aortic lymphadenectomy. She received postoperative whole pelvis radiation therapy.  Her other primary gynecologic problem has been lichen sclerosus managed with when necessary clobetasol.  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  In December 2016 the patient was found to have a new primary squamous cell carcinoma the vulva undergoing a modified radical vulvectomy on 02/06/2015. She underwent a modified radical vulvectomy at [facility] on 02/06/2015. She was found to have a 3 cm vulvar lesion of the posterior vulva extending into  the posterior vagina. A rhomboid flap was required to close the incision. Patient had on pelvic postoperative course. She reports she's been doing well. She irrigates the wound twice a day with a tepid water and a blow dryer. She denies any drainage or pain or fever. She has been walking approximate half mile a day.  Final pathology showed some close surgical margins. However, the specimen margins were toward the time of surgery and it is my impression that we were widely around the primary cancer. I discussed these findings with the patient and we've agreed to monitor her closely but not recommend any radiation therapy to the vulva. The patient's agreement with this recommendation.  Recurrent vulvar cancer October 2018 managed by a small modified radical vulvectomy of the right side on January 13, 2017.  Depth of invasion was 4 mm all margins were negative.  Follow-up PET scan March 23, 2017 showed no evidence of metastatic disease.   Review of Systems:10 point review of systems is negative except as noted in interval history.   Vitals: Blood pressure (!) 141/86, pulse 69, temperature 98.5 F (36.9 C), temperature source Oral, height 5\' 5"  (1.651 m), weight 152 lb 14.4 oz (69.4 kg), SpO2 99 %.  Physical Exam: General : The patient is a healthy woman in no acute distress.  HEENT: normocephalic, extraoccular movements normal; neck is supple without thyromegally  Lynphnodes: Supraclavicular and inguinal nodes not enlarged  Abdomen: Soft, non-tender, no ascites, no organomegally, no masses, no hernias  Pelvic:  EGBUS: Normal female mild lichen sclerosis on the anterior vulva.  All surgical sites are well-healed.. There are no lesions on the vulva.  Patient is a small cystocele.  Vagina  is atrophic.      Allergies  Allergen Reactions  . Codeine Other (See Comments)    Other reaction(s): amnesia Reporting amnesia  . Penicillins Hives, Rash and Other (See Comments)    Has patient had a  PCN reaction causing immediate rash, facial/tongue/throat swelling, SOB or lightheadedness with hypotension: NO Has patient had a PCN reaction causing SEVERE RASH INVOLVING MUCUS MEMBRANES OR SKIN NECROSIS: YES Has patient had a PCN reaction that REQUIRED HOSPITALIZATION : patient denies hospitalization Has patient had a PCN reaction occurring within the last 10 years: NO    . Prochlorperazine Edisylate Other (See Comments)    JAWS LOCKED EXTRAPYRAMIDAL Rx  . Fentanyl Other (See Comments)    Other reaction(s): low blood pressure hypotension  . Percocet [Oxycodone-Acetaminophen] Other (See Comments)    Made pt feel real funny, never wanting to take it again; near syncopal episode  . Prochlorperazine     Other reaction(s): extrapyramidal reaction Other reaction(s): extrapyramidal reaction  . Penicillin G Sodium Rash    Other reaction(s): rash    Past Medical History:  Diagnosis Date  . 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 Asthma   . Cervical cancer ([place]) 1997   poorly differentiated cervical carcinoma. S/p TAH and radiation x6 wks   . Complication of anesthesia    took 2 days to wake up after 1 surgery       pt cants use a adult cath,needs peds. cath due to past injury  . Constipation    very worried about this  . Endometrial cancer ([place]) 1997   coincidental endometrial cancer also  . Hyperlipemia   . Hypertension   . Hypothyroidism   . Primary cancer of left upper lobe of lung ([place]) 08/01/2015  . Small bowel obstruction ([facility])    12/2008, repeat SBO in 02/2011 also with terminal ilietis   . Thyroid disease   . Vertebral body hemangioma    T12    Past Surgical History:  Procedure Laterality Date  . ABDOMINAL HYSTERECTOMY  Feb 1997   TAH/BSO, pelvic and periaortic lymphadenectomy   . BACK SURGERY    . LYMPH NODE DISSECTION Left 08/01/2015   Procedure: LYMPH NODE DISSECTION;  Surgeon: Melrose Nakayama, MD;  Location: [facility];  Service: Thoracic;   Laterality: Left;  . PARTIAL NEPHRECTOMY     right  . SEGMENTECOMY Left 08/01/2015   Procedure: Left Upper Lobe SEGMENTECTOMY;  Surgeon: Melrose Nakayama, MD;  Location: [facility];  Service: Thoracic;  Laterality: Left;  . SKIN BIOPSY    . TUBAL LIGATION    . TUMOR REMOVAL     meningioma  T 12  . VIDEO ASSISTED THORACOSCOPY (VATS)/WEDGE RESECTION Left 08/01/2015   Procedure: VIDEO ASSISTED THORACOSCOPY (VATS)/WEDGE RESECTION;  Surgeon: Melrose Nakayama, MD;  [facility];  Service: Thoracic;  Laterality: Left;    Current Outpatient Medications  Medication Sig Dispense Refill  . acidophilus (RISAQUAD) CAPS Take 1 capsule by mouth daily.    Marland Kitchen albuterol (PROVENTIL HFA;VENTOLIN HFA) 108 (90 Base) MCG/ACT inhaler Inhale 2 puffs into the lungs every 6 (six) hours as needed for wheezing or shortness of breath. Reported on 06/29/2015    . benazepril-hydrochlorthiazide (LOTENSIN HCT) 20-12.5 MG per tablet Take 1 tablet by mouth daily.    . Biotin 10 MG TABS 1 tablet    . cholecalciferol (VITAMIN D) 1000 UNITS tablet Take 1,000 Units by mouth 2 (two) times daily.     Marland Kitchen  clobetasol ointment (TEMOVATE) 0.05 % Apply topically daily as needed.     . dicyclomine (BENTYL) 20 MG tablet Take 1 tablet (20 mg total) by mouth every 6 (six) hours as needed for spasms (for abdominal cramping). 20 tablet 0  . docusate sodium (COLACE) 100 MG capsule Take 100 mg by mouth 2 (two) times daily.    Marland Kitchen levothyroxine (SYNTHROID, LEVOTHROID) 75 MCG tablet Take 75 mcg by mouth daily.    Marland Kitchen loratadine (CLARITIN) 10 MG tablet Take 10 mg by mouth daily as needed for allergies.    . Magnesium 300 MG CAPS 1 capsule with a meal    . Multiple Vitamin (MULTI-VITAMINS) TABS as directed    . Omega-3 Fatty Acids (FISH OIL) 1000 MG CAPS 1 tablet    . simvastatin (ZOCOR) 20 MG tablet Take 20 mg by mouth every evening.     No current facility-administered medications for this visit.     Social History   Socioeconomic History   . Marital status: Divorced    Spouse name: Not on file  . Number of children: Not on file  . Years of education: Not on file  . Highest education level: Not on file  Occupational History  . Not on file  Social Needs  . Financial resource strain: Not on file  . Food insecurity:    Worry: Not on file    Inability: Not on file  . Transportation needs:    Medical: Not on file    Non-medical: Not on file  Tobacco Use  . Smoking status: Never Smoker  . Smokeless tobacco: Never Used  Substance and Sexual Activity  . Alcohol use: No  . Drug use: No  . Sexual activity: Never  Lifestyle  . Physical activity:    Days per week: Not on file    Minutes per session: Not on file  . Stress: Not on file  Relationships  . Social connections:    Talks on phone: Not on file    Gets together: Not on file    Attends religious service: Not on file    Active member of club or organization: Not on file    Attends meetings of clubs or organizations: Not on file    Relationship status: Not on file  . Intimate partner violence:    Fear of current or ex partner: Not on file    Emotionally abused: Not on file    Physically abused: Not on file    Forced sexual activity: Not on file  Other Topics Concern  . Not on file  Social History Narrative   Lives at home alone, works out at [employer] 6 days a week, still very active. Has an ex-husband who she is in contact with. Son died of an MI and another son in [place].     Family History  Problem Relation Age of Onset  . Hypertension Other   . Diabetes Other   . Stroke Other   . CAD Other       Marti Sleigh, MD 06/23/2017, 9:15 AM       Consult Note: Gyn-Onc

## 2017-06-23 NOTE — Patient Instructions (Signed)
Plan to follow up in three months (August) or sooner if needed.  Please call for any questions, concerns, or new symptoms.

## 2017-09-11 ENCOUNTER — Other Ambulatory Visit: Payer: Medicare Other

## 2017-09-14 ENCOUNTER — Inpatient Hospital Stay: Payer: Medicare Other | Attending: Gynecology

## 2017-09-14 ENCOUNTER — Encounter (HOSPITAL_COMMUNITY): Payer: Self-pay

## 2017-09-14 ENCOUNTER — Ambulatory Visit (HOSPITAL_COMMUNITY)
Admission: RE | Admit: 2017-09-14 | Discharge: 2017-09-14 | Disposition: A | Discharge status: Home / Self Care | Payer: Medicare Other | Source: Ambulatory Visit | Attending: Internal Medicine | Admitting: Internal Medicine

## 2017-09-14 DIAGNOSIS — Z90710 Acquired absence of both cervix and uterus: Secondary | ICD-10-CM | POA: Insufficient documentation

## 2017-09-14 DIAGNOSIS — Z90722 Acquired absence of ovaries, bilateral: Secondary | ICD-10-CM | POA: Diagnosis not present

## 2017-09-14 DIAGNOSIS — Z923 Personal history of irradiation: Secondary | ICD-10-CM | POA: Insufficient documentation

## 2017-09-14 DIAGNOSIS — Z8542 Personal history of malignant neoplasm of other parts of uterus: Secondary | ICD-10-CM | POA: Insufficient documentation

## 2017-09-14 DIAGNOSIS — C519 Malignant neoplasm of vulva, unspecified: Secondary | ICD-10-CM | POA: Diagnosis present

## 2017-09-14 DIAGNOSIS — Z79899 Other long term (current) drug therapy: Secondary | ICD-10-CM | POA: Insufficient documentation

## 2017-09-14 DIAGNOSIS — E039 Hypothyroidism, unspecified: Secondary | ICD-10-CM | POA: Diagnosis not present

## 2017-09-14 DIAGNOSIS — E785 Hyperlipidemia, unspecified: Secondary | ICD-10-CM | POA: Insufficient documentation

## 2017-09-14 DIAGNOSIS — I10 Essential (primary) hypertension: Secondary | ICD-10-CM | POA: Diagnosis not present

## 2017-09-14 DIAGNOSIS — I2510 Atherosclerotic heart disease of native coronary artery without angina pectoris: Secondary | ICD-10-CM | POA: Diagnosis not present

## 2017-09-14 DIAGNOSIS — C3490 Malignant neoplasm of unspecified part of unspecified bronchus or lung: Secondary | ICD-10-CM

## 2017-09-14 DIAGNOSIS — Z8541 Personal history of malignant neoplasm of cervix uteri: Secondary | ICD-10-CM | POA: Insufficient documentation

## 2017-09-14 DIAGNOSIS — C3412 Malignant neoplasm of upper lobe, left bronchus or lung: Secondary | ICD-10-CM | POA: Insufficient documentation

## 2017-09-14 DIAGNOSIS — I700 Atherosclerosis of aorta: Secondary | ICD-10-CM | POA: Diagnosis not present

## 2017-09-14 LAB — CBC WITH DIFFERENTIAL (CANCER CENTER ONLY)
Basophils Absolute: 0 10*3/uL (ref 0.0–0.1)
Basophils Relative: 1 %
Eosinophils Absolute: 0.1 10*3/uL (ref 0.0–0.5)
Eosinophils Relative: 1 %
HCT: 35.7 % (ref 34.8–46.6)
Hemoglobin: 11.7 g/dL (ref 11.6–15.9)
Lymphocytes Relative: 21 %
Lymphs Abs: 0.9 10*3/uL (ref 0.9–3.3)
MCH: 22.1 pg — ABNORMAL LOW (ref 25.1–34.0)
MCHC: 32.8 g/dL (ref 31.5–36.0)
MCV: 67.4 fL — ABNORMAL LOW (ref 79.5–101.0)
Monocytes Absolute: 0.4 10*3/uL (ref 0.1–0.9)
Monocytes Relative: 10 %
Neutro Abs: 2.9 10*3/uL (ref 1.5–6.5)
Neutrophils Relative %: 67 %
Platelet Count: 169 10*3/uL (ref 145–400)
RBC: 5.3 MIL/uL (ref 3.70–5.45)
RDW: 14.5 % (ref 11.2–14.5)
WBC Count: 4.3 10*3/uL (ref 3.9–10.3)

## 2017-09-14 LAB — CMP (CANCER CENTER ONLY)
ALT: 19 U/L (ref 0–44)
AST: 19 U/L (ref 15–41)
Albumin: 3.9 g/dL (ref 3.5–5.0)
Alkaline Phosphatase: 93 U/L (ref 38–126)
Anion gap: 10 (ref 5–15)
BUN: 17 mg/dL (ref 8–23)
CO2: 27 mmol/L (ref 22–32)
Calcium: 9.2 mg/dL (ref 8.9–10.3)
Chloride: 96 mmol/L — ABNORMAL LOW (ref 98–111)
Creatinine: 0.81 mg/dL (ref 0.44–1.00)
GFR, Est AFR Am: >60 mL/min (ref >60)
GFR, Estimated: >60 mL/min (ref >60)
Glucose, Bld: 99 mg/dL (ref 70–99)
Potassium: 4.2 mmol/L (ref 3.5–5.1)
Sodium: 133 mmol/L — ABNORMAL LOW (ref 135–145)
Total Bilirubin: 0.7 mg/dL (ref 0.3–1.2)
Total Protein: 6.5 g/dL (ref 6.5–8.1)

## 2017-09-14 MED ORDER — IOHEXOL 300 MG/ML  SOLN
75.0000 mL | Freq: Once | INTRAMUSCULAR | Status: AC | PRN
Start: 2017-09-14 — End: 2017-09-14
  Administered 2017-09-14: 75 mL via INTRAVENOUS

## 2017-09-15 ENCOUNTER — Encounter: Payer: Self-pay | Admitting: Internal Medicine

## 2017-09-15 ENCOUNTER — Inpatient Hospital Stay (HOSPITAL_BASED_OUTPATIENT_CLINIC_OR_DEPARTMENT_OTHER): Payer: Medicare Other | Admitting: Internal Medicine

## 2017-09-15 ENCOUNTER — Telehealth: Payer: Self-pay | Admitting: Internal Medicine

## 2017-09-15 VITALS — BP 170/71 | HR 71 | Temp 98.5°F | Resp 18 | Ht 65.0 in | Wt 154.3 lb

## 2017-09-15 DIAGNOSIS — Z8541 Personal history of malignant neoplasm of cervix uteri: Secondary | ICD-10-CM

## 2017-09-15 DIAGNOSIS — Z90710 Acquired absence of both cervix and uterus: Secondary | ICD-10-CM

## 2017-09-15 DIAGNOSIS — Z79899 Other long term (current) drug therapy: Secondary | ICD-10-CM

## 2017-09-15 DIAGNOSIS — C3412 Malignant neoplasm of upper lobe, left bronchus or lung: Secondary | ICD-10-CM | POA: Diagnosis not present

## 2017-09-15 DIAGNOSIS — C3490 Malignant neoplasm of unspecified part of unspecified bronchus or lung: Secondary | ICD-10-CM

## 2017-09-15 DIAGNOSIS — I10 Essential (primary) hypertension: Secondary | ICD-10-CM

## 2017-09-15 DIAGNOSIS — Z90722 Acquired absence of ovaries, bilateral: Secondary | ICD-10-CM | POA: Diagnosis not present

## 2017-09-15 DIAGNOSIS — E785 Hyperlipidemia, unspecified: Secondary | ICD-10-CM

## 2017-09-15 DIAGNOSIS — C519 Malignant neoplasm of vulva, unspecified: Secondary | ICD-10-CM | POA: Diagnosis not present

## 2017-09-15 DIAGNOSIS — Z923 Personal history of irradiation: Secondary | ICD-10-CM

## 2017-09-15 DIAGNOSIS — I2510 Atherosclerotic heart disease of native coronary artery without angina pectoris: Secondary | ICD-10-CM

## 2017-09-15 DIAGNOSIS — E039 Hypothyroidism, unspecified: Secondary | ICD-10-CM

## 2017-09-15 DIAGNOSIS — Z8542 Personal history of malignant neoplasm of other parts of uterus: Secondary | ICD-10-CM

## 2017-09-15 NOTE — Progress Notes (Signed)
[facility] [phone]   [phone]  OFFICE PROGRESS NOTE  Harlan Stains, MD [place] [address]  DIAGNOSIS: stage IA (T1b, N0, M0) non-small cell lung cancer, adenocarcinoma presented with left upper lobe pulmonary nodule.  PRIOR THERAPY: status post lingula sparing left upper lobectomy with lymph node dissection on 08/01/2015.  CURRENT THERAPY: Observation.  INTERVAL HISTORY: [name] Richards 72 y.o. female returns to the clinic today for follow-up visit.  The patient is feeling fine today with no specific complaints.  She was very anxious about her risk her results.  She denied having any chest pain, shortness of breath, cough or hemoptysis.  She denied having any fever or chills.  She has no nausea, vomiting, diarrhea or constipation.  She had repeat CT scan of the chest performed yesterday and she is here for evaluation and discussion of her risk her results.  MEDICAL HISTORY: Past Medical History:  Diagnosis Date  . 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 Asthma   . Cervical cancer ([place]) 1997   poorly differentiated cervical carcinoma. S/p TAH and radiation x6 wks   . Complication of anesthesia    took 2 days to wake up after 1 surgery       pt cants use a adult cath,needs peds. cath due to past injury  . Constipation    very worried about this  . Endometrial cancer ([place]) 1997   coincidental endometrial cancer also  . Hyperlipemia   . Hypertension   . Hypothyroidism   . Primary cancer of left upper lobe of lung ([place]) 08/01/2015  . Small bowel obstruction ([place])    12/2008, repeat SBO in 02/2011 also with terminal ilietis   . Thyroid disease   . Vertebral body hemangioma    T12    ALLERGIES:  is allergic to codeine; penicillins; prochlorperazine edisylate; fentanyl; percocet [oxycodone-acetaminophen]; prochlorperazine; and penicillin g sodium.  MEDICATIONS:  Current Outpatient  Medications  Medication Sig Dispense Refill  . acidophilus (RISAQUAD) CAPS Take 1 capsule by mouth daily.    Marland Kitchen albuterol (PROVENTIL HFA;VENTOLIN HFA) 108 (90 Base) MCG/ACT inhaler Inhale 2 puffs into the lungs every 6 (six) hours as needed for wheezing or shortness of breath. Reported on 06/29/2015    . benazepril-hydrochlorthiazide (LOTENSIN HCT) 20-12.5 MG per tablet Take 1 tablet by mouth daily.    . Biotin 10 MG TABS 1 tablet    . cholecalciferol (VITAMIN D) 1000 UNITS tablet Take 1,000 Units by mouth 2 (two) times daily.     . clobetasol ointment (TEMOVATE) 0.05 % Apply topically daily as needed.     . dicyclomine (BENTYL) 20 MG tablet Take 1 tablet (20 mg total) by mouth every 6 (six) hours as needed for spasms (for abdominal cramping). 20 tablet 0  . docusate sodium (COLACE) 100 MG capsule Take 100 mg by mouth 2 (two) times daily.    Marland Kitchen levothyroxine (SYNTHROID, LEVOTHROID) 75 MCG tablet Take 75 mcg by mouth daily.    Marland Kitchen loratadine (CLARITIN) 10 MG tablet Take 10 mg by mouth daily as needed for allergies.    . Magnesium 300 MG CAPS 1 capsule with a meal    . Multiple Vitamin (MULTI-VITAMINS) TABS as directed    . Omega-3 Fatty Acids (FISH OIL) 1000 MG CAPS 1 tablet    . simvastatin (ZOCOR) 20 MG tablet Take 20 mg by mouth every evening.  No current facility-administered medications for this visit.     SURGICAL HISTORY:  Past Surgical History:  Procedure Laterality Date  . ABDOMINAL HYSTERECTOMY  Feb 1997   TAH/BSO, pelvic and periaortic lymphadenectomy   . BACK SURGERY    . LYMPH NODE DISSECTION Left 08/01/2015   Procedure: LYMPH NODE DISSECTION;  Surgeon: Melrose Nakayama, MD;  Location: [facility];  Service: Thoracic;  Laterality: Left;  . PARTIAL NEPHRECTOMY     right  . SEGMENTECOMY Left 08/01/2015   Procedure: Left Upper Lobe SEGMENTECTOMY;  Surgeon: Melrose Nakayama, MD;  Location: [facility];  Service: Thoracic;  Laterality: Left;  . SKIN BIOPSY    . TUBAL LIGATION    .  TUMOR REMOVAL     meningioma  T 12  . VIDEO ASSISTED THORACOSCOPY (VATS)/WEDGE RESECTION Left 08/01/2015   Procedure: VIDEO ASSISTED THORACOSCOPY (VATS)/WEDGE RESECTION;  Surgeon: Melrose Nakayama, MD;  Location: [facility];  Service: Thoracic;  Laterality: Left;    REVIEW OF SYSTEMS:  A comprehensive review of systems was negative.   PHYSICAL EXAMINATION: General appearance: alert, cooperative and no distress Head: Normocephalic, without obvious abnormality, atraumatic Neck: no adenopathy, no JVD, supple, symmetrical, trachea midline and thyroid not enlarged, symmetric, no tenderness/mass/nodules Lymph nodes: Cervical, supraclavicular, and axillary nodes normal. Resp: clear to auscultation bilaterally Back: symmetric, no curvature. ROM normal. No CVA tenderness. Cardio: regular rate and rhythm, S1, S2 normal, no murmur, click, rub or gallop GI: soft, non-tender; bowel sounds normal; no masses,  no organomegaly Extremities: extremities normal, atraumatic, no cyanosis or edema  ECOG PERFORMANCE STATUS: 0 - Asymptomatic  Blood pressure (!) 170/71, pulse 71, temperature 98.5 F (36.9 C), temperature source Oral, resp. rate 18, height 5\' 5"  (1.651 m), weight 154 lb 4.8 oz (70 kg), SpO2 98 %.  LABORATORY DATA: Lab Results  Component Value Date   WBC 4.3 09/14/2017   HGB 11.7 09/14/2017   HCT 35.7 09/14/2017   MCV 67.4 (L) 09/14/2017   PLT 169 09/14/2017      Chemistry      Component Value Date/Time   NA 133 (L) 09/14/2017 0956   NA 136 09/18/2016 1536   K 4.2 09/14/2017 0956   K 3.9 09/18/2016 1536   CL 96 (L) 09/14/2017 0956   CO2 27 09/14/2017 0956   CO2 26 09/18/2016 1536   BUN 17 09/14/2017 0956   BUN 15.8 09/18/2016 1536   CREATININE 0.81 09/14/2017 0956   CREATININE 0.8 09/18/2016 1536      Component Value Date/Time   CALCIUM 9.2 09/14/2017 0956   CALCIUM 9.3 09/18/2016 1536   ALKPHOS 93 09/14/2017 0956   ALKPHOS 91 09/18/2016 1536   AST 19 09/14/2017 0956   AST  18 09/18/2016 1536   ALT 19 09/14/2017 0956   ALT 17 09/18/2016 1536   BILITOT 0.7 09/14/2017 0956   BILITOT 0.42 09/18/2016 1536       RADIOGRAPHIC STUDIES: Ct Chest W Contrast  Result Date: 09/14/2017 CLINICAL DATA:  Left lung cancer, diagnosed 2016, status post wedge resection. EXAM: CT CHEST WITH CONTRAST TECHNIQUE: Multidetector CT imaging of the chest was performed during intravenous contrast administration. CONTRAST:  44mL OMNIPAQUE IOHEXOL 300 MG/ML  SOLN COMPARISON:  PET-CT dated 03/23/2017. FINDINGS: Cardiovascular: The heart is normal in size. No pericardial effusion. No evidence of thoracic aortic aneurysm. Ectasia of the ascending thoracic aorta, measuring 3.8 cm. Atherosclerotic calcifications of the aortic arch. Coronary atherosclerosis of the LAD. Mediastinum/Nodes: No suspicious mediastinal lymphadenopathy. Visualized thyroid is unremarkable.  Lungs/Pleura: Status post left upper lobe wedge resection. Associated scarring. Lungs are otherwise clear.  No suspicious pulmonary nodules. No focal consolidation. No pleural effusion or pneumothorax. Upper Abdomen: Visualized upper abdomen is notable for right upper pole partial nephrectomy. Musculoskeletal: Degenerative changes of the thoracic spine. IMPRESSION: Status post left upper lobe wedge resection. No evidence of recurrent or metastatic disease. Aortic Atherosclerosis (ICD10-I70.0). Electronically Signed   By: Julian Hy M.D.   On: 09/14/2017 14:50    ASSESSMENT AND PLAN: This is a very pleasant 72 years old white female with history of stage IA non-small cell lung cancer status post lingular sparing left upper lobectomy with lymph node dissection in June 2017. The patient is currently on observation and she is feeling fine. Repeat CT scan of the chest showed no concerning findings for disease recurrence or metastatic disease. I recommended for the patient to continue on observation with repeat CT scan of the chest in 1 year  for restaging of her disease. She was advised to call immediately if she has any concerning symptoms in the interval. The patient voices understanding of current disease status and treatment options and is in agreement with the current care plan. All questions were answered. The patient knows to call the clinic with any problems, questions or concerns. We can certainly see the patient much sooner if necessary. I spent 10 minutes counseling the patient face to face. The total time spent in the appointment was 15 minutes.  Disclaimer: This note was dictated with voice recognition software. Similar sounding words can inadvertently be transcribed and may not be corrected upon review.

## 2017-09-15 NOTE — Telephone Encounter (Signed)
Scheduled appt per 8/6 los - sent reminder letter in the mail with appt date and time.

## 2017-09-22 ENCOUNTER — Inpatient Hospital Stay (HOSPITAL_BASED_OUTPATIENT_CLINIC_OR_DEPARTMENT_OTHER): Payer: Medicare Other | Admitting: Gynecology

## 2017-09-22 ENCOUNTER — Encounter: Payer: Self-pay | Admitting: Gynecology

## 2017-09-22 VITALS — BP 136/74 | HR 62 | Temp 98.7°F | Resp 20 | Ht 65.0 in | Wt 150.6 lb

## 2017-09-22 DIAGNOSIS — Z9221 Personal history of antineoplastic chemotherapy: Secondary | ICD-10-CM | POA: Diagnosis not present

## 2017-09-22 DIAGNOSIS — Z90722 Acquired absence of ovaries, bilateral: Secondary | ICD-10-CM

## 2017-09-22 DIAGNOSIS — Z923 Personal history of irradiation: Secondary | ICD-10-CM | POA: Diagnosis not present

## 2017-09-22 DIAGNOSIS — Z90710 Acquired absence of both cervix and uterus: Secondary | ICD-10-CM | POA: Diagnosis not present

## 2017-09-22 DIAGNOSIS — C3412 Malignant neoplasm of upper lobe, left bronchus or lung: Secondary | ICD-10-CM | POA: Diagnosis not present

## 2017-09-22 DIAGNOSIS — C519 Malignant neoplasm of vulva, unspecified: Secondary | ICD-10-CM | POA: Diagnosis not present

## 2017-09-22 NOTE — Patient Instructions (Signed)
Plan to follow up in three months or sooner if needed.  Please call for any questions, concerns, or new symptoms.

## 2017-09-22 NOTE — Progress Notes (Signed)
Consult Note: Gyn-Onc   [name] Richards 72 y.o. female  Chief Complaint  Patient presents with  . Vulvar cancer [facility])    Assessment : Recurrent vulvar cancer most recently having undergone a small modified radical vulvectomy of the right side on January 13, 2017.  Prior vulvar cancer status post modified posterior radical vulvectomy on 02/06/2015.    Past history of Poorly differentiated endometrial and cervical cancer 1997. No evidence of disease.  Intermittent partial small bowel obstruction (no recurrences over the past 12 months). 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  Recent chest CT shows no evidence of disease.  Plan:    Return to clinic in 3 months.   . Interval History:   Patient returns today as previously scheduled.   .  Overall she is feeling very good.  She denies any vulvar symptoms.  She has had no further episodes of intermittent small bowel obstruction.  Overall her functional status is excellent.   She just returned from an 11 day transAtlantic cruise to [place].   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 She underwent a total abdominal hysterectomy and bilateral salpingo-oophorectomy, pelvic and para-aortic lymphadenectomy. She received postoperative whole pelvis radiation therapy.  Her other primary gynecologic problem has been lichen sclerosus managed with when necessary clobetasol.  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  In December 2016 the patient was found to have a new primary squamous cell carcinoma the vulva undergoing a modified radical vulvectomy on 02/06/2015. She underwent a modified radical vulvectomy at [facility] on 02/06/2015. She was found  to have a 3 cm vulvar lesion of the posterior vulva extending into the posterior vagina. A rhomboid flap was required to close the incision. Patient had on pelvic postoperative course. She reports she's been doing well. She irrigates the wound twice a day with a tepid water and a blow dryer. She denies any drainage or pain or fever. She has been walking approximate half mile a day.  Final pathology showed some close surgical margins. However, the specimen margins were toward the time of surgery and it is my impression that we were widely around the primary cancer. I discussed these findings with the patient and we've agreed to monitor her closely but not recommend any radiation therapy to the vulva. The patient's agreement with this recommendation.  Recurrent vulvar cancer October 2018 managed by a small modified radical vulvectomy of the right side on January 13, 2017.  Depth of invasion was 4 mm all margins were negative.  Follow-up PET scan March 23, 2017 showed no evidence of metastatic disease.   Review of Systems:10 point review of systems is negative except as noted in interval history.   Vitals: Blood pressure 136/74, pulse 62, temperature 98.7 F (37.1 C), temperature source Oral, resp. rate 20, height 5\' 5"  (1.651 m), weight 150 lb 9.6 oz (68.3 kg), SpO2 100 %.  Physical Exam: General : The patient is a healthy woman in no acute distress.  HEENT: normocephalic, extraoccular movements normal; neck is supple without thyromegally  Lynphnodes: Supraclavicular and inguinal nodes not enlarged  Abdomen: Soft, non-tender, no ascites, no organomegally, no masses, no hernias  Pelvic:  EGBUS: Normal female mild lichen sclerosis on the anterior vulva.  . There are no lesions on the  vulva.  Patient is a small cystocele.  Vagina is atrophic.      Allergies  Allergen Reactions  . Codeine Other (See Comments)    Other reaction(s): amnesia Reporting amnesia  . Penicillins Hives, Rash and  Other (See Comments)    Has patient had a PCN reaction causing immediate rash, facial/tongue/throat swelling, SOB or lightheadedness with hypotension: NO Has patient had a PCN reaction causing SEVERE RASH INVOLVING MUCUS MEMBRANES OR SKIN NECROSIS: YES Has patient had a PCN reaction that REQUIRED HOSPITALIZATION : patient denies hospitalization Has patient had a PCN reaction occurring within the last 10 years: NO    . Prochlorperazine Edisylate Other (See Comments)    JAWS LOCKED EXTRAPYRAMIDAL Rx  . Fentanyl Other (See Comments)    Other reaction(s): low blood pressure hypotension  . Percocet [Oxycodone-Acetaminophen] Other (See Comments)    Made pt feel real funny, never wanting to take it again; near syncopal episode  . Prochlorperazine     Other reaction(s): extrapyramidal reaction Other reaction(s): extrapyramidal reaction  . Penicillin G Sodium Rash    Other reaction(s): rash    Past Medical History:  Diagnosis Date  . 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 Asthma   . Cervical cancer ([place]) 1997   poorly differentiated cervical carcinoma. S/p TAH and radiation x6 wks   . Complication of anesthesia    took 2 days to wake up after 1 surgery       pt cants use a adult cath,needs peds. cath due to past injury  . Constipation    very worried about this  . Endometrial cancer ([place]) 1997   coincidental endometrial cancer also  . Hyperlipemia   . Hypertension   . Hypothyroidism   . Primary cancer of left upper lobe of lung ([place]) 08/01/2015  . Small bowel obstruction ([place])    12/2008, repeat SBO in 02/2011 also with terminal ilietis   . Thyroid disease   . Vertebral body hemangioma    T12    Past Surgical History:  Procedure Laterality Date  . ABDOMINAL HYSTERECTOMY  Feb 1997   TAH/BSO, pelvic and periaortic lymphadenectomy   . BACK SURGERY    . LYMPH NODE DISSECTION Left 08/01/2015   Procedure: LYMPH NODE DISSECTION;  Surgeon: Melrose Nakayama,  MD;  Location: [facility];  Service: Thoracic;  Laterality: Left;  . PARTIAL NEPHRECTOMY     right  . SEGMENTECOMY Left 08/01/2015   Procedure: Left Upper Lobe SEGMENTECTOMY;  Surgeon: Melrose Nakayama, MD;  Location: [facility];  Service: Thoracic;  Laterality: Left;  . SKIN BIOPSY    . TUBAL LIGATION    . TUMOR REMOVAL     meningioma  T 12  . VIDEO ASSISTED THORACOSCOPY (VATS)/WEDGE RESECTION Left 08/01/2015   Procedure: VIDEO ASSISTED THORACOSCOPY (VATS)/WEDGE RESECTION;  Surgeon: Melrose Nakayama, MD;  Location[facility];  Service: Thoracic;  Laterality: Left;    Current Outpatient Medications  Medication Sig Dispense Refill  . acidophilus (RISAQUAD) CAPS Take 1 capsule by mouth daily.    . benazepril-hydrochlorthiazide (LOTENSIN HCT) 20-12.5 MG per tablet Take 1 tablet by mouth daily.    . Biotin 10 MG TABS 1 tablet    . cholecalciferol (VITAMIN D) 1000 UNITS tablet Take 1,000 Units by mouth 2 (two) times daily.     . clobetasol ointment (TEMOVATE) 0.05 % Apply topically daily as needed.     . dicyclomine (BENTYL) 20 MG tablet Take 1 tablet (20 mg  total) by mouth every 6 (six) hours as needed for spasms (for abdominal cramping). 20 tablet 0  . docusate sodium (COLACE) 100 MG capsule Take 100 mg by mouth 2 (two) times daily.    Marland Kitchen levothyroxine (SYNTHROID, LEVOTHROID) 75 MCG tablet Take 75 mcg by mouth daily.    Marland Kitchen loratadine (CLARITIN) 10 MG tablet Take 10 mg by mouth daily as needed for allergies.    . Magnesium 300 MG CAPS 1 capsule with a meal    . Multiple Vitamin (MULTI-VITAMINS) TABS as directed    . Omega-3 Fatty Acids (FISH OIL) 1000 MG CAPS 2 (two) times daily.     . polyethylene glycol (MIRALAX / GLYCOLAX) packet Take 17 g by mouth daily. 1/2 dose once a day at bedtime.    . simvastatin (ZOCOR) 20 MG tablet Take 20 mg by mouth every evening.     No current facility-administered medications for this visit.     Social History   Socioeconomic History  . Marital status: Divorced     Spouse name: Not on file  . Number of children: Not on file  . Years of education: Not on file  . Highest education level: Not on file  Occupational History  . Not on file  Social Needs  . Financial resource strain: Not on file  . Food insecurity:    Worry: Not on file    Inability: Not on file  . Transportation needs:    Medical: Not on file    Non-medical: Not on file  Tobacco Use  . Smoking status: Never Smoker  . Smokeless tobacco: Never Used  Substance and Sexual Activity  . Alcohol use: No  . Drug use: No  . Sexual activity: Never  Lifestyle  . Physical activity:    Days per week: Not on file    Minutes per session: Not on file  . Stress: Not on file  Relationships  . Social connections:    Talks on phone: Not on file    Gets together: Not on file    Attends religious service: Not on file    Active member of club or organization: Not on file    Attends meetings of clubs or organizations: Not on file    Relationship status: Not on file  . Intimate partner violence:    Fear of current or ex partner: Not on file    Emotionally abused: Not on file    Physically abused: Not on file    Forced sexual activity: Not on file  Other Topics Concern  . Not on file  Social History Narrative   Lives at home alone, works out at [employer] 6 days a week, still very active. Has an ex-husband who she is in contact with. Son died of an MI and another son in [place].     Family History  Problem Relation Age of Onset  . Hypertension Other   . Diabetes Other   . Stroke Other   . CAD Other       Marti Sleigh, MD 09/22/2017, 9:41 AM       Consult Note: Gyn-Onc

## 2017-09-25 ENCOUNTER — Other Ambulatory Visit: Payer: Medicare Other

## 2017-09-29 ENCOUNTER — Ambulatory Visit: Payer: Medicare Other | Admitting: Internal Medicine

## 2017-12-22 ENCOUNTER — Encounter: Payer: Self-pay | Admitting: Gynecology

## 2017-12-22 ENCOUNTER — Inpatient Hospital Stay: Payer: Medicare Other | Attending: Gynecology | Admitting: Gynecology

## 2017-12-22 VITALS — BP 140/72 | HR 58 | Temp 98.6°F | Resp 20 | Ht 65.0 in | Wt 153.4 lb

## 2017-12-22 DIAGNOSIS — Z08 Encounter for follow-up examination after completed treatment for malignant neoplasm: Secondary | ICD-10-CM | POA: Diagnosis present

## 2017-12-22 DIAGNOSIS — Z8544 Personal history of malignant neoplasm of other female genital organs: Secondary | ICD-10-CM | POA: Diagnosis not present

## 2017-12-22 DIAGNOSIS — Z90710 Acquired absence of both cervix and uterus: Secondary | ICD-10-CM

## 2017-12-22 DIAGNOSIS — Z923 Personal history of irradiation: Secondary | ICD-10-CM | POA: Diagnosis not present

## 2017-12-22 DIAGNOSIS — C519 Malignant neoplasm of vulva, unspecified: Secondary | ICD-10-CM

## 2017-12-22 DIAGNOSIS — Z90722 Acquired absence of ovaries, bilateral: Secondary | ICD-10-CM | POA: Diagnosis not present

## 2017-12-22 NOTE — Progress Notes (Signed)
Consult Note: Gyn-Onc   [name] Richards 72 y.o. female  No chief complaint on file.   Assessment : Recurrent vulvar cancer most recently having undergone a small modified radical vulvectomy of the right side on January 13, 2017.  Prior vulvar cancer status post modified posterior radical vulvectomy on 02/06/2015.    Past history of Poorly differentiated endometrial and cervical cancer 1997. No evidence of disease.  Intermittent partial small bowel obstruction (no recurrences over the past 36 months). 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  Being followed by pulmonary oncology..  Plan:    Return to clinic in 3 months.   . Interval History:   Patient returns today as previously scheduled.   .  Overall she is feeling very good.  She denies any vulvar symptoms.  She has had no further episodes of intermittent small bowel obstruction.  Overall her functional status is excellent.    She has no other GI GU or pelvic symptoms.   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 She underwent a total abdominal hysterectomy and bilateral salpingo-oophorectomy, pelvic and para-aortic lymphadenectomy. She received postoperative whole pelvis radiation therapy.  Her other primary gynecologic problem has been lichen sclerosus managed with when necessary clobetasol.  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  In December 2016 the patient was found to have a new primary squamous cell carcinoma the vulva undergoing a modified radical vulvectomy on 02/06/2015. She underwent a modified radical vulvectomy at [facility] on 02/06/2015. She was found to have a 3 cm vulvar lesion of the posterior vulva extending into the  posterior vagina. A rhomboid flap was required to close the incision. Patient had on pelvic postoperative course. She reports she's been doing well. She irrigates the wound twice a day with a tepid water and a blow dryer. She denies any drainage or pain or fever. She has been walking approximate half mile a day.  Final pathology showed some close surgical margins. However, the specimen margins were toward the time of surgery and it is my impression that we were widely around the primary cancer. I discussed these findings with the patient and we've agreed to monitor her closely but not recommend any radiation therapy to the vulva. The patient's agreement with this recommendation.  Recurrent vulvar cancer October 2018 managed by a small modified radical vulvectomy of the right side on January 13, 2017.  Depth of invasion was 4 mm all margins were negative.  Follow-up PET scan March 23, 2017 showed no evidence of metastatic disease.   Review of Systems:10 point review of systems is negative except as noted in interval history.   Vitals: Blood pressure 140/72, pulse (!) 58, temperature 98.6 F (37 C), temperature source Oral, resp. rate 20, height 5\' 5"  (1.651 m), weight 153 lb 6.4 oz (69.6 kg), SpO2 100 %.  Physical Exam: General : The patient is a healthy woman in no acute distress.  HEENT: normocephalic, extraoccular movements normal; neck is supple without thyromegally  Lynphnodes: Supraclavicular and inguinal nodes not enlarged  Abdomen: Soft, non-tender, no ascites, no organomegally, no masses, no hernias  Pelvic:  EGBUS: Normal female mild lichen sclerosis on the anterior vulva.  . There are no lesions on the vulva.  Patient is a small cystocele.  Vagina is  atrophic.      Allergies  Allergen Reactions  . Codeine Other (See Comments)    Other reaction(s): amnesia Reporting amnesia  . Penicillins Hives, Rash and Other (See Comments)    Has patient had a PCN reaction causing  immediate rash, facial/tongue/throat swelling, SOB or lightheadedness with hypotension: NO Has patient had a PCN reaction causing SEVERE RASH INVOLVING MUCUS MEMBRANES OR SKIN NECROSIS: YES Has patient had a PCN reaction that REQUIRED HOSPITALIZATION : patient denies hospitalization Has patient had a PCN reaction occurring within the last 10 years: NO    . Prochlorperazine Edisylate Other (See Comments)    JAWS LOCKED EXTRAPYRAMIDAL Rx  . Fentanyl Other (See Comments)    Other reaction(s): low blood pressure hypotension  . Percocet [Oxycodone-Acetaminophen] Other (See Comments)    Made pt feel real funny, never wanting to take it again; near syncopal episode  . Prochlorperazine     Other reaction(s): extrapyramidal reaction Other reaction(s): extrapyramidal reaction  . Penicillin G Sodium Rash    Other reaction(s): rash    Past Medical History:  Diagnosis Date  . 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 Asthma   . Cervical cancer ([place]) 1997   poorly differentiated cervical carcinoma. S/p TAH and radiation x6 wks   . Complication of anesthesia    took 2 days to wake up after 1 surgery       pt cants use a adult cath,needs peds. cath due to past injury  . Constipation    very worried about this  . Endometrial cancer ([place]) 1997   coincidental endometrial cancer also  . Hyperlipemia   . Hypertension   . Hypothyroidism   . Primary cancer of left upper lobe of lung ([place]) 08/01/2015  . Small bowel obstruction ([place])    12/2008, repeat SBO in 02/2011 also with terminal ilietis   . Thyroid disease   . Vertebral body hemangioma    T12    Past Surgical History:  Procedure Laterality Date  . ABDOMINAL HYSTERECTOMY  Feb 1997   TAH/BSO, pelvic and periaortic lymphadenectomy   . BACK SURGERY    . LYMPH NODE DISSECTION Left 08/01/2015   Procedure: LYMPH NODE DISSECTION;  Surgeon: Melrose Nakayama, MD;  Location: [facility];  Service: Thoracic;  Laterality: Left;  .  PARTIAL NEPHRECTOMY     right  . SEGMENTECOMY Left 08/01/2015   Procedure: Left Upper Lobe SEGMENTECTOMY;  Surgeon: Melrose Nakayama, MD;  Location: [facility];  Service: Thoracic;  Laterality: Left;  . SKIN BIOPSY    . TUBAL LIGATION    . TUMOR REMOVAL     meningioma  T 12  . VIDEO ASSISTED THORACOSCOPY (VATS)/WEDGE RESECTION Left 08/01/2015   Procedure: VIDEO ASSISTED THORACOSCOPY (VATS)/WEDGE RESECTION;  Surgeon: Melrose Nakayama, MD;  [facility];  Service: Thoracic;  Laterality: Left;    Current Outpatient Medications  Medication Sig Dispense Refill  . acidophilus (RISAQUAD) CAPS Take 1 capsule by mouth daily.    . benazepril-hydrochlorthiazide (LOTENSIN HCT) 20-12.5 MG per tablet Take 1 tablet by mouth daily.    . Biotin 10 MG TABS 1 tablet    . cholecalciferol (VITAMIN D) 1000 UNITS tablet Take 1,000 Units by mouth 2 (two) times daily.     . clobetasol ointment (TEMOVATE) 0.05 % Apply topically daily as needed.     . dicyclomine (BENTYL) 20 MG tablet Take 1 tablet (20 mg total) by mouth every 6 (six) hours as needed for  spasms (for abdominal cramping). 20 tablet 0  . docusate sodium (COLACE) 100 MG capsule Take 100 mg by mouth 2 (two) times daily.    Marland Kitchen levothyroxine (SYNTHROID, LEVOTHROID) 75 MCG tablet Take 75 mcg by mouth daily.    Marland Kitchen loratadine (CLARITIN) 10 MG tablet Take 10 mg by mouth daily as needed for allergies.    . Magnesium 300 MG CAPS 1 capsule with a meal    . Multiple Vitamin (MULTI-VITAMINS) TABS as directed    . Omega-3 Fatty Acids (FISH OIL) 1000 MG CAPS 2 (two) times daily.     . polyethylene glycol (MIRALAX / GLYCOLAX) packet Take 17 g by mouth daily. 1/2 dose once a day at bedtime.    . simvastatin (ZOCOR) 20 MG tablet Take 20 mg by mouth every evening.     No current facility-administered medications for this visit.     Social History   Socioeconomic History  . Marital status: Divorced    Spouse name: Not on file  . Number of children: Not on file   . Years of education: Not on file  . Highest education level: Not on file  Occupational History  . Not on file  Social Needs  . Financial resource strain: Not on file  . Food insecurity:    Worry: Not on file    Inability: Not on file  . Transportation needs:    Medical: Not on file    Non-medical: Not on file  Tobacco Use  . Smoking status: Never Smoker  . Smokeless tobacco: Never Used  Substance and Sexual Activity  . Alcohol use: No  . Drug use: No  . Sexual activity: Never  Lifestyle  . Physical activity:    Days per week: Not on file    Minutes per session: Not on file  . Stress: Not on file  Relationships  . Social connections:    Talks on phone: Not on file    Gets together: Not on file    Attends religious service: Not on file    Active member of club or organization: Not on file    Attends meetings of clubs or organizations: Not on file    Relationship status: Not on file  . Intimate partner violence:    Fear of current or ex partner: Not on file    Emotionally abused: Not on file    Physically abused: Not on file    Forced sexual activity: Not on file  Other Topics Concern  . Not on file  Social History Narrative   Lives at home alone, works out at [employer] 6 days a week, still very active. Has an ex-husband who she is in contact with. Son died of an MI and another son in [place].     Family History  Problem Relation Age of Onset  . Hypertension Other   . Diabetes Other   . Stroke Other   . CAD Other       Marti Sleigh, MD 12/22/2017, 9:47 AM       Consult Note: Gyn-Onc

## 2017-12-22 NOTE — Patient Instructions (Signed)
Return to see me in 3 months.

## 2018-04-20 ENCOUNTER — Encounter: Payer: Self-pay | Admitting: Gynecology

## 2018-04-20 ENCOUNTER — Telehealth: Payer: Self-pay | Admitting: Internal Medicine

## 2018-04-20 ENCOUNTER — Inpatient Hospital Stay: Payer: Medicare Other | Attending: Gynecology | Admitting: Gynecology

## 2018-04-20 VITALS — BP 153/81 | HR 71 | Temp 98.5°F | Resp 18 | Ht 65.0 in | Wt 152.3 lb

## 2018-04-20 DIAGNOSIS — Z90722 Acquired absence of ovaries, bilateral: Secondary | ICD-10-CM | POA: Diagnosis not present

## 2018-04-20 DIAGNOSIS — Z8544 Personal history of malignant neoplasm of other female genital organs: Secondary | ICD-10-CM | POA: Diagnosis not present

## 2018-04-20 DIAGNOSIS — C519 Malignant neoplasm of vulva, unspecified: Secondary | ICD-10-CM

## 2018-04-20 DIAGNOSIS — Z90710 Acquired absence of both cervix and uterus: Secondary | ICD-10-CM | POA: Insufficient documentation

## 2018-04-20 DIAGNOSIS — E785 Hyperlipidemia, unspecified: Secondary | ICD-10-CM | POA: Insufficient documentation

## 2018-04-20 DIAGNOSIS — I10 Essential (primary) hypertension: Secondary | ICD-10-CM | POA: Insufficient documentation

## 2018-04-20 DIAGNOSIS — Z79899 Other long term (current) drug therapy: Secondary | ICD-10-CM | POA: Insufficient documentation

## 2018-04-20 DIAGNOSIS — Z8541 Personal history of malignant neoplasm of cervix uteri: Secondary | ICD-10-CM | POA: Insufficient documentation

## 2018-04-20 DIAGNOSIS — J45909 Unspecified asthma, uncomplicated: Secondary | ICD-10-CM | POA: Diagnosis not present

## 2018-04-20 DIAGNOSIS — E039 Hypothyroidism, unspecified: Secondary | ICD-10-CM | POA: Diagnosis not present

## 2018-04-20 NOTE — Patient Instructions (Signed)
Follow up with Dr. Fermin Schwab in 6 months. Please call in June to schedule your appointment. [phone].

## 2018-04-20 NOTE — Telephone Encounter (Signed)
Patient walk-in, needs to reschedule 8/5 MD appt. Per MM, ok to schedule appt same day as CT scan. Moved 8/5 appt to 8/3.

## 2018-04-20 NOTE — Progress Notes (Signed)
Consult Note: Gyn-Onc   [name] Richards 73 y.o. female  Chief Complaint  Patient presents with  . Vulvar cancer [facility])    Assessment : Recurrent vulvar cancer most recently having undergone a small modified radical vulvectomy of the right side on January 13, 2017.  Prior vulvar cancer status post modified posterior radical vulvectomy on 02/06/2015.    Past history of Poorly differentiated endometrial and cervical cancer 1997. No evidence of disease.  Intermittent partial small bowel obstruction (no recurrences over the past 40 months now that the patient is modified her diet.). 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  Being followed by pulmonary oncology..  Plan:    Return to clinic in 6 months.   . Interval History:   Patient returns today as previously scheduled.   .  Overall she is feeling very good.  She denies any vulvar symptoms.  She has had no further episodes of intermittent small bowel obstruction.  Overall her functional status is excellent.    She has no other GI GU or pelvic symptoms.  She has not had any vulvar symptoms and is not use clobetasol in a couple of years.   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 She underwent a total abdominal hysterectomy and bilateral salpingo-oophorectomy, pelvic and para-aortic lymphadenectomy. She received postoperative whole pelvis radiation therapy.  Her other primary gynecologic problem has been lichen sclerosus managed with when necessary clobetasol.  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  In December 2016 the patient was found to have a new primary squamous cell carcinoma the vulva undergoing a modified radical vulvectomy on  02/06/2015. She underwent a modified radical vulvectomy at [facility] on 02/06/2015. She was found to have a 3 cm vulvar lesion of the posterior vulva extending into the posterior vagina. A rhomboid flap was required to close the incision. Patient had on pelvic postoperative course. She reports she's been doing well. She irrigates the wound twice a day with a tepid water and a blow dryer. She denies any drainage or pain or fever. She has been walking approximate half mile a day.  Final pathology showed some close surgical margins. However, the specimen margins were toward the time of surgery and it is my impression that we were widely around the primary cancer. I discussed these findings with the patient and we've agreed to monitor her closely but not recommend any radiation therapy to the vulva. The patient's agreement with this recommendation.  Recurrent vulvar cancer October 2018 managed by a small modified radical vulvectomy of the right side on January 13, 2017.  Depth of invasion was 4 mm all margins were negative.  Follow-up PET scan March 23, 2017 showed no evidence of metastatic disease.   Review of Systems:10 point review of systems is negative except as noted in interval history.   Vitals: Blood pressure (!) 153/81, pulse 71, temperature 98.5 F (36.9 C), temperature source Oral, resp. rate 18, height 5\' 5"  (1.651 m), weight 152 lb 4.8 oz (69.1 kg), SpO2 100 %.  Physical Exam: General : The patient is a healthy woman in no acute distress.  HEENT: normocephalic, extraoccular movements normal; neck is supple without thyromegally  Lynphnodes: Supraclavicular and inguinal nodes not enlarged  Abdomen: Soft, non-tender, no ascites, no organomegally, no masses,  no hernias  Pelvic:  EGBUS: Normal female mild lichen sclerosis on the anterior vulva.  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  Her graph.  . There are no lesions on the vulva.  Patient is a small cystocele.  Vagina is and  foreshortened atrophic.      Allergies  Allergen Reactions  . Codeine Other (See Comments)    Other reaction(s): amnesia Reporting amnesia  . Penicillins Hives, Rash and Other (See Comments)    Has patient had a PCN reaction causing immediate rash, facial/tongue/throat swelling, SOB or lightheadedness with hypotension: NO Has patient had a PCN reaction causing SEVERE RASH INVOLVING MUCUS MEMBRANES OR SKIN NECROSIS: YES Has patient had a PCN reaction that REQUIRED HOSPITALIZATION : patient denies hospitalization Has patient had a PCN reaction occurring within the last 10 years: NO    . Prochlorperazine Edisylate Other (See Comments)    JAWS LOCKED EXTRAPYRAMIDAL Rx  . Fentanyl Other (See Comments)    Other reaction(s): low blood pressure hypotension  . Percocet [Oxycodone-Acetaminophen] Other (See Comments)    Made pt feel real funny, never wanting to take it again; near syncopal episode  . Prochlorperazine     Other reaction(s): extrapyramidal reaction Other reaction(s): extrapyramidal reaction  . Penicillin G Sodium Rash    Other reaction(s): rash    Past Medical History:  Diagnosis Date  . 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 Asthma   . Cervical cancer ([place]) 1997   poorly differentiated cervical carcinoma. S/p TAH and radiation x6 wks   . Complication of anesthesia    took 2 days to wake up after 1 surgery       pt cants use a adult cath,needs peds. cath due to past injury  . Constipation    very worried about this  . Endometrial cancer ([place]) 1997   coincidental endometrial cancer also  . Hyperlipemia   . Hypertension   . Hypothyroidism   . Primary cancer of left upper lobe of lung ([place]) 08/01/2015  . Small bowel obstruction ([place])    12/2008, repeat SBO in 02/2011 also with terminal ilietis   . Thyroid disease   . Vertebral body hemangioma    T12    Past Surgical History:  Procedure Laterality Date  . ABDOMINAL HYSTERECTOMY  Feb 1997    TAH/BSO, pelvic and periaortic lymphadenectomy   . BACK SURGERY    . LYMPH NODE DISSECTION Left 08/01/2015   Procedure: LYMPH NODE DISSECTION;  Surgeon: Melrose Nakayama, MD;  Location: [facility];  Service: Thoracic;  Laterality: Left;  . PARTIAL NEPHRECTOMY     right  . SEGMENTECOMY Left 08/01/2015   Procedure: Left Upper Lobe SEGMENTECTOMY;  Surgeon: Melrose Nakayama, MD;  Location: [facility];  Service: Thoracic;  Laterality: Left;  . SKIN BIOPSY    . TUBAL LIGATION    . TUMOR REMOVAL     meningioma  T 12  . VIDEO ASSISTED THORACOSCOPY (VATS)/WEDGE RESECTION Left 08/01/2015   Procedure: VIDEO ASSISTED THORACOSCOPY (VATS)/WEDGE RESECTION;  Surgeon: Melrose Nakayama, MD;  [facility];  Service: Thoracic;  Laterality: Left;    Current Outpatient Medications  Medication Sig Dispense Refill  . acidophilus (RISAQUAD) CAPS Take 1 capsule by mouth daily.    Marland Kitchen amLODipine (NORVASC) 2.5 MG tablet TK 1 T PO QD    . benazepril-hydrochlorthiazide (LOTENSIN HCT) 20-25 MG tablet benazepril 20 mg-hydrochlorothiazide 25 mg tablet    . Biotin 10 MG TABS 1 tablet    .  cholecalciferol (VITAMIN D) 1000 UNITS tablet Take 1,000 Units by mouth 2 (two) times daily.     . clobetasol ointment (TEMOVATE) 0.05 % Apply topically daily as needed.     . dicyclomine (BENTYL) 20 MG tablet Take 1 tablet (20 mg total) by mouth every 6 (six) hours as needed for spasms (for abdominal cramping). 20 tablet 0  . docusate sodium (COLACE) 100 MG capsule Take 100 mg by mouth 2 (two) times daily.    . fexofenadine (ALLEGRA) 180 MG tablet Take 180 mg by mouth daily as needed for allergies or rhinitis.    . Magnesium 300 MG CAPS 1 capsule with a meal    . Multiple Vitamin (MULTI-VITAMINS) TABS as directed    . Omega-3 Fatty Acids (FISH OIL) 1000 MG CAPS 2 (two) times daily.     . polyethylene glycol (MIRALAX / GLYCOLAX) packet Take 17 g by mouth daily. 1/2 dose once a day at bedtime.    . simvastatin (ZOCOR) 20 MG tablet  Take 20 mg by mouth every evening.    Marland Kitchen levothyroxine (SYNTHROID, LEVOTHROID) 75 MCG tablet Take 75 mcg by mouth daily.     No current facility-administered medications for this visit.     Social History   Socioeconomic History  . Marital status: Divorced    Spouse name: Not on file  . Number of children: Not on file  . Years of education: Not on file  . Highest education level: Not on file  Occupational History  . Not on file  Social Needs  . Financial resource strain: Not on file  . Food insecurity:    Worry: Not on file    Inability: Not on file  . Transportation needs:    Medical: Not on file    Non-medical: Not on file  Tobacco Use  . Smoking status: Never Smoker  . Smokeless tobacco: Never Used  Substance and Sexual Activity  . Alcohol use: No  . Drug use: No  . Sexual activity: Never  Lifestyle  . Physical activity:    Days per week: Not on file    Minutes per session: Not on file  . Stress: Not on file  Relationships  . Social connections:    Talks on phone: Not on file    Gets together: Not on file    Attends religious service: Not on file    Active member of club or organization: Not on file    Attends meetings of clubs or organizations: Not on file    Relationship status: Not on file  . Intimate partner violence:    Fear of current or ex partner: Not on file    Emotionally abused: Not on file    Physically abused: Not on file    Forced sexual activity: Not on file  Other Topics Concern  . Not on file  Social History Narrative   Lives at home alone, works out at [employer] 6 days a week, still very active. Has an ex-husband who she is in contact with. Son died of an MI and another son in [place].     Family History  Problem Relation Age of Onset  . Hypertension Other   . Diabetes Other   . Stroke Other   . CAD Other       Marti Sleigh, MD 04/20/2018, 10:06 AM       Consult Note: Gyn-Onc

## 2018-05-31 ENCOUNTER — Telehealth: Payer: Self-pay | Admitting: *Deleted

## 2018-05-31 NOTE — Telephone Encounter (Signed)
Per Dr. Fermin Schwab I have called and left the patient a message to call the office. Dr. Fermin Schwab wants the patient to be seen for bleeding issues.

## 2018-05-31 NOTE — Telephone Encounter (Signed)
Patient called back and was scheduled to see Dr. Denman George on 4/22. Patient per screened for the appt. Patient has no signs/symptoms, traveled or exposure to COVID-19. The patient has also not been around anyone sick, that's traveled or had any exposure. Explained that she will be asked this questions again to Wednesday at the front desk along with a temperature check. Also explained the parking issues.

## 2018-06-02 ENCOUNTER — Encounter: Payer: Self-pay | Admitting: Gynecologic Oncology

## 2018-06-02 ENCOUNTER — Other Ambulatory Visit: Payer: Self-pay

## 2018-06-02 ENCOUNTER — Inpatient Hospital Stay: Payer: Medicare Other | Attending: Gynecology | Admitting: Gynecologic Oncology

## 2018-06-02 VITALS — BP 150/77 | HR 82 | Temp 99.0°F | Resp 18 | Ht 65.0 in | Wt 155.8 lb

## 2018-06-02 DIAGNOSIS — C519 Malignant neoplasm of vulva, unspecified: Secondary | ICD-10-CM | POA: Insufficient documentation

## 2018-06-02 DIAGNOSIS — Z90710 Acquired absence of both cervix and uterus: Secondary | ICD-10-CM | POA: Diagnosis not present

## 2018-06-02 DIAGNOSIS — Z923 Personal history of irradiation: Secondary | ICD-10-CM

## 2018-06-02 DIAGNOSIS — C3492 Malignant neoplasm of unspecified part of left bronchus or lung: Secondary | ICD-10-CM

## 2018-06-02 DIAGNOSIS — Z8544 Personal history of malignant neoplasm of other female genital organs: Secondary | ICD-10-CM | POA: Insufficient documentation

## 2018-06-02 DIAGNOSIS — Z90722 Acquired absence of ovaries, bilateral: Secondary | ICD-10-CM | POA: Diagnosis not present

## 2018-06-02 NOTE — Progress Notes (Signed)
Follow-up Note: Gyn-Onc   [name] Richards 73 y.o. female  Chief Complaint  Patient presents with  . Vulvar cancer ([place])  . Vaginal Bleeding    Assessment : Recurrent vulvar cancer most recently having undergone a small modified radical vulvectomy of the right side on January 13, 2017.  Prior vulvar cancer status post modified posterior radical vulvectomy on 02/06/2015.    Past history of Poorly differentiated endometrial and cervical cancer 1997. No evidence of disease.  Intermittent partial small bowel obstruction (no recurrences over the past 40 months now that the patient is modified her diet.). 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  Being followed by pulmonary oncology.  Vulvar bleeding likely from atrophy and abrasion, no visible lesions.   Plan:    Return to clinic in 6 months.   Atrophy on vulva - recommend ointment with ambulation. Offered prescription of topical estrogen. Declined estrogen therapy.  . Interval History:   Patient returns today as previously scheduled.   .  Overall she is feeling very good.  She denies any vulvar symptoms.  She has had no further episodes of intermittent small bowel obstruction.  Overall her functional status is excellent.    She has no other GI GU or pelvic symptoms.  She has not had any vulvar symptoms and is not use clobetasol in a couple of years.   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 She underwent a total abdominal hysterectomy and bilateral salpingo-oophorectomy, pelvic and para-aortic lymphadenectomy. She received postoperative whole pelvis radiation therapy.  Her other primary gynecologic problem has been lichen sclerosus managed with when necessary clobetasol.  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  In December 2016 the patient was found to have a new primary squamous cell carcinoma the vulva undergoing a modified radical vulvectomy on 02/06/2015. She underwent a modified radical vulvectomy at [facility] on 02/06/2015. She was found to have a 3 cm vulvar lesion of the posterior vulva extending into the posterior vagina. A rhomboid flap was required to close the incision. Patient had on pelvic postoperative course. She reports she's been doing well. She irrigates the wound twice a day with a tepid water and a blow dryer. She denies any drainage or pain or fever. She has been walking approximate half mile a day.  Final pathology showed some close surgical margins. However, the specimen margins were toward the time of surgery and it is my impression that we were widely around the primary cancer. I discussed these findings with the patient and we've agreed to monitor her closely but not recommend any radiation therapy to the vulva. The patient's agreement with this recommendation.  Recurrent vulvar cancer October 2018 managed by a small modified radical vulvectomy of the right side on January 13, 2017.  Depth of invasion was 4 mm all margins were negative.  Follow-up PET scan March 23, 2017 showed no evidence of metastatic disease.   Review of Systems:10 point review of systems is negative except as noted in interval history.   Vitals: Blood pressure (!) 150/77, pulse 82, temperature 99 F (37.2 C), temperature source Oral, resp. rate 18, height 5\' 5"  (1.651 m), weight 155 lb 12.8 oz (70.7 kg), SpO2 100 %.  Physical Exam: General : The patient is a healthy woman  in no acute distress.  HEENT: normocephalic, extraoccular movements normal; neck is supple without thyromegally  Lynphnodes: Supraclavicular and inguinal nodes not enlarged  Abdomen: Soft, non-tender, no ascites, no organomegally, no masses, no hernias   Pelvic:  EGBUS: Normal female mild lichen sclerosis on the anterior vulva.  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 . There are no lesions on the vulva. Acetic acid applied - no lesions. Speculum inserted - agglutination, no lesions.   Patient is a small cystocele.  Vagina is and foreshortened atrophic.      Allergies  Allergen Reactions  . Codeine Other (See Comments)    Other reaction(s): amnesia Reporting amnesia  . Penicillins Hives, Rash and Other (See Comments)    Has patient had a PCN reaction causing immediate rash, facial/tongue/throat swelling, SOB or lightheadedness with hypotension: NO Has patient had a PCN reaction causing SEVERE RASH INVOLVING MUCUS MEMBRANES OR SKIN NECROSIS: YES Has patient had a PCN reaction that REQUIRED HOSPITALIZATION : patient denies hospitalization Has patient had a PCN reaction occurring within the last 10 years: NO    . Prochlorperazine Edisylate Other (See Comments)    JAWS LOCKED EXTRAPYRAMIDAL Rx  . Fentanyl Other (See Comments)    Other reaction(s): low blood pressure hypotension  . Percocet [Oxycodone-Acetaminophen] Other (See Comments)    Made pt feel real funny, never wanting to take it again; near syncopal episode  . Prochlorperazine     Other reaction(s): extrapyramidal reaction Other reaction(s): extrapyramidal reaction  . Penicillin G Sodium Rash    Other reaction(s): rash    Past Medical History:  Diagnosis Date  . 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 Asthma   . Cervical cancer ([place]) 1997   poorly differentiated cervical carcinoma. S/p TAH and radiation x6 wks   . Complication of anesthesia    took 2 days to wake up after 1 surgery       pt cants use a adult cath,needs peds. cath due to past injury  . Constipation    very worried about this  . Endometrial cancer ([place]) 1997   coincidental endometrial cancer also  . Hyperlipemia   . Hypertension   . Hypothyroidism   . Primary cancer of left  upper lobe of lung ([place]) 08/01/2015  . Small bowel obstruction ([place])    12/2008, repeat SBO in 02/2011 also with terminal ilietis   . Thyroid disease   . Vertebral body hemangioma    T12    Past Surgical History:  Procedure Laterality Date  . ABDOMINAL HYSTERECTOMY  Feb 1997   TAH/BSO, pelvic and periaortic lymphadenectomy   . BACK SURGERY    . LYMPH NODE DISSECTION Left 08/01/2015   Procedure: LYMPH NODE DISSECTION;  Surgeon: Melrose Nakayama, MD;  Location: [facility];  Service: Thoracic;  Laterality: Left;  . PARTIAL NEPHRECTOMY     right  . SEGMENTECOMY Left 08/01/2015   Procedure: Left Upper Lobe SEGMENTECTOMY;  Surgeon: Melrose Nakayama, MD;  Location: [facility];  Service: Thoracic;  Laterality: Left;  . SKIN BIOPSY    . TUBAL LIGATION    . TUMOR REMOVAL     meningioma  T 12  . VIDEO ASSISTED THORACOSCOPY (VATS)/WEDGE RESECTION Left 08/01/2015   Procedure: VIDEO ASSISTED THORACOSCOPY (VATS)/WEDGE RESECTION;  Surgeon: Melrose Nakayama, MD;  Location: [facility];  Service: Thoracic;  Laterality: Left;    Current Outpatient Medications  Medication Sig Dispense Refill  . acidophilus (RISAQUAD) CAPS Take 1 capsule by mouth daily.    Marland Kitchen  amLODipine (NORVASC) 2.5 MG tablet TK 1 T PO QD    . benazepril-hydrochlorthiazide (LOTENSIN HCT) 20-25 MG tablet benazepril 20 mg-hydrochlorothiazide 25 mg tablet    . Biotin 10 MG TABS 1 tablet    . cholecalciferol (VITAMIN D) 1000 UNITS tablet Take 1,000 Units by mouth 2 (two) times daily.     . clobetasol ointment (TEMOVATE) 0.05 % Apply topically daily as needed.     . dicyclomine (BENTYL) 20 MG tablet Take 1 tablet (20 mg total) by mouth every 6 (six) hours as needed for spasms (for abdominal cramping). 20 tablet 0  . docusate sodium (COLACE) 100 MG capsule Take 100 mg by mouth 2 (two) times daily.    . fexofenadine (ALLEGRA) 180 MG tablet Take 180 mg by mouth daily as needed for allergies or rhinitis.    Marland Kitchen levothyroxine (SYNTHROID, LEVOTHROID) 75  MCG tablet Take 75 mcg by mouth daily.    . Magnesium 300 MG CAPS 1 capsule with a meal    . Multiple Vitamin (MULTI-VITAMINS) TABS as directed    . Omega-3 Fatty Acids (FISH OIL) 1000 MG CAPS 2 (two) times daily.     . polyethylene glycol (MIRALAX / GLYCOLAX) packet Take 17 g by mouth daily. 1/2 dose once a day at bedtime.    . simvastatin (ZOCOR) 20 MG tablet Take 20 mg by mouth every evening.     No current facility-administered medications for this visit.     Social History   Socioeconomic History  . Marital status: Divorced    Spouse name: Not on file  . Number of children: Not on file  . Years of education: Not on file  . Highest education level: Not on file  Occupational History  . Not on file  Social Needs  . Financial resource strain: Not on file  . Food insecurity:    Worry: Not on file    Inability: Not on file  . Transportation needs:    Medical: Not on file    Non-medical: Not on file  Tobacco Use  . Smoking status: Never Smoker  . Smokeless tobacco: Never Used  Substance and Sexual Activity  . Alcohol use: No  . Drug use: No  . Sexual activity: Never  Lifestyle  . Physical activity:    Days per week: Not on file    Minutes per session: Not on file  . Stress: Not on file  Relationships  . Social connections:    Talks on phone: Not on file    Gets together: Not on file    Attends religious service: Not on file    Active member of club or organization: Not on file    Attends meetings of clubs or organizations: Not on file    Relationship status: Not on file  . Intimate partner violence:    Fear of current or ex partner: Not on file    Emotionally abused: Not on file    Physically abused: Not on file    Forced sexual activity: Not on file  Other Topics Concern  . Not on file  Social History Narrative   Lives at home alone, works out at [employer] 6 days a week, still very active. Has an ex-husband who she is in contact with. Son died of an MI and  another son in [place].     Family History  Problem Relation Age of Onset  . Hypertension Other   . Diabetes Other   . Stroke Other   .  CAD Other       Thereasa Solo, MD 06/02/2018, 11:59 AM

## 2018-06-02 NOTE — Patient Instructions (Signed)
Dr Denman George thinks that the bleeding may have been from friction from thin vulva skin.  Dr Denman George recommends A&D ointment or vaseline applied to the skin of the vulva before walking.  Dr Fermin Schwab will see you again in the fall as planned.

## 2018-07-26 ENCOUNTER — Telehealth: Payer: Self-pay | Admitting: *Deleted

## 2018-07-26 NOTE — Telephone Encounter (Signed)
Returned the patient's call and scheduled a follow up appt for September.

## 2018-09-13 ENCOUNTER — Encounter: Payer: Self-pay | Admitting: Internal Medicine

## 2018-09-13 ENCOUNTER — Inpatient Hospital Stay (HOSPITAL_BASED_OUTPATIENT_CLINIC_OR_DEPARTMENT_OTHER): Payer: Medicare Other | Admitting: Internal Medicine

## 2018-09-13 ENCOUNTER — Other Ambulatory Visit: Payer: Self-pay

## 2018-09-13 ENCOUNTER — Encounter (HOSPITAL_COMMUNITY): Payer: Self-pay

## 2018-09-13 ENCOUNTER — Ambulatory Visit (HOSPITAL_COMMUNITY)
Admission: RE | Admit: 2018-09-13 | Discharge: 2018-09-13 | Disposition: A | Discharge status: Home / Self Care | Payer: Medicare Other | Source: Ambulatory Visit | Attending: Internal Medicine | Admitting: Internal Medicine

## 2018-09-13 ENCOUNTER — Telehealth: Payer: Self-pay | Admitting: Internal Medicine

## 2018-09-13 ENCOUNTER — Inpatient Hospital Stay: Payer: Medicare Other | Attending: Internal Medicine

## 2018-09-13 VITALS — BP 175/82 | HR 69 | Temp 98.0°F | Resp 16 | Ht 65.0 in | Wt 157.9 lb

## 2018-09-13 DIAGNOSIS — C3490 Malignant neoplasm of unspecified part of unspecified bronchus or lung: Secondary | ICD-10-CM

## 2018-09-13 DIAGNOSIS — Z79899 Other long term (current) drug therapy: Secondary | ICD-10-CM | POA: Diagnosis not present

## 2018-09-13 DIAGNOSIS — C3412 Malignant neoplasm of upper lobe, left bronchus or lung: Secondary | ICD-10-CM

## 2018-09-13 DIAGNOSIS — Z8542 Personal history of malignant neoplasm of other parts of uterus: Secondary | ICD-10-CM | POA: Diagnosis not present

## 2018-09-13 DIAGNOSIS — Z8541 Personal history of malignant neoplasm of cervix uteri: Secondary | ICD-10-CM | POA: Insufficient documentation

## 2018-09-13 DIAGNOSIS — E039 Hypothyroidism, unspecified: Secondary | ICD-10-CM | POA: Diagnosis not present

## 2018-09-13 DIAGNOSIS — Z90722 Acquired absence of ovaries, bilateral: Secondary | ICD-10-CM | POA: Diagnosis not present

## 2018-09-13 DIAGNOSIS — I10 Essential (primary) hypertension: Secondary | ICD-10-CM | POA: Insufficient documentation

## 2018-09-13 DIAGNOSIS — Z90710 Acquired absence of both cervix and uterus: Secondary | ICD-10-CM | POA: Insufficient documentation

## 2018-09-13 DIAGNOSIS — E785 Hyperlipidemia, unspecified: Secondary | ICD-10-CM | POA: Diagnosis not present

## 2018-09-13 LAB — CBC WITH DIFFERENTIAL (CANCER CENTER ONLY)
Abs Immature Granulocytes: 0.02 10*3/uL (ref 0.00–0.07)
Basophils Absolute: 0 10*3/uL (ref 0.0–0.1)
Basophils Relative: 1 %
Eosinophils Absolute: 0.1 10*3/uL (ref 0.0–0.5)
Eosinophils Relative: 2 %
HCT: 38 % (ref 36.0–46.0)
Hemoglobin: 12 g/dL (ref 12.0–15.0)
Immature Granulocytes: 1 %
Lymphocytes Relative: 24 %
Lymphs Abs: 1 10*3/uL (ref 0.7–4.0)
MCH: 21.2 pg — ABNORMAL LOW (ref 26.0–34.0)
MCHC: 31.6 g/dL (ref 30.0–36.0)
MCV: 67.3 fL — ABNORMAL LOW (ref 80.0–100.0)
Monocytes Absolute: 0.5 10*3/uL (ref 0.1–1.0)
Monocytes Relative: 11 %
Neutro Abs: 2.7 10*3/uL (ref 1.7–7.7)
Neutrophils Relative %: 61 %
Platelet Count: 206 10*3/uL (ref 150–400)
RBC: 5.65 MIL/uL — ABNORMAL HIGH (ref 3.87–5.11)
RDW: 13.8 % (ref 11.5–15.5)
WBC Count: 4.3 10*3/uL (ref 4.0–10.5)
nRBC: 0 % (ref 0.0–0.2)

## 2018-09-13 LAB — CMP (CANCER CENTER ONLY)
ALT: 27 U/L (ref 0–44)
AST: 25 U/L (ref 15–41)
Albumin: 4.2 g/dL (ref 3.5–5.0)
Alkaline Phosphatase: 93 U/L (ref 38–126)
Anion gap: 11 (ref 5–15)
BUN: 13 mg/dL (ref 8–23)
CO2: 25 mmol/L (ref 22–32)
Calcium: 9.8 mg/dL (ref 8.9–10.3)
Chloride: 96 mmol/L — ABNORMAL LOW (ref 98–111)
Creatinine: 0.78 mg/dL (ref 0.44–1.00)
GFR, Est AFR Am: >60 mL/min (ref >60)
GFR, Estimated: >60 mL/min (ref >60)
Glucose, Bld: 110 mg/dL — ABNORMAL HIGH (ref 70–99)
Potassium: 4 mmol/L (ref 3.5–5.1)
Sodium: 132 mmol/L — ABNORMAL LOW (ref 135–145)
Total Bilirubin: 0.6 mg/dL (ref 0.3–1.2)
Total Protein: 7 g/dL (ref 6.5–8.1)

## 2018-09-13 MED ORDER — IOHEXOL 300 MG/ML  SOLN
75.0000 mL | Freq: Once | INTRAMUSCULAR | Status: AC | PRN
Start: 2018-09-13 — End: 2018-09-13
  Administered 2018-09-13: 75 mL via INTRAVENOUS

## 2018-09-13 MED ORDER — SODIUM CHLORIDE (PF) 0.9 % IJ SOLN
INTRAMUSCULAR | Status: AC
  Filled 2018-09-13: qty 50

## 2018-09-13 NOTE — Telephone Encounter (Signed)
Gave avs and calendar ° °

## 2018-09-13 NOTE — Progress Notes (Signed)
[facility] [phone]   [phone]  OFFICE PROGRESS NOTE  Harlan Stains, MD [place] [id number]  DIAGNOSIS: stage IA (T1b, N0, M0) non-small cell lung cancer, adenocarcinoma presented with left upper lobe pulmonary nodule.  PRIOR THERAPY: status post lingula sparing left upper lobectomy with lymph node dissection on 08/01/2015.  CURRENT THERAPY: Observation.  INTERVAL HISTORY: [name] Richards 73 y.o. female returns to the clinic today for follow-up visit.  The patient is feeling fine today with no concerning complaints.  She denied having any chest pain, shortness of breath, cough or hemoptysis.  She has no nausea, vomiting, diarrhea or constipation.  Her blood pressure is elevated today but she mentioned that it is usually normal at home.  She was just concerned about the scan results today.  MEDICAL HISTORY: Past Medical History:  Diagnosis Date  . 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 Asthma   . Cervical cancer ([place]) 1997   poorly differentiated cervical carcinoma. S/p TAH and radiation x6 wks   . Complication of anesthesia    took 2 days to wake up after 1 surgery       pt cants use a adult cath,needs peds. cath due to past injury  . Constipation    very worried about this  . Endometrial cancer ([place]) 1997   coincidental endometrial cancer also  . Hyperlipemia   . Hypertension   . Hypothyroidism   . Primary cancer of left upper lobe of lung ([place]) 08/01/2015  . Small bowel obstruction ([place])    12/2008, repeat SBO in 02/2011 also with terminal ilietis   . Thyroid disease   . Vertebral body hemangioma    T12    ALLERGIES:  is allergic to codeine; penicillins; prochlorperazine edisylate; fentanyl; percocet [oxycodone-acetaminophen]; prochlorperazine; and penicillin g sodium.  MEDICATIONS:  Current Outpatient Medications  Medication Sig Dispense Refill  . acidophilus (RISAQUAD)  CAPS Take 1 capsule by mouth daily.    Marland Kitchen amLODipine (NORVASC) 2.5 MG tablet TK 1 T PO QD    . benazepril-hydrochlorthiazide (LOTENSIN HCT) 20-25 MG tablet benazepril 20 mg-hydrochlorothiazide 25 mg tablet    . Biotin 10 MG TABS 1 tablet    . cholecalciferol (VITAMIN D) 1000 UNITS tablet Take 1,000 Units by mouth 2 (two) times daily.     . clobetasol ointment (TEMOVATE) 0.05 % Apply topically daily as needed.     . dicyclomine (BENTYL) 20 MG tablet Take 1 tablet (20 mg total) by mouth every 6 (six) hours as needed for spasms (for abdominal cramping). 20 tablet 0  . docusate sodium (COLACE) 100 MG capsule Take 100 mg by mouth 2 (two) times daily.    . fexofenadine (ALLEGRA) 180 MG tablet Take 180 mg by mouth daily as needed for allergies or rhinitis.    Marland Kitchen levothyroxine (SYNTHROID, LEVOTHROID) 75 MCG tablet Take 75 mcg by mouth daily.    . Magnesium 300 MG CAPS 1 capsule with a meal    . Multiple Vitamin (MULTI-VITAMINS) TABS as directed    . Omega-3 Fatty Acids (FISH OIL) 1000 MG CAPS 2 (two) times daily.     . polyethylene glycol (MIRALAX / GLYCOLAX) packet Take 17 g by mouth daily. 1/2 dose once a day at bedtime.    . simvastatin (ZOCOR) 20 MG tablet Take 20 mg by mouth every evening.     No current facility-administered medications for this visit.  Facility-Administered Medications Ordered in Other Visits  Medication Dose Route Frequency Provider Last Rate Last Dose  . sodium chloride (PF) 0.9 % injection             SURGICAL HISTORY:  Past Surgical History:  Procedure Laterality Date  . ABDOMINAL HYSTERECTOMY  Feb 1997   TAH/BSO, pelvic and periaortic lymphadenectomy   . BACK SURGERY    . LYMPH NODE DISSECTION Left 08/01/2015   Procedure: LYMPH NODE DISSECTION;  Surgeon: Melrose Nakayama, MD;  Location: [facility];  Service: Thoracic;  Laterality: Left;  . PARTIAL NEPHRECTOMY     right  . SEGMENTECOMY Left 08/01/2015   Procedure: Left Upper Lobe SEGMENTECTOMY;  Surgeon: Melrose Nakayama, MD;  Location: [facility];  Service: Thoracic;  Laterality: Left;  . SKIN BIOPSY    . TUBAL LIGATION    . TUMOR REMOVAL     meningioma  T 12  . VIDEO ASSISTED THORACOSCOPY (VATS)/WEDGE RESECTION Left 08/01/2015   Procedure: VIDEO ASSISTED THORACOSCOPY (VATS)/WEDGE RESECTION;  Surgeon: Melrose Nakayama, MD;  Location: [facility];  Service: Thoracic;  Laterality: Left;    REVIEW OF SYSTEMS:  A comprehensive review of systems was negative.   PHYSICAL EXAMINATION: General appearance: alert, cooperative and no distress Head: Normocephalic, without obvious abnormality, atraumatic Neck: no adenopathy, no JVD, supple, symmetrical, trachea midline and thyroid not enlarged, symmetric, no tenderness/mass/nodules Lymph nodes: Cervical, supraclavicular, and axillary nodes normal. Resp: clear to auscultation bilaterally Back: symmetric, no curvature. ROM normal. No CVA tenderness. Cardio: regular rate and rhythm, S1, S2 normal, no murmur, click, rub or gallop GI: soft, non-tender; bowel sounds normal; no masses,  no organomegaly Extremities: extremities normal, atraumatic, no cyanosis or edema  ECOG PERFORMANCE STATUS: 0 - Asymptomatic  Blood pressure (!) 175/82, pulse 69, temperature 98 F (36.7 C), temperature source Oral, resp. rate 16, height 5\' 5"  (1.651 m), weight 157 lb 14.4 oz (71.6 kg), SpO2 100 %.  LABORATORY DATA: Lab Results  Component Value Date   WBC 4.3 09/13/2018   HGB 12.0 09/13/2018   HCT 38.0 09/13/2018   MCV 67.3 (L) 09/13/2018   PLT 206 09/13/2018      Chemistry      Component Value Date/Time   NA 132 (L) 09/13/2018 0808   NA 136 09/18/2016 1536   K 4.0 09/13/2018 0808   K 3.9 09/18/2016 1536   CL 96 (L) 09/13/2018 0808   CO2 25 09/13/2018 0808   CO2 26 09/18/2016 1536   BUN 13 09/13/2018 0808   BUN 15.8 09/18/2016 1536   CREATININE 0.78 09/13/2018 0808   CREATININE 0.8 09/18/2016 1536      Component Value Date/Time   CALCIUM 9.8 09/13/2018 0808    CALCIUM 9.3 09/18/2016 1536   ALKPHOS 93 09/13/2018 0808   ALKPHOS 91 09/18/2016 1536   AST 25 09/13/2018 0808   AST 18 09/18/2016 1536   ALT 27 09/13/2018 0808   ALT 17 09/18/2016 1536   BILITOT 0.6 09/13/2018 0808   BILITOT 0.42 09/18/2016 1536       RADIOGRAPHIC STUDIES: Ct Chest W Contrast  Result Date: 09/13/2018 CLINICAL DATA:  Patient with history of left lung cancer. Follow-up exam. EXAM: CT CHEST WITH CONTRAST TECHNIQUE: Multidetector CT imaging of the chest was performed during intravenous contrast administration. CONTRAST:  69mL OMNIPAQUE IOHEXOL 300 MG/ML  SOLN COMPARISON:  CT chest August 2019. FINDINGS: Cardiovascular: Normal heart size. Coronary arterial vascular calcifications. Thoracic aortic vascular calcifications. Trace fluid superior pericardial recess. Mediastinum/Nodes: No  enlarged axillary, mediastinal or hilar lymphadenopathy. Normal appearance of the esophagus. Lungs/Pleura: Central airways are patent. Stable left upper lobe postsurgical changes. No new or enlarging pulmonary nodules. No pleural effusion or pneumothorax. Stable 4 mm right lower lobe nodule (image 90; series 7). Stable 2-3 mm perifissural nodules with right lung. No pleural effusion or pneumothorax. Upper Abdomen: Stable atrophy superior pole right kidney. No acute process. Musculoskeletal: Thoracic spine degenerative changes. No aggressive or acute appearing osseous lesions. IMPRESSION: Stable postsurgical changes compatible with left upper lobe wedge resection. No evidence for localized recurrence or metastatic disease. Aortic Atherosclerosis (ICD10-I70.0). Electronically Signed   By: Lovey Newcomer M.D.   On: 09/13/2018 09:39    ASSESSMENT AND PLAN: This is a very pleasant 73 years old white female with history of stage IA non-small cell lung cancer status post lingular sparing left upper lobectomy with lymph node dissection in June 2017. The patient is currently on observation and she is feeling fine with  no concerning complaints. She had repeat CT scan of the chest performed recently.  I personally and independently reviewed the scans and discussed the results with the patient today. Her scan showed no concerning findings for disease recurrence or progression. I recommended for her to continue on observation with repeat CT scan of the chest in 1 year. For hypertension she will monitor her blood pressure closely at home and discuss with her primary care physician for adjustment of her medication if needed. She was advised to call immediately if she has any concerning symptoms in the interval. The patient voices understanding of current disease status and treatment options and is in agreement with the current care plan. All questions were answered. The patient knows to call the clinic with any problems, questions or concerns. We can certainly see the patient much sooner if necessary. I spent 10 minutes counseling the patient face to face. The total time spent in the appointment was 15 minutes.  Disclaimer: This note was dictated with voice recognition software. Similar sounding words can inadvertently be transcribed and may not be corrected upon review.

## 2018-09-15 ENCOUNTER — Ambulatory Visit: Payer: Medicare Other | Admitting: Internal Medicine

## 2018-10-08 ENCOUNTER — Telehealth: Payer: Self-pay | Admitting: *Deleted

## 2018-10-08 NOTE — Telephone Encounter (Signed)
Called and left the patient a message to call the office back. Need to move her 9/23 appt

## 2018-10-11 ENCOUNTER — Telehealth: Payer: Self-pay | Admitting: *Deleted

## 2018-10-11 NOTE — Telephone Encounter (Signed)
Called and left the patient a message to call the office back. Need to move her 9/23 appt

## 2018-11-01 ENCOUNTER — Other Ambulatory Visit: Payer: Self-pay

## 2018-11-01 ENCOUNTER — Inpatient Hospital Stay: Payer: Medicare Other | Attending: Internal Medicine | Admitting: Gynecologic Oncology

## 2018-11-01 VITALS — BP 149/85 | HR 73 | Temp 97.1°F | Resp 17 | Ht 65.0 in | Wt 155.6 lb

## 2018-11-01 DIAGNOSIS — Z923 Personal history of irradiation: Secondary | ICD-10-CM | POA: Diagnosis not present

## 2018-11-01 DIAGNOSIS — I10 Essential (primary) hypertension: Secondary | ICD-10-CM | POA: Insufficient documentation

## 2018-11-01 DIAGNOSIS — Z79899 Other long term (current) drug therapy: Secondary | ICD-10-CM | POA: Diagnosis not present

## 2018-11-01 DIAGNOSIS — Z8541 Personal history of malignant neoplasm of cervix uteri: Secondary | ICD-10-CM | POA: Diagnosis not present

## 2018-11-01 DIAGNOSIS — E785 Hyperlipidemia, unspecified: Secondary | ICD-10-CM | POA: Insufficient documentation

## 2018-11-01 DIAGNOSIS — Z90722 Acquired absence of ovaries, bilateral: Secondary | ICD-10-CM

## 2018-11-01 DIAGNOSIS — E039 Hypothyroidism, unspecified: Secondary | ICD-10-CM | POA: Diagnosis not present

## 2018-11-01 DIAGNOSIS — Z90710 Acquired absence of both cervix and uterus: Secondary | ICD-10-CM | POA: Diagnosis not present

## 2018-11-01 DIAGNOSIS — Z9079 Acquired absence of other genital organ(s): Secondary | ICD-10-CM | POA: Diagnosis not present

## 2018-11-01 DIAGNOSIS — Z8544 Personal history of malignant neoplasm of other female genital organs: Secondary | ICD-10-CM

## 2018-11-01 DIAGNOSIS — N904 Leukoplakia of vulva: Secondary | ICD-10-CM | POA: Diagnosis not present

## 2018-11-01 DIAGNOSIS — C519 Malignant neoplasm of vulva, unspecified: Secondary | ICD-10-CM | POA: Insufficient documentation

## 2018-11-01 DIAGNOSIS — L900 Lichen sclerosus et atrophicus: Secondary | ICD-10-CM

## 2018-11-01 NOTE — Patient Instructions (Signed)
Please contact Dr Serita Grit office if you have any concerns regarding how the tissues of your vulva feel or look.  If you feel symptoms of lichen sclerosus develop, notify her office and they will phone in clobetasol ointment for you.  Please contact Dr Serita Grit office (at [phone]) in January to request an appointment with her for March, 2021.

## 2018-11-01 NOTE — Progress Notes (Signed)
Follow-up Note: Gyn-Onc   [name] Richards 73 y.o. female  Chief Complaint  Patient presents with  . Vulvar Cancer    follow-up    Assessment : Recurrent vulvar cancer most recently having undergone a small modified radical vulvectomy of the right side on January 13, 2017.  Prior vulvar cancer status post modified posterior radical vulvectomy on 02/06/2015.    1/ Past history of Poorly differentiated endometrial and cervical cancer 1997. No evidence of disease.  Intermittent partial small bowel obstruction (no recurrences over the past 40 months now that the patient is modified her diet.).  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  Being followed by pulmonary oncology.  Plan:    Return to clinic in 6 months.   Atrophy on vulva - stable, declined estrogen topically.    . Interval History:   Patient returns today as previously scheduled. She has not had any vulvar symptoms and is not use clobetasol in a couple of years. She had some occasional bleeding/spotting but this was felt to be atrophy in early 2020. She declined estrogen use for this.    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 She underwent a total abdominal hysterectomy and bilateral salpingo-oophorectomy, pelvic and para-aortic lymphadenectomy. She received postoperative whole pelvis radiation therapy.  Her other primary gynecologic problem has been lichen sclerosus managed with when necessary clobetasol.  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  In December 2016 the patient was found to have a new primary squamous cell carcinoma the vulva undergoing a modified radical vulvectomy on 02/06/2015. She underwent a modified  radical vulvectomy at [facility] on 02/06/2015. She was found to have a 3 cm vulvar lesion of the posterior vulva extending into the posterior vagina. A rhomboid flap was required to close the incision. Patient had on pelvic postoperative course. She reports she's been doing well. She irrigates the wound twice a day with a tepid water and a blow dryer. She denies any drainage or pain or fever. She has been walking approximate half mile a day.  Final pathology showed some close surgical margins. However, the specimen margins were toward the time of surgery and it is my impression that we were widely around the primary cancer. I discussed these findings with the patient and we've agreed to monitor her closely but not recommend any radiation therapy to the vulva. The patient's agreement with this recommendation.  Recurrent vulvar cancer October 2018 managed by a small modified radical vulvectomy of the right side on January 13, 2017.  Depth of invasion was 4 mm all margins were negative.  Follow-up PET scan March 23, 2017 showed no evidence of metastatic disease.  Review of Systems:10 point review of systems is negative except as noted in interval history.   Vitals: There were no vitals taken for this visit.  Physical Exam: General : The patient is a healthy woman in no acute distress.  HEENT: normocephalic, extraoccular movements normal; neck is supple without thyromegally  Lynphnodes: Supraclavicular and inguinal nodes not enlarged  Abdomen: Soft, non-tender, no ascites, no organomegally, no masses, no hernias  Pelvic:  EGBUS: Normal female mild lichen sclerosis on the anterior vulva.  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 . There are no lesions on the vulva. Acetic acid applied -  no lesions. Speculum inserted - agglutination, no lesions.   Patient is a small cystocele.  Vagina is and foreshortened atrophic.      Allergies  Allergen Reactions  . Codeine Other (See Comments)    Other  reaction(s): amnesia Reporting amnesia  . Penicillins Hives, Rash and Other (See Comments)    Has patient had a PCN reaction causing immediate rash, facial/tongue/throat swelling, SOB or lightheadedness with hypotension: NO Has patient had a PCN reaction causing SEVERE RASH INVOLVING MUCUS MEMBRANES OR SKIN NECROSIS: YES Has patient had a PCN reaction that REQUIRED HOSPITALIZATION : patient denies hospitalization Has patient had a PCN reaction occurring within the last 10 years: NO    . Prochlorperazine Edisylate Other (See Comments)    JAWS LOCKED EXTRAPYRAMIDAL Rx  . Fentanyl Other (See Comments)    Other reaction(s): low blood pressure hypotension  . Percocet [Oxycodone-Acetaminophen] Other (See Comments)    Made pt feel real funny, never wanting to take it again; near syncopal episode  . Prochlorperazine     Other reaction(s): extrapyramidal reaction Other reaction(s): extrapyramidal reaction  . Penicillin G Sodium Rash    Other reaction(s): rash    Past Medical History:  Diagnosis Date  . 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 Asthma   . Cervical cancer ([place]) 1997   poorly differentiated cervical carcinoma. S/p TAH and radiation x6 wks   . Complication of anesthesia    took 2 days to wake up after 1 surgery       pt cants use a adult cath,needs peds. cath due to past injury  . Constipation    very worried about this  . Endometrial cancer ([place]) 1997   coincidental endometrial cancer also  . Hyperlipemia   . Hypertension   . Hypothyroidism   . Primary cancer of left upper lobe of lung ([place]) 08/01/2015  . Small bowel obstruction ([place])    12/2008, repeat SBO in 02/2011 also with terminal ilietis   . Thyroid disease   . Vertebral body hemangioma    T12    Past Surgical History:  Procedure Laterality Date  . ABDOMINAL HYSTERECTOMY  Feb 1997   TAH/BSO, pelvic and periaortic lymphadenectomy   . BACK SURGERY    . LYMPH NODE DISSECTION Left  08/01/2015   Procedure: LYMPH NODE DISSECTION;  Surgeon: Melrose Nakayama, MD;  Location: [facility];  Service: Thoracic;  Laterality: Left;  . PARTIAL NEPHRECTOMY     right  . SEGMENTECOMY Left 08/01/2015   Procedure: Left Upper Lobe SEGMENTECTOMY;  Surgeon: Melrose Nakayama, MD;  Location: [facility];  Service: Thoracic;  Laterality: Left;  . SKIN BIOPSY    . TUBAL LIGATION    . TUMOR REMOVAL     meningioma  T 12  . VIDEO ASSISTED THORACOSCOPY (VATS)/WEDGE RESECTION Left 08/01/2015   Procedure: VIDEO ASSISTED THORACOSCOPY (VATS)/WEDGE RESECTION;  Surgeon: Melrose Nakayama, MD;  Location: [facility];  Service: Thoracic;  Laterality: Left;    Current Outpatient Medications  Medication Sig Dispense Refill  . amLODipine (NORVASC) 2.5 MG tablet TK 1 T PO QD    . benazepril-hydrochlorthiazide (LOTENSIN HCT) 20-25 MG tablet benazepril 20 mg-hydrochlorothiazide 25 mg tablet    . Biotin 10 MG TABS 1 tablet    . cholecalciferol (VITAMIN D) 1000 UNITS tablet Take 1,000 Units by mouth 2 (two) times daily.     Marland Kitchen dicyclomine (BENTYL) 20 MG tablet Take 1 tablet (20 mg total) by mouth every 6 (  six) hours as needed for spasms (for abdominal cramping). 20 tablet 0  . docusate sodium (COLACE) 100 MG capsule Take 100 mg by mouth 2 (two) times daily.    . fexofenadine (ALLEGRA) 180 MG tablet Take 180 mg by mouth daily as needed for allergies or rhinitis.    Marland Kitchen levothyroxine (SYNTHROID, LEVOTHROID) 75 MCG tablet Take 75 mcg by mouth daily.    . Magnesium 300 MG CAPS 1 capsule with a meal    . Multiple Vitamin (MULTI-VITAMINS) TABS as directed    . Omega-3 Fatty Acids (FISH OIL) 1000 MG CAPS 2 (two) times daily.     . polyethylene glycol (MIRALAX / GLYCOLAX) packet Take 17 g by mouth daily. 1/2 dose once a day at bedtime.    . simvastatin (ZOCOR) 20 MG tablet Take 20 mg by mouth every evening.     No current facility-administered medications for this visit.     Social History   Socioeconomic History  .  Marital status: Divorced    Spouse name: Not on file  . Number of children: Not on file  . Years of education: Not on file  . Highest education level: Not on file  Occupational History  . Not on file  Social Needs  . Financial resource strain: Not on file  . Food insecurity    Worry: Not on file    Inability: Not on file  . Transportation needs    Medical: Not on file    Non-medical: Not on file  Tobacco Use  . Smoking status: Never Smoker  . Smokeless tobacco: Never Used  Substance and Sexual Activity  . Alcohol use: No  . Drug use: No  . Sexual activity: Never  Lifestyle  . Physical activity    Days per week: Not on file    Minutes per session: Not on file  . Stress: Not on file  Relationships  . Social Herbalist on phone: Not on file    Gets together: Not on file    Attends religious service: Not on file    Active member of club or organization: Not on file    Attends meetings of clubs or organizations: Not on file    Relationship status: Not on file  . Intimate partner violence    Fear of current or ex partner: Not on file    Emotionally abused: Not on file    Physically abused: Not on file    Forced sexual activity: Not on file  Other Topics Concern  . Not on file  Social History Narrative   Lives at home alone, works out at [employer] 6 days a week, still very active. Has an ex-husband who she is in contact with. Son died of an MI and another son in [place].     Family History  Problem Relation Age of Onset  . Hypertension Other   . Diabetes Other   . Stroke Other   . CAD Other       Thereasa Solo, MD 11/01/2018, 2:33 PM

## 2018-11-03 ENCOUNTER — Ambulatory Visit: Payer: Medicare Other | Admitting: Gynecology

## 2019-02-14 ENCOUNTER — Telehealth: Payer: Self-pay | Admitting: *Deleted

## 2019-02-14 NOTE — Telephone Encounter (Signed)
Patient was called and was scheduled to see Dr Denman [facility] for Dr Fermin Schwab

## 2019-02-17 ENCOUNTER — Inpatient Hospital Stay: Payer: Medicare Other | Attending: Gynecologic Oncology | Admitting: Gynecologic Oncology

## 2019-02-17 ENCOUNTER — Other Ambulatory Visit: Payer: Self-pay

## 2019-02-17 ENCOUNTER — Encounter: Payer: Self-pay | Admitting: Gynecologic Oncology

## 2019-02-17 VITALS — BP 185/92 | HR 59 | Temp 98.2°F | Resp 16 | Ht 65.0 in | Wt 152.6 lb

## 2019-02-17 DIAGNOSIS — Z8541 Personal history of malignant neoplasm of cervix uteri: Secondary | ICD-10-CM

## 2019-02-17 DIAGNOSIS — R195 Other fecal abnormalities: Secondary | ICD-10-CM | POA: Insufficient documentation

## 2019-02-17 DIAGNOSIS — Z923 Personal history of irradiation: Secondary | ICD-10-CM

## 2019-02-17 DIAGNOSIS — C519 Malignant neoplasm of vulva, unspecified: Secondary | ICD-10-CM | POA: Insufficient documentation

## 2019-02-17 DIAGNOSIS — C3492 Malignant neoplasm of unspecified part of left bronchus or lung: Secondary | ICD-10-CM | POA: Insufficient documentation

## 2019-02-17 DIAGNOSIS — Z8542 Personal history of malignant neoplasm of other parts of uterus: Secondary | ICD-10-CM | POA: Insufficient documentation

## 2019-02-17 NOTE — Patient Instructions (Signed)
Dr Denman [facility] is recommending that you continue to use miralax and metamucil. You can begin to try adding some fibre to your diet but back off if you experience bloating symptoms.  Please return to see Dr Denman [facility] in June, 2021 for an exam.

## 2019-02-17 NOTE — Progress Notes (Signed)
Follow-up Note: Gyn-Onc   [name] Richards 74 y.o. female  Chief Complaint  Patient presents with  . Vulvar cancer [facility])    Assessment : Recurrent vulvar cancer most recently having undergone a small modified radical vulvectomy of the right side on January 13, 2017.  Prior vulvar cancer status post modified posterior radical vulvectomy on 02/06/2015.    1/ Past history of Poorly differentiated endometrial and cervical cancer 1997. No evidence of disease.  Intermittent partial small bowel obstruction (no recurrences over the past 40 months now that the patient is modified her diet.).  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  Being followed by pulmonary oncology.  4/ narrowed stools - improved with stool softeners   Plan:    Return to clinic in 6 months.   Atrophy on vulva - stable, declined estrogen topically.    If her narrowed stools persist she should consider her seeing GI for work-up.  . Interval History:   She presents today early before her scheduled follow-up visit in February for complaints of narrowed stool caliber.  She experienced this in the first week of January 2021.  However when she discussed with her friend who is an Therapist, sports some options for this she tried Metamucil and MiraLAX and this resolved her issues.  She is now comfortable with no GI issues.   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 She underwent a total abdominal hysterectomy and bilateral salpingo-oophorectomy, pelvic and para-aortic lymphadenectomy. She received postoperative whole pelvis radiation therapy.  Her other primary gynecologic problem has been lichen sclerosus managed with when necessary clobetasol.  Beginning in 2013 the patient had intermittent episodes of partial small bowel obstruction most likely  secondary to radiation injury to the terminal ileum.  In December 2016 the patient was found to have a new primary squamous cell carcinoma the vulva undergoing a modified radical vulvectomy on 02/06/2015. She underwent a modified radical vul[facility]ctomy at [facility] on 02/06/2015. She was found to have a 3 cm vulvar lesion of the posterior vulva extending into the posterior vagina. A rhomboid flap was required to close the incision. Patient had on pelvic postoperative course. She reports she's been doing well. She irrigates the wound twice a day with a tepid water and a blow dryer. She denies any drainage or pain or fever. She has been walking approximate half mile a day.  Final pathology showed some close surgical margins. However, the specimen margins were toward the time of surgery and it is my impression that we were widely around the primary cancer. I discussed these findings with the patient and we've agreed to monitor her closely but not recommend any radiation therapy to the vulva. The patient's agreement with this recommendation.  Recurrent vulvar cancer October 2018 managed by a small modified radical vulvectomy of the right side on January 13, 2017.  Depth of invasion was 4 mm all margins were negative.  Follow-up PET scan March 23, 2017 showed no evidence of metastatic disease.  Review of Systems:10 point review of systems is negative except as noted in interval history.   Vitals: Blood pressure (!) 185/92, pulse (!) 59, temperature 98.2 F (36.8 C), temperature source Temporal, resp. rate 16, height 5\' 5"  (1.651 m), weight 152 lb 9.6 oz (69.2 kg), SpO2 100 %.  Physical Exam: General : The patient is a healthy woman in no  acute distress.  HEENT: normocephalic, extraoccular movements normal; neck is supple without thyromegally  Lynphnodes: Supraclavicular and inguinal nodes not enlarged  Abdomen: Soft, non-tender, no ascites, no organomegally, no masses, no hernias  Pelvic:  EGBUS:  Normal female mild lichen sclerosis on the anterior vulva.  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 . There are no lesions on the vulva. Acetic acid applied - no lesions. Speculum inserted - agglutination, no lesions.   Patient is a small cystocele.  Vagina is and foreshortened atrophic.  Rectal: smooth anal sphincter, no lesions or masses.     Allergies  Allergen Reactions  . Codeine Other (See Comments)    Other reaction(s): amnesia Reporting amnesia  . Penicillins Hives, Rash and Other (See Comments)    Has patient had a PCN reaction causing immediate rash, facial/tongue/throat swelling, SOB or lightheadedness with hypotension: NO Has patient had a PCN reaction causing SEVERE RASH INVOLVING MUCUS MEMBRANES OR SKIN NECROSIS: YES Has patient had a PCN reaction that REQUIRED HOSPITALIZATION : patient denies hospitalization Has patient had a PCN reaction occurring within the last 10 years: NO    . Prochlorperazine Edisylate Other (See Comments)    JAWS LOCKED EXTRAPYRAMIDAL Rx  . Fentanyl Other (See Comments)    Other reaction(s): low blood pressure hypotension  . Percocet [Oxycodone-Acetaminophen] Other (See Comments)    Made pt feel real funny, never wanting to take it again; near syncopal episode  . Prochlorperazine     Other reaction(s): extrapyramidal reaction Other reaction(s): extrapyramidal reaction  . Penicillin G Sodium Rash    Other reaction(s): rash    Past Medical History:  Diagnosis Date  . 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 Asthma   . Cervical cancer ([place]) 1997   poorly differentiated cervical carcinoma. S/p TAH and radiation x6 wks   . Complication of anesthesia    took 2 days to wake up after 1 surgery       pt cants use a adult cath,needs peds. cath due to past injury  . Constipation    very worried about this  . Endometrial cancer ([place]) 1997   coincidental endometrial cancer also  . Hyperlipemia   . Hypertension   .  Hypothyroidism   . Primary cancer of left upper lobe of lung ([place]) 08/01/2015  . Small bowel obstruction ([place])    12/2008, repeat SBO in 02/2011 also with terminal ilietis   . Thyroid disease   . Vertebral body hemangioma    T12    Past Surgical History:  Procedure Laterality Date  . ABDOMINAL HYSTERECTOMY  Feb 1997   TAH/BSO, pelvic and periaortic lymphadenectomy   . BACK SURGERY    . LYMPH NODE DISSECTION Left 08/01/2015   Procedure: LYMPH NODE DISSECTION;  Surgeon: Melrose Nakayama, MD;  Location: [facility];  Service: Thoracic;  Laterality: Left;  . PARTIAL NEPHRECTOMY     right  . SEGMENTECOMY Left 08/01/2015   Procedure: Left Upper Lobe SEGMENTECTOMY;  Surgeon: Melrose Nakayama, MD;  Location: [facility];  Service: Thoracic;  Laterality: Left;  . SKIN BIOPSY    . TUBAL LIGATION    . TUMOR REMOVAL     meningioma  T 12  . VIDEO ASSISTED THORACOSCOPY (VATS)/WEDGE RESECTION Left 08/01/2015   Procedure: VIDEO ASSISTED THORACOSCOPY (VATS)/WEDGE RESECTION;  Surgeon: Melrose Nakayama, MD;  Location: [facility];  Service: Thoracic;  Laterality: Left;    Current Outpatient Medications  Medication Sig Dispense Refill  . amLODipine (NORVASC) 2.5  MG tablet TK 1 T PO QD    . benazepril-hydrochlorthiazide (LOTENSIN HCT) 20-25 MG tablet benazepril 20 mg-hydrochlorothiazide 25 mg tablet    . Biotin 10 MG TABS 1 tablet    . cholecalciferol (VITAMIN D) 1000 UNITS tablet Take 1,000 Units by mouth 2 (two) times daily.     Marland Kitchen docusate sodium (COLACE) 100 MG capsule Take 100 mg by mouth 2 (two) times daily.    Marland Kitchen levothyroxine (SYNTHROID, LEVOTHROID) 75 MCG tablet Take 75 mcg by mouth daily.    Marland Kitchen loratadine (CLARITIN) 10 MG tablet Take 10 mg by mouth daily.    . Magnesium 300 MG CAPS 1 capsule with a meal    . Multiple Vitamin (MULTI-VITAMINS) TABS as directed    . Omega-3 Fatty Acids (FISH OIL) 1000 MG CAPS 2 (two) times daily.     . polyethylene glycol (MIRALAX / GLYCOLAX) packet Take 17 g by mouth  daily. 1/2 dose once a day at bedtime.    . psyllium (METAMUCIL) 58.6 % packet Take 1 packet by mouth daily.    . simvastatin (ZOCOR) 20 MG tablet Take 20 mg by mouth every evening.     No current facility-administered medications for this visit.    Social History   Socioeconomic History  . Marital status: Divorced    Spouse name: Not on file  . Number of children: Not on file  . Years of education: Not on file  . Highest education level: Not on file  Occupational History  . Not on file  Tobacco Use  . Smoking status: Never Smoker  . Smokeless tobacco: Never Used  Substance and Sexual Activity  . Alcohol use: No  . Drug use: No  . Sexual activity: Never  Other Topics Concern  . Not on file  Social History Narrative   Lives at home alone, works out at [employer] 6 days a week, still very active. Has an ex-husband who she is in contact with. Son died of an MI and another son in [place].    Social Determinants of Health   Financial Resource Strain:   . Difficulty of Paying Living Expenses: Not on file  Food Insecurity:   . Worried About Charity fundraiser in the Last Year: Not on file  . Ran Out of Food in the Last Year: Not on file  Transportation Needs:   . Lack of Transportation (Medical): Not on file  . Lack of Transportation (Non-Medical): Not on file  Physical Activity:   . Days of Exercise per Week: Not on file  . Minutes of Exercise per Session: Not on file  Stress:   . Feeling of Stress : Not on file  Social Connections:   . Frequency of Communication with Friends and Family: Not on file  . Frequency of Social Gatherings with Friends and Family: Not on file  . Attends Religious Services: Not on file  . Active Member of Clubs or Organizations: Not on file  . Attends Archivist Meetings: Not on file  . Marital Status: Not on file  Intimate Partner Violence:   . Fear of Current or Ex-Partner: Not on file  . Emotionally Abused: Not on file  .  Physically Abused: Not on file  . Sexually Abused: Not on file    Family History  Problem Relation Age of Onset  . Hypertension Other   . Diabetes Other   . Stroke Other   . CAD Other  Thereasa Solo, MD 02/17/2019, 5:17 PM

## 2019-03-04 ENCOUNTER — Ambulatory Visit: Payer: Medicare Other | Attending: Internal Medicine

## 2019-03-04 NOTE — Progress Notes (Signed)
    Covid-19 Vaccination Clinic  Name:  KARILYNN CARRANZA    MRN: [id number] DOB: [birth year]  03/04/2019  Ms. Cadiente was observed post Covid-19 immunization for 30 minutes based on pre-vaccination screening without incidence. She was provided with Vaccine Information Sheet and instruction to access the V-Safe system.   Ms. Vera was instructed to call 911 with any severe reactions post vaccine: Marland Kitchen Difficulty breathing  . Swelling of your face and throat  . A fast heartbeat  . A bad rash all over your body  . Dizziness and weakness

## 2019-03-08 ENCOUNTER — Ambulatory Visit: Payer: Medicare Other

## 2019-03-11 ENCOUNTER — Ambulatory Visit: Payer: Medicare Other

## 2019-03-25 ENCOUNTER — Ambulatory Visit: Payer: Medicare Other | Attending: Internal Medicine

## 2019-03-25 ENCOUNTER — Ambulatory Visit: Payer: Medicare Other

## 2019-03-25 DIAGNOSIS — Z23 Encounter for immunization: Secondary | ICD-10-CM

## 2019-03-25 NOTE — Progress Notes (Signed)
   Covid-19 Vaccination Clinic  Name:  [name] Richards    MRN: [id number] DOB: [birth year]  03/25/2019  [name] was observed post Covid-19 immunization for 15 minutes without incidence. She was provided with Vaccine Information Sheet and instruction to access the V-Safe system.   [name] was instructed to call 911 with any severe reactions post vaccine: Marland Kitchen Difficulty breathing  . Swelling of your face and throat  . A fast heartbeat  . A bad rash all over your body  . Dizziness and weakness    Immunizations Administered    Name Date Dose VIS Date Route   Pfizer COVID-19 Vaccine 03/25/2019  5:34 PM 0.3 mL 01/21/2019 Intramuscular   Manufacturer: [place]   Lot: [id number]   [place]: [id number]

## 2019-03-28 ENCOUNTER — Ambulatory Visit: Payer: Medicare Other

## 2019-04-04 ENCOUNTER — Ambulatory Visit: Payer: Medicare Other

## 2019-04-21 ENCOUNTER — Encounter: Payer: Self-pay | Admitting: Internal Medicine

## 2019-07-20 ENCOUNTER — Other Ambulatory Visit: Payer: Self-pay

## 2019-07-20 ENCOUNTER — Encounter: Payer: Self-pay | Admitting: Oncology

## 2019-07-20 ENCOUNTER — Inpatient Hospital Stay: Payer: Medicare Other | Attending: Gynecologic Oncology | Admitting: Gynecologic Oncology

## 2019-07-20 VITALS — BP 165/75 | HR 67 | Temp 98.7°F | Resp 17 | Ht 65.0 in | Wt 154.9 lb

## 2019-07-20 DIAGNOSIS — C519 Malignant neoplasm of vulva, unspecified: Secondary | ICD-10-CM | POA: Diagnosis present

## 2019-07-20 DIAGNOSIS — Z9079 Acquired absence of other genital organ(s): Secondary | ICD-10-CM | POA: Insufficient documentation

## 2019-07-20 DIAGNOSIS — Z90710 Acquired absence of both cervix and uterus: Secondary | ICD-10-CM

## 2019-07-20 DIAGNOSIS — Z90722 Acquired absence of ovaries, bilateral: Secondary | ICD-10-CM

## 2019-07-20 DIAGNOSIS — Z923 Personal history of irradiation: Secondary | ICD-10-CM | POA: Insufficient documentation

## 2019-07-20 DIAGNOSIS — N904 Leukoplakia of vulva: Secondary | ICD-10-CM | POA: Diagnosis not present

## 2019-07-20 DIAGNOSIS — Z8541 Personal history of malignant neoplasm of cervix uteri: Secondary | ICD-10-CM | POA: Diagnosis not present

## 2019-07-20 DIAGNOSIS — Z79899 Other long term (current) drug therapy: Secondary | ICD-10-CM | POA: Diagnosis not present

## 2019-07-20 DIAGNOSIS — D398 Neoplasm of uncertain behavior of other specified female genital organs: Secondary | ICD-10-CM | POA: Insufficient documentation

## 2019-07-20 DIAGNOSIS — L900 Lichen sclerosus et atrophicus: Secondary | ICD-10-CM | POA: Diagnosis not present

## 2019-07-20 DIAGNOSIS — I10 Essential (primary) hypertension: Secondary | ICD-10-CM | POA: Diagnosis not present

## 2019-07-20 DIAGNOSIS — E785 Hyperlipidemia, unspecified: Secondary | ICD-10-CM | POA: Diagnosis not present

## 2019-07-20 DIAGNOSIS — E039 Hypothyroidism, unspecified: Secondary | ICD-10-CM | POA: Insufficient documentation

## 2019-07-20 DIAGNOSIS — N898 Other specified noninflammatory disorders of vagina: Secondary | ICD-10-CM

## 2019-07-20 NOTE — Patient Instructions (Signed)
Dr Denman [facility] identified a 1 inch area of roughened skin on the undersurface of the urethra (outer part of the front wall of the vagina).  This is concerning for a possible recurrence of a cancer. However, pre-cancer (dysplasia) or warty change could also be the diagnosis. Dr Denman [facility] will phone you with the diagnosis and treatment plan once this result is available.

## 2019-07-20 NOTE — Progress Notes (Signed)
Follow-up Note: Gyn-Onc   [name] Richards 74 y.o. female  Chief Complaint  Patient presents with  . Vulvar cancer ([place])  . Lichen sclerosus    Assessment and Plan : History of recurrent vulvar cancer most recently having undergone a small modified radical vulvectomy of the right side on January 13, 2017.  Prior vulvar cancer status post modified posterior radical vulvectomy on 02/06/2015.   Concern for recurrence on current exam.  1/ Past history of Poorly differentiated endometrial and cervical cancer 1997.  Intermittent partial small bowel obstruction (no recurrences over the past 40 months now that the patient is modified her diet.).  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  Being followed by pulmonary oncology.  4/ new distal vaginal (anterior) mass. Concern for recurrence vs dysplasia. Will follow-up biopsy results. If recurrent SCC is identified, will need to determine appropriate treatment plan. She has had radiation in the past and therefore additional radiation may not be appropriate, however, surgical resection would necessitate anterior exenteration.  If dysplasia is present, she may be a candidate for a less radical, urethral sparing procedure.   . Interval History:   She presents today for routine follow-up. She reported a couple of episodes of vaginal spotting, but infrequent and light. She noted no palpable abnormalities on the vulva.   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 She underwent a total abdominal hysterectomy and bilateral salpingo-oophorectomy, pelvic and para-aortic lymphadenectomy. She received postoperative whole pelvis radiation therapy.  Her other primary gynecologic problem has been lichen sclerosus managed with when necessary clobetasol.  Beginning in 2013  the patient had intermittent episodes of partial small bowel obstruction most likely secondary to radiation injury to the terminal ileum.  In December 2016 the patient was found to have a new primary squamous cell carcinoma the vulva undergoing a modified radical vulvectomy on 02/06/2015. She underwent a modified radical vulvectomy at [facility] on 02/06/2015. She was found to have a 3 cm vulvar lesion of the posterior vulva extending into the posterior vagina. A rhomboid flap was required to close the incision. Patient had on pelvic postoperative course. She reports she's been doing well. She irrigates the wound twice a day with a tepid water and a blow dryer. She denies any drainage or pain or fever. She has been walking approximate half mile a day.  Final pathology showed some close surgical margins. However, the specimen margins were toward the time of surgery and it is my impression that we were widely around the primary cancer. I discussed these findings with the patient and we've agreed to monitor her closely but not recommend any radiation therapy to the vulva. The patient's agreement with this recommendation.  Recurrent vulvar cancer October 2018 managed by a small modified radical vulvectomy of the right side on January 13, 2017.  Depth of invasion was 4 mm all margins were negative.  Follow-up PET scan March 23, 2017 showed no evidence of metastatic disease.  Review of Systems:10 point review of systems is negative except as noted in interval history.   Vitals: Blood pressure (!) 165/75, pulse 67, temperature 98.7 F (37.1 C), temperature source Oral, resp. rate 17, height 5\' 5"  (1.651 m), weight 154 lb 14.4 oz (70.3 kg), SpO2 100 %.  Physical Exam: General : The patient is a healthy woman in no acute distress.  HEENT: normocephalic, extraoccular  movements normal; neck is supple without thyromegally  Lynphnodes: Supraclavicular and inguinal nodes not enlarged  Abdomen: Soft,  non-tender, no ascites, no organomegally, no masses, no hernias  Pelvic:  EGBUS: Normal female mild lichen sclerosis on the anterior vulva.  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 . There are no lesions on the vulva. Acetic acid applied - no lesions. Speculum inserted - agglutination, shortened vagina.  After insertion of the speculum a friable, verrucous lesion measuring approximately 2 cm was seen on the anterior wall of the suburethral vagina extending to the introitus and wrapping around to the right mid vagina.    Rectal: smooth anal sphincter, no lesions or masses.     Allergies  Allergen Reactions  . Codeine Other (See Comments)    Other reaction(s): amnesia Reporting amnesia  . Penicillins Hives, Rash and Other (See Comments)    Has patient had a PCN reaction causing immediate rash, facial/tongue/throat swelling, SOB or lightheadedness with hypotension: NO Has patient had a PCN reaction causing SEVERE RASH INVOLVING MUCUS MEMBRANES OR SKIN NECROSIS: YES Has patient had a PCN reaction that REQUIRED HOSPITALIZATION : patient denies hospitalization Has patient had a PCN reaction occurring within the last 10 years: NO    . Prochlorperazine Edisylate Other (See Comments)    JAWS LOCKED EXTRAPYRAMIDAL Rx  . Fentanyl Other (See Comments)    Other reaction(s): low blood pressure hypotension  . Percocet [Oxycodone-Acetaminophen] Other (See Comments)    Made pt feel real funny, never wanting to take it again; near syncopal episode  . Prochlorperazine     Other reaction(s): extrapyramidal reaction Other reaction(s): extrapyramidal reaction  . Penicillin G Sodium Rash    Other reaction(s): rash    Past Medical History:  Diagnosis Date  . 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 Asthma   . Cervical cancer ([place]) 1997   poorly differentiated cervical carcinoma. S/p TAH and radiation x6 wks   . Complication of anesthesia    took 2 days to wake up after 1 surgery        pt cants use a adult cath,needs peds. cath due to past injury  . Constipation    very worried about this  . Endometrial cancer ([place]) 1997   coincidental endometrial cancer also  . Hyperlipemia   . Hypertension   . Hypothyroidism   . Primary cancer of left upper lobe of lung ([place]) 08/01/2015  . Small bowel obstruction ([place])    12/2008, repeat SBO in 02/2011 also with terminal ilietis   . Thyroid disease   . Vertebral body hemangioma    T12    Past Surgical History:  Procedure Laterality Date  . ABDOMINAL HYSTERECTOMY  Feb 1997   TAH/BSO, pelvic and periaortic lymphadenectomy   . BACK SURGERY    . LYMPH NODE DISSECTION Left 08/01/2015   Procedure: LYMPH NODE DISSECTION;  Surgeon: Melrose Nakayama, MD;  Location: [place];  Service: Thoracic;  Laterality: Left;  . PARTIAL NEPHRECTOMY     right  . SEGMENTECOMY Left 08/01/2015   Procedure: Left Upper Lobe SEGMENTECTOMY;  Surgeon: Melrose Nakayama, MD;  Location: [place];  Service: Thoracic;  Laterality: Left;  . SKIN BIOPSY    . TUBAL LIGATION    . TUMOR REMOVAL     meningioma  T 12  . VIDEO ASSISTED THORACOSCOPY (VATS)/WEDGE RESECTION Left 08/01/2015   Procedure: VIDEO ASSISTED THORACOSCOPY (VATS)/WEDGE RESECTION;  Surgeon: Melrose Nakayama, MD;  Location: [place];  Service: Thoracic;  Laterality: Left;    Current Outpatient Medications  Medication Sig Dispense Refill  . amLODipine (NORVASC) 2.5 MG tablet TK 1 T PO QD    . benazepril-hydrochlorthiazide (LOTENSIN HCT) 20-25 MG tablet benazepril 20 mg-hydrochlorothiazide 25 mg tablet    . Biotin 10 MG TABS 1 tablet    . cholecalciferol (VITAMIN D) 1000 UNITS tablet Take 1,000 Units by mouth 2 (two) times daily.     Marland Kitchen docusate sodium (COLACE) 100 MG capsule Take 100 mg by mouth 2 (two) times daily.    Marland Kitchen levothyroxine (SYNTHROID, LEVOTHROID) 75 MCG tablet Take 75 mcg by mouth daily.    Marland Kitchen loratadine (CLARITIN) 10 MG tablet Take 10 mg by mouth daily.    . Magnesium 300 MG CAPS 1  capsule with a meal    . Multiple Vitamin (MULTI-VITAMINS) TABS as directed    . Omega-3 Fatty Acids (FISH OIL) 1000 MG CAPS 2 (two) times daily.     . polyethylene glycol (MIRALAX / GLYCOLAX) packet Take 17 g by mouth daily. 1/2 dose once a day at bedtime.    . psyllium (METAMUCIL) 58.6 % packet Take 1 packet by mouth daily.    . simvastatin (ZOCOR) 20 MG tablet Take 20 mg by mouth every evening.     No current facility-administered medications for this visit.    Social History   Socioeconomic History  . Marital status: Divorced    Spouse name: Not on file  . Number of children: Not on file  . Years of education: Not on file  . Highest education level: Not on file  Occupational History  . Not on file  Tobacco Use  . Smoking status: Never Smoker  . Smokeless tobacco: Never Used  Substance and Sexual Activity  . Alcohol use: No  . Drug use: No  . Sexual activity: Never  Other Topics Concern  . Not on file  Social History Narrative   Lives at home alone, works out at [employer] 6 days a week, still very active. Has an ex-husband who she is in contact with. Son died of an MI and another son in [place].    Social Determinants of Health   Financial Resource Strain:   . Difficulty of Paying Living Expenses:   Food Insecurity:   . Worried About Charity fundraiser in the Last Year:   . Arboriculturist in the Last Year:   Transportation Needs:   . Film/video editor (Medical):   Marland Kitchen Lack of Transportation (Non-Medical):   Physical Activity:   . Days of Exercise per Week:   . Minutes of Exercise per Session:   Stress:   . Feeling of Stress :   Social Connections:   . Frequency of Communication with Friends and Family:   . Frequency of Social Gatherings with Friends and Family:   . Attends Religious Services:   . Active Member of Clubs or Organizations:   . Attends Archivist Meetings:   Marland Kitchen Marital Status:   Intimate Partner Violence:   . Fear of Current or  Ex-Partner:   . Emotionally Abused:   Marland Kitchen Physically Abused:   . Sexually Abused:     Family History  Problem Relation Age of Onset  . Hypertension Other   . Diabetes Other   . Stroke Other   . CAD Other     Procedure Note:  Preop Dx: vaginal mass, history of vulvar and cervical cancer Postop Dx: same Procedure: vaginal biopsy Surgeon:  Dorann Ou, MD EBL: scant Specimens: anterior vaginal wall Complications: none Procedure Details: The patient provided verbal consent and a verbal timeout was performed.  The speculum was inserted into the vagina and the lesion was visualized on the anterior distal vaginal wall just underneath the urethra and is extending to the right vaginal sidewall.  Multiple biopsies from the same lesion were taken and placed in a single specimen cup.  There was friability from the lesion which was made hemostatic with application of Monsel solution.  Palpation of the area afterwards confirmed that this lesion was somewhat superficial without deep extension and was not fixed to the undersurface of the pubic symphysis.  It was in close approximation with the distal 3 cm of urethra.  The patient tolerated the procedure well.  The specimen sent for histopathology.   Thereasa Solo, MD 07/20/2019, 4:25 PM

## 2019-07-20 NOTE — H&P (View-Only) (Signed)
Follow-up Note: Gyn-Onc   [name] Richards 74 y.o. female  Chief Complaint  Patient presents with   Vulvar cancer ([place])   Lichen sclerosus    Assessment and Plan : History of recurrent vulvar cancer most recently having undergone a small modified radical vulvectomy of the right side on January 13, 2017.  Prior vulvar cancer status post modified posterior radical vulvectomy on 02/06/2015.   Concern for recurrence on current exam.  1/ Past history of Poorly differentiated endometrial and cervical cancer 1997.  Intermittent partial small bowel obstruction (no recurrences over the past 40 months now that the patient is modified her diet.).  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  Being followed by pulmonary oncology.  4/ new distal vaginal (anterior) mass. Concern for recurrence vs dysplasia. Will follow-up biopsy results. If recurrent SCC is identified, will need to determine appropriate treatment plan. She has had radiation in the past and therefore additional radiation may not be appropriate, however, surgical resection would necessitate anterior exenteration.  If dysplasia is present, she may be a candidate for a less radical, urethral sparing procedure.   . Interval History:   She presents today for routine follow-up. She reported a couple of episodes of vaginal spotting, but infrequent and light. She noted no palpable abnormalities on the vulva.   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 She underwent a total abdominal hysterectomy and bilateral salpingo-oophorectomy, pelvic and para-aortic lymphadenectomy. She received postoperative whole pelvis radiation therapy.  Her other primary gynecologic problem has been lichen sclerosus managed with when necessary clobetasol.  Beginning in 2013  the patient had intermittent episodes of partial small bowel obstruction most likely secondary to radiation injury to the terminal ileum.  In December 2016 the patient was found to have a new primary squamous cell carcinoma the vulva undergoing a modified radical vulvectomy on 02/06/2015. She underwent a modified radical vulvectomy at [facility] on 02/06/2015. She was found to have a 3 cm vulvar lesion of the posterior vulva extending into the posterior vagina. A rhomboid flap was required to close the incision. Patient had on pelvic postoperative course. She reports she's been doing well. She irrigates the wound twice a day with a tepid water and a blow dryer. She denies any drainage or pain or fever. She has been walking approximate half mile a day.  Final pathology showed some close surgical margins. However, the specimen margins were toward the time of surgery and it is my impression that we were widely around the primary cancer. I discussed these findings with the patient and we've agreed to monitor her closely but not recommend any radiation therapy to the vulva. The patient's agreement with this recommendation.  Recurrent vulvar cancer October 2018 managed by a small modified radical vulvectomy of the right side on January 13, 2017.  Depth of invasion was 4 mm all margins were negative.  Follow-up PET scan March 23, 2017 showed no evidence of metastatic disease.  Review of Systems:10 point review of systems is negative except as noted in interval history.   Vitals: Blood pressure (!) 165/75, pulse 67, temperature 98.7 F (37.1 C), temperature source Oral, resp. rate 17, height 5\' 5"  (1.651 m), weight 154 lb 14.4 oz (70.3 kg), SpO2 100 %.  Physical Exam: General : The patient is a healthy woman in no acute distress.  HEENT: normocephalic, extraoccular  movements normal; neck is supple without thyromegally  Lynphnodes: Supraclavicular and inguinal nodes not enlarged  Abdomen: Soft,  non-tender, no ascites, no organomegally, no masses, no hernias  Pelvic:  EGBUS: Normal female mild lichen sclerosis on the anterior vulva.  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 . There are no lesions on the vulva. Acetic acid applied - no lesions. Speculum inserted - agglutination, shortened vagina.  After insertion of the speculum a friable, verrucous lesion measuring approximately 2 cm was seen on the anterior wall of the suburethral vagina extending to the introitus and wrapping around to the right mid vagina.    Rectal: smooth anal sphincter, no lesions or masses.     Allergies  Allergen Reactions   Codeine Other (See Comments)    Other reaction(s): amnesia Reporting amnesia   Penicillins Hives, Rash and Other (See Comments)    Has patient had a PCN reaction causing immediate rash, facial/tongue/throat swelling, SOB or lightheadedness with hypotension: NO Has patient had a PCN reaction causing SEVERE RASH INVOLVING MUCUS MEMBRANES OR SKIN NECROSIS: YES Has patient had a PCN reaction that REQUIRED HOSPITALIZATION : patient denies hospitalization Has patient had a PCN reaction occurring within the last 10 years: NO     Prochlorperazine Edisylate Other (See Comments)    JAWS LOCKED EXTRAPYRAMIDAL Rx   Fentanyl Other (See Comments)    Other reaction(s): low blood pressure hypotension   Percocet [Oxycodone-Acetaminophen] Other (See Comments)    Made pt feel real funny, never wanting to take it again; near syncopal episode   Prochlorperazine     Other reaction(s): extrapyramidal reaction Other reaction(s): extrapyramidal reaction   Penicillin G Sodium Rash    Other reaction(s): rash    Past Medical History:  Diagnosis Date   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 S/p TAH and radiation x6 wks    Complication of anesthesia    took 2 days to wake up after 1 surgery        pt cants use a adult cath,needs peds. cath due to past injury   Constipation    very worried about this   Endometrial cancer [facility]) 1997   coincidental endometrial cancer also   Hyperlipemia    Hypertension    Hypothyroidism    Primary cancer of left upper lobe of lung ([place]) 08/01/2015   Small bowel obstruction ([place])    12/2008, repeat SBO in 02/2011 also with terminal ilietis    Thyroid disease    Vertebral body hemangioma    T12    Past Surgical History:  Procedure Laterality Date   ABDOMINAL HYSTERECTOMY  Feb 1997   TAH/BSO, pelvic and periaortic lymphadenectomy    BACK SURGERY     LYMPH NODE DISSECTION Left 08/01/2015   Procedure: LYMPH NODE DISSECTION;  Surgeon: Melrose Nakayama, MD;  Location: [place];  Service: Thoracic;  Laterality: Left;   PARTIAL NEPHRECTOMY     right   SEGMENTECOMY Left 08/01/2015   Procedure: Left Upper Lobe SEGMENTECTOMY;  Surgeon: Melrose Nakayama, MD;  Location: [place];  Service: Thoracic;  Laterality: Left;   SKIN BIOPSY     TUBAL LIGATION     TUMOR REMOVAL     meningioma  T 12   VIDEO ASSISTED THORACOSCOPY (VATS)/WEDGE RESECTION Left 08/01/2015   Procedure: VIDEO ASSISTED THORACOSCOPY (VATS)/WEDGE RESECTION;  Surgeon: Melrose Nakayama, MD;  Location: [place];  Service: Thoracic;  Laterality: Left;    Current Outpatient Medications  Medication Sig Dispense Refill   amLODipine (NORVASC) 2.5 MG tablet TK 1 T PO QD     benazepril-hydrochlorthiazide (LOTENSIN HCT) 20-25 MG tablet benazepril 20 mg-hydrochlorothiazide 25 mg tablet     Biotin 10 MG TABS 1 tablet     cholecalciferol (VITAMIN D) 1000 UNITS tablet Take 1,000 Units by mouth 2 (two) times daily.      docusate sodium (COLACE) 100 MG capsule Take 100 mg by mouth 2 (two) times daily.     levothyroxine (SYNTHROID, LEVOTHROID) 75 MCG tablet Take 75 mcg by mouth daily.     loratadine (CLARITIN) 10 MG tablet Take 10 mg by mouth daily.     Magnesium 300 MG CAPS 1  capsule with a meal     Multiple Vitamin (MULTI-VITAMINS) TABS as directed     Omega-3 Fatty Acids (FISH OIL) 1000 MG CAPS 2 (two) times daily.      polyethylene glycol (MIRALAX / GLYCOLAX) packet Take 17 g by mouth daily. 1/2 dose once a day at bedtime.     psyllium (METAMUCIL) 58.6 % packet Take 1 packet by mouth daily.     simvastatin (ZOCOR) 20 MG tablet Take 20 mg by mouth every evening.     No current facility-administered medications for this visit.    Social History   Socioeconomic History   Marital status: Divorced    Spouse name: Not on file   Number of children: Not on file   Years of education: Not on file   Highest education level: Not on file  Occupational History   Not on file  Tobacco Use   Smoking status: Never Smoker   Smokeless tobacco: Never Used  Substance and Sexual Activity   Alcohol use: No   Drug use: No   Sexual activity: Never  Other Topics Concern   Not on file  Social History Narrative   Lives at home alone, works out at the [employer] 6 days a week, still very active. Has an ex-husband who she is in contact with. Son died of an MI and another son in [place].    Social Determinants of Health   Financial Resource Strain:    Difficulty of Paying Living Expenses:   Food Insecurity:    Worried About Charity fundraiser in the Last Year:    Arboriculturist in the Last Year:   Transportation Needs:    Film/video editor (Medical):    Lack of Transportation (Non-Medical):   Physical Activity:    Days of Exercise per Week:    Minutes of Exercise per Session:   Stress:    Feeling of Stress :   Social Connections:    Frequency of Communication with Friends and Family:    Frequency of Social Gatherings with Friends and Family:    Attends Religious Services:    Active Member of Clubs or Organizations:    Attends Music therapist:    Marital Status:   Intimate Partner Violence:    Fear of Current or  Ex-Partner:    Emotionally Abused:    Physically Abused:    Sexually Abused:     Family History  Problem Relation Age of Onset   Hypertension Other    Diabetes Other    Stroke Other    CAD Other     Procedure Note:  Preop Dx: vaginal mass, history of vulvar and cervical cancer Postop Dx: same Procedure: vaginal biopsy Surgeon:  Dorann Ou, MD EBL: scant Specimens: anterior vaginal wall Complications: none Procedure Details: The patient provided verbal consent and a verbal timeout was performed.  The speculum was inserted into the vagina and the lesion was visualized on the anterior distal vaginal wall just underneath the urethra and is extending to the right vaginal sidewall.  Multiple biopsies from the same lesion were taken and placed in a single specimen cup.  There was friability from the lesion which was made hemostatic with application of Monsel solution.  Palpation of the area afterwards confirmed that this lesion was somewhat superficial without deep extension and was not fixed to the undersurface of the pubic symphysis.  It was in close approximation with the distal 3 cm of urethra.  The patient tolerated the procedure well.  The specimen sent for histopathology.   [name] Solo, MD 07/20/2019, 4:25 PM

## 2019-07-21 ENCOUNTER — Telehealth: Payer: Self-pay | Admitting: *Deleted

## 2019-07-21 LAB — SURGICAL PATHOLOGY

## 2019-07-21 NOTE — Telephone Encounter (Signed)
Patient called and left a message to use her cell number to call with the bx results.

## 2019-07-22 ENCOUNTER — Encounter: Payer: Self-pay | Admitting: Gynecologic Oncology

## 2019-07-22 ENCOUNTER — Telehealth: Payer: Self-pay | Admitting: Gynecologic Oncology

## 2019-07-22 NOTE — Telephone Encounter (Signed)
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 Discussed the possibility of occult invasive cancer beyond the biopsy. Therefore recommendation is for excision (rather than ablation). Recommend partial anterior distal vaginectomy with placement of foley catheter for at least 1 week postop.   Discussed that if cancer is identified on final pathology she may require additional treatments.  Discussed risks of procedure (particularly to urethra - stricture, loss of continence). Discussed the anticipated recovery and limitations to activity.  Will plan on scheduling for July 1st.  Thereasa Solo, MD

## 2019-07-25 ENCOUNTER — Other Ambulatory Visit: Payer: Self-pay | Admitting: Gynecologic Oncology

## 2019-07-25 ENCOUNTER — Telehealth: Payer: Self-pay | Admitting: Gynecologic Oncology

## 2019-07-25 DIAGNOSIS — D071 Carcinoma in situ of vulva: Secondary | ICD-10-CM

## 2019-07-25 NOTE — Telephone Encounter (Signed)
Called patient to discuss surgery dates.  Patient would like to proceed as soon as possible. July 1 selected.  Advised she would be contacted with pre-operative appointments. All questions answered.

## 2019-07-28 NOTE — Progress Notes (Addendum)
PCP - Harlan Stains, MD  Cardiologist - None   No stimulator in back   PPM/ICD -  Device Orders -  Rep Notified -   Chest x-ray -  EKG -  Stress Test -  ECHO -  Cardiac Cath -   Sleep Study -  CPAP -   Fasting Blood Sugar -  Checks Blood Sugar _____ times a day  Blood Thinner Instructions: Aspirin Instructions:  ERAS Protcol - PRE-SURGERY Ensure or G2-   COVID TEST-   COVID Vaccination x 2 ( Pfizer)  Pt works out 5-7 days per week   Anesthesia review:   Patient denies shortness of breath, fever, cough and chest pain at PAT appointment   All instructions explained to the patient, with a verbal understanding of the material. Patient agrees to go over the instructions while at home for a better understanding. Patient also instructed to self quarantine after being tested for COVID-19. The opportunity to ask questions was provided.

## 2019-07-28 NOTE — Patient Instructions (Addendum)
[name] Richards  07/28/2019   Your procedure is scheduled on: 08-11-19   Report to [facility] Main  Entrance    Report to Admitting at 5:30 AM    Call this number if you have problems the morning of surgery [phone]    Remember: AFTER MIDNIGHT THE NIGHT PRIOR TO SURGERY. NOTHING BY MOUTH EXCEPT CLEAR LIQUIDS UNTIL 4:30 AM . AFTER 4:30 AM, NOTHING UNTIL AFTER SURGERY.     CLEAR LIQUID DIET   Foods Allowed                                                                     Foods Excluded  Coffee and tea, regular and decaf                             liquids that you cannot  Plain Jell-O any favor except red or purple                                           see through such as: Fruit ices (not with fruit pulp)                                     milk, soups, orange juice  Iced Popsicles                                    All solid food Carbonated beverages, regular and diet                                    Cranberry, grape and apple juices Sports drinks like Gatorade Lightly seasoned clear broth or consume(fat free) Sugar, honey syrup   _____________________________________________________________________    Take these medicines the morning of surgery with A SIP OF WATER: Levothyroxine (Synthroid                               You may not have any metal on your body including hair pins and              piercings    Do not wear jewelry, make-up, lotions, powders or perfumes, deodorant              Do not wear nail polish on your fingernails.  Do not shave  48 hours prior to surgery.     Do not bring valuables to the hospital. [place].  Contacts, dentures or bridgework may not be worn into surgery.    Patients discharged the day of surgery will not be allowed to drive home. IF  YOU ARE HAVING SURGERY AND GOING HOME THE SAME DAY, YOU MUST HAVE AN ADULT TO DRIVE YOU HOME AND BE WITH  YOU FOR 24 HOURS. YOU MAY GO HOME BY TAXI OR UBER OR ORTHERWISE, BUT AN ADULT MUST ACCOMPANY YOU HOME AND STAY WITH YOU FOR 24 HOURS.  Name and phone number of your driver:[name] Richards [phone]   Special Instructions: N/A              Please read over the following fact sheets you were given: _____________________________________________________________________             [facility] - Preparing for Surgery Before surgery, you can play an important role.  Because skin is not sterile, your skin needs to be as free of germs as possible.  You can reduce the number of germs on your skin by washing with CHG (chlorahexidine gluconate) soap before surgery.  CHG is an antiseptic cleaner which kills germs and bonds with the skin to continue killing germs even after washing. Please DO NOT use if you have an allergy to CHG or antibacterial soaps.  If your skin becomes reddened/irritated stop using the CHG and inform your nurse when you arrive at Short Stay. Do not shave (including legs and underarms) for at least 48 hours prior to the first CHG shower.  You may shave your face/neck. Please follow these instructions carefully:  1.  Shower with CHG Soap the night before surgery and the  morning of Surgery.  2.  If you choose to wash your hair, wash your hair first as usual with your  normal  shampoo.  3.  After you shampoo, rinse your hair and body thoroughly to remove the  shampoo.                           4.  Use CHG as you would any other liquid soap.  You can apply chg directly  to the skin and wash                       Gently with a scrungie or clean washcloth.  5.  Apply the CHG Soap to your body ONLY FROM THE NECK DOWN.   Do not use on face/ open                           Wound or open sores. Avoid contact with eyes, ears mouth and genitals (private parts).                       Wash face,  Genitals (private parts) with your normal soap.             6.  Wash thoroughly, paying special  attention to the area where your surgery  will be performed.  7.  Thoroughly rinse your body with warm water from the neck down.  8.  DO NOT shower/wash with your normal soap after using and rinsing off  the CHG Soap.                9.  Pat yourself dry with a clean towel.            10.  Wear clean pajamas.            11.  Place clean sheets on your bed the night of your first shower and do not  sleep with  pets. Day of Surgery : Do not apply any lotions/deodorants the morning of surgery.  Please wear clean clothes to the hospital/surgery center.  FAILURE TO FOLLOW THESE INSTRUCTIONS MAY RESULT IN THE CANCELLATION OF YOUR SURGERY PATIENT SIGNATURE_________________________________  NURSE SIGNATURE__________________________________  ________________________________________________________________________   [name] Richards  An incentive spirometer is a tool that can help keep your lungs clear and active. This tool measures how well you are filling your lungs with each breath. Taking long deep breaths may help reverse or decrease the chance of developing breathing (pulmonary) problems (especially infection) following:  A long period of time when you are unable to move or be active. BEFORE THE PROCEDURE   If the spirometer includes an indicator to show your best effort, your nurse or respiratory therapist will set it to a desired goal.  If possible, sit up straight or lean slightly forward. Try not to slouch.  Hold the incentive spirometer in an upright position. INSTRUCTIONS FOR USE  1. Sit on the edge of your bed if possible, or sit up as far as you can in bed or on a chair. 2. Hold the incentive spirometer in an upright position. 3. Breathe out normally. 4. Place the mouthpiece in your mouth and seal your lips tightly around it. 5. Breathe in slowly and as deeply as possible, raising the piston or the ball toward the top of the column. 6. Hold your breath for 3-5 seconds or for  as long as possible. Allow the piston or ball to fall to the bottom of the column. 7. Remove the mouthpiece from your mouth and breathe out normally. 8. Rest for a few seconds and repeat Steps 1 through 7 at least 10 times every 1-2 hours when you are awake. Take your time and take a few normal breaths between deep breaths. 9. The spirometer may include an indicator to show your best effort. Use the indicator as a goal to work toward during each repetition. 10. After each set of 10 deep breaths, practice coughing to be sure your lungs are clear. If you have an incision (the cut made at the time of surgery), support your incision when coughing by placing a pillow or rolled up towels firmly against it. Once you are able to get out of bed, walk around indoors and cough well. You may stop using the incentive spirometer when instructed by your caregiver.  RISKS AND COMPLICATIONS  Take your time so you do not get dizzy or light-headed.  If you are in pain, you may need to take or ask for pain medication before doing incentive spirometry. It is harder to take a deep breath if you are having pain. AFTER USE  Rest and breathe slowly and easily.  It can be helpful to keep track of a log of your progress. Your caregiver can provide you with a simple table to help with this. If you are using the spirometer at home, follow these instructions: [place] IF:   You are having difficultly using the spirometer.  You have trouble using the spirometer as often as instructed.  Your pain medication is not giving enough relief while using the spirometer.  You develop fever of 100.5 F (38.1 C) or higher. SEEK IMMEDIATE MEDICAL CARE IF:   You cough up bloody sputum that had not been present before.  You develop fever of 102 F (38.9 C) or greater.  You develop worsening pain at or near the incision site. MAKE SURE YOU:   Understand these instructions.  Will watch your condition.  Will get  help right away if you are not doing well or get worse. Document Released: 06/09/2006 Document Revised: 04/21/2011 Document Reviewed: 08/10/2006 ExitCare Patient Information 2014 Exit[employer], [place].   ________________________________________________________________________  WHAT IS A BLOOD TRANSFUSION? Blood Transfusion Information  A transfusion is the replacement of blood or some of its parts. Blood is made up of multiple cells which provide different functions.  Red blood cells carry oxygen and are used for blood loss replacement.  White blood cells fight against infection.  Platelets control bleeding.  Plasma helps clot blood.  Other blood products are available for specialized needs, such as hemophilia or other clotting disorders. BEFORE THE TRANSFUSION  Who gives blood for transfusions?   Healthy volunteers who are fully evaluated to make sure their blood is safe. This is blood bank blood. Transfusion therapy is the safest it has ever been in the practice of medicine. Before blood is taken from a donor, a complete history is taken to make sure that person has no history of diseases nor engages in risky social behavior (examples are intravenous drug use or sexual activity with multiple partners). The donor's travel history is screened to minimize risk of transmitting infections, such as malaria. The donated blood is tested for signs of infectious diseases, such as HIV and hepatitis. The blood is then tested to be sure it is compatible with you in order to minimize the chance of a transfusion reaction. If you or a relative donates blood, this is often done in anticipation of surgery and is not appropriate for emergency situations. It takes many days to process the donated blood. RISKS AND COMPLICATIONS Although transfusion therapy is very safe and saves many lives, the main dangers of transfusion include:   Getting an infectious disease.  Developing a transfusion reaction. This is an  allergic reaction to something in the blood you were given. Every precaution is taken to prevent this. The decision to have a blood transfusion has been considered carefully by your caregiver before blood is given. Blood is not given unless the benefits outweigh the risks. AFTER THE TRANSFUSION  Right after receiving a blood transfusion, you will usually feel much better and more energetic. This is especially true if your red blood cells have gotten low (anemic). The transfusion raises the level of the red blood cells which carry oxygen, and this usually causes an energy increase.  The nurse administering the transfusion will monitor you carefully for complications. HOME CARE INSTRUCTIONS  No special instructions are needed after a transfusion. You may find your energy is better. Speak with your caregiver about any limitations on activity for underlying diseases you may have. SEEK MEDICAL CARE IF:   Your condition is not improving after your transfusion.  You develop redness or irritation at the intravenous (IV) site. SEEK IMMEDIATE MEDICAL CARE IF:  Any of the following symptoms occur over the next 12 hours:  Shaking chills.  You have a temperature by mouth above 102 F (38.9 C), not controlled by medicine.  Chest, back, or muscle pain.  People around you feel you are not acting correctly or are confused.  Shortness of breath or difficulty breathing.  Dizziness and fainting.  You get a rash or develop hives.  You have a decrease in urine output.  Your urine turns a dark color or changes to pink, red, or brown. Any of the following symptoms occur over the next 10 days:  You have a temperature by mouth above 102  F (38.9 C), not controlled by medicine.  Shortness of breath.  Weakness after normal activity.  The white part of the eye turns yellow (jaundice).  You have a decrease in the amount of urine or are urinating less often.  Your urine turns a dark color or changes  to pink, red, or brown. Document Released: 01/25/2000 Document Revised: 04/21/2011 Document Reviewed: 09/13/2007 [facility] Patient Information 2014 [place], [place].  _______________________________________________________________________

## 2019-07-31 ENCOUNTER — Encounter: Payer: Self-pay | Admitting: Gynecologic Oncology

## 2019-08-01 ENCOUNTER — Encounter: Payer: Self-pay | Admitting: Gynecologic Oncology

## 2019-08-02 ENCOUNTER — Encounter (HOSPITAL_COMMUNITY)
Admission: RE | Admit: 2019-08-02 | Discharge: 2019-08-02 | Disposition: A | Discharge status: Home / Self Care | Payer: Medicare Other | Source: Ambulatory Visit | Attending: Gynecologic Oncology | Admitting: Gynecologic Oncology

## 2019-08-02 ENCOUNTER — Telehealth: Payer: Self-pay | Admitting: Oncology

## 2019-08-02 ENCOUNTER — Other Ambulatory Visit: Payer: Self-pay

## 2019-08-02 ENCOUNTER — Encounter (HOSPITAL_COMMUNITY): Payer: Self-pay

## 2019-08-02 NOTE — Telephone Encounter (Signed)
Left a message for [name] Richards regarding her [employer].  Requested a return call.

## 2019-08-04 ENCOUNTER — Encounter (HOSPITAL_COMMUNITY)
Admission: RE | Admit: 2019-08-04 | Discharge: 2019-08-04 | Disposition: A | Discharge status: Home / Self Care | Payer: Medicare Other | Source: Ambulatory Visit | Attending: Gynecologic Oncology | Admitting: Gynecologic Oncology

## 2019-08-04 ENCOUNTER — Other Ambulatory Visit: Payer: Self-pay

## 2019-08-04 DIAGNOSIS — Z01818 Encounter for other preprocedural examination: Secondary | ICD-10-CM | POA: Insufficient documentation

## 2019-08-04 DIAGNOSIS — R001 Bradycardia, unspecified: Secondary | ICD-10-CM | POA: Diagnosis not present

## 2019-08-04 DIAGNOSIS — R9431 Abnormal electrocardiogram [ECG] [EKG]: Secondary | ICD-10-CM | POA: Insufficient documentation

## 2019-08-04 DIAGNOSIS — I10 Essential (primary) hypertension: Secondary | ICD-10-CM | POA: Diagnosis not present

## 2019-08-04 LAB — COMPREHENSIVE METABOLIC PANEL
ALT: 21 U/L (ref 0–44)
AST: 21 U/L (ref 15–41)
Albumin: 4 g/dL (ref 3.5–5.0)
Alkaline Phosphatase: 76 U/L (ref 38–126)
Anion gap: 9 (ref 5–15)
BUN: 15 mg/dL (ref 8–23)
CO2: 28 mmol/L (ref 22–32)
Calcium: 9 mg/dL (ref 8.9–10.3)
Chloride: 95 mmol/L — ABNORMAL LOW (ref 98–111)
Creatinine, Ser: 0.67 mg/dL (ref 0.44–1.00)
GFR calc Af Amer: >60 mL/min (ref >60)
GFR calc non Af Amer: >60 mL/min (ref >60)
Glucose, Bld: 112 mg/dL — ABNORMAL HIGH (ref 70–99)
Potassium: 3.6 mmol/L (ref 3.5–5.1)
Sodium: 132 mmol/L — ABNORMAL LOW (ref 135–145)
Total Bilirubin: 0.7 mg/dL (ref 0.3–1.2)
Total Protein: 6.8 g/dL (ref 6.5–8.1)

## 2019-08-04 LAB — URINALYSIS, ROUTINE W REFLEX MICROSCOPIC
Bilirubin Urine: NEGATIVE
Glucose, UA: NEGATIVE mg/dL
Hgb urine dipstick: NEGATIVE
Ketones, ur: NEGATIVE mg/dL
Nitrite: NEGATIVE
Protein, ur: NEGATIVE mg/dL
Specific Gravity, Urine: 1.012 (ref 1.005–1.030)
pH: 7 (ref 5.0–8.0)

## 2019-08-04 LAB — CBC
HCT: 40.1 % (ref 36.0–46.0)
Hemoglobin: 12.6 g/dL (ref 12.0–15.0)
MCH: 21.8 pg — ABNORMAL LOW (ref 26.0–34.0)
MCHC: 31.4 g/dL (ref 30.0–36.0)
MCV: 69.5 fL — ABNORMAL LOW (ref 80.0–100.0)
Platelets: 227 10*3/uL (ref 150–400)
RBC: 5.77 MIL/uL — ABNORMAL HIGH (ref 3.87–5.11)
RDW: 14.5 % (ref 11.5–15.5)
WBC: 4.7 10*3/uL (ref 4.0–10.5)
nRBC: 0 % (ref 0.0–0.2)

## 2019-08-04 NOTE — Progress Notes (Signed)
UA result routed to Dr. Harrington Challenger for review.

## 2019-08-05 ENCOUNTER — Telehealth: Payer: Self-pay | Admitting: *Deleted

## 2019-08-05 NOTE — Telephone Encounter (Signed)
Called and scheduled the patient for a pre op appt with Melissa APP on Monday

## 2019-08-08 ENCOUNTER — Other Ambulatory Visit: Payer: Self-pay

## 2019-08-08 ENCOUNTER — Other Ambulatory Visit (HOSPITAL_COMMUNITY)
Admission: RE | Admit: 2019-08-08 | Discharge: 2019-08-08 | Disposition: A | Discharge status: Home / Self Care | Payer: Medicare Other | Source: Ambulatory Visit | Attending: Gynecologic Oncology | Admitting: Gynecologic Oncology

## 2019-08-08 ENCOUNTER — Telehealth: Payer: Self-pay | Admitting: Gynecologic Oncology

## 2019-08-08 ENCOUNTER — Inpatient Hospital Stay (HOSPITAL_BASED_OUTPATIENT_CLINIC_OR_DEPARTMENT_OTHER): Payer: Medicare Other | Admitting: Gynecologic Oncology

## 2019-08-08 VITALS — BP 165/62 | HR 65 | Temp 98.5°F | Resp 16 | Ht 65.0 in | Wt 154.0 lb

## 2019-08-08 DIAGNOSIS — Z20822 Contact with and (suspected) exposure to covid-19: Secondary | ICD-10-CM | POA: Diagnosis not present

## 2019-08-08 DIAGNOSIS — Z01812 Encounter for preprocedural laboratory examination: Secondary | ICD-10-CM | POA: Diagnosis present

## 2019-08-08 DIAGNOSIS — D072 Carcinoma in situ of vagina: Secondary | ICD-10-CM

## 2019-08-08 DIAGNOSIS — D071 Carcinoma in situ of vulva: Secondary | ICD-10-CM

## 2019-08-08 LAB — SARS CORONAVIRUS 2 (TAT 6-24 HRS): SARS Coronavirus 2: NEGATIVE

## 2019-08-08 MED ORDER — TRAMADOL HCL 50 MG PO TABS: 50.0000 mg | ORAL_TABLET | Freq: Four times a day (QID) | ORAL | 0 refills | Status: DC | PRN

## 2019-08-08 NOTE — Telephone Encounter (Signed)
Called patient to inform her that she received tramadol after her last surgery at [facility]. A new script will be sent to her pharmacy to be used post-operatively. Also advised that the results of her previous EKGs at [facility] were reviewed with her recent EKG here.  Dr. Denman George also made aware of the new finding of t wave abnormality with no intervention needed. No concerns voiced. Advised to call for any needs.

## 2019-08-08 NOTE — Progress Notes (Signed)
Patient here for a pre-operative appointment prior to her scheduled surgery on August 11, 2019. She is scheduled for a distal partial vaginectomy with Dr. Everitt Amber.  She had her pre-admission testing appointment over the phone with Dolores Lory RN with [facility].  The surgery was discussed in detail.  See after visit summary for additional details.    Discussed post-op pain management in detail including the aspects of the enhanced recovery pathway. Given her allergies/intolerances to most narcotics, we will review her [facility] record to see what pain medication was prescribed that she tolerated well after her previous surgery with Dr. Fermin Schwab. We discussed the use of tylenol post-op and to monitor for a maximum of 4,000 mg in a 24 hour period along with use of ibuprofen or naproxen.  She currently takes two stool softeners a day and uses Miralax as well.  Discussed increasing the Miralax to twice daily if needed and to continue stool softeners.  Foley catheter care discussed along with visual aid of a foley bag and leg bag for teaching.  Sitz bath discussed but patient would like to hold on this for now and use her handheld shower. She has peri-pad ice packs for post-op use at home. Pre-operative labs discussed.  No urinary symptoms reported.  Discussed hydration after surgery including use of fluids such as G2 gatorade and tea given her mild hyponatremia. Glucose slightly elevated fasting but recent A1C was 5.8 per pt.      Discussed the use of measures for DVT prophylaxis.  Reportable signs and symptoms of DVT discussed. Post-operative instructions discussed and expectations for after surgery. Incisional care discussed as well including reportable signs and symptoms including erythema, drainage, wound separation.     30 minutes spent with the patient.  Verbalizing understanding of material discussed. No needs or concerns voiced at the end of the visit.   Advised patient to call for any needs.  Advised that  her post-operative medications would be prescribed once her [facility] records had been reviewed.  EKG also reviewed and will see if more recent EKG is available in [facility] system for comparision.

## 2019-08-08 NOTE — Patient Instructions (Addendum)
Preparing for your Surgery  Plan for surgery on August 11, 2019 with Dr. Everitt Amber at [place] will be scheduled for a partial distal vaginectomy.  You will have a foley catheter in for one week then we will schedule you for removal.   Pre-operative Testing -(Done) You will receive a phone call from presurgical testing at [facility] to arrange for a pre-operative appointment over the phone, lab appointment, and COVID test. The COVID test normally happens 3 days prior to the surgery and they ask that you self quarantine after the test up until surgery to decrease chance of exposure.  -Bring your insurance card, copy of an advanced directive if applicable, medication list  -At that visit, you will be asked to sign a consent for a possible blood transfusion in case a transfusion becomes necessary during surgery.  The need for a blood transfusion is rare but having consent is a necessary part of your care.     -You should not be taking blood thinners or aspirin at least ten days prior to surgery unless instructed by your surgeon.  -Do not take supplements such as fish oil (omega 3), red yeast rice, turmeric before your surgery. STOP FISH OIL NOW.  Day Before Surgery at [place] will be advised you can have clear liquids after midnight and up until 3 hours before your surgery.    Your role in recovery Your role is to become active as soon as directed by your doctor, while still giving yourself time to heal.  Rest when you feel tired. You will be asked to do the following in order to speed your recovery:  - Cough and breathe deeply. This helps to clear and expand your lungs and can prevent pneumonia after surgery.  - [place]. Do mild physical activity. Walking or moving your legs help your circulation and body functions return to normal. Do not try to get up or walk alone the first time after surgery.   -If you develop swelling on one leg or the  other, pain in the back of your leg, redness/warmth in one of your legs, please call the office or go to the Emergency Room to have a doppler to rule out a blood clot. For shortness of breath, chest pain-seek care in the Emergency Room as soon as possible. - Actively manage your pain. Managing your pain lets you move in comfort. We will ask you to rate your pain on a scale of zero to 10. It is your responsibility to tell your doctor or nurse where and how much you hurt so your pain can be treated.  Special Considerations  -Your final pathology results from surgery should be available around one week after surgery and the results will be relayed to you when available.  -Dr. Lahoma Crocker is the surgeon that assists your GYN Oncologist with surgery.  If you end up staying the night, the next day after your surgery you will either see Dr. Denman George, Dr. Berline Lopes, or Dr. Lahoma Crocker.  -FMLA forms can be faxed to [phone] and please allow 5-7 business days for completion.  Pain Management After Surgery -You will be prescribed your pain medication and bowel regimen medications before surgery so that you can have these available when you are discharged from the hospital. The pain medication is for use ONLY AFTER surgery and a new prescription will not be given.   -Make sure that you have Tylenol and Ibuprofen at  home to use on a regular basis after surgery for pain control. We recommend alternating the medications every hour to six hours since they work differently and are processed in the body differently for pain relief.  -Review the attached handout on narcotic use and their risks and side effects.   Bowel Regimen -You will be prescribed Sennakot-S to take nightly to prevent constipation especially if you are taking the narcotic pain medication intermittently.  It is important to prevent constipation and drink adequate amounts of liquids. You can stop taking this medication when you are not  taking pain medication and you are back on your normal bowel routine.  Risks of Surgery Risks of surgery are low but include bleeding, infection, damage to surrounding structures, re-operation, blood clots, and very rarely death.   Blood Transfusion Information (For the consent to be signed before surgery)  We will be checking your blood type before surgery so in case of emergencies, we will know what type of blood you would need.                                            WHAT IS A BLOOD TRANSFUSION?  A transfusion is the replacement of blood or some of its parts. Blood is made up of multiple cells which provide different functions.  Red blood cells carry oxygen and are used for blood loss replacement.  White blood cells fight against infection.  Platelets control bleeding.  Plasma helps clot blood.  Other blood products are available for specialized needs, such as hemophilia or other clotting disorders. BEFORE THE TRANSFUSION  Who gives blood for transfusions?   You may be able to donate blood to be used at a later date on yourself (autologous donation).  Relatives can be asked to donate blood. This is generally not any safer than if you have received blood from a stranger. The same precautions are taken to ensure safety when a relative's blood is donated.  Healthy volunteers who are fully evaluated to make sure their blood is safe. This is blood bank blood. Transfusion therapy is the safest it has ever been in the practice of medicine. Before blood is taken from a donor, a complete history is taken to make sure that person has no history of diseases nor engages in risky social behavior (examples are intravenous drug use or sexual activity with multiple partners). The donor's travel history is screened to minimize risk of transmitting infections, such as malaria. The donated blood is tested for signs of infectious diseases, such as HIV and hepatitis. The blood is then tested to be  sure it is compatible with you in order to minimize the chance of a transfusion reaction. If you or a relative donates blood, this is often done in anticipation of surgery and is not appropriate for emergency situations. It takes many days to process the donated blood. RISKS AND COMPLICATIONS Although transfusion therapy is very safe and saves many lives, the main dangers of transfusion include:   Getting an infectious disease.  Developing a transfusion reaction. This is an allergic reaction to something in the blood you were given. Every precaution is taken to prevent this. The decision to have a blood transfusion has been considered carefully by your caregiver before blood is given. Blood is not given unless the benefits outweigh the risks.  AFTER SURGERY INSTRUCTIONS  Return to work: 4-6  weeks if applicable  You can use frozen peas in a ziploc bag as an ice pack to the vulva.  You will need to use the peri-bottle after toileting. You can start using a sitz bath after one week from surgery (2-3 times a day).  Activity: 1. Be up and out of the bed during the day.  Take a nap if needed.  You may walk up steps but be careful and use the hand rail.  Stair climbing will tire you more than you think, you may need to stop part way and rest.   2. No long walks or exercise for one month. No lifting or straining for 4 weeks over 10 pounds. No pushing, pulling, straining for 4 weeks.  3. No driving for 1 week(s).  Do not drive if you are taking narcotic pain medicine and make sure that your reaction time has returned.   4. You can shower as soon as the next day after surgery. Shower daily.  Use soap and water on your incision and pat dry; don't rub.  No tub baths or submerging your body in water for one week minimum. If you have the soap that was given to you by pre-surgical testing that was used before surgery, you do not need to use it afterwards because this can irritate your incisions.   5. No  sexual activity and nothing in the vagina for 4-6 weeks.  6. You may experience a small amount of clear or pink drainage from your incision, which is normal.  If the drainage persists, increases, or changes color please call the office.  7. Take Tylenol or ibuprofen first for pain and only use narcotic pain medication for severe pain not relieved by the Tylenol or Ibuprofen.  Monitor your Tylenol intake to a max of 4,000 mg in a 24 hour period. You can alternate these medications after surgery.  Diet: 1. Low sodium Heart Healthy Diet is recommended.  2. It is safe to use a laxative, such as Miralax or Colace, if you have difficulty moving your bowels. You have been prescribed Sennakot at bedtime every evening to keep bowel movements regular and to prevent constipation.    Wound Care: 1. Keep clean and dry.  Shower daily.  Reasons to call the Doctor:  Fever - Oral temperature greater than 100.4 degrees Fahrenheit  Foul-smelling vaginal discharge  Difficulty urinating  Nausea and vomiting  Increased pain at the site of the incision that is unrelieved with pain medicine.  Difficulty breathing with or without chest pain  New calf pain especially if only on one side  Sudden, continuing increased vaginal bleeding with or without clots.   Contacts: For questions or concerns you should contact:  Dr. Everitt Amber at [place] at [phone]  After Hours: call [phone] and have the GYN Oncologist paged/contacted   Indwelling Urinary Catheter Care, Adult  An indwelling urinary catheter is a thin, flexible, germ-free (sterile) tube that is placed into the bladder to help drain urine out of the body. The catheter is inserted into the part of the body that drains urine from the bladder (urethra). Urine drains from the catheter into a drainage bag outside of the body. Taking good care of your catheter will keep it working properly and help to prevent problems  from developing. What are the risks?  Bacteria may get into your bladder and cause a urinary tract infection.  Urine flow can become blocked. This can happen if the catheter is  not working correctly, or if you have sediment or a blood clot in your bladder or the catheter.  Tissue near the catheter may become irritated and bleed. How to wear your catheter and your drainage bag Supplies needed  Adhesive tape or a leg strap.  Alcohol wipe or soap and water (if you use tape).  A clean towel (if you use tape).  Overnight drainage bag.  Smaller drainage bag (leg bag). Wearing your catheter and bag Use adhesive tape or a leg strap to attach your catheter to your leg.  Make sure the catheter is not pulled tight.  If a leg strap gets wet, replace it with a dry one.  If you use adhesive tape: 1. Use an alcohol wipe or soap and water to wash off any stickiness on your skin where you had tape before. 2. Use a clean towel to pat-dry the area. 3. Apply the new tape. You should have received a large overnight drainage bag and a smaller leg bag that fits underneath clothing.  You may wear the overnight bag at any time, but you should not wear the leg bag at night.  Always wear the leg bag below your knee.  Make sure the overnight drainage bag is always lower than the level of your bladder, but do not let it touch the floor. Before you go to sleep, hang the bag inside a wastebasket that is covered by a clean plastic bag. How to care for your skin around the catheter     Supplies needed  A clean washcloth.  Water and mild soap.  A clean towel. Caring for your skin and catheter  Every day, use a clean washcloth and soapy water to clean the skin around your catheter. 1. Wash your hands with soap and water. 2. Wet a washcloth in warm water and mild soap. 3. Clean the skin around your urethra.  If you are female:  Use one hand to gently spread the folds of skin around your vagina  (labia).  With the washcloth in your other hand, wipe the inner side of your labia on each side. Do this in a front-to-back direction.  If you are female:  Use one hand to pull back any skin that covers the end of your penis (foreskin).  With the washcloth in your other hand, wipe your penis in small circles. Start wiping at the tip of your penis, then move outward from the catheter.  Move the foreskin back in place, if this applies. 4. With your free hand, hold the catheter close to where it enters your body. Keep holding the catheter during cleaning so it does not get pulled out. 5. Use your other hand to clean the catheter with the washcloth.  Only wipe downward on the catheter.  Do not wipe upward toward your body, because that may push bacteria into your urethra and cause infection. 6. Use a clean towel to pat-dry the catheter and the skin around it. Make sure to wipe off all soap. 7. Wash your hands with soap and water.  Shower every day. Do not take baths.  Do not use cream, ointment, or lotion on the area where the catheter enters your body, unless your health care provider tells you to do that.  Do not use powders, sprays, or lotions on your genital area.  Check your skin around the catheter every day for signs of infection. Check for: ? Redness, swelling, or pain. ? Fluid or blood. ? Warmth. ? Pus or  a bad smell. How to empty the drainage bag Supplies needed  Rubbing alcohol.  Gauze pad or cotton ball.  Adhesive tape or a leg strap. Emptying the bag Empty your drainage bag (your overnight drainage bag or your leg bag) when it is ?- full, or at least 2-3 times a day. Clean the drainage bag according to the manufacturer's instructions or as told by your health care provider. 1. Wash your hands with soap and water. 2. Detach the drainage bag from your leg. 3. Hold the drainage bag over the toilet or a clean container. Make sure the drainage bag is lower than your hips  and bladder. This stops urine from going back into the tubing and into your bladder. 4. Open the pour spout at the bottom of the bag. 5. Empty the urine into the toilet or container. Do not let the pour spout touch any surface. This precaution is important to prevent bacteria from getting in the bag and causing infection. 6. Apply rubbing alcohol to a gauze pad or cotton ball. 7. Use the gauze pad or cotton ball to clean the pour spout. 8. Close the pour spout. 9. Attach the bag to your leg with adhesive tape or a leg strap. 10. Wash your hands with soap and water. How to change the drainage bag Supplies needed:  Alcohol wipes.  A clean drainage bag.  Adhesive tape or a leg strap. Changing the bag Replace your drainage bag with a clean bag if it leaks, starts to smell bad, or looks dirty. 1. Wash your hands with soap and water. 2. Detach the dirty drainage bag from your leg. 3. Pinch the catheter with your fingers so that urine does not spill out. 4. Disconnect the catheter tube from the drainage tube at the connection valve. Do not let the tubes touch any surface. 5. Clean the end of the catheter tube with an alcohol wipe. Use a different alcohol wipe to clean the end of the drainage tube. 6. Connect the catheter tube to the drainage tube of the clean bag. 7. Attach the clean bag to your leg with adhesive tape or a leg strap. Avoid attaching the new bag too tightly. 8. Wash your hands with soap and water. General instructions   Never pull on your catheter or try to remove it. Pulling can damage your internal tissues.  Always wash your hands before and after you handle your catheter or drainage bag. Use a mild, fragrance-free soap. If soap and water are not available, use hand sanitizer.  Always make sure there are no twists or bends (kinks) in the catheter tube.  Always make sure there are no leaks in the catheter or drainage bag.  Drink enough fluid to keep your urine pale  yellow.  Do not take baths, swim, or use a hot tub.  If you are female, wipe from front to back after having a bowel movement. Contact a health care provider if:  Your urine is cloudy.  Your urine smells unusually bad.  Your catheter gets clogged.  Your catheter starts to leak.  Your bladder feels full. Get help right away if:  You have redness, swelling, or pain where the catheter enters your body.  You have fluid, blood, pus, or a bad smell coming from the area where the catheter enters your body.  The area where the catheter enters your body feels warm to the touch.  You have a fever.  You have pain in your abdomen, legs, lower back,  or bladder.  You see blood in the catheter.  Your urine is pink or red.  You have nausea, vomiting, or chills.  Your urine is not draining into the bag.  Your catheter gets pulled out. Summary  An indwelling urinary catheter is a thin, flexible, germ-free (sterile) tube that is placed into the bladder to help drain urine out of the body.  The catheter is inserted into the part of the body that drains urine from the bladder (urethra).  Take good care of your catheter to keep it working properly and help prevent problems from developing.  Always wash your hands before and after you handle your catheter or drainage bag.  Never pull on your catheter or try to remove it. This information is not intended to replace advice given to you by your health care provider. Make sure you discuss any questions you have with your health care provider. Document Revised: 05/21/2018 Document Reviewed: 09/12/2016 Elsevier Patient Education  [place].

## 2019-08-09 DIAGNOSIS — D072 Carcinoma in situ of vagina: Secondary | ICD-10-CM | POA: Insufficient documentation

## 2019-08-10 ENCOUNTER — Telehealth: Payer: Self-pay | Admitting: *Deleted

## 2019-08-10 NOTE — Anesthesia Preprocedure Evaluation (Addendum)
Anesthesia Evaluation  Patient identified by MRN, date of birth, ID band Patient awake    Reviewed: Allergy & Precautions, NPO status , Patient's Chart, lab work & pertinent test results  History of Anesthesia Complications Negative for: history of anesthetic complications  Airway Mallampati: II  TM Distance: >3 FB Neck ROM: Full    Dental no notable dental hx.    Pulmonary asthma ,    Pulmonary exam normal        Cardiovascular hypertension, Pt. 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  Reproductive/Obstetrics negative OB ROS                            Anesthesia Physical Anesthesia Plan  ASA: III  Anesthesia Plan: General   Post-op Pain Management:    Induction: Intravenous  PONV Risk Score and Plan: 4 or greater and Treatment may vary due to age or medical condition, Ondansetron and Dexamethasone  Airway Management Planned: LMA  Additional Equipment:   Intra-op Plan:   Post-operative Plan: Extubation in OR  Informed Consent: I have reviewed the patients History and Physical, chart, labs and discussed the procedure including the risks, benefits and alternatives for the proposed anesthesia with the patient or authorized representative who has indicated his/her understanding and acceptance.     Dental advisory given  Plan Discussed with: CRNA  Anesthesia Plan Comments:       Anesthesia Quick Evaluation

## 2019-08-10 NOTE — Telephone Encounter (Signed)
Left message w/ [name] Richards to see if she had questions or concerns regarding her upcoming surgery tomorrow.   I spoke w/ [name] after she had left a message. Questions and concerns regarding her surgery tomorrow addressed.

## 2019-08-11 ENCOUNTER — Encounter (HOSPITAL_COMMUNITY): Payer: Self-pay | Admitting: Gynecologic Oncology

## 2019-08-11 ENCOUNTER — Encounter (HOSPITAL_COMMUNITY)
Admission: RE | Disposition: A | Discharge status: Home / Self Care | Payer: Self-pay | Source: Home / Self Care | Attending: Gynecologic Oncology

## 2019-08-11 ENCOUNTER — Ambulatory Visit (HOSPITAL_COMMUNITY)
Admission: RE | Admit: 2019-08-11 | Discharge: 2019-08-11 | Disposition: A | Discharge status: Home / Self Care | Payer: Medicare Other | Attending: Gynecologic Oncology | Admitting: Gynecologic Oncology

## 2019-08-11 ENCOUNTER — Ambulatory Visit (HOSPITAL_COMMUNITY): Payer: Medicare Other | Admitting: Physician Assistant

## 2019-08-11 ENCOUNTER — Ambulatory Visit (HOSPITAL_COMMUNITY): Payer: Medicare Other | Admitting: Anesthesiology

## 2019-08-11 DIAGNOSIS — Z880 Allergy status to penicillin: Secondary | ICD-10-CM | POA: Diagnosis not present

## 2019-08-11 DIAGNOSIS — Z8541 Personal history of malignant neoplasm of cervix uteri: Secondary | ICD-10-CM | POA: Diagnosis not present

## 2019-08-11 DIAGNOSIS — Z888 Allergy status to other drugs, medicaments and biological substances status: Secondary | ICD-10-CM | POA: Insufficient documentation

## 2019-08-11 DIAGNOSIS — C3492 Malignant neoplasm of unspecified part of left bronchus or lung: Secondary | ICD-10-CM | POA: Diagnosis not present

## 2019-08-11 DIAGNOSIS — Z79890 Hormone replacement therapy (postmenopausal): Secondary | ICD-10-CM | POA: Diagnosis not present

## 2019-08-11 DIAGNOSIS — C52 Malignant neoplasm of vagina: Secondary | ICD-10-CM

## 2019-08-11 DIAGNOSIS — D072 Carcinoma in situ of vagina: Secondary | ICD-10-CM | POA: Diagnosis not present

## 2019-08-11 DIAGNOSIS — Z885 Allergy status to narcotic agent status: Secondary | ICD-10-CM | POA: Diagnosis not present

## 2019-08-11 DIAGNOSIS — Z79899 Other long term (current) drug therapy: Secondary | ICD-10-CM | POA: Insufficient documentation

## 2019-08-11 DIAGNOSIS — E785 Hyperlipidemia, unspecified: Secondary | ICD-10-CM | POA: Diagnosis not present

## 2019-08-11 DIAGNOSIS — I10 Essential (primary) hypertension: Secondary | ICD-10-CM | POA: Insufficient documentation

## 2019-08-11 DIAGNOSIS — D071 Carcinoma in situ of vulva: Secondary | ICD-10-CM

## 2019-08-11 DIAGNOSIS — Z8544 Personal history of malignant neoplasm of other female genital organs: Secondary | ICD-10-CM | POA: Insufficient documentation

## 2019-08-11 DIAGNOSIS — Z923 Personal history of irradiation: Secondary | ICD-10-CM | POA: Insufficient documentation

## 2019-08-11 DIAGNOSIS — E039 Hypothyroidism, unspecified: Secondary | ICD-10-CM | POA: Insufficient documentation

## 2019-08-11 DIAGNOSIS — Z8542 Personal history of malignant neoplasm of other parts of uterus: Secondary | ICD-10-CM | POA: Diagnosis not present

## 2019-08-11 DIAGNOSIS — N904 Leukoplakia of vulva: Secondary | ICD-10-CM | POA: Diagnosis not present

## 2019-08-11 HISTORY — DX: Malignant neoplasm of vagina: C52

## 2019-08-11 HISTORY — PX: VAGINECTOMY, PARTIAL: SHX6846

## 2019-08-11 LAB — TYPE AND SCREEN
ABO/RH(D): B POS
Antibody Screen: NEGATIVE

## 2019-08-11 SURGERY — VAGINECTOMY, PARTIAL
Anesthesia: General

## 2019-08-11 MED ORDER — FENTANYL CITRATE (PF) 100 MCG/2ML IJ SOLN: 25.0000 ug | INTRAMUSCULAR | Status: DC | PRN

## 2019-08-11 MED ORDER — LACTATED RINGERS IV SOLN: INTRAVENOUS | Status: DC

## 2019-08-11 MED ORDER — DEXAMETHASONE SODIUM PHOSPHATE 10 MG/ML IJ SOLN
INTRAMUSCULAR | Status: AC
  Filled 2019-08-11: qty 1

## 2019-08-11 MED ORDER — EPHEDRINE 5 MG/ML INJ
INTRAVENOUS | Status: AC
  Filled 2019-08-11: qty 10

## 2019-08-11 MED ORDER — LIDOCAINE 2% (20 MG/ML) 5 ML SYRINGE
INTRAMUSCULAR | Status: AC
  Filled 2019-08-11: qty 10

## 2019-08-11 MED ORDER — ONDANSETRON HCL 4 MG/2ML IJ SOLN
INTRAMUSCULAR | Status: AC
  Filled 2019-08-11: qty 2

## 2019-08-11 MED ORDER — ONDANSETRON HCL 4 MG/2ML IJ SOLN
INTRAMUSCULAR | Status: DC | PRN
  Administered 2019-08-11: 4 mg via INTRAVENOUS

## 2019-08-11 MED ORDER — ACETAMINOPHEN 500 MG PO TABS
ORAL_TABLET | ORAL | Status: AC
  Administered 2019-08-11: 1000 mg via ORAL
  Filled 2019-08-11: qty 2

## 2019-08-11 MED ORDER — ESTRADIOL 0.1 MG/GM VA CREA
1.0000 | TOPICAL_CREAM | Freq: Every day | VAGINAL | 2 refills | Status: AC
Start: 2019-08-11 — End: 2019-09-11

## 2019-08-11 MED ORDER — MIDAZOLAM HCL 2 MG/2ML IJ SOLN
INTRAMUSCULAR | Status: AC
  Filled 2019-08-11: qty 2

## 2019-08-11 MED ORDER — GABAPENTIN 100 MG PO CAPS
ORAL_CAPSULE | ORAL | Status: AC
  Administered 2019-08-11: 100 mg via ORAL
  Filled 2019-08-11: qty 1

## 2019-08-11 MED ORDER — ORAL CARE MOUTH RINSE: 15.0000 mL | Freq: Once | OROMUCOSAL | Status: AC

## 2019-08-11 MED ORDER — GABAPENTIN 100 MG PO CAPS: 100.0000 mg | ORAL_CAPSULE | ORAL | Status: AC

## 2019-08-11 MED ORDER — CLINDAMYCIN PHOSPHATE 900 MG/50ML IV SOLN
INTRAVENOUS | Status: AC
  Filled 2019-08-11: qty 50

## 2019-08-11 MED ORDER — ACETAMINOPHEN 500 MG PO TABS: 1000.0000 mg | ORAL_TABLET | Freq: Once | ORAL | Status: DC

## 2019-08-11 MED ORDER — PROPOFOL 10 MG/ML IV BOLUS
INTRAVENOUS | Status: AC
  Filled 2019-08-11: qty 20

## 2019-08-11 MED ORDER — SODIUM CHLORIDE 0.9 % IV SOLN: 250.0000 mL | INTRAVENOUS | Status: DC | PRN

## 2019-08-11 MED ORDER — DEXAMETHASONE SODIUM PHOSPHATE 10 MG/ML IJ SOLN
INTRAMUSCULAR | Status: DC | PRN
  Administered 2019-08-11: 10 mg via INTRAVENOUS

## 2019-08-11 MED ORDER — LIDOCAINE-EPINEPHRINE (PF) 1 %-1:200000 IJ SOLN
INTRAMUSCULAR | Status: AC
  Filled 2019-08-11: qty 30

## 2019-08-11 MED ORDER — BUPIVACAINE HCL 0.25 % IJ SOLN
INTRAMUSCULAR | Status: AC
  Filled 2019-08-11: qty 1

## 2019-08-11 MED ORDER — TRAMADOL HCL 50 MG PO TABS: 100.0000 mg | ORAL_TABLET | Freq: Two times a day (BID) | ORAL | Status: DC | PRN

## 2019-08-11 MED ORDER — DEXAMETHASONE SODIUM PHOSPHATE 4 MG/ML IJ SOLN: 4.0000 mg | INTRAMUSCULAR | Status: DC

## 2019-08-11 MED ORDER — CLINDAMYCIN PHOSPHATE 900 MG/50ML IV SOLN
900.0000 mg | INTRAVENOUS | Status: AC
  Administered 2019-08-11: 900 mg via INTRAVENOUS

## 2019-08-11 MED ORDER — KETOROLAC TROMETHAMINE 15 MG/ML IJ SOLN: 15.0000 mg | INTRAMUSCULAR | Status: AC

## 2019-08-11 MED ORDER — LIDOCAINE 2% (20 MG/ML) 5 ML SYRINGE
INTRAMUSCULAR | Status: DC | PRN
  Administered 2019-08-11: 80 mg via INTRAVENOUS

## 2019-08-11 MED ORDER — ACETAMINOPHEN 650 MG RE SUPP
650.0000 mg | RECTAL | Status: DC | PRN
  Filled 2019-08-11: qty 1

## 2019-08-11 MED ORDER — PHENYLEPHRINE 40 MCG/ML (10ML) SYRINGE FOR IV PUSH (FOR BLOOD PRESSURE SUPPORT)
PREFILLED_SYRINGE | INTRAVENOUS | Status: AC
  Filled 2019-08-11: qty 10

## 2019-08-11 MED ORDER — KETOROLAC TROMETHAMINE 30 MG/ML IJ SOLN
INTRAMUSCULAR | Status: AC
  Filled 2019-08-11: qty 1

## 2019-08-11 MED ORDER — CIPROFLOXACIN IN D5W 400 MG/200ML IV SOLN
400.0000 mg | INTRAVENOUS | Status: AC
  Administered 2019-08-11: 400 mg via INTRAVENOUS

## 2019-08-11 MED ORDER — PROPOFOL 500 MG/50ML IV EMUL
INTRAVENOUS | Status: DC | PRN
  Administered 2019-08-11: 140 mg via INTRAVENOUS

## 2019-08-11 MED ORDER — CHLORHEXIDINE GLUCONATE 0.12 % MT SOLN
15.0000 mL | Freq: Once | OROMUCOSAL | Status: AC
  Administered 2019-08-11: 15 mL via OROMUCOSAL

## 2019-08-11 MED ORDER — GLYCOPYRROLATE 0.2 MG/ML IJ SOLN
0.2000 mg | Freq: Once | INTRAMUSCULAR | Status: AC
  Administered 2019-08-11: 0.2 mg via INTRAVENOUS

## 2019-08-11 MED ORDER — BUPIVACAINE LIPOSOME 1.3 % IJ SUSP
20.0000 mL | Freq: Once | INTRAMUSCULAR | Status: AC
  Administered 2019-08-11: 20 mL
  Filled 2019-08-11: qty 20

## 2019-08-11 MED ORDER — MIDAZOLAM HCL 2 MG/2ML IJ SOLN
INTRAMUSCULAR | Status: DC | PRN
  Administered 2019-08-11: 1 mg via INTRAVENOUS

## 2019-08-11 MED ORDER — ACETAMINOPHEN 325 MG PO TABS: 650.0000 mg | ORAL_TABLET | ORAL | Status: DC | PRN

## 2019-08-11 MED ORDER — BACITRACIN ZINC 500 UNIT/GM EX OINT
TOPICAL_OINTMENT | CUTANEOUS | Status: AC
  Filled 2019-08-11: qty 28.35

## 2019-08-11 MED ORDER — KETOROLAC TROMETHAMINE 15 MG/ML IJ SOLN
INTRAMUSCULAR | Status: AC
  Administered 2019-08-11: 15 mg via INTRAVENOUS
  Filled 2019-08-11: qty 1

## 2019-08-11 MED ORDER — SODIUM CHLORIDE 0.9% FLUSH: 3.0000 mL | INTRAVENOUS | Status: DC | PRN

## 2019-08-11 MED ORDER — CIPROFLOXACIN IN D5W 400 MG/200ML IV SOLN
INTRAVENOUS | Status: AC
  Filled 2019-08-11: qty 200

## 2019-08-11 MED ORDER — FENTANYL CITRATE (PF) 100 MCG/2ML IJ SOLN
INTRAMUSCULAR | Status: DC | PRN
  Administered 2019-08-11: 25 ug via INTRAVENOUS
  Administered 2019-08-11: 50 ug via INTRAVENOUS

## 2019-08-11 MED ORDER — ACETAMINOPHEN 500 MG PO TABS: 1000.0000 mg | ORAL_TABLET | ORAL | Status: AC

## 2019-08-11 MED ORDER — PROMETHAZINE HCL 25 MG/ML IJ SOLN: 6.2500 mg | INTRAMUSCULAR | Status: DC | PRN

## 2019-08-11 MED ORDER — EPHEDRINE SULFATE-NACL 50-0.9 MG/10ML-% IV SOSY
PREFILLED_SYRINGE | INTRAVENOUS | Status: DC | PRN
  Administered 2019-08-11: 10 mg via INTRAVENOUS

## 2019-08-11 MED ORDER — FENTANYL CITRATE (PF) 100 MCG/2ML IJ SOLN
INTRAMUSCULAR | Status: AC
  Filled 2019-08-11: qty 2

## 2019-08-11 MED ORDER — LIDOCAINE 2% (20 MG/ML) 5 ML SYRINGE
INTRAMUSCULAR | Status: AC
  Filled 2019-08-11: qty 5

## 2019-08-11 MED ORDER — SODIUM CHLORIDE 0.9% FLUSH: 3.0000 mL | Freq: Two times a day (BID) | INTRAVENOUS | Status: DC

## 2019-08-11 MED ORDER — BUPIVACAINE HCL (PF) 0.25 % IJ SOLN
INTRAMUSCULAR | Status: DC | PRN
  Administered 2019-08-11: 20 mL

## 2019-08-11 SURGICAL SUPPLY — 41 items
BACTOSHIELD CHG 4% 4OZ (MISCELLANEOUS) ×1
BLADE SURG 15 STRL LF DISP TIS (BLADE) ×1 IMPLANT
BLADE SURG 15 STRL SS (BLADE) ×2
BRIEF STRETCH FOR OB PAD LRG (UNDERPADS AND DIAPERS) ×2 IMPLANT
CATH FOLEY 2WAY SLVR  5CC 12FR (CATHETERS) ×2
CATH FOLEY 2WAY SLVR 5CC 12FR (CATHETERS) ×1 IMPLANT
COVER WAND RF STERILE (DRAPES) ×2 IMPLANT
DRAPE SHEET LG 3/4 BI-LAMINATE (DRAPES) ×2 IMPLANT
DRAPE UNDERBUTTOCKS STRL (DISPOSABLE) ×2 IMPLANT
DRSG TELFA 3X8 NADH (GAUZE/BANDAGES/DRESSINGS) ×2 IMPLANT
GAUZE 4X4 16PLY RFD (DISPOSABLE) ×2 IMPLANT
GLOVE BIO SURGEON STRL SZ 6 (GLOVE) ×4 IMPLANT
GLOVE BIO SURGEON STRL SZ 6.5 (GLOVE) ×2 IMPLANT
GOWN STRL REUS W/ TWL LRG LVL3 (GOWN DISPOSABLE) ×2 IMPLANT
GOWN STRL REUS W/TWL LRG LVL3 (GOWN DISPOSABLE) ×4
KIT BASIN (CUSTOM PROCEDURE TRAY) ×2 IMPLANT
KIT TURNOVER KIT A (KITS) IMPLANT
LEGGING LITHOTOMY PAIR STRL (DRAPES) ×2 IMPLANT
NEEDLE HYPO 25X1 1.5 SAFETY (NEEDLE) ×2 IMPLANT
NS IRRIG 1000ML POUR BTL (IV SOLUTION) ×2 IMPLANT
PACK LITHOTOMY IV (CUSTOM PROCEDURE TRAY) ×2 IMPLANT
PACK PERINEAL COLD (PAD) ×2 IMPLANT
PAD OB MATERNITY 4.3X12.25 (PERSONAL CARE ITEMS) ×2 IMPLANT
PENCIL SMOKE EVACUATOR (MISCELLANEOUS) IMPLANT
SCRUB CHG 4% DYNA-HEX 4OZ (MISCELLANEOUS) ×1 IMPLANT
SPONGE LAP 18X18 RF (DISPOSABLE) IMPLANT
SURGILUBE 2OZ TUBE FLIPTOP (MISCELLANEOUS) ×2 IMPLANT
SUT VIC AB 0 CT1 27 (SUTURE) ×2
SUT VIC AB 0 CT1 27XBRD ANTBC (SUTURE) ×1 IMPLANT
SUT VIC AB 2-0 SH 27 (SUTURE) ×8
SUT VIC AB 2-0 SH 27X BRD (SUTURE) ×4 IMPLANT
SUT VIC AB 3-0 SH 27 (SUTURE) ×24
SUT VIC AB 3-0 SH 27X BRD (SUTURE) ×12 IMPLANT
SUT VIC AB 4-0 PS2 27 (SUTURE) ×4 IMPLANT
SUT VIC AB 4-0 SH 27 (SUTURE) ×4
SUT VIC AB 4-0 SH 27XBRD (SUTURE) ×2 IMPLANT
SYR BULB IRRIG 60ML STRL (SYRINGE) ×2 IMPLANT
SYR CONTROL 10ML LL (SYRINGE) ×4 IMPLANT
TRAY FOLEY MTR SLVR 16FR STAT (SET/KITS/TRAYS/PACK) ×2 IMPLANT
UNDERPAD 30X36 HEAVY ABSORB (UNDERPADS AND DIAPERS) ×4 IMPLANT
YANKAUER SUCT BULB TIP 10FT TU (MISCELLANEOUS) ×2 IMPLANT

## 2019-08-11 NOTE — Op Note (Signed)
OPERATIVE NOTE  [name] DATE: 08/11/19   Preop Diagnosis: VAIN III  Postoperative Diagnosis: same  Surgery: total vaginectomy  Surgeons:  Donaciano Eva, MD Assistant: Dr Lahoma Crocker (an MD assistant was necessary due to the complexity of this radical procedure to perform tissue manipulation, retraction and positioning due to the complexity of the case and hospital policies).   Anesthesia: General   Estimated blood loss: 81ml  IVF:  284ml   Urine output: 903 ml   Complications: None   Pathology: vaginal mucosa, unoriented  Operative findings: Forshortened (3cm) vagina, friable 4cm plaque on the anterior vagina, extending to the right vaginal fornix and left posterior vaginal wall. This necessitated a total vaginectomy to resect the entire lesion.   Procedure: The patient was identified in the preoperative holding area. Informed consent was signed on the chart. Patient was seen history was reviewed and exam was performed.   The patient was then taken to the operating room and placed in the supine position with SCD hose on. General anesthesia was then induced without difficulty. She was then placed in the dorsolithotomy position. The perineum was prepped with Betadine. The vagina was prepped with Betadine. The patient was then draped after the prep was dried. A Foley catheter was inserted into the bladder under sterile conditions.  Timeout was performed the patient, procedure, antibiotic, allergy, and length of procedure. 5% acetic acid solution was applied to the vagina. The vaginal mass was identified. It was apparent that this extended bilaterally and anteriorally and posteriorally. Given the lack of existing vaginal tissue, a total vaginectomy was necessary.  The 15 blade scalpel was used to incise the vaginal mucosa beyond the visible borders of the vaginal dysplasia. The mezenbaum scissors were used to carefully dissect the vaginal mucosa from  the underlying tissues. Due to atrophy, menopause and prior radiation the tissues were extremely thin. The specimen was not able to be removed in one piece due to its friability and therefore was removed fragmented as the tissues tore easily with gentle manipulation.  The endopelvic fascial plane was entered anteriorally below the level of the urethra and the bladder was dissected off of the vagina. The lateral margins were freed and the posterior vaginal mucosa was dissected off of the rectum. All visible and palpable dysplasia was removed. The bovie was carefully used in the tissue bed to reinforce hemostasis.  A double gloved hand confirmed no rectal injury. The urine in the foley bag was clear and free of blood.  The endopelvic fascia was imbricated with 3-0 vicryl achieving reapproximation of tissues and hemostasis.  The vaginal mucosa was reapproximated using 3-0 vicryl in a horizontal incision, thus closing the vagina.   Bacitracen was applied to the vaginal incision.   A second rectal exam confirmed no suture material or stricture or injury to the rectum was palpable.  All instrument, suture, laparotomy, Ray-Tec, and needle counts were correct x2. The patient tolerated the procedure well and was taken recovery room in stable condition. This is [name] Richards dictating an operative note on [name] Richards.

## 2019-08-11 NOTE — Anesthesia Procedure Notes (Signed)
Procedure Name: LMA Insertion Date/Time: 08/11/2019 7:28 AM Performed by: Gerald Leitz, CRNA Pre-anesthesia Checklist: Patient identified, Patient being monitored, Timeout performed, Emergency Drugs available and Suction available Patient Re-evaluated:Patient Re-evaluated prior to induction Oxygen Delivery Method: Circle system utilized Preoxygenation: Pre-oxygenation with 100% oxygen Induction Type: IV induction Ventilation: Mask ventilation without difficulty LMA: LMA inserted and LMA with gastric port inserted LMA Size: 4.0 Tube type: Oral Number of attempts: 1 Placement Confirmation: positive ETCO2 and breath sounds checked- equal and bilateral Tube secured with: Tape Dental Injury: Teeth and Oropharynx as per pre-operative assessment

## 2019-08-11 NOTE — Interval H&P Note (Signed)
History and Physical Interval Note:  08/11/2019 7:05 AM  [name] Richards  has presented today for surgery, with the diagnosis of Vaginal intraepithelial neoplasia III.  The various methods of treatment have been discussed with the patient and family. After consideration of risks, benefits and other options for treatment, the patient has consented to  Procedure(s): DISTAL VAGINECTOMY,PARTIAL (N/A) as a surgical intervention.  The patient's history has been reviewed, patient examined, no change in status, stable for surgery.  I have reviewed the patient's chart and labs. Her pathology showed VAIN III and therefore primary resection will be attempted. Questions were answered to the patient's satisfaction.     Thereasa Solo

## 2019-08-11 NOTE — Anesthesia Postprocedure Evaluation (Signed)
Anesthesia Post Note  [name]  Procedure(s) Performed: TOTAL VAGINECTOMY (N/A )     Patient location during evaluation: PACU Anesthesia Type: General Level of consciousness: awake and alert and oriented Pain management: pain level controlled Vital Signs Assessment: post-procedure vital signs reviewed and stable Respiratory status: spontaneous breathing, nonlabored ventilation and respiratory function stable Cardiovascular status: blood pressure returned to baseline Postop Assessment: no apparent nausea or vomiting Anesthetic complications: no Comments: Called to PACU at approx 09:00 for symptomatic bradycardia. HR sinus in 30s, MAP low 60s. Patient with mild lightheadedness and feeling sleepy, but awake and conversant. Glycopyrolate given with improvement of HR to 60s and MAP to high 70s. Daiva Huge, MD   No complications documented.  Last Vitals:  Vitals:   08/11/19 1015 08/11/19 1051  BP: 117/65 140/71  Pulse: (!) 53 62  Resp: 16 10  Temp:  (!) 36.4 C  SpO2: 100% 98%    Last Pain:  Vitals:   08/11/19 1051  TempSrc: Axillary  PainSc:                  Brennan Bailey

## 2019-08-11 NOTE — Transfer of Care (Signed)
Immediate Anesthesia Transfer of Care Note  [name]  Procedure(s) Performed: Procedure(s): TOTAL VAGINECTOMY (N/A)  Patient Location: PACU  Anesthesia Type:General  Level of Consciousness: Alert, Awake, Oriented  Airway & Oxygen Therapy: Patient Spontanous Breathing  Post-op Assessment: Report given to RN  Post vital signs: Reviewed and stable  Last Vitals:  Vitals:   08/11/19 0627  BP: (!) 178/75  Pulse: 68  Resp: 16  Temp: 37 C  SpO2: 34%    Complications: No apparent anesthesia complications

## 2019-08-12 ENCOUNTER — Encounter (HOSPITAL_COMMUNITY): Payer: Self-pay | Admitting: Gynecologic Oncology

## 2019-08-12 ENCOUNTER — Telehealth: Payer: Self-pay

## 2019-08-12 NOTE — Telephone Encounter (Signed)
Post Op call to patient:  Patient reports she is doing well, no pain and no pain meds taken.  She is up walking without difficulty.  Eating and drinking well.  Denies temp.  Reports she is passing some gas but has not had a bm.  She has taken Miralax and stool softener today.  Patient reports she will increase to BID if needed.  Patient knows to call office if any issues or concerns arise.

## 2019-08-16 LAB — SURGICAL PATHOLOGY

## 2019-08-18 ENCOUNTER — Inpatient Hospital Stay: Payer: Medicare Other | Admitting: Gynecologic Oncology

## 2019-08-18 ENCOUNTER — Other Ambulatory Visit: Payer: Self-pay

## 2019-08-18 ENCOUNTER — Inpatient Hospital Stay: Payer: Medicare Other | Attending: Gynecologic Oncology | Admitting: Gynecologic Oncology

## 2019-08-18 VITALS — BP 142/71 | HR 67 | Temp 98.5°F | Resp 18 | Ht 65.0 in | Wt 152.5 lb

## 2019-08-18 DIAGNOSIS — Z8542 Personal history of malignant neoplasm of other parts of uterus: Secondary | ICD-10-CM | POA: Insufficient documentation

## 2019-08-18 DIAGNOSIS — Z9889 Other specified postprocedural states: Secondary | ICD-10-CM

## 2019-08-18 DIAGNOSIS — C519 Malignant neoplasm of vulva, unspecified: Secondary | ICD-10-CM

## 2019-08-18 DIAGNOSIS — Z90710 Acquired absence of both cervix and uterus: Secondary | ICD-10-CM | POA: Insufficient documentation

## 2019-08-18 DIAGNOSIS — Z90722 Acquired absence of ovaries, bilateral: Secondary | ICD-10-CM | POA: Insufficient documentation

## 2019-08-18 DIAGNOSIS — Z9079 Acquired absence of other genital organ(s): Secondary | ICD-10-CM | POA: Insufficient documentation

## 2019-08-18 NOTE — Patient Instructions (Signed)
You are healing well. Please let us know if you have any difficulty urinating. You can apply Vaseline to the irritated areas on the vulva.  Continue using the peri bottle after urinating or bowel movements.  The after hours number is [phone] and you can tell the after hours nurse that you had surgery and they can contact Dr. Lahoma Crocker who is on call.  Dr. Denman George will call you after tumor board on Monday to discuss recommendations moving forward. Please call for any needs.

## 2019-08-18 NOTE — Progress Notes (Signed)
Gynecologic Oncology Post-op Follow Up  Patient presents today for post-operative follow up with foley catheter removal. She has been doing well at home since surgery with no pain reported.  The lichen sclerosis on her vulva has flared up due to increased irritation from the catheter tubing. Foley has been draining adequate amounts of urine. Bowels functioning with three bowel movements today as far with use of miralax nightly and colace.  No abnormal drainage reported vaginally. No fever, chills. She has been walking as exercise with no difficulty. Reporting minimal amount of vaginal spotting.   Exam: Alert, oriented x3, in no acute distress.  Lungs clear. Heart regular rate and rhythm. Vaginal incision intact with no evidence of infection. Vulvar irritation and erythema noted. Foley catheter removed at 9:30am with no difficulty. No lower extremity edema noted.  Final path discussed. Advised that her case/pathology is to be discussed at our upcoming tumor board on Monday, July 12 and she would be contacted with recommendations from Dr. Denman George.  Copy of path report given. All questions answered. Advised her to continue use of peri-bottle after urination and bowel movements.  Advised to use a swipe of vaseline on the area of vulvar irritation as a barrier until the raw-like area heals. [place] and after hours contact information given.      FINAL MICROSCOPIC DIAGNOSIS:   A. VAGINAL MUCOSA, VAGINECTOMY:  - High-grade squamous intraepithelial lesion (VaIN3, high grade  dysplasia), with foci suspicious for at least superficial invasion  - The suspicious foci are present at the edges of tissue fragments

## 2019-08-19 ENCOUNTER — Telehealth: Payer: Self-pay

## 2019-08-19 NOTE — Telephone Encounter (Signed)
[name] states that she has been urinating well since yesterday. She has not urinated since 0800.  She has ten in 32 oz of fluid since 0800. She feels the need to urinate, however she is in a car going to the mountains. She will call once she reaches her destination if she is experiencing difficulty urinating.

## 2019-08-19 NOTE — Telephone Encounter (Signed)
[name] Richards called and stated that she urinated a large amount and is not having any difficulty emptying bladder.

## 2019-08-22 ENCOUNTER — Telehealth: Payer: Self-pay | Admitting: Gynecologic Oncology

## 2019-08-22 ENCOUNTER — Other Ambulatory Visit: Payer: Self-pay | Admitting: Oncology

## 2019-08-22 ENCOUNTER — Telehealth: Payer: Self-pay | Admitting: Oncology

## 2019-08-22 DIAGNOSIS — C519 Malignant neoplasm of vulva, unspecified: Secondary | ICD-10-CM

## 2019-08-22 NOTE — Addendum Note (Signed)
Addended by: Elmo Putt R on: 08/22/2019 12:32 PM   Modules accepted: Orders

## 2019-08-22 NOTE — Progress Notes (Signed)
Gynecologic Oncology Multi-Disciplinary Disposition Conference Note  Date of the Conference: 08/22/2019  Patient Name: [name] Richards  Primary GYN Oncologist: Dr. Denman George  Stage/Disposition:  Recurrent invasive vulvar cancer. Disposition is for a PET scan followed by radiation therapy.   This Multidisciplinary conference took place involving physicians from [place], [place], Radiation Oncology, Pathology, Radiology along with the Gynecologic Oncology Nurse Practitioner and RN.  Comprehensive assessment of the patient's malignancy, staging, need for surgery, chemotherapy, radiation therapy, and need for further testing were reviewed. Supportive measures, both inpatient and following discharge were also discussed. The recommended plan of care is documented. Greater than 35 minutes were spent correlating and coordinating this patient's care.

## 2019-08-22 NOTE — Telephone Encounter (Signed)
Informed patient of results of invasive carcinoma. Recommend RT to the vulva/vagina (distally).  She will have PET scan to evaluate for distant mets  She will see Dr Sondra Come.  Thereasa Solo, MD

## 2019-08-22 NOTE — Telephone Encounter (Signed)
[name] Richards with PET scan appointment on 08/30/19 at 3 pm (2:30 arrival, NPO 6 hours prior except for water) and appointment with Dr. Sondra Come on 09/05/19 (8:30 nurse eval, 9:00 consult) and also scheduled genetic counseling at 1 pm on 09/08/2019.  She verbalized understanding and agreement of instructions.

## 2019-08-23 ENCOUNTER — Telehealth: Payer: Self-pay | Admitting: Oncology

## 2019-08-23 ENCOUNTER — Telehealth: Payer: Self-pay | Admitting: Medical Oncology

## 2019-08-23 ENCOUNTER — Telehealth: Payer: Self-pay | Admitting: Radiation Oncology

## 2019-08-23 NOTE — Telephone Encounter (Signed)
[name] regarding canceled genetics and radiation appointments.  She said she was very overwhelmed yesterday with all the appointments.  She has decided that she is not going to have radiation or surgery.  She decided to reschedule her PET scan as well to 6 weeks after surgery to give her body time to heal.  She will keep her follow up with Dr. Denman George on 09/05/19 and will call back to reschedule genetics at a later date. She said the most important things in her life right now are friends, family and travel and doesn't want any treatments that would interfere with enjoying them.

## 2019-08-23 NOTE — Telephone Encounter (Signed)
Pt called to cancel 8/2 appt -she is having PET scan on 8/12. She will call back to r/s appt with [facility] after 8/12.Marland Kitchen

## 2019-08-23 NOTE — Telephone Encounter (Signed)
Patient called to cancel her 7/26 appt w/Nurse and Dr. Sondra Come. Patient stated that she would call back and reschedule when she felt it was needed for radiation.

## 2019-08-25 ENCOUNTER — Ambulatory Visit: Payer: Medicare Other | Admitting: Gynecologic Oncology

## 2019-08-25 ENCOUNTER — Encounter: Payer: Self-pay | Admitting: Gynecologic Oncology

## 2019-08-30 ENCOUNTER — Ambulatory Visit (HOSPITAL_COMMUNITY): Payer: Medicare Other

## 2019-08-31 ENCOUNTER — Telehealth: Payer: Self-pay | Admitting: Oncology

## 2019-08-31 NOTE — Telephone Encounter (Signed)
[name] regarding her request for a second opinion on her pathology.  She said to hold off for now because she has decided not to have any treatment.

## 2019-09-05 ENCOUNTER — Other Ambulatory Visit: Payer: Self-pay

## 2019-09-05 ENCOUNTER — Ambulatory Visit: Payer: Medicare Other

## 2019-09-05 ENCOUNTER — Inpatient Hospital Stay (HOSPITAL_BASED_OUTPATIENT_CLINIC_OR_DEPARTMENT_OTHER): Payer: Medicare Other | Admitting: Gynecologic Oncology

## 2019-09-05 ENCOUNTER — Encounter: Payer: Self-pay | Admitting: Gynecologic Oncology

## 2019-09-05 ENCOUNTER — Ambulatory Visit: Payer: Medicare Other | Admitting: Radiation Oncology

## 2019-09-05 ENCOUNTER — Encounter: Payer: Self-pay | Admitting: Oncology

## 2019-09-05 VITALS — BP 160/76 | HR 66 | Temp 98.5°F | Resp 17 | Ht 65.0 in | Wt 150.6 lb

## 2019-09-05 DIAGNOSIS — C519 Malignant neoplasm of vulva, unspecified: Secondary | ICD-10-CM | POA: Diagnosis present

## 2019-09-05 DIAGNOSIS — Z90722 Acquired absence of ovaries, bilateral: Secondary | ICD-10-CM | POA: Diagnosis not present

## 2019-09-05 DIAGNOSIS — Z8542 Personal history of malignant neoplasm of other parts of uterus: Secondary | ICD-10-CM | POA: Diagnosis not present

## 2019-09-05 DIAGNOSIS — Z9889 Other specified postprocedural states: Secondary | ICD-10-CM | POA: Diagnosis not present

## 2019-09-05 DIAGNOSIS — Z9079 Acquired absence of other genital organ(s): Secondary | ICD-10-CM | POA: Diagnosis not present

## 2019-09-05 DIAGNOSIS — Z90710 Acquired absence of both cervix and uterus: Secondary | ICD-10-CM | POA: Diagnosis not present

## 2019-09-05 NOTE — Progress Notes (Signed)
Follow-up Note: Gyn-Onc   [name] Richards 74 y.o. female  Chief Complaint  Patient presents with  . Vulvar cancer ([place])    Post Op    Assessment and Plan : History of recurrent vulvar cancer most recently having undergone a small modified radical vulvectomy of the right side on January 13, 2017.  Prior vulvar cancer status post modified posterior radical vulvectomy on 02/06/2015.   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  I discussed with [name] Richards and her friend, [name] Richards, the findings from the pathology. I explained why fragmentation of the specimen from surgery was unavoidable, and how this made pathologic interpretation difficult. I explained that the pathologists reviewed the pathology with me and other members of the multidisciplinary team at tumor board and the consensus was made that this was an invasive cancer with tumor abutting the deep margins. Therefore there is microscopic residual disease remaining.  I explained that our recommendation is adjuvant radiation to the vulva/vagina with curative intent. I explained that if she opts for no treatment, the cancer will certainly regrow, in which case the only option at that time would be total pelvic exenteration, or salvage radiation with much higher dosing and lower probability for cure, and higher probability for toxicity. [name] Richards expressed her primary focus/priority was quality of life. I explained that I can guarantee quality of life will be much worse if she opts for salvage treatment rather than adjuvant therapy. I explained that if she opts for no further treatment, her cancer will likely regrow as a fungating mass at the perineum causing fistula and urethral obstruction. I explained that this too would result in very poor quality of life, potentially for a prolonged period of time.  [name] Richards seems to be predominantly focussed on preserving her vacations (to [place]) scheduled for September and October.  She is not interested in receiving therapy at the expense of these vacations and not interested in receiving therapy that would result in toxicities from which she will suffer during the trips. To this I explained that adjuvant therapy is typically of no benefit if given more than 3 months after diagnosis/surgical excision. If she were to wait until the end of October, it is likely that she has lost the opportunity for adjuvant radiation, though I will defer this issue for Dr Clabe Seal consideration.   [name] Richards left the visit still ambivalent about radiation, but willing to engage in a consultation with Dr Sondra Come. This consultation had been scheduled for 2 weeks ago, however [name] Richards had cancelled this as she felt she was being "rushed". She insisted this consultation was scheduled before she was told of this recommendation, however, the time stamp on my conversation with [name] Richards (in which I recommended radiation) and when the consultation (and PET scan) supports that she was informed of our recommendation before these appointments were scheduled. As it stands now, [name] Richards is anxious for [place] to reschedule these appointments as soon as possible as she now ackowledges that there is a need to expedite any potential therapy, or at least the opportunity to be considered for treatment before she leaves.   I gave [name] Richards and her friend an overview of anticipated likely toxicities of radiation, particularly of wet desquamation and scarring to the genitals, possible incontinence. I explained that Dr Sondra Come is better qualified to counsel about these toxicities and also the planned course of therapy.   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 She underwent  a total abdominal hysterectomy and bilateral salpingo-oophorectomy, pelvic and para-aortic lymphadenectomy. She received postoperative whole pelvis radiation therapy.  Her other primary  gynecologic problem has been lichen sclerosus managed with when necessary clobetasol.  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  In December 2016 the patient was found to have a new primary squamous cell carcinoma the vulva undergoing a modified radical vulvectomy on 02/06/2015. She underwent a modified radical vulvectomy at [facility] on 02/06/2015. She was found to have a 3 cm vulvar lesion of the posterior vulva extending into the posterior vagina. A rhomboid flap was required to close the incision.  Final pathology showed some close surgical margins. However, the specimen margins were toward the time of surgery and it was Dr Mora Bellman impression that they were widely around the primary cancer. She discussed options with the patient and they agreed to monitor her closely but not recommend any radiation therapy to the vulva.  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  Depth of invasion was 4 mm all margins were negative.  Follow-up PET scan March 23, 2017 showed no evidence of metastatic disease.  Examination of the vulva during routine surveillance on 07/20/19 showed a friable, verrucous lesion measuring approximately 2 cm was seen on the anterior wall of the suburethral vagina extending to the introitus and wrapping around to the right mid vagina. This was concerning for recurrence and was biopsied at that appointment. 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   Interval Hx:  On 08/11/19 she underwent total vaginectomy. Intraoperative findings were significant for a forshortened 3cm vagina, friable 4cm plaque on the anterior vagina extending to the right fornix and left posterior vaginal wall. This necessitated a total vaginectomy to resect the lesion grossly. Her tissues were extremely  friable due to age, prior radiation, menopause and lichen sclerosis. There was unavoidable fragmentation of the tissues during the resection. All gross visible disease was removed. Surgery was uncomplicated, and she reported no postop pain.  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 The suspicious foci are present at the edges of tissue fragments.  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   The patient was called and notified of this finding and plan on 08/22/19 and subsequent orders were made for radiation oncology and PET scan. The patient cancelled these the next day as she felt "rushed" and did not want treatment as she was planning on [place] travel in the fall and did not want to cancel these plans nor have side effects from treatment during her trip.   Review of Systems:10 point review of systems is negative except as noted in interval history.   Vitals: Blood pressure (!) 160/76, pulse 66, temperature 98.5 F (36.9 C), temperature source Oral, resp. rate 17, height 5\' 5"  (1.651 m), weight 150 lb 9.6 oz (68.3 kg), SpO2 100 %.  Physical Exam: General : The patient is a healthy woman in no acute distress.  HEENT: normocephalic, extraoccular movements normal; neck is supple without thyromegally  Lynphnodes: Supraclavicular and inguinal nodes not enlarged  Abdomen: Soft, non-tender, no ascites, no organomegally, no masses, no hernias  Pelvic:  EGBUS: vaginal depth is approximately  2cm. There are no gross lesions on inspection. Suture line at new vault is visible without speculum insertion and is healing well with no cellulitis.  Rectal: deferred   Allergies  Allergen Reactions  . Penicillins Hives, Rash and Other (See Comments)    Has patient had a  PCN reaction causing immediate rash, facial/tongue/throat swelling, SOB or lightheadedness with hypotension: NO Has patient had a PCN reaction causing SEVERE RASH INVOLVING MUCUS MEMBRANES OR SKIN NECROSIS: YES Has patient had a PCN reaction that REQUIRED HOSPITALIZATION : patient denies hospitalization Has patient had a PCN reaction occurring within the last 10 years: NO    . Percocet [Oxycodone-Acetaminophen] Other (See Comments)    Made pt feel real funny, never wanting to take it again; near syncopal episode  . Prochlorperazine Other (See Comments)    extrapyramidal reaction Jaws locked    Past Medical History:  Diagnosis Date  . 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 Asthma   . Cervical cancer ([place]) 1997   poorly differentiated cervical carcinoma. S/p TAH and radiation x6 wks   . Complication of anesthesia    took 2 days to wake up after 1 surgery at 20, pt can't use a adult cath,needs peds. cath due to past injury  . Constipation    very worried about this  . Endometrial cancer ([place]) 1997   coincidental endometrial cancer also  . Hyperlipemia   . Hypertension   . Hypothyroidism   . Primary cancer of left upper lobe of lung ([place]) 08/01/2015  . Small bowel obstruction ([place])    12/2008, repeat SBO in 02/2011 also with terminal ilietis   . Thyroid disease   . Vertebral body hemangioma    T12    Past Surgical History:  Procedure Laterality Date  . ABDOMINAL HYSTERECTOMY  Feb 1997   TAH/BSO, pelvic and periaortic lymphadenectomy   . BACK SURGERY    . EYE SURGERY     Lasik   . LYMPH NODE DISSECTION Left 08/01/2015   Procedure: LYMPH NODE DISSECTION;  Surgeon: Melrose Nakayama, MD;  Location: [facility];  Service: Thoracic;  Laterality: Left;  . PARTIAL NEPHRECTOMY     right  . SEGMENTECOMY Left 08/01/2015   Procedure: Left Upper Lobe SEGMENTECTOMY;  Surgeon: Melrose Nakayama, MD;  Location: [facility];  Service: Thoracic;  Laterality: Left;  . SKIN  BIOPSY    . TUBAL LIGATION    . TUMOR REMOVAL     meningioma  T 12  . VAGINECTOMY, PARTIAL N/A 08/11/2019   Procedure: TOTAL VAGINECTOMY;  Surgeon: Everitt Amber, MD;  Location: [facility];  Service: Gynecology;  Laterality: N/A;  . VIDEO ASSISTED THORACOSCOPY (VATS)/WEDGE RESECTION Left 08/01/2015   Procedure: VIDEO ASSISTED THORACOSCOPY (VATS)/WEDGE RESECTION;  Surgeon: Melrose Nakayama, MD;  Location: [facility];  Service: Thoracic;  Laterality: Left;  Marland Kitchen VULVA SURGERY     x 2, 1 at [facility] (2016), and [facility]     Current Outpatient Medications  Medication Sig Dispense Refill  . amLODipine (NORVASC) 2.5 MG tablet Take 2.5 mg by mouth daily.     . benazepril-hydrochlorthiazide (LOTENSIN HCT) 20-25 MG tablet Take 1 tablet by mouth daily.     Marland Kitchen BIOTIN PO Take 1 tablet by mouth 4 (four) times a week.    . Cholecalciferol (VITAMIN D3 PO) Take 1 tablet by mouth in the morning and at bedtime.    . docusate sodium (COLACE) 100 MG capsule Take 100 mg  by mouth 2 (two) times daily.    Marland Kitchen estradiol (ESTRACE VAGINAL) 0.1 MG/GM vaginal cream Place 1 Applicatorful vaginally daily. 42.5 g 2  . Fexofenadine HCl (ALLEGRA PO) Take by mouth daily.    Marland Kitchen levothyroxine (SYNTHROID, LEVOTHROID) 75 MCG tablet Take 75 mcg by mouth daily.    Marland Kitchen MAGNESIUM PO Take 1 tablet by mouth daily.    . Multiple Vitamin (MULTIVITAMIN WITH MINERALS) TABS tablet Take 1 tablet by mouth daily.    . Omega-3 Fatty Acids (FISH OIL PO) Take 1 capsule by mouth in the morning and at bedtime.    . polyethylene glycol (MIRALAX / GLYCOLAX) packet Take 11.3333 g by mouth at bedtime. 2/3 capful    . simvastatin (ZOCOR) 20 MG tablet Take 20 mg by mouth at bedtime.      No current facility-administered medications for this visit.    Social History   Socioeconomic History  . Marital status: Divorced    Spouse name: Not on file  . Number of children: Not on file  . Years of education: Not on file  . Highest education level: Not on file   Occupational History  . Not on file  Tobacco Use  . Smoking status: Never Smoker  . Smokeless tobacco: Never Used  Vaping Use  . Vaping Use: Never used  Substance and Sexual Activity  . Alcohol use: No  . Drug use: No  . Sexual activity: Never  Other Topics Concern  . Not on file  Social History Narrative   Lives at home alone, works out at [employer] 6 days a week, still very active. Has an ex-husband who she is in contact with. Son died of an MI and another son in [place].    Social Determinants of Health   Financial Resource Strain:   . Difficulty of Paying Living Expenses:   Food Insecurity:   . Worried About Charity fundraiser in the Last Year:   . Arboriculturist in the Last Year:   Transportation Needs:   . Film/video editor (Medical):   Marland Kitchen Lack of Transportation (Non-Medical):   Physical Activity:   . Days of Exercise per Week:   . Minutes of Exercise per Session:   Stress:   . Feeling of Stress :   Social Connections:   . Frequency of Communication with Friends and Family:   . Frequency of Social Gatherings with Friends and Family:   . Attends Religious Services:   . Active Member of Clubs or Organizations:   . Attends Archivist Meetings:   Marland Kitchen Marital Status:   Intimate Partner Violence:   . Fear of Current or Ex-Partner:   . Emotionally Abused:   Marland Kitchen Physically Abused:   . Sexually Abused:     Family History  Problem Relation Age of Onset  . Hypertension Other   . Diabetes Other   . Stroke Other   . CAD Other     45 minutes of direct face to face counseling time was spent with the patient. This included complex discussion about prognosis, therapy recommendations, toxicities of therapy and postoperative side effects and are beyond the scope of routine postoperative care.  Thereasa Solo, MD 09/05/2019, 5:37 PM

## 2019-09-05 NOTE — Patient Instructions (Signed)
Dr Denman George is recommending a consultation with Dr Sondra Come to discuss radiation treatments. It may not be possible to delay this treatment until late October. Dr Sondra Come will be able to discuss the nature of the radiation and side effects. He will also be able to predict if it is possible to have this before your travel plans.   If you elect for no further treatments, please return to see Dr Denman George in 3 months for follow-up.

## 2019-09-05 NOTE — Progress Notes (Signed)
Met with [name] Richards after her follow up with Dr. Denman George.  Advised her of appointment with Dr. Sondra Come on 09/08/19 and rescheduled PET scan for 09/13/19.  Advised her to call with any questions or needs.

## 2019-09-06 ENCOUNTER — Encounter: Payer: Self-pay | Admitting: Internal Medicine

## 2019-09-06 ENCOUNTER — Other Ambulatory Visit: Payer: Self-pay | Admitting: *Deleted

## 2019-09-06 DIAGNOSIS — C3490 Malignant neoplasm of unspecified part of unspecified bronchus or lung: Secondary | ICD-10-CM

## 2019-09-06 NOTE — Progress Notes (Signed)
istat creatine ordered.  Patient needs labs prior to CT.

## 2019-09-07 ENCOUNTER — Telehealth: Payer: Self-pay | Admitting: Internal Medicine

## 2019-09-07 NOTE — Telephone Encounter (Signed)
Scheduled appt per 7/27 sch msg - unable to reach pt .left message with appt date and time

## 2019-09-07 NOTE — Progress Notes (Signed)
Name: [name] Richards  MRN: [id number]         Date: 09/08/2019                      DOB: 1945/04/09  CC:Harlan Stains, MD  Harlan Stains, MD   REFERRING PHYSICIAN: Harlan Stains, MD  DIAGNOSIS: There were no encounter diagnoses.  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 female who is seen as a courtesy of Dr. Denman George for an opinion concerning radiation therapy as part of management for her recurrent invasive vulvar cancer. Today, she is accompanied by a friend. Initially, the patient presented with a poorly differentiated endometrial carcinoma and endocervical adenocarcinoma in February of 1997. She underwent a total abdominal hysterectomy and bilateral salpingo-oophorectomy, and pelvic and para-aortic lymphadenectomy. She received post-operative whole pelvis radiation therapy and was followed by Dr. Fermin Schwab for observation. 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 She also has a history of lichen sclerosis of the vulva that has been stable.  On 01/15/2015, the patient met with Dr. Denman George after developing recurrence of symptoms and vulvar irritation. She was found to have a new vulvar mass at the posterior left introitus that was concerning for invasive vulvar cancer. Vulvar biopsy on that day revealed invasive squamous cell carcinoma.  PET scan on 01/30/2015 showed an approximate 2.7 cm hypermetabolic focus in the region of the posterior vulva and urethral meatus. It was unclear whether or not it represented the patient's primary site of disease or urine contamination.  There was also noted to be an enlarging sub solid nodule within the left upper lobe that exhibited mild, borderline malignant range FDG uptake and was suspicious for low-grade indolent pulmonary neoplasm such as adenocarcinoma. There were no findings to suggest metastatic disease from the diagnosed vulvar carcinoma and there was no focal hypermetabolic activity to suggest skeletal metastasis.  The patient underwent a modified radical vulvectomy on 02/06/2015 at [facility] that was performed by Dr. Fermin Schwab. She was found to have a 3 cm vulvar lesion of the posterior vulva that extended into the posterior vagina. Final pathology showed some close surgical margins. However, the specimen margins were widely around the primary cancer.  The patient began seeing Dr. Lamonte Sakai on 07/10/2015 for enlarging pulmonary nodule seen on the above PET scan, first noted on CT scan in 2013. They discussed serial scans versus biopsy versus bronchoscopy versus surgical resection. Dr. Lamonte Sakai recommended surgical resection and referred the patient to thoracic surgery.   CT scan of chest on 07/12/2015 showed that the solid component of the large part solid nodule in the left upper lobe measured 9 x 3 mm with a mean diameter of 6 mm. The persistent part solid nodules with solid components were greater than or equal to 6 mm and were considered highly suspicious for pulmonary adenocarcinoma. The solid nodule within the right lower lobe was stable.  The patient underwent a left video-assisted thoracoscopy, wedge resection, thoracoscopic lingular sparing left upper lobectomy, and lymph node dissection on 08/01/2015 that was performed by Dr. Roxan Hockey. Pathology from the procedure revealed well-differentiated adenocarcinoma that spanned 2.5 cm. The surgical resection margins  were negative for carcinoma. Seven lymph nodes were dissected and biopsied, all of which were negative for carcinoma.  The patient was referred to Dr.  Julien Nordmann for further evaluation of the lung cancer and was seen in consultation on 09/27/2015. At that time, they discussed how there was no survival benefit for adjuvant systemic chemotherapy or radiation to a patient with a stage IA after surgical resection and that the standard of care would be observation and close monitoring.  Restaging chest CT scan on 03/20/2016 showed interval lingular sparing left upper lobectomy. There was a mildly irregular band-like opacity along the suture line in the medial left upper lobe that was most consistent with post-surgical scarring and warranted continued attention on follow-up chest CT. There was no evidence of metastatic disease in the chest.  The patient underwent a small modified radical vulvectomy of a new lesion of the right introitus on 11/17/2016 that was performed by Dr. Fermin Schwab. Pathology revealed invasive squamous cell carcinoma. Depth of invasion was 4 mm and all margins were negative.  PET scan and repeat chest CT scans from 2018 - 2020 showed stable post-surgical changes without evidence of localized recurrence or metastatic disease. She remained under observation of gynecologic oncology and medical oncology.  The patient was doing well until 07/20/2019 when she had followed up with Dr. Denman George regarding a couple episodes of vaginal spotting. On examination, there was noted to be a verrucous lesion measuring approximately 2 cm on the anterior wall of the suburethral vagina that extended to the introitus and wrapped around to the right mid vagina. A distal anterior vaginal biopsy was performed at that time and revealed VAIN III high-grade vaginal intraepithelial neoplasia. The stroma was inflamed and focally the base of the epithelium was irregular and focal, so superficial stromal invasion could not be ruled out. Given those findings, Dr. Denman George recommended an excision with partial anterior distal vaginectomy.  Given intraoperative findings  significant for a for-shortened 3 cm vagina with friable 4 cm plaque on the anterior vagina that extended to the right fornix and left posterior vaginal wall, the patient ended up undergoing a total vaginectomy on 08/11/2019 that was performed by Dr. Denman George. All gross visible disease was removed. Pathology from the procedure revealed high-grade squamous intraepithelial lesion (VaIN3, high-grade dysplasia) with foci suspicious for at least superficial invasion. The suspicious foci were present at the edges of tissue fragments.  The patient's case was discussed at the gynecologic oncology multi-disciplinary conference on 08/22/2019, during which time it was recommended that she proceed with a PET scan followed by radiation therapy. The consensus was made that it was an invasive cancer with tumor abutting the deep margins and therefore, there was microscopic residual disease remaining.  The patient was last seen by Dr. Denman George on 09/05/2019. At that time, they discussed adjuvant radiation to the vulva/vagina with curative intent. The patient left that visit questioning whether or not she wanted to proceed with radiation because she has trips to [place] scheduled in the near future and is concerned about the associated toxicities.  PREVIOUS RADIATION THERAPY: Yes. The patient has a history of poorly differentiated endometrial carcinoma and endocervical adenocarcinoma in February of 1997. She underwent a total abdominal hysterectomy and bilateral salpingo-oophorectomy, and pelvic and para-aortic lymphadenectomy. She received post-operative whole pelvis radiation therapy.  Past/Anticipated interventions by Gyn/Onc surgery, if any: none  Past/Anticipated interventions by medical oncology, if any: none  Weight changes, if any: }no  Bowel/Bladder complaints, if any: no Nausea/Vomiting, if any: no Pain  issues, if any:  no  SAFETY ISSUES:  Prior radiation? yes  Pacemaker/ICD? no  Possible current  pregnancy? hysterectomy  Is the patient on methotrexate? no   BP (!) 160/79 (BP Location: Right Arm, Patient Position: Sitting)   Pulse 60   Temp 98.2 F (36.8 C) (Oral)   Resp 17   Ht 5' 5"  (1.651 m)   Wt 148 lb 6 oz (67.3 kg)   SpO2 100%   BMI 24.69 kg/m   Wt Readings from Last 3 Encounters:  09/08/19 148 lb 6 oz (67.3 kg)  09/05/19 150 lb 9.6 oz (68.3 kg)  08/18/19 152 lb 8 oz (69.2 kg)     wt

## 2019-09-07 NOTE — Progress Notes (Addendum)
Radiation Oncology         [phone] ________________________________  Initial Outpatient Consultation  Name: [name] Richards MRN: [id number]  Date: 09/08/2019  DOB: [birth year]  CC:Harlan Stains, MD  Harlan Stains, MD   REFERRING PHYSICIAN: Harlan Stains, MD  DIAGNOSIS: The primary encounter diagnosis was VAIN III (vaginal intraepithelial neoplasia grade III). A diagnosis of Vulvar cancer ([place]) was also pertinent to this visit.  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 female who is seen as a courtesy of Dr. Denman George for an opinion concerning radiation therapy as part of management for her recurrent invasive vulvar cancer. Today, she is accompanied by good friend Caswell Corwin. Initially, the patient presented with a poorly differentiated endometrial carcinoma and endocervical adenocarcinoma in February of 1997. She underwent a total abdominal hysterectomy and bilateral salpingo-oophorectomy, and pelvic and para-aortic lymphadenectomy. She received post-operative whole pelvis radiation therapy and was followed by Dr. Fermin Schwab for observation. 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 She also has a history of lichen sclerosis of the vulva that has been stable.  On 01/15/2015, the patient met with Dr. Denman George after developing recurrence of symptoms and vulvar irritation. She was found to have a new vulvar mass at the posterior left introitus that was concerning for invasive vulvar cancer. Vulvar biopsy on that day revealed invasive squamous cell carcinoma.  PET scan on  01/30/2015 showed an approximate 2.7 cm hypermetabolic focus in the region of the posterior vulva and urethral meatus. It was unclear whether or not it represented the patient's primary site of disease or urine contamination. There was also noted to be an enlarging sub solid nodule within the left upper lobe that exhibited mild, borderline malignant range FDG uptake and was suspicious for low-grade indolent pulmonary neoplasm such as adenocarcinoma. There were no findings to suggest metastatic disease from the diagnosed vulvar carcinoma and there was no focal hypermetabolic activity to suggest skeletal metastasis.  The patient underwent a modified radical vulvectomy on 02/06/2015 at [facility] that was performed by Dr. Fermin Schwab. She was found to have a 3 cm vulvar lesion of the posterior vulva that extended into the posterior vagina. Final pathology showed some close surgical margins. However, the specimen margins were widely around the primary cancer.  The patient began seeing Dr. Lamonte Sakai on 07/10/2015 for enlarging pulmonary nodule seen on the above PET scan, first noted on CT scan in 2013. They discussed serial scans versus biopsy versus bronchoscopy versus surgical resection. Dr. Lamonte Sakai recommended surgical resection and referred the patient to thoracic surgery.   CT scan of chest on 07/12/2015 showed that the solid component of the large part solid nodule in the left upper lobe measured 9 x 3 mm with a mean diameter of 6 mm. The persistent part solid nodules with solid components were greater than or equal to 6 mm and were considered highly suspicious for pulmonary adenocarcinoma. The solid nodule within the right lower lobe was stable.  The patient underwent a left video-assisted thoracoscopy, wedge resection, thoracoscopic lingular sparing left upper lobectomy, and lymph node dissection on 08/01/2015 that was performed by Dr. Roxan Hockey. Pathology from the procedure  revealed well-differentiated  adenocarcinoma that spanned 2.5 cm. The surgical resection margins were negative for carcinoma. Seven lymph nodes were dissected and biopsied, all of which were negative for carcinoma.  The patient was referred to Dr. Julien Nordmann for further evaluation of the lung cancer and was seen in consultation on 09/27/2015. At that time, they discussed how there was no survival benefit for adjuvant systemic chemotherapy or radiation to a patient with a stage IA after surgical resection and that the standard of care would be observation and close monitoring.  Restaging chest CT scan on 03/20/2016 showed interval lingular sparing left upper lobectomy. There was a mildly irregular band-like opacity along the suture line in the medial left upper lobe that was most consistent with post-surgical scarring and warranted continued attention on follow-up chest CT. There was no evidence of metastatic disease in the chest.  The patient underwent a small modified radical vulvectomy of a new lesion of the right introitus on 11/17/2016 that was performed by Dr. Fermin Schwab. Pathology revealed invasive squamous cell carcinoma. Depth of invasion was 4 mm and all margins were negative.  PET scan and repeat chest CT scans from 2018 - 2020 showed stable post-surgical changes without evidence of localized recurrence or metastatic disease. She remained under observation of gynecologic oncology and medical oncology.  The patient was doing well until 07/20/2019 when she had followed up with Dr. Denman George regarding a couple episodes of vaginal spotting. On examination, there was noted to be a verrucous lesion measuring approximately 2 cm on the anterior wall of the suburethral vagina that extended to the introitus and wrapped around to the right mid vagina. A distal anterior vaginal biopsy was performed at that time and revealed VAIN III high-grade vaginal intraepithelial neoplasia. The stroma was inflamed and focally the base of the epithelium  was irregular and focal, so superficial stromal invasion could not be ruled out. Given those findings, Dr. Denman George recommended an excision with partial anterior distal vaginectomy.  Given intraoperative findings significant for a fore-shortened 3 cm vagina with friable 4 cm plaque on the anterior vagina that extended to the right fornix and left posterior vaginal wall, the patient ended up undergoing a total vaginectomy on 08/11/2019 that was performed by Dr. Denman George. All gross visible disease was removed. Pathology from the procedure revealed high-grade squamous intraepithelial lesion (VaIN3, high-grade dysplasia) with foci suspicious for at least superficial invasion. The suspicious foci were present at the edges of tissue fragments.  The patient's case was discussed at the gynecologic oncology multi-disciplinary conference on 08/22/2019, during which time it was recommended that she proceed with a PET scan followed by radiation therapy. The consensus was made that it was an invasive cancer with tumor abutting the deep margins and therefore, there was microscopic residual disease remaining.  The patient was last seen by Dr. Denman George on 09/05/2019. At that time, they discussed adjuvant radiation to the vulva/vagina with curative intent. The patient left that visit questioning whether or not she wanted to proceed with radiation because she has trips to [place] scheduled in the near future and is concerned about the associated toxicities.  PREVIOUS RADIATION THERAPY: Yes. The patient has a history of poorly differentiated endometrial carcinoma and endocervical adenocarcinoma in February of 1997. She underwent a total abdominal hysterectomy and bilateral salpingo-oophorectomy, and pelvic and para-aortic lymphadenectomy. She received post-operative whole pelvis radiation therapy.  PAST MEDICAL HISTORY:  Past Medical History:  Diagnosis Date  . 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  Asthma    . Cervical cancer ([place]) 1997   poorly differentiated cervical carcinoma. S/p TAH and radiation x6 wks   . Complication of anesthesia    took 2 days to wake up after 1 surgery at 20, pt can't use a adult cath,needs peds. cath due to past injury  . Constipation    very worried about this  . Endometrial cancer ([place]) 1997   coincidental endometrial cancer also  . Hyperlipemia   . Hypertension   . Hypothyroidism   . Primary cancer of left upper lobe of lung ([place]) 08/01/2015  . Small bowel obstruction ([place])    12/2008, repeat SBO in 02/2011 also with terminal ilietis   . Thyroid disease   . Vertebral body hemangioma    T12    PAST SURGICAL HISTORY: Past Surgical History:  Procedure Laterality Date  . ABDOMINAL HYSTERECTOMY  Feb 1997   TAH/BSO, pelvic and periaortic lymphadenectomy   . BACK SURGERY    . EYE SURGERY     Lasik   . LYMPH NODE DISSECTION Left 08/01/2015   Procedure: LYMPH NODE DISSECTION;  Surgeon: Melrose Nakayama, MD;  Location: [facility];  Service: Thoracic;  Laterality: Left;  . PARTIAL NEPHRECTOMY     right  . SEGMENTECOMY Left 08/01/2015   Procedure: Left Upper Lobe SEGMENTECTOMY;  Surgeon: Melrose Nakayama, MD;  Location: [facility];  Service: Thoracic;  Laterality: Left;  . SKIN BIOPSY    . TUBAL LIGATION    . TUMOR REMOVAL     meningioma  T 12  . VAGINECTOMY, PARTIAL N/A 08/11/2019   Procedure: TOTAL VAGINECTOMY;  Surgeon: Everitt Amber, MD;  Location: [facility];  Service: Gynecology;  Laterality: N/A;  . VIDEO ASSISTED THORACOSCOPY (VATS)/WEDGE RESECTION Left 08/01/2015   Procedure: VIDEO ASSISTED THORACOSCOPY (VATS)/WEDGE RESECTION;  Surgeon: Melrose Nakayama, MD;  Location: [place];  Service: Thoracic;  Laterality: Left;  Marland Kitchen VULVA SURGERY     x 2, 1 at [facility] (2016), and [facility]     FAMILY HISTORY:  Family History  Problem Relation Age of Onset  . Hypertension Other   . Diabetes Other   . Stroke Other   . CAD Other     SOCIAL HISTORY:  Social  History   Tobacco Use  . Smoking status: Never Smoker  . Smokeless tobacco: Never Used  Vaping Use  . Vaping Use: Never used  Substance Use Topics  . Alcohol use: No  . Drug use: No    ALLERGIES:  Allergies  Allergen Reactions  . Penicillins Hives, Rash and Other (See Comments)    Has patient had a PCN reaction causing immediate rash, facial/tongue/throat swelling, SOB or lightheadedness with hypotension: NO Has patient had a PCN reaction causing SEVERE RASH INVOLVING MUCUS MEMBRANES OR SKIN NECROSIS: YES Has patient had a PCN reaction that REQUIRED HOSPITALIZATION : patient denies hospitalization Has patient had a PCN reaction occurring within the last 10 years: NO    . Percocet [Oxycodone-Acetaminophen] Other (See Comments)    Made pt feel real funny, never wanting to take it again; near syncopal episode  . Prochlorperazine Other (See Comments)    extrapyramidal reaction Jaws locked    MEDICATIONS:  Current Outpatient Medications  Medication Sig Dispense Refill  . amLODipine (NORVASC) 2.5 MG tablet Take 2.5 mg by mouth daily.     . benazepril-hydrochlorthiazide (LOTENSIN HCT) 20-25 MG tablet Take 1 tablet by mouth daily.     Marland Kitchen BIOTIN PO Take 1 tablet by mouth  4 (four) times a week.    . Cholecalciferol (VITAMIN D3 PO) Take 1 tablet by mouth in the morning and at bedtime.    . docusate sodium (COLACE) 100 MG capsule Take 100 mg by mouth 2 (two) times daily.    Marland Kitchen estradiol (ESTRACE VAGINAL) 0.1 MG/GM vaginal cream Place 1 Applicatorful vaginally daily. 42.5 g 2  . Fexofenadine HCl (ALLEGRA PO) Take by mouth daily.    Marland Kitchen levothyroxine (SYNTHROID, LEVOTHROID) 75 MCG tablet Take 75 mcg by mouth daily.    Marland Kitchen MAGNESIUM PO Take 1 tablet by mouth daily.    . Multiple Vitamin (MULTIVITAMIN WITH MINERALS) TABS tablet Take 1 tablet by mouth daily.    . Omega-3 Fatty Acids (FISH OIL PO) Take 1 capsule by mouth in the morning and at bedtime.    . polyethylene glycol (MIRALAX / GLYCOLAX)  packet Take 11.3333 g by mouth at bedtime. 2/3 capful    . simvastatin (ZOCOR) 20 MG tablet Take 20 mg by mouth at bedtime.      No current facility-administered medications for this encounter.    REVIEW OF SYSTEMS:  A 10+ POINT REVIEW OF SYSTEMS WAS OBTAINED including neurology, dermatology, psychiatry, cardiac, respiratory, lymph, extremities, GI, GU, musculoskeletal, constitutional, reproductive, HEENT.  She denies any vaginal discharge or bleeding since her surgery.  She denies any significant pain in the lower pelvis region.  She denies any difficulties with urination or bowels   PHYSICAL EXAM:  height is 5' 5"  (1.651 m) and weight is 148 lb 6 oz (67.3 kg). Her oral temperature is 98.2 F (36.8 C). Her blood pressure is 160/79 (abnormal) and her pulse is 60. Her respiration is 17 and oxygen saturation is 100%.   General: Alert and oriented, in no acute distress HEENT: Head is normocephalic. Extraocular movements are intact.  Neck: Neck is supple, no palpable cervical or supraclavicular lymphadenopathy. Heart: Regular in rate and rhythm with no murmurs, rubs, or gallops. Chest: Clear to auscultation bilaterally, with no rhonchi, wheezes, or rales. Abdomen: Soft, nontender, nondistended, with no rigidity or guarding. Extremities: No cyanosis or edema. Lymphatics: see Neck Exam Skin: No concerning lesions. Musculoskeletal: symmetric strength and muscle tone throughout. Neurologic: Cranial nerves II through XII are grossly intact. No obvious focalities. Speech is fluent. Coordination is intact. Psychiatric: Judgment and insight are intact. Affect is appropriate. Pelvic exam reveals surgical changes along the external genitalia.  Examination of the vaginal region reveals sutures in place, foreshortened vaginal vault estimated to be approximately 2 cm in length. No signs of infection.  ECOG = 1  0 - Asymptomatic (Fully active, able to carry on all predisease activities without  restriction)  1 - Symptomatic but completely ambulatory (Restricted in physically strenuous activity but ambulatory and able to carry out work of a light or sedentary nature. For example, light housework, office work)  2 - Symptomatic, <50% in bed during the day (Ambulatory and capable of all self care but unable to carry out any work activities. Up and about more than 50% of waking hours)  3 - Symptomatic, >50% in bed, but not bedbound (Capable of only limited self-care, confined to bed or chair 50% or more of waking hours)  4 - Bedbound (Completely disabled. Cannot carry on any self-care. Totally confined to bed or chair)  5 - Death   Eustace Pen MM, Creech RH, Tormey DC, et al. [phone]). "Toxicity and response criteria of the [facility]". [place] Oncol. 5 (6): 649-55  LABORATORY DATA:  Lab Results  Component Value Date   WBC 4.7 08/04/2019   HGB 12.6 08/04/2019   HCT 40.1 08/04/2019   MCV 69.5 (L) 08/04/2019   PLT 227 08/04/2019   NEUTROABS 2.7 09/13/2018   Lab Results  Component Value Date   NA 132 (L) 08/04/2019   K 3.6 08/04/2019   CL 95 (L) 08/04/2019   CO2 28 08/04/2019   GLUCOSE 112 (H) 08/04/2019   CREATININE 0.67 08/04/2019   CALCIUM 9.0 08/04/2019      RADIOGRAPHY: No results found.    IMPRESSION: Recurrent invasive vulvar cancer (VAIN III)  the patient ended up undergoing a total vaginectomy on 08/11/2019 that was performed by Dr. Denman George. All gross visible disease was removed. Pathology from the procedure revealed high-grade squamous intraepithelial lesion (VaIN3, high-grade dysplasia) with foci suspicious for at least superficial invasion. The suspicious foci were present at the edges of tissue fragments.  The patient's case was discussed at the gynecologic oncology multi-disciplinary conference on 08/22/2019, during which time it was recommended that she proceed with a PET scan followed by radiation therapy.  PET scan from August 3 revealed  no evidence of local regional recurrence or distant metastasis. There was activity noted in the remaining vaginal vault which was felt to be consistent with surgical changes. the consensus from Gyn/Onc  conference was made that it was an invasive cancer with tumor abutting the deep margins and therefore, there was microscopic residual disease remaining with significant risk for local recurrence.  On pelvic exam today the patient is noted to have a residual vaginal vault estimated to be approximately 2 to 2-1/2 cm in length.  This residual vault would appear to accommodate a 2 cm diameter cylinder.  With our baseplate device and ability to angle cylinder,  would be able to place a vaginal cylinder at the correct angle approximating the surgical bed to deliver radiation to the most likely site for recurrence.  I discussed this today with the patient and friend Caswell Corwin.  The other option would be to consider external beam to the lower pelvic region.  Patient is somewhat hesitant in considering this approach given her previous history of complications related to her pelvic radiation therapy.  Vaginal vault radiation therapy would result in likely significant urinary symptoms since the urethral orifice would be close proximity to her radiation therapy.  With significant swelling the patient may potentially require Foley catheter during her radiation therapy or immediately afterward. She appears to understand this issue well.  The patient also may have some effects along the distal rectum region from her radiation therapy.   PLAN: Patient will be scheduled to begin her vaginal brachytherapy approximately 6 weeks postop assuming she has continued good healing..  Anticipate 5 high-dose-rate treatments directed at the surgical bed.  We will likely prescribe to a depth of approximately 5 mm given the irregularities noted along the surgical bed on exam today.  Total time spent in this encounter was 65 minutes  which included reviewing the patient's extensive gynecologic oncology history, pulmonary history, medical oncology history, surgeries, biopsies, pathology reports, PET scans, CT scans, consultations, follow-ups, physical examination, and documentation.    ------------------------------------------------  Blair Promise, PhD, MD  This document serves as a record of services personally performed by Gery Pray, MD. It was created on his behalf by Clerance Lav, a trained medical scribe. The creation of this record is based on the scribe's personal observations and the provider's statements to them. This document has been checked  and approved by the attending provider.

## 2019-09-08 ENCOUNTER — Encounter: Payer: Medicare Other | Admitting: Genetic Counselor

## 2019-09-08 ENCOUNTER — Ambulatory Visit
Admission: RE | Admit: 2019-09-08 | Discharge: 2019-09-08 | Disposition: A | Discharge status: Home / Self Care | Payer: Medicare Other | Source: Ambulatory Visit | Attending: Radiation Oncology | Admitting: Radiation Oncology

## 2019-09-08 ENCOUNTER — Other Ambulatory Visit: Payer: Self-pay

## 2019-09-08 ENCOUNTER — Encounter: Payer: Self-pay | Admitting: Radiation Oncology

## 2019-09-08 ENCOUNTER — Other Ambulatory Visit: Payer: Medicare Other

## 2019-09-08 VITALS — BP 160/79 | HR 60 | Temp 98.2°F | Resp 17 | Ht 65.0 in | Wt 148.4 lb

## 2019-09-08 DIAGNOSIS — J45909 Unspecified asthma, uncomplicated: Secondary | ICD-10-CM | POA: Diagnosis not present

## 2019-09-08 DIAGNOSIS — E039 Hypothyroidism, unspecified: Secondary | ICD-10-CM | POA: Insufficient documentation

## 2019-09-08 DIAGNOSIS — Z86018 Personal history of other benign neoplasm: Secondary | ICD-10-CM | POA: Diagnosis not present

## 2019-09-08 DIAGNOSIS — Z90710 Acquired absence of both cervix and uterus: Secondary | ICD-10-CM | POA: Diagnosis not present

## 2019-09-08 DIAGNOSIS — I10 Essential (primary) hypertension: Secondary | ICD-10-CM | POA: Diagnosis not present

## 2019-09-08 DIAGNOSIS — K5900 Constipation, unspecified: Secondary | ICD-10-CM | POA: Diagnosis not present

## 2019-09-08 DIAGNOSIS — Z79899 Other long term (current) drug therapy: Secondary | ICD-10-CM | POA: Insufficient documentation

## 2019-09-08 DIAGNOSIS — E785 Hyperlipidemia, unspecified: Secondary | ICD-10-CM | POA: Insufficient documentation

## 2019-09-08 DIAGNOSIS — C519 Malignant neoplasm of vulva, unspecified: Secondary | ICD-10-CM | POA: Diagnosis present

## 2019-09-08 DIAGNOSIS — Z8541 Personal history of malignant neoplasm of cervix uteri: Secondary | ICD-10-CM | POA: Insufficient documentation

## 2019-09-08 DIAGNOSIS — Z8542 Personal history of malignant neoplasm of other parts of uterus: Secondary | ICD-10-CM | POA: Diagnosis not present

## 2019-09-08 DIAGNOSIS — Z90722 Acquired absence of ovaries, bilateral: Secondary | ICD-10-CM | POA: Diagnosis not present

## 2019-09-08 DIAGNOSIS — D072 Carcinoma in situ of vagina: Secondary | ICD-10-CM

## 2019-09-12 ENCOUNTER — Ambulatory Visit (HOSPITAL_COMMUNITY): Payer: Medicare Other

## 2019-09-12 ENCOUNTER — Telehealth: Payer: Self-pay | Admitting: *Deleted

## 2019-09-12 ENCOUNTER — Ambulatory Visit: Payer: Medicare Other | Admitting: Internal Medicine

## 2019-09-12 ENCOUNTER — Other Ambulatory Visit: Payer: Medicare Other

## 2019-09-12 ENCOUNTER — Telehealth: Payer: Self-pay | Admitting: Oncology

## 2019-09-12 NOTE — Telephone Encounter (Signed)
Left a message advising that we will call [name] Richards with her PET scan results from tomorrow when available.  Requested a return call if she has any questions.

## 2019-09-12 NOTE — Telephone Encounter (Signed)
[place] Psychosocial Distress Screening Clinical Social Work  Clinical Social Work was referred by distress screening protocol.  The patient scored a 7 on the Psychosocial Distress Thermometer which indicates moderate distress. Clinical Social Worker contacted patient by phone to assess for distress and other psychosocial needs.   Ms. Nehring reports no concerns at this time, other than anxious to complete PET scan and receive results.  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 Patient is active and enjoys yoga.  She has no questions at this time, but agreed to call CSW as questions/needs arise.  ONCBCN DISTRESS SCREENING 09/08/2019  Distress experienced in past week (1-10) 7    Clinical Social Worker follow up needed: No.  If yes, follow up plan:  Gwinda Maine, LCSW

## 2019-09-13 ENCOUNTER — Other Ambulatory Visit: Payer: Self-pay

## 2019-09-13 ENCOUNTER — Encounter (HOSPITAL_COMMUNITY)
Admission: RE | Admit: 2019-09-13 | Discharge: 2019-09-13 | Disposition: A | Discharge status: Home / Self Care | Payer: Medicare Other | Source: Ambulatory Visit | Attending: Gynecologic Oncology | Admitting: Gynecologic Oncology

## 2019-09-13 DIAGNOSIS — I700 Atherosclerosis of aorta: Secondary | ICD-10-CM | POA: Insufficient documentation

## 2019-09-13 DIAGNOSIS — C519 Malignant neoplasm of vulva, unspecified: Secondary | ICD-10-CM | POA: Diagnosis not present

## 2019-09-13 DIAGNOSIS — I2510 Atherosclerotic heart disease of native coronary artery without angina pectoris: Secondary | ICD-10-CM | POA: Diagnosis not present

## 2019-09-13 LAB — GLUCOSE, CAPILLARY: Glucose-Capillary: 110 mg/dL — ABNORMAL HIGH (ref 70–99)

## 2019-09-13 MED ORDER — FLUDEOXYGLUCOSE F - 18 (FDG) INJECTION
7.3700 | Freq: Once | INTRAVENOUS | Status: AC | PRN
  Administered 2019-09-13: 7.37 via INTRAVENOUS

## 2019-09-14 ENCOUNTER — Telehealth: Payer: Self-pay | Admitting: *Deleted

## 2019-09-14 ENCOUNTER — Telehealth: Payer: Self-pay | Admitting: Oncology

## 2019-09-14 NOTE — Telephone Encounter (Signed)
RETURNED PATIENT'S PHONE CALL, SPOKE WITH PATIENT. ?

## 2019-09-14 NOTE — Telephone Encounter (Signed)
Discussed PET scan results with Charlett Nose.  She verbalized understanding and agreement.

## 2019-09-14 NOTE — Telephone Encounter (Signed)
Left a message to review PET scan results.  Requested a return call.

## 2019-09-21 ENCOUNTER — Telehealth: Payer: Self-pay | Admitting: *Deleted

## 2019-09-21 NOTE — Telephone Encounter (Signed)
CALLED PATIENT TO INFORM OF NEW HDR [facility], LVM FOR A RETURN CALL

## 2019-09-22 ENCOUNTER — Other Ambulatory Visit (HOSPITAL_COMMUNITY): Payer: Medicare Other

## 2019-09-28 ENCOUNTER — Telehealth: Payer: Self-pay | Admitting: *Deleted

## 2019-09-28 NOTE — Progress Notes (Signed)
Radiation Oncology         [phone] ________________________________  Name: [name] Richards MRN: [id number]  Date: 09/29/2019  DOB: 01/18/1946  Vaginal Brachytherapy Procedure Note  CC: Harlan Stains, MD Harlan Stains, MD    ICD-10-CM   1. VAIN III (vaginal intraepithelial neoplasia grade III)  D07.2     Diagnosis: Recurrent invasive vulvar cancer (VAIN III)    Narrative: She returns today for vaginal cylinder fitting. She was seen in consultation on 09/08/2019, during which time we discussed beginning vaginal brachytherapy approximately six week post-op, assuming she had continued healing. Since consultation, she underwent a PET scan on 09/13/2019 that did not show any specific evidence of recurrent vulvar carcinoma or distant metastases. There was some non-specific activity in the region of the vaginal introitus, which could have reflected urine contamination or vaginal recurrence. There was no abnormal thoracic activity status post left upper lobe wedge resection.  On review of systems, she reports no complaints. She denies vaginal bleeding.  ALLERGIES: is allergic to penicillins, percocet [oxycodone-acetaminophen], and prochlorperazine.  Meds: Current Outpatient Medications  Medication Sig Dispense Refill   amLODipine (NORVASC) 2.5 MG tablet Take 2.5 mg by mouth daily.      benazepril-hydrochlorthiazide (LOTENSIN HCT) 20-25 MG tablet Take 1 tablet by mouth daily.      BIOTIN PO Take 1 tablet by mouth 4 (four) times a week.     Cholecalciferol (VITAMIN D3 PO) Take 1 tablet by mouth in the morning and at bedtime.     docusate sodium (COLACE) 100 MG capsule Take 100 mg by mouth 2 (two) times daily.     Fexofenadine HCl (ALLEGRA PO) Take by mouth daily.     levothyroxine (SYNTHROID, LEVOTHROID) 75 MCG tablet Take 75 mcg by mouth daily.     MAGNESIUM PO Take 1 tablet by mouth daily.     Multiple Vitamin (MULTIVITAMIN WITH MINERALS) TABS tablet Take 1 tablet by mouth  daily.     Omega-3 Fatty Acids (FISH OIL PO) Take 1 capsule by mouth in the morning and at bedtime.     polyethylene glycol (MIRALAX / GLYCOLAX) packet Take 11.3333 g by mouth at bedtime. 2/3 capful     simvastatin (ZOCOR) 20 MG tablet Take 20 mg by mouth at bedtime.      No current facility-administered medications for this encounter.    Physical Findings: The patient is in no acute distress. Patient is alert and oriented.  height is 5\' 5"  (1.651 m) and weight is 150 lb 4 oz (68.2 kg). Her oral temperature is 99.1 F (37.3 C). Her blood pressure is 162/88 (abnormal) and her pulse is 67. Her respiration is 16 and oxygen saturation is 99%.   No palpable cervical, supraclavicular or axillary lymphoadenopathy. The heart has a regular rate and rhythm. The lungs are clear to auscultation. Abdomen soft and non-tender.  On pelvic examination the external genitalia were unremarkable previous surgical changes noted. A speculum exam was performed.  Vaginal vault healed well at this time, no sutures visible. no mucosal lesions. On bimanual exam there were no pelvic masses appreciated.  Lab Findings: Lab Results  Component Value Date   WBC 4.7 08/04/2019   HGB 12.6 08/04/2019   HCT 40.1 08/04/2019   MCV 69.5 (L) 08/04/2019   PLT 227 08/04/2019    Radiographic Findings: NM PET Image Restag (PS) Skull Base To Thigh  Result Date: 09/13/2019 CLINICAL DATA:  Subsequent treatment strategy for recurrent invasive vulvar cancer. History of segmental left upper  lobe resection for adenocarcinoma in 2017. EXAM: NUCLEAR MEDICINE PET SKULL BASE TO THIGH TECHNIQUE: 7.37 mCi F-18 FDG was injected intravenously. Full-ring PET imaging was performed from the skull base to thigh after the radiotracer. CT data was obtained and used for attenuation correction and anatomic localization. Fasting blood glucose: 110 mg/dl COMPARISON:  Chest CT 09/13/2018 and 09/14/2017.  PET-CT 03/23/2017. FINDINGS: Mediastinal blood pool  activity: SUV max 2.8 NECK: No hypermetabolic cervical lymph nodes are identified.There are no lesions of the pharyngeal mucosal space. Incidental CT findings: Bilateral carotid atherosclerosis. CHEST: There are no hypermetabolic mediastinal, hilar or axillary lymph nodes. No hypermetabolic pulmonary activity or suspicious pulmonary nodularity. Incidental CT findings: Stable post surgical changes in the left upper lobe and mild associated parenchymal scarring. Atherosclerosis of the aorta, great vessels and coronary arteries. ABDOMEN/PELVIS: There is no hypermetabolic activity within the liver, adrenal glands, spleen or pancreas. There is no hypermetabolic nodal activity. There is nonspecific hypermetabolic activity near the vaginal introitus (SUV max 9.2). This could easily relate to urine contamination in this location but clinical correlation necessary to exclude tumor recurrence in this area. No other abnormal perineal activity. Incidental CT findings: Stable scarring in the upper pole of the right kidney. No hydronephrosis. Diffuse aortic and branch vessel atherosclerosis. There are multiple lymphadenectomy clips throughout the retroperitoneum and iliac vessels bilaterally. Hysterectomy without evidence of pelvic mass. SKELETON: There is no hypermetabolic activity to suggest osseous metastatic disease. Incidental CT findings: Mild degenerative activity in the cervical spine. Chronic spondylosis and degenerative anterolisthesis in the lower lumbar spine. IMPRESSION: 1. No specific evidence of recurrent vulvar carcinoma or distant metastases. There is nonspecific activity in the region of the vaginal introitus which could reflect urine contamination or vaginal recurrence. Correlation with physical examination recommended. 2. No abnormal thoracic activity status post left upper lobe wedge resection. 3. Coronary and Aortic Atherosclerosis (ICD10-I70.0). Electronically Signed   By: Richardean Sale M.D.   On:  09/13/2019 15:42    Impression: Recurrent invasive vulvar cancer (VAIN III)  Patient was fitted for a vaginal cylinder. The patient will be treated with a 2.0 cm diameter cylinder with a treatment length of 2.5 cm. This distended the vaginal vault without undue discomfort. The patient tolerated the procedure well.  The patient was successfully fitted for a vaginal cylinder. The patient is appropriate to begin vaginal brachytherapy.   Plan: The patient will proceed with CT simulation and vaginal brachytherapy today.    _______________________________   Blair Promise, PhD, MD  This document serves as a record of services personally performed by Gery Pray, MD. It was created on his behalf by Clerance Lav, a trained medical scribe. The creation of this record is based on the scribe's personal observations and the provider's statements to them. This document has been checked and approved by the attending provider.

## 2019-09-28 NOTE — Progress Notes (Signed)
°  Radiation Oncology         [phone] ________________________________  Name: [name] Richards MRN: [id number]  Date: 09/29/2019  DOB: February 10, 1946  SIMULATION AND TREATMENT PLANNING NOTE HDR BRACHYTHERAPY  DIAGNOSIS: Recurrent invasive vulvar cancer   NARRATIVE:  The patient was brought to the [place] suite.  Identity was confirmed.  All relevant records and images related to the planned course of therapy were reviewed.  The patient freely provided informed written consent to proceed with treatment after reviewing the details related to the planned course of therapy. The consent form was witnessed and verified by the simulation staff.  Then, the patient was set-up in a stable reproducible  supine position for radiation therapy.  CT images were obtained.  Surface markings were placed.  The CT images were loaded into the planning software.  Then the target and avoidance structures were contoured.  Treatment planning then occurred.  The radiation prescription was entered and confirmed.   I have requested : Brachytherapy Isodose Plan and Dosimetry Calculations to plan the radiation distribution.    PLAN:  The patient will receive 30 Gy in 5 fractions.  The patient will be treated with a 2 cm diameter cylinder with a treatment length of 2.5 cm.  Prescription will be to the mucosal surface.  Iridium 192 will be the high-dose-rate source.  ________________________________   Blair Promise, PhD, MD  This document serves as a record of services personally performed by Gery Pray, MD. It was created on his behalf by Clerance Lav, a trained medical scribe. The creation of this record is based on the scribe's personal observations and the provider's statements to them. This document has been checked and approved by the attending provider.

## 2019-09-28 NOTE — Telephone Encounter (Signed)
CALLED PATIENT TO REMIND OF NEW HDR [facility] FOR 09-29-19, SPOKE WITH PATIENT AND SHE IS AWARE OF THESE APPTS.

## 2019-09-28 NOTE — Progress Notes (Signed)
  Radiation Oncology         [phone] ________________________________  Name: [name] Richards MRN: [id number]  Date: 09/29/2019  DOB: October 30, 1945  CC: Harlan Stains, MD  Harlan Stains, MD  HDR BRACHYTHERAPY NOTE  DIAGNOSIS: Recurrent invasive vulvar cancer (VAIN III)   Simple treatment device note: Patient had construction of her custom vaginal cylinder. She will be treated with a 2.0 cm diameter segmented cylinder. This conforms to her anatomy without undue discomfort.  Vaginal brachytherapy procedure node: The patient was brought to the [place] suite. Identity was confirmed. All relevant records and images related to the planned course of therapy were reviewed. The patient freely provided informed written consent to proceed with treatment after reviewing the details related to the planned course of therapy. The consent form was witnessed and verified by the simulation staff. Then, the patient was set-up in a stable reproducible supine position for radiation therapy. Pelvic exam revealed the vaginal cuff to be intact . The patient's custom vaginal cylinder was placed in the proximal vagina. This was affixed to the CT/MR stabilization plate to prevent slippage. Patient tolerated the placement well.  Verification simulation note:  A fiducial marker was placed within the vaginal cylinder. An AP and lateral film was then obtained through the pelvis area. This documented accurate position of the vaginal cylinder for treatment.  HDR BRACHYTHERAPY TREATMENT  The remote afterloading device was affixed to the vaginal cylinder by catheter. Patient then proceeded to undergo her first high-dose-rate treatment directed at the proximal vagina. The patient was prescribed a dose of 6.0 gray to be delivered to the mucosal surface. Treatment length was 2.5 cm. Patient was treated with 1 channel using 6 dwell positions. Treatment time was 134.9 seconds. Iridium 192 was the high-dose-rate source for treatment. The  patient tolerated the treatment well. After completion of her therapy, a radiation survey was performed documenting return of the iridium source into the GammaMed safe.   PLAN: The patient will return in five days for her second high-dose-rate treatment. ________________________________    Blair Promise, PhD, MD  This document serves as a record of services personally performed by Gery Pray, MD. It was created on his behalf by Clerance Lav, a trained medical scribe. The creation of this record is based on the scribe's personal observations and the provider's statements to them. This document has been checked and approved by the attending provider.

## 2019-09-29 ENCOUNTER — Ambulatory Visit
Admission: RE | Admit: 2019-09-29 | Discharge: 2019-09-29 | Disposition: A | Discharge status: Home / Self Care | Payer: Medicare Other | Source: Ambulatory Visit | Attending: Radiation Oncology | Admitting: Radiation Oncology

## 2019-09-29 ENCOUNTER — Encounter: Payer: Self-pay | Admitting: Radiation Oncology

## 2019-09-29 ENCOUNTER — Other Ambulatory Visit: Payer: Self-pay

## 2019-09-29 VITALS — BP 162/88 | HR 67 | Temp 99.1°F | Resp 16 | Ht 65.0 in | Wt 150.2 lb

## 2019-09-29 DIAGNOSIS — C519 Malignant neoplasm of vulva, unspecified: Secondary | ICD-10-CM

## 2019-09-29 DIAGNOSIS — D072 Carcinoma in situ of vagina: Secondary | ICD-10-CM

## 2019-09-29 NOTE — Progress Notes (Signed)
Patient here for a visit with Dr. Sondra Come and for a HDR [place].Denies bleding pain or sother sx.  BP (!) 162/88 (BP Location: Left Arm, Patient Position: Sitting)   Pulse 67   Temp 99.1 F (37.3 C) (Oral)   Resp 16   Ht 5\' 5"  (1.651 m)   Wt 150 lb 4 oz (68.2 kg)   SpO2 99%   BMI 25.00 kg/m   Wt Readings from Last 3 Encounters:  09/29/19 150 lb 4 oz (68.2 kg)  09/08/19 148 lb 6 oz (67.3 kg)  09/05/19 150 lb 9.6 oz (68.3 kg)

## 2019-09-29 NOTE — Addendum Note (Signed)
Encounter addended by: Gery Pray, MD on: 09/29/2019 8:28 AM  Actions taken: Clinical Note Signed

## 2019-10-03 ENCOUNTER — Telehealth: Payer: Self-pay | Admitting: *Deleted

## 2019-10-03 NOTE — Telephone Encounter (Signed)
CALLED PATIENT TO REMIND OF HDR [name] Richards 10-04-19 @ 9 AM, LVM FOR A RETURN CALL

## 2019-10-03 NOTE — Progress Notes (Signed)
  Radiation Oncology         [phone] ________________________________  Name: [name] Richards MRN: [id number]  Date: 10/04/2019  DOB: [birth year]  CC: Harlan Stains, MD  Harlan Stains, MD  HDR BRACHYTHERAPY NOTE  DIAGNOSIS: Recurrent invasive vulvar cancer (VAIN III)   Simple treatment device note: Patient had construction of her custom vaginal cylinder. She will be treated with a 2.0 cm diameter segmented cylinder. This conforms to her anatomy without undue discomfort.  Vaginal brachytherapy procedure node: The patient was brought to the [place] suite. Identity was confirmed. All relevant records and images related to the planned course of therapy were reviewed. The patient freely provided informed written consent to proceed with treatment after reviewing the details related to the planned course of therapy. The consent form was witnessed and verified by the simulation staff. Then, the patient was set-up in a stable reproducible supine position for radiation therapy. Pelvic exam revealed the vaginal cuff to be intact . The patient's custom vaginal cylinder was placed in the proximal vagina. This was affixed to the CT/MR stabilization plate to prevent slippage. Patient tolerated the placement well.  Verification simulation note:  A fiducial marker was placed within the vaginal cylinder. An AP and lateral film was then obtained through the pelvis area. This documented accurate position of the vaginal cylinder for treatment.  HDR BRACHYTHERAPY TREATMENT  The remote afterloading device was affixed to the vaginal cylinder by catheter. Patient then proceeded to undergo her second high-dose-rate treatment directed at the proximal vagina. The patient was prescribed a dose of 6.0 gray to be delivered to the mucosal surface. Treatment length was 2.5 cm. Patient was treated with 1 channel using 6 dwell positions. Treatment time was 141.3 seconds. Iridium 192 was the high-dose-rate source for treatment.  The patient tolerated the treatment well. After completion of her therapy, a radiation survey was performed documenting return of the iridium source into the GammaMed safe.   PLAN: The patient will return in three days for her third high-dose-rate treatment. ________________________________    Blair Promise, PhD, MD  This document serves as a record of services personally performed by Gery Pray, MD. It was created on his behalf by Clerance Lav, a trained medical scribe. The creation of this record is based on the scribe's personal observations and the provider's statements to them. This document has been checked and approved by the attending provider.

## 2019-10-04 ENCOUNTER — Telehealth: Payer: Self-pay | Admitting: *Deleted

## 2019-10-04 ENCOUNTER — Ambulatory Visit
Admission: RE | Admit: 2019-10-04 | Discharge: 2019-10-04 | Disposition: A | Discharge status: Home / Self Care | Payer: Medicare Other | Source: Ambulatory Visit | Attending: Radiation Oncology | Admitting: Radiation Oncology

## 2019-10-04 ENCOUNTER — Other Ambulatory Visit: Payer: Self-pay

## 2019-10-04 ENCOUNTER — Encounter: Payer: Self-pay | Admitting: Gynecologic Oncology

## 2019-10-04 DIAGNOSIS — D072 Carcinoma in situ of vagina: Secondary | ICD-10-CM

## 2019-10-04 DIAGNOSIS — C519 Malignant neoplasm of vulva, unspecified: Secondary | ICD-10-CM | POA: Diagnosis not present

## 2019-10-04 NOTE — Telephone Encounter (Signed)
Per patient request moved appt from 9/1 to 11/10.

## 2019-10-05 ENCOUNTER — Telehealth: Payer: Self-pay | Admitting: *Deleted

## 2019-10-05 NOTE — Telephone Encounter (Signed)
CALLED PATIENT TO REMIND OF HDR [place] 10-06-19 @ 11 AM, LVM FOR A RETURN CALL

## 2019-10-05 NOTE — Progress Notes (Signed)
°  Radiation Oncology         [phone] ________________________________  Name: [name] Richards MRN: [id number]  Date: 10/06/2019  DOB: [birth year]  CC: Harlan Stains, MD  Harlan Stains, MD  HDR BRACHYTHERAPY NOTE  DIAGNOSIS: Recurrent invasive vulvar cancer (VAIN III)   Simple treatment device note: Patient had construction of her custom vaginal cylinder. She will be treated with a 2.0 cm diameter segmented cylinder. This conforms to her anatomy without undue discomfort.  Vaginal brachytherapy procedure node: The patient was brought to the [place] suite. Identity was confirmed. All relevant records and images related to the planned course of therapy were reviewed. The patient freely provided informed written consent to proceed with treatment after reviewing the details related to the planned course of therapy. The consent form was witnessed and verified by the simulation staff. Then, the patient was set-up in a stable reproducible supine position for radiation therapy. Pelvic exam revealed the vaginal cuff to be intact . The patient's custom vaginal cylinder was placed in the proximal vagina. This was affixed to the CT/MR stabilization plate to prevent slippage. Patient tolerated the placement well.  Verification simulation note:  A fiducial marker was placed within the vaginal cylinder. An AP and lateral film was then obtained through the pelvis area. This documented accurate position of the vaginal cylinder for treatment.  HDR BRACHYTHERAPY TREATMENT  The remote afterloading device was affixed to the vaginal cylinder by catheter. Patient then proceeded to undergo her third high-dose-rate treatment directed at the proximal vagina. The patient was prescribed a dose of 6.0 gray to be delivered to the mucosal surface. Treatment length was 2.5 cm. Patient was treated with 1 channel using 6 dwell positions. Treatment time was 144.0 seconds. Iridium 192 was the high-dose-rate source for treatment. The  patient tolerated the treatment well. After completion of her therapy, a radiation survey was performed documenting return of the iridium source into the GammaMed safe.   PLAN: The patient will return in four days for her fourth high-dose-rate treatment. ________________________________    Blair Promise, PhD, MD  This document serves as a record of services personally performed by Gery Pray, MD. It was created on his behalf by Clerance Lav, a trained medical scribe. The creation of this record is based on the scribe's personal observations and the provider's statements to them. This document has been checked and approved by the attending provider.

## 2019-10-06 ENCOUNTER — Other Ambulatory Visit: Payer: Self-pay

## 2019-10-06 ENCOUNTER — Ambulatory Visit
Admission: RE | Admit: 2019-10-06 | Discharge: 2019-10-06 | Disposition: A | Discharge status: Home / Self Care | Payer: Medicare Other | Source: Ambulatory Visit | Attending: Radiation Oncology | Admitting: Radiation Oncology

## 2019-10-06 DIAGNOSIS — D072 Carcinoma in situ of vagina: Secondary | ICD-10-CM

## 2019-10-06 DIAGNOSIS — C519 Malignant neoplasm of vulva, unspecified: Secondary | ICD-10-CM | POA: Diagnosis not present

## 2019-10-07 ENCOUNTER — Telehealth: Payer: Self-pay | Admitting: *Deleted

## 2019-10-07 NOTE — Telephone Encounter (Signed)
CALLED PATIENT TO REMIND OF HDR [place] 10-10-19 @ 2 PM, LVM FOR A RETURN CALL

## 2019-10-10 ENCOUNTER — Ambulatory Visit
Admission: RE | Admit: 2019-10-10 | Discharge: 2019-10-10 | Disposition: A | Discharge status: Home / Self Care | Payer: Medicare Other | Source: Ambulatory Visit | Attending: Radiation Oncology | Admitting: Radiation Oncology

## 2019-10-10 DIAGNOSIS — C519 Malignant neoplasm of vulva, unspecified: Secondary | ICD-10-CM | POA: Diagnosis not present

## 2019-10-10 DIAGNOSIS — D072 Carcinoma in situ of vagina: Secondary | ICD-10-CM

## 2019-10-10 NOTE — Progress Notes (Signed)
  Radiation Oncology         [phone] ________________________________  Name: [name] Richards MRN: [id number]  Date: 10/12/2019  DOB: Sep 04, 1945  CC: Harlan Stains, MD  Harlan Stains, MD  HDR BRACHYTHERAPY NOTE  DIAGNOSIS: Recurrent invasive vulvar cancer (VAIN III)   Simple treatment device note: Patient had construction of her custom vaginal cylinder. She will be treated with a 2.0 cm diameter segmented cylinder. This conforms to her anatomy without undue discomfort.  Vaginal brachytherapy procedure node: The patient was brought to the [place] suite. Identity was confirmed. All relevant records and images related to the planned course of therapy were reviewed. The patient freely provided informed written consent to proceed with treatment after reviewing the details related to the planned course of therapy. The consent form was witnessed and verified by the simulation staff. Then, the patient was set-up in a stable reproducible supine position for radiation therapy. Pelvic exam revealed the vaginal cuff to be intact . The patient's custom vaginal cylinder was placed in the proximal vagina. This was affixed to the CT/MR stabilization plate to prevent slippage. Patient tolerated the placement well.  Verification simulation note:  A fiducial marker was placed within the vaginal cylinder. An AP and lateral film was then obtained through the pelvis area. This documented accurate position of the vaginal cylinder for treatment.  HDR BRACHYTHERAPY TREATMENT  The remote afterloading device was affixed to the vaginal cylinder by catheter. Patient then proceeded to undergo her fifth high-dose-rate treatment directed at the proximal vagina. The patient was prescribed a dose of 6.0 gray to be delivered to the mucosal surface. Treatment length was 2.5 cm. Patient was treated with 1 channel using 6 dwell positions. Treatment time was 152.2 seconds. Iridium 192 was the high-dose-rate source for treatment. The  patient tolerated the treatment well. After completion of her therapy, a radiation survey was performed documenting return of the iridium source into the GammaMed safe.   PLAN: The patient did experience some diarrhea after her fourth treatment but no other symptoms throughout the course of treatment. Routine follow-up with radiation oncology in one month. ________________________________    Blair Promise, PhD, MD  This document serves as a record of services personally performed by Gery Pray, MD. It was created on his behalf by Clerance Lav, a trained medical scribe. The creation of this record is based on the scribe's personal observations and the provider's statements to them. This document has been checked and approved by the attending provider.

## 2019-10-10 NOTE — Progress Notes (Signed)
°  Radiation Oncology         [phone] ________________________________  Name: [name] Richards MRN: [id number]  Date: 10/10/2019  DOB: 10-19-1945  CC: Harlan Stains, MD  Harlan Stains, MD  HDR BRACHYTHERAPY NOTE  DIAGNOSIS: Recurrent invasive vulvar cancer (VAIN III)   Simple treatment device note: Patient had construction of her custom vaginal cylinder. She will be treated with a 2.0 cm diameter segmented cylinder. This conforms to her anatomy without undue discomfort.  Vaginal brachytherapy procedure node: The patient was brought to the [place] suite. Identity was confirmed. All relevant records and images related to the planned course of therapy were reviewed. The patient freely provided informed written consent to proceed with treatment after reviewing the details related to the planned course of therapy. The consent form was witnessed and verified by the simulation staff. Then, the patient was set-up in a stable reproducible supine position for radiation therapy. Pelvic exam revealed the vaginal cuff to be intact . The patient's custom vaginal cylinder was placed in the proximal vagina. This was affixed to the CT/MR stabilization plate to prevent slippage. Patient tolerated the placement well.  Verification simulation note:  A fiducial marker was placed within the vaginal cylinder. An AP and lateral film was then obtained through the pelvis area. This documented accurate position of the vaginal cylinder for treatment.  HDR BRACHYTHERAPY TREATMENT  The remote afterloading device was affixed to the vaginal cylinder by catheter. Patient then proceeded to undergo her fourth high-dose-rate treatment directed at the proximal vagina. The patient was prescribed a dose of 6.0 gray to be delivered to the mucosal surface. Treatment length was 2.5 cm. Patient was treated with 1 channel using 6 dwell positions. Treatment time was 149.4 seconds. Iridium 192 was the high-dose-rate source for treatment.  The patient tolerated the treatment well. After completion of her therapy, a radiation survey was performed documenting return of the iridium source into the GammaMed safe.   PLAN: The patient will return in two days for her fifth and final high-dose-rate treatment. ________________________________    Blair Promise, PhD, MD  This document serves as a record of services personally performed by Gery Pray, MD. It was created on his behalf by Clerance Lav, a trained medical scribe. The creation of this record is based on the scribe's personal observations and the provider's statements to them. This document has been checked and approved by the attending provider.

## 2019-10-11 ENCOUNTER — Telehealth: Payer: Self-pay | Admitting: *Deleted

## 2019-10-11 NOTE — Telephone Encounter (Signed)
CALLED PATIENT TO REMIND OF HDR [place] 10-12-19 @ 8 AM, SPOKE WITH PATIENT AND SHE IS AWARE OF THIS [place].

## 2019-10-12 ENCOUNTER — Other Ambulatory Visit: Payer: Self-pay

## 2019-10-12 ENCOUNTER — Encounter: Payer: Self-pay | Admitting: Radiation Oncology

## 2019-10-12 ENCOUNTER — Telehealth: Payer: Self-pay | Admitting: *Deleted

## 2019-10-12 ENCOUNTER — Ambulatory Visit
Admission: RE | Admit: 2019-10-12 | Discharge: 2019-10-12 | Disposition: A | Discharge status: Home / Self Care | Payer: Medicare Other | Source: Ambulatory Visit | Attending: Radiation Oncology | Admitting: Radiation Oncology

## 2019-10-12 DIAGNOSIS — C519 Malignant neoplasm of vulva, unspecified: Secondary | ICD-10-CM | POA: Insufficient documentation

## 2019-10-12 DIAGNOSIS — Z923 Personal history of irradiation: Secondary | ICD-10-CM

## 2019-10-12 DIAGNOSIS — D072 Carcinoma in situ of vagina: Secondary | ICD-10-CM

## 2019-10-12 HISTORY — DX: Personal history of irradiation: Z92.3

## 2019-10-12 NOTE — Telephone Encounter (Signed)
CALLED PATIENT TO INFORM OF FU WITH DR. KINARD ON 11-17-19 @ 9:45 AM, SPOKE WITH PATIENT AND SHE IS AWARE OF THIS APPT.

## 2019-10-20 ENCOUNTER — Ambulatory Visit: Payer: Medicare Other | Admitting: Radiation Oncology

## 2019-10-20 ENCOUNTER — Ambulatory Visit: Payer: Medicare Other | Admitting: Gynecologic Oncology

## 2019-10-26 ENCOUNTER — Ambulatory Visit: Payer: Medicare Other | Admitting: Radiation Oncology

## 2019-11-02 NOTE — Progress Notes (Incomplete)
  Patient Name: [name] Richards MRN: [id number] DOB: 1945-04-30 Referring Physician: Harlan Stains (Profile Not Attached) Date of Service: 10/12/2019 [facility], [place]                                                        End Of Treatment Note  Diagnoses: C51.9-Malignant neoplasm of vulva, unspecified  Cancer Staging: Recurrent invasive vulvar cancer (VAIN III)  Intent: Curative  Radiation Treatment Dates: 09/29/2019 through 10/12/2019 Site Technique Total Dose (Gy) Dose per Fx (Gy) Completed Fx Beam Energies  Vagina: Pelvis HDR-brachy 30/30 6 5/5 Ir-192   Narrative: The patient tolerated radiation therapy relatively well. She experienced some diarrhea after her fourth treatment but did not report any other symptoms throughout the course of treatment.  Plan: The patient will follow-up with radiation oncology in one month.  ________________________________________________   Blair Promise, PhD, MD  This document serves as a record of services personally performed by Gery Pray, MD. It was created on his behalf by Clerance Lav, a trained medical scribe. The creation of this record is based on the scribe's personal observations and the provider's statements to them. This document has been checked and approved by the attending provider.

## 2019-11-16 NOTE — Progress Notes (Signed)
Radiation Oncology         ([phone]) [phone] ________________________________  Name: [name] Richards MRN: [id number]  Date: 11/17/2019  DOB: 03/07/1945  Follow-Up Visit Note  CC: Harlan Stains, MD  Harlan Stains, MD    ICD-10-CM   1. Vulvar cancer [facility])  C51.9     Diagnosis: Recurrent invasive vulvar cancer (VAIN III)  Interval Since Last Radiation: One month and six days  Radiation Treatment Dates: 09/29/2019 through 10/12/2019 Site Technique Total Dose (Gy) Dose per Fx (Gy) Completed Fx Beam Energies  Vagina: Pelvis HDR-brachy 30/30 6 5/5 Ir-192   Narrative:  The patient returns today for routine follow-up.  Patient recently returned from her trip to [place].  She thoroughly enjoyed this visit and will be leaving for [place] several days for 2-week trip.  On review of systems, she reports no side effects from her brachytherapy treatments. She denies difficulties with urination or bowel movements.  She denies any vaginal bleeding.                              ALLERGIES:  is allergic to penicillins, percocet [oxycodone-acetaminophen], and prochlorperazine.  Meds: Current Outpatient Medications  Medication Sig Dispense Refill  . amLODipine (NORVASC) 2.5 MG tablet Take 2.5 mg by mouth daily.     . benazepril-hydrochlorthiazide (LOTENSIN HCT) 20-25 MG tablet Take 1 tablet by mouth daily.     Marland Kitchen BIOTIN PO Take 1 tablet by mouth 4 (four) times a week.    . Cholecalciferol (VITAMIN D3 PO) Take 1 tablet by mouth in the morning and at bedtime.    . docusate sodium (COLACE) 100 MG capsule Take 100 mg by mouth 2 (two) times daily.    Marland Kitchen Fexofenadine HCl (ALLEGRA PO) Take by mouth daily.    Marland Kitchen levothyroxine (SYNTHROID, LEVOTHROID) 75 MCG tablet Take 75 mcg by mouth daily.    Marland Kitchen MAGNESIUM PO Take 1 tablet by mouth daily.    . Multiple Vitamin (MULTIVITAMIN WITH MINERALS) TABS tablet Take 1 tablet by mouth daily.    . Omega-3 Fatty Acids (FISH OIL PO) Take 1 capsule by mouth in the morning and at  bedtime.    . polyethylene glycol (MIRALAX / GLYCOLAX) packet Take 11.3333 g by mouth at bedtime. 2/3 capful    . simvastatin (ZOCOR) 20 MG tablet Take 20 mg by mouth at bedtime.     . clobetasol (TEMOVATE) 0.05 % GEL clobetasol 0.05 % topical gel  APPLY TO ORAL AREAS TWICE DAILY     No current facility-administered medications for this encounter.    Physical Findings: The patient is in no acute distress. Patient is alert and oriented.  height is 5\' 5"  (1.651 m) and weight is 156 lb 6 oz (70.9 kg). Her tympanic temperature is 96.4 F (35.8 C) (abnormal). Her blood pressure is 166/69 (abnormal) and her pulse is 61. Her respiration is 18 and oxygen saturation is 100%.   Lungs are clear to auscultation bilaterally. Heart has regular rate and rhythm. No palpable cervical, supraclavicular, or axillary adenopathy. Abdomen soft, non-tender, normal bowel sounds. Pelvic exam deferred in light of recent treatment completion.   Lab Findings: Lab Results  Component Value Date   WBC 4.7 08/04/2019   HGB 12.6 08/04/2019   HCT 40.1 08/04/2019   MCV 69.5 (L) 08/04/2019   PLT 227 08/04/2019    Radiographic Findings: No results found.  Impression: Recurrent invasive vulvar cancer (VAIN III)  The patient tolerated  her brachytherapy extremely well without any side effects.  We were expecting significant issues with dysuria but this never occurred.  Plan: The patient is scheduled to follow up with Dr. Denman George on 12/21/2019. She will follow up with radiation oncology in feb. 2022.  Today the patient was given a small and extra small vaginal dilator to place in the residual vaginal vault.  She understands that this equipment will penetrate very little into the remaining vagina.  Total time spent in this encounter was 15 minutes which included reviewing the patient's most recent interval history, physical examination, and documentation.  ____________________________________   Blair Promise, PhD,  MD  This document serves as a record of services personally performed by Gery Pray, MD. It was created on his behalf by Clerance Lav, a trained medical scribe. The creation of this record is based on the scribe's personal observations and the provider's statements to them. This document has been checked and approved by the attending provider.

## 2019-11-17 ENCOUNTER — Encounter: Payer: Self-pay | Admitting: Radiation Oncology

## 2019-11-17 ENCOUNTER — Other Ambulatory Visit: Payer: Self-pay

## 2019-11-17 ENCOUNTER — Ambulatory Visit
Admission: RE | Admit: 2019-11-17 | Discharge: 2019-11-17 | Disposition: A | Discharge status: Home / Self Care | Payer: Medicare Other | Source: Ambulatory Visit | Attending: Radiation Oncology | Admitting: Radiation Oncology

## 2019-11-17 DIAGNOSIS — Z923 Personal history of irradiation: Secondary | ICD-10-CM | POA: Diagnosis not present

## 2019-11-17 DIAGNOSIS — C519 Malignant neoplasm of vulva, unspecified: Secondary | ICD-10-CM | POA: Diagnosis present

## 2019-11-17 DIAGNOSIS — R300 Dysuria: Secondary | ICD-10-CM | POA: Diagnosis not present

## 2019-11-17 DIAGNOSIS — Z79899 Other long term (current) drug therapy: Secondary | ICD-10-CM | POA: Diagnosis not present

## 2019-11-17 NOTE — Progress Notes (Addendum)
Patient here for 1 month f/u with Dr. Sondra Come. Patient denies pain or bleeding. She states she has had no bladder or bowel problems.  BP (!) 166/69 (BP Location: Left Arm, Patient Position: Sitting)   Pulse 61   Temp (!) 96.4 F (35.8 C) (Tympanic)   Resp 18   Ht 5\' 5"  (1.651 m)   Wt 156 lb 6 oz (70.9 kg)   SpO2 100%   BMI 26.02 kg/m   Wt Readings from Last 3 Encounters:  11/17/19 156 lb 6 oz (70.9 kg)  09/29/19 150 lb 4 oz (68.2 kg)  09/08/19 148 lb 6 oz (67.3 kg)    Home Care Instructions for the Insertion and Care of Your Vaginal Dilator  Why Do I Need a Vaginal Dilator?  Internal radiation therapy may cause scar tissue to form at the top of your vagina (vaginal cuff).  This may make vaginal examinations difficult in the future. You can prevent scar tissue from forming by using a vaginal dilator (a smooth plastic rod), and/or by having regular sexual intercourse.  If not using the dilator you should be having intercourse two or three times a week.  If you are unable to have intercourse, you should use your vaginal dilator.  You may have some spotting or bleeding from your dilator or intercourse the first few times. You may also have some discomfort. If discomfort occurs with intercourse, you and your partner may need to stop for a while and try again later.  How to Use Your Vaginal Dilator  - Wash the dilator with soap and water before and after each use. - Check the dilator to be sure it is smooth. Do not use the dilator if you find any roughspots. - Coat the dilator with K-Y Jelly, Astroglide, or Replens. Do not use Vaseline, baby oil, or other oil based lubricants. They are not water-soluble and can be irritating to the tissues in the vagina. - Lie on your back with your knees bent and legs apart. - Insert the rounded end of the dilator into your vagina as far as it will go without causing pain or discomfort. - Close your knees and slowly straighten your legs. - Keep  the dilator in your vagina for about 10 to 15 minutes.  Please use 3 times a week, for example: Monday, Wednesday and Friday evenings. [facility] your knees, open your legs, and gently remove the dilator. - Gently cleanse the skin around the vaginal opening. - Wash the dilator after each use. -  It is important that you use the dilator routinely until instructed otherwise by your doctor.  Patient given a small and extra small plus dilator with the above instructions to use. Patient verbalized understanding.

## 2019-11-17 NOTE — Addendum Note (Signed)
Encounter addended by: De Burrs, RN on: 11/17/2019 3:24 PM  Actions taken: Clinical Note Signed

## 2019-12-21 ENCOUNTER — Encounter: Payer: Self-pay | Admitting: Gynecologic Oncology

## 2019-12-21 ENCOUNTER — Inpatient Hospital Stay: Payer: Medicare Other | Attending: Gynecologic Oncology | Admitting: Gynecologic Oncology

## 2019-12-21 ENCOUNTER — Other Ambulatory Visit: Payer: Self-pay

## 2019-12-21 VITALS — BP 160/59 | HR 67 | Temp 97.6°F | Resp 18 | Wt 158.6 lb

## 2019-12-21 DIAGNOSIS — Z8541 Personal history of malignant neoplasm of cervix uteri: Secondary | ICD-10-CM | POA: Insufficient documentation

## 2019-12-21 DIAGNOSIS — I10 Essential (primary) hypertension: Secondary | ICD-10-CM | POA: Insufficient documentation

## 2019-12-21 DIAGNOSIS — Z90710 Acquired absence of both cervix and uterus: Secondary | ICD-10-CM | POA: Insufficient documentation

## 2019-12-21 DIAGNOSIS — Z79899 Other long term (current) drug therapy: Secondary | ICD-10-CM | POA: Diagnosis not present

## 2019-12-21 DIAGNOSIS — Z90722 Acquired absence of ovaries, bilateral: Secondary | ICD-10-CM | POA: Diagnosis not present

## 2019-12-21 DIAGNOSIS — Z8542 Personal history of malignant neoplasm of other parts of uterus: Secondary | ICD-10-CM | POA: Insufficient documentation

## 2019-12-21 DIAGNOSIS — C519 Malignant neoplasm of vulva, unspecified: Secondary | ICD-10-CM | POA: Diagnosis not present

## 2019-12-21 DIAGNOSIS — E785 Hyperlipidemia, unspecified: Secondary | ICD-10-CM | POA: Diagnosis not present

## 2019-12-21 DIAGNOSIS — D072 Carcinoma in situ of vagina: Secondary | ICD-10-CM | POA: Diagnosis not present

## 2019-12-21 DIAGNOSIS — Z923 Personal history of irradiation: Secondary | ICD-10-CM | POA: Insufficient documentation

## 2019-12-21 DIAGNOSIS — E039 Hypothyroidism, unspecified: Secondary | ICD-10-CM | POA: Insufficient documentation

## 2019-12-21 DIAGNOSIS — L900 Lichen sclerosus et atrophicus: Secondary | ICD-10-CM | POA: Diagnosis not present

## 2019-12-21 NOTE — Progress Notes (Signed)
Follow-up Note: Gyn-Onc   [name] Richards 74 y.o. female  Chief Complaint  Patient presents with  . Vulvar cancer ([place]).  Marland Kitchen VAIN III (vaginal intraepithelial neoplasia grade III)  . Lichen sclerosus    Assessment and Plan : History of recurrent vaginal cancer s/p incomplete resection with total vaginectomy 08/11/19 (microscopic residual disease). S/p adjuvant vaginal brachytherapy for positive margins, completed September 2021.  Complete clinical response.  I recommend 3 monthly follow-up.   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 She underwent a total abdominal hysterectomy and bilateral salpingo-oophorectomy, pelvic and para-aortic lymphadenectomy. She received postoperative whole pelvis radiation therapy.  Her other primary gynecologic problem has been lichen sclerosus managed with when necessary clobetasol.  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  In December 2016 the patient was found to have a new primary squamous cell carcinoma the vulva undergoing a modified radical vulvectomy on 02/06/2015. She underwent a modified radical vulvectomy at [facility] on 02/06/2015. She was found to have a 3 cm vulvar lesion of the posterior vulva extending into the posterior vagina. A rhomboid flap was required to close the incision.  Final pathology showed some close surgical margins. However, the specimen margins were toward the time of surgery and it was Dr Mora Bellman impression that they were widely around the primary cancer. She discussed options with the patient and they agreed to monitor her closely but not recommend any radiation therapy to the vulva.  She experienced recurrent vulvar cancer October 2018 managed by a small modified radical vulvectomy of the right side on  January 13, 2017.  Depth of invasion was 4 mm all margins were negative.  Follow-up PET scan March 23, 2017 showed no evidence of metastatic disease.  Examination of the vulva during routine surveillance on 07/20/19 showed a friable, verrucous lesion measuring approximately 2 cm was seen on the anterior wall of the suburethral vagina extending to the introitus and wrapping around to the right mid vagina. This was concerning for recurrence and was biopsied at that appointment. 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   On 08/11/19 she underwent total vaginectomy. Intraoperative findings were significant for a forshortened 3cm vagina, friable 4cm plaque on the anterior vagina extending to the right fornix and left posterior vaginal wall. This necessitated a total vaginectomy to resect the lesion grossly. Her tissues were extremely friable due to age, prior radiation, menopause and lichen sclerosis. There was unavoidable fragmentation of the tissues during the resection. All gross visible disease was removed. Surgery was uncomplicated, and she reported no postop pain.  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 The suspicious foci are present at the edges of tissue fragments.  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   Post-op PET scan in August, 2021 showed no metastatic disease.  Interval Hx:  The patient received adjuvant vaginal brachytherapy between 09/29/2019 through 10/12/2019.  She tolerated treatment very well with no toxicities.  Following her treatment she embarked on international travel to [place].  Review of Systems:10 point review of systems is negative except as noted in interval history.   Vitals: Blood pressure (!) 160/59, pulse 67, temperature 97.6 F  (36.4 C), temperature source Tympanic,  resp. rate 18, weight 158 lb 9.6 oz (71.9 kg), SpO2 100 %.  Physical Exam: General : The patient is a healthy woman in no acute distress.  HEENT: normocephalic, extraoccular movements normal; neck is supple without thyromegally  Lynphnodes: Supraclavicular and inguinal nodes not enlarged  Abdomen: Soft, non-tender, no ascites, no organomegally, no masses, no hernias  Pelvic:  EGBUS: vaginal depth is approximately 2cm. There are no gross lesions on inspection. Smooth vaginal walls with no visible or palpable lesions.  Rectal: deferred   Allergies  Allergen Reactions  . Penicillins Hives, Rash and Other (See Comments)    Has patient had a PCN reaction causing immediate rash, facial/tongue/throat swelling, SOB or lightheadedness with hypotension: NO Has patient had a PCN reaction causing SEVERE RASH INVOLVING MUCUS MEMBRANES OR SKIN NECROSIS: YES Has patient had a PCN reaction that REQUIRED HOSPITALIZATION : patient denies hospitalization Has patient had a PCN reaction occurring within the last 10 years: NO    . Percocet [Oxycodone-Acetaminophen] Other (See Comments)    Made pt feel real funny, never wanting to take it again; near syncopal episode  . Prochlorperazine Other (See Comments)    extrapyramidal reaction Jaws locked    Past Medical History:  Diagnosis Date  . 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 Asthma   . Cervical cancer ([place]) 1997   poorly differentiated cervical carcinoma. S/p TAH and radiation x6 wks   . Complication of anesthesia    took 2 days to wake up after 1 surgery at 20, pt can't use a adult cath,needs peds. cath due to past injury  . Constipation    very worried about this  . Endometrial cancer ([place]) 1997   coincidental endometrial cancer also  . Hyperlipemia   . Hypertension   . Hypothyroidism   . Primary cancer of left upper lobe of lung ([place]) 08/01/2015  . Small bowel obstruction ([place])     12/2008, repeat SBO in 02/2011 also with terminal ilietis   . Thyroid disease   . Vertebral body hemangioma    T12    Past Surgical History:  Procedure Laterality Date  . ABDOMINAL HYSTERECTOMY  Feb 1997   TAH/BSO, pelvic and periaortic lymphadenectomy   . BACK SURGERY    . EYE SURGERY     Lasik   . LYMPH NODE DISSECTION Left 08/01/2015   Procedure: LYMPH NODE DISSECTION;  Surgeon: Melrose Nakayama, MD;  Location: [facility];  Service: Thoracic;  Laterality: Left;  . PARTIAL NEPHRECTOMY     right  . SEGMENTECOMY Left 08/01/2015   Procedure: Left Upper Lobe SEGMENTECTOMY;  Surgeon: Melrose Nakayama, MD;  Location: [facility];  Service: Thoracic;  Laterality: Left;  . SKIN BIOPSY    . TUBAL LIGATION    . TUMOR REMOVAL     meningioma  T 12  . VAGINECTOMY, PARTIAL N/A 08/11/2019   Procedure: TOTAL VAGINECTOMY;  Surgeon: Everitt Amber, MD;  Location: [facility];  Service: Gynecology;  Laterality: N/A;  . VIDEO ASSISTED THORACOSCOPY (VATS)/WEDGE RESECTION Left 08/01/2015   Procedure: VIDEO ASSISTED THORACOSCOPY (VATS)/WEDGE RESECTION;  Surgeon: Melrose Nakayama, MD;  Location: [facility];  Service: Thoracic;  Laterality: Left;  Marland Kitchen VULVA SURGERY     x 2, 1 at [facility] (2016), and [facility]     Current Outpatient Medications  Medication Sig Dispense Refill  . amLODipine (NORVASC) 2.5 MG tablet Take 2.5 mg by mouth daily.     . benazepril-hydrochlorthiazide (LOTENSIN HCT)  20-25 MG tablet Take 1 tablet by mouth daily.     Marland Kitchen BIOTIN PO Take 1 tablet by mouth 4 (four) times a week.    . Cholecalciferol (VITAMIN D3 PO) Take 1 tablet by mouth in the morning and at bedtime.    . clobetasol (TEMOVATE) 0.05 % GEL clobetasol 0.05 % topical gel  APPLY TO ORAL AREAS TWICE DAILY    . docusate sodium (COLACE) 100 MG capsule Take 100 mg by mouth 2 (two) times daily.    Marland Kitchen Fexofenadine HCl (ALLEGRA PO) Take by mouth daily.    Marland Kitchen levothyroxine (SYNTHROID, LEVOTHROID) 75 MCG tablet Take 75 mcg by mouth daily.     Marland Kitchen MAGNESIUM PO Take 1 tablet by mouth daily.    . Multiple Vitamin (MULTIVITAMIN WITH MINERALS) TABS tablet Take 1 tablet by mouth daily.    . Omega-3 Fatty Acids (FISH OIL PO) Take 1 capsule by mouth in the morning and at bedtime.    . polyethylene glycol (MIRALAX / GLYCOLAX) packet Take 11.3333 g by mouth at bedtime. 2/3 capful    . simvastatin (ZOCOR) 20 MG tablet Take 20 mg by mouth at bedtime.      No current facility-administered medications for this visit.    Social History   Socioeconomic History  . Marital status: Divorced    Spouse name: Not on file  . Number of children: Not on file  . Years of education: Not on file  . Highest education level: Not on file  Occupational History  . Not on file  Tobacco Use  . Smoking status: Never Smoker  . Smokeless tobacco: Never Used  Vaping Use  . Vaping Use: Never used  Substance and Sexual Activity  . Alcohol use: No  . Drug use: No  . Sexual activity: Never  Other Topics Concern  . Not on file  Social History Narrative   Lives at home alone, works out at [employer] 6 days a week, still very active. Has an ex-husband who she is in contact with. Son died of an MI and another son in [place].    Social Determinants of Health   Financial Resource Strain:   . Difficulty of Paying Living Expenses: Not on file  Food Insecurity:   . Worried About Charity fundraiser in the Last Year: Not on file  . Ran Out of Food in the Last Year: Not on file  Transportation Needs:   . Lack of Transportation (Medical): Not on file  . Lack of Transportation (Non-Medical): Not on file  Physical Activity:   . Days of Exercise per Week: Not on file  . Minutes of Exercise per Session: Not on file  Stress:   . Feeling of Stress : Not on file  Social Connections:   . Frequency of Communication with Friends and Family: Not on file  . Frequency of Social Gatherings with Friends and Family: Not on file  . Attends Religious Services: Not on file  .  Active Member of Clubs or Organizations: Not on file  . Attends Archivist Meetings: Not on file  . Marital Status: Not on file  Intimate Partner Violence:   . Fear of Current or Ex-Partner: Not on file  . Emotionally Abused: Not on file  . Physically Abused: Not on file  . Sexually Abused: Not on file    Family History  Problem Relation Age of Onset  . Hypertension Other   . Diabetes Other   . Stroke Other   .  CAD Other     45 minutes of direct face to face counseling time was spent with the patient. This included complex discussion about prognosis, therapy recommendations, toxicities of therapy and postoperative side effects and are beyond the scope of routine postoperative care.  Thereasa Solo, MD 12/21/2019, 4:14 PM

## 2019-12-21 NOTE — Patient Instructions (Signed)
Please have Dr Clabe Seal office contact Dr Serita Grit office (at [phone]) in February after your visit with him to request an appointment with her for May, 2022.

## 2020-02-15 DIAGNOSIS — I10 Essential (primary) hypertension: Secondary | ICD-10-CM | POA: Diagnosis not present

## 2020-02-15 DIAGNOSIS — R42 Dizziness and giddiness: Secondary | ICD-10-CM | POA: Diagnosis not present

## 2020-02-16 DIAGNOSIS — Z03818 Encounter for observation for suspected exposure to other biological agents ruled out: Secondary | ICD-10-CM | POA: Diagnosis not present

## 2020-02-16 DIAGNOSIS — Z20822 Contact with and (suspected) exposure to covid-19: Secondary | ICD-10-CM | POA: Diagnosis not present

## 2020-03-06 DIAGNOSIS — E785 Hyperlipidemia, unspecified: Secondary | ICD-10-CM | POA: Diagnosis not present

## 2020-03-06 DIAGNOSIS — J45991 Cough variant asthma: Secondary | ICD-10-CM | POA: Diagnosis not present

## 2020-03-06 DIAGNOSIS — I2510 Atherosclerotic heart disease of native coronary artery without angina pectoris: Secondary | ICD-10-CM | POA: Diagnosis not present

## 2020-03-06 DIAGNOSIS — E039 Hypothyroidism, unspecified: Secondary | ICD-10-CM | POA: Diagnosis not present

## 2020-03-06 DIAGNOSIS — Z85118 Personal history of other malignant neoplasm of bronchus and lung: Secondary | ICD-10-CM | POA: Diagnosis not present

## 2020-03-06 DIAGNOSIS — I10 Essential (primary) hypertension: Secondary | ICD-10-CM | POA: Diagnosis not present

## 2020-03-07 DIAGNOSIS — H40003 Preglaucoma, unspecified, bilateral: Secondary | ICD-10-CM | POA: Diagnosis not present

## 2020-03-07 DIAGNOSIS — H35371 Puckering of macula, right eye: Secondary | ICD-10-CM | POA: Diagnosis not present

## 2020-03-22 ENCOUNTER — Ambulatory Visit
Admission: RE | Admit: 2020-03-22 | Discharge: 2020-03-22 | Disposition: A | Discharge status: Home / Self Care | Payer: Medicare Other | Source: Ambulatory Visit | Attending: Radiation Oncology | Admitting: Radiation Oncology

## 2020-03-22 ENCOUNTER — Encounter: Payer: Self-pay | Admitting: Radiation Oncology

## 2020-03-22 ENCOUNTER — Encounter: Payer: Self-pay | Admitting: Gynecologic Oncology

## 2020-03-22 ENCOUNTER — Other Ambulatory Visit: Payer: Self-pay

## 2020-03-22 VITALS — BP 154/74 | HR 76 | Temp 97.0°F | Resp 18 | Ht 60.0 in | Wt 158.0 lb

## 2020-03-22 DIAGNOSIS — Z923 Personal history of irradiation: Secondary | ICD-10-CM | POA: Diagnosis not present

## 2020-03-22 DIAGNOSIS — Z79899 Other long term (current) drug therapy: Secondary | ICD-10-CM | POA: Insufficient documentation

## 2020-03-22 DIAGNOSIS — C519 Malignant neoplasm of vulva, unspecified: Secondary | ICD-10-CM | POA: Insufficient documentation

## 2020-03-22 DIAGNOSIS — C539 Malignant neoplasm of cervix uteri, unspecified: Secondary | ICD-10-CM

## 2020-03-22 DIAGNOSIS — R300 Dysuria: Secondary | ICD-10-CM | POA: Diagnosis not present

## 2020-03-22 DIAGNOSIS — Z08 Encounter for follow-up examination after completed treatment for malignant neoplasm: Secondary | ICD-10-CM | POA: Diagnosis not present

## 2020-03-22 LAB — URINALYSIS, COMPLETE (UACMP) WITH MICROSCOPIC
Bilirubin Urine: NEGATIVE
Glucose, UA: NEGATIVE mg/dL
Hgb urine dipstick: NEGATIVE
Ketones, ur: NEGATIVE mg/dL
Leukocytes,Ua: NEGATIVE
Nitrite: NEGATIVE
Protein, ur: NEGATIVE mg/dL
Specific Gravity, Urine: 1.014 (ref 1.005–1.030)
pH: 6 (ref 5.0–8.0)

## 2020-03-22 NOTE — Progress Notes (Signed)
Patient is here today following treatment to her vulvar in September 2021.  No pain discomfort.  Denies diarrhea/constipation/nausea/vomiting.  Reports when she urinates her urine is stronger in odor.  Urine is clear and not concentrated.  Denies burning with urination.  Denies hematuria and hematochezia.  Denies vaginal discharge.    Denies being sexually active.  Reports using vaginal dilator twice a week.  Reports limited opening to the vagina so it is painful even with lubricate involved.  Vitals:   03/22/20 1031  BP: (!) 154/74  Pulse: 76  Resp: 18  Temp: (!) 97 F (36.1 C)  TempSrc: Temporal  SpO2: 100%  Weight: 158 lb (71.7 kg)  Height: 5' (1.524 m)

## 2020-03-22 NOTE — Progress Notes (Signed)
Radiation Oncology         (336) [phone] ________________________________  Name: [name] Richards MRN: [id number]  Date: 03/22/2020  DOB: 05-05-1945  Follow-Up Visit Note  CC: Harlan Stains, MD  Harlan Stains, MD    ICD-10-CM   1. Vulvar cancer [facility])  C51.9 Urine Culture    Urinalysis, Complete w Microscopic  2. Malignant neoplasm of cervix, unspecified site [facility])  C53.9     Diagnosis: Recurrent invasive vulvar cancer (VAIN III)  Interval Since Last Radiation: Five months, one week, and two days  Radiation Treatment Dates: 09/29/2019 through 10/12/2019 Site Technique Total Dose (Gy) Dose per Fx (Gy) Completed Fx Beam Energies  Vagina: Pelvis HDR-brachy 30/30 6 5/5 Ir-192   Narrative:  The patient returns today for routine follow-up. The patient was last seen by Dr. Denman George on 12/21/2019, at which time she was noted to have had a complete clinical response.  On review of systems, she reports no new medical issues.  She continues to travel frequently and enjoys this. She denies vaginal bleeding hematuria or rectal bleeding.  She is using her vaginal dilator twice a week.  She does report a strong odor to her urine but no other urinary symptoms.    ALLERGIES:  is allergic to penicillins, percocet [oxycodone-acetaminophen], and prochlorperazine.  Meds: Current Outpatient Medications  Medication Sig Dispense Refill  . amLODipine (NORVASC) 2.5 MG tablet Take 2.5 mg by mouth daily.     . benazepril-hydrochlorthiazide (LOTENSIN HCT) 20-25 MG tablet Take 1 tablet by mouth daily.     Marland Kitchen BIOTIN PO Take 1 tablet by mouth 4 (four) times a week.    . Cholecalciferol (VITAMIN D3 PO) Take 1 tablet by mouth in the morning and at bedtime.    . clobetasol (TEMOVATE) 0.05 % GEL clobetasol 0.05 % topical gel  APPLY TO ORAL AREAS TWICE DAILY    . docusate sodium (COLACE) 100 MG capsule Take 100 mg by mouth 2 (two) times daily.    Marland Kitchen Fexofenadine HCl (ALLEGRA PO) Take by mouth daily.    Marland Kitchen levothyroxine  (SYNTHROID, LEVOTHROID) 75 MCG tablet Take 75 mcg by mouth daily.    Marland Kitchen MAGNESIUM PO Take 1 tablet by mouth daily.    . Multiple Vitamin (MULTIVITAMIN WITH MINERALS) TABS tablet Take 1 tablet by mouth daily.    . Omega-3 Fatty Acids (FISH OIL PO) Take 1 capsule by mouth in the morning and at bedtime.    . polyethylene glycol (MIRALAX / GLYCOLAX) packet Take 11.3333 g by mouth at bedtime. 2/3 capful    . simvastatin (ZOCOR) 20 MG tablet Take 20 mg by mouth at bedtime.      No current facility-administered medications for this encounter.    Physical Findings: The patient is in no acute distress. Patient is alert and oriented.  height is 5' (1.524 m) and weight is 158 lb (71.7 kg). Her temporal temperature is 97 F (36.1 C) (abnormal). Her blood pressure is 154/74 (abnormal) and her pulse is 76. Her respiration is 18 and oxygen saturation is 100%.   Lungs are clear to auscultation bilaterally. Heart has regular rate and rhythm. No palpable cervical, supraclavicular, or axillary adenopathy. Abdomen soft, non-tender, normal bowel sounds. On pelvic examination surgical changes noted along the distal vagina/ vulvar region.  Some radiation changes noted in the proximal vagina.  No mucosal lesions noted.  The vaginal vault is approximately 2 cm in length. A pediatric speculum was used for the exam.  On bimanual exam no obvious  pelvic masses appreciated.  Lab Findings: Lab Results  Component Value Date   WBC 4.7 08/04/2019   HGB 12.6 08/04/2019   HCT 40.1 08/04/2019   MCV 69.5 (L) 08/04/2019   PLT 227 08/04/2019    Radiographic Findings: No results found.  Impression: Recurrent invasive vulvar cancer (VAIN III)  No clinical evidence of recurrence.   Plan: The patient will follow up with Dr. Denman George in three months and with radiation oncology in six months.  She will present to the lab for urinalysis culture and sensitivity to rule out urinary infection as the cause of her urine odor.  Total  time spent in this encounter was 20 minutes which included reviewing the patient's most recent follow-up with Dr. Denman George, physical examination, and documentation.  ____________________________________   Blair Promise, PhD, MD  This document serves as a record of services personally performed by Gery Pray, MD. It was created on his behalf by Clerance Lav, a trained medical scribe. The creation of this record is based on the scribe's personal observations and the provider's statements to them. This document has been checked and approved by the attending provider.

## 2020-03-24 LAB — URINE CULTURE: Culture: >=100000 — AB

## 2020-03-25 DIAGNOSIS — Z20822 Contact with and (suspected) exposure to covid-19: Secondary | ICD-10-CM | POA: Diagnosis not present

## 2020-03-25 DIAGNOSIS — Z03818 Encounter for observation for suspected exposure to other biological agents ruled out: Secondary | ICD-10-CM | POA: Diagnosis not present

## 2020-03-27 ENCOUNTER — Telehealth: Payer: Self-pay | Admitting: *Deleted

## 2020-03-27 ENCOUNTER — Other Ambulatory Visit: Payer: Self-pay | Admitting: Radiation Oncology

## 2020-03-27 MED ORDER — SULFAMETHOXAZOLE-TRIMETHOPRIM 800-160 MG PO TABS: 1.0000 | ORAL_TABLET | Freq: Two times a day (BID) | ORAL | 0 refills | Status: DC

## 2020-03-27 NOTE — Telephone Encounter (Signed)
Patient did have positive culture results of urinalysis.  Reviewed her allergies with patient.  Confirmed penicillin allergy.  Will communicate this information with Dr Sondra Come.  She states she hasn't had a UTI in over 20 years so she has no preferences to antibiotics.  He will call in a Rx to [facility].

## 2020-04-11 ENCOUNTER — Encounter: Payer: Self-pay | Admitting: Gynecologic Oncology

## 2020-04-13 ENCOUNTER — Telehealth: Payer: Self-pay | Admitting: *Deleted

## 2020-04-13 NOTE — Telephone Encounter (Signed)
Patient called back and scheduled a follow up appt for 5/5

## 2020-04-13 NOTE — Telephone Encounter (Signed)
Called and left the patient a message to call the office back. Patient needs to be scheduled for a follow up appt in May

## 2020-04-18 DIAGNOSIS — Z7184 Encounter for health counseling related to travel: Secondary | ICD-10-CM | POA: Diagnosis not present

## 2020-04-18 DIAGNOSIS — R21 Rash and other nonspecific skin eruption: Secondary | ICD-10-CM | POA: Diagnosis not present

## 2020-04-18 DIAGNOSIS — I10 Essential (primary) hypertension: Secondary | ICD-10-CM | POA: Diagnosis not present

## 2020-04-26 DIAGNOSIS — Z1231 Encounter for screening mammogram for malignant neoplasm of breast: Secondary | ICD-10-CM | POA: Diagnosis not present

## 2020-04-26 DIAGNOSIS — L03012 Cellulitis of left finger: Secondary | ICD-10-CM | POA: Diagnosis not present

## 2020-05-01 DIAGNOSIS — L03012 Cellulitis of left finger: Secondary | ICD-10-CM | POA: Diagnosis not present

## 2020-05-08 ENCOUNTER — Encounter: Payer: Self-pay | Admitting: Gynecologic Oncology

## 2020-06-12 NOTE — Progress Notes (Signed)
Follow-up Note: Gyn-Onc   [name] Richards 75 y.o. female  Chief Complaint  Patient presents with  . Vulvar cancer ([place])  . VAIN III (vaginal intraepithelial neoplasia grade III)  . Lichen sclerosus    Assessment and Plan : History of recurrent vaginal cancer s/p incomplete resection with total vaginectomy 08/11/19 (microscopic residual disease). S/p adjuvant vaginal brachytherapy for positive margins, completed September 2021.  Complete clinical response.  I recommend 3 monthly follow-up.   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 She underwent a total abdominal hysterectomy and bilateral salpingo-oophorectomy, pelvic and para-aortic lymphadenectomy. She received postoperative whole pelvis radiation therapy.  Her other primary gynecologic problem has been lichen sclerosus managed with when necessary clobetasol.  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  In December 2016 the patient was found to have a new primary squamous cell carcinoma the vulva undergoing a modified radical vulvectomy on 02/06/2015. She underwent a modified radical vulvectomy at [facility] on 02/06/2015. She was found to have a 3 cm vulvar lesion of the posterior vulva extending into the posterior vagina. A rhomboid flap was required to close the incision.  Final pathology showed some close surgical margins. However, the specimen margins were toward the time of surgery and it was Dr Mora Bellman impression that they were widely around the primary cancer. She discussed options with the patient and they agreed to monitor her closely but not recommend any radiation therapy to the vulva.  She experienced recurrent vulvar cancer October 2018 managed by a small modified radical vulvectomy of the right side on  January 13, 2017.  Depth of invasion was 4 mm all margins were negative.  Follow-up PET scan March 23, 2017 showed no evidence of metastatic disease.  Examination of the vulva during routine surveillance on 07/20/19 showed a friable, verrucous lesion measuring approximately 2 cm was seen on the anterior wall of the suburethral vagina extending to the introitus and wrapping around to the right mid vagina. This was concerning for recurrence and was biopsied at that appointment. 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   On 08/11/19 she underwent total vaginectomy. Intraoperative findings were significant for a forshortened 3cm vagina, friable 4cm plaque on the anterior vagina extending to the right fornix and left posterior vaginal wall. This necessitated a total vaginectomy to resect the lesion grossly. Her tissues were extremely friable due to age, prior radiation, menopause and lichen sclerosis. There was unavoidable fragmentation of the tissues during the resection. All gross visible disease was removed. Surgery was uncomplicated, and she reported no postop pain.  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 The suspicious foci are present at the edges of tissue fragments.  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   Post-op PET scan in August, 2021 showed no metastatic disease.  Interval Hx:  The patient received adjuvant vaginal brachytherapy between 09/29/2019 through 10/12/2019.  She tolerated treatment very well with no toxicities.  Following her treatment she embarked on international travel to [place] and had a lovely trip including travel to [place].  She has no symptoms of recurrence.  Review of Systems:10 point review of systems is negative except as noted in  interval history.  Vitals: Blood pressure (!) 164/82, pulse 62, temperature 97.8 F (36.6 C), temperature source Tympanic, resp. rate 16, height 5' (1.524 m), weight 158 lb 9.6 oz (71.9 kg), SpO2 100 %.  Physical Exam: General : The patient is a healthy woman in no acute distress.  HEENT: normocephalic, extraoccular movements normal; neck is supple without thyromegally  Lynphnodes: Supraclavicular and inguinal nodes not enlarged  Abdomen: Soft, non-tender, no ascites, no organomegally, no masses, no hernias  Pelvic:  EGBUS:  vaginal depth is approximately 2cm. There are no gross lesions on inspection. Smooth vaginal walls with no visible or palpable lesions. Acetic acid applied to vagina and vulva with no lesions seen. Rectal: deferred   Allergies  Allergen Reactions  . Penicillins Hives, Rash and Other (See Comments)    Has patient had a PCN reaction causing immediate rash, facial/tongue/throat swelling, SOB or lightheadedness with hypotension: NO Has patient had a PCN reaction causing SEVERE RASH INVOLVING MUCUS MEMBRANES OR SKIN NECROSIS: YES Has patient had a PCN reaction that REQUIRED HOSPITALIZATION : patient denies hospitalization Has patient had a PCN reaction occurring within the last 10 years: NO    . Percocet [Oxycodone-Acetaminophen] Other (See Comments)    Made pt feel real funny, never wanting to take it again; near syncopal episode  . Prochlorperazine Other (See Comments)    extrapyramidal reaction Jaws locked  . Trazodone Hcl     Other reaction(s): dizziness    Past Medical History:  Diagnosis Date  . 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 Asthma   . Cervical cancer ([place]) 1997   poorly differentiated cervical carcinoma. S/p TAH and radiation x6 wks   . Complication of anesthesia    took 2 days to wake up after 1 surgery at 20, pt can't use a adult cath,needs peds. cath due to past injury  . Constipation    very worried about this  .  Endometrial cancer ([place]) 1997   coincidental endometrial cancer also  . Hyperlipemia   . Hypertension   . Hypothyroidism   . Primary cancer of left upper lobe of lung ([place]) 08/01/2015  . Small bowel obstruction ([place])    12/2008, repeat SBO in 02/2011 also with terminal ilietis   . Thyroid disease   . Vertebral body hemangioma    T12    Past Surgical History:  Procedure Laterality Date  . ABDOMINAL HYSTERECTOMY  Feb 1997   TAH/BSO, pelvic and periaortic lymphadenectomy   . BACK SURGERY    . EYE SURGERY     Lasik   . LYMPH NODE DISSECTION Left 08/01/2015   Procedure: LYMPH NODE DISSECTION;  Surgeon: Melrose Nakayama, MD;  Location: [place];  Service: Thoracic;  Laterality: Left;  . PARTIAL NEPHRECTOMY     right  . SEGMENTECOMY Left 08/01/2015   Procedure: Left Upper Lobe SEGMENTECTOMY;  Surgeon: Melrose Nakayama, MD;  Location: [place];  Service: Thoracic;  Laterality: Left;  . SKIN BIOPSY    . TUBAL LIGATION    . TUMOR REMOVAL     meningioma  T 12  . VAGINECTOMY, PARTIAL N/A 08/11/2019   Procedure: TOTAL VAGINECTOMY;  Surgeon: Everitt Amber, MD;  Location: [facility];  Service: Gynecology;  Laterality: N/A;  . VIDEO ASSISTED THORACOSCOPY (VATS)/WEDGE RESECTION Left 08/01/2015   Procedure: VIDEO ASSISTED THORACOSCOPY (VATS)/WEDGE RESECTION;  Surgeon: Melrose Nakayama, MD;  Location: [place];  Service: Thoracic;  Laterality: Left;  Marland Kitchen VULVA SURGERY     x 2,  1 at [facility] (2016), and [facility]     Current Outpatient Medications  Medication Sig Dispense Refill  . amLODipine (NORVASC) 2.5 MG tablet Take 2.5 mg by mouth daily.     . benazepril-hydrochlorthiazide (LOTENSIN HCT) 20-25 MG tablet Take 1 tablet by mouth daily.     Marland Kitchen BIOTIN PO Take 1 tablet by mouth 4 (four) times a week.    . Cholecalciferol (VITAMIN D3 PO) Take 1 tablet by mouth in the morning and at bedtime.    . clobetasol (TEMOVATE) 0.05 % GEL clobetasol 0.05 % topical gel  APPLY TO ORAL AREAS TWICE DAILY    .  docusate sodium (COLACE) 100 MG capsule Take 100 mg by mouth 2 (two) times daily.    Marland Kitchen Fexofenadine HCl (ALLEGRA PO) Take by mouth daily.    Marland Kitchen levothyroxine (SYNTHROID, LEVOTHROID) 75 MCG tablet Take 75 mcg by mouth daily.    Marland Kitchen MAGNESIUM PO Take 1 tablet by mouth daily.    . Multiple Vitamin (MULTIVITAMIN WITH MINERALS) TABS tablet Take 1 tablet by mouth daily.    . Omega-3 Fatty Acids (FISH OIL PO) Take 1 capsule by mouth daily.    . polyethylene glycol (MIRALAX / GLYCOLAX) packet Take 11.3333 g by mouth at bedtime. 2/3 capful    . simvastatin (ZOCOR) 20 MG tablet Take 20 mg by mouth at bedtime.      No current facility-administered medications for this visit.    Social History   Socioeconomic History  . Marital status: Divorced    Spouse name: Not on file  . Number of children: Not on file  . Years of education: Not on file  . Highest education level: Not on file  Occupational History  . Not on file  Tobacco Use  . Smoking status: Never Smoker  . Smokeless tobacco: Never Used  Vaping Use  . Vaping Use: Never used  Substance and Sexual Activity  . Alcohol use: No  . Drug use: No  . Sexual activity: Never  Other Topics Concern  . Not on file  Social History Narrative   Lives at home alone, works out at [employer] 6 days a week, still very active. Has an ex-husband who she is in contact with. Son died of an MI and another son in [place].    Social Determinants of Health   Financial Resource Strain: Not on file  Food Insecurity: Not on file  Transportation Needs: Not on file  Physical Activity: Not on file  Stress: Not on file  Social Connections: Not on file  Intimate Partner Violence: Not on file    Family History  Problem Relation Age of Onset  . Hypertension Other   . Diabetes Other   . Stroke Other   . CAD Other     Thereasa Solo, MD 06/14/2020, 2:06 PM

## 2020-06-13 ENCOUNTER — Encounter: Payer: Self-pay | Admitting: Gynecologic Oncology

## 2020-06-14 ENCOUNTER — Inpatient Hospital Stay: Payer: Medicare Other | Attending: Gynecologic Oncology | Admitting: Gynecologic Oncology

## 2020-06-14 ENCOUNTER — Other Ambulatory Visit: Payer: Self-pay

## 2020-06-14 VITALS — BP 164/82 | HR 62 | Temp 97.8°F | Resp 16 | Ht 60.0 in | Wt 158.6 lb

## 2020-06-14 DIAGNOSIS — Z90710 Acquired absence of both cervix and uterus: Secondary | ICD-10-CM | POA: Diagnosis not present

## 2020-06-14 DIAGNOSIS — E785 Hyperlipidemia, unspecified: Secondary | ICD-10-CM | POA: Insufficient documentation

## 2020-06-14 DIAGNOSIS — C519 Malignant neoplasm of vulva, unspecified: Secondary | ICD-10-CM

## 2020-06-14 DIAGNOSIS — Z923 Personal history of irradiation: Secondary | ICD-10-CM | POA: Insufficient documentation

## 2020-06-14 DIAGNOSIS — Z8542 Personal history of malignant neoplasm of other parts of uterus: Secondary | ICD-10-CM | POA: Diagnosis not present

## 2020-06-14 DIAGNOSIS — Z79899 Other long term (current) drug therapy: Secondary | ICD-10-CM | POA: Diagnosis not present

## 2020-06-14 DIAGNOSIS — L900 Lichen sclerosus et atrophicus: Secondary | ICD-10-CM | POA: Diagnosis not present

## 2020-06-14 DIAGNOSIS — I10 Essential (primary) hypertension: Secondary | ICD-10-CM | POA: Insufficient documentation

## 2020-06-14 DIAGNOSIS — Z8541 Personal history of malignant neoplasm of cervix uteri: Secondary | ICD-10-CM

## 2020-06-14 DIAGNOSIS — D072 Carcinoma in situ of vagina: Secondary | ICD-10-CM

## 2020-06-14 DIAGNOSIS — Z90722 Acquired absence of ovaries, bilateral: Secondary | ICD-10-CM | POA: Insufficient documentation

## 2020-06-14 NOTE — Patient Instructions (Signed)
Please notify Dr Denman George at phone number [phone] if you notice vulvar itch, a painless lump or bump, vulvar skin discoloration, or a new swelling in one leg.   Please have Dr Clabe Seal office contact Dr Serita Grit office (at [phone]) in August after your appointment with him to request an appointment with Dr Denman George for November, 2022.

## 2020-06-28 DIAGNOSIS — Z03818 Encounter for observation for suspected exposure to other biological agents ruled out: Secondary | ICD-10-CM | POA: Diagnosis not present

## 2020-06-28 DIAGNOSIS — E039 Hypothyroidism, unspecified: Secondary | ICD-10-CM | POA: Diagnosis not present

## 2020-06-28 DIAGNOSIS — Z1211 Encounter for screening for malignant neoplasm of colon: Secondary | ICD-10-CM | POA: Diagnosis not present

## 2020-06-28 DIAGNOSIS — E785 Hyperlipidemia, unspecified: Secondary | ICD-10-CM | POA: Diagnosis not present

## 2020-06-28 DIAGNOSIS — R7303 Prediabetes: Secondary | ICD-10-CM | POA: Diagnosis not present

## 2020-06-28 DIAGNOSIS — I10 Essential (primary) hypertension: Secondary | ICD-10-CM | POA: Diagnosis not present

## 2020-06-28 DIAGNOSIS — J301 Allergic rhinitis due to pollen: Secondary | ICD-10-CM | POA: Diagnosis not present

## 2020-06-28 DIAGNOSIS — Z0000 Encounter for general adult medical examination without abnormal findings: Secondary | ICD-10-CM | POA: Diagnosis not present

## 2020-06-28 DIAGNOSIS — M25562 Pain in left knee: Secondary | ICD-10-CM | POA: Diagnosis not present

## 2020-06-28 DIAGNOSIS — R0981 Nasal congestion: Secondary | ICD-10-CM | POA: Diagnosis not present

## 2020-06-28 DIAGNOSIS — E559 Vitamin D deficiency, unspecified: Secondary | ICD-10-CM | POA: Diagnosis not present

## 2020-06-28 DIAGNOSIS — R7309 Other abnormal glucose: Secondary | ICD-10-CM | POA: Diagnosis not present

## 2020-06-29 DIAGNOSIS — G8929 Other chronic pain: Secondary | ICD-10-CM | POA: Diagnosis not present

## 2020-06-29 DIAGNOSIS — M25562 Pain in left knee: Secondary | ICD-10-CM | POA: Diagnosis not present

## 2020-07-24 DIAGNOSIS — I10 Essential (primary) hypertension: Secondary | ICD-10-CM | POA: Diagnosis not present

## 2020-07-24 DIAGNOSIS — J45991 Cough variant asthma: Secondary | ICD-10-CM | POA: Diagnosis not present

## 2020-07-24 DIAGNOSIS — Z85118 Personal history of other malignant neoplasm of bronchus and lung: Secondary | ICD-10-CM | POA: Diagnosis not present

## 2020-07-24 DIAGNOSIS — E039 Hypothyroidism, unspecified: Secondary | ICD-10-CM | POA: Diagnosis not present

## 2020-07-24 DIAGNOSIS — I2510 Atherosclerotic heart disease of native coronary artery without angina pectoris: Secondary | ICD-10-CM | POA: Diagnosis not present

## 2020-07-24 DIAGNOSIS — E785 Hyperlipidemia, unspecified: Secondary | ICD-10-CM | POA: Diagnosis not present

## 2020-07-24 DIAGNOSIS — G8929 Other chronic pain: Secondary | ICD-10-CM | POA: Diagnosis not present

## 2020-08-30 DIAGNOSIS — R3915 Urgency of urination: Secondary | ICD-10-CM | POA: Diagnosis not present

## 2020-09-13 DIAGNOSIS — Z20822 Contact with and (suspected) exposure to covid-19: Secondary | ICD-10-CM | POA: Diagnosis not present

## 2020-09-18 DIAGNOSIS — R058 Other specified cough: Secondary | ICD-10-CM | POA: Diagnosis not present

## 2020-09-18 DIAGNOSIS — U071 COVID-19: Secondary | ICD-10-CM | POA: Diagnosis not present

## 2020-09-27 ENCOUNTER — Ambulatory Visit: Payer: Self-pay | Admitting: Radiation Oncology

## 2020-10-10 DIAGNOSIS — J45991 Cough variant asthma: Secondary | ICD-10-CM | POA: Diagnosis not present

## 2020-10-10 DIAGNOSIS — I10 Essential (primary) hypertension: Secondary | ICD-10-CM | POA: Diagnosis not present

## 2020-10-10 DIAGNOSIS — E039 Hypothyroidism, unspecified: Secondary | ICD-10-CM | POA: Diagnosis not present

## 2020-10-10 DIAGNOSIS — E785 Hyperlipidemia, unspecified: Secondary | ICD-10-CM | POA: Diagnosis not present

## 2020-10-10 DIAGNOSIS — G8929 Other chronic pain: Secondary | ICD-10-CM | POA: Diagnosis not present

## 2020-10-10 DIAGNOSIS — I2510 Atherosclerotic heart disease of native coronary artery without angina pectoris: Secondary | ICD-10-CM | POA: Diagnosis not present

## 2020-10-12 ENCOUNTER — Encounter: Payer: Self-pay | Admitting: Radiology

## 2020-10-17 DIAGNOSIS — Z1211 Encounter for screening for malignant neoplasm of colon: Secondary | ICD-10-CM | POA: Diagnosis not present

## 2020-10-17 NOTE — Progress Notes (Signed)
Radiation Oncology         (336) [phone] ________________________________  Name: [name] Richards MRN: [id number]  Date: 10/18/2020  DOB: 11/12/1945  Follow-Up Visit Note  CC: Harlan Stains, MD  Harlan Stains, MD    ICD-10-CM   1. Vulvar cancer [facility])  C51.9       Diagnosis: Mid thoracic and centimeters #upper lobe question left arm  Interval Since Last Radiation: 1 year and 1 week   Radiation Treatment Dates: 09/29/2019 through 10/12/2019 Site Technique Total Dose (Gy) Dose per Fx (Gy) Completed Fx Beam Energies  Vagina: Pelvis HDR-brachy 30/30 6 5/5 Ir-192   Narrative:  The patient returns today for routine follow-up, she was last seen by me on 03/22/20. Since that time, she most recently followed up with Dr. Denman George on 06/14/20. During which time, Dr. Denman George reported the patient to exhibit no evidence of disease recurrence.     The patient otherwise has had no significant interval history since she was last seen.  Reports occasional incomplete bladder emptying but no the issues.                      Allergies:  is allergic to penicillins, percocet [oxycodone-acetaminophen], prochlorperazine, and trazodone hcl.  Meds: Current Outpatient Medications  Medication Sig Dispense Refill   amLODipine (NORVASC) 2.5 MG tablet Take 2.5 mg by mouth daily.      benazepril-hydrochlorthiazide (LOTENSIN HCT) 20-25 MG tablet Take 1 tablet by mouth daily.      BIOTIN PO Take 1 tablet by mouth 4 (four) times a week.     Cholecalciferol (VITAMIN D3 PO) Take 1 tablet by mouth in the morning and at bedtime.     clobetasol (TEMOVATE) 0.05 % GEL clobetasol 0.05 % topical gel  APPLY TO ORAL AREAS TWICE DAILY     docusate sodium (COLACE) 100 MG capsule Take 100 mg by mouth 2 (two) times daily.     Fexofenadine HCl (ALLEGRA PO) Take by mouth daily.     levothyroxine (SYNTHROID, LEVOTHROID) 75 MCG tablet Take 75 mcg by mouth daily.     MAGNESIUM PO Take 1 tablet by mouth daily.     Multiple Vitamin  (MULTIVITAMIN WITH MINERALS) TABS tablet Take 1 tablet by mouth daily.     Omega-3 Fatty Acids (FISH OIL PO) Take 1 capsule by mouth daily.     polyethylene glycol (MIRALAX / GLYCOLAX) packet Take 11.3333 g by mouth at bedtime. 2/3 capful     simvastatin (ZOCOR) 20 MG tablet Take 20 mg by mouth at bedtime.      No current facility-administered medications for this encounter.    Physical Findings: The patient is in no acute distress. Patient is alert and oriented.  height is 5\' 5"  (1.651 m) and weight is 154 lb 9.6 oz (70.1 kg). Her temperature is 97.8 F (36.6 C). Her blood pressure is 154/81 (abnormal) and her pulse is 69. Her respiration is 20 and oxygen saturation is 100%. .  No significant changes. Lungs are clear to auscultation bilaterally. Heart has regular rate and rhythm. No palpable cervical, supraclavicular, or axillary adenopathy. Abdomen soft, non-tender, normal bowel sounds.   On pelvic examination no lesions noted along the external genitalia.  Vaginal vault is significantly shortened at approximately 2 cm.  Speculum exam and digital exam was performed.  The mucosa is tender with palpation.  No palpable nodules or lesions noted on careful examination.  Lab Findings: Lab Results  Component Value Date   WBC  4.7 08/04/2019   HGB 12.6 08/04/2019   HCT 40.1 08/04/2019   MCV 69.5 (L) 08/04/2019   PLT 227 08/04/2019    Radiographic Findings: No results found.  Impression:    No evidence of recurrence on clinical exam today.  Plan: Patient will follow up with Dr. Berline Lopes in 3 months.  Radiation oncology in 6 months.   20 minutes of total time was spent for this patient encounter, including preparation, face-to-face counseling with the patient and coordination of care, physical exam, and documentation of the encounter. ____________________________________  Blair Promise, PhD, MD   This document serves as a record of services personally performed by Gery Pray, MD. It  was created on his behalf by Roney Mans, a trained medical scribe. The creation of this record is based on the scribe's personal observations and the provider's statements to them. This document has been checked and approved by the attending provider.

## 2020-10-18 ENCOUNTER — Ambulatory Visit
Admission: RE | Admit: 2020-10-18 | Discharge: 2020-10-18 | Disposition: A | Discharge status: Home / Self Care | Payer: Medicare Other | Source: Ambulatory Visit | Attending: Radiation Oncology | Admitting: Radiation Oncology

## 2020-10-18 ENCOUNTER — Encounter: Payer: Self-pay | Admitting: Radiation Oncology

## 2020-10-18 ENCOUNTER — Other Ambulatory Visit: Payer: Self-pay

## 2020-10-18 VITALS — BP 154/81 | HR 69 | Temp 97.8°F | Resp 20 | Ht 65.0 in | Wt 154.6 lb

## 2020-10-18 DIAGNOSIS — Z79899 Other long term (current) drug therapy: Secondary | ICD-10-CM | POA: Insufficient documentation

## 2020-10-18 DIAGNOSIS — Z8589 Personal history of malignant neoplasm of other organs and systems: Secondary | ICD-10-CM | POA: Diagnosis not present

## 2020-10-18 DIAGNOSIS — R339 Retention of urine, unspecified: Secondary | ICD-10-CM | POA: Diagnosis not present

## 2020-10-18 DIAGNOSIS — C519 Malignant neoplasm of vulva, unspecified: Secondary | ICD-10-CM

## 2020-10-18 DIAGNOSIS — Z08 Encounter for follow-up examination after completed treatment for malignant neoplasm: Secondary | ICD-10-CM | POA: Diagnosis not present

## 2020-10-18 NOTE — Progress Notes (Signed)
[name] Richards is here today for follow up post radiation to the pelvic.  They completed their radiation on: 10/12/2019   Does the patient complain of any of the following:  Pain:denies Abdominal bloating: denies Diarrhea/Constipation: denies Nausea/Vomiting: denies Vaginal Discharge: denies Blood in Urine or Stool: denies Urinary Issues (dysuria/incomplete emptying/ incontinence/ increased frequency/urgency): occasional incomplete bladder emptying Does patient report using vaginal dilator 2-3 times a week and/or sexually active 2-3 weeks: 2 times weekly   Additional comments if applicable: none  Vitals:   10/18/20 1532  BP: (!) 154/81  Pulse: 69  Resp: 20  Temp: 97.8 F (36.6 C)  SpO2: 100%  Weight: 154 lb 9.6 oz (70.1 kg)  Height: 5\' 5"  (1.651 m)

## 2020-10-19 ENCOUNTER — Telehealth: Payer: Self-pay | Admitting: *Deleted

## 2020-10-19 NOTE — Telephone Encounter (Signed)
CALLED PATIENT TO INFORM OF FU WITH DR. KINARD ON 04-22-21 @ 3PM AND FU WITH DR. Berline Lopes ON 01-22-21- ARRIVAL TIME- 2 PM, LVM FOR A RETURN CALL

## 2020-10-19 NOTE — Telephone Encounter (Signed)
Per Enid Derry from radiation, moved appt to the last week in Dec per patient request

## 2020-10-22 DIAGNOSIS — H35371 Puckering of macula, right eye: Secondary | ICD-10-CM | POA: Diagnosis not present

## 2020-12-11 DIAGNOSIS — H40003 Preglaucoma, unspecified, bilateral: Secondary | ICD-10-CM | POA: Diagnosis not present

## 2020-12-25 DIAGNOSIS — E785 Hyperlipidemia, unspecified: Secondary | ICD-10-CM | POA: Diagnosis not present

## 2020-12-25 DIAGNOSIS — I10 Essential (primary) hypertension: Secondary | ICD-10-CM | POA: Diagnosis not present

## 2020-12-25 DIAGNOSIS — R7303 Prediabetes: Secondary | ICD-10-CM | POA: Diagnosis not present

## 2020-12-25 DIAGNOSIS — E871 Hypo-osmolality and hyponatremia: Secondary | ICD-10-CM | POA: Diagnosis not present

## 2020-12-25 DIAGNOSIS — E039 Hypothyroidism, unspecified: Secondary | ICD-10-CM | POA: Diagnosis not present

## 2021-01-16 DIAGNOSIS — M1712 Unilateral primary osteoarthritis, left knee: Secondary | ICD-10-CM | POA: Diagnosis not present

## 2021-01-22 ENCOUNTER — Ambulatory Visit: Payer: Medicare Other | Admitting: Gynecologic Oncology

## 2021-01-24 DIAGNOSIS — G8929 Other chronic pain: Secondary | ICD-10-CM | POA: Diagnosis not present

## 2021-01-24 DIAGNOSIS — E785 Hyperlipidemia, unspecified: Secondary | ICD-10-CM | POA: Diagnosis not present

## 2021-01-24 DIAGNOSIS — I10 Essential (primary) hypertension: Secondary | ICD-10-CM | POA: Diagnosis not present

## 2021-01-24 DIAGNOSIS — E039 Hypothyroidism, unspecified: Secondary | ICD-10-CM | POA: Diagnosis not present

## 2021-02-05 NOTE — Progress Notes (Signed)
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  Treatment History: Oncology History Overview Note  Patient followed due to HX pulmonary nodules noted 2011  Primary cancer of left upper lobe of lung ([place])   Staging form: Lung, AJCC 7th Edition   - Clinical stage from 09/27/2015: Stage IA (T1b, N0, M0) - Signed by Curt Bears, MD on 09/27/2015    Primary cancer of left upper lobe of lung ([place])  07/12/2015 Imaging   CT Chest IMPRESSION:  The solid component of the large part solid nodule in the left upper lobe measures 9 x 3 mm (mean diameter 6 mm.) persistent part solid nodules with solid components greater than or equal to 6 mm should be considered highly suspicious for pulmonary adenocarcinoma   08/01/2015 Initial Diagnosis   Primary cancer of left upper lobe of lung ([place])   08/01/2015 Surgery   PROCEDURE:  Left video-assisted thoracoscopy,           Wedge resection,           Thoracoscopic lingular sparing left upper lobectomy           Lymph node dissection           On-Q local anesthetic catheter placement   08/01/2015 Pathology Results   Lung, resection (segmental or lobe), Left Upper Lobe - ADENOCARCINOMA, WELL DIFFERENTIATED, SPANNING 2.5 CM. - THE SURGICAL RESECTION MARGINS ARE NEGATIVE FOR CARCINOMA. 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 She underwent a total abdominal hysterectomy and bilateral salpingo-oophorectomy, pelvic and para-aortic lymphadenectomy. She received postoperative whole pelvis radiation therapy.  Her other primary gynecologic problem has been lichen sclerosus managed with when necessary clobetasol.   Beginning in 2013 the patient had intermittent episodes of partial small bowel obstruction most likely secondary to  radiation injury to the terminal ileum.   In December 2016 the patient was found to have a new primary squamous cell carcinoma the vulva undergoing a modified radical vulvectomy on 02/06/2015. She underwent a modified radical vulvectomy at [facility] on 02/06/2015. She was found to have a 3 cm vulvar lesion of the posterior vulva extending into the posterior vagina. A rhomboid flap was required to close the incision.  Final pathology showed some close surgical margins. However, the specimen margins were toward the time of surgery and it was Dr Mora Bellman impression that they were widely around the primary cancer. She discussed options with the patient and they agreed to monitor her closely but not recommend any radiation therapy to the vulva.   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  Depth of invasion was 4 mm all margins were negative.  Follow-up PET scan March 23, 2017 showed no evidence of metastatic disease.   Examination of the vulva during routine surveillance on 07/20/19 showed a friable, verrucous lesion measuring approximately 2 cm was seen on the anterior wall of the suburethral vagina extending to the introitus and wrapping around to the right mid vagina. This was concerning for recurrence and was biopsied at that appointment. 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    On 08/11/19 she underwent total vaginectomy. Intraoperative findings were significant for a forshortened 3cm vagina, friable 4cm plaque on the anterior vagina  extending to the right fornix and left posterior vaginal wall. This necessitated a total vaginectomy to resect the lesion grossly. Her tissues were extremely friable due to age, prior radiation, menopause and lichen sclerosis. There was unavoidable fragmentation of the tissues during the resection. All gross visible disease was removed. Surgery was uncomplicated, and she  reported no postop pain.  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 The suspicious foci are present at the edges of tissue fragments.  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    Post-op PET scan in August, 2021 showed no metastatic disease.  The patient received adjuvant vaginal brachytherapy between 09/29/2019 through 10/12/2019.  She tolerated treatment very well with no toxicities.   Interval History: Last saw Dr. Sondra Come on 9/8.  Presents today noting overall doing well.  She denies any vaginal bleeding or discharge.  Has intermittent symptoms related to her lichen sclerosis, does not use treatment as she does not tolerate clobetasol.  Reports normal bowel and bladder function.  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 S/p TAH and radiation x6 wks    Complication of anesthesia    took 2 days to wake up after 1 surgery at 20, pt can't use a adult cath,needs peds. cath due to past injury   Constipation    very worried about this   Endometrial cancer [facility]) 1997   coincidental endometrial cancer also   History of radiation therapy 10/12/2019   [facility] HDR brachytherapy  09/29/2019-10/12/2019  Dr Gery Pray   Hyperlipemia    Hypertension    Hypothyroidism    Primary cancer of left upper lobe of lung ([place]) 08/01/2015   Small bowel obstruction ([place])    12/2008, repeat SBO in 02/2011 also with terminal ilietis    Thyroid disease    Vertebral body hemangioma    T12    Past Surgical History:  Procedure Laterality Date   ABDOMINAL HYSTERECTOMY  Feb 1997   TAH/BSO, pelvic and  periaortic lymphadenectomy    BACK SURGERY     EYE SURGERY     Lasik    LYMPH NODE DISSECTION Left 08/01/2015   Procedure: LYMPH NODE DISSECTION;  Surgeon: Melrose Nakayama, MD;  Location: [place];  Service: Thoracic;  Laterality: Left;   PARTIAL NEPHRECTOMY     right   SEGMENTECOMY Left 08/01/2015   Procedure: Left Upper Lobe SEGMENTECTOMY;  Surgeon: Melrose Nakayama, MD;  Location: [place];  Service: Thoracic;  Laterality: Left;   SKIN BIOPSY     TUBAL LIGATION     TUMOR REMOVAL     meningioma  T 12   VAGINECTOMY, PARTIAL N/A 08/11/2019   Procedure: TOTAL VAGINECTOMY;  Surgeon: Everitt Amber, MD;  Location: [facility];  Service: Gynecology;  Laterality: N/A;   VIDEO ASSISTED THORACOSCOPY (VATS)/WEDGE RESECTION Left 08/01/2015   Procedure: VIDEO ASSISTED THORACOSCOPY (VATS)/WEDGE RESECTION;  Surgeon: Melrose Nakayama, MD;  Location: [place];  Service: Thoracic;  Laterality: Left;   VULVA SURGERY     x 2, 1 at [facility] (2016), and [facility]     Family History  Problem Relation Age of Onset   Hypertension Other  Diabetes Other    Stroke Other    CAD Other     Social History   Socioeconomic History   Marital status: Divorced    Spouse name: Not on file   Number of children: Not on file   Years of education: Not on file   Highest education level: Not on file  Occupational History   Not on file  Tobacco Use   Smoking status: Never   Smokeless tobacco: Never  Vaping Use   Vaping Use: Never used  Substance and Sexual Activity   Alcohol use: No   Drug use: No   Sexual activity: Never  Other Topics Concern   Not on file  Social History Narrative   Lives at home alone, works out at [employer] 6 days a week, still very active. Has an ex-husband who she is in contact with. Son died of an MI and another son in [place].    Social Determinants of Health   Financial Resource Strain: Not on file  Food Insecurity: Not on file  Transportation Needs: Not on file  Physical  Activity: Not on file  Stress: Not on file  Social Connections: Not on file    Current Medications:  Current Outpatient Medications:    amLODipine (NORVASC) 5 MG tablet, SMARTSIG:2 Tablet(s) By Mouth Every Evening, Disp: , Rfl:    benazepril-hydrochlorthiazide (LOTENSIN HCT) 20-25 MG tablet, Take 1 tablet by mouth daily. , Disp: , Rfl:    BIOTIN PO, Take 1 tablet by mouth 4 (four) times a week., Disp: , Rfl:    Cholecalciferol (VITAMIN D3 PO), Take 1 tablet by mouth in the morning and at bedtime., Disp: , Rfl:    docusate sodium (COLACE) 100 MG capsule, Take 100 mg by mouth 2 (two) times daily., Disp: , Rfl:    levothyroxine (SYNTHROID, LEVOTHROID) 75 MCG tablet, Take 75 mcg by mouth daily., Disp: , Rfl:    MAGNESIUM PO, Take 1 tablet by mouth daily., Disp: , Rfl:    Multiple Vitamin (MULTIVITAMIN WITH MINERALS) TABS tablet, Take 1 tablet by mouth daily., Disp: , Rfl:    Omega-3 Fatty Acids (FISH OIL PO), Take 1 capsule by mouth daily., Disp: , Rfl:    polyethylene glycol (MIRALAX / GLYCOLAX) packet, Take 11.3333 g by mouth at bedtime. 2/3 capful, Disp: , Rfl:    simvastatin (ZOCOR) 20 MG tablet, Take 20 mg by mouth at bedtime. , Disp: , Rfl:    amLODipine (NORVASC) 2.5 MG tablet, Take 2.5 mg by mouth daily.  (Patient not taking: Reported on 02/06/2021), Disp: , Rfl:    clobetasol (TEMOVATE) 0.05 % GEL, clobetasol 0.05 % topical gel  APPLY TO ORAL AREAS TWICE DAILY (Patient not taking: Reported on 02/06/2021), Disp: , Rfl:    Fexofenadine HCl (ALLEGRA PO), Take by mouth daily. (Patient not taking: Reported on 02/06/2021), Disp: , Rfl:   Review of Systems: Denies appetite changes, fevers, chills, fatigue, unexplained weight changes. Denies hearing loss, neck lumps or masses, mouth sores, ringing in ears or voice changes. Denies cough or wheezing.  Denies shortness of breath. Denies chest pain or palpitations. Denies leg swelling. Denies abdominal distention, pain, blood in stools,  constipation, diarrhea, nausea, vomiting, or early satiety. Denies pain with intercourse, dysuria, frequency, hematuria or incontinence. Denies hot flashes, pelvic pain, vaginal bleeding or vaginal discharge.   Denies joint pain, back pain or muscle pain/cramps. Denies itching, rash, or wounds. Denies dizziness, headaches, numbness or seizures. Denies swollen lymph nodes or glands, denies  easy bruising or bleeding. Denies anxiety, depression, confusion, or decreased concentration.  Physical Exam: BP (!) 179/76 (BP Location: Right Arm, Patient Position: Sitting)    Pulse 69    Temp (!) 97.3 F (36.3 C) (Tympanic)    Resp 16    Ht 5\' 5"  (1.651 m)    Wt 156 lb 14.4 oz (71.2 kg)    SpO2 100%    BMI 26.11 kg/m  General: Alert, oriented, no acute distress. HEENT: Normocephalic, atraumatic, sclera anicteric. Chest: Clear to auscultation bilaterally.  No wheezes or rhonchi. Cardiovascular: Regular rate and rhythm, no murmurs. Abdomen: soft, nontender.  Normoactive bowel sounds.  No masses or hepatosplenomegaly appreciated.   Extremities: Grossly normal range of motion.  Warm, well perfused.  No edema bilaterally. Skin: No rashes or lesions noted. Lymphatics: No cervical, supraclavicular, or inguinal adenopathy. GU: There is significant loss of architecture of the vulva.  Desquamative area on bilateral inner labia, greater on the left than the right, mildly erythematous.  Vaginal length is approximately 2 cm and can be visualized without a speculum.  Patient has significant tenderness with palpation of her labia.  There appears to be hyperkeratosis along the posterior aspect of the vagina.  No discrete masses seen and no nodularity felt within the vagina.  Laboratory & Radiologic Studies: None new  Assessment & Plan: [name] Richards is a 75 y.o. woman with a history of recurrent vulvar cancer s/p incomplete vaginal resection with total vaginectomy 08/11/19 for high-grade vaginal dysplasia (foci  concerning for microscopic invasive disease, margins positive). S/p adjuvant vaginal brachytherapy for positive margins, completed September 2021.  Complete clinical response.   Patient presents today for surveillance visit.  She has evidence of lichen sclerosis on exam.  There are some changes along the posterior aspect of the vagina that I suspect are related to radiation changes.  We discussed performing vaginoscopy and vulvoscopy today as well as biopsies.  Overall, my impression is that this is not recurrent malignancy.    Given her significant tenderness, I also offered that we could do this under light anesthesia in the outpatient surgical center.  This was her preference.  We will plan to do this in early January.  We will plan for exam under anesthesia, vulvar and vaginal biopsies.  Perioperative instructions were reviewed with the patient today.  32 minutes of total time was spent for this patient encounter, including preparation, face-to-face counseling with the patient and coordination of care, and documentation of the encounter.  Jeral Pinch, MD  Division of Gynecologic Oncology  Department of Obstetrics and Gynecology  [facility]

## 2021-02-05 NOTE — H&P (View-Only) (Signed)
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  Treatment History: Oncology History Overview Note  Patient followed due to HX pulmonary nodules noted 2011  Primary cancer of left upper lobe of lung ([place])   Staging form: Lung, AJCC 7th Edition   - Clinical stage from 09/27/2015: Stage IA (T1b, N0, M0) - Signed by Curt Bears, MD on 09/27/2015    Primary cancer of left upper lobe of lung ([place])  07/12/2015 Imaging   CT Chest IMPRESSION:  The solid component of the large part solid nodule in the left upper lobe measures 9 x 3 mm (mean diameter 6 mm.) persistent part solid nodules with solid components greater than or equal to 6 mm should be considered highly suspicious for pulmonary adenocarcinoma   08/01/2015 Initial Diagnosis   Primary cancer of left upper lobe of lung ([place])   08/01/2015 Surgery   PROCEDURE:  Left video-assisted thoracoscopy,           Wedge resection,           Thoracoscopic lingular sparing left upper lobectomy           Lymph node dissection           On-Q local anesthetic catheter placement   08/01/2015 Pathology Results   Lung, resection (segmental or lobe), Left Upper Lobe - ADENOCARCINOMA, WELL DIFFERENTIATED, SPANNING 2.5 CM. - THE SURGICAL RESECTION MARGINS ARE NEGATIVE FOR CARCINOMA. 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 She underwent a total abdominal hysterectomy and bilateral salpingo-oophorectomy, pelvic and para-aortic lymphadenectomy. She received postoperative whole pelvis radiation therapy.  Her other primary gynecologic problem has been lichen sclerosus managed with when necessary clobetasol.   Beginning in 2013 the patient had intermittent episodes of partial small bowel obstruction most likely secondary to  radiation injury to the terminal ileum.   In December 2016 the patient was found to have a new primary squamous cell carcinoma the vulva undergoing a modified radical vulvectomy on 02/06/2015. She underwent a modified radical vulvectomy at [facility] on 02/06/2015. She was found to have a 3 cm vulvar lesion of the posterior vulva extending into the posterior vagina. A rhomboid flap was required to close the incision.  Final pathology showed some close surgical margins. However, the specimen margins were toward the time of surgery and it was Dr Mora Bellman impression that they were widely around the primary cancer. She discussed options with the patient and they agreed to monitor her closely but not recommend any radiation therapy to the vulva.   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  Depth of invasion was 4 mm all margins were negative.  Follow-up PET scan March 23, 2017 showed no evidence of metastatic disease.   Examination of the vulva during routine surveillance on 07/20/19 showed a friable, verrucous lesion measuring approximately 2 cm was seen on the anterior wall of the suburethral vagina extending to the introitus and wrapping around to the right mid vagina. This was concerning for recurrence and was biopsied at that appointment. 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    On 08/11/19 she underwent total vaginectomy. Intraoperative findings were significant for a forshortened 3cm vagina, friable 4cm plaque on the anterior vagina  extending to the right fornix and left posterior vaginal wall. This necessitated a total vaginectomy to resect the lesion grossly. Her tissues were extremely friable due to age, prior radiation, menopause and lichen sclerosis. There was unavoidable fragmentation of the tissues during the resection. All gross visible disease was removed. Surgery was uncomplicated, and she  reported no postop pain.  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 The suspicious foci are present at the edges of tissue fragments.  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    Post-op PET scan in August, 2021 showed no metastatic disease.  The patient received adjuvant vaginal brachytherapy between 09/29/2019 through 10/12/2019.  She tolerated treatment very well with no toxicities.   Interval History: Last saw Dr. Sondra Come on 9/8.  Presents today noting overall doing well.  She denies any vaginal bleeding or discharge.  Has intermittent symptoms related to her lichen sclerosis, does not use treatment as she does not tolerate clobetasol.  Reports normal bowel and bladder function.  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 S/p TAH and radiation x6 wks    Complication of anesthesia    took 2 days to wake up after 1 surgery at 20, pt can't use a adult cath,needs peds. cath due to past injury   Constipation    very worried about this   Endometrial cancer [facility]) 1997   coincidental endometrial cancer also   History of radiation therapy 10/12/2019   [facility] HDR brachytherapy  09/29/2019-10/12/2019  Dr Gery Pray   Hyperlipemia    Hypertension    Hypothyroidism    Primary cancer of left upper lobe of lung ([place]) 08/01/2015   Small bowel obstruction ([place])    12/2008, repeat SBO in 02/2011 also with terminal ilietis    Thyroid disease    Vertebral body hemangioma    T12    Past Surgical History:  Procedure Laterality Date   ABDOMINAL HYSTERECTOMY  Feb 1997   TAH/BSO, pelvic and  periaortic lymphadenectomy    BACK SURGERY     EYE SURGERY     Lasik    LYMPH NODE DISSECTION Left 08/01/2015   Procedure: LYMPH NODE DISSECTION;  Surgeon: Melrose Nakayama, MD;  Location: [place];  Service: Thoracic;  Laterality: Left;   PARTIAL NEPHRECTOMY     right   SEGMENTECOMY Left 08/01/2015   Procedure: Left Upper Lobe SEGMENTECTOMY;  Surgeon: [place] Nakayama, MD;  Location: [place];  Service: Thoracic;  Laterality: Left;   SKIN BIOPSY     TUBAL LIGATION     TUMOR REMOVAL     meningioma  T 12   VAGINECTOMY, PARTIAL N/A 08/11/2019   Procedure: TOTAL VAGINECTOMY;  Surgeon: Everitt Amber, MD;  Location: [facility];  Service: Gynecology;  Laterality: N/A;   VIDEO ASSISTED THORACOSCOPY (VATS)/WEDGE RESECTION Left 08/01/2015   Procedure: VIDEO ASSISTED THORACOSCOPY (VATS)/WEDGE RESECTION;  Surgeon: Mel[place] Nakayama, MD;  Location: [place];  Service: Thoracic;  Laterality: Left;   VULVA SURGERY     x 2, 1 at [facility] ([facility]), and [facility]     Family History  Problem Relation Age of Onset   Hypertension Other  Diabetes Other    Stroke Other    CAD Other     Social History   Socioeconomic History   Marital status: Divorced    Spouse name: Not on file   Number of children: Not on file   Years of education: Not on file   Highest education level: Not on file  Occupational History   Not on file  Tobacco Use   Smoking status: Never   Smokeless tobacco: Never  Vaping Use   Vaping Use: Never used  Substance and Sexual Activity   Alcohol use: No   Drug use: No   Sexual activity: Never  Other Topics Concern   Not on file  Social History Narrative   Lives at home alone, works out at [employer] 6 days a week, still very active. Has an ex-husband who she is in contact with. Son died of an MI and another son in [place].    Social Determinants of Health   Financial Resource Strain: Not on file  Food Insecurity: Not on file  Transportation Needs: Not on file  Physical  Activity: Not on file  Stress: Not on file  Social Connections: Not on file    Current Medications:  Current Outpatient Medications:    amLODipine (NORVASC) 5 MG tablet, SMARTSIG:2 Tablet(s) By Mouth Every Evening, Disp: , Rfl:    benazepril-hydrochlorthiazide (LOTENSIN HCT) 20-25 MG tablet, Take 1 tablet by mouth daily. , Disp: , Rfl:    BIOTIN PO, Take 1 tablet by mouth 4 (four) times a week., Disp: , Rfl:    Cholecalciferol (VITAMIN D3 PO), Take 1 tablet by mouth in the morning and at bedtime., Disp: , Rfl:    docusate sodium (COLACE) 100 MG capsule, Take 100 mg by mouth 2 (two) times daily., Disp: , Rfl:    levothyroxine (SYNTHROID, LEVOTHROID) 75 MCG tablet, Take 75 mcg by mouth daily., Disp: , Rfl:    MAGNESIUM PO, Take 1 tablet by mouth daily., Disp: , Rfl:    Multiple Vitamin (MULTIVITAMIN WITH MINERALS) TABS tablet, Take 1 tablet by mouth daily., Disp: , Rfl:    Omega-3 Fatty Acids (FISH OIL PO), Take 1 capsule by mouth daily., Disp: , Rfl:    polyethylene glycol (MIRALAX / GLYCOLAX) packet, Take 11.3333 g by mouth at bedtime. 2/3 capful, Disp: , Rfl:    simvastatin (ZOCOR) 20 MG tablet, Take 20 mg by mouth at bedtime. , Disp: , Rfl:    amLODipine (NORVASC) 2.5 MG tablet, Take 2.5 mg by mouth daily.  (Patient not taking: Reported on 02/06/2021), Disp: , Rfl:    clobetasol (TEMOVATE) 0.05 % GEL, clobetasol 0.05 % topical gel  APPLY TO ORAL AREAS TWICE DAILY (Patient not taking: Reported on 02/06/2021), Disp: , Rfl:    Fexofenadine HCl (ALLEGRA PO), Take by mouth daily. (Patient not taking: Reported on 02/06/2021), Disp: , Rfl:   Review of Systems: Denies appetite changes, fevers, chills, fatigue, unexplained weight changes. Denies hearing loss, neck lumps or masses, mouth sores, ringing in ears or voice changes. Denies cough or wheezing.  Denies shortness of breath. Denies chest pain or palpitations. Denies leg swelling. Denies abdominal distention, pain, blood in stools,  constipation, diarrhea, nausea, vomiting, or early satiety. Denies pain with intercourse, dysuria, frequency, hematuria or incontinence. Denies hot flashes, pelvic pain, vaginal bleeding or vaginal discharge.   Denies joint pain, back pain or muscle pain/cramps. Denies itching, rash, or wounds. Denies dizziness, headaches, numbness or seizures. Denies swollen lymph nodes or glands, denies  easy bruising or bleeding. Denies anxiety, depression, confusion, or decreased concentration.  Physical Exam: BP (!) 179/76 (BP Location: Right Arm, Patient Position: Sitting)    Pulse 69    Temp (!) 97.3 F (36.3 C) (Tympanic)    Resp 16    Ht 5\' 5"  (1.651 m)    Wt 156 lb 14.4 oz (71.2 kg)    SpO2 100%    BMI 26.11 kg/m  General: Alert, oriented, no acute distress. HEENT: Normocephalic, atraumatic, sclera anicteric. Chest: Clear to auscultation bilaterally.  No wheezes or rhonchi. Cardiovascular: Regular rate and rhythm, no murmurs. Abdomen: soft, nontender.  Normoactive bowel sounds.  No masses or hepatosplenomegaly appreciated.   Extremities: Grossly normal range of motion.  Warm, well perfused.  No edema bilaterally. Skin: No rashes or lesions noted. Lymphatics: No cervical, supraclavicular, or inguinal adenopathy. GU: There is significant loss of architecture of the vulva.  Desquamative area on bilateral inner labia, greater on the left than the right, mildly erythematous.  Vaginal length is approximately 2 cm and can be visualized without a speculum.  Patient has significant tenderness with palpation of her labia.  There appears to be hyperkeratosis along the posterior aspect of the vagina.  No discrete masses seen and no nodularity felt within the vagina.  Laboratory & Radiologic Studies: None new  Assessment & Plan: [name] Richards is a 75 y.o. woman with a history of recurrent vulvar cancer s/p incomplete vaginal resection with total vaginectomy 08/11/19 for high-grade vaginal dysplasia (foci  concerning for microscopic invasive disease, margins positive). S/p adjuvant vaginal brachytherapy for positive margins, completed September 2021.  Complete clinical response.   Patient presents today for surveillance visit.  She has evidence of lichen sclerosis on exam.  There are some changes along the posterior aspect of the vagina that I suspect are related to radiation changes.  We discussed performing vaginoscopy and vulvoscopy today as well as biopsies.  Overall, my impression is that this is not recurrent malignancy.    Given her significant tenderness, I also offered that we could do this under light anesthesia in the outpatient surgical center.  This was her preference.  We will plan to do this in early January.  We will plan for exam under anesthesia, vulvar and vaginal biopsies.  Perioperative instructions were reviewed with the patient today.  32 minutes of total time was spent for this patient encounter, including preparation, face-to-face counseling with the patient and coordination of care, and documentation of the encounter.  Jeral Pinch, MD  Division of Gynecologic Oncology  Department of Obstetrics and Gynecology  [facility]

## 2021-02-06 ENCOUNTER — Inpatient Hospital Stay: Payer: Medicare Other | Attending: Gynecologic Oncology | Admitting: Gynecologic Oncology

## 2021-02-06 ENCOUNTER — Encounter: Payer: Self-pay | Admitting: Gynecologic Oncology

## 2021-02-06 ENCOUNTER — Other Ambulatory Visit: Payer: Self-pay

## 2021-02-06 ENCOUNTER — Inpatient Hospital Stay (HOSPITAL_BASED_OUTPATIENT_CLINIC_OR_DEPARTMENT_OTHER): Payer: Medicare Other | Admitting: Gynecologic Oncology

## 2021-02-06 VITALS — BP 179/76 | HR 69 | Temp 97.3°F | Resp 16 | Ht 65.0 in | Wt 156.9 lb

## 2021-02-06 DIAGNOSIS — E039 Hypothyroidism, unspecified: Secondary | ICD-10-CM | POA: Insufficient documentation

## 2021-02-06 DIAGNOSIS — Z8541 Personal history of malignant neoplasm of cervix uteri: Secondary | ICD-10-CM | POA: Insufficient documentation

## 2021-02-06 DIAGNOSIS — L900 Lichen sclerosus et atrophicus: Secondary | ICD-10-CM

## 2021-02-06 DIAGNOSIS — E785 Hyperlipidemia, unspecified: Secondary | ICD-10-CM | POA: Diagnosis not present

## 2021-02-06 DIAGNOSIS — Z79899 Other long term (current) drug therapy: Secondary | ICD-10-CM | POA: Diagnosis not present

## 2021-02-06 DIAGNOSIS — Z9079 Acquired absence of other genital organ(s): Secondary | ICD-10-CM | POA: Diagnosis not present

## 2021-02-06 DIAGNOSIS — Z8542 Personal history of malignant neoplasm of other parts of uterus: Secondary | ICD-10-CM | POA: Insufficient documentation

## 2021-02-06 DIAGNOSIS — Z923 Personal history of irradiation: Secondary | ICD-10-CM | POA: Insufficient documentation

## 2021-02-06 DIAGNOSIS — I10 Essential (primary) hypertension: Secondary | ICD-10-CM | POA: Diagnosis not present

## 2021-02-06 DIAGNOSIS — C519 Malignant neoplasm of vulva, unspecified: Secondary | ICD-10-CM

## 2021-02-06 DIAGNOSIS — Z90722 Acquired absence of ovaries, bilateral: Secondary | ICD-10-CM | POA: Diagnosis not present

## 2021-02-06 DIAGNOSIS — Z90710 Acquired absence of both cervix and uterus: Secondary | ICD-10-CM | POA: Insufficient documentation

## 2021-02-06 NOTE — Patient Instructions (Addendum)
Preparing for your Surgery  Plan for surgery on February 14, 2021 with Dr. Jeral Pinch at [facility]. You will be scheduled for examination under anesthesia, vulvoscopy, vaginal and vulvar biopsies.   We recommend purchasing several bags of frozen green peas and dividing them into ziploc bags. You will want to keep these in the freezer and have them ready to use as ice packs to the vulvar incision. Once the ice pack is no longer cold, you can get another from the freezer. The frozen peas mold to your body better than a regular ice pack.   Pre-operative Testing -You will receive a phone call from presurgical testing at [facility] to discuss surgery instructions and arrange for lab work if needed.  -Bring your insurance card, copy of an advanced directive if applicable, medication list.  -You should not be taking blood thinners or aspirin at least ten days prior to surgery unless instructed by your surgeon.  -Do not take supplements such as fish oil (omega 3), red yeast rice, turmeric before your surgery. You want to avoid medications with aspirin in them including headache powders such as BC or Goody's), Excedrin migraine.  Day Before Surgery at [place] will be advised you can have clear liquids up until 3 hours before your surgery.    Your role in recovery Your role is to become active as soon as directed by your doctor, while still giving yourself time to heal.  Rest when you feel tired. You will be asked to do the following in order to speed your recovery:  - Cough and breathe deeply. This helps to clear and expand your lungs and can prevent pneumonia after surgery.  - [place]. Do mild physical activity. Walking or moving your legs help your circulation and body functions return to normal. Do not try to get up or walk alone the first time after surgery.   -If you develop swelling on one leg or the other, pain in the back  of your leg, redness/warmth in one of your legs, please call the office or go to the Emergency Room to have a doppler to rule out a blood clot. For shortness of breath, chest pain-seek care in the Emergency Room as soon as possible. - Actively manage your pain. Managing your pain lets you move in comfort. We will ask you to rate your pain on a scale of zero to 10. It is your responsibility to tell your doctor or nurse where and how much you hurt so your pain can be treated.  Special Considerations -Your final pathology results from surgery should be available around one week after surgery and the results will be relayed to you when available.  -FMLA forms can be faxed to [phone] and please allow 5-7 business days for completion.  Pain Management After Surgery -Make sure that you have Tylenol and Ibuprofen at home to use on a regular basis after surgery for pain control. We recommend alternating the medications every hour to six hours since they work differently and are processed in the body differently for pain relief.  Bowel Regimen - It is important to prevent constipation and drink adequate amounts of liquids.   Risks of Surgery Risks of surgery are low but include bleeding, infection, damage to surrounding structures, re-operation, blood clots, and very rarely death.  AFTER SURGERY INSTRUCTIONS  Return to work:  1-2 weeks if applicable  Activity: 1. Be up and out of the  bed during the day.  Take a nap if needed.  You may walk up steps but be careful and use the hand rail.  Stair climbing will tire you more than you think, you may need to stop part way and rest.   2. No lifting or straining for 2 weeks over 10 pounds. No pushing, pulling, straining for 2 weeks.  3. No driving for minimum 24 hours after surgery.  Do not drive if you are taking narcotic pain medicine and make sure that your reaction time has returned.   4. You can shower as soon as the next day after surgery. Shower  daily. No tub baths or submerging your body in water until cleared by your surgeon. If you have the soap that was given to you by pre-surgical testing that was used before surgery, you do not need to use it afterwards because this can irritate your incisions.   5. No sexual activity and nothing in the vagina for 4 weeks.  6. You may experience vaginal/vulvar spotting and discharge after surgery.  The spotting is normal but if you experience heavy bleeding, call our office.  7. Take Tylenol or ibuprofen first for pain.  Monitor your Tylenol intake to a max of 4,000 mg in a 24 hour period.  Diet: 1. Low sodium Heart Healthy Diet is recommended but you are cleared to resume your normal (before surgery) diet after your procedure.  2. It is safe to use a laxative, such as Miralax or Colace, if you have difficulty moving your bowels.   Wound Care: 1. Keep clean and dry.  Shower daily.  Reasons to call the Doctor: Fever - Oral temperature greater than 100.4 degrees Fahrenheit Foul-smelling vaginal discharge Difficulty urinating Nausea and vomiting Increased pain at the site of the incision that is unrelieved with pain medicine. Difficulty breathing with or without chest pain New calf pain especially if only on one side Sudden, continuing increased vaginal bleeding with or without clots.   Contacts: For questions or concerns you should contact:  Dr. Jeral Pinch at [phone]  Joylene John, NP at [phone]  After Hours: call [phone] and have the GYN Oncologist paged/contacted (after 5 pm or on the weekends).  Messages sent via mychart are for non-urgent matters and are not responded to after hours so for urgent needs, please call the after hours number.

## 2021-02-06 NOTE — Patient Instructions (Signed)
Preparing for your Surgery   Plan for surgery on February 14, 2021 with Dr. Jeral Pinch at [facility]. You will be scheduled for examination under anesthesia, vulvoscopy, vaginal and vulvar biopsies.    We recommend purchasing several bags of frozen green peas and dividing them into ziploc bags. You will want to keep these in the freezer and have them ready to use as ice packs to the vulvar incision. Once the ice pack is no longer cold, you can get another from the freezer. The frozen peas mold to your body better than a regular ice pack.    Pre-operative Testing -You will receive a phone call from presurgical testing at [facility] to discuss surgery instructions and arrange for lab work if needed.   -Bring your insurance card, copy of an advanced directive if applicable, medication list.   -You should not be taking blood thinners or aspirin at least ten days prior to surgery unless instructed by your surgeon.   -Do not take supplements such as fish oil (omega 3), red yeast rice, turmeric before your surgery. You want to avoid medications with aspirin in them including headache powders such as BC or Goody's), Excedrin migraine.   Day Before Surgery at [facility] will be advised you can have clear liquids up until 3 hours before your surgery.     Your role in recovery Your role is to become active as soon as directed by your doctor, while still giving yourself time to heal.  Rest when you feel tired. You will be asked to do the following in order to speed your recovery:   - Cough and breathe deeply. This helps to clear and expand your lungs and can prevent pneumonia after surgery.  - [place]. Do mild physical activity. Walking or moving your legs help your circulation and body functions return to normal. Do not try to get up or walk alone the first time after surgery.   -If you develop swelling on one leg or the other, pain in  the back of your leg, redness/warmth in one of your legs, please call the office or go to the Emergency Room to have a doppler to rule out a blood clot. For shortness of breath, chest pain-seek care in the Emergency Room as soon as possible. - Actively manage your pain. Managing your pain lets you move in comfort. We will ask you to rate your pain on a scale of zero to 10. It is your responsibility to tell your doctor or nurse where and how much you hurt so your pain can be treated.   Special Considerations -Your final pathology results from surgery should be available around one week after surgery and the results will be relayed to you when available.   -FMLA forms can be faxed to [phone] and please allow 5-7 business days for completion.   Pain Management After Surgery -Make sure that you have Tylenol and Ibuprofen at home to use on a regular basis after surgery for pain control. We recommend alternating the medications every hour to six hours since they work differently and are processed in the body differently for pain relief.   Bowel Regimen - It is important to prevent constipation and drink adequate amounts of liquids.    Risks of Surgery Risks of surgery are low but include bleeding, infection, damage to surrounding structures, re-operation, blood clots, and very rarely death.   AFTER SURGERY INSTRUCTIONS   Return  to work:  1-2 weeks if applicable   Activity: 1. Be up and out of the bed during the day.  Take a nap if needed.  You may walk up steps but be careful and use the hand rail.  Stair climbing will tire you more than you think, you may need to stop part way and rest.    2. No lifting or straining for 2 weeks over 10 pounds. No pushing, pulling, straining for 2 weeks.   3. No driving for minimum 24 hours after surgery.  Do not drive if you are taking narcotic pain medicine and make sure that your reaction time has returned.    4. You can shower as soon as the next day  after surgery. Shower daily. No tub baths or submerging your body in water until cleared by your surgeon. If you have the soap that was given to you by pre-surgical testing that was used before surgery, you do not need to use it afterwards because this can irritate your incisions.    5. No sexual activity and nothing in the vagina for 4 weeks.   6. You may experience vaginal/vulvar spotting and discharge after surgery.  The spotting is normal but if you experience heavy bleeding, call our office.   7. Take Tylenol or ibuprofen first for pain.  Monitor your Tylenol intake to a max of 4,000 mg in a 24 hour period.   Diet: 1. Low sodium Heart Healthy Diet is recommended but you are cleared to resume your normal (before surgery) diet after your procedure.   2. It is safe to use a laxative, such as Miralax or Colace, if you have difficulty moving your bowels.    Wound Care: 1. Keep clean and dry.  Shower daily.   Reasons to call the Doctor: Fever - Oral temperature greater than 100.4 degrees Fahrenheit Foul-smelling vaginal discharge Difficulty urinating Nausea and vomiting Increased pain at the site of the incision that is unrelieved with pain medicine. Difficulty breathing with or without chest pain New calf pain especially if only on one side Sudden, continuing increased vaginal bleeding with or without clots.   Contacts: For questions or concerns you should contact:   Dr. Jeral Pinch at [phone]   Joylene John, NP at [phone]   After Hours: call [phone] and have the GYN Oncologist paged/contacted (after 5 pm or on the weekends).   Messages sent via mychart are for non-urgent matters and are not responded to after hours so for urgent needs, please call the after hours number.

## 2021-02-06 NOTE — Progress Notes (Signed)
Patient here for follow up with Dr. Jeral Pinch and for a pre-operative discussion prior to her scheduled surgery on February 14, 2021. She is scheduled for examination under anesthesia, vulvoscopy, vaginal and vulvar biopsies. The surgery was discussed in detail.  See after visit summary for additional details.       Discussed post-op pain management in detail including the aspects of the enhanced recovery pathway. We discussed the use of tylenol post-op if needed and to monitor for a maximum of 4,000 mg in a 24 hour period. Discussed bowel regimen and she will plan to continue her home regimen of Miralax.    5 minutes spent with the patient.  Verbalizing understanding of material discussed. No needs or concerns voiced at the end of the visit. Advised patient to call for any needs.    This appointment is included in the global surgical bundle as pre-operative teaching and has no charge.

## 2021-02-07 ENCOUNTER — Other Ambulatory Visit: Payer: Self-pay

## 2021-02-07 ENCOUNTER — Encounter (HOSPITAL_BASED_OUTPATIENT_CLINIC_OR_DEPARTMENT_OTHER): Payer: Self-pay | Admitting: Gynecologic Oncology

## 2021-02-07 NOTE — Progress Notes (Addendum)
Spoke w/ via phone for pre-op interview---PT Lab needs dos---- I STAT, EKG              Lab results------none COVID test -----02-12-2021 travel outside of [place] in last 30 days Arrive at -------1100 am 02-15-2020 NPO after MN NO Solid Food.  Clear liquids from MN until---1000 am  Med rec completed Medications to take morning of surgery -----levothyroxine, claritin or allegra prn Diabetic medication -----n/a Patient instructed no nail polish to be worn day of surgery Patient instructed to bring photo id and insurance card day of surgery Patient aware to have Driver (ride ) / caregiver    for 24 hours after surgery friend/caregiver brenda  Patient Special Instructions -----none Pre-Op special Istructions -----none Patient verbalized understanding of instructions that were given at this phone interview. Patient denies shortness of breath, chest pain, fever, cough at this phone interview.

## 2021-02-12 ENCOUNTER — Other Ambulatory Visit: Payer: Self-pay | Admitting: Gynecologic Oncology

## 2021-02-12 LAB — SARS CORONAVIRUS 2 (TAT 6-24 HRS): SARS Coronavirus 2: NEGATIVE

## 2021-02-13 ENCOUNTER — Telehealth: Payer: Self-pay

## 2021-02-13 NOTE — Telephone Encounter (Signed)
Spoke with [name] Richards this afternoon. Checking in with patient pre-operatively. Patient compliant with pre-operative instructions.  Reinforced nothing to eat after midnight. Clear liquids until 10am. Patient to arrive at 11am.    Patient requesting a pediatric foley catheter be used during her procedure. Patient states this is necessary due to prior surgeries and radiation. Dr. Berline Lopes notified.   Patient inquiring if she can use summers eve wash after surgery. Advised patient to avoid use as it can be irritating to incisions. Patient verbalized understanding.   Patient states she would like to return to the gym before 2 weeks after surgery and will speak with Dr. Berline Lopes about this tomorrow.   Instructed to call with any questions or concerns.

## 2021-02-14 ENCOUNTER — Ambulatory Visit (HOSPITAL_BASED_OUTPATIENT_CLINIC_OR_DEPARTMENT_OTHER): Payer: Medicare Other | Admitting: Anesthesiology

## 2021-02-14 ENCOUNTER — Other Ambulatory Visit: Payer: Self-pay

## 2021-02-14 ENCOUNTER — Ambulatory Visit (HOSPITAL_BASED_OUTPATIENT_CLINIC_OR_DEPARTMENT_OTHER)
Admission: RE | Admit: 2021-02-14 | Discharge: 2021-02-14 | Disposition: A | Discharge status: Home / Self Care | Payer: Medicare Other | Attending: Gynecologic Oncology | Admitting: Gynecologic Oncology

## 2021-02-14 ENCOUNTER — Encounter (HOSPITAL_BASED_OUTPATIENT_CLINIC_OR_DEPARTMENT_OTHER): Payer: Self-pay | Admitting: Gynecologic Oncology

## 2021-02-14 ENCOUNTER — Encounter (HOSPITAL_BASED_OUTPATIENT_CLINIC_OR_DEPARTMENT_OTHER)
Admission: RE | Disposition: A | Discharge status: Home / Self Care | Payer: Self-pay | Source: Home / Self Care | Attending: Gynecologic Oncology

## 2021-02-14 DIAGNOSIS — Z79899 Other long term (current) drug therapy: Secondary | ICD-10-CM | POA: Diagnosis not present

## 2021-02-14 DIAGNOSIS — C52 Malignant neoplasm of vagina: Secondary | ICD-10-CM | POA: Insufficient documentation

## 2021-02-14 DIAGNOSIS — I10 Essential (primary) hypertension: Secondary | ICD-10-CM | POA: Diagnosis not present

## 2021-02-14 DIAGNOSIS — Z85118 Personal history of other malignant neoplasm of bronchus and lung: Secondary | ICD-10-CM | POA: Insufficient documentation

## 2021-02-14 DIAGNOSIS — E039 Hypothyroidism, unspecified: Secondary | ICD-10-CM | POA: Insufficient documentation

## 2021-02-14 DIAGNOSIS — N901 Moderate vulvar dysplasia: Secondary | ICD-10-CM | POA: Insufficient documentation

## 2021-02-14 DIAGNOSIS — L900 Lichen sclerosus et atrophicus: Secondary | ICD-10-CM

## 2021-02-14 DIAGNOSIS — Z8542 Personal history of malignant neoplasm of other parts of uterus: Secondary | ICD-10-CM | POA: Insufficient documentation

## 2021-02-14 DIAGNOSIS — C519 Malignant neoplasm of vulva, unspecified: Secondary | ICD-10-CM

## 2021-02-14 DIAGNOSIS — E785 Hyperlipidemia, unspecified: Secondary | ICD-10-CM | POA: Diagnosis not present

## 2021-02-14 DIAGNOSIS — R87622 Low grade squamous intraepithelial lesion on cytologic smear of vagina (LGSIL): Secondary | ICD-10-CM | POA: Diagnosis present

## 2021-02-14 DIAGNOSIS — Z923 Personal history of irradiation: Secondary | ICD-10-CM | POA: Diagnosis not present

## 2021-02-14 DIAGNOSIS — Z8541 Personal history of malignant neoplasm of cervix uteri: Secondary | ICD-10-CM | POA: Insufficient documentation

## 2021-02-14 DIAGNOSIS — N891 Moderate vaginal dysplasia: Secondary | ICD-10-CM | POA: Diagnosis present

## 2021-02-14 DIAGNOSIS — J45909 Unspecified asthma, uncomplicated: Secondary | ICD-10-CM | POA: Insufficient documentation

## 2021-02-14 HISTORY — PX: VULVA /PERINEUM BIOPSY: SHX319

## 2021-02-14 LAB — POCT I-STAT, CHEM 8
BUN: 15 mg/dL (ref 8–23)
Calcium, Ion: 0.93 mmol/L — ABNORMAL LOW (ref 1.15–1.40)
Chloride: 101 mmol/L (ref 98–111)
Creatinine, Ser: 0.5 mg/dL (ref 0.44–1.00)
Glucose, Bld: 105 mg/dL — ABNORMAL HIGH (ref 70–99)
HCT: 39 % (ref 36.0–46.0)
Hemoglobin: 13.3 g/dL (ref 12.0–15.0)
Potassium: 3.8 mmol/L (ref 3.5–5.1)
Sodium: 134 mmol/L — ABNORMAL LOW (ref 135–145)
TCO2: 23 mmol/L (ref 22–32)

## 2021-02-14 SURGERY — EXAM UNDER ANESTHESIA
Anesthesia: Monitor Anesthesia Care | Site: Vulva

## 2021-02-14 MED ORDER — SODIUM CHLORIDE 0.9 % IV SOLN: INTRAVENOUS | Status: DC

## 2021-02-14 MED ORDER — ONDANSETRON HCL 4 MG/2ML IJ SOLN: 4.0000 mg | Freq: Once | INTRAMUSCULAR | Status: DC | PRN

## 2021-02-14 MED ORDER — ACETAMINOPHEN 500 MG PO TABS
1000.0000 mg | ORAL_TABLET | Freq: Once | ORAL | Status: AC
  Administered 2021-02-14: 1000 mg via ORAL

## 2021-02-14 MED ORDER — FENTANYL CITRATE (PF) 100 MCG/2ML IJ SOLN: 25.0000 ug | INTRAMUSCULAR | Status: DC | PRN

## 2021-02-14 MED ORDER — BUPIVACAINE HCL 0.25 % IJ SOLN
INTRAMUSCULAR | Status: DC | PRN
  Administered 2021-02-14: 15 mL

## 2021-02-14 MED ORDER — PROPOFOL 500 MG/50ML IV EMUL
INTRAVENOUS | Status: DC | PRN
  Administered 2021-02-14: 75 ug/kg/min via INTRAVENOUS

## 2021-02-14 MED ORDER — 0.9 % SODIUM CHLORIDE (POUR BTL) OPTIME
TOPICAL | Status: DC | PRN
  Administered 2021-02-14: 500 mL

## 2021-02-14 MED ORDER — PROPOFOL 10 MG/ML IV BOLUS
INTRAVENOUS | Status: DC | PRN
  Administered 2021-02-14: 20 mg via INTRAVENOUS
  Administered 2021-02-14 (×2): 30 mg via INTRAVENOUS

## 2021-02-14 MED ORDER — ACETAMINOPHEN 500 MG PO TABS
ORAL_TABLET | ORAL | Status: AC
  Filled 2021-02-14: qty 2

## 2021-02-14 MED ORDER — ACETAMINOPHEN 500 MG PO TABS: 500.0000 mg | ORAL_TABLET | ORAL | Status: DC

## 2021-02-14 MED ORDER — ONDANSETRON HCL 4 MG PO TABS: 4.0000 mg | ORAL_TABLET | Freq: Four times a day (QID) | ORAL | Status: DC | PRN

## 2021-02-14 MED ORDER — AMISULPRIDE (ANTIEMETIC) 5 MG/2ML IV SOLN: 10.0000 mg | Freq: Once | INTRAVENOUS | Status: DC | PRN

## 2021-02-14 MED ORDER — ONDANSETRON HCL 4 MG/2ML IJ SOLN: 4.0000 mg | Freq: Four times a day (QID) | INTRAMUSCULAR | Status: DC | PRN

## 2021-02-14 MED ORDER — ACETAMINOPHEN 325 MG PO TABS: 650.0000 mg | ORAL_TABLET | ORAL | Status: DC | PRN

## 2021-02-14 MED ORDER — ONDANSETRON HCL 4 MG/2ML IJ SOLN
INTRAMUSCULAR | Status: DC | PRN
Start: 2021-02-14 — End: 2021-02-14
  Administered 2021-02-14: 4 mg via INTRAVENOUS

## 2021-02-14 MED ORDER — ACETIC ACID 5 % SOLN
Status: DC | PRN
  Administered 2021-02-14: 1 via TOPICAL

## 2021-02-14 SURGICAL SUPPLY — 31 items
BLADE CLIPPER SENSICLIP SURGIC (BLADE) IMPLANT
BLADE SURG 11 STRL SS (BLADE) IMPLANT
BLADE SURG 15 STRL LF DISP TIS (BLADE) ×1 IMPLANT
BLADE SURG 15 STRL SS (BLADE) ×2
CATH ROBINSON RED A/P 16FR (CATHETERS) ×2 IMPLANT
DRSG TELFA 3X8 NADH (GAUZE/BANDAGES/DRESSINGS) ×2 IMPLANT
GAUZE 4X4 16PLY ~~LOC~~+RFID DBL (SPONGE) ×5 IMPLANT
GLOVE SURG ENC MOIS LTX SZ6 (GLOVE) ×4 IMPLANT
GOWN STRL REUS W/TWL LRG LVL3 (GOWN DISPOSABLE) ×2 IMPLANT
KIT TURNOVER CYSTO (KITS) ×2 IMPLANT
NDL HYPO 25X1 1.5 SAFETY (NEEDLE) ×1 IMPLANT
NEEDLE HYPO 25X1 1.5 SAFETY (NEEDLE) ×2 IMPLANT
NS IRRIG 500ML POUR BTL (IV SOLUTION) ×2 IMPLANT
PACK PERINEAL COLD (PAD) ×2 IMPLANT
PACK VAGINAL WOMENS (CUSTOM PROCEDURE TRAY) ×2 IMPLANT
PAD DRESSING TELFA 3X8 NADH (GAUZE/BANDAGES/DRESSINGS) IMPLANT
PENCIL BUTTON HOLSTER BLD 10FT (ELECTRODE) ×2 IMPLANT
PUNCH BIOPSY DERMAL 3 (INSTRUMENTS) IMPLANT
PUNCH BIOPSY DERMAL 3MM (INSTRUMENTS)
PUNCH BIOPSY DERMAL 4MM (INSTRUMENTS) IMPLANT
SUT VIC AB 0 SH 27 (SUTURE) ×2 IMPLANT
SUT VIC AB 2-0 CT2 27 (SUTURE) IMPLANT
SUT VIC AB 2-0 SH 27 (SUTURE)
SUT VIC AB 2-0 SH 27XBRD (SUTURE) IMPLANT
SUT VIC AB 3-0 SH 27 (SUTURE) ×2
SUT VIC AB 3-0 SH 27X BRD (SUTURE) ×1 IMPLANT
SUT VIC AB 4-0 PS2 18 (SUTURE) ×2 IMPLANT
SWAB OB GYN 8IN STERILE 2PK (MISCELLANEOUS) IMPLANT
SYR BULB IRRIG 60ML STRL (SYRINGE) ×2 IMPLANT
TOWEL OR 17X26 10 PK STRL BLUE (TOWEL DISPOSABLE) ×4 IMPLANT
WATER STERILE IRR 500ML POUR (IV SOLUTION) ×2 IMPLANT

## 2021-02-14 NOTE — Interval H&P Note (Signed)
History and Physical Interval Note:  02/14/2021 11:55 AM  [name] Richards  has presented today for surgery, with the diagnosis of HISTORY OF VULVAR CANCER.  The various methods of treatment have been discussed with the patient and family. After consideration of risks, benefits and other options for treatment, the patient has consented to  Procedure(s): EXAM UNDER ANESTHESIA, VULVOSCOPY (N/A) VAGINAL AND VULVAR BIOPSY (N/A) as a surgical intervention.  The patient's history has been reviewed, patient examined, no change in status, stable for surgery.  I have reviewed the patient's chart and labs.  Questions were answered to the patient's satisfaction.     [name]

## 2021-02-14 NOTE — Discharge Instructions (Addendum)
No acetaminophen/Tylenol until after 5:15pm today if needed for pain.    AFTER SURGERY INSTRUCTIONS   Return to work:  1-2 days if applicable  Today [name] Richards took biopsies of the vulva and vagina. You may see some brown or grayish discharge from the biopsy areas due to the cauterization used during surgery to stop bleeding after the biopsies.   Use the peri bottle after toileting and pat things dry.  We recommend purchasing several bags of frozen green peas and dividing them into ziploc bags. You will want to keep these in the freezer and have them ready to use as ice packs to the vulvar incision. Once the ice pack is no longer cold, you can get another from the freezer. The frozen peas mold to your body better than a regular ice pack.    Activity: 1. Be up and out of the bed during the day.  Take a nap if needed.  You may walk up steps but be careful and use the hand rail.  Stair climbing will tire you more than you think, you may need to stop part way and rest.    2. No lifting or straining for 1-2 weeks over 10 pounds. No pushing, pulling, straining for 1-2 weeks.   3. No driving for minimum 24 hours after surgery.  Do not drive if you are taking narcotic pain medicine and make sure that your reaction time has returned.    4. You can shower as soon as the next day after surgery. Shower daily. No tub baths or submerging your body in water until cleared by your surgeon.    5. No sexual activity and nothing in the vagina for minimum 2 weeks.   6. You may experience vaginal/vulvar spotting and discharge after surgery.  The spotting is normal but if you experience heavy bleeding, call our office.   7. Take Tylenol or ibuprofen first for pain.  Monitor your Tylenol intake to a max of 4,000 mg in a 24 hour period.   Diet: 1. Low sodium Heart Healthy Diet is recommended but you are cleared to resume your normal (before surgery) diet after your procedure.   2. It is safe to use a laxative,  such as Miralax or Colace, if you have difficulty moving your bowels.    Wound Care: 1. Keep clean and dry.  Shower daily.   Reasons to call the Doctor: Fever - Oral temperature greater than 100.4 degrees Fahrenheit Foul-smelling vaginal discharge Difficulty urinating Nausea and vomiting Increased pain at the site of the incision that is unrelieved with pain medicine. Difficulty breathing with or without chest pain New calf pain especially if only on one side Sudden, continuing increased vaginal bleeding with or without clots.   Contacts: For questions or concerns you should contact:   Dr. Jeral Pinch at [phone]   Joylene John, NP at [phone]   After Hours: call [phone] and have the GYN Oncologist paged/contacted (after 5 pm or on the weekends).   Messages sent via mychart are for non-urgent matters and are not responded to after hours so for urgent needs, please call the after hours number.     Post Anesthesia Home Care Instructions  Activity: Get plenty of rest for the remainder of the day. A responsible individual must stay with you for 24 hours following the procedure.  For the next 24 hours, DO NOT: -Drive a car -Paediatric nurse -Drink alcoholic beverages -Take any medication unless instructed by your physician -Make any legal  decisions or sign important papers.  Meals: Start with liquid foods such as gelatin or soup. Progress to regular foods as tolerated. Avoid greasy, spicy, heavy foods. If nausea and/or vomiting occur, drink only clear liquids until the nausea and/or vomiting subsides. Call your physician if vomiting continues.  Special Instructions/Symptoms: Your throat may feel dry or sore from the anesthesia or the breathing tube placed in your throat during surgery. If this causes discomfort, gargle with warm salt water. The discomfort should disappear within 24 hours.

## 2021-02-14 NOTE — Anesthesia Preprocedure Evaluation (Addendum)
Anesthesia Evaluation  Patient identified by MRN, date of birth, ID band Patient awake    Reviewed: Allergy & Precautions, NPO status , Patient's Chart, lab work & pertinent test results  Airway Mallampati: II  TM Distance: >3 FB Neck ROM: Full    Dental no notable dental hx.    Pulmonary asthma ,  H/o lung cancer   Pulmonary exam normal breath sounds clear to auscultation       Cardiovascular Exercise Tolerance: Good hypertension, Pt. on medications and Pt. on home beta blockers Normal cardiovascular exam Rhythm:Regular Rate:Normal     Neuro/Psych negative neurological ROS  negative psych ROS   GI/Hepatic negative GI ROS, Neg liver ROS,   Endo/Other  Hypothyroidism hyperlipidemia  Renal/GU Renal disease (partial nephrectomy)     Musculoskeletal negative musculoskeletal ROS (+)   Abdominal   Peds negative pediatric ROS (+)  Hematology negative hematology ROS (+)   Anesthesia Other Findings   Reproductive/Obstetrics H/o cervical/endometrial cancer                             Anesthesia Physical Anesthesia Plan  ASA: 3  Anesthesia Plan: MAC   Post-op Pain Management:    Induction: Intravenous  PONV Risk Score and Plan: TIVA, Treatment may vary due to age or medical condition, Propofol infusion, Ondansetron and Dexamethasone  Airway Management Planned: Natural Airway and Simple Face Mask  Additional Equipment: None  Intra-op Plan:   Post-operative Plan:   Informed Consent: I have reviewed the patients History and Physical, chart, labs and discussed the procedure including the risks, benefits and alternatives for the proposed anesthesia with the patient or authorized representative who has indicated his/her understanding and acceptance.     Dental advisory given  Plan Discussed with: CRNA and Anesthesiologist  Anesthesia Plan Comments: (Propofol gtt. Natural airway.  GA/LMA as backup plan. Norton Blizzard, MD  )       Anesthesia Quick Evaluation

## 2021-02-14 NOTE — Op Note (Signed)
[name] DATE: 02/14/21  Preop Diagnosis: history of vulvar and vaginal cancer  Postoperative Diagnosis: same as above  Surgery: EUA, vaginal and vulvar biopsies  Surgeons:  Valarie Cones MD Assistant: none  Anesthesia: MAC  Estimated blood loss: 10 ml  IVF:  see I&O flowsheet   Urine output: n/a   Complications: None apparent  Pathology: Multiple vaginal biopsies, anterior vulvar biopsy, left peri-urethral biopsy  Operative findings: Significant anatomic changes from prior treatment including no posterior labia major and shortened vaginal length. Significant atrophy noted. Mild erythema and desquamative changes involving anterior vulva. Multiple spots of hyperkeratosis vs. acetowhite after application of acetic acid, all biopsied.   Procedure: The patient was identified in the preoperative holding area. Informed consent was signed on the chart. Patient was seen history was reviewed and exam was performed.   The patient was then taken to the operating room and placed in the supine position with SCD hose on. General anesthesia was then induced without difficulty. She was then placed in the dorsolithotomy position. The perineum was prepped with Betadine. The vagina was prepped with Betadine. The patient was then draped after the prep was dried.   Timeout was performed the patient, procedure, antibiotic, allergy, and length of procedure. 5% acetic acid solution was applied to the perineum and vagina with findings noted above. The subcuticular tissues were infiltrated with 1% lidocaine. Tischler forceps and a 62m punch biopsy were used to take biopsies from all locations. The bovie was used to obtain hemostasis at the biopsy sites. The vagina and vulvar tissues were irrigated and hemostasis noted.  All instrument, suture, laparotomy, Ray-Tec, and needle counts were correct x2. The patient tolerated the procedure well and was taken recovery room in stable condition.    KJeral PinchMD Gynecologic Oncology

## 2021-02-14 NOTE — Transfer of Care (Signed)
Immediate Anesthesia Transfer of Care Note  [name]  Procedure(s) Performed: Jasmine December UNDER ANESTHESIA, VULVOSCOPY (Vulva) VAGINAL AND VULVAR BIOPSY (Vulva)  Patient Location: PACU  Anesthesia Type:MAC  Level of Consciousness: awake, alert  and oriented  Airway & Oxygen Therapy: Patient Spontanous Breathing and Patient connected to face mask oxygen  Post-op Assessment: Report given to RN and Post -op Vital signs reviewed and stable  Post vital signs: Reviewed and stable  Last Vitals:  Vitals Value Taken Time  BP 121/69 02/14/21 1243  Temp    Pulse 54 02/14/21 1245  Resp 12 02/14/21 1245  SpO2 100 % 02/14/21 1245  Vitals shown include unvalidated device data.  Last Pain:  Vitals:   02/14/21 1126  TempSrc: Oral  PainSc: 0-No pain      Patients Stated Pain Goal: 2 (00/34/91 7915)  Complications: No notable events documented.

## 2021-02-14 NOTE — Progress Notes (Signed)
Patients blood pressure elevated in PACU. 161/67 and 174/76. Dr. Elgie Congo notified and no new orders were given. Dr. Elgie Congo advised RN to tell patient to resume normal blood pressure medication when she gets home.

## 2021-02-14 NOTE — Anesthesia Postprocedure Evaluation (Signed)
Anesthesia Post Note  [name]  Procedure(s) Performed: EXAM UNDER ANESTHESIA, VULVOSCOPY (Vulva) VAGINAL AND VULVAR BIOPSY (Vulva)     Patient location during evaluation: PACU Anesthesia Type: MAC Level of consciousness: awake and alert Pain management: pain level controlled Vital Signs Assessment: post-procedure vital signs reviewed and stable Respiratory status: spontaneous breathing and respiratory function stable Cardiovascular status: stable Postop Assessment: no apparent nausea or vomiting Anesthetic complications: no   No notable events documented.  Last Vitals:  Vitals:   02/14/21 1315 02/14/21 1330  BP: (!) 157/73 (!) 161/67  Pulse: (!) 50 (!) 50  Resp: 11 16  Temp:  (!) 36.3 C  SpO2: 99% 100%    Last Pain:  Vitals:   02/14/21 1330  TempSrc:   PainSc: 0-No pain                 Merlinda Frederick

## 2021-02-15 ENCOUNTER — Encounter (HOSPITAL_BASED_OUTPATIENT_CLINIC_OR_DEPARTMENT_OTHER): Payer: Self-pay | Admitting: Gynecologic Oncology

## 2021-02-15 ENCOUNTER — Telehealth: Payer: Self-pay

## 2021-02-15 NOTE — Telephone Encounter (Signed)
Spoke with [name] Richards this morning. She states she is eating, drinking and urinating well. Denies any burning with urination. Patient did not receive a peri-bottle from PACU yesterday. Instructed patient that a peri-bottle will be left for her at the cancer center registration desk. Patient will pick up today. She has not had a BM yet and is not passing gas. She is taking miralax, a stool softener and has increased her fiber intake. Encouraged her to drink plenty of water. She denies fever or chills. She rates her pain 0/10. She took ibuprofen yesterday afternoon but has needed to take anything since then.   Instructed to call office with any fever, chills, purulent drainage, uncontrolled pain or any other questions or concerns. Patient verbalizes understanding.   Pt aware of post op appointments as well as the office number [phone] and after hours number [phone] to call if she has any questions or concerns

## 2021-02-19 ENCOUNTER — Encounter: Payer: Self-pay | Admitting: Gynecologic Oncology

## 2021-02-20 ENCOUNTER — Telehealth: Payer: Self-pay | Admitting: Oncology

## 2021-02-20 ENCOUNTER — Encounter: Payer: Self-pay | Admitting: Gynecologic Oncology

## 2021-02-20 NOTE — Telephone Encounter (Signed)
[name] and reassured her about pending pathology.  Advised her that we do not have a preliminary diagnosis yet and will call her as soon as the report is back.  She verbalized understanding and agreement.

## 2021-02-21 ENCOUNTER — Telehealth: Payer: Self-pay | Admitting: Gynecologic Oncology

## 2021-02-21 ENCOUNTER — Encounter: Payer: Self-pay | Admitting: Gynecologic Oncology

## 2021-02-21 DIAGNOSIS — C519 Malignant neoplasm of vulva, unspecified: Secondary | ICD-10-CM

## 2021-02-21 NOTE — Telephone Encounter (Signed)
Called patient to discuss biopsies. 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 Patient understnadably upset by this news. She very much would like to avoid further surgery. We discussed option of trying to resect area where biopsy showed cancer, laser precancerous areas. Discussed other treatment options in the setting of recurrent vulvar/vaginal cancer, including radiation and systemic therapy. I will reach out to Dr. Sondra Come about possibility of additional RT.   Plan to get PET scan. Will discuss case at tumor board given her strong preference to avoid surgery.  Jeral Pinch MD Gynecologic Oncology

## 2021-02-22 ENCOUNTER — Telehealth: Payer: Self-pay | Admitting: *Deleted

## 2021-02-22 ENCOUNTER — Telehealth: Payer: Medicare Other | Admitting: Gynecologic Oncology

## 2021-02-22 NOTE — Telephone Encounter (Signed)
Per Dr Berline Lopes scheduled the patient for a PET scan and MD visit. Patient given the dates/times of the appts along with instructions

## 2021-02-26 ENCOUNTER — Telehealth: Payer: Self-pay | Admitting: Oncology

## 2021-02-26 NOTE — Telephone Encounter (Signed)
[name] Richards of appointment with Dr. Sondra Come on 03/07/2021 - nurse eval at 1:30 and reconsult at 2:00.  She verbalized understanding and agreement.

## 2021-02-26 NOTE — Telephone Encounter (Signed)
Called Nicolette back and rescheduled radiation appointment to 1:00 on 03/07/21.

## 2021-02-26 NOTE — Telephone Encounter (Signed)
Left a message to schedule appointment with Dr. Sondra Come.  Requested a return call.

## 2021-02-28 ENCOUNTER — Other Ambulatory Visit: Payer: Self-pay

## 2021-02-28 ENCOUNTER — Encounter: Payer: Self-pay | Admitting: Gynecologic Oncology

## 2021-02-28 ENCOUNTER — Ambulatory Visit (HOSPITAL_COMMUNITY)
Admission: RE | Admit: 2021-02-28 | Discharge: 2021-02-28 | Disposition: A | Discharge status: Home / Self Care | Payer: Medicare Other | Source: Ambulatory Visit | Attending: Gynecologic Oncology | Admitting: Gynecologic Oncology

## 2021-02-28 DIAGNOSIS — C519 Malignant neoplasm of vulva, unspecified: Secondary | ICD-10-CM | POA: Insufficient documentation

## 2021-02-28 DIAGNOSIS — R911 Solitary pulmonary nodule: Secondary | ICD-10-CM | POA: Diagnosis not present

## 2021-02-28 DIAGNOSIS — I2510 Atherosclerotic heart disease of native coronary artery without angina pectoris: Secondary | ICD-10-CM | POA: Diagnosis not present

## 2021-02-28 DIAGNOSIS — I700 Atherosclerosis of aorta: Secondary | ICD-10-CM | POA: Diagnosis not present

## 2021-02-28 LAB — GLUCOSE, CAPILLARY: Glucose-Capillary: 108 mg/dL — ABNORMAL HIGH (ref 70–99)

## 2021-02-28 MED ORDER — FLUDEOXYGLUCOSE F - 18 (FDG) INJECTION
7.5000 | Freq: Once | INTRAVENOUS | Status: AC | PRN
  Administered 2021-02-28: 7.5 via INTRAVENOUS

## 2021-03-01 ENCOUNTER — Telehealth: Payer: Self-pay | Admitting: Gynecologic Oncology

## 2021-03-01 NOTE — Telephone Encounter (Signed)
Called the patient and reviewed PET scan, discussed findings. All questions answered.  Jeral Pinch MD Gynecologic Oncology

## 2021-03-01 NOTE — Progress Notes (Signed)
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  There are some changes along the posterior aspect of the vagina that I suspect are related to radiation changes.  We discussed performing vaginoscopy and vulvoscopy today as well as biopsies.  Overall, my impression is that this is not recurrent malignancy.  Biopsies revealed:  A. VAGINA, 5:00, BIOPSY:  -  Invasive squamous cell carcinoma  -  See comment   B. VAGINA, 3:00, BIOPSY:  -  Focal atypia with underlying inflammation  -  See comment   C. PERI-CLITORAL, 1:00, BIOPSY:  -  Low grade squamous intraepithelial lesion  -  See comment   D. VAGINAL APEX, BIOPSY:  -  Low grade squamous intraepithelial lesion  -  See comment   E. 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 Marti Sleigh.  1997 - total abdominal hysterectomy and bilateral salpingo-oophorectomy, pelvic and para-aortic lymphadenectomy.  Past/Anticipated interventions by medical oncology, if any: none  Weight changes, if any: no  Bowel/Bladder complaints, if any: Yes.  , diarrhea  Nausea/Vomiting, if any: yes, nausea and vomiting  Pain issues, if any:  no  SAFETY ISSUES: Prior radiation? yes, vaginal brachytherapy 09/29/2019 through 10/12/2019, postoperative whole pelvis radiation therapy  1997 Pacemaker/ICD? no Possible current pregnancy? no, hysterectomy Is the patient on methotrexate? no  Current Complaints / other details:  none  Vitals:   03/07/21 1301  BP: 139/61  Pulse: 61  Resp: 18  Temp: (!) 96.1 F (35.6 C)  TempSrc: Temporal  SpO2: 100%  Weight: 186 lb 2 oz (84.4 kg)  Height: 5\' 5"  (1.651 m)

## 2021-03-04 ENCOUNTER — Other Ambulatory Visit: Payer: Self-pay

## 2021-03-04 ENCOUNTER — Inpatient Hospital Stay: Payer: Medicare Other | Attending: Gynecologic Oncology | Admitting: Gynecologic Oncology

## 2021-03-04 ENCOUNTER — Other Ambulatory Visit: Payer: Self-pay | Admitting: Oncology

## 2021-03-04 ENCOUNTER — Encounter: Payer: Self-pay | Admitting: Gynecologic Oncology

## 2021-03-04 VITALS — BP 132/56 | HR 65 | Temp 97.3°F | Resp 18 | Ht 65.0 in | Wt 156.4 lb

## 2021-03-04 DIAGNOSIS — D072 Carcinoma in situ of vagina: Secondary | ICD-10-CM | POA: Insufficient documentation

## 2021-03-04 DIAGNOSIS — C519 Malignant neoplasm of vulva, unspecified: Secondary | ICD-10-CM

## 2021-03-04 DIAGNOSIS — Z8542 Personal history of malignant neoplasm of other parts of uterus: Secondary | ICD-10-CM | POA: Insufficient documentation

## 2021-03-04 DIAGNOSIS — Z90710 Acquired absence of both cervix and uterus: Secondary | ICD-10-CM | POA: Diagnosis not present

## 2021-03-04 DIAGNOSIS — Z90722 Acquired absence of ovaries, bilateral: Secondary | ICD-10-CM | POA: Diagnosis not present

## 2021-03-04 DIAGNOSIS — Z7189 Other specified counseling: Secondary | ICD-10-CM | POA: Diagnosis not present

## 2021-03-04 DIAGNOSIS — Z8544 Personal history of malignant neoplasm of other female genital organs: Secondary | ICD-10-CM | POA: Diagnosis not present

## 2021-03-04 DIAGNOSIS — Z9079 Acquired absence of other genital organ(s): Secondary | ICD-10-CM | POA: Diagnosis not present

## 2021-03-04 DIAGNOSIS — Z923 Personal history of irradiation: Secondary | ICD-10-CM | POA: Insufficient documentation

## 2021-03-04 DIAGNOSIS — C52 Malignant neoplasm of vagina: Secondary | ICD-10-CM

## 2021-03-04 DIAGNOSIS — N904 Leukoplakia of vulva: Secondary | ICD-10-CM | POA: Insufficient documentation

## 2021-03-04 NOTE — Progress Notes (Signed)
Gynecologic Oncology Multi-Disciplinary Disposition Conference Note  Date of the Conference: 03/04/2021  Patient Name: [name] Richards  Primary GYN Oncologist: Dr. Berline Lopes Radiation Oncologist: Dr. 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 Disposition is to consideration for external beam radiation therapy.   This Multidisciplinary conference took place involving physicians from [facility], Medical Oncology, Radiation Oncology, Pathology, Radiology along with the Gynecologic Oncology Nurse Practitioner and Gynecologic Oncology Nurse Navigator.  Comprehensive assessment of the patient's malignancy, staging, need for surgery, chemotherapy, radiation therapy, and need for further testing were reviewed. Supportive measures, both inpatient and following discharge were also discussed. The recommended plan of care is documented. Greater than 35 minutes were spent correlating and coordinating this patient's care.

## 2021-03-04 NOTE — Progress Notes (Signed)
Gynecologic Oncology Return Clinic Visit  03/04/2021  Reason for Visit: Treatment planning  Treatment History: Oncology History Overview Note  Patient followed due to HX pulmonary nodules noted 2011  Primary cancer of left upper lobe of lung ([place])   Staging form: Lung, AJCC 7th Edition   - Clinical stage from 09/27/2015: Stage IA (T1b, N0, M0) - Signed by Curt Bears, MD on 09/27/2015    Primary cancer of left upper lobe of lung ([place])  07/12/2015 Imaging   CT Chest IMPRESSION:  The solid component of the large part solid nodule in the left upper lobe measures 9 x 3 mm (mean diameter 6 mm.) persistent part solid nodules with solid components greater than or equal to 6 mm should be considered highly suspicious for pulmonary adenocarcinoma   08/01/2015 Initial Diagnosis   Primary cancer of left upper lobe of lung ([place])   08/01/2015 Surgery   PROCEDURE:  Left video-assisted thoracoscopy,           Wedge resection,           Thoracoscopic lingular sparing left upper lobectomy           Lymph node dissection           On-Q local anesthetic catheter placement   08/01/2015 Pathology Results   Lung, resection (segmental or lobe), Left Upper Lobe - ADENOCARCINOMA, WELL DIFFERENTIATED, SPANNING 2.5 CM. - THE SURGICAL RESECTION MARGINS ARE NEGATIVE FOR CARCINOMA. 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 She underwent a total abdominal hysterectomy and bilateral salpingo-oophorectomy, pelvic and para-aortic lymphadenectomy. She received postoperative whole pelvis radiation therapy.  Her other primary gynecologic problem has been lichen sclerosus managed with when necessary clobetasol.   Beginning in 2013 the patient had intermittent episodes of partial small bowel obstruction most likely secondary to radiation injury to the terminal  ileum.   In December 2016 the patient was found to have a new primary squamous cell carcinoma the vulva undergoing a modified radical vulvectomy on 02/06/2015. She underwent a modified radical vulvectomy at [facility] on 02/06/2015. She was found to have a 3 cm vulvar lesion of the posterior vulva extending into the posterior vagina. A rhomboid flap was required to close the incision.  Final pathology showed some close surgical margins. However, the specimen margins were toward the time of surgery and it was Dr Mora Bellman impression that they were widely around the primary cancer. She discussed options with the patient and they agreed to monitor her closely but not recommend any radiation therapy to the vulva.   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  Depth of invasion was 4 mm all margins were negative.  Follow-up PET scan March 23, 2017 showed no evidence of metastatic disease.   Examination of the vulva during routine surveillance on 07/20/19 showed a friable, verrucous lesion measuring approximately 2 cm was seen on the anterior wall of the suburethral vagina extending to the introitus and wrapping around to the right mid vagina. This was concerning for recurrence and was biopsied at that appointment. 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    On 08/11/19 she underwent total vaginectomy. Intraoperative findings were significant for a forshortened 3cm vagina, friable 4cm plaque on the anterior vagina extending to the right fornix and  left posterior vaginal wall. This necessitated a total vaginectomy to resect the lesion grossly. Her tissues were extremely friable due to age, prior radiation, menopause and lichen sclerosis. There was unavoidable fragmentation of the tissues during the resection. All gross visible disease was removed. Surgery was uncomplicated, and she reported no postop pain.  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 The suspicious foci are present at the edges of tissue fragments.  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    Post-op PET scan in August, 2021 showed no metastatic disease.   The patient received adjuvant vaginal brachytherapy between 09/29/2019 through 10/12/2019.  She tolerated treatment very well with no toxicities.  I saw the patient on 12/28.  At the time of her visit, I was concerned for dysplastic changes versus radiation treatment within the vagina and on her vulva.  On 02/14/2021, the patient underwent exam under anesthesia, vaginal and vulvar biopsies.  Findings were notable for anatomic changes related to prior surgical treatment and radiation.  Significant atrophy was noted.  Multiple spots of hyperkeratosis versus acetowhite after application of acetic acid were found, all biopsied.  Final pathology revealed invasive squamous cell carcinoma at 5:00 of the vaginal biopsy.  Multiple other biopsies including a 1:00 periclitoral biopsy and apex of the vagina showed low-grade squamous intraepithelial lesion.  Biopsy from 7:00 within the vagina showed high-grade squamous intraepithelial lesion.  PET scan was performed on 02/28/2021.  There was noted to be interval decrease in degree of FDG uptake within the vaginal introitus compared to PET scan in 2021 after her last surgery.  No concerning findings for metastatic disease.  Comment was noted that there is mild uptake within a left inguinal lymph node but the morphology is benign.  Imaging was reviewed at our tumor conference on 1/23 with discussion being findings did not represent metastatic disease.  Interval History: Patient reports overall doing well, healing from surgery.   Comes in with her son today who is visiting from [facility].  He is in healthcare.  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 S/p TAH and radiation x6 wks    Complication of anesthesia    took 2 days to wake up after 1 surgery at 20, pt can't use a adult cath,needs peds. cath due to past injury   Constipation    very worried about this   Endometrial cancer [facility]) 1997   coincidental endometrial cancer also   History of radiation therapy 10/12/2019   [facility] brachytherapy  09/29/2019-10/12/2019  Dr Gery Pray   Hyperlipemia    Hypertension    Hypothyroidism    Primary cancer of left upper lobe of lung ([place]) 08/01/2015   Small bowel obstruction ([place])    12/2008, repeat SBO in 02/2011 also with terminal ilietis    Thyroid disease    Vertebral body hemangioma    T12    Past Surgical History:  Procedure Laterality Date   ABDOMINAL HYSTERECTOMY  Feb 1997   TAH/BSO, pelvic and periaortic lymphadenectomy    BACK SURGERY     EYE SURGERY     Lasik    LYMPH NODE DISSECTION Left 08/01/2015  Procedure: LYMPH NODE DISSECTION;  Surgeon: Melrose Nakayama, MD;  Location: [place];  Service: Thoracic;  Laterality: Left;   PARTIAL NEPHRECTOMY     right   SEGMENTECOMY Left 08/01/2015   Procedure: Left Upper Lobe SEGMENTECTOMY;  Surgeon: Melrose Nakayama, MD;  Location: [place];  Service: Thoracic;  Laterality: Left;   SKIN BIOPSY     TUBAL LIGATION     TUMOR REMOVAL     meningioma  T 12   VAGINECTOMY, PARTIAL N/A 08/11/2019   Procedure: TOTAL VAGINECTOMY;  Surgeon: Everitt Amber, MD;  Location: [facility];  Service: Gynecology;  Laterality: N/A;   VIDEO ASSISTED THORACOSCOPY (VATS)/WEDGE RESECTION Left 08/01/2015   Procedure: VIDEO ASSISTED THORACOSCOPY (VATS)/WEDGE RESECTION;  Surgeon: Melrose Nakayama, MD;  Location: [place];   Service: Thoracic;  Laterality: Left;   VULVA Milagros Loll BIOPSY N/A 02/14/2021   Procedure: VAGINAL AND VULVAR BIOPSY;  Surgeon: [name], MD;  Location[facility];  Service: Gynecology;  Laterality: N/A;   VULVA SURGERY     x 2, 1 at [facility] (2016), and [facility]     Family History  Problem Relation Age of Onset   Hypertension Other    Diabetes Other    Stroke Other    CAD Other     Social History   Socioeconomic History   Marital status: Divorced    Spouse name: Not on file   Number of children: Not on file   Years of education: Not on file   Highest education level: Not on file  Occupational History   Not on file  Tobacco Use   Smoking status: Never   Smokeless tobacco: Never  Vaping Use   Vaping Use: Never used  Substance and Sexual Activity   Alcohol use: No   Drug use: No   Sexual activity: Never  Other Topics Concern   Not on file  Social History Narrative   Lives at home alone, works out at [employer] 6 days a week, still very active. Has an ex-husband who she is in contact with. Son died of an MI and another son in [place].    Social Determinants of Health   Financial Resource Strain: Not on file  Food Insecurity: Not on file  Transportation Needs: Not on file  Physical Activity: Not on file  Stress: Not on file  Social Connections: Not on file    Current Medications:  Current Outpatient Medications:    acyclovir (ZOVIRAX) 800 MG tablet, 1 tablet, Disp: , Rfl:    amLODipine (NORVASC) 5 MG tablet, SMARTSIG:2 Tablet(s) By Mouth Every Evening, Disp: , Rfl:    benazepril-hydrochlorthiazide (LOTENSIN HCT) 20-25 MG tablet, Take 1 tablet by mouth daily. , Disp: , Rfl:    BIOTIN PO, Take 1 tablet by mouth 4 (four) times a week., Disp: , Rfl:    Cholecalciferol (VITAMIN D3 PO), Take 1 tablet by mouth in the morning and at bedtime., Disp: , Rfl:    clobetasol (TEMOVATE) 0.05 % GEL, , Disp: , Rfl:    docusate sodium (COLACE) 100  MG capsule, Take 100 mg by mouth 2 (two) times daily., Disp: , Rfl:    Fexofenadine HCl (ALLEGRA PO), Take by mouth as needed., Disp: , Rfl:    levothyroxine (SYNTHROID, LEVOTHROID) 75 MCG tablet, Take 75 mcg by mouth daily., Disp: , Rfl:    loratadine (CLARITIN) 10 MG tablet, Take 10 mg by mouth daily as needed for allergies., Disp: , Rfl:  MAGNESIUM PO, Take 1 tablet by mouth daily., Disp: , Rfl:    Multiple Vitamin (MULTIVITAMIN WITH MINERALS) TABS tablet, Take 1 tablet by mouth daily., Disp: , Rfl:    Omega-3 Fatty Acids (FISH OIL PO), Take 1 capsule by mouth daily., Disp: , Rfl:    polyethylene glycol (MIRALAX / GLYCOLAX) packet, Take 11.3333 g by mouth at bedtime. 2/3 capful, Disp: , Rfl:    simvastatin (ZOCOR) 20 MG tablet, Take 20 mg by mouth at bedtime. , Disp: , Rfl:   Review of Systems: Denies appetite changes, fevers, chills, fatigue, unexplained weight changes. Denies hearing loss, neck lumps or masses, mouth sores, ringing in ears or voice changes. Denies cough or wheezing.  Denies shortness of breath. Denies chest pain or palpitations. Denies leg swelling. Denies abdominal distention, pain, blood in stools, constipation, diarrhea, nausea, vomiting, or early satiety. Denies pain with intercourse, dysuria, frequency, hematuria or incontinence. Denies hot flashes, pelvic pain, vaginal bleeding or vaginal discharge.   Denies joint pain, back pain or muscle pain/cramps. Denies itching, rash, or wounds. Denies dizziness, headaches, numbness or seizures. Denies swollen lymph nodes or glands, denies easy bruising or bleeding. Denies anxiety, depression, confusion, or decreased concentration.  Physical Exam: BP (!) 132/56 (BP Location: Left Arm, Patient Position: Sitting)    Pulse 65    Temp (!) 97.3 F (36.3 C) (Tympanic)    Resp 18    Ht 5\' 5"  (1.651 m)    Wt 156 lb 6.4 oz (70.9 kg)    SpO2 100%    BMI 26.03 kg/m  General: Alert, oriented, no acute distress. HEENT:  Normocephalic, atraumatic, sclera anicteric. Chest: Unlabored breathing on room air.  Laboratory & Radiologic Studies: None new  Assessment & Plan: [name] Richards is a 76 y.o. 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  I had previously discussed PET findings with the patient.  I reviewed these again with the patient and her son, who is with her today.  Also discussed our review at tumor board this morning where findings were felt not to represent metastatic disease.  In terms of treatment options, I have spoken with Dr. Sondra Come.  The patient received pelvic radiation over 20 years ago and recently had brachytherapy.  He believes that she is a candidate for further therapy and she is scheduled to see him later this week to discuss treatment options as well as toxicity.  We discussed surgical options again today.  In the setting of biopsy revealing recurrent cancer, I discussed the possibility of excision.  The areas of high-grade dysplasia could be treated concurrently with laser ablation.  The patient voices no interest in exenterative procedure.  She also strongly prefers to avoid surgery at this time if other treatment options exist.  She will call me after she meets with Dr. Sondra Come. If she moves forward with radiation treatment, I will see her for follow-up once she has finished treatment.  Patient has a longstanding history of lichen sclerosus.  In the setting of her cancer, and recent biopsies which are p16 negative, I suspect that her disease arises within the setting of her chronic lichen sclerosus.  We discussed preventative measures moving  forward with using topical steroid  to help control/treat chronic skin changes.  28 minutes of total time was spent for this patient encounter, including preparation, face-to-face counseling with the patient and coordination of care, and documentation of the encounter.  Jeral Pinch, MD  Division of Gynecologic Oncology  Department of Obstetrics and Gynecology  [facility]

## 2021-03-04 NOTE — H&P (View-Only) (Signed)
Gynecologic Oncology Return Clinic Visit  03/04/2021  Reason for Visit: Treatment planning  Treatment History: Oncology History Overview Note  Patient followed due to HX pulmonary nodules noted 2011  Primary cancer of left upper lobe of lung ([place])   Staging form: Lung, AJCC 7th Edition   - Clinical stage from 09/27/2015: Stage IA (T1b, N0, M0) - Signed by Curt Bears, MD on 09/27/2015    Primary cancer of left upper lobe of lung ([place])  07/12/2015 Imaging   CT Chest IMPRESSION:  The solid component of the large part solid nodule in the left upper lobe measures 9 x 3 mm (mean diameter 6 mm.) persistent part solid nodules with solid components greater than or equal to 6 mm should be considered highly suspicious for pulmonary adenocarcinoma   08/01/2015 Initial Diagnosis   Primary cancer of left upper lobe of lung ([place])   08/01/2015 Surgery   PROCEDURE:  Left video-assisted thoracoscopy,           Wedge resection,           Thoracoscopic lingular sparing left upper lobectomy           Lymph node dissection           On-Q local anesthetic catheter placement   08/01/2015 Pathology Results   Lung, resection (segmental or lobe), Left Upper Lobe - ADENOCARCINOMA, WELL DIFFERENTIATED, SPANNING 2.5 CM. - THE SURGICAL RESECTION MARGINS ARE NEGATIVE FOR CARCINOMA. 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 She underwent a total abdominal hysterectomy and bilateral salpingo-oophorectomy, pelvic and para-aortic lymphadenectomy. She received postoperative whole pelvis radiation therapy.  Her other primary gynecologic problem has been lichen sclerosus managed with when necessary clobetasol.   Beginning in 2013 the patient had intermittent episodes of partial small bowel obstruction most likely secondary to radiation injury to the terminal  ileum.   In December 2016 the patient was found to have a new primary squamous cell carcinoma the vulva undergoing a modified radical vulvectomy on 02/06/2015. She underwent a modified radical vulvectomy at [facility] on 02/06/2015. She was found to have a 3 cm vulvar lesion of the posterior vulva extending into the posterior vagina. A rhomboid flap was required to close the incision.  Final pathology showed some close surgical margins. However, the specimen margins were toward the time of surgery and it was Dr Mora Bellman impression that they were widely around the primary cancer. She discussed options with the patient and they agreed to monitor her closely but not recommend any radiation therapy to the vulva.   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  Depth of invasion was 4 mm all margins were negative.  Follow-up PET scan March 23, 2017 showed no evidence of metastatic disease.   Examination of the vulva during routine surveillance on 07/20/19 showed a friable, verrucous lesion measuring approximately 2 cm was seen on the anterior wall of the suburethral vagina extending to the introitus and wrapping around to the right mid vagina. This was concerning for recurrence and was biopsied at that appointment. 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    On 08/11/19 she underwent total vaginectomy. Intraoperative findings were significant for a forshortened 3cm vagina, friable 4cm plaque on the anterior vagina extending to the right fornix and  left posterior vaginal wall. This necessitated a total vaginectomy to resect the lesion grossly. Her tissues were extremely friable due to age, prior radiation, menopause and lichen sclerosis. There was unavoidable fragmentation of the tissues during the resection. All gross visible disease was removed. Surgery was uncomplicated, and she reported no postop pain.  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 The suspicious foci are present at the edges of tissue fragments.  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    Post-op PET scan in August, 2021 showed no metastatic disease.   The patient received adjuvant vaginal brachytherapy between 09/29/2019 through 10/12/2019.  She tolerated treatment very well with no toxicities.  I saw the patient on 12/28.  At the time of her visit, I was concerned for dysplastic changes versus radiation treatment within the vagina and on her vulva.  On 02/14/2021, the patient underwent exam under anesthesia, vaginal and vulvar biopsies.  Findings were notable for anatomic changes related to prior surgical treatment and radiation.  Significant atrophy was noted.  Multiple spots of hyperkeratosis versus acetowhite after application of acetic acid were found, all biopsied.  Final pathology revealed invasive squamous cell carcinoma at 5:00 of the vaginal biopsy.  Multiple other biopsies including a 1:00 periclitoral biopsy and apex of the vagina showed low-grade squamous intraepithelial lesion.  Biopsy from 7:00 within the vagina showed high-grade squamous intraepithelial lesion.  PET scan was performed on 02/28/2021.  There was noted to be interval decrease in degree of FDG uptake within the vaginal introitus compared to PET scan in 2021 after her last surgery.  No concerning findings for metastatic disease.  Comment was noted that there is mild uptake within a left inguinal lymph node but the morphology is benign.  Imaging was reviewed at our tumor conference on 1/23 with discussion being findings did not represent metastatic disease.  Interval History: Patient reports overall doing well, healing from surgery.   Comes in with her son today who is visiting from [facility].  He is in healthcare.  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 S/p TAH and radiation x6 wks    Complication of anesthesia    took 2 days to wake up after 1 surgery at 20, pt can't use a adult cath,needs peds. cath due to past injury   Constipation    very worried about this   Endometrial cancer [facility]) 1997   coincidental endometrial cancer also   History of radiation therapy 10/12/2019   [facility] HDR brachytherapy  09/29/2019-10/12/2019  Dr Gery Pray   Hyperlipemia    Hypertension    Hypothyroidism    Primary cancer of left upper lobe of lung ([place]) 08/01/2015   Small bowel obstruction ([place])    12/2008, repeat SBO in 02/2011 also with terminal ilietis    Thyroid disease    Vertebral body hemangioma    T12    Past Surgical History:  Procedure Laterality Date   ABDOMINAL HYSTERECTOMY  Feb 1997   TAH/BSO, pelvic and periaortic lymphadenectomy    BACK SURGERY     EYE SURGERY     Lasik    LYMPH NODE DISSECTION Left 08/01/2015  Procedure: LYMPH NODE DISSECTION;  Surgeon: Melrose Nakayama, MD;  Location: [facility];  Service: Thoracic;  Laterality: Left;   PARTIAL NEPHRECTOMY     right   SEGMENTECOMY Left 08/01/2015   Procedure: Left Upper Lobe SEGMENTECTOMY;  Surgeon: Melrose Nakayama, MD;  Location: [place];  Service: Thoracic;  Laterality: Left;   SKIN BIOPSY     TUBAL LIGATION     TUMOR REMOVAL     meningioma  T 12   VAGINECTOMY, PARTIAL N/A 08/11/2019   Procedure: TOTAL VAGINECTOMY;  Surgeon: Everitt Amber, MD;  Location: [facility];  Service: Gynecology;  Laterality: N/A;   VIDEO ASSISTED THORACOSCOPY (VATS)/WEDGE RESECTION Left 08/01/2015   Procedure: VIDEO ASSISTED THORACOSCOPY (VATS)/WEDGE RESECTION;  Surgeon: Melrose Nakayama, MD;  Location: [facility];   Service: Thoracic;  Laterality: Left;   VULVA Milagros Loll BIOPSY N/A 02/14/2021   Procedure: VAGINAL AND VULVAR BIOPSY;  Surgeon: [name], MD;  Location: [facility];  Service: Gynecology;  Laterality: N/A;   VULVA SURGERY     x 2, 1 at [facility] (2016), and [facility]     Family History  Problem Relation Age of Onset   Hypertension Other    Diabetes Other    Stroke Other    CAD Other     Social History   Socioeconomic History   Marital status: Divorced    Spouse name: Not on file   Number of children: Not on file   Years of education: Not on file   Highest education level: Not on file  Occupational History   Not on file  Tobacco Use   Smoking status: Never   Smokeless tobacco: Never  Vaping Use   Vaping Use: Never used  Substance and Sexual Activity   Alcohol use: No   Drug use: No   Sexual activity: Never  Other Topics Concern   Not on file  Social History Narrative   Lives at home alone, works out at [employer] 6 days a week, still very active. Has an ex-husband who she is in contact with. Son died of an MI and another son in [place].    Social Determinants of Health   Financial Resource Strain: Not on file  Food Insecurity: Not on file  Transportation Needs: Not on file  Physical Activity: Not on file  Stress: Not on file  Social Connections: Not on file    Current Medications:  Current Outpatient Medications:    acyclovir (ZOVIRAX) 800 MG tablet, 1 tablet, Disp: , Rfl:    amLODipine (NORVASC) 5 MG tablet, SMARTSIG:2 Tablet(s) By Mouth Every Evening, Disp: , Rfl:    benazepril-hydrochlorthiazide (LOTENSIN HCT) 20-25 MG tablet, Take 1 tablet by mouth daily. , Disp: , Rfl:    BIOTIN PO, Take 1 tablet by mouth 4 (four) times a week., Disp: , Rfl:    Cholecalciferol (VITAMIN D3 PO), Take 1 tablet by mouth in the morning and at bedtime., Disp: , Rfl:    clobetasol (TEMOVATE) 0.05 % GEL, , Disp: , Rfl:    docusate sodium (COLACE) 100  MG capsule, Take 100 mg by mouth 2 (two) times daily., Disp: , Rfl:    Fexofenadine HCl (ALLEGRA PO), Take by mouth as needed., Disp: , Rfl:    levothyroxine (SYNTHROID, LEVOTHROID) 75 MCG tablet, Take 75 mcg by mouth daily., Disp: , Rfl:    loratadine (CLARITIN) 10 MG tablet, Take 10 mg by mouth daily as needed for allergies., Disp: , Rfl:  MAGNESIUM PO, Take 1 tablet by mouth daily., Disp: , Rfl:    Multiple Vitamin (MULTIVITAMIN WITH MINERALS) TABS tablet, Take 1 tablet by mouth daily., Disp: , Rfl:    Omega-3 Fatty Acids (FISH OIL PO), Take 1 capsule by mouth daily., Disp: , Rfl:    polyethylene glycol (MIRALAX / GLYCOLAX) packet, Take 11.3333 g by mouth at bedtime. 2/3 capful, Disp: , Rfl:    simvastatin (ZOCOR) 20 MG tablet, Take 20 mg by mouth at bedtime. , Disp: , Rfl:   Review of Systems: Denies appetite changes, fevers, chills, fatigue, unexplained weight changes. Denies hearing loss, neck lumps or masses, mouth sores, ringing in ears or voice changes. Denies cough or wheezing.  Denies shortness of breath. Denies chest pain or palpitations. Denies leg swelling. Denies abdominal distention, pain, blood in stools, constipation, diarrhea, nausea, vomiting, or early satiety. Denies pain with intercourse, dysuria, frequency, hematuria or incontinence. Denies hot flashes, pelvic pain, vaginal bleeding or vaginal discharge.   Denies joint pain, back pain or muscle pain/cramps. Denies itching, rash, or wounds. Denies dizziness, headaches, numbness or seizures. Denies swollen lymph nodes or glands, denies easy bruising or bleeding. Denies anxiety, depression, confusion, or decreased concentration.  Physical Exam: BP (!) 132/56 (BP Location: Left Arm, Patient Position: Sitting)    Pulse 65    Temp (!) 97.3 F (36.3 C) (Tympanic)    Resp 18    Ht 5\' 5"  (1.651 m)    Wt 156 lb 6.4 oz (70.9 kg)    SpO2 100%    BMI 26.03 kg/m  General: Alert, oriented, no acute distress. HEENT:  Normocephalic, atraumatic, sclera anicteric. Chest: Unlabored breathing on room air.  Laboratory & Radiologic Studies: None new  Assessment & Plan: [name] Richards is a 76 y.o. 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  I had previously discussed PET findings with the patient.  I reviewed these again with the patient and her son, who is with her today.  Also discussed our review at tumor board this morning where findings were felt not to represent metastatic disease.  In terms of treatment options, I have spoken with Dr. Sondra Come.  The patient received pelvic radiation over 20 years ago and recently had brachytherapy.  He believes that she is a candidate for further therapy and she is scheduled to see him later this week to discuss treatment options as well as toxicity.  We discussed surgical options again today.  In the setting of biopsy revealing recurrent cancer, I discussed the possibility of excision.  The areas of high-grade dysplasia could be treated concurrently with laser ablation.  The patient voices no interest in exenterative procedure.  She also strongly prefers to avoid surgery at this time if other treatment options exist.  She will call me after she meets with Dr. Sondra Come. If she moves forward with radiation treatment, I will see her for follow-up once she has finished treatment.  Patient has a longstanding history of lichen sclerosus.  In the setting of her cancer, and recent biopsies which are p16 negative, I suspect that her disease arises within the setting of her chronic lichen sclerosus.  We discussed preventative measures moving  forward with using topical steroid  to help control/treat chronic skin changes.  28 minutes of total time was spent for this patient encounter, including preparation, face-to-face counseling with the patient and coordination of care, and documentation of the encounter.  Jeral Pinch, MD  Division of Gynecologic Oncology  Department of Obstetrics and Gynecology  [facility]

## 2021-03-04 NOTE — Patient Instructions (Signed)
It was nice to see you today. Please call me after your visit with Dr. Sondra Come if there are any further questions you have about treatment options.  I will plan to see you for follow-up after treatment. Today we discussed using topical treatment for lichen sclerosus to try to help prevent recurrence of your cancer in the future. We will discuss more at your follow-up visit with me after treatment.  My work e-mail, which you are welcome to pass along to the oncologist in [place], is:  [email or web address]

## 2021-03-05 ENCOUNTER — Ambulatory Visit (HOSPITAL_COMMUNITY): Payer: Medicare Other

## 2021-03-06 NOTE — Progress Notes (Signed)
Radiation Oncology         [phone] ________________________________  Outpatient Re-Consultation  Name: [name] Richards MRN: [id number]  Date: 03/07/2021  DOB: Aug 02, 1945  CC:Harlan Stains, MD  [name], MD   REFERRING PHYSICIAN: [name], MD  DIAGNOSIS: Recurrent vaginal squamous cell carcinoma.  Interval Since Last Radiation: 1 year, 4 months, and 25 days     Radiation Treatment Dates: 09/29/2019 through 10/12/2019 Site Technique Total Dose (Gy) Dose per Fx (Gy) Completed Fx Beam Energies  Vagina: Pelvis HDR-brachy 30/30 6 5/5 Ir-192    HISTORY OF PRESENT ILLNESS::[name] Richards is a 76 y.o. female who is accompanied by her son by telephone contact. she is seen as a courtesy of Dr. Berline Lopes for re-evaluation and for an opinion concerning radiation therapy as part of management for her recent recurrence of vaginal cancer. The patient was last seen here for follow up in September of 2022.  Since her last visit, the patient followed up with Dr. Berline Lopes on 02/06/21. During which time, the patient was noted to be doing well, and denied any vaginal bleeding or discharge. She was however noted to report intermittent symptoms related to her lichen sclerosis, for which she does not use treatment, as she does not tolerate clobetasol. Physical exam performed during this visit revealed a significant loss of architecture of the vulva.  Desquamative areas were also appreciated on the inner labia's bilaterally, greater on the left than the right, and mild erythema.  Vaginal length was noted as approximately 2 cm and able to be visualized without a speculum. The patient was noted to have significant tenderness with palpation of her labia. Hyperkeratosis was also appreciated along the posterior aspect of the vagina.  No discrete masses were seen, and no palpable nodularity was felt within the vagina. Given the above findings on physical exam, Dr. Berline Lopes recommended  vaginoscopy with  biopsies. Given her significant tenderness, Dr. Berline Lopes offered performing these under anesthesia which the patient consented to.  Accordingly, the patient underwent vaginal biopsies under anesthesia on 02/14/21. Pathology from the procedure revealed:  --5:00 vaginal biopsy: invasive squamous cell carcinoma  --3:00 vaginal biopsy: focal atypia with underlying inflammation  --12:00 and 7:00 vaginal biopsies: High-grade squamous intraepithelial lesion (VaIN 2-3; moderate to severe dysplasia)  --Peri-clitoral and vaginal apex biopsies: low grade squamous intraepithelial lesion   During follow up visit with Dr. Berline Lopes on 03/04/21, the patient voiced that she strongly prefers avoiding surgery. I spoke with Dr. Berline Lopes and informed her that the patient may be a candidate for further therapy is she wishes to do pursue this.  In the setting of her cancer, and recent biopsies which are p16 negative, Dr. Berline Lopes suspects that her disease arises within the setting of her chronic lichen sclerosus.  The patient was accordingly educated on preventative measures moving forward with using topical steroids to help control/treat chronic skin changes.   Pertinent  imaging thus far includes a restaging PET on 02/28/21 which demonstrated an interval decrease in degree of FDG uptake associated with the vaginal introitus. (SUV max of 5.96, compared with 9.17 previously). Otherwise, no highly concerning features were appreciated to suggest nodal metastasis or distant metastatic disease. However, a mild increase in tracer uptake was seen within and morphologically benign left inguinal lymph node. Stable postoperative changes were also seen within the left upper lobe, without signs suggestive of locally recurrent tumor.  Of note: the patients case was discussed at the multidisciplinary tumor board held on 03/04/21.  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 S/p TAH and radiation x6 wks    Complication of anesthesia    took 2 days to wake up after 1 surgery at 20, pt can't use a adult cath,needs peds. cath due to past injury   Constipation    very worried about this   Endometrial cancer [facility]) 1997   coincidental endometrial cancer also   History of radiation therapy 10/12/2019   [facility] HDR brachytherapy  09/29/2019-10/12/2019  Dr Gery Pray   Hyperlipemia    Hypertension    Hypothyroidism    Primary cancer of left upper lobe of lung ([place]) 08/01/2015   Small bowel obstruction ([place])    12/2008, repeat SBO in 02/2011 also with terminal ilietis    Thyroid disease    Vertebral body hemangioma    T12    PAST SURGICAL HISTORY: Past Surgical History:  Procedure Laterality Date   ABDOMINAL HYSTERECTOMY  Feb 1997   TAH/BSO, pelvic and periaortic lymphadenectomy    BACK SURGERY     EYE SURGERY     Lasik    LYMPH NODE DISSECTION Left 08/01/2015   Procedure: LYMPH NODE DISSECTION;  Surgeon: Melrose Nakayama, MD;  Location: [place];  Service: Thoracic;  Laterality: Left;   PARTIAL NEPHRECTOMY     right   SEGMENTECOMY Left 08/01/2015   Procedure: Left Upper Lobe SEGMENTECTOMY;  Surgeon: Melrose Nakayama, MD;  Location: [place];  Service: Thoracic;  Laterality: Left;   SKIN BIOPSY     TUBAL LIGATION     TUMOR REMOVAL     meningioma  T 12   VAGINECTOMY, PARTIAL N/A 08/11/2019   Procedure: TOTAL VAGINECTOMY;  Surgeon: Everitt Amber, MD;  Location: [facility];  Service: Gynecology;  Laterality: N/A;   VIDEO ASSISTED THORACOSCOPY (VATS)/WEDGE RESECTION Left 08/01/2015   Procedure: VIDEO ASSISTED THORACOSCOPY (VATS)/WEDGE RESECTION;  Surgeon: Melrose Nakayama, MD;  Location: [place];  Service: Thoracic;  Laterality: Left;   VULVA Milagros Loll BIOPSY N/A 02/14/2021   Procedure: VAGINAL AND VULVAR BIOPSY;  Surgeon: [name], MD;  Location: [facility];  Service: Gynecology;  Laterality: N/A;   VULVA SURGERY     x 2, 1 at [facility] (2016), and [facility]     FAMILY HISTORY:  Family History  Problem Relation Age of Onset   Hypertension Other    Diabetes Other    Stroke Other    CAD Other     SOCIAL HISTORY:  Social History   Tobacco Use   Smoking status: Never   Smokeless tobacco: Never  Vaping Use   Vaping Use: Never used  Substance Use Topics   Alcohol use: No   Drug use: No    ALLERGIES:  Allergies  Allergen Reactions   Penicillins Hives, Rash and Other (See Comments)    Has patient had a PCN reaction causing immediate rash, facial/tongue/throat swelling, SOB or lightheadedness with hypotension: NO Has patient had a PCN reaction causing SEVERE RASH INVOLVING MUCUS MEMBRANES OR SKIN NECROSIS: YES Has patient had a PCN reaction that REQUIRED HOSPITALIZATION : patient denies hospitalization Has patient had a PCN reaction occurring within the last 10 years: NO     Fentanyl Other (See Comments)    Other reaction(s): low blood pressure hypotension Other reaction(s): low blood  pressure   Codeine Other (See Comments)    Reporting amnesia Other reaction(s): amnesia   Percocet [Oxycodone-Acetaminophen] Other (See Comments)    Made pt feel real funny, never wanting to take it again; near syncopal episode   Prochlorperazine Other (See Comments)    extrapyramidal reaction Jaws locked   Trazodone Hcl     Other reaction(s): dizziness    MEDICATIONS:  Current Outpatient Medications  Medication Sig Dispense Refill   acyclovir (ZOVIRAX) 800 MG tablet 1 tablet     amLODipine (NORVASC) 5 MG tablet SMARTSIG:2 Tablet(s) By Mouth Every Evening     Ascorbic Acid (VITAMIN C PO) Vitamin C     benazepril-hydrochlorthiazide (LOTENSIN HCT) 20-25 MG tablet Take 1 tablet by mouth daily.      BIOTIN PO Take 1 tablet by mouth 4 (four) times a week.     Cholecalciferol (VITAMIN D3 PO) Take 1 tablet by mouth in the  morning and at bedtime.     docusate sodium (COLACE) 100 MG capsule Take 100 mg by mouth 2 (two) times daily.     Fexofenadine HCl (ALLEGRA PO) Take by mouth as needed.     levothyroxine (SYNTHROID, LEVOTHROID) 75 MCG tablet Take 75 mcg by mouth daily.     loratadine (CLARITIN) 10 MG tablet Take 10 mg by mouth daily as needed for allergies.     MAGNESIUM PO Take 1 tablet by mouth daily.     Multiple Vitamin (MULTIVITAMIN WITH MINERALS) TABS tablet Take 1 tablet by mouth daily.     Omega-3 Fatty Acids (FISH OIL PO) Take 1 capsule by mouth daily.     polyethylene glycol (MIRALAX / GLYCOLAX) packet Take 11.3333 g by mouth at bedtime. 2/3 capful     simvastatin (ZOCOR) 20 MG tablet Take 20 mg by mouth at bedtime.      clobetasol (TEMOVATE) 0.05 % GEL  (Patient not taking: Reported on 03/07/2021)     No current facility-administered medications for this encounter.    REVIEW OF SYSTEMS:  A 10+ POINT REVIEW OF SYSTEMS WAS OBTAINED including neurology, dermatology, psychiatry, cardiac, respiratory, lymph, extremities, GI, GU, musculoskeletal, constitutional, reproductive, HEENT.  She denies any difficulties with urination or urinary incontinence.  She denies any hematuria.  She denies any rectal discomfort or bowel problems at this time.   PHYSICAL EXAM:  height is 5\' 5"  (1.651 m) and weight is 186 lb 2 oz (84.4 kg). Her temporal temperature is 96.1 F (35.6 C) (abnormal). Her blood pressure is 139/61 and her pulse is 61. Her respiration is 18 and oxygen saturation is 100%.   General: Alert and oriented, in no acute distress HEENT: Head is normocephalic. Extraocular movements are intact.  Neck: Neck is supple, no palpable cervical or supraclavicular lymphadenopathy. Heart: Regular in rate and rhythm with no murmurs, rubs, or gallops. Chest: Clear to auscultation bilaterally, with no rhonchi, wheezes, or rales. Abdomen: Soft, nontender, nondistended, with no rigidity or guarding. Extremities: No  cyanosis or edema. Lymphatics: see Neck Exam Skin: No concerning lesions. Musculoskeletal: symmetric strength and muscle tone throughout. Neurologic: Cranial nerves II through XII are grossly intact. No obvious focalities. Speech is fluent. Coordination is intact. Psychiatric: Judgment and insight are intact. Affect is appropriate. On Pelvic examination there is some erythema to the surfaces of the inner labia.  Biopsy changes are noted along the significantly shortened vaginal vault which is estimated to be approximately 2 cm in length.  No signs of infection within the vaginal vault.  On bimanual examination  there are no obvious pelvic masses.  The vaginal mucosa feels smooth with palpation.    ECOG = 1  0 - Asymptomatic (Fully active, able to carry on all predisease activities without restriction)  1 - Symptomatic but completely ambulatory (Restricted in physically strenuous activity but ambulatory and able to carry out work of a light or sedentary nature. For example, light housework, office work)  2 - Symptomatic, <50% in bed during the day (Ambulatory and capable of all self care but unable to carry out any work activities. Up and about more than 50% of waking hours)  3 - Symptomatic, >50% in bed, but not bedbound (Capable of only limited self-care, confined to bed or chair 50% or more of waking hours)  4 - Bedbound (Completely disabled. Cannot carry on any self-care. Totally confined to bed or chair)  5 - Death   Eustace Pen MM, Creech RH, Tormey DC, et al. [phone]). "Toxicity and response criteria of the [facility]". [place] Oncol. 5 (6): 649-55  LABORATORY DATA:  Lab Results  Component Value Date   WBC 4.7 08/04/2019   HGB 13.3 02/14/2021   HCT 39.0 02/14/2021   MCV 69.5 (L) 08/04/2019   PLT 227 08/04/2019   NEUTROABS 2.7 09/13/2018   Lab Results  Component Value Date   NA 134 (L) 02/14/2021   K 3.8 02/14/2021   CL 101 02/14/2021   CO2 28  08/04/2019   GLUCOSE 105 (H) 02/14/2021   CREATININE 0.50 02/14/2021   CALCIUM 9.0 08/04/2019      RADIOGRAPHY: NM PET Image Restage (PS) Skull Base to Thigh (F-18 FDG)  Result Date: 03/01/2021 CLINICAL DATA:  Subsequent treatment strategy for recurrent vulvar cancer. EXAM: NUCLEAR MEDICINE PET SKULL BASE TO THIGH TECHNIQUE: 7.5 mCi F-18 FDG was injected intravenously. Full-ring PET imaging was performed from the skull base to thigh after the radiotracer. CT data was obtained and used for attenuation correction and anatomic localization. Fasting blood glucose: 108 mg/dl COMPARISON:  09/13/2019 FINDINGS: Mediastinal blood pool activity: SUV max 2.94 Liver activity: SUV max NA NECK: No hypermetabolic lymph nodes in the neck. Incidental CT findings: New focus of increased radiotracer uptake within the left posterior mandible localizing to the posterior molar where a periapical hypodensity is identified possibly representing a periapical abscess. CHEST: No hypermetabolic mediastinal or hilar nodes. No suspicious pulmonary nodules on the CT scan. Incidental CT findings: Unchanged tiny nodule in the periphery of the right lower lobe measuring 3 mm, image 36/8. This is too small to reliably characterize by PET-CT. Stable postsurgical changes within the left upper lobe. Coronary artery calcifications and aortic atherosclerosis. ABDOMEN/PELVIS: No abnormal hypermetabolic activity within the liver, pancreas, adrenal glands, or spleen. No hypermetabolic lymph nodes in the abdomen or pelvis. As noted on the previous exam there is increased radiotracer activity near the vaginal introitus. On today's exam the SUV max is equal to 5.96. This is compared with 9.17 on the previous exam. Morphologically benign appearing left inguinal lymph node 0.9 cm and has mild tracer uptake with an SUV max 2.97, image 170/4. Previously this measured 0.9 cm within SUV max of 1.8. Incidental CT findings: Previous retroperitoneal and  bilateral pelvic node dissection. Status post hysterectomy. Aortic atherosclerotic calcifications. No ascites. SKELETON: No focal hypermetabolic activity to suggest skeletal metastasis. Incidental CT findings: none IMPRESSION: 1. Interval decrease in degree of FDG uptake associated with the vaginal introitus. The SUV max on today's study is equal to 5.96 compared with 9.17 previously. 2.  No highly concerning features to suggest nodal metastasis or distant metastatic disease. Mildly increased tracer uptake within and morphologically benign left inguinal lymph node is identified which warrants attention on follow-up imaging. 3. Stable postoperative changes within the left upper lobe without signs to suggest locally recurrent tumor. 4. Aortic Atherosclerosis (ICD10-I70.0). Coronary artery calcifications. Electronically Signed   By: Kerby Moors M.D.   On: 03/01/2021 07:49      IMPRESSION: Recurrent vaginal squamous cell carcinoma.  We discussed management options in detail today.  Her son was present through phone contact.  We discussed potential surgical option that Dr. Berline Lopes had discussed with her.  We also discussed that she would not be a good candidate for additional brachytherapy in light of her recent course of brachytherapy.  Since it has been over 20 years since her previous external beam radiation therapy and the anticipated external beam radiation therapy for this situation will be very low in the pelvis without significant bowel coverage, she would be a candidate for treatments with external beam radiation therapy..  We discussed this would possibly be about 5 to 6 weeks in length.  In treating this area patient would have more symptoms than with her brachytherapy including bladder and rectal discomfort.  We also discussed that she would be at increased risk for long-term complications involving the urethra bladder rectum and anus.  She potentially could develop significant urinary incontinence as  well as fecal incontinence from this treatment.  Fistula development would also be a potential risk of this treatment.  After careful consideration patient would like to proceed with external beam radiation therapy for management of her  recurrent vaginal squamous cell carcinoma.   PLAN: Patient will be scheduled for CT simulation in the near future.  Anticipate 5 to 6 weeks of external beam radiation therapy directed at the lower pelvis and perineal area.   55 minutes of total time was spent for this patient encounter, including preparation, face-to-face counseling with the patient and coordination of care, physical exam, and documentation of the encounter.   ------------------------------------------------  Blair Promise, PhD, MD  This document serves as a record of services personally performed by Gery Pray, MD. It was created on his behalf by Roney Mans, a trained medical scribe. The creation of this record is based on the scribe's personal observations and the provider's statements to them. This document has been checked and approved by the attending provider.

## 2021-03-07 ENCOUNTER — Ambulatory Visit
Admission: RE | Admit: 2021-03-07 | Discharge: 2021-03-07 | Disposition: A | Discharge status: Home / Self Care | Payer: Medicare Other | Source: Ambulatory Visit | Attending: Radiation Oncology | Admitting: Radiation Oncology

## 2021-03-07 ENCOUNTER — Institutional Professional Consult (permissible substitution): Payer: Medicare Other | Admitting: Radiation Oncology

## 2021-03-07 ENCOUNTER — Encounter: Payer: Self-pay | Admitting: Radiation Oncology

## 2021-03-07 ENCOUNTER — Other Ambulatory Visit: Payer: Self-pay

## 2021-03-07 VITALS — BP 139/61 | HR 61 | Temp 96.1°F | Resp 18 | Ht 65.0 in | Wt 186.1 lb

## 2021-03-07 DIAGNOSIS — C3412 Malignant neoplasm of upper lobe, left bronchus or lung: Secondary | ICD-10-CM

## 2021-03-07 DIAGNOSIS — Z79899 Other long term (current) drug therapy: Secondary | ICD-10-CM | POA: Diagnosis not present

## 2021-03-07 DIAGNOSIS — C539 Malignant neoplasm of cervix uteri, unspecified: Secondary | ICD-10-CM

## 2021-03-07 DIAGNOSIS — I700 Atherosclerosis of aorta: Secondary | ICD-10-CM | POA: Diagnosis not present

## 2021-03-07 DIAGNOSIS — Z923 Personal history of irradiation: Secondary | ICD-10-CM | POA: Insufficient documentation

## 2021-03-07 DIAGNOSIS — C519 Malignant neoplasm of vulva, unspecified: Secondary | ICD-10-CM

## 2021-03-07 DIAGNOSIS — I10 Essential (primary) hypertension: Secondary | ICD-10-CM | POA: Diagnosis not present

## 2021-03-07 DIAGNOSIS — C549 Malignant neoplasm of corpus uteri, unspecified: Secondary | ICD-10-CM

## 2021-03-07 DIAGNOSIS — E785 Hyperlipidemia, unspecified: Secondary | ICD-10-CM | POA: Diagnosis not present

## 2021-03-07 DIAGNOSIS — Z8589 Personal history of malignant neoplasm of other organs and systems: Secondary | ICD-10-CM | POA: Insufficient documentation

## 2021-03-07 DIAGNOSIS — E039 Hypothyroidism, unspecified: Secondary | ICD-10-CM | POA: Insufficient documentation

## 2021-03-07 NOTE — Progress Notes (Signed)
See MD note for nursing evaluation. °

## 2021-03-08 ENCOUNTER — Encounter: Payer: Self-pay | Admitting: Gynecologic Oncology

## 2021-03-09 ENCOUNTER — Encounter: Payer: Self-pay | Admitting: Radiation Oncology

## 2021-03-11 ENCOUNTER — Telehealth: Payer: Self-pay | Admitting: Oncology

## 2021-03-11 ENCOUNTER — Encounter: Payer: Self-pay | Admitting: Gynecologic Oncology

## 2021-03-11 DIAGNOSIS — C52 Malignant neoplasm of vagina: Secondary | ICD-10-CM | POA: Insufficient documentation

## 2021-03-11 NOTE — Telephone Encounter (Signed)
Left a message regarding telephone appointment with Dr .Berline Lopes tomorrow at 5:00.  Also advised that Dr. Berline Lopes may call earlier in between her surgery cases. Requested a return call with any questions.

## 2021-03-12 ENCOUNTER — Telehealth: Payer: Medicare Other | Admitting: Gynecologic Oncology

## 2021-03-12 ENCOUNTER — Inpatient Hospital Stay (HOSPITAL_BASED_OUTPATIENT_CLINIC_OR_DEPARTMENT_OTHER): Payer: Medicare Other | Admitting: Gynecologic Oncology

## 2021-03-12 ENCOUNTER — Encounter: Payer: Self-pay | Admitting: Gynecologic Oncology

## 2021-03-12 DIAGNOSIS — C519 Malignant neoplasm of vulva, unspecified: Secondary | ICD-10-CM

## 2021-03-12 DIAGNOSIS — D072 Carcinoma in situ of vagina: Secondary | ICD-10-CM

## 2021-03-12 DIAGNOSIS — Z7189 Other specified counseling: Secondary | ICD-10-CM

## 2021-03-12 DIAGNOSIS — D071 Carcinoma in situ of vulva: Secondary | ICD-10-CM | POA: Diagnosis not present

## 2021-03-12 DIAGNOSIS — C52 Malignant neoplasm of vagina: Secondary | ICD-10-CM

## 2021-03-13 ENCOUNTER — Telehealth: Payer: Self-pay | Admitting: Oncology

## 2021-03-13 ENCOUNTER — Encounter: Payer: Self-pay | Admitting: Gynecologic Oncology

## 2021-03-13 NOTE — Telephone Encounter (Signed)
Called CT [facility] and advised patient will be going for surgery with Dr. Berline Lopes on 03/20/21 so CT [facility] for tomorrow needs to be canceled.

## 2021-03-13 NOTE — Progress Notes (Signed)
Gynecologic Oncology Telehealth Consult Note: Gyn-Onc  I connected with [name] Richards on 03/13/21 at  5:00 PM EST by telephone and verified that I am speaking with the correct person using two identifiers.  I discussed the limitations, risks, security and privacy concerns of performing an evaluation and management service by telemedicine and the availability of in-person appointments. I also discussed with the patient that there may be a patient responsible charge related to this service. The patient expressed understanding and agreed to proceed.  Other persons participating in the visit and their role in the encounter: none.  Patient's location: home Provider's location: [facility]  Reason for Visit: treatment discussion  Treatment History: Oncology History Overview Note  Patient followed due to HX pulmonary nodules noted 2011  Primary cancer of left upper lobe of lung ([place])   Staging form: Lung, AJCC 7th Edition   - Clinical stage from 09/27/2015: Stage IA (T1b, N0, M0) - Signed by Curt Bears, MD on 09/27/2015    Primary cancer of left upper lobe of lung ([place])  07/12/2015 Imaging   CT Chest IMPRESSION:  The solid component of the large part solid nodule in the left upper lobe measures 9 x 3 mm (mean diameter 6 mm.) persistent part solid nodules with solid components greater than or equal to 6 mm should be considered highly suspicious for pulmonary adenocarcinoma   08/01/2015 Initial Diagnosis   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 - THE SURGICAL RESECTION MARGINS ARE NEGATIVE FOR  CARCINOMA. 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 She underwent a total abdominal hysterectomy and bilateral salpingo-oophorectomy, pelvic and para-aortic lymphadenectomy. She received postoperative whole pelvis radiation therapy.  Her other primary gynecologic problem has been lichen sclerosus managed with when necessary clobetasol.   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   In December 2016 the patient was found to have a new primary squamous cell carcinoma the vulva undergoing a modified radical vulvectomy on 02/06/2015. She underwent a modified radical vulvectomy at [facility] on 02/06/2015. She was found to have a 3 cm vulvar lesion of the posterior vulva extending into the posterior vagina. A rhomboid flap was required to close the incision.  Final pathology showed some close surgical margins. However, the specimen margins were toward the time of surgery and it was Dr Mora Bellman impression that they were widely around the primary cancer. She discussed options with the patient and they agreed to monitor her closely but not recommend any radiation therapy to the vulva.   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  Depth of invasion was 4 mm all margins were negative.  Follow-up PET  scan March 23, 2017 showed no evidence of metastatic disease.   Examination of the vulva during routine surveillance on 07/20/19 showed a friable, verrucous lesion measuring approximately 2 cm was seen on the anterior wall of the suburethral vagina extending to the introitus and wrapping around to the right mid vagina. This was concerning for  recurrence and was biopsied at that appointment. 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    On 08/11/19 she underwent total vaginectomy. Intraoperative findings were significant for a forshortened 3cm vagina, friable 4cm plaque on the anterior vagina extending to the right fornix and left posterior vaginal wall. This necessitated a total vaginectomy to resect the lesion grossly. Her tissues were extremely friable due to age, prior radiation, menopause and lichen sclerosis. There was unavoidable fragmentation of the tissues during the resection. All gross visible disease was removed. Surgery was uncomplicated, and she reported no postop pain.  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 The suspicious foci are present at the edges of tissue fragments.  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    Post-op PET scan in August, 2021 showed no metastatic disease.   The patient received adjuvant vaginal brachytherapy between 09/29/2019 through 10/12/2019.  She tolerated treatment very well with no toxicities.   I saw the patient on 12/28.  At the time of her visit, I was concerned for dysplastic changes versus radiation treatment within the vagina and on her vulva.   On 02/14/2021, the patient underwent exam under anesthesia, vaginal and vulvar biopsies.  Findings were notable for anatomic changes related to prior surgical treatment and radiation.  Significant atrophy was noted.  Multiple spots of hyperkeratosis versus acetowhite after application of acetic acid were found, all biopsied.  Final pathology revealed invasive squamous cell carcinoma at 5:00 of the vaginal biopsy.  Multiple other biopsies including a 1:00 periclitoral biopsy and apex of the vagina showed low-grade squamous  intraepithelial lesion.  Biopsy from 7:00 within the vagina showed high-grade squamous intraepithelial lesion.   PET scan was performed on 02/28/2021.  There was noted to be interval decrease in degree of FDG uptake within the vaginal introitus compared to PET scan in 2021 after her last surgery.  No concerning findings for metastatic disease.  Comment was noted that there is mild uptake within a left inguinal lymph node but the morphology is benign.  Imaging was reviewed at our tumor conference on 1/23 with discussion being findings did not represent metastatic disease.  Interval History: Patient met with Dr. Sondra Come and after discussion with him would like to consider surgical intervention. Denies new symptoms.   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 S/p TAH and radiation x6 wks    Complication of anesthesia    took 2 days to wake up after 1 surgery at 20, pt can't use a adult cath,needs peds. cath due to past injury   Constipation    very worried about this   Endometrial cancer [facility]) 1997   coincidental endometrial cancer also   History of radiation therapy 10/12/2019   [facility] HDR brachytherapy  09/29/2019-10/12/2019  Dr Gery Pray   Hyperlipemia    Hypertension  Hypothyroidism    Primary cancer of left upper lobe of lung ([place]) 08/01/2015   Small bowel obstruction ([place])    12/2008, repeat SBO in 02/2011 also with terminal ilietis    Thyroid disease    Vertebral body hemangioma    T12    Past Surgical History:  Procedure Laterality Date   ABDOMINAL HYSTERECTOMY  Feb 1997   TAH/BSO, pelvic and periaortic lymphadenectomy    BACK SURGERY     EYE SURGERY     Lasik    LYMPH NODE DISSECTION Left 08/01/2015   Procedure: LYMPH NODE DISSECTION;  Surgeon: Melrose Nakayama, MD;  Location: [place];  Service:  Thoracic;  Laterality: Left;   PARTIAL NEPHRECTOMY     right   SEGMENTECOMY Left 08/01/2015   Procedure: Left Upper Lobe SEGMENTECTOMY;  Surgeon: Melrose Nakayama, MD;  Location: [place];  Service: Thoracic;  Laterality: Left;   SKIN BIOPSY     TUBAL LIGATION     TUMOR REMOVAL     meningioma  T 12   VAGINECTOMY, PARTIAL N/A 08/11/2019   Procedure: TOTAL VAGINECTOMY;  Surgeon: Everitt Amber, MD;  Location: [facility];  Service: Gynecology;  Laterality: N/A;   VIDEO ASSISTED THORACOSCOPY (VATS)/WEDGE RESECTION Left 08/01/2015   Procedure: VIDEO ASSISTED THORACOSCOPY (VATS)/WEDGE RESECTION;  Surgeon: Melrose Nakayama, MD;  Location: [place];  Service: Thoracic;  Laterality: Left;   VULVA Milagros Loll BIOPSY N/A 02/14/2021   Procedure: VAGINAL AND VULVAR BIOPSY;  Surgeon: [name], MD;  Location: [facility];  Service: Gynecology;  Laterality: N/A;   VULVA SURGERY     x 2, 1 at [facility] (2016), and [facility]     Family History  Problem Relation Age of Onset   Hypertension Other    Diabetes Other    Stroke Other    CAD Other     Social History   Socioeconomic History   Marital status: Divorced    Spouse name: Not on file   Number of children: Not on file   Years of education: Not on file   Highest education level: Not on file  Occupational History   Not on file  Tobacco Use   Smoking status: Never   Smokeless tobacco: Never  Vaping Use   Vaping Use: Never used  Substance and Sexual Activity   Alcohol use: No   Drug use: No   Sexual activity: Never  Other Topics Concern   Not on file  Social History Narrative   Lives at home alone, works out at [employer] 6 days a week, still very active. Has an ex-husband who she is in contact with. Son died of an MI and another son in [place].    Social Determinants of Health   Financial Resource Strain: Not on file  Food Insecurity: Not on file  Transportation Needs: Not on file  Physical Activity: Not on file   Stress: Not on file  Social Connections: Not on file    Current Medications:  Current Outpatient Medications:    acyclovir (ZOVIRAX) 800 MG tablet, 1 tablet, Disp: , Rfl:    amLODipine (NORVASC) 5 MG tablet, SMARTSIG:2 Tablet(s) By Mouth Every Evening, Disp: , Rfl:    Ascorbic Acid (VITAMIN C PO), Vitamin C, Disp: , Rfl:    benazepril-hydrochlorthiazide (LOTENSIN HCT) 20-25 MG tablet, Take 1 tablet by mouth daily. , Disp: , Rfl:    BIOTIN PO, Take 1 tablet by mouth 4 (four) times a week., Disp: ,  Rfl:    Cholecalciferol (VITAMIN D3 PO), Take 1 tablet by mouth in the morning and at bedtime., Disp: , Rfl:    clobetasol (TEMOVATE) 0.05 % GEL, , Disp: , Rfl:    docusate sodium (COLACE) 100 MG capsule, Take 100 mg by mouth 2 (two) times daily., Disp: , Rfl:    Fexofenadine HCl (ALLEGRA PO), Take by mouth as needed., Disp: , Rfl:    levothyroxine (SYNTHROID, LEVOTHROID) 75 MCG tablet, Take 75 mcg by mouth daily., Disp: , Rfl:    loratadine (CLARITIN) 10 MG tablet, Take 10 mg by mouth daily as needed for allergies., Disp: , Rfl:    MAGNESIUM PO, Take 1 tablet by mouth daily., Disp: , Rfl:    Multiple Vitamin (MULTIVITAMIN WITH MINERALS) TABS tablet, Take 1 tablet by mouth daily., Disp: , Rfl:    Omega-3 Fatty Acids (FISH OIL PO), Take 1 capsule by mouth daily., Disp: , Rfl:    polyethylene glycol (MIRALAX / GLYCOLAX) packet, Take 11.3333 g by mouth at bedtime. 2/3 capful, Disp: , Rfl:    simvastatin (ZOCOR) 20 MG tablet, Take 20 mg by mouth at bedtime. , Disp: , Rfl:   Review of Symptoms: Pertinent positives as per HPI.  Physical Exam: There were no vitals taken for this visit. Deferred given limitations of phone visit.   Laboratory & Radiologic Studies: None new  Assessment & Plan: [name] Richards is a 76 y.o. 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  Discussed her recent visit with Dr. Sondra Come. She is worried about side effects related to radiation.   We discussed challenge of surgical intervention in the setting of prior surgeries and RT. She has no interest in exenterative procedure. Maintaining quality of life is of utmost importance to her, even if this were to mean having positives margins to not risk injury to bowel or other surrounding structures.   The plain that we have discussed is to attempt limited resection of area that is biopsy proven cancer recurrence and to treat areas with high grade dysplasia with laser therapy. I will also plan to take some additional biopsies.   I reviewed in detail postoperative recovery and expectations.   I stressed that pending pathology (margin status, size and depth of tumor, LVI), I may still recommend adjuvant RT. She voiced understanding of this. CT simulation should be canceled and we can always get her rescheduled after surgery if needed.   I discussed the assessment and treatment plan with the patient. The patient was provided with an opportunity to ask questions and all were answered. The patient agreed with the plan and demonstrated an understanding of the instructions.   The patient was advised to call back or see an in-person evaluation if the symptoms worsen or if the condition fails to improve as anticipated.   30 minutes of total time was spent for this patient encounter, including preparation, face-to-face counseling with the patient and coordination of care, and documentation of the encounter.   Jeral Pinch, MD  Division of Gynecologic Oncology  Department of Obstetrics and Gynecology  [facility]

## 2021-03-14 ENCOUNTER — Encounter (HOSPITAL_BASED_OUTPATIENT_CLINIC_OR_DEPARTMENT_OTHER): Payer: Self-pay | Admitting: Gynecologic Oncology

## 2021-03-14 ENCOUNTER — Other Ambulatory Visit: Payer: Self-pay

## 2021-03-14 ENCOUNTER — Ambulatory Visit: Payer: Medicare Other | Admitting: Radiation Oncology

## 2021-03-14 NOTE — Progress Notes (Signed)
Spoke w/ via phone for pre-op interview---patient Lab needs dos----CBC and BMP               Lab results------EKG 02/14/21 COVID test -----patient states asymptomatic no test needed. Arrive at -------09:15am 03/20/21 NPO after MN NO Solid Food.  Clear liquids from MN until 08:15am Med rec completed. Medications to take morning of surgery -----Synthroid and colace. Hold all vitamins and supplements 5 days prior to sx. Diabetic medication -----NA Patient instructed no nail polish to be worn day of surgery. Patient instructed to bring photo id and insurance card day of surgery. Patient aware to have Driver (ride) Engineer, civil (consulting) for 24 hours after surgery.  Patient Special Instructions -----NA Pre-Op special Istructions -----NS Patient verbalized understanding of instructions that were given at this phone interview. Patient denies shortness of breath, chest pain, fever, cough at this phone interview.

## 2021-03-19 ENCOUNTER — Telehealth: Payer: Self-pay

## 2021-03-19 ENCOUNTER — Other Ambulatory Visit: Payer: Self-pay | Admitting: Gynecologic Oncology

## 2021-03-19 DIAGNOSIS — C52 Malignant neoplasm of vagina: Secondary | ICD-10-CM

## 2021-03-19 MED ORDER — SENNOSIDES-DOCUSATE SODIUM 8.6-50 MG PO TABS: 2.0000 | ORAL_TABLET | Freq: Every day | ORAL | 0 refills | Status: DC

## 2021-03-19 MED ORDER — TRAMADOL HCL 50 MG PO TABS: 50.0000 mg | ORAL_TABLET | Freq: Four times a day (QID) | ORAL | 0 refills | Status: DC | PRN

## 2021-03-19 NOTE — Anesthesia Preprocedure Evaluation (Addendum)
Anesthesia Evaluation  Patient identified by MRN, date of birth, ID band Patient awake    Reviewed: Allergy & Precautions, NPO status , Patient's Chart, lab work & pertinent test results  History of Anesthesia Complications (+) PROLONGED EMERGENCE and history of anesthetic complications  Airway Mallampati: II  TM Distance: >3 FB Neck ROM: Full    Dental  (+) Teeth Intact, Dental Advisory Given   Pulmonary asthma ,  Primary cancer of left upper lobe of lung   Pulmonary exam normal breath sounds clear to auscultation       Cardiovascular hypertension, Pt. on medications Normal cardiovascular exam Rhythm:Regular Rate:Normal     Neuro/Psych negative neurological ROS     GI/Hepatic negative GI ROS, Neg liver ROS,   Endo/Other  Hypothyroidism   Renal/GU Renal disease (s/p right partial nephrectomy )     Musculoskeletal negative musculoskeletal ROS (+)   Abdominal   Peds  Hematology negative hematology ROS (+)   Anesthesia Other Findings   Reproductive/Obstetrics recurrent vaginal cancer                            Anesthesia Physical Anesthesia Plan  ASA: 3  Anesthesia Plan: General   Post-op Pain Management: Tylenol PO (pre-op)   Induction: Intravenous  PONV Risk Score and Plan: 4 or greater and Dexamethasone, Ondansetron and Treatment may vary due to age or medical condition  Airway Management Planned: LMA  Additional Equipment:   Intra-op Plan:   Post-operative Plan: Extubation in OR  Informed Consent: I have reviewed the patients History and Physical, chart, labs and discussed the procedure including the risks, benefits and alternatives for the proposed anesthesia with the patient or authorized representative who has indicated his/her understanding and acceptance.     Dental advisory given  Plan Discussed with: CRNA  Anesthesia Plan Comments:        Anesthesia  Quick Evaluation

## 2021-03-19 NOTE — Progress Notes (Signed)
Post-op meds sent in pre-operatively.

## 2021-03-19 NOTE — Telephone Encounter (Signed)
Telephone call to check on pre-operative status.  Patient compliant with pre-operative instructions.  Reinforced nothing to eat after midnight. Clear liquids until 0815. Patient to arrive at 0915.  No questions or concerns voiced.  Instructed to call for any needs.

## 2021-03-20 ENCOUNTER — Other Ambulatory Visit: Payer: Self-pay

## 2021-03-20 ENCOUNTER — Encounter (HOSPITAL_BASED_OUTPATIENT_CLINIC_OR_DEPARTMENT_OTHER): Payer: Self-pay | Admitting: Gynecologic Oncology

## 2021-03-20 ENCOUNTER — Ambulatory Visit (HOSPITAL_BASED_OUTPATIENT_CLINIC_OR_DEPARTMENT_OTHER): Payer: Medicare Other | Admitting: Anesthesiology

## 2021-03-20 ENCOUNTER — Encounter (HOSPITAL_BASED_OUTPATIENT_CLINIC_OR_DEPARTMENT_OTHER)
Admission: RE | Disposition: A | Discharge status: Home / Self Care | Payer: Self-pay | Source: Home / Self Care | Attending: Gynecologic Oncology

## 2021-03-20 ENCOUNTER — Ambulatory Visit (HOSPITAL_BASED_OUTPATIENT_CLINIC_OR_DEPARTMENT_OTHER)
Admission: RE | Admit: 2021-03-20 | Discharge: 2021-03-20 | Disposition: A | Discharge status: Home / Self Care | Payer: Medicare Other | Attending: Gynecologic Oncology | Admitting: Gynecologic Oncology

## 2021-03-20 DIAGNOSIS — Z905 Acquired absence of kidney: Secondary | ICD-10-CM | POA: Insufficient documentation

## 2021-03-20 DIAGNOSIS — I10 Essential (primary) hypertension: Secondary | ICD-10-CM | POA: Diagnosis not present

## 2021-03-20 DIAGNOSIS — N952 Postmenopausal atrophic vaginitis: Secondary | ICD-10-CM | POA: Diagnosis not present

## 2021-03-20 DIAGNOSIS — E039 Hypothyroidism, unspecified: Secondary | ICD-10-CM | POA: Diagnosis not present

## 2021-03-20 DIAGNOSIS — N903 Dysplasia of vulva, unspecified: Secondary | ICD-10-CM

## 2021-03-20 DIAGNOSIS — J45909 Unspecified asthma, uncomplicated: Secondary | ICD-10-CM | POA: Insufficient documentation

## 2021-03-20 DIAGNOSIS — C3412 Malignant neoplasm of upper lobe, left bronchus or lung: Secondary | ICD-10-CM | POA: Diagnosis present

## 2021-03-20 DIAGNOSIS — Z8540 Personal history of malignant neoplasm of unspecified female genital organ: Secondary | ICD-10-CM | POA: Diagnosis not present

## 2021-03-20 DIAGNOSIS — C52 Malignant neoplasm of vagina: Secondary | ICD-10-CM | POA: Diagnosis not present

## 2021-03-20 DIAGNOSIS — D072 Carcinoma in situ of vagina: Secondary | ICD-10-CM

## 2021-03-20 DIAGNOSIS — N765 Ulceration of vagina: Secondary | ICD-10-CM | POA: Insufficient documentation

## 2021-03-20 DIAGNOSIS — L929 Granulomatous disorder of the skin and subcutaneous tissue, unspecified: Secondary | ICD-10-CM | POA: Diagnosis not present

## 2021-03-20 HISTORY — PX: VAGINECTOMY, PARTIAL: SHX6846

## 2021-03-20 HISTORY — PX: CO2 LASER APPLICATION: SHX5778

## 2021-03-20 LAB — BASIC METABOLIC PANEL
Anion gap: 9 (ref 5–15)
BUN: 19 mg/dL (ref 8–23)
CO2: 26 mmol/L (ref 22–32)
Calcium: 9.4 mg/dL (ref 8.9–10.3)
Chloride: 99 mmol/L (ref 98–111)
Creatinine, Ser: 0.58 mg/dL (ref 0.44–1.00)
GFR, Estimated: >60 mL/min (ref >60)
Glucose, Bld: 113 mg/dL — ABNORMAL HIGH (ref 70–99)
Potassium: 3.5 mmol/L (ref 3.5–5.1)
Sodium: 134 mmol/L — ABNORMAL LOW (ref 135–145)

## 2021-03-20 LAB — CBC
HCT: 38.8 % (ref 36.0–46.0)
Hemoglobin: 11.9 g/dL — ABNORMAL LOW (ref 12.0–15.0)
MCH: 21.6 pg — ABNORMAL LOW (ref 26.0–34.0)
MCHC: 30.7 g/dL (ref 30.0–36.0)
MCV: 70.5 fL — ABNORMAL LOW (ref 80.0–100.0)
Platelets: 205 10*3/uL (ref 150–400)
RBC: 5.5 MIL/uL — ABNORMAL HIGH (ref 3.87–5.11)
RDW: 15 % (ref 11.5–15.5)
WBC: 4.4 10*3/uL (ref 4.0–10.5)
nRBC: 0 % (ref 0.0–0.2)

## 2021-03-20 SURGERY — VAGINECTOMY, PARTIAL
Anesthesia: General | Site: Vagina

## 2021-03-20 MED ORDER — DEXAMETHASONE SODIUM PHOSPHATE 10 MG/ML IJ SOLN
INTRAMUSCULAR | Status: AC
  Filled 2021-03-20: qty 1

## 2021-03-20 MED ORDER — ACETAMINOPHEN 500 MG PO TABS
ORAL_TABLET | ORAL | Status: AC
  Filled 2021-03-20: qty 2

## 2021-03-20 MED ORDER — LACTATED RINGERS IV SOLN: INTRAVENOUS | Status: DC

## 2021-03-20 MED ORDER — ONDANSETRON HCL 4 MG/2ML IJ SOLN
INTRAMUSCULAR | Status: AC
  Filled 2021-03-20: qty 2

## 2021-03-20 MED ORDER — LIDOCAINE HCL (PF) 2 % IJ SOLN
INTRAMUSCULAR | Status: AC
  Filled 2021-03-20: qty 5

## 2021-03-20 MED ORDER — BUPIVACAINE HCL 0.25 % IJ SOLN
INTRAMUSCULAR | Status: DC | PRN
  Administered 2021-03-20: 18 mL

## 2021-03-20 MED ORDER — PROPOFOL 10 MG/ML IV BOLUS
INTRAVENOUS | Status: DC | PRN
  Administered 2021-03-20: 130 mg via INTRAVENOUS

## 2021-03-20 MED ORDER — FENTANYL CITRATE (PF) 100 MCG/2ML IJ SOLN
INTRAMUSCULAR | Status: DC | PRN
  Administered 2021-03-20: 25 ug via INTRAVENOUS
  Administered 2021-03-20: 50 ug via INTRAVENOUS

## 2021-03-20 MED ORDER — ONDANSETRON HCL 4 MG/2ML IJ SOLN
INTRAMUSCULAR | Status: DC | PRN
Start: 2021-03-20 — End: 2021-03-20
  Administered 2021-03-20: 4 mg via INTRAVENOUS

## 2021-03-20 MED ORDER — ONDANSETRON HCL 4 MG/2ML IJ SOLN: 4.0000 mg | Freq: Once | INTRAMUSCULAR | Status: DC | PRN

## 2021-03-20 MED ORDER — STERILE WATER FOR IRRIGATION IR SOLN
Status: DC | PRN
  Administered 2021-03-20: 500 mL

## 2021-03-20 MED ORDER — ACETAMINOPHEN 500 MG PO TABS
1000.0000 mg | ORAL_TABLET | ORAL | Status: AC
  Administered 2021-03-20: 1000 mg via ORAL

## 2021-03-20 MED ORDER — FENTANYL CITRATE (PF) 100 MCG/2ML IJ SOLN: 25.0000 ug | INTRAMUSCULAR | Status: DC | PRN

## 2021-03-20 MED ORDER — DEXAMETHASONE SODIUM PHOSPHATE 10 MG/ML IJ SOLN: 4.0000 mg | INTRAMUSCULAR | Status: DC

## 2021-03-20 MED ORDER — FENTANYL CITRATE (PF) 100 MCG/2ML IJ SOLN
INTRAMUSCULAR | Status: AC
  Filled 2021-03-20: qty 2

## 2021-03-20 MED ORDER — DEXAMETHASONE SODIUM PHOSPHATE 10 MG/ML IJ SOLN
INTRAMUSCULAR | Status: DC | PRN
  Administered 2021-03-20: 5 mg via INTRAVENOUS

## 2021-03-20 MED ORDER — 0.9 % SODIUM CHLORIDE (POUR BTL) OPTIME
TOPICAL | Status: DC | PRN
  Administered 2021-03-20: 500 mL

## 2021-03-20 MED ORDER — LIDOCAINE HCL (PF) 1 % IJ SOLN
INTRAMUSCULAR | Status: DC | PRN
  Administered 2021-03-20: 60 mg

## 2021-03-20 MED ORDER — MIDAZOLAM HCL 2 MG/2ML IJ SOLN
INTRAMUSCULAR | Status: AC
  Filled 2021-03-20: qty 2

## 2021-03-20 MED ORDER — SILVER SULFADIAZINE 1 % EX CREA
TOPICAL_CREAM | CUTANEOUS | Status: DC | PRN
  Administered 2021-03-20: 1 via TOPICAL

## 2021-03-20 SURGICAL SUPPLY — 40 items
BLADE CLIPPER SENSICLIP SURGIC (BLADE) IMPLANT
BLADE SURG 15 STRL LF DISP TIS (BLADE) ×1 IMPLANT
BLADE SURG 15 STRL SS (BLADE) ×2
CANISTER SUCT 1200ML W/VALVE (MISCELLANEOUS) IMPLANT
CATH ROBINSON RED A/P 16FR (CATHETERS) ×1 IMPLANT
DEPRESSOR TONGUE BLADE STERILE (MISCELLANEOUS) ×2 IMPLANT
DRSG TELFA 3X8 NADH (GAUZE/BANDAGES/DRESSINGS) IMPLANT
ELECT BALL LEEP 3MM BLK (ELECTRODE) IMPLANT
GAUZE 4X4 16PLY ~~LOC~~+RFID DBL (SPONGE) ×4 IMPLANT
GLOVE SURG ENC MOIS LTX SZ6 (GLOVE) ×4 IMPLANT
GOWN STRL REUS W/TWL LRG LVL3 (GOWN DISPOSABLE) ×3 IMPLANT
KIT TURNOVER CYSTO (KITS) ×2 IMPLANT
NDL HYPO 25X1 1.5 SAFETY (NEEDLE) ×1 IMPLANT
NEEDLE HYPO 25X1 1.5 SAFETY (NEEDLE) ×2 IMPLANT
NS IRRIG 500ML POUR BTL (IV SOLUTION) ×2 IMPLANT
PACK PERINEAL COLD (PAD) ×2 IMPLANT
PACK VAGINAL WOMENS (CUSTOM PROCEDURE TRAY) ×2 IMPLANT
PAD DRESSING TELFA 3X8 NADH (GAUZE/BANDAGES/DRESSINGS) IMPLANT
PAD PREP 24X48 CUFFED NSTRL (MISCELLANEOUS) ×2 IMPLANT
PENCIL BUTTON HOLSTER BLD 10FT (ELECTRODE) ×2 IMPLANT
PUNCH BIOPSY DERMAL 3 (INSTRUMENTS) IMPLANT
PUNCH BIOPSY DERMAL 3MM (INSTRUMENTS)
PUNCH BIOPSY DERMAL 4MM (INSTRUMENTS) IMPLANT
SUT VIC AB 0 CT1 36 (SUTURE) IMPLANT
SUT VIC AB 0 SH 27 (SUTURE) ×2 IMPLANT
SUT VIC AB 2-0 CT2 27 (SUTURE) IMPLANT
SUT VIC AB 2-0 SH 27 (SUTURE)
SUT VIC AB 2-0 SH 27XBRD (SUTURE) IMPLANT
SUT VIC AB 3-0 PS2 18 (SUTURE)
SUT VIC AB 3-0 PS2 18XBRD (SUTURE) IMPLANT
SUT VIC AB 3-0 SH 27 (SUTURE)
SUT VIC AB 3-0 SH 27X BRD (SUTURE) ×1 IMPLANT
SUT VIC AB 4-0 PS2 18 (SUTURE) ×2 IMPLANT
SUT VICRYL 0 UR6 27IN ABS (SUTURE) IMPLANT
SWAB OB GYN 8IN STERILE 2PK (MISCELLANEOUS) ×4 IMPLANT
SYR BULB IRRIG 60ML STRL (SYRINGE) ×2 IMPLANT
TOWEL OR 17X26 10 PK STRL BLUE (TOWEL DISPOSABLE) ×2 IMPLANT
TUBE CONNECTING 12X1/4 (SUCTIONS) ×1 IMPLANT
VACUUM HOSE 7/8X10 W/ WAND (MISCELLANEOUS) IMPLANT
WATER STERILE IRR 500ML POUR (IV SOLUTION) ×2 IMPLANT

## 2021-03-20 NOTE — Discharge Instructions (Addendum)
No acetaminophen/Tylenol until after 3:45pm today if needed for pain.      AFTER SURGERY INSTRUCTIONS   Return to work/gym:  2 weeks if applicable  Continue use of peri bottle after toileting and spray water at the same time as urinating to help with any burning. You can apply silvadene cream to the vulva after toileting as well.    Activity: 1. Be up and out of the bed during the day.  Take a nap if needed.  You may walk up steps but be careful and use the hand rail.  Stair climbing will tire you more than you think, you may need to stop part way and rest.    2. No lifting or straining for 2 weeks over 10 pounds. No pushing, pulling, straining for 2 weeks.   3. No driving for minimum 24 hours after surgery.  Do not drive if you are taking narcotic pain medicine and make sure that your reaction time has returned.    4. You can shower as soon as the next day after surgery. Shower daily. No tub baths or submerging your body in water until cleared by your surgeon.   5. No sexual activity and nothing in the vagina for 4 weeks.   6. You may experience vaginal spotting and discharge after surgery.  The spotting is normal but if you experience heavy bleeding, call our office.   7. Take Tylenol or ibuprofen first for pain and only use narcotic pain medication for severe pain not relieved by the Tylenol or Ibuprofen.  Monitor your Tylenol intake to a max of 4,000 mg in a 24 hour period. You can alternate these medications after surgery.   Diet: 1. Low sodium Heart Healthy Diet is recommended but you are cleared to resume your normal (before surgery) diet after your procedure.   2. It is safe to use a laxative, such as Miralax or Colace, if you have difficulty moving your bowels. You have been prescribed Sennakot at bedtime every evening to keep bowel movements regular and to prevent constipation.     Wound Care: 1. Keep clean and dry.  Shower daily.   Reasons to call the Doctor: Fever -  Oral temperature greater than 100.4 degrees Fahrenheit Foul-smelling vaginal discharge Difficulty urinating Nausea and vomiting Increased pain at the site of the incision that is unrelieved with pain medicine. Difficulty breathing with or without chest pain New calf pain especially if only on one side Sudden, continuing increased vaginal bleeding with or without clots.   Contacts: For questions or concerns you should contact:   Dr. Jeral Pinch at [phone]   Joylene John, NP at [phone]   After Hours: call [phone] and have the GYN Oncologist paged/contacted (after 5 pm or on the weekends).     Post Anesthesia Home Care Instructions  Activity: Get plenty of rest for the remainder of the day. A responsible individual must stay with you for 24 hours following the procedure.  For the next 24 hours, DO NOT: -Drive a car -Paediatric nurse -Drink alcoholic beverages -Take any medication unless instructed by your physician -Make any legal decisions or sign important papers.  Meals: Start with liquid foods such as gelatin or soup. Progress to regular foods as tolerated. Avoid greasy, spicy, heavy foods. If nausea and/or vomiting occur, drink only clear liquids until the nausea and/or vomiting subsides. Call your physician if vomiting continues.  Special Instructions/Symptoms: Your throat may feel dry or sore from the anesthesia or the breathing tube  placed in your throat during surgery. If this causes discomfort, gargle with warm salt water. The discomfort should disappear within 24 hours.       Messages sent via mychart are for non-urgent matters and are not responded to after hours so for urgent needs, please call the after hours number.

## 2021-03-20 NOTE — Anesthesia Procedure Notes (Signed)
Procedure Name: LMA Insertion Date/Time: 03/20/2021 11:31 AM Performed by: Lieutenant Diego, CRNA Pre-anesthesia Checklist: Patient identified, Emergency Drugs available, Suction available and Patient being monitored Patient Re-evaluated:Patient Re-evaluated prior to induction Oxygen Delivery Method: Circle system utilized Preoxygenation: Pre-oxygenation with 100% oxygen Induction Type: IV induction Ventilation: Mask ventilation without difficulty LMA: LMA inserted LMA Size: 4.0 Number of attempts: 1 Placement Confirmation: positive ETCO2 and breath sounds checked- equal and bilateral Tube secured with: Tape Dental Injury: Teeth and Oropharynx as per pre-operative assessment

## 2021-03-20 NOTE — Transfer of Care (Signed)
Immediate Anesthesia Transfer of Care Note  [name]  Procedure(s) Performed: Procedure(s) (LRB): vaginal excision (N/A) CO2 LASER APPLICATION to vagina and vuvla (N/A)  Patient Location: PACU  Anesthesia Type: General  Level of Consciousness: awake, oriented, sedated and patient cooperative  Airway & Oxygen Therapy: Patient Spontanous Breathing and Patient connected to face mask oxygen  Post-op Assessment: Report given to PACU RN and Post -op Vital signs reviewed and stable  Post vital signs: Reviewed and stable  Complications: No apparent anesthesia complications Last Vitals:  Vitals Value Taken Time  BP 133/61 03/20/21 1225  Temp    Pulse 54 03/20/21 1227  Resp 10 03/20/21 1227  SpO2 100 % 03/20/21 1227  Vitals shown include unvalidated device data.  Last Pain:  Vitals:   03/20/21 0946  TempSrc: Oral  PainSc: 0-No pain      Patients Stated Pain Goal: 4 (72/09/47 0962)  Complications: No notable events documented.

## 2021-03-20 NOTE — Anesthesia Postprocedure Evaluation (Signed)
Anesthesia Post Note  [name]  Procedure(s) Performed: vaginal excision (Vagina ) CO2 LASER APPLICATION to vagina and vuvla (Vagina )     Patient location during evaluation: PACU Anesthesia Type: General Level of consciousness: awake and alert Pain management: pain level controlled Vital Signs Assessment: post-procedure vital signs reviewed and stable Respiratory status: spontaneous breathing, nonlabored ventilation, respiratory function stable and patient connected to nasal cannula oxygen Cardiovascular status: blood pressure returned to baseline, stable and bradycardic Postop Assessment: no apparent nausea or vomiting Anesthetic complications: no   No notable events documented.  Last Vitals:  Vitals:   03/20/21 1359 03/20/21 1401  BP: (!) 180/65 (!) 168/72  Pulse: (!) 57 (!) 53  Resp: 15   Temp: 36.9 C   SpO2: 96% 96%    Last Pain:  Vitals:   03/20/21 1359  TempSrc:   PainSc: [place] Danylah Holden

## 2021-03-20 NOTE — Op Note (Signed)
[name] DATE: 03/20/21  Preop Diagnosis: Recurrent vaginal cancer, high-grade vulvar dysplasia  Postoperative Diagnosis: same as above  Surgery: Posterior vaginectomy, CO2 laser of the vulva  Surgeons:  Valarie Cones, MD Assistant: Joylene John NP  Anesthesia: MAC   Estimated blood loss: 20 ml  IVF:  see I&O flowsheet   Urine output: n/a   Complications: None apparent  Pathology: Distal posterior vaginal excision from 4-8 o'clock (up to the introitus), second superior excision almost to the apex along posterior vaginal wall from 4-8 o'clock   Operative findings: Small area of beefy red tissue along posterior wall wall at 6 o'clock. Otherwise, vaginal mucosa atrophic with radiation changes. Vulva was changes consistent with history of lichen sclerosis, healing biopsy sites noted.  Procedure: The patient was identified in the preoperative holding area. Informed consent was signed on the chart. Patient was seen history was reviewed and exam was performed.   The patient was then taken to the operating room and placed in the supine position with SCD hose on. General anesthesia was then induced without difficulty. She was then placed in the dorsolithotomy position. The perineum was prepped with Betadine. The vagina was prepped with Betadine. The patient was then draped after the prep was dried.   Timeout was performed the patient, procedure, antibiotic, allergy, and length of procedure. The vagina was inspected with findings noted above and monopolar electrocautery was used to outlined planned area for resection. The subcuticular tissues were infiltrated with 1% lidocaine. A scalpel was used to incise the epithelium and Allis clamps were used to grasp the epithelium to manipulate the tissue, taking care not to excise too deeply. Once the distal portion of the posterior vagina had been excised, the proximal posterior aspect was excised in the same manner. Monopolar  electrocautery was used to achieve hemostasis. Multiple rectal exams were performed during the excision to assure anus and rectum intact and unharmed (gloves were changed after each exam).  The patient's surgical field was draped with wet towels. 1% lidocaine was injected in the subcuticular tissue of the vulva.The staff and patient ensured laser-safe eyewear and masks were fitted. The laser was set to 8 watts continuous. The laser was tested for accuracy on a tongue depressor.  The laser was applied to the circumscribed area of the vulva that had been previously identified. The tissue was ablated to the desired depth and the eschar was removed with a moistened sponge. When the entire lesion had been ablated the procedure was complete.  Silvadine cream was applied to the laser site and to the vaginal mucosa..  All instrument, suture, laparotomy, Ray-Tec, and needle counts were correct x2. The patient tolerated the procedure well and was taken recovery room in stable condition.   Jeral Pinch MD Gynecologic Oncology

## 2021-03-20 NOTE — Interval H&P Note (Signed)
History and Physical Interval Note:  03/20/2021 11:12 AM  [name] Richards  has presented today for surgery, with the diagnosis of recurrent vaginal cancer.  The various methods of treatment have been discussed with the patient and family. After consideration of risks, benefits and other options for treatment, the patient has consented to  Procedure(s): VAGINECTOMY,PARTIAL with biopsies (N/A) CO2 LASER APPLICATION to vagina and vuvla (N/A) as a surgical intervention.  The patient's history has been reviewed, patient examined, no change in status, stable for surgery.  I have reviewed the patient's chart and labs.  Questions were answered to the patient's satisfaction.     [name]

## 2021-03-21 ENCOUNTER — Inpatient Hospital Stay: Payer: Medicare Other | Attending: Gynecologic Oncology | Admitting: Gynecologic Oncology

## 2021-03-21 ENCOUNTER — Telehealth: Payer: Self-pay

## 2021-03-21 ENCOUNTER — Encounter (HOSPITAL_BASED_OUTPATIENT_CLINIC_OR_DEPARTMENT_OTHER): Payer: Self-pay | Admitting: Gynecologic Oncology

## 2021-03-21 ENCOUNTER — Encounter: Payer: Self-pay | Admitting: Oncology

## 2021-03-21 DIAGNOSIS — C52 Malignant neoplasm of vagina: Secondary | ICD-10-CM

## 2021-03-21 DIAGNOSIS — Z7189 Other specified counseling: Secondary | ICD-10-CM

## 2021-03-21 DIAGNOSIS — D071 Carcinoma in situ of vulva: Secondary | ICD-10-CM

## 2021-03-21 DIAGNOSIS — Z9079 Acquired absence of other genital organ(s): Secondary | ICD-10-CM

## 2021-03-21 LAB — SURGICAL PATHOLOGY

## 2021-03-21 NOTE — Telephone Encounter (Signed)
Spoke with [name] Richards this morning. She states she is eating, drinking and urinating well. She has not had a BM yet but is passing gas. She is taking a stool softener and miralax and encouraged her to drink plenty of water. She denies fever or chills. She reports small amount of vaginal spotting which is expected after the procedure.  She rates her pain 0/10. She has not needed to take any pain medication today but does have tylenol, ibuprofen and tramadol available.   Instructed to call office with any fever, chills, purulent drainage, uncontrolled pain or any other questions or concerns. Patient verbalizes understanding.   Pt aware of post op appointments as well as the office number [phone] and after hours number [phone] to call if she has any questions or concerns

## 2021-03-21 NOTE — Progress Notes (Signed)
Gynecologic Oncology Telehealth Consult Note: Gyn-Onc  I connected with [name] Richards on 03/21/21 at  4:00 PM EST by telephone and verified that I am speaking with the correct person using two identifiers.  I discussed the limitations, risks, security and privacy concerns of performing an evaluation and management service by telemedicine and the availability of in-person appointments. I also discussed with the patient that there may be a patient responsible charge related to this service. The patient expressed understanding and agreed to proceed.  Other persons participating in the visit and their role in the encounter: None.  Patient's location: Home Provider's location: [facility]  Reason for Visit: Follow-up after recent surgery, treatment discussion  Treatment History: Oncology History Overview Note  Patient followed due to HX pulmonary nodules noted 2011  Primary cancer of left upper lobe of lung ([place])   Staging form: Lung, AJCC 7th Edition   - Clinical stage from 09/27/2015: Stage IA (T1b, N0, M0) - Signed by Curt Bears, MD on 09/27/2015    Primary cancer of left upper lobe of lung ([place])  07/12/2015 Imaging   CT Chest IMPRESSION:  The solid component of the large part solid nodule in the left upper lobe measures 9 x 3 mm (mean diameter 6 mm.) persistent part solid nodules with solid components greater than or equal to 6 mm should be considered highly suspicious for pulmonary adenocarcinoma   08/01/2015 Initial Diagnosis   Primary cancer of left upper lobe of lung ([place])   08/01/2015 Surgery   PROCEDURE:  Left video-assisted thoracoscopy,           Wedge resection,           Thoracoscopic lingular sparing left upper lobectomy           Lymph node dissection           On-Q local anesthetic catheter placement   08/01/2015 Pathology Results   Lung, resection (segmental or lobe), Left Upper Lobe - ADENOCARCINOMA, WELL DIFFERENTIATED, SPANNING 2.5 CM. - THE SURGICAL  RESECTION MARGINS ARE NEGATIVE FOR CARCINOMA. - THERE IS NO EVIDENCE OF CARCINOMA IN 1 OF 1 LYMPH NODE (     Interval History: Patient reports doing very well after the procedure, minimal pain.  Using the Silvadene cream.  Has not had a bowel movement.  Voiding without difficulty.  Denies any discharge or bleeding.  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 S/p TAH and radiation x6 wks    Complication of anesthesia    took 2 days to wake up after 1 surgery at 20, pt can't use a adult cath,needs peds. cath due to past injury   Constipation    very worried about this   Endometrial cancer [facility]) 1997   coincidental endometrial cancer also   History of radiation therapy 10/12/2019   [facility] HDR brachytherapy  09/29/2019-10/12/2019  Dr Gery Pray   Hyperlipemia    Hypertension    Hypothyroidism    Primary cancer of left upper lobe of lung ([place]) 08/01/2015   Small bowel obstruction ([place])    12/2008, repeat SBO in 02/2011 also with terminal ilietis    Thyroid disease    Vertebral body hemangioma    T12    Past Surgical History:  Procedure Laterality Date   ABDOMINAL HYSTERECTOMY  Feb 1997   TAH/BSO,  pelvic and periaortic lymphadenectomy    BACK SURGERY     CO2 LASER APPLICATION N/A 02/15/1094   Procedure: CO2 LASER APPLICATION to vagina and vuvla;  Surgeon: [name], MD;  Location: [facility];  Service: Gynecology;  Laterality: N/A;   EYE SURGERY     Lasik    LYMPH NODE DISSECTION Left 08/01/2015   Procedure: LYMPH NODE DISSECTION;  Surgeon: Melrose Nakayama, MD;  Location: [place];  Service: Thoracic;  Laterality: Left;   PARTIAL NEPHRECTOMY     right   SEGMENTECOMY Left 08/01/2015   Procedure: Left Upper Lobe SEGMENTECTOMY;  Surgeon: Melrose Nakayama, MD;  Location: [place];  Service:  Thoracic;  Laterality: Left;   SKIN BIOPSY     TUBAL LIGATION     TUMOR REMOVAL     meningioma  T 12   VAGINECTOMY, PARTIAL N/A 08/11/2019   Procedure: TOTAL VAGINECTOMY;  Surgeon: Everitt Amber, MD;  Location: [facility];  Service: Gynecology;  Laterality: N/A;   VAGINECTOMY, PARTIAL N/A 03/20/2021   Procedure: vaginal excision;  Surgeon: [name], MD;  Location[facility];  Service: Gynecology;  Laterality: N/A;   VIDEO ASSISTED THORACOSCOPY (VATS)/WEDGE RESECTION Left 08/01/2015   Procedure: VIDEO ASSISTED THORACOSCOPY (VATS)/WEDGE RESECTION;  Surgeon: Melrose Nakayama, MD;  Location: [place];  Service: Thoracic;  Laterality: Left;   VULVA Milagros Loll BIOPSY N/A 02/14/2021   Procedure: VAGINAL AND VULVAR BIOPSY;  Surgeon: [name], MD;  Location: [facility];  Service: Gynecology;  Laterality: N/A;   VULVA SURGERY     x 2, 1 at [facility] (2016), and [facility]     Family History  Problem Relation Age of Onset   Hypertension Other    Diabetes Other    Stroke Other    CAD Other     Social History   Socioeconomic History   Marital status: Divorced    Spouse name: Not on file   Number of children: Not on file   Years of education: Not on file   Highest education level: Not on file  Occupational History   Not on file  Tobacco Use   Smoking status: Never   Smokeless tobacco: Never  Vaping Use   Vaping Use: Never used  Substance and Sexual Activity   Alcohol use: No   Drug use: No   Sexual activity: Never  Other Topics Concern   Not on file  Social History Narrative   Lives at home alone, works out at [employer] 6 days a week, still very active. Has an ex-husband who she is in contact with. Son died of an MI and another son in [place].    Social Determinants of Health   Financial Resource Strain: Not on file  Food Insecurity: Not on file  Transportation Needs: Not on file  Physical Activity: Not on file  Stress: Not on  file  Social Connections: Not on file    Current Medications:  Current Outpatient Medications:    acyclovir (ZOVIRAX) 800 MG tablet, 1 tablet, Disp: , Rfl:    amLODipine (NORVASC) 5 MG tablet, SMARTSIG:2 Tablet(s) By Mouth Every Evening, Disp: , Rfl:    Ascorbic Acid (VITAMIN C PO), Vitamin C, Disp: , Rfl:    benazepril-hydrochlorthiazide (LOTENSIN HCT) 20-25 MG tablet, Take 1 tablet by mouth daily. , Disp: , Rfl:    BIOTIN PO, Take 1 tablet by mouth 4 (four) times a week., Disp: , Rfl:  Cholecalciferol (VITAMIN D3 PO), Take 1 tablet by mouth in the morning and at bedtime., Disp: , Rfl:    docusate sodium (COLACE) 100 MG capsule, Take 100 mg by mouth 2 (two) times daily., Disp: , Rfl:    levothyroxine (SYNTHROID, LEVOTHROID) 75 MCG tablet, Take 75 mcg by mouth daily., Disp: , Rfl:    MAGNESIUM PO, Take 1 tablet by mouth daily., Disp: , Rfl:    Multiple Vitamin (MULTIVITAMIN WITH MINERALS) TABS tablet, Take 1 tablet by mouth daily., Disp: , Rfl:    Omega-3 Fatty Acids (FISH OIL PO), Take 1 capsule by mouth daily., Disp: , Rfl:    polyethylene glycol (MIRALAX / GLYCOLAX) packet, Take 11.3333 g by mouth at bedtime. 2/3 capful, Disp: , Rfl:    senna-docusate (SENOKOT-S) 8.6-50 MG tablet, Take 2 tablets by mouth at bedtime. For AFTER surgery, do not take if having diarrhea (Patient not taking: Reported on 03/20/2021), Disp: 30 tablet, Rfl: 0   simvastatin (ZOCOR) 20 MG tablet, Take 20 mg by mouth at bedtime. , Disp: , Rfl:    traMADol (ULTRAM) 50 MG tablet, Take 1 tablet (50 mg total) by mouth every 6 (six) hours as needed for severe pain. For AFTER surgery only, do not take and drive (Patient not taking: Reported on 03/20/2021), Disp: 10 tablet, Rfl: 0  Review of Symptoms: Pertinent positives as per HPI.  Physical Exam: There were no vitals taken for this visit. Deferred given limitations of phone visit.  Laboratory & Radiologic Studies: A. VAGINA, EXTERNAL, 5-7 OCLOCK, BIOPSY:  -  Squamous  mucosa with ulceration and inflammation.  -  No residual malignancy or squamous dysplasia identified.  -  Few atypical "radiation" fibroblasts embedded within reaction.   B. VAGINA, SUPERIOR MARGIN, EXCISION:  -  Ulcerated tissue with acutely inflamed granulation tissue.  -  No malignancy identified.  -  Rare atypical "radiation" fibroblast.   Assessment & Plan: [name] Richards is a 76 y.o. woman with recurrent vaginal cancer as well as high-grade vulvar dysplasia status post partial posterior vaginectomy and laser ablation of the vulva yesterday.  Patient is overall doing very well and meeting postoperative milestones.  Discussed continued expectations and restrictions.  Reviewed pathology with her which shows no dysplasia or malignancy in the resection from yesterday.  We will ask pathology to clarify the size and depth of invasion from the biopsy recently that showed recurrent cancer.  We will also clarify whether the margins were positive.  My suspicion, based on repeat resection being negative and size of small biopsy showing cancer, is that the patient does not need adjuvant therapy and can be monitored closely.  She and I have previously discussed retrial of clobetasol to help with symptom control and prevention of lichen sclerosis associated recurrent cancer.  I discussed the assessment and treatment plan with the patient. The patient was provided with an opportunity to ask questions and all were answered. The patient agreed with the plan and demonstrated an understanding of the instructions.   The patient was advised to call back or see an in-person evaluation if the symptoms worsen or if the condition fails to improve as anticipated.   24 minutes of total time was spent for this patient encounter, including preparation, face-to-face counseling with the patient and coordination of care, and documentation of the encounter.   Jeral Pinch, MD  Division of Gynecologic Oncology   Department of Obstetrics and Gynecology  [facility]

## 2021-03-21 NOTE — Progress Notes (Signed)
Email sent to [facility] Pathology to clarify size of the cancer and if margins were positive on accession [id number].

## 2021-03-27 ENCOUNTER — Telehealth: Payer: Medicare Other | Admitting: Gynecologic Oncology

## 2021-03-28 ENCOUNTER — Encounter: Payer: Self-pay | Admitting: Gynecologic Oncology

## 2021-03-29 ENCOUNTER — Ambulatory Visit: Payer: Medicare Other | Admitting: Gynecologic Oncology

## 2021-04-03 LAB — SURGICAL PATHOLOGY

## 2021-04-11 ENCOUNTER — Telehealth: Payer: Medicare Other | Admitting: Gynecologic Oncology

## 2021-04-11 DIAGNOSIS — M25551 Pain in right hip: Secondary | ICD-10-CM | POA: Diagnosis not present

## 2021-04-12 ENCOUNTER — Encounter: Payer: Self-pay | Admitting: Gynecologic Oncology

## 2021-04-12 ENCOUNTER — Ambulatory Visit
Admission: RE | Admit: 2021-04-12 | Discharge: 2021-04-12 | Disposition: A | Discharge status: Home / Self Care | Payer: Medicare Other | Source: Ambulatory Visit | Attending: Family Medicine | Admitting: Family Medicine

## 2021-04-12 ENCOUNTER — Other Ambulatory Visit: Payer: Self-pay | Admitting: Family Medicine

## 2021-04-12 DIAGNOSIS — M25551 Pain in right hip: Secondary | ICD-10-CM

## 2021-04-15 ENCOUNTER — Encounter: Payer: Self-pay | Admitting: Gynecologic Oncology

## 2021-04-15 ENCOUNTER — Inpatient Hospital Stay: Payer: Medicare Other | Attending: Gynecologic Oncology | Admitting: Gynecologic Oncology

## 2021-04-15 ENCOUNTER — Other Ambulatory Visit: Payer: Self-pay

## 2021-04-15 VITALS — BP 160/68 | HR 59 | Temp 98.2°F | Resp 16 | Ht 65.0 in | Wt 158.2 lb

## 2021-04-15 DIAGNOSIS — C52 Malignant neoplasm of vagina: Secondary | ICD-10-CM

## 2021-04-15 DIAGNOSIS — L900 Lichen sclerosus et atrophicus: Secondary | ICD-10-CM

## 2021-04-15 DIAGNOSIS — Z9079 Acquired absence of other genital organ(s): Secondary | ICD-10-CM

## 2021-04-15 DIAGNOSIS — D071 Carcinoma in situ of vulva: Secondary | ICD-10-CM

## 2021-04-15 MED ORDER — CLOBETASOL PROPIONATE 0.05 % EX CREA: TOPICAL_CREAM | CUTANEOUS | 4 refills | Status: DC

## 2021-04-15 MED ORDER — ESTRADIOL 0.1 MG/GM VA CREA: TOPICAL_CREAM | VAGINAL | 6 refills | Status: DC

## 2021-04-15 NOTE — Progress Notes (Signed)
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[place])   Staging form: Lung, AJCC 7th Edition   - Clinical stage from 09/27/2015: Stage IA (T1b, N0, M0) - Signed by Curt Bears, MD on 09/27/2015    Primary cancer of left upper lobe of lung ([place])  07/12/2015 Imaging   CT Chest IMPRESSION:  The solid component of the large part solid nodule in the left upper lobe measures 9 x 3 mm (mean diameter 6 mm.) persistent part solid nodules with solid components greater than or equal to 6 mm should be considered highly suspicious for pulmonary adenocarcinoma   08/01/2015 Initial Diagnosis   Primary cancer of left upper lobe of lung ([place])   08/01/2015 Surgery   PROCEDURE:  Left video-assisted thoracoscopy,           Wedge resection,           Thoracoscopic lingular sparing left upper lobectomy           Lymph node dissection           On-Q local anesthetic catheter placement   08/01/2015 Pathology Results   Lung, resection (segmental or lobe), Left Upper Lobe - ADENOCARCINOMA, WELL DIFFERENTIATED, SPANNING 2.5 CM. - THE SURGICAL RESECTION MARGINS ARE NEGATIVE FOR CARCINOMA. 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 She underwent a total abdominal hysterectomy and bilateral salpingo-oophorectomy, pelvic and para-aortic lymphadenectomy. She received postoperative whole pelvis radiation therapy.  Her other primary gynecologic problem has been lichen sclerosus managed with when necessary clobetasol.   Beginning in 2013 the patient had intermittent episodes of partial small bowel obstruction most likely secondary to radiation  injury to the terminal ileum.   In December 2016 the patient was found to have a new primary squamous cell carcinoma the vulva undergoing a modified radical vulvectomy on 02/06/2015. She underwent a modified radical vulvectomy at [facility] on 02/06/2015. She was found to have a 3 cm vulvar lesion of the posterior vulva extending into the posterior vagina. A rhomboid flap was required to close the incision.  Final pathology showed some close surgical margins. However, the specimen margins were toward the time of surgery and it was Dr Mora Bellman impression that they were widely around the primary cancer. She discussed options with the patient and they agreed to monitor her closely but not recommend any radiation therapy to the vulva.   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  Depth of invasion was 4 mm all margins were negative.  Follow-up PET scan March 23, 2017 showed no evidence of metastatic disease.   Examination of the vulva during routine surveillance on 07/20/19 showed a friable, verrucous lesion measuring approximately 2 cm was seen on the anterior wall of the suburethral vagina extending to the introitus and wrapping around to the right mid vagina. This was concerning for recurrence and was biopsied at that appointment. 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    On 08/11/19 she underwent total vaginectomy. Intraoperative findings were significant for a forshortened 3cm vagina, friable 4cm plaque on the anterior vagina extending to  the right fornix and left posterior vaginal wall. This necessitated a total vaginectomy to resect the lesion grossly. Her tissues were extremely friable due to age, prior radiation, menopause and lichen sclerosis. There was unavoidable fragmentation of the tissues during the resection. All gross visible disease was removed. Surgery was uncomplicated, and she reported no  postop pain.  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 The suspicious foci are present at the edges of tissue fragments.  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    Post-op PET scan in August, 2021 showed no metastatic disease.   The patient received adjuvant vaginal brachytherapy between 09/29/2019 through 10/12/2019.  She tolerated treatment very well with no toxicities.   I saw the patient on 12/28.  At the time of her visit, I was concerned for dysplastic changes versus radiation treatment within the vagina and on her vulva.   On 02/14/2021, the patient underwent exam under anesthesia, vaginal and vulvar biopsies.  Findings were notable for anatomic changes related to prior surgical treatment and radiation.  Significant atrophy was noted.  Multiple spots of hyperkeratosis versus acetowhite after application of acetic acid were found, all biopsied.  Final pathology revealed invasive squamous cell carcinoma at 5:00 of the vaginal biopsy.  Multiple other biopsies including a 1:00 periclitoral biopsy and apex of the vagina showed low-grade squamous intraepithelial lesion.  Biopsy from 7:00 within the vagina showed high-grade squamous intraepithelial lesion. Submitted biopsies are noted to be very small, superficial, and oriented.  Most of them have dysplasia or carcinoma extended to the lateral edges but without involvement of the base of the biopsy.  The one biopsy showing carcinoma is 1 mm thick and described as extending to the edge of the biopsy where the lesion is transected and therefore measurement likely does not accurately represent the final thickness.   PET scan was performed on 02/28/2021.  There was noted to be interval decrease in degree of FDG uptake  within the vaginal introitus compared to PET scan in 2021 after her last surgery.  No concerning findings for metastatic disease.  Comment was noted that there is mild uptake within a left inguinal lymph node but the morphology is benign.  Imaging was reviewed at our tumor conference on 1/23 with discussion being findings did not represent metastatic disease.  03/20/21: Vaginectomy, CO2 laser of the vulva for recurrent vaginal cancer, high-grade vulvar dysplasia.  Vaginal excision sowed squamous mucosa with ulceration and inflammation.  No malignancy or dysplasia identified.  Rare atypical "radiation" fibroblast embedded.  Interval History: The patient presents today for follow-up.  She notes overall doing well.  She is having minimal vaginal spotting.  Denies any discharge, vaginal or vulvar pain, pruritus, or other symptoms.  Endorses regular bowel and bladder function.  Is back to exercising.  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 S/p TAH and radiation x6 wks    Complication of anesthesia    took 2 days to wake up after 1 surgery at 20, pt can't use a adult cath,needs peds. cath due to past injury   Constipation    very worried about this   Endometrial cancer ([place])  1997   coincidental endometrial cancer also   History of radiation therapy 10/12/2019   [facility] HDR brachytherapy  09/29/2019-10/12/2019  Dr Gery Pray   Hyperlipemia    Hypertension    Hypothyroidism    Primary cancer of left upper lobe of lung ([place]) 08/01/2015   Small bowel obstruction ([place])    12/2008, repeat SBO in 02/2011 also with terminal ilietis    Thyroid disease    Vertebral body hemangioma    T12    Past Surgical History:  Procedure Laterality Date   ABDOMINAL HYSTERECTOMY  Feb 1997   TAH/BSO, pelvic and periaortic lymphadenectomy    BACK  SURGERY     CO2 LASER APPLICATION N/A 05/19/1789   Procedure: CO2 LASER APPLICATION to vagina and vuvla;  Surgeon: [name], MD;  Location: [facility];  Service: Gynecology;  Laterality: N/A;   EYE SURGERY     Lasik    LYMPH NODE DISSECTION Left 08/01/2015   Procedure: LYMPH NODE DISSECTION;  Surgeon: Melrose Nakayama, MD;  Location: [place];  Service: Thoracic;  Laterality: Left;   PARTIAL NEPHRECTOMY     right   SEGMENTECOMY Left 08/01/2015   Procedure: Left Upper Lobe SEGMENTECTOMY;  Surgeon: Melrose Nakayama, MD;  Location: [place];  Service: Thoracic;  Laterality: Left;   SKIN BIOPSY     TUBAL LIGATION     TUMOR REMOVAL     meningioma  T 12   VAGINECTOMY, PARTIAL N/A 08/11/2019   Procedure: TOTAL VAGINECTOMY;  Surgeon: Everitt Amber, MD;  Location: [facility];  Service: Gynecology;  Laterality: N/A;   VAGINECTOMY, PARTIAL N/A 03/20/2021   Procedure: vaginal excision;  Surgeon: [name], MD;  Location: [facility];  Service: Gynecology;  Laterality: N/A;   VIDEO ASSISTED THORACOSCOPY (VATS)/WEDGE RESECTION Left 08/01/2015   Procedure: VIDEO ASSISTED THORACOSCOPY (VATS)/WEDGE RESECTION;  Surgeon: Melrose Nakayama, MD;  Location: [place];  Service: Thoracic;  Laterality: Left;   VULVA Milagros Loll BIOPSY N/A 02/14/2021   Procedure: VAGINAL AND VULVAR BIOPSY;  Surgeon: [name], MD;  Location: [facility];  Service: Gynecology;  Laterality: N/A;   VULVA SURGERY     x 2, 1 at [facility] (2016), and [facility]     Family History  Problem Relation Age of Onset   Hypertension Other    Diabetes Other    Stroke Other    CAD Other     Social History   Socioeconomic History   Marital status: Divorced    Spouse name: Not on file   Number of children: Not on file   Years of education: Not on file   Highest education level: Not on file  Occupational History   Not on file  Tobacco Use   Smoking status: Never    Smokeless tobacco: Never  Vaping Use   Vaping Use: Never used  Substance and Sexual Activity   Alcohol use: No   Drug use: No   Sexual activity: Never  Other Topics Concern   Not on file  Social History Narrative   Lives at home alone, works out at [employer] 6 days a week, still very active. Has an ex-husband who she is in contact with. Son died of an MI and another son in [place].    Social Determinants of Health   Financial Resource Strain: Not on file  Food Insecurity: Not on file  Transportation Needs: Not on file  Physical Activity: Not on file  Stress: Not on file  Social Connections: Not on file    Current Medications:  Current Outpatient Medications:    acyclovir (ZOVIRAX) 800 MG tablet, 1 tablet, Disp: , Rfl:    amLODipine (NORVASC) 5 MG tablet, SMARTSIG:2 Tablet(s) By Mouth Every Evening, Disp: , Rfl:    Ascorbic Acid (VITAMIN C PO), Vitamin C, Disp: , Rfl:    benazepril-hydrochlorthiazide (LOTENSIN HCT) 20-25 MG tablet, Take 1 tablet by mouth daily. , Disp: , Rfl:    BIOTIN PO, Take 1 tablet by mouth 4 (four) times a week., Disp: , Rfl:    Cholecalciferol (VITAMIN D3 PO), Take 1 tablet by mouth in the morning and at bedtime., Disp: , Rfl:    [START ON 05/27/2021] clobetasol cream (TEMOVATE) 0.05 %, Use fingertip size amount of cream on your finger and apply slightly past the opening of the vagina three times a week at night, Disp: 30 g, Rfl: 4   docusate sodium (COLACE) 100 MG capsule, Take 100 mg by mouth 2 (two) times daily., Disp: , Rfl:    estradiol (ESTRACE VAGINAL) 0.1 MG/GM vaginal cream, Use fingertip size amount of cream on your finger and apply slightly past the opening of the vagina three times a week at night, Disp: 42.5 g, Rfl: 6   levothyroxine (SYNTHROID, LEVOTHROID) 75 MCG tablet, Take 75 mcg by mouth daily., Disp: , Rfl:    MAGNESIUM PO, Take 1 tablet by mouth daily., Disp: , Rfl:    Multiple Vitamin (MULTIVITAMIN WITH MINERALS) TABS tablet, Take 1  tablet by mouth daily., Disp: , Rfl:    Omega-3 Fatty Acids (FISH OIL PO), Take 1 capsule by mouth daily., Disp: , Rfl:    polyethylene glycol (MIRALAX / GLYCOLAX) packet, Take 11.3333 g by mouth at bedtime. 2/3 capful, Disp: , Rfl:    senna-docusate (SENOKOT-S) 8.6-50 MG tablet, Take 2 tablets by mouth at bedtime. For AFTER surgery, do not take if having diarrhea, Disp: 30 tablet, Rfl: 0   simvastatin (ZOCOR) 20 MG tablet, Take 20 mg by mouth at bedtime. , Disp: , Rfl:   Review of Systems: Denies appetite changes, fevers, chills, fatigue, unexplained weight changes. Denies hearing loss, neck lumps or masses, mouth sores, ringing in ears or voice changes. Denies cough or wheezing.  Denies shortness of breath. Denies chest pain or palpitations. Denies leg swelling. Denies abdominal distention, pain, blood in stools, constipation, diarrhea, nausea, vomiting, or early satiety. Denies pain with intercourse, dysuria, frequency, hematuria or incontinence. Denies hot flashes, pelvic pain, or vaginal discharge.   Denies joint pain, back pain or muscle pain/cramps. Denies itching, rash, or wounds. Denies dizziness, headaches, numbness or seizures. Denies swollen lymph nodes or glands, denies easy bruising or bleeding. Denies anxiety, depression, confusion, or decreased concentration.  Physical Exam: BP (!) 160/68 (BP Location: Right Arm, Patient Position: Sitting)    Pulse (!) 59    Temp 98.2 F (36.8 C) (Oral)    Resp 16    Ht 5\' 5"  (1.651 m)    Wt 158 lb 3.2 oz (71.8 kg)    SpO2 100%    BMI 26.33 kg/m  General: Alert, oriented, no acute distress. HEENT: Normocephalic, atraumatic, sclera anicteric. Chest: Unlabored breathing on room air. Extremities: Grossly normal range of motion.  Warm, well perfused.  No edema bilaterally. Skin: No rashes or lesions noted. Lymphatics: No cervical, supraclavicular, or inguinal adenopathy. GU: Mild erythema of vulvar areas treated with laser.  Overall tissue  is healing well.  No exudate, discharge, or  bleeding noted.  Healing wound bed along posterior vagina.  Laboratory & Radiologic Studies: None new  Assessment & Plan: [name] Richards is a 76 y.o. woman with recurrent vaginal cancer as well as high-grade vulvar dysplasia status post partial posterior vaginectomy and laser ablation of the vulva on 03/20/21.  The patient is healing well from surgery.  She is cleared from all restrictions at this point.  Discussed continued expectations.  Reviewed in detail with her pathology from surgery.  She was given a paper copy.  Although at least one of the margins from the biopsy showing cancer was positive, everything that I resected from the posterior vagina was negative for any cancer at the time of her most recent surgery.  Given good negative margins and otherwise biopsies only showing precancer (all high-grade dysplasia of the vulva were treated with laser therapy), I do not think that additional adjuvant treatment is indicated.  We have previously discussed treatment plan during surveillance to help decrease any symptoms related to her lichen sclerosis.  I have recommended that we use clobetasol intermittently.  We will plan to have her alternate between vaginal estrogen and clobetasol.  I have asked her to start the estrogen in 2 weeks and to use this 3 times a week at night for a month.  She will then use the clobetasol 2-3 times a week at night for a month.  I will see her back in 3 months for surveillance visit.  We discussed symptoms that should prompt a phone call to see me sooner.  28 minutes of total time was spent for this patient encounter, including preparation, face-to-face counseling with the patient and coordination of care, and documentation of the encounter.  Jeral Pinch, MD  Division of Gynecologic Oncology  Department of Obstetrics and Gynecology  [facility]

## 2021-04-15 NOTE — Patient Instructions (Addendum)
Was good to see you today.  You are recovering very well from surgery.  We will plan on visits every 3 months for an exam.  If you develop any concerning symptoms, such as itching, bleeding, discharge, pain, please call to see me sooner. ? ?Please wait 2 weeks before starting the vaginal estrogen.  You will use this 3 times a week at night.  Do this for 1 month.  Then stop the vaginal estrogen and start the clobetasol or steroid cream.  You will use this 2 or 3 times a week at night as well.  You will alternate every other month this way. ?

## 2021-04-22 ENCOUNTER — Ambulatory Visit: Payer: Self-pay | Admitting: Radiation Oncology

## 2021-05-02 ENCOUNTER — Ambulatory Visit: Payer: Self-pay | Admitting: Radiation Oncology

## 2021-05-08 DIAGNOSIS — M545 Low back pain, unspecified: Secondary | ICD-10-CM | POA: Diagnosis not present

## 2021-05-08 DIAGNOSIS — M1712 Unilateral primary osteoarthritis, left knee: Secondary | ICD-10-CM | POA: Diagnosis not present

## 2021-05-16 ENCOUNTER — Encounter: Payer: Self-pay | Admitting: Gynecologic Oncology

## 2021-05-16 DIAGNOSIS — M85831 Other specified disorders of bone density and structure, right forearm: Secondary | ICD-10-CM | POA: Diagnosis not present

## 2021-05-16 DIAGNOSIS — Z1231 Encounter for screening mammogram for malignant neoplasm of breast: Secondary | ICD-10-CM | POA: Diagnosis not present

## 2021-06-03 ENCOUNTER — Encounter: Payer: Medicare Other | Admitting: Gynecologic Oncology

## 2021-07-02 DIAGNOSIS — H35371 Puckering of macula, right eye: Secondary | ICD-10-CM | POA: Diagnosis not present

## 2021-07-02 DIAGNOSIS — H40003 Preglaucoma, unspecified, bilateral: Secondary | ICD-10-CM | POA: Diagnosis not present

## 2021-07-12 ENCOUNTER — Encounter: Payer: Self-pay | Admitting: Gynecologic Oncology

## 2021-07-15 ENCOUNTER — Telehealth: Payer: Self-pay | Admitting: *Deleted

## 2021-07-15 ENCOUNTER — Encounter: Payer: Self-pay | Admitting: Gynecologic Oncology

## 2021-07-15 NOTE — Telephone Encounter (Signed)
Emailed the letter to [employer] and the patient

## 2021-07-15 NOTE — Telephone Encounter (Signed)
Spoke with the patient and received verbal consent to send [employer] her medical information. Patient requested a copy of the letter/form be sent to her also

## 2021-07-15 NOTE — Telephone Encounter (Signed)
Called and left the patient a message to call the office back. Need permission from the patient to provide her information to [employer].

## 2021-07-18 ENCOUNTER — Inpatient Hospital Stay: Payer: Medicare Other | Attending: Gynecologic Oncology | Admitting: Gynecologic Oncology

## 2021-07-18 ENCOUNTER — Other Ambulatory Visit: Payer: Self-pay

## 2021-07-18 ENCOUNTER — Encounter: Payer: Self-pay | Admitting: Gynecologic Oncology

## 2021-07-18 VITALS — BP 164/72 | HR 68 | Temp 98.8°F | Resp 16 | Ht 65.0 in | Wt 156.0 lb

## 2021-07-18 DIAGNOSIS — Z8541 Personal history of malignant neoplasm of cervix uteri: Secondary | ICD-10-CM | POA: Diagnosis present

## 2021-07-18 DIAGNOSIS — Z08 Encounter for follow-up examination after completed treatment for malignant neoplasm: Secondary | ICD-10-CM | POA: Insufficient documentation

## 2021-07-18 DIAGNOSIS — Z90710 Acquired absence of both cervix and uterus: Secondary | ICD-10-CM | POA: Insufficient documentation

## 2021-07-18 DIAGNOSIS — Z90722 Acquired absence of ovaries, bilateral: Secondary | ICD-10-CM | POA: Insufficient documentation

## 2021-07-18 DIAGNOSIS — Z8544 Personal history of malignant neoplasm of other female genital organs: Secondary | ICD-10-CM | POA: Insufficient documentation

## 2021-07-18 DIAGNOSIS — Z9079 Acquired absence of other genital organ(s): Secondary | ICD-10-CM | POA: Insufficient documentation

## 2021-07-18 DIAGNOSIS — L900 Lichen sclerosus et atrophicus: Secondary | ICD-10-CM | POA: Diagnosis not present

## 2021-07-18 DIAGNOSIS — Z8542 Personal history of malignant neoplasm of other parts of uterus: Secondary | ICD-10-CM | POA: Insufficient documentation

## 2021-07-18 DIAGNOSIS — C52 Malignant neoplasm of vagina: Secondary | ICD-10-CM

## 2021-07-18 DIAGNOSIS — Z923 Personal history of irradiation: Secondary | ICD-10-CM | POA: Insufficient documentation

## 2021-07-18 NOTE — Progress Notes (Signed)
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  Treatment History: Oncology History Overview Note  Patient followed due to HX pulmonary nodules noted 2011  Primary cancer of left upper lobe of lung ([place])   Staging form: Lung, AJCC 7th Edition   - Clinical stage from 09/27/2015: Stage IA (T1b, N0, M0) - Signed by Curt Bears, MD on 09/27/2015    Primary cancer of left upper lobe of lung ([place])  07/12/2015 Imaging   CT Chest IMPRESSION:  The solid component of the large part solid nodule in the left upper lobe measures 9 x 3 mm (mean diameter 6 mm.) persistent part solid nodules with solid components greater than or equal to 6 mm should be considered highly suspicious for pulmonary adenocarcinoma   08/01/2015 Initial Diagnosis   Primary cancer of left upper lobe of lung ([place])   08/01/2015 Surgery   PROCEDURE:  Left video-assisted thoracoscopy,           Wedge resection,           Thoracoscopic lingular sparing left upper lobectomy           Lymph node dissection           On-Q local anesthetic catheter placement   08/01/2015 Pathology Results   Lung, resection (segmental or lobe), Left Upper Lobe - ADENOCARCINOMA, WELL DIFFERENTIATED, SPANNING 2.5 CM. - THE SURGICAL RESECTION MARGINS ARE NEGATIVE FOR CARCINOMA. 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 She underwent a total abdominal hysterectomy and bilateral salpingo-oophorectomy, pelvic and para-aortic lymphadenectomy. She received postoperative whole pelvis radiation therapy.  Her other primary gynecologic problem has been lichen sclerosus managed with when necessary clobetasol.   Beginning in 2013 the patient had intermittent episodes of partial small bowel obstruction most likely secondary to  radiation injury to the terminal ileum.   In December 2016 the patient was found to have a new primary squamous cell carcinoma the vulva undergoing a modified radical vulvectomy on 02/06/2015. She underwent a modified radical vulvectomy at [facility] on 02/06/2015. She was found to have a 3 cm vulvar lesion of the posterior vulva extending into the posterior vagina. A rhomboid flap was required to close the incision.  Final pathology showed some close surgical margins. However, the specimen margins were toward the time of surgery and it was Dr Mora Bellman impression that they were widely around the primary cancer. She discussed options with the patient and they agreed to monitor her closely but not recommend any radiation therapy to the vulva.   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  Depth of invasion was 4 mm all margins were negative.  Follow-up PET scan March 23, 2017 showed no evidence of metastatic disease.   Examination of the vulva during routine surveillance on 07/20/19 showed a friable, verrucous lesion measuring approximately 2 cm was seen on the anterior wall of the suburethral vagina extending to the introitus and wrapping around to the right mid vagina. This was concerning for recurrence and was biopsied at that appointment. 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    On 08/11/19 she underwent total vaginectomy. Intraoperative findings were significant for a forshortened 3cm vagina, friable 4cm plaque on the anterior vagina  extending to the right fornix and left posterior vaginal wall. This necessitated a total vaginectomy to resect the lesion grossly. Her tissues were extremely friable due to age, prior radiation, menopause and lichen sclerosis. There was unavoidable fragmentation of the tissues during the resection. All gross visible disease was removed. Surgery was uncomplicated, and she  reported no postop pain.  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 The suspicious foci are present at the edges of tissue fragments.  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    Post-op PET scan in August, 2021 showed no metastatic disease.   The patient received adjuvant vaginal brachytherapy between 09/29/2019 through 10/12/2019.  She tolerated treatment very well with no toxicities.   I saw the patient on 12/28.  At the time of her visit, I was concerned for dysplastic changes versus radiation treatment within the vagina and on her vulva.   On 02/14/2021, the patient underwent exam under anesthesia, vaginal and vulvar biopsies.  Findings were notable for anatomic changes related to prior surgical treatment and radiation.  Significant atrophy was noted.  Multiple spots of hyperkeratosis versus acetowhite after application of acetic acid were found, all biopsied.  Final pathology revealed invasive squamous cell carcinoma at 5:00 of the vaginal biopsy.  Multiple other biopsies including a 1:00 periclitoral biopsy and apex of the vagina showed low-grade squamous intraepithelial lesion.  Biopsy from 7:00 within the vagina showed high-grade squamous intraepithelial lesion. Submitted biopsies are noted to be very small, superficial, and oriented.  Most of them have dysplasia or carcinoma extended to the lateral edges but without involvement of the base of the biopsy.  The one biopsy showing carcinoma is 1 mm thick and described as extending to the edge of the biopsy where the lesion is transected and therefore measurement likely does not accurately represent the final thickness.   PET scan was performed on 02/28/2021.  There was noted to be interval decrease in degree of  FDG uptake within the vaginal introitus compared to PET scan in 2021 after her last surgery.  No concerning findings for metastatic disease.  Comment was noted that there is mild uptake within a left inguinal lymph node but the morphology is benign.  Imaging was reviewed at our tumor conference on 1/23 with discussion being findings did not represent metastatic disease.   03/20/21: Vaginectomy, CO2 laser of the vulva for recurrent vaginal cancer, high-grade vulvar dysplasia.  Vaginal excision sowed squamous mucosa with ulceration and inflammation.  No malignancy or dysplasia identified.  Rare atypical "radiation" fibroblast embedded.  Interval History: Patient reports doing well.  Has been on a cruise and had a wonderful time since her last visit with me.  Is continuing to use the clobetasol up until beginning of this week.  This was after finishing a month of triple estrogen use.  She denies any vulvar vaginal pruritus, pain, discharge, or bleeding.  She reports regular bowel and bladder function.  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 S/p TAH and radiation x6 wks    Complication of anesthesia    took 2 days to wake up after 1 surgery at 20, pt can't use  a adult cath,needs peds. cath due to past injury   Constipation    very worried about this   Endometrial cancer [facility]) 1997   coincidental endometrial cancer also   History of radiation therapy 10/12/2019   [facility] HDR brachytherapy  09/29/2019-10/12/2019  Dr Gery Pray   Hyperlipemia    Hypertension    Hypothyroidism    Primary cancer of left upper lobe of lung ([place]) 08/01/2015   Small bowel obstruction ([place])    12/2008, repeat SBO in 02/2011 also with terminal ilietis    Thyroid disease    Vertebral body hemangioma    T12    Past Surgical History:  Procedure  Laterality Date   ABDOMINAL HYSTERECTOMY  Feb 1997   TAH/BSO, pelvic and periaortic lymphadenectomy    BACK SURGERY     CO2 LASER APPLICATION N/A 3/0/8657   Procedure: CO2 LASER APPLICATION to vagina and vuvla;  Surgeon: [name], MD;  Location: [facility];  Service: Gynecology;  Laterality: N/A;   EYE SURGERY     Lasik    LYMPH NODE DISSECTION Left 08/01/2015   Procedure: LYMPH NODE DISSECTION;  Surgeon: Melrose Nakayama, MD;  Location: [facility];  Service: Thoracic;  Laterality: Left;   PARTIAL NEPHRECTOMY     right   SEGMENTECOMY Left 08/01/2015   Procedure: Left Upper Lobe SEGMENTECTOMY;  Surgeon: Melrose Nakayama, MD;  Location: [place];  Service: Thoracic;  Laterality: Left;   SKIN BIOPSY     TUBAL LIGATION     TUMOR REMOVAL     meningioma  T 12   VAGINECTOMY, PARTIAL N/A 08/11/2019   Procedure: TOTAL VAGINECTOMY;  Surgeon: Everitt Amber, MD;  Location: [facility];  Service: Gynecology;  Laterality: N/A;   VAGINECTOMY, PARTIAL N/A 03/20/2021   Procedure: vaginal excision;  Surgeon: [name], MD;  Location: [facility];  Service: Gynecology;  Laterality: N/A;   VIDEO ASSISTED THORACOSCOPY (VATS)/WEDGE RESECTION Left 08/01/2015   Procedure: VIDEO ASSISTED THORACOSCOPY (VATS)/WEDGE RESECTION;  Surgeon: Melrose Nakayama, MD;  Location: [facility];  Service: Thoracic;  Laterality: Left;   VULVA Milagros Loll BIOPSY N/A 02/14/2021   Procedure: VAGINAL AND VULVAR BIOPSY;  Surgeon: [name], MD;  Location: [facility];  Service: Gynecology;  Laterality: N/A;   VULVA SURGERY     x 2, 1 at [facility] (2016), and [facility]     Family History  Problem Relation Age of Onset   Hypertension Other    Diabetes Other    Stroke Other    CAD Other    Colon cancer Neg Hx    Breast cancer Neg Hx    Ovarian cancer Neg Hx    Endometrial cancer Neg Hx    Pancreatic cancer Neg Hx    Prostate cancer Neg Hx     Social History    Socioeconomic History   Marital status: Divorced    Spouse name: Not on file   Number of children: Not on file   Years of education: Not on file   Highest education level: Not on file  Occupational History   Not on file  Tobacco Use   Smoking status: Never   Smokeless tobacco: Never  Vaping Use   Vaping Use: Never used  Substance and Sexual Activity   Alcohol use: No   Drug use: No   Sexual activity: Never  Other Topics Concern   Not on file  Social History Narrative   Lives at home  alone, works out at [employer] 6 days a week, still very active. Has an ex-husband who she is in contact with. Son died of an MI and another son in [place].    Social Determinants of Health   Financial Resource Strain: Not on file  Food Insecurity: Not on file  Transportation Needs: Not on file  Physical Activity: Not on file  Stress: Not on file  Social Connections: Not on file    Current Medications:  Current Outpatient Medications:    acyclovir (ZOVIRAX) 800 MG tablet, 1 tablet, Disp: , Rfl:    amLODipine (NORVASC) 5 MG tablet, SMARTSIG:2 Tablet(s) By Mouth Every Evening, Disp: , Rfl:    Ascorbic Acid (VITAMIN C PO), Vitamin C, Disp: , Rfl:    benazepril-hydrochlorthiazide (LOTENSIN HCT) 20-25 MG tablet, Take 1 tablet by mouth daily. , Disp: , Rfl:    BIOTIN PO, Take 1 tablet by mouth 4 (four) times a week., Disp: , Rfl:    Cholecalciferol (VITAMIN D3 PO), Take 1 tablet by mouth in the morning and at bedtime., Disp: , Rfl:    clobetasol cream (TEMOVATE) 0.05 %, Use fingertip size amount of cream on your finger and apply slightly past the opening of the vagina three times a week at night, Disp: 30 g, Rfl: 4   docusate sodium (COLACE) 100 MG capsule, Take 100 mg by mouth 2 (two) times daily., Disp: , Rfl:    estradiol (ESTRACE VAGINAL) 0.1 MG/GM vaginal cream, Use fingertip size amount of cream on your finger and apply slightly past the opening of the vagina three times a week at night,  Disp: 42.5 g, Rfl: 6   levothyroxine (SYNTHROID, LEVOTHROID) 75 MCG tablet, Take 75 mcg by mouth daily., Disp: , Rfl:    MAGNESIUM PO, Take 1 tablet by mouth daily., Disp: , Rfl:    Multiple Vitamin (MULTIVITAMIN WITH MINERALS) TABS tablet, Take 1 tablet by mouth daily., Disp: , Rfl:    Omega-3 Fatty Acids (FISH OIL PO), Take 1 capsule by mouth daily., Disp: , Rfl:    polyethylene glycol (MIRALAX / GLYCOLAX) packet, Take 11.3333 g by mouth at bedtime. 2/3 capful, Disp: , Rfl:    senna-docusate (SENOKOT-S) 8.6-50 MG tablet, Take 2 tablets by mouth at bedtime. For AFTER surgery, do not take if having diarrhea, Disp: 30 tablet, Rfl: 0   simvastatin (ZOCOR) 20 MG tablet, Take 20 mg by mouth at bedtime. , Disp: , Rfl:   Review of Systems: Denies appetite changes, fevers, chills, fatigue, unexplained weight changes. Denies hearing loss, neck lumps or masses, mouth sores, ringing in ears or voice changes. Denies cough or wheezing.  Denies shortness of breath. Denies chest pain or palpitations. Denies leg swelling. Denies abdominal distention, pain, blood in stools, constipation, diarrhea, nausea, vomiting, or early satiety. Denies pain with intercourse, dysuria, frequency, hematuria or incontinence. Denies hot flashes, pelvic pain, vaginal bleeding or vaginal discharge.   Denies joint pain, back pain or muscle pain/cramps. Denies itching, rash, or wounds. Denies dizziness, headaches, numbness or seizures. Denies swollen lymph nodes or glands, denies easy bruising or bleeding. Denies anxiety, depression, confusion, or decreased concentration.  Physical Exam: BP (!) 164/72 (BP Location: Right Arm, Patient Position: Sitting)   Pulse 68   Temp 98.8 F (37.1 C) (Tympanic)   Resp 16   Ht 5\' 5"  (1.651 m)   Wt 156 lb (70.8 kg)   SpO2 100%   BMI 25.96 kg/m  General: Alert, oriented, no acute distress. HEENT: Normocephalic, atraumatic,  sclera anicteric. Chest: Unlabored breathing on room  air. Extremities: Grossly normal range of motion.  Warm, well perfused.  No edema bilaterally. Skin: No rashes or lesions noted. Lymphatics: No cervical, supraclavicular, or inguinal adenopathy. GU: Loss of architecture of the vulva.  Atrophic, mildly erythematous skin on bilateral posterior vulva as well as anterior vulva consistent with lichen sclerosis.  Very mild areas of leukoplakia along the posterior vagina.  No atypical vasculature or ulcerations noted.  Laboratory & Radiologic Studies: None new  Assessment & Plan: [name] Richards is a 76 y.o. woman with recurrent vaginal cancer as well as high-grade vulvar dysplasia status post partial posterior vaginectomy and laser ablation of the vulva on 03/20/21.  The patient is overall doing well and is NED on exam today.  Discussed that at her next visit, we will plan to do an exam after application of acetic acid.  Given some thinning of her tissue, I have asked her to use vaginal estrogen for the next month and then go back to using clobetasol.  I stressed the importance of calling if she develops any symptoms in the interim.   I will see her back in 3 months for surveillance visit.  We discussed symptoms that should prompt a phone call to see me sooner.  22 minutes of total time was spent for this patient encounter, including preparation, face-to-face counseling with the patient and coordination of care, and documentation of the encounter.  Jeral Pinch, MD  Division of Gynecologic Oncology  Department of Obstetrics and Gynecology  [facility]

## 2021-07-18 NOTE — Patient Instructions (Addendum)
It was good to see you today.  I do not see or feel any evidence of cancer recurrence on your exam.  Like to continue with follow-up in 3 months.  If you develop any new and concerning symptoms before then, please call the clinic to see me sooner.  Please use the estrogen 3 times a week for the next month and then return to using clobetasol twice a week.

## 2021-07-23 DIAGNOSIS — S161XXA Strain of muscle, fascia and tendon at neck level, initial encounter: Secondary | ICD-10-CM | POA: Diagnosis not present

## 2021-08-28 DIAGNOSIS — E039 Hypothyroidism, unspecified: Secondary | ICD-10-CM | POA: Diagnosis not present

## 2021-08-28 DIAGNOSIS — E785 Hyperlipidemia, unspecified: Secondary | ICD-10-CM | POA: Diagnosis not present

## 2021-08-28 DIAGNOSIS — R7303 Prediabetes: Secondary | ICD-10-CM | POA: Diagnosis not present

## 2021-08-28 DIAGNOSIS — E559 Vitamin D deficiency, unspecified: Secondary | ICD-10-CM | POA: Diagnosis not present

## 2021-08-28 DIAGNOSIS — I10 Essential (primary) hypertension: Secondary | ICD-10-CM | POA: Diagnosis not present

## 2021-09-02 DIAGNOSIS — Z0000 Encounter for general adult medical examination without abnormal findings: Secondary | ICD-10-CM | POA: Diagnosis not present

## 2021-09-02 DIAGNOSIS — E039 Hypothyroidism, unspecified: Secondary | ICD-10-CM | POA: Diagnosis not present

## 2021-09-02 DIAGNOSIS — R7303 Prediabetes: Secondary | ICD-10-CM | POA: Diagnosis not present

## 2021-09-02 DIAGNOSIS — R609 Edema, unspecified: Secondary | ICD-10-CM | POA: Diagnosis not present

## 2021-09-02 DIAGNOSIS — I10 Essential (primary) hypertension: Secondary | ICD-10-CM | POA: Diagnosis not present

## 2021-09-02 DIAGNOSIS — I2510 Atherosclerotic heart disease of native coronary artery without angina pectoris: Secondary | ICD-10-CM | POA: Diagnosis not present

## 2021-09-02 DIAGNOSIS — E785 Hyperlipidemia, unspecified: Secondary | ICD-10-CM | POA: Diagnosis not present

## 2021-09-02 DIAGNOSIS — E559 Vitamin D deficiency, unspecified: Secondary | ICD-10-CM | POA: Diagnosis not present

## 2021-09-02 DIAGNOSIS — R3915 Urgency of urination: Secondary | ICD-10-CM | POA: Diagnosis not present

## 2021-09-02 DIAGNOSIS — J45991 Cough variant asthma: Secondary | ICD-10-CM | POA: Diagnosis not present

## 2021-09-02 DIAGNOSIS — N3090 Cystitis, unspecified without hematuria: Secondary | ICD-10-CM | POA: Diagnosis not present

## 2021-09-02 DIAGNOSIS — I700 Atherosclerosis of aorta: Secondary | ICD-10-CM | POA: Diagnosis not present

## 2021-10-10 DIAGNOSIS — Z03818 Encounter for observation for suspected exposure to other biological agents ruled out: Secondary | ICD-10-CM | POA: Diagnosis not present

## 2021-10-10 DIAGNOSIS — J40 Bronchitis, not specified as acute or chronic: Secondary | ICD-10-CM | POA: Diagnosis not present

## 2021-10-10 DIAGNOSIS — J45991 Cough variant asthma: Secondary | ICD-10-CM | POA: Diagnosis not present

## 2021-10-10 DIAGNOSIS — J069 Acute upper respiratory infection, unspecified: Secondary | ICD-10-CM | POA: Diagnosis not present

## 2021-10-13 DIAGNOSIS — U071 COVID-19: Secondary | ICD-10-CM | POA: Diagnosis not present

## 2021-10-13 DIAGNOSIS — R051 Acute cough: Secondary | ICD-10-CM | POA: Diagnosis not present

## 2021-10-13 DIAGNOSIS — Z20822 Contact with and (suspected) exposure to covid-19: Secondary | ICD-10-CM | POA: Diagnosis not present

## 2021-10-13 DIAGNOSIS — R5383 Other fatigue: Secondary | ICD-10-CM | POA: Diagnosis not present

## 2021-10-16 ENCOUNTER — Telehealth: Payer: Self-pay

## 2021-10-16 NOTE — Telephone Encounter (Signed)
Pt called to reschedule her appointment with Dr.Tucker on 9/11 stating she has COVID. Today is day 2. Rescheduled for 9/21, per COVID guidelines  this is more than 10 days out from initial positive testing. Pt agreed to date/time

## 2021-10-21 ENCOUNTER — Ambulatory Visit: Payer: Medicare Other | Admitting: Gynecologic Oncology

## 2021-10-24 ENCOUNTER — Other Ambulatory Visit: Payer: Self-pay

## 2021-10-24 ENCOUNTER — Emergency Department (HOSPITAL_COMMUNITY)
Admission: EM | Admit: 2021-10-24 | Discharge: 2021-10-25 | Disposition: A | Discharge status: Home / Self Care | Payer: Medicare Other | Attending: Emergency Medicine | Admitting: Emergency Medicine

## 2021-10-24 ENCOUNTER — Encounter (HOSPITAL_COMMUNITY): Payer: Self-pay

## 2021-10-24 ENCOUNTER — Emergency Department (HOSPITAL_COMMUNITY): Payer: Medicare Other

## 2021-10-24 DIAGNOSIS — I700 Atherosclerosis of aorta: Secondary | ICD-10-CM | POA: Diagnosis not present

## 2021-10-24 DIAGNOSIS — Z8544 Personal history of malignant neoplasm of other female genital organs: Secondary | ICD-10-CM | POA: Diagnosis not present

## 2021-10-24 DIAGNOSIS — Z8541 Personal history of malignant neoplasm of cervix uteri: Secondary | ICD-10-CM | POA: Insufficient documentation

## 2021-10-24 DIAGNOSIS — R059 Cough, unspecified: Secondary | ICD-10-CM | POA: Diagnosis not present

## 2021-10-24 DIAGNOSIS — R1084 Generalized abdominal pain: Secondary | ICD-10-CM | POA: Diagnosis not present

## 2021-10-24 DIAGNOSIS — R112 Nausea with vomiting, unspecified: Secondary | ICD-10-CM | POA: Insufficient documentation

## 2021-10-24 DIAGNOSIS — E039 Hypothyroidism, unspecified: Secondary | ICD-10-CM | POA: Diagnosis not present

## 2021-10-24 DIAGNOSIS — Z79899 Other long term (current) drug therapy: Secondary | ICD-10-CM | POA: Insufficient documentation

## 2021-10-24 DIAGNOSIS — Z8542 Personal history of malignant neoplasm of other parts of uterus: Secondary | ICD-10-CM | POA: Diagnosis not present

## 2021-10-24 DIAGNOSIS — Z85118 Personal history of other malignant neoplasm of bronchus and lung: Secondary | ICD-10-CM | POA: Insufficient documentation

## 2021-10-24 DIAGNOSIS — J45909 Unspecified asthma, uncomplicated: Secondary | ICD-10-CM | POA: Insufficient documentation

## 2021-10-24 DIAGNOSIS — I10 Essential (primary) hypertension: Secondary | ICD-10-CM | POA: Diagnosis not present

## 2021-10-24 DIAGNOSIS — R109 Unspecified abdominal pain: Secondary | ICD-10-CM | POA: Diagnosis not present

## 2021-10-24 LAB — COMPREHENSIVE METABOLIC PANEL
ALT: 21 U/L (ref 0–44)
AST: 20 U/L (ref 15–41)
Albumin: 4.3 g/dL (ref 3.5–5.0)
Alkaline Phosphatase: 91 U/L (ref 38–126)
Anion gap: 12 (ref 5–15)
BUN: 14 mg/dL (ref 8–23)
CO2: 28 mmol/L (ref 22–32)
Calcium: 10.1 mg/dL (ref 8.9–10.3)
Chloride: 93 mmol/L — ABNORMAL LOW (ref 98–111)
Creatinine, Ser: 0.69 mg/dL (ref 0.44–1.00)
GFR, Estimated: >60 mL/min (ref >60)
Glucose, Bld: 155 mg/dL — ABNORMAL HIGH (ref 70–99)
Potassium: 3.4 mmol/L — ABNORMAL LOW (ref 3.5–5.1)
Sodium: 133 mmol/L — ABNORMAL LOW (ref 135–145)
Total Bilirubin: 1 mg/dL (ref 0.3–1.2)
Total Protein: 7.3 g/dL (ref 6.5–8.1)

## 2021-10-24 LAB — CBC WITH DIFFERENTIAL/PLATELET
Abs Immature Granulocytes: 0.05 10*3/uL (ref 0.00–0.07)
Basophils Absolute: 0 10*3/uL (ref 0.0–0.1)
Basophils Relative: 0 %
Eosinophils Absolute: 0 10*3/uL (ref 0.0–0.5)
Eosinophils Relative: 0 %
HCT: 42.2 % (ref 36.0–46.0)
Hemoglobin: 13.6 g/dL (ref 12.0–15.0)
Immature Granulocytes: 1 %
Lymphocytes Relative: 9 %
Lymphs Abs: 0.9 10*3/uL (ref 0.7–4.0)
MCH: 21.1 pg — ABNORMAL LOW (ref 26.0–34.0)
MCHC: 32.2 g/dL (ref 30.0–36.0)
MCV: 65.3 fL — ABNORMAL LOW (ref 80.0–100.0)
Monocytes Absolute: 0.5 10*3/uL (ref 0.1–1.0)
Monocytes Relative: 5 %
Neutro Abs: 8.8 10*3/uL — ABNORMAL HIGH (ref 1.7–7.7)
Neutrophils Relative %: 85 %
Platelets: 377 10*3/uL (ref 150–400)
RBC: 6.46 MIL/uL — ABNORMAL HIGH (ref 3.87–5.11)
RDW: 15.6 % — ABNORMAL HIGH (ref 11.5–15.5)
WBC: 10.3 10*3/uL (ref 4.0–10.5)
nRBC: 0 % (ref 0.0–0.2)

## 2021-10-24 LAB — LIPASE, BLOOD: Lipase: 47 U/L (ref 11–51)

## 2021-10-24 MED ORDER — ONDANSETRON HCL 4 MG/2ML IJ SOLN
4.0000 mg | Freq: Once | INTRAMUSCULAR | Status: AC
Start: 2021-10-24 — End: 2021-10-24
  Administered 2021-10-24: 4 mg via INTRAVENOUS
  Filled 2021-10-24: qty 2

## 2021-10-24 MED ORDER — SODIUM CHLORIDE 0.9 % IV BOLUS
1000.0000 mL | Freq: Once | INTRAVENOUS | Status: AC
  Administered 2021-10-24: 1000 mL via INTRAVENOUS

## 2021-10-24 MED ORDER — MORPHINE SULFATE (PF) 4 MG/ML IV SOLN
4.0000 mg | Freq: Once | INTRAVENOUS | Status: AC
  Administered 2021-10-24: 4 mg via INTRAVENOUS
  Filled 2021-10-24: qty 1

## 2021-10-24 MED ORDER — IOHEXOL 300 MG/ML  SOLN
100.0000 mL | Freq: Once | INTRAMUSCULAR | Status: AC | PRN
  Administered 2021-10-24: 100 mL via INTRAVENOUS

## 2021-10-24 NOTE — Discharge Instructions (Addendum)
We evaluated you in the emergency department for your nausea, vomiting, and abdominal pain.  Your work-up was reassuring and your CT scan was negative for any dangerous process.  Although your tests were reassuring, please keep a very close eye on your symptoms.  There is always a chance that your symptoms may return.  If you experience recurrent abdominal pain, vomiting, diarrhea, fevers, please return to the emergency department for reassessment.  Please follow-up closely with your primary doctor.

## 2021-10-24 NOTE — ED Triage Notes (Addendum)
Ambulatory to ED with c/o abd pain, vomiting, and diarrhea starting today. Hx of SBO.   States she is dehydrated. Recently got over COVID and bronchitis.

## 2021-10-24 NOTE — ED Provider Triage Note (Signed)
Emergency Medicine Provider Triage Evaluation Note  [name] Richards , a 76 y.o. female  was evaluated in triage.  Pt complains of vomiting and abdominal pain. Started this morning after BM and throwing up. H/o partially obstructed distal portion of the small bowel about 10 years ago. Located in middle of the abdomen. Non radiating. Intermittent and sharp pain. Denies hematuria, bloody stools and hemoptysis. Took zofran. Recent covid infection. Finished paxlovid on the 8th. Denies fever.   H/o uterine cancer in 1997, vaginal cancer 2021, endometrial cancer   Review of Systems  Positive: See above Negative: See above  Physical Exam  BP (!) 174/88 (BP Location: Right Arm)   Pulse 79   Temp 99.9 F (37.7 C) (Oral)   Resp 18   Ht 5\' 5"  (1.651 m)   Wt 68 kg   SpO2 100%   BMI 24.96 kg/m  Gen:   Awake, no distress   Resp:  Normal effort  MSK:   Moves extremities without difficulty   Abd:  Other:    Medical Decision Making  Medically screening exam initiated at 9:08 PM.  Appropriate orders placed.  [name] Richards was informed that the remainder of the evaluation will be completed by another provider, this initial triage assessment does not replace that evaluation, and the importance of remaining in the ED until their evaluation is complete.  Abdominal labs, zofran, fluids, ct scan   Harriet Pho, PA-C 10/24/21 2119

## 2021-10-24 NOTE — ED Notes (Signed)
Pt needs IV for CT  

## 2021-10-25 NOTE — ED Provider Notes (Signed)
[facility] DEPT Provider Note  CSN: [id number] Arrival date & time: 10/24/21 1930  Chief Complaint(s) Dehydration  HPI [name] Richards is a 76 y.o. female with history of recurrent vaginal cancer, hypertension, hyperlipidemia, prior small bowel obstruction thought to be related to radiation damage presenting to the emergency department with nausea and vomiting.  Patient reports that this morning she developed nausea, vomiting, diffuse abdominal pain.  She reports that she was still having bowel movements no diarrhea.  No fevers or chills, dysuria.  She reports some recent coughing but no productive cough.  She reports that it felt like similar to prior bowel obstructions, has since improved significantly.  No chest pain, shortness of breath, lightheadedness, dizziness, diaphoresis.  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 S/p TAH and radiation x6 wks    Complication of anesthesia    took 2 days to wake up after 1 surgery at 20, pt can't use a adult cath,needs peds. cath due to past injury   Constipation    very worried about this   Endometrial cancer [facility]) 1997   coincidental endometrial cancer also   History of radiation therapy 10/12/2019   [facility] HDR brachytherapy  09/29/2019-10/12/2019  Dr Gery Pray   Hyperlipemia    Hypertension    Hypothyroidism    Primary cancer of left upper lobe of lung ([place]) 08/01/2015   Small bowel obstruction ([place])    12/2008, repeat SBO in 02/2011 also with terminal ilietis    Thyroid disease    Vertebral body hemangioma    T12   Patient Active Problem List   Diagnosis Date Noted   Vulvar dysplasia    Recurrent vaginal cancer ([place]) 03/11/2021   VAIN III (vaginal intraepithelial neoplasia grade III) 65/04/5463   Lichen sclerosus  68/01/7516   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   Primary cancer of left upper lobe of lung ([place]) 08/01/2015   SBO (small bowel obstruction) ([place]) 03/31/2015   Hypokalemia 03/31/2015   Hypothyroidism 03/31/2015   Cervix cancer ([place]) 03/24/2011   History of small bowel obstruction 03/24/2011   Small bowel obstruction ([place]) 03/08/2011   Regional enteritis of ileum ([place]) 02/21/2011   Solitary pulmonary nodule on lung CT 10/18/2009   Malignant neoplasm of cervix uteri ([place]) 09/07/2009   CANCER, ENDOMETRIUM 09/07/2009   HYPERLIPIDEMIA 09/07/2009   Essential hypertension 09/07/2009   ALLERGIC RHINITIS 09/07/2009   PLEURAL EFFUSION, RIGHT 09/07/2009   DYSPNEA 09/07/2009   Home Medication(s) Prior to Admission medications   Medication Sig Start Date End Date Taking? Authorizing Provider  acyclovir (ZOVIRAX) 800 MG tablet Take 800 mg by mouth daily as needed (cold sores). 02/07/21  Yes [provider]  albuterol (VENTOLIN HFA) 108 (90 Base) MCG/ACT inhaler Inhale 2 puffs into the lungs every 6 (six) hours as needed for wheezing or shortness of breath. 09/02/21  Yes [provider]  amLODipine (NORVASC) 5 MG tablet Take 5 mg by mouth every evening. 01/10/21  Yes [provider]  Ascorbic Acid (VITAMIN C PO) Take 1 tablet by mouth daily.   Yes [provider]  benazepril-hydrochlorthiazide (LOTENSIN HCT) 20-25 MG tablet Take 1 tablet by mouth daily.    Yes [provider]  BIOTIN PO Take 1 tablet by mouth daily.   Yes [provider]  Cholecalciferol (VITAMIN D3 PO) Take 1 tablet by mouth in the morning and at bedtime.   Yes [provider]  clobetasol cream (TEMOVATE) 0.05 % Use fingertip size amount of cream on your finger and apply slightly past the opening of the vagina three times a week at night 05/27/21  Yes Cross, Melissa D, NP  docusate sodium (COLACE) 100 MG capsule Take 100 mg by mouth 2 (two) times daily.   Yes [provider]  estradiol (ESTRACE VAGINAL) 0.1 MG/GM vaginal cream Use fingertip size amount of cream on your finger and apply slightly past the opening of the vagina three times a week at night 04/15/21  Yes Cross, Melissa D, NP  furosemide (LASIX) 20 MG tablet Take 20 mg by mouth daily as needed for fluid. 09/02/21  Yes [provider]  levothyroxine (SYNTHROID, LEVOTHROID) 75 MCG tablet Take 75 mcg by mouth daily.   Yes [provider]  MAGNESIUM PO Take 1 tablet by mouth every evening.   Yes [provider]  Multiple Vitamin (MULTIVITAMIN WITH MINERALS) TABS tablet Take 1 tablet by mouth daily.   Yes [provider]  Omega-3 Fatty Acids (FISH OIL PO) Take 1 capsule by mouth daily.   Yes [provider]  ondansetron (ZOFRAN-ODT) 4 MG disintegrating tablet Take 4 mg by mouth every 8 (eight) hours as needed for nausea or vomiting. 10/16/21  Yes [provider]  polyethylene glycol (MIRALAX / GLYCOLAX) packet Take 11.3333 g by mouth at bedtime. 2/3 capful   Yes [provider]  simvastatin (ZOCOR) 20 MG tablet Take 20 mg by mouth at bedtime.    Yes [provider]  TURMERIC PO Take 1 capsule by mouth every evening.   Yes [provider]                                                                                                                                    Past Surgical History Past Surgical History:  Procedure Laterality Date   ABDOMINAL HYSTERECTOMY  Feb 1997   TAH/BSO, pelvic and periaortic lymphadenectomy    BACK SURGERY     CO2 LASER APPLICATION N/A 02/15/1094   Procedure: CO2 LASER APPLICATION to vagina and vuvla;  Surgeon: [name], MD;  Location: [facility];  Service: Gynecology;  Laterality: N/A;   EYE SURGERY     Lasik    LYMPH NODE DISSECTION Left 08/01/2015   Procedure: LYMPH NODE DISSECTION;  Surgeon: Melrose Nakayama, MD;  Location: [facility];  Service: Thoracic;   Laterality: Left;   PARTIAL NEPHRECTOMY     right   SEGMENTECOMY Left 08/01/2015   Procedure: Left Upper Lobe SEGMENTECTOMY;  Surgeon: Melrose Nakayama, MD;  Location: [facility];  Service: Thoracic;  Laterality: Left;   SKIN BIOPSY     TUBAL LIGATION     TUMOR REMOVAL     meningioma  T 12  VAGINECTOMY, PARTIAL N/A 08/11/2019   Procedure: TOTAL VAGINECTOMY;  Surgeon: Everitt Amber, MD;  Location: [facility];  Service: Gynecology;  Laterality: N/A;   VAGINECTOMY, PARTIAL N/A 03/20/2021   Procedure: vaginal excision;  Surgeon: [name], MD;  [facility];  Service: Gynecology;  Laterality: N/A;   VIDEO ASSISTED THORACOSCOPY (VATS)/WEDGE RESECTION Left 08/01/2015   Procedure: VIDEO ASSISTED THORACOSCOPY (VATS)/WEDGE RESECTION;  Surgeon: Melrose Nakayama, MD;  Location: [facility];  Service: Thoracic;  Laterality: Left;   VULVA Milagros Loll BIOPSY N/A 02/14/2021   Procedure: VAGINAL AND VULVAR BIOPSY;  Surgeon: [name], MD;  Location[facility];  Service: Gynecology;  Laterality: N/A;   VULVA SURGERY     x 2, 1 at [facility] (2016), and [facility]    Family History Family History  Problem Relation Age of Onset   Hypertension Other    Diabetes Other    Stroke Other    CAD Other    Colon cancer Neg Hx    Breast cancer Neg Hx    Ovarian cancer Neg Hx    Endometrial cancer Neg Hx    Pancreatic cancer Neg Hx    Prostate cancer Neg Hx     Social History Social History   Tobacco Use   Smoking status: Never   Smokeless tobacco: Never  Vaping Use   Vaping Use: Never used  Substance Use Topics   Alcohol use: No   Drug use: No   Allergies Penicillins, Fentanyl, Codeine, Percocet [oxycodone-acetaminophen], Prochlorperazine, and Trazodone hcl  Review of Systems Review of Systems  All other systems reviewed and are negative.   Physical Exam Vital Signs  I have reviewed the triage vital signs BP 123/85   Pulse 70   Temp 99.9 F  (37.7 C) (Oral)   Resp 16   Ht 5\' 5"  (1.651 m)   Wt 68 kg   SpO2 95%   BMI 24.96 kg/m  Physical Exam Vitals and nursing note reviewed.  Constitutional:      General: She is not in acute distress.    Appearance: She is well-developed.  HENT:     Head: Normocephalic and atraumatic.     Mouth/Throat:     Mouth: Mucous membranes are moist.  Eyes:     Pupils: Pupils are equal, round, and reactive to light.  Cardiovascular:     Rate and Rhythm: Normal rate and regular rhythm.     Heart sounds: No murmur heard. Pulmonary:     Effort: Pulmonary effort is normal. No respiratory distress.     Breath sounds: Normal breath sounds.  Abdominal:     General: Abdomen is flat.     Palpations: Abdomen is soft.     Tenderness: There is no abdominal tenderness.  Musculoskeletal:        General: No tenderness.     Right lower leg: No edema.     Left lower leg: No edema.  Skin:    General: Skin is warm and dry.  Neurological:     General: No focal deficit present.     Mental Status: She is alert. Mental status is at baseline.  Psychiatric:        Mood and Affect: Mood normal.        Behavior: Behavior normal.     ED Results and Treatments Labs (all labs ordered are listed, but only abnormal results are displayed) Labs Reviewed  CBC WITH DIFFERENTIAL/PLATELET - Abnormal; Notable for the following components:  Result Value   RBC 6.46 (*)    MCV 65.3 (*)    MCH 21.1 (*)    RDW 15.6 (*)    Neutro Abs 8.8 (*)    All other components within normal limits  COMPREHENSIVE METABOLIC PANEL - Abnormal; Notable for the following components:   Sodium 133 (*)    Potassium 3.4 (*)    Chloride 93 (*)    Glucose, Bld 155 (*)    All other components within normal limits  LIPASE, BLOOD                                                                                                                          Radiology DG Chest 1 View  Result Date: 10/24/2021 CLINICAL DATA:  Cough EXAM:  CHEST  1 VIEW COMPARISON:  CT chest 09/13/2018 and radiographs 11/06/2015 FINDINGS: Postoperative changes of left upper lobe wedge resection. Normal cardiomediastinal silhouette. No focal consolidation, pleural effusion, or pneumothorax. No acute osseous abnormality. IMPRESSION: No active disease. Electronically Signed   By: Placido Sou M.D.   On: 10/24/2021 23:20   CT Abdomen Pelvis W Contrast  Result Date: 10/24/2021 CLINICAL DATA:  Abdominal pain EXAM: CT ABDOMEN AND PELVIS WITH CONTRAST TECHNIQUE: Multidetector CT imaging of the abdomen and pelvis was performed using the standard protocol following bolus administration of intravenous contrast. RADIATION DOSE REDUCTION: This exam was performed according to the departmental dose-optimization program which includes automated exposure control, adjustment of the mA and/or kV according to patient size and/or use of iterative reconstruction technique. CONTRAST:  167mL OMNIPAQUE IOHEXOL 300 MG/ML  SOLN COMPARISON:  03/31/2015 FINDINGS: Lower Chest: Normal. Hepatobiliary: Normal hepatic contours. No intra- or extrahepatic biliary dilatation. The gallbladder is normal. Pancreas: Normal pancreas. No ductal dilatation or peripancreatic fluid collection. Spleen: Normal. Adrenals/Urinary Tract: The adrenal glands are normal. No hydronephrosis, nephroureterolithiasis or solid renal mass. The urinary bladder is normal for degree of distention Stomach/Bowel: There is no hiatal hernia. Normal duodenal course and caliber. No small bowel dilatation or inflammation. No focal colonic abnormality. Normal appendix. Vascular/Lymphatic: There is calcific atherosclerosis of the abdominal aorta. Retroperitoneal surgical clips from the infrarenal region to both pelvic sidewalls. Reproductive: Status post hysterectomy. No adnexal mass. Small amount of fluid in the pelvis. Other: None. Musculoskeletal: Grade 1 anterolisthesis at L4-5 and L5-S1. IMPRESSION: 1. No acute abnormality of  the abdomen or pelvis. 2. Aortic Atherosclerosis (ICD10-I70.0). Electronically Signed   By: Ulyses Jarred M.D.   On: 10/24/2021 23:02    Pertinent labs & imaging results that were available during my care of the patient were reviewed by me and considered in my medical decision making (see MDM for details).  Medications Ordered in ED Medications  ondansetron (ZOFRAN) injection 4 mg (4 mg Intravenous Given 10/24/21 2137)  morphine (PF) 4 MG/ML injection 4 mg (4 mg Intravenous Given 10/24/21 2217)  sodium chloride 0.9 % bolus 1,000 mL (0 mLs Intravenous Stopped 10/24/21 2325)  iohexol (OMNIPAQUE) 300 MG/ML solution  100 mL (100 mLs Intravenous Contrast Given 10/24/21 2227)                                                                                                                                     Procedures Procedures  (including critical care time)  Medical Decision Making / ED Course   MDM:  77 year old female presenting to the emergency department with abdominal pain.  In the emergency department, the patient's symptoms had improved, after pain control antiemetics reports that they have resolved.  Differential included small bowel obstruction, perforation, volvulus, pancreatitis, cholecystitis.  Labs overall reassuring.  No elevated WBC count.  No sign of dehydration, no elevated creatinine.  Lipase negative.  CT scan performed without evidence of acute intra-abdominal process.  Patient's symptoms have subsequently resolved.  Given that she reports it is similar to prior bowel obstruction, it is possible the patient had a brief episode of obstruction which is since resolved, versus gastroenteritis.  Doubt other causes of nausea and vomiting such as ACS, cranial injury, no chest pain or headaches, no shortness of breath.  Given resolution of symptoms, patient able to tolerate p.o., will discharge.  Advise close follow-up with primary physician and strict return precautions given age and  medical history. Will discharge patient to home. All questions answered. Patient comfortable with plan of discharge. Return precautions discussed with patient and specified on the after visit summary.        Additional history obtained: -Additional history obtained from friend -External records from outside source obtained and reviewed including: Chart review including previous notes, labs, imaging, consultation notes   Lab Tests: -I ordered, reviewed, and interpreted labs.   The pertinent results include:   Labs Reviewed  CBC WITH DIFFERENTIAL/PLATELET - Abnormal; Notable for the following components:      Result Value   RBC 6.46 (*)    MCV 65.3 (*)    MCH 21.1 (*)    RDW 15.6 (*)    Neutro Abs 8.8 (*)    All other components within normal limits  COMPREHENSIVE METABOLIC PANEL - Abnormal; Notable for the following components:   Sodium 133 (*)    Potassium 3.4 (*)    Chloride 93 (*)    Glucose, Bld 155 (*)    All other components within normal limits  LIPASE, BLOOD       Imaging Studies ordered: I ordered imaging studies including CT a/P, CXR On my interpretation imaging demonstrates clear lungs, no acute intraabdominal process I independently visualized and interpreted imaging. I agree with the radiologist interpretation   Medicines ordered and prescription drug management: Meds ordered this encounter  Medications   ondansetron (ZOFRAN) injection 4 mg   morphine (PF) 4 MG/ML injection 4 mg   sodium chloride 0.9 % bolus 1,000 mL   iohexol (OMNIPAQUE) 300 MG/ML solution 100 mL    -I have reviewed the patients home medicines and have made adjustments as  needed   Cardiac Monitoring: The patient was maintained on a cardiac monitor.  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 S/p TAH and radiation x6 wks    Complication of anesthesia    took 2 days to wake up after 1 surgery at 20, pt can't use a adult cath,needs peds. cath due to past injury   Constipation    very worried about this   Endometrial cancer [facility]) 1997   coincidental endometrial cancer also   History of radiation therapy 10/12/2019   [facility] HDR brachytherapy  09/29/2019-10/12/2019  Dr Gery Pray   Hyperlipemia    Hypertension    Hypothyroidism    Primary cancer of left upper lobe of lung ([place]) 08/01/2015   Small bowel obstruction ([place])    12/2008, repeat SBO in 02/2011 also with terminal ilietis    Thyroid disease    Vertebral body hemangioma    T12      Dispostion: Discharge    Final Clinical Impression(s) / ED Diagnoses Final diagnoses:  Generalized abdominal pain     This chart was dictated using voice recognition software.  Despite best efforts to proofread,  errors can occur which can change the documentation meaning.    Cristie Hem, MD 10/25/21 1623

## 2021-10-28 DIAGNOSIS — R197 Diarrhea, unspecified: Secondary | ICD-10-CM | POA: Diagnosis not present

## 2021-10-28 DIAGNOSIS — I10 Essential (primary) hypertension: Secondary | ICD-10-CM | POA: Diagnosis not present

## 2021-10-28 DIAGNOSIS — E876 Hypokalemia: Secondary | ICD-10-CM | POA: Diagnosis not present

## 2021-10-29 ENCOUNTER — Encounter: Payer: Self-pay | Admitting: Gynecologic Oncology

## 2021-10-31 ENCOUNTER — Other Ambulatory Visit: Payer: Self-pay

## 2021-10-31 ENCOUNTER — Inpatient Hospital Stay: Payer: Medicare Other | Attending: Gynecologic Oncology | Admitting: Gynecologic Oncology

## 2021-10-31 ENCOUNTER — Encounter: Payer: Self-pay | Admitting: Gynecologic Oncology

## 2021-10-31 VITALS — BP 140/80 | HR 72 | Temp 98.7°F | Resp 16 | Ht 65.0 in

## 2021-10-31 DIAGNOSIS — L900 Lichen sclerosus et atrophicus: Secondary | ICD-10-CM | POA: Diagnosis not present

## 2021-10-31 DIAGNOSIS — D071 Carcinoma in situ of vulva: Secondary | ICD-10-CM | POA: Diagnosis not present

## 2021-10-31 DIAGNOSIS — Z8541 Personal history of malignant neoplasm of cervix uteri: Secondary | ICD-10-CM | POA: Insufficient documentation

## 2021-10-31 DIAGNOSIS — C52 Malignant neoplasm of vagina: Secondary | ICD-10-CM

## 2021-10-31 DIAGNOSIS — Z85118 Personal history of other malignant neoplasm of bronchus and lung: Secondary | ICD-10-CM | POA: Insufficient documentation

## 2021-10-31 DIAGNOSIS — Z8542 Personal history of malignant neoplasm of other parts of uterus: Secondary | ICD-10-CM | POA: Insufficient documentation

## 2021-10-31 DIAGNOSIS — Z79899 Other long term (current) drug therapy: Secondary | ICD-10-CM

## 2021-10-31 NOTE — Patient Instructions (Addendum)
It was good to see you today.  On your exam, I see some changes along the backside of the vagina that may represent low-grade dysplasia or low-grade precancer.  Keep using the clobetasol although I would use it only once or twice a week.  For the estrogen that you are using when not using the clobetasol, you can use this 3 times a week.  I will see you for follow-up in 3 months.  Please call if you develop any new symptoms before your next visit.

## 2021-10-31 NOTE — Progress Notes (Signed)
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  Treatment History: Oncology History Overview Note  Patient followed due to HX pulmonary nodules noted 2011  Primary cancer of left upper lobe of lung ([place])   Staging form: Lung, AJCC 7th Edition   - Clinical stage from 09/27/2015: Stage IA (T1b, N0, M0) - Signed by Curt Bears, MD on 09/27/2015    Primary cancer of left upper lobe of lung ([place])  07/12/2015 Imaging   CT Chest IMPRESSION:  The solid component of the large part solid nodule in the left upper lobe measures 9 x 3 mm (mean diameter 6 mm.) persistent part solid nodules with solid components greater than or equal to 6 mm should be considered highly suspicious for pulmonary adenocarcinoma   08/01/2015 Initial Diagnosis   Primary cancer of left upper lobe of lung ([place])   08/01/2015 Surgery   PROCEDURE:  Left video-assisted thoracoscopy,           Wedge resection,           Thoracoscopic lingular sparing left upper lobectomy           Lymph node dissection           On-Q local anesthetic catheter placement   08/01/2015 Pathology Results   Lung, resection (segmental or lobe), Left Upper Lobe - ADENOCARCINOMA, WELL DIFFERENTIATED, SPANNING 2.5 CM. - THE SURGICAL RESECTION MARGINS ARE NEGATIVE FOR CARCINOMA. 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 She underwent a total abdominal hysterectomy and bilateral salpingo-oophorectomy, pelvic and para-aortic lymphadenectomy. She received postoperative whole pelvis radiation therapy.  Her other primary gynecologic problem has been lichen sclerosus managed with when necessary clobetasol.   Beginning in 2013 the patient had intermittent episodes of partial small bowel obstruction most likely secondary to  radiation injury to the terminal ileum.   In December 2016 the patient was found to have a new primary squamous cell carcinoma the vulva undergoing a modified radical vulvectomy on 02/06/2015. She underwent a modified radical vulvectomy at [facility] on 02/06/2015. She was found to have a 3 cm vulvar lesion of the posterior vulva extending into the posterior vagina. A rhomboid flap was required to close the incision.  Final pathology showed some close surgical margins. However, the specimen margins were toward the time of surgery and it was Dr Mora Bellman impression that they were widely around the primary cancer. She discussed options with the patient and they agreed to monitor her closely but not recommend any radiation therapy to the vulva.   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  Depth of invasion was 4 mm all margins were negative.  Follow-up PET scan March 23, 2017 showed no evidence of metastatic disease.   Examination of the vulva during routine surveillance on 07/20/19 showed a friable, verrucous lesion measuring approximately 2 cm was seen on the anterior wall of the suburethral vagina extending to the introitus and wrapping around to the right mid vagina. This was concerning for recurrence and was biopsied at that appointment. 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    On 08/11/19 she underwent total vaginectomy. Intraoperative findings were significant for a forshortened 3cm vagina, friable 4cm plaque on the anterior vagina  extending to the right fornix and left posterior vaginal wall. This necessitated a total vaginectomy to resect the lesion grossly. Her tissues were extremely friable due to age, prior radiation, menopause and lichen sclerosis. There was unavoidable fragmentation of the tissues during the resection. All gross visible disease was removed. Surgery was uncomplicated, and she  reported no postop pain.  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 The suspicious foci are present at the edges of tissue fragments.  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    Post-op PET scan in August, 2021 showed no metastatic disease.   The patient received adjuvant vaginal brachytherapy between 09/29/2019 through 10/12/2019.  She tolerated treatment very well with no toxicities.   I saw the patient on 12/28.  At the time of her visit, I was concerned for dysplastic changes versus radiation treatment within the vagina and on her vulva.   On 02/14/2021, the patient underwent exam under anesthesia, vaginal and vulvar biopsies.  Findings were notable for anatomic changes related to prior surgical treatment and radiation.  Significant atrophy was noted.  Multiple spots of hyperkeratosis versus acetowhite after application of acetic acid were found, all biopsied.  Final pathology revealed invasive squamous cell carcinoma at 5:00 of the vaginal biopsy.  Multiple other biopsies including a 1:00 periclitoral biopsy and apex of the vagina showed low-grade squamous intraepithelial lesion.  Biopsy from 7:00 within the vagina showed high-grade squamous intraepithelial lesion. Submitted biopsies are noted to be very small, superficial, and oriented.  Most of them have dysplasia or carcinoma extended to the lateral edges but without involvement of the base of the biopsy.  The one biopsy showing carcinoma is 1 mm thick and described as extending to the edge of the biopsy where the lesion is transected and therefore measurement likely does not accurately represent the final thickness.   PET scan was performed on 02/28/2021.  There was noted to be interval decrease in degree of  FDG uptake within the vaginal introitus compared to PET scan in 2021 after her last surgery.  No concerning findings for metastatic disease.  Comment was noted that there is mild uptake within a left inguinal lymph node but the morphology is benign.  Imaging was reviewed at our tumor conference on 1/23 with discussion being findings did not represent metastatic disease.   03/20/21: Vaginectomy, CO2 laser of the vulva for recurrent vaginal cancer, high-grade vulvar dysplasia.  Vaginal excision showed squamous mucosa with ulceration and inflammation.  No malignancy or dysplasia identified.  Rare atypical "radiation" fibroblast embedded.  Interval History: Doing well although has struggled over the last month.  She had concurrent COVID and bronchitis last month.  Presented to the emergency department last week secondary to bowel obstruction symptoms.  This resolved with conservative management.  She is alternating between using estrogen and clobetasol, every month.  She is currently using estrogen.  She notes very infrequent vulvar pruritus.  Otherwise denies vulvar pain, bleeding, discharge.  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 S/p TAH and radiation x6 wks    Complication of anesthesia    took 2 days to wake up after 1 surgery at  20, pt can't use a adult cath,needs peds. cath due to past injury   Constipation    very worried about this   Endometrial cancer [facility]) 1997   coincidental endometrial cancer also   History of radiation therapy 10/12/2019   [facility] HDR brachytherapy  09/29/2019-10/12/2019  Dr Gery Pray   Hyperlipemia    Hypertension    Hypothyroidism    Primary cancer of left upper lobe of lung ([place]) 08/01/2015   Small bowel obstruction ([place])    12/2008, repeat SBO in 02/2011 also with terminal ilietis    Thyroid  disease    Vertebral body hemangioma    T12    Past Surgical History:  Procedure Laterality Date   ABDOMINAL HYSTERECTOMY  Feb 1997   TAH/BSO, pelvic and periaortic lymphadenectomy    BACK SURGERY     CO2 LASER APPLICATION N/A 08/18/5883   Procedure: CO2 LASER APPLICATION to vagina and vuvla;  Surgeon: [name], MD;  Location: [facility];  Service: Gynecology;  Laterality: N/A;   EYE SURGERY     Lasik    LYMPH NODE DISSECTION Left 08/01/2015   Procedure: LYMPH NODE DISSECTION;  Surgeon: Melrose Nakayama, MD;  Location: [facility];  Service: Thoracic;  Laterality: Left;   PARTIAL NEPHRECTOMY     right   SEGMENTECOMY Left 08/01/2015   Procedure: Left Upper Lobe SEGMENTECTOMY;  Surgeon: Melrose Nakayama, MD;  Location: [place];  Service: Thoracic;  Laterality: Left;   SKIN BIOPSY     TUBAL LIGATION     TUMOR REMOVAL     meningioma  T 12   VAGINECTOMY, PARTIAL N/A 08/11/2019   Procedure: TOTAL VAGINECTOMY;  Surgeon: Everitt Amber, MD;  Location: [facility];  Service: Gynecology;  Laterality: N/A;   VAGINECTOMY, PARTIAL N/A 03/20/2021   Procedure: vaginal excision;  Surgeon: [name], MD;  Location: [facility];  Service: Gynecology;  Laterality: N/A;   VIDEO ASSISTED THORACOSCOPY (VATS)/WEDGE RESECTION Left 08/01/2015   Procedure: VIDEO ASSISTED THORACOSCOPY (VATS)/WEDGE RESECTION;  Surgeon: Melrose Nakayama, MD;  Location: [facility];  Service: Thoracic;  Laterality: Left;   VULVA Milagros Loll BIOPSY N/A 02/14/2021   Procedure: VAGINAL AND VULVAR BIOPSY;  Surgeon: [name], MD;  Location: [facility];  Service: Gynecology;  Laterality: N/A;   VULVA SURGERY     x 2, 1 at [facility] (2016), and [facility]     Family History  Problem Relation Age of Onset   Hypertension Other    Diabetes Other    Stroke Other    CAD Other    Colon cancer Neg Hx    Breast cancer Neg Hx    Ovarian cancer Neg Hx    Endometrial cancer Neg  Hx    Pancreatic cancer Neg Hx    Prostate cancer Neg Hx     Social History   Socioeconomic History   Marital status: Divorced    Spouse name: Not on file   Number of children: Not on file   Years of education: Not on file   Highest education level: Not on file  Occupational History   Not on file  Tobacco Use   Smoking status: Never   Smokeless tobacco: Never  Vaping Use   Vaping Use: Never used  Substance and Sexual Activity   Alcohol use: No   Drug use: No   Sexual activity: Never  Other Topics Concern   Not on file  Social History Narrative  Lives at home alone, works out at [employer] 6 days a week, still very active. Has an ex-husband who she is in contact with. Son died of an MI and another son in [place].    Social Determinants of Health   Financial Resource Strain: Not on file  Food Insecurity: Not on file  Transportation Needs: Not on file  Physical Activity: Not on file  Stress: Not on file  Social Connections: Not on file    Current Medications:  Current Outpatient Medications:    acyclovir (ZOVIRAX) 800 MG tablet, Take 800 mg by mouth daily as needed (cold sores)., Disp: , Rfl:    albuterol (VENTOLIN HFA) 108 (90 Base) MCG/ACT inhaler, Inhale 2 puffs into the lungs every 6 (six) hours as needed for wheezing or shortness of breath., Disp: , Rfl:    amLODipine (NORVASC) 5 MG tablet, Take 5 mg by mouth every evening., Disp: , Rfl:    Ascorbic Acid (VITAMIN C PO), Take 1 tablet by mouth daily., Disp: , Rfl:    benazepril-hydrochlorthiazide (LOTENSIN HCT) 20-25 MG tablet, Take 1 tablet by mouth daily. , Disp: , Rfl:    BIOTIN PO, Take 1 tablet by mouth daily., Disp: , Rfl:    Cholecalciferol (VITAMIN D3 PO), Take 1 tablet by mouth in the morning and at bedtime., Disp: , Rfl:    clobetasol cream (TEMOVATE) 0.05 %, Use fingertip size amount of cream on your finger and apply slightly past the opening of the vagina three times a week at night, Disp: 30 g, Rfl: 4    docusate sodium (COLACE) 100 MG capsule, Take 100 mg by mouth 2 (two) times daily., Disp: , Rfl:    estradiol (ESTRACE VAGINAL) 0.1 MG/GM vaginal cream, Use fingertip size amount of cream on your finger and apply slightly past the opening of the vagina three times a week at night, Disp: 42.5 g, Rfl: 6   furosemide (LASIX) 20 MG tablet, Take 20 mg by mouth daily as needed for fluid., Disp: , Rfl:    levothyroxine (SYNTHROID, LEVOTHROID) 75 MCG tablet, Take 75 mcg by mouth daily., Disp: , Rfl:    MAGNESIUM PO, Take 1 tablet by mouth every evening., Disp: , Rfl:    Multiple Vitamin (MULTIVITAMIN WITH MINERALS) TABS tablet, Take 1 tablet by mouth daily., Disp: , Rfl:    Omega-3 Fatty Acids (FISH OIL PO), Take 1 capsule by mouth daily., Disp: , Rfl:    polyethylene glycol (MIRALAX / GLYCOLAX) packet, Take 11.3333 g by mouth at bedtime. 2/3 capful, Disp: , Rfl:    simvastatin (ZOCOR) 20 MG tablet, Take 20 mg by mouth at bedtime. , Disp: , Rfl:    TURMERIC PO, Take 1 capsule by mouth every evening., Disp: , Rfl:   Review of Systems: + anxiety Denies appetite changes, fevers, chills, fatigue, unexplained weight changes. Denies hearing loss, neck lumps or masses, mouth sores, ringing in ears or voice changes. Denies cough or wheezing.  Denies shortness of breath. Denies chest pain or palpitations. Denies leg swelling. Denies abdominal distention, pain, blood in stools, constipation, diarrhea, nausea, vomiting, or early satiety. Denies pain with intercourse, dysuria, frequency, hematuria or incontinence. Denies hot flashes, pelvic pain, vaginal bleeding or vaginal discharge.   Denies joint pain, back pain or muscle pain/cramps. Denies itching, rash, or wounds. Denies dizziness, headaches, numbness or seizures. Denies swollen lymph nodes or glands, denies easy bruising or bleeding. Denies depression, confusion, or decreased concentration.  Physical Exam: BP (!) 140/80 (BP Location:  Right Arm,  Patient Position: Sitting) Comment: Taken manual  MD notified  Pulse 72   Temp 98.7 F (37.1 C) (Oral)   Resp 16   Ht 5\' 5"  (1.651 m)   BMI 24.96 kg/m  General: Alert, oriented, no acute distress. HEENT: Normocephalic, atraumatic, sclera anicteric. Chest: Unlabored breathing on room air. Extremities: Grossly normal range of motion.  Warm, well perfused.  No edema bilaterally. Skin: No rashes or lesions noted. Lymphatics: No cervical, supraclavicular, or inguinal adenopathy. GU: Loss of architecture of the vulva.  Atrophic, mildly erythematous skin on bilateral posterior vulva as well as anterior vulva consistent with lichen sclerosis.  More pronounced areas of thinning along the anterior vagina today.  After application of acetic acid, vaginoscopy and vulvoscopy performed.  Areas of leukoplakia and some acetowhite noted along the posterior vagina and posterior fourchette.  Along the posterior vagina, some small areas with punctations and mildly atypical vasculature.  Findings most consistent with low-grade dysplasia versus radiation changes.  Laboratory & Radiologic Studies: None new  Assessment & Plan: [name] Richards is a 76 y.o. woman with recurrent vaginal cancer as well as high-grade vulvar dysplasia status post partial posterior vaginectomy and laser ablation of the vulva on 03/20/21. Now using alternating topical estrogen and clobetasol.  Patient is overall doing well.  On exam today, performed with acetic acid, she has some findings along the posterior aspect of the vagina that are suspicious for dysplasia.  Plan at this time to monitor closely.  Depending on her exam findings at her next visit, I may recommend biopsies under anesthesia with plan for concurrent treatment at that time to save her multiple trips to the operating room.  Patient and I have previously discussed her frustration with treatment.  She voices again today that she may ultimately decide not to pursue any additional  therapy (such as biopsies, laser treatment, excision).  I will see her back in 3 months for surveillance visit.  We discussed symptoms that should prompt a phone call to see me sooner.  22 minutes of total time was spent for this patient encounter, including preparation, face-to-face counseling with the patient and coordination of care, and documentation of the encounter.  Jeral Pinch, MD  Division of Gynecologic Oncology  Department of Obstetrics and Gynecology  [facility]

## 2021-12-16 DIAGNOSIS — M1712 Unilateral primary osteoarthritis, left knee: Secondary | ICD-10-CM | POA: Diagnosis not present

## 2021-12-19 DIAGNOSIS — H40003 Preglaucoma, unspecified, bilateral: Secondary | ICD-10-CM | POA: Diagnosis not present

## 2021-12-26 DIAGNOSIS — M1712 Unilateral primary osteoarthritis, left knee: Secondary | ICD-10-CM | POA: Diagnosis not present

## 2022-01-09 DIAGNOSIS — N3000 Acute cystitis without hematuria: Secondary | ICD-10-CM | POA: Diagnosis not present

## 2022-01-15 DIAGNOSIS — Z85118 Personal history of other malignant neoplasm of bronchus and lung: Secondary | ICD-10-CM | POA: Diagnosis not present

## 2022-01-15 DIAGNOSIS — H8110 Benign paroxysmal vertigo, unspecified ear: Secondary | ICD-10-CM | POA: Diagnosis not present

## 2022-01-15 DIAGNOSIS — I2510 Atherosclerotic heart disease of native coronary artery without angina pectoris: Secondary | ICD-10-CM | POA: Diagnosis not present

## 2022-01-15 DIAGNOSIS — E039 Hypothyroidism, unspecified: Secondary | ICD-10-CM | POA: Diagnosis not present

## 2022-01-15 DIAGNOSIS — E785 Hyperlipidemia, unspecified: Secondary | ICD-10-CM | POA: Diagnosis not present

## 2022-01-15 DIAGNOSIS — E871 Hypo-osmolality and hyponatremia: Secondary | ICD-10-CM | POA: Diagnosis not present

## 2022-01-15 DIAGNOSIS — N3090 Cystitis, unspecified without hematuria: Secondary | ICD-10-CM | POA: Diagnosis not present

## 2022-01-15 DIAGNOSIS — I10 Essential (primary) hypertension: Secondary | ICD-10-CM | POA: Diagnosis not present

## 2022-01-15 DIAGNOSIS — J45991 Cough variant asthma: Secondary | ICD-10-CM | POA: Diagnosis not present

## 2022-01-15 DIAGNOSIS — I700 Atherosclerosis of aorta: Secondary | ICD-10-CM | POA: Diagnosis not present

## 2022-01-31 ENCOUNTER — Inpatient Hospital Stay: Payer: Medicare Other | Attending: Gynecologic Oncology | Admitting: Gynecologic Oncology

## 2022-01-31 ENCOUNTER — Other Ambulatory Visit: Payer: Self-pay | Admitting: Gynecologic Oncology

## 2022-01-31 ENCOUNTER — Encounter: Payer: Self-pay | Admitting: Gynecologic Oncology

## 2022-01-31 VITALS — BP 167/78 | HR 65 | Temp 99.0°F | Resp 16 | Wt 156.2 lb

## 2022-01-31 DIAGNOSIS — Z923 Personal history of irradiation: Secondary | ICD-10-CM | POA: Diagnosis not present

## 2022-01-31 DIAGNOSIS — Z7189 Other specified counseling: Secondary | ICD-10-CM

## 2022-01-31 DIAGNOSIS — Z8541 Personal history of malignant neoplasm of cervix uteri: Secondary | ICD-10-CM | POA: Insufficient documentation

## 2022-01-31 DIAGNOSIS — Z90710 Acquired absence of both cervix and uterus: Secondary | ICD-10-CM | POA: Insufficient documentation

## 2022-01-31 DIAGNOSIS — Z8542 Personal history of malignant neoplasm of other parts of uterus: Secondary | ICD-10-CM | POA: Insufficient documentation

## 2022-01-31 DIAGNOSIS — N904 Leukoplakia of vulva: Secondary | ICD-10-CM | POA: Insufficient documentation

## 2022-01-31 DIAGNOSIS — C52 Malignant neoplasm of vagina: Secondary | ICD-10-CM

## 2022-01-31 DIAGNOSIS — Z90722 Acquired absence of ovaries, bilateral: Secondary | ICD-10-CM | POA: Insufficient documentation

## 2022-01-31 DIAGNOSIS — L900 Lichen sclerosus et atrophicus: Secondary | ICD-10-CM

## 2022-01-31 DIAGNOSIS — D071 Carcinoma in situ of vulva: Secondary | ICD-10-CM

## 2022-01-31 DIAGNOSIS — C519 Malignant neoplasm of vulva, unspecified: Secondary | ICD-10-CM

## 2022-01-31 MED ORDER — TRAMADOL HCL 50 MG PO TABS: 50.0000 mg | ORAL_TABLET | Freq: Four times a day (QID) | ORAL | 0 refills | Status: DC | PRN

## 2022-01-31 NOTE — Patient Instructions (Signed)
Preparing for your Surgery  Plan for surgery on February 19, 2022 with Dr. Jeral Pinch at [facility] based on availability. You will be scheduled for examination under anesthesia, vulvar lesion excision, possible biopsy, possible laser.   Pre-operative Testing -You will receive a phone call from presurgical testing at [facility] to discuss surgery instructions and arrange for lab work if needed.  -Bring your insurance card, copy of an advanced directive if applicable, medication list.  -You should not be taking blood thinners or aspirin at least ten days prior to surgery unless instructed by your surgeon.  -Do not take supplements such as fish oil (omega 3), red yeast rice, turmeric before your surgery. You want to avoid medications with aspirin in them including headache powders such as BC or Goody's), Excedrin migraine.  Day Before Surgery at [place] will be advised you can have clear liquids up until 3 hours before your surgery.    Your role in recovery Your role is to become active as soon as directed by your doctor, while still giving yourself time to heal.  Rest when you feel tired. You will be asked to do the following in order to speed your recovery:  - Cough and breathe deeply. This helps to clear and expand your lungs and can prevent pneumonia after surgery.  - [place]. Do mild physical activity. Walking or moving your legs help your circulation and body functions return to normal. Do not try to get up or walk alone the first time after surgery.   -If you develop swelling on one leg or the other, pain in the back of your leg, redness/warmth in one of your legs, please call the office or go to the Emergency Room to have a doppler to rule out a blood clot. For shortness of breath, chest pain-seek care in the Emergency Room as soon as possible. - Actively manage your pain. Managing your pain lets you move  in comfort. We will ask you to rate your pain on a scale of zero to 10. It is your responsibility to tell your doctor or nurse where and how much you hurt so your pain can be treated.  Special Considerations -Your final pathology results from surgery should be available around one week after surgery and the results will be relayed to you when available.  -FMLA forms can be faxed to [phone] and please allow 5-7 business days for completion.  Pain Management After Surgery -Make sure that you have Tylenol and Ibuprofen at home IF [place] to use on a regular basis after surgery for pain control. We recommend alternating the medications every hour to six hours since they work differently and are processed in the body differently for pain relief.  -Review the attached handout on narcotic use and their risks and side effects.   Bowel Regimen -It is important to prevent constipation and drink adequate amounts of liquids.   Risks of Surgery Risks of surgery are low but include bleeding, infection, damage to surrounding structures, re-operation, blood clots, and very rarely death.  AFTER SURGERY INSTRUCTIONS  Return to work:  variable  We recommend purchasing several bags of frozen green peas and dividing them into ziploc bags. You will want to keep these in the freezer and have them ready to use as ice packs to the vulvar incision. Once the ice pack is no longer cold, you can get  another from the freezer. The frozen peas mold to your body better than a regular ice pack.   Activity: 1. Be up and out of the bed during the day.  Take a nap if needed.  You may walk up steps but be careful and use the hand rail.  Stair climbing will tire you more than you think, you may need to stop part way and rest.   2. No lifting or straining for 2 weeks over 10 pounds. No pushing, pulling, straining for 2 weeks.  3. No driving for minimum 24 hours after surgery.  Do not drive  if you are taking narcotic pain medicine and make sure that your reaction time has returned.   4. You can shower as soon as the next day after surgery. Shower daily. No tub baths or submerging your body in water until cleared by your surgeon. If you have the soap that was given to you by pre-surgical testing that was used before surgery, you do not need to use it afterwards because this can irritate your incisions.   5. No sexual activity and nothing in the vagina for 4 weeks.  6. You may experience vaginal spotting and discharge after surgery.  The spotting is normal but if you experience heavy bleeding, call our office.  7. Take Tylenol or ibuprofen for pain if you are able to take these medications.  Monitor your Tylenol intake to a max of 4,000 mg in a 24 hour period. You can alternate these medications after surgery.  Diet: 1. Low sodium Heart Healthy Diet is recommended but you are cleared to resume your normal (before surgery) diet after your procedure.  2. It is safe to use a laxative, such as Miralax or Colace, if you have difficulty moving your bowels.   Wound Care: 1. Keep clean and dry.  Shower daily.  Reasons to call the Doctor: Fever - Oral temperature greater than 100.4 degrees Fahrenheit Foul-smelling vaginal discharge Difficulty urinating Nausea and vomiting Increased pain at the site of the incision that is unrelieved with pain medicine. Difficulty breathing with or without chest pain New calf pain especially if only on one side Sudden, continuing increased vaginal bleeding with or without clots.   Contacts: For questions or concerns you should contact:  Dr. Jeral Pinch at [phone]  Joylene John, NP at [phone]  After Hours: call [phone] and have the GYN Oncologist paged/contacted (after 5 pm or on the weekends).  Messages sent via mychart are for non-urgent matters and are not responded to after hours so for urgent needs, please call the  after hours number.

## 2022-01-31 NOTE — Telephone Encounter (Signed)
[name] and let her know that she can hold off using the estrogen and clobetasol creams until her procedure and that we will send pain medication for post op to her pharmacy.  She verbalized understanding and agreement.

## 2022-01-31 NOTE — Progress Notes (Signed)
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   Treatment History: Oncology History Overview Note  Patient followed due to HX pulmonary nodules noted 2011  Primary cancer of left upper lobe of lung ([place])   Staging form: Lung, AJCC 7th Edition   - Clinical stage from 09/27/2015: Stage IA (T1b, N0, M0) - Signed by Curt Bears, MD on 09/27/2015    Primary cancer of left upper lobe of lung ([place])  07/12/2015 Imaging   CT Chest IMPRESSION:  The solid component of the large part solid nodule in the left upper lobe measures 9 x 3 mm (mean diameter 6 mm.) persistent part solid nodules with solid components greater than or equal to 6 mm should be considered highly suspicious for pulmonary adenocarcinoma   08/01/2015 Initial Diagnosis   Primary cancer of left upper lobe of lung ([place])   08/01/2015 Surgery   PROCEDURE:  Left video-assisted thoracoscopy,           Wedge resection,           Thoracoscopic lingular sparing left upper lobectomy           Lymph node dissection           On-Q local anesthetic catheter placement   08/01/2015 Pathology Results   Lung, resection (segmental or lobe), Left Upper Lobe - ADENOCARCINOMA, WELL DIFFERENTIATED, SPANNING 2.5 CM. - THE SURGICAL RESECTION MARGINS ARE NEGATIVE FOR CARCINOMA. 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 She underwent a total abdominal hysterectomy and bilateral salpingo-oophorectomy, pelvic and para-aortic lymphadenectomy. She received postoperative whole pelvis radiation therapy.  Her other primary gynecologic problem has been lichen sclerosus managed with when necessary clobetasol.   Beginning in 2013 the patient had intermittent episodes of partial small bowel obstruction most likely secondary to  radiation injury to the terminal ileum.   In December 2016 the patient was found to have a new primary squamous cell carcinoma the vulva undergoing a modified radical vulvectomy on 02/06/2015. She underwent a modified radical vulvectomy at [facility] on 02/06/2015. She was found to have a 3 cm vulvar lesion of the posterior vulva extending into the posterior vagina. A rhomboid flap was required to close the incision.  Final pathology showed some close surgical margins. However, the specimen margins were toward the time of surgery and it was Dr Mora Bellman impression that they were widely around the primary cancer. She discussed options with the patient and they agreed to monitor her closely but not recommend any radiation therapy to the vulva.   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  Depth of invasion was 4 mm all margins were negative.  Follow-up PET scan March 23, 2017 showed no evidence of metastatic disease.   Examination of the vulva during routine surveillance on 07/20/19 showed a friable, verrucous lesion measuring approximately 2 cm was seen on the anterior wall of the suburethral vagina extending to the introitus and wrapping around to the right mid vagina. This was concerning for recurrence and was biopsied at that appointment. 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    On 08/11/19 she underwent total vaginectomy. Intraoperative findings were significant for a forshortened 3cm vagina, friable 4cm plaque on the anterior  vagina extending to the right fornix and left posterior vaginal wall. This necessitated a total vaginectomy to resect the lesion grossly. Her tissues were extremely friable due to age, prior radiation, menopause and lichen sclerosis. There was unavoidable fragmentation of the tissues during the resection. All gross visible disease was removed. Surgery was uncomplicated, and she  reported no postop pain.  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 The suspicious foci are present at the edges of tissue fragments.  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    Post-op PET scan in August, 2021 showed no metastatic disease.   The patient received adjuvant vaginal brachytherapy between 09/29/2019 through 10/12/2019.  She tolerated treatment very well with no toxicities.   I saw the patient on 12/28.  At the time of her visit, I was concerned for dysplastic changes versus radiation treatment within the vagina and on her vulva.   On 02/14/2021, the patient underwent exam under anesthesia, vaginal and vulvar biopsies.  Findings were notable for anatomic changes related to prior surgical treatment and radiation.  Significant atrophy was noted.  Multiple spots of hyperkeratosis versus acetowhite after application of acetic acid were found, all biopsied.  Final pathology revealed invasive squamous cell carcinoma at 5:00 of the vaginal biopsy.  Multiple other biopsies including a 1:00 periclitoral biopsy and apex of the vagina showed low-grade squamous intraepithelial lesion.  Biopsy from 7:00 within the vagina showed high-grade squamous intraepithelial lesion. Submitted biopsies are noted to be very small, superficial, and oriented.  Most of them have dysplasia or carcinoma extended to the lateral edges but without involvement of the base of the biopsy.  The one biopsy showing carcinoma is 1 mm thick and described as extending to the edge of the biopsy where the lesion is transected and therefore measurement likely does not accurately represent the final thickness.   PET scan was performed on 02/28/2021.  There was noted to be interval decrease in degree of  FDG uptake within the vaginal introitus compared to PET scan in 2021 after her last surgery.  No concerning findings for metastatic disease.  Comment was noted that there is mild uptake within a left inguinal lymph node but the morphology is benign.  Imaging was reviewed at our tumor conference on 1/23 with discussion being findings did not represent metastatic disease.   03/20/21: Vaginectomy, CO2 laser of the vulva for recurrent vaginal cancer, high-grade vulvar dysplasia.  Vaginal excision showed squamous mucosa with ulceration and inflammation.  No malignancy or dysplasia identified.  Rare atypical "radiation" fibroblast embedded.  Interval History: Doing well.  Continues to have some vulvar/vaginal itching, overall less.  Is continuing to alternate clobetasol once a week for a month followed by estrogen twice a week for a month.  Denies any pain, bleeding, or discharge.  Reports baseline bowel bladder function.  Has noticed a lump on the right side, is unsure if this is new.  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 S/p TAH and radiation x6 wks    Complication of anesthesia    took 2 days to wake up after 1 surgery at 20, pt can't use a adult cath,needs  peds. cath due to past injury   Constipation    very worried about this   Endometrial cancer [facility]) 1997   coincidental endometrial cancer also   History of radiation therapy 10/12/2019   [facility] HDR brachytherapy  09/29/2019-10/12/2019  Dr Gery Pray   Hyperlipemia    Hypertension    Hypothyroidism    Primary cancer of left upper lobe of lung ([place]) 08/01/2015   Small bowel obstruction ([place])    12/2008, repeat SBO in 02/2011 also with terminal ilietis    Thyroid disease    Vertebral body hemangioma    T12    Past Surgical History:  Procedure Laterality Date    ABDOMINAL HYSTERECTOMY  Feb 1997   TAH/BSO, pelvic and periaortic lymphadenectomy    BACK SURGERY     CO2 LASER APPLICATION N/A 03/20/7865   Procedure: CO2 LASER APPLICATION to vagina and vuvla;  Surgeon: [name], MD;  Location: [facility];  Service: Gynecology;  Laterality: N/A;   EYE SURGERY     Lasik    LYMPH NODE DISSECTION Left 08/01/2015   Procedure: LYMPH NODE DISSECTION;  Surgeon: Melrose Nakayama, MD;  Location: [place];  Service: Thoracic;  Laterality: Left;   PARTIAL NEPHRECTOMY     right   SEGMENTECOMY Left 08/01/2015   Procedure: Left Upper Lobe SEGMENTECTOMY;  Surgeon: Melrose Nakayama, MD;  Location: [place];  Service: Thoracic;  Laterality: Left;   SKIN BIOPSY     TUBAL LIGATION     TUMOR REMOVAL     meningioma  T 12   VAGINECTOMY, PARTIAL N/A 08/11/2019   Procedure: TOTAL VAGINECTOMY;  Surgeon: Everitt Amber, MD;  Location: [facility];  Service: Gynecology;  Laterality: N/A;   VAGINECTOMY, PARTIAL N/A 03/20/2021   Procedure: vaginal excision;  Surgeon: [name], MD;  Location: [facility];  Service: Gynecology;  Laterality: N/A;   VIDEO ASSISTED THORACOSCOPY (VATS)/WEDGE RESECTION Left 08/01/2015   Procedure: VIDEO ASSISTED THORACOSCOPY (VATS)/WEDGE RESECTION;  Surgeon: Melrose Nakayama, MD;  Location: [place];  Service: Thoracic;  Laterality: Left;   VULVA Milagros Loll BIOPSY N/A 02/14/2021   Procedure: VAGINAL AND VULVAR BIOPSY;  Surgeon: [name], MD;  Location: [facility];  Service: Gynecology;  Laterality: N/A;   VULVA SURGERY     x 2, 1 at [facility] (2016), and [facility]     Family History  Problem Relation Age of Onset   Hypertension Other    Diabetes Other    Stroke Other    CAD Other    Colon cancer Neg Hx    Breast cancer Neg Hx    Ovarian cancer Neg Hx    Endometrial cancer Neg Hx    Pancreatic cancer Neg Hx    Prostate cancer Neg Hx     Social History   Socioeconomic  History   Marital status: Divorced    Spouse name: Not on file   Number of children: Not on file   Years of education: Not on file   Highest education level: Not on file  Occupational History   Not on file  Tobacco Use   Smoking status: Never   Smokeless tobacco: Never  Vaping Use   Vaping Use: Never used  Substance and Sexual Activity   Alcohol use: No   Drug use: No   Sexual activity: Never  Other Topics Concern   Not on file  Social History Narrative   Lives at home alone, works out  at the [facility] [employer] 6 days a week, still very active. Has an ex-husband who she is in contact with. Son died of an MI and another son in [place].    Social Determinants of Health   Financial Resource Strain: Not on file  Food Insecurity: Not on file  Transportation Needs: Not on file  Physical Activity: Not on file  Stress: Not on file  Social Connections: Not on file    Current Medications:  Current Outpatient Medications:    acyclovir (ZOVIRAX) 800 MG tablet, Take 800 mg by mouth daily as needed (cold sores)., Disp: , Rfl:    albuterol (VENTOLIN HFA) 108 (90 Base) MCG/ACT inhaler, Inhale 2 puffs into the lungs every 6 (six) hours as needed for wheezing or shortness of breath., Disp: , Rfl:    amLODipine (NORVASC) 5 MG tablet, Take 5 mg by mouth every evening., Disp: , Rfl:    Ascorbic Acid (VITAMIN C PO), Take 1 tablet by mouth daily., Disp: , Rfl:    benazepril-hydrochlorthiazide (LOTENSIN HCT) 20-25 MG tablet, Take 1 tablet by mouth daily. , Disp: , Rfl:    BIOTIN PO, Take 1 tablet by mouth daily., Disp: , Rfl:    Cholecalciferol (VITAMIN D3 PO), Take 1 tablet by mouth in the morning and at bedtime., Disp: , Rfl:    clobetasol cream (TEMOVATE) 0.05 %, Use fingertip size amount of cream on your finger and apply slightly past the opening of the vagina three times a week at night, Disp: 30 g, Rfl: 4   docusate sodium (COLACE) 100 MG capsule, Take 100 mg by mouth 2 (two) times daily., Disp: , Rfl:     estradiol (ESTRACE VAGINAL) 0.1 MG/GM vaginal cream, Use fingertip size amount of cream on your finger and apply slightly past the opening of the vagina three times a week at night, Disp: 42.5 g, Rfl: 6   levothyroxine (SYNTHROID, LEVOTHROID) 75 MCG tablet, Take 75 mcg by mouth daily., Disp: , Rfl:    MAGNESIUM PO, Take 1 tablet by mouth every evening., Disp: , Rfl:    Multiple Vitamin (MULTIVITAMIN WITH MINERALS) TABS tablet, Take 1 tablet by mouth daily., Disp: , Rfl:    Omega-3 Fatty Acids (FISH OIL PO), Take 1 capsule by mouth daily., Disp: , Rfl:    polyethylene glycol (MIRALAX / GLYCOLAX) packet, Take 11.3333 g by mouth at bedtime. 2/3 capful, Disp: , Rfl:    simvastatin (ZOCOR) 20 MG tablet, Take 20 mg by mouth at bedtime. , Disp: , Rfl:    TURMERIC PO, Take 1 capsule by mouth every evening., Disp: , Rfl:   Review of Systems: Denies appetite changes, fevers, chills, fatigue, unexplained weight changes. Denies hearing loss, neck lumps or masses, mouth sores, ringing in ears or voice changes. Denies cough or wheezing.  Denies shortness of breath. Denies chest pain or palpitations. Denies leg swelling. Denies abdominal distention, pain, blood in stools, constipation, diarrhea, nausea, vomiting, or early satiety. Denies pain with intercourse, dysuria, frequency, hematuria or incontinence. Denies hot flashes, pelvic pain, vaginal bleeding or vaginal discharge.   Denies joint pain, back pain or muscle pain/cramps. Denies itching, rash, or wounds. Denies dizziness, headaches, numbness or seizures. Denies swollen lymph nodes or glands, denies easy bruising or bleeding. Denies anxiety, depression, confusion, or decreased concentration.  Physical Exam: BP (!) 167/78 (BP Location: Left Arm, Patient Position: Sitting)   Pulse 65   Temp 99 F (37.2 C) (Oral)   Resp 16   Wt 156 lb 3.2 oz (  70.9 kg)   SpO2 100%   BMI 25.99 kg/m  General: Alert, oriented, no acute distress. HEENT:  Normocephalic, atraumatic, sclera anicteric. Chest: Clear to auscultation bilaterally.  No wheezes or rhonchi. Cardiovascular: Regular rate and rhythm, no murmurs. Extremities: Grossly normal range of motion.  Warm, well perfused.  No edema bilaterally. Skin: No rashes or lesions noted. Lymphatics: No cervical, supraclavicular, or inguinal adenopathy. GU: Loss of architecture of the vulva.  Atrophic, mildly erythematous skin on bilateral posterior vulva as well as anterior vulva consistent with lichen sclerosis.  Along the right, at the junction of the vagina and vulva, there is an approximately 2 cm raised, somewhat papillary firm lesion just lateral to the midline from 6-8 o'clock.  Visually, this is concerning for at least high-grade dysplasia.  Vaginoscopy not performed today.  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  Laboratory & Radiologic Studies: None new  Assessment & Plan: [name] Richards is a 76 y.o. woman with recurrent vaginal cancer as well as high-grade vulvar dysplasia status post partial posterior vaginectomy and laser ablation of the vulva on 03/20/21. Now using alternating topical estrogen and clobetasol. Vulvar lesion concerning for at least high-grade dysplasia, suspect malignant.  Reviewed exam findings with the patient.  Discussed my concern for at least high-grade dysplasia.  I am recommending 1 procedure for diagnosis and treatment.  I discussed options including biopsies with laser ablation, excision of the area with attempt at closure versus lasering the base, and excision of the area with skin flap.  The patient is very hesitant about treatment, and she has voiced to me before.  She has a longstanding relationship with Dr. Fermin Schwab.  I offered that I can reach out to him to discuss any additional thoughts he may have.  She was very interested in this but is amenable to scheduling surgery tentatively for early  January.  20 minutes of total time was spent for this patient encounter, including preparation, face-to-face counseling with the patient and coordination of care, and documentation of the encounter.  Jeral Pinch, MD  Division of Gynecologic Oncology  Department of Obstetrics and Gynecology  [facility] of [facility]

## 2022-01-31 NOTE — H&P (View-Only) (Signed)
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   Treatment History: Oncology History Overview Note  Patient followed due to HX pulmonary nodules noted 2011  Primary cancer of left upper lobe of lung ([place])   Staging form: Lung, AJCC 7th Edition   - Clinical stage from 09/27/2015: Stage IA (T1b, N0, M0) - Signed by Curt Bears, MD on 09/27/2015    Primary cancer of left upper lobe of lung ([place])  07/12/2015 Imaging   CT Chest IMPRESSION:  The solid component of the large part solid nodule in the left upper lobe measures 9 x 3 mm (mean diameter 6 mm.) persistent part solid nodules with solid components greater than or equal to 6 mm should be considered highly suspicious for pulmonary adenocarcinoma   08/01/2015 Initial Diagnosis   Primary cancer of left upper lobe of lung ([place])   08/01/2015 Surgery   PROCEDURE:  Left video-assisted thoracoscopy,           Wedge resection,           Thoracoscopic lingular sparing left upper lobectomy           Lymph node dissection           On-Q local anesthetic catheter placement   08/01/2015 Pathology Results   Lung, resection (segmental or lobe), Left Upper Lobe - ADENOCARCINOMA, WELL DIFFERENTIATED, SPANNING 2.5 CM. - THE SURGICAL RESECTION MARGINS ARE NEGATIVE FOR CARCINOMA. 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 She underwent a total abdominal hysterectomy and bilateral salpingo-oophorectomy, pelvic and para-aortic lymphadenectomy. She received postoperative whole pelvis radiation therapy.  Her other primary gynecologic problem has been lichen sclerosus managed with when necessary clobetasol.   Beginning in 2013 the patient had intermittent episodes of partial small bowel obstruction most likely secondary to  radiation injury to the terminal ileum.   In December 2016 the patient was found to have a new primary squamous cell carcinoma the vulva undergoing a modified radical vulvectomy on 02/06/2015. She underwent a modified radical vulvectomy at [facility] on 02/06/2015. She was found to have a 3 cm vulvar lesion of the posterior vulva extending into the posterior vagina. A rhomboid flap was required to close the incision.  Final pathology showed some close surgical margins. However, the specimen margins were toward the time of surgery and it was Dr Mora Bellman impression that they were widely around the primary cancer. She discussed options with the patient and they agreed to monitor her closely but not recommend any radiation therapy to the vulva.   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  Depth of invasion was 4 mm all margins were negative.  Follow-up PET scan March 23, 2017 showed no evidence of metastatic disease.   Examination of the vulva during routine surveillance on 07/20/19 showed a friable, verrucous lesion measuring approximately 2 cm was seen on the anterior wall of the suburethral vagina extending to the introitus and wrapping around to the right mid vagina. This was concerning for recurrence and was biopsied at that appointment. 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    On 08/11/19 she underwent total vaginectomy. Intraoperative findings were significant for a forshortened 3cm vagina, friable 4cm plaque on the anterior  vagina extending to the right fornix and left posterior vaginal wall. This necessitated a total vaginectomy to resect the lesion grossly. Her tissues were extremely friable due to age, prior radiation, menopause and lichen sclerosis. There was unavoidable fragmentation of the tissues during the resection. All gross visible disease was removed. Surgery was uncomplicated, and she  reported no postop pain.  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 The suspicious foci are present at the edges of tissue fragments.  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    Post-op PET scan in August, 2021 showed no metastatic disease.   The patient received adjuvant vaginal brachytherapy between 09/29/2019 through 10/12/2019.  She tolerated treatment very well with no toxicities.   I saw the patient on 12/28.  At the time of her visit, I was concerned for dysplastic changes versus radiation treatment within the vagina and on her vulva.   On 02/14/2021, the patient underwent exam under anesthesia, vaginal and vulvar biopsies.  Findings were notable for anatomic changes related to prior surgical treatment and radiation.  Significant atrophy was noted.  Multiple spots of hyperkeratosis versus acetowhite after application of acetic acid were found, all biopsied.  Final pathology revealed invasive squamous cell carcinoma at 5:00 of the vaginal biopsy.  Multiple other biopsies including a 1:00 periclitoral biopsy and apex of the vagina showed low-grade squamous intraepithelial lesion.  Biopsy from 7:00 within the vagina showed high-grade squamous intraepithelial lesion. Submitted biopsies are noted to be very small, superficial, and oriented.  Most of them have dysplasia or carcinoma extended to the lateral edges but without involvement of the base of the biopsy.  The one biopsy showing carcinoma is 1 mm thick and described as extending to the edge of the biopsy where the lesion is transected and therefore measurement likely does not accurately represent the final thickness.   PET scan was performed on 02/28/2021.  There was noted to be interval decrease in degree of  FDG uptake within the vaginal introitus compared to PET scan in 2021 after her last surgery.  No concerning findings for metastatic disease.  Comment was noted that there is mild uptake within a left inguinal lymph node but the morphology is benign.  Imaging was reviewed at our tumor conference on 1/23 with discussion being findings did not represent metastatic disease.   03/20/21: Vaginectomy, CO2 laser of the vulva for recurrent vaginal cancer, high-grade vulvar dysplasia.  Vaginal excision showed squamous mucosa with ulceration and inflammation.  No malignancy or dysplasia identified.  Rare atypical "radiation" fibroblast embedded.  Interval History: Doing well.  Continues to have some vulvar/vaginal itching, overall less.  Is continuing to alternate clobetasol once a week for a month followed by estrogen twice a week for a month.  Denies any pain, bleeding, or discharge.  Reports baseline bowel bladder function.  Has noticed a lump on the right side, is unsure if this is new.  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 S/p TAH and radiation x6 wks    Complication of anesthesia    took 2 days to wake up after 1 surgery at 20, pt can't use a adult cath,needs  peds. cath due to past injury   Constipation    very worried about this   Endometrial cancer [facility]) 1997   coincidental endometrial cancer also   History of radiation therapy 10/12/2019   [facility] HDR brachytherapy  09/29/2019-10/12/2019  Dr Gery Pray   Hyperlipemia    Hypertension    Hypothyroidism    Primary cancer of left upper lobe of lung ([place]) 08/01/2015   Small bowel obstruction ([place])    12/2008, repeat SBO in 02/2011 also with terminal ilietis    Thyroid disease    Vertebral body hemangioma    T12    Past Surgical History:  Procedure Laterality Date    ABDOMINAL HYSTERECTOMY  Feb 1997   TAH/BSO, pelvic and periaortic lymphadenectomy    BACK SURGERY     CO2 LASER APPLICATION N/A 05/17/6544   Procedure: CO2 LASER APPLICATION to vagina and vuvla;  Surgeon: [name], MD;  Location: [facility];  Service: Gynecology;  Laterality: N/A;   EYE SURGERY     Lasik    LYMPH NODE DISSECTION Left 08/01/2015   Procedure: LYMPH NODE DISSECTION;  Surgeon: Melrose Nakayama, MD;  Location: [place];  Service: Thoracic;  Laterality: Left;   PARTIAL NEPHRECTOMY     right   SEGMENTECOMY Left 08/01/2015   Procedure: Left Upper Lobe SEGMENTECTOMY;  Surgeon: Melrose Nakayama, MD;  Location: [place];  Service: Thoracic;  Laterality: Left;   SKIN BIOPSY     TUBAL LIGATION     TUMOR REMOVAL     meningioma  T 12   VAGINECTOMY, PARTIAL N/A 08/11/2019   Procedure: TOTAL VAGINECTOMY;  Surgeon: Everitt Amber, MD;  Location: [facility];  Service: Gynecology;  Laterality: N/A;   VAGINECTOMY, PARTIAL N/A 03/20/2021   Procedure: vaginal excision;  Surgeon: [name], MD;  Location: [facility];  Service: Gynecology;  Laterality: N/A;   VIDEO ASSISTED THORACOSCOPY (VATS)/WEDGE RESECTION Left 08/01/2015   Procedure: VIDEO ASSISTED THORACOSCOPY (VATS)/WEDGE RESECTION;  Surgeon: Melrose Nakayama, MD;  Location: [place];  Service: Thoracic;  Laterality: Left;   VULVA Milagros Loll BIOPSY N/A 02/14/2021   Procedure: VAGINAL AND VULVAR BIOPSY;  Surgeon: [name], MD;  Location: [facility];  Service: Gynecology;  Laterality: N/A;   VULVA SURGERY     x 2, 1 at [facility] (2016), and [facility]     Family History  Problem Relation Age of Onset   Hypertension Other    Diabetes Other    Stroke Other    CAD Other    Colon cancer Neg Hx    Breast cancer Neg Hx    Ovarian cancer Neg Hx    Endometrial cancer Neg Hx    Pancreatic cancer Neg Hx    Prostate cancer Neg Hx     Social History   Socioeconomic  History   Marital status: Divorced    Spouse name: Not on file   Number of children: Not on file   Years of education: Not on file   Highest education level: Not on file  Occupational History   Not on file  Tobacco Use   Smoking status: Never   Smokeless tobacco: Never  Vaping Use   Vaping Use: Never used  Substance and Sexual Activity   Alcohol use: No   Drug use: No   Sexual activity: Never  Other Topics Concern   Not on file  Social History Narrative   Lives at home alone, works out  at the [employer] 6 days a week, still very active. Has an ex-husband who she is in contact with. Son died of an MI and another son in [place].    Social Determinants of Health   Financial Resource Strain: Not on file  Food Insecurity: Not on file  Transportation Needs: Not on file  Physical Activity: Not on file  Stress: Not on file  Social Connections: Not on file    Current Medications:  Current Outpatient Medications:    acyclovir (ZOVIRAX) 800 MG tablet, Take 800 mg by mouth daily as needed (cold sores)., Disp: , Rfl:    albuterol (VENTOLIN HFA) 108 (90 Base) MCG/ACT inhaler, Inhale 2 puffs into the lungs every 6 (six) hours as needed for wheezing or shortness of breath., Disp: , Rfl:    amLODipine (NORVASC) 5 MG tablet, Take 5 mg by mouth every evening., Disp: , Rfl:    Ascorbic Acid (VITAMIN C PO), Take 1 tablet by mouth daily., Disp: , Rfl:    benazepril-hydrochlorthiazide (LOTENSIN HCT) 20-25 MG tablet, Take 1 tablet by mouth daily. , Disp: , Rfl:    BIOTIN PO, Take 1 tablet by mouth daily., Disp: , Rfl:    Cholecalciferol (VITAMIN D3 PO), Take 1 tablet by mouth in the morning and at bedtime., Disp: , Rfl:    clobetasol cream (TEMOVATE) 0.05 %, Use fingertip size amount of cream on your finger and apply slightly past the opening of the vagina three times a week at night, Disp: 30 g, Rfl: 4   docusate sodium (COLACE) 100 MG capsule, Take 100 mg by mouth 2 (two) times daily., Disp: , Rfl:     estradiol (ESTRACE VAGINAL) 0.1 MG/GM vaginal cream, Use fingertip size amount of cream on your finger and apply slightly past the opening of the vagina three times a week at night, Disp: 42.5 g, Rfl: 6   levothyroxine (SYNTHROID, LEVOTHROID) 75 MCG tablet, Take 75 mcg by mouth daily., Disp: , Rfl:    MAGNESIUM PO, Take 1 tablet by mouth every evening., Disp: , Rfl:    Multiple Vitamin (MULTIVITAMIN WITH MINERALS) TABS tablet, Take 1 tablet by mouth daily., Disp: , Rfl:    Omega-3 Fatty Acids (FISH OIL PO), Take 1 capsule by mouth daily., Disp: , Rfl:    polyethylene glycol (MIRALAX / GLYCOLAX) packet, Take 11.3333 g by mouth at bedtime. 2/3 capful, Disp: , Rfl:    simvastatin (ZOCOR) 20 MG tablet, Take 20 mg by mouth at bedtime. , Disp: , Rfl:    TURMERIC PO, Take 1 capsule by mouth every evening., Disp: , Rfl:   Review of Systems: Denies appetite changes, fevers, chills, fatigue, unexplained weight changes. Denies hearing loss, neck lumps or masses, mouth sores, ringing in ears or voice changes. Denies cough or wheezing.  Denies shortness of breath. Denies chest pain or palpitations. Denies leg swelling. Denies abdominal distention, pain, blood in stools, constipation, diarrhea, nausea, vomiting, or early satiety. Denies pain with intercourse, dysuria, frequency, hematuria or incontinence. Denies hot flashes, pelvic pain, vaginal bleeding or vaginal discharge.   Denies joint pain, back pain or muscle pain/cramps. Denies itching, rash, or wounds. Denies dizziness, headaches, numbness or seizures. Denies swollen lymph nodes or glands, denies easy bruising or bleeding. Denies anxiety, depression, confusion, or decreased concentration.  Physical Exam: BP (!) 167/78 (BP Location: Left Arm, Patient Position: Sitting)   Pulse 65   Temp 99 F (37.2 C) (Oral)   Resp 16   Wt 156 lb 3.2 oz (  70.9 kg)   SpO2 100%   BMI 25.99 kg/m  General: Alert, oriented, no acute distress. HEENT:  Normocephalic, atraumatic, sclera anicteric. Chest: Clear to auscultation bilaterally.  No wheezes or rhonchi. Cardiovascular: Regular rate and rhythm, no murmurs. Extremities: Grossly normal range of motion.  Warm, well perfused.  No edema bilaterally. Skin: No rashes or lesions noted. Lymphatics: No cervical, supraclavicular, or inguinal adenopathy. GU: Loss of architecture of the vulva.  Atrophic, mildly erythematous skin on bilateral posterior vulva as well as anterior vulva consistent with lichen sclerosis.  Along the right, at the junction of the vagina and vulva, there is an approximately 2 cm raised, somewhat papillary firm lesion just lateral to the midline from 6-8 o'clock.  Visually, this is concerning for at least high-grade dysplasia.  Vaginoscopy not performed today.  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  Laboratory & Radiologic Studies: None new  Assessment & Plan: [name] Richards is a 77 y.o. woman with recurrent vaginal cancer as well as high-grade vulvar dysplasia status post partial posterior vaginectomy and laser ablation of the vulva on 03/20/21. Now using alternating topical estrogen and clobetasol. Vulvar lesion concerning for at least high-grade dysplasia, suspect malignant.  Reviewed exam findings with the patient.  Discussed my concern for at least high-grade dysplasia.  I am recommending 1 procedure for diagnosis and treatment.  I discussed options including biopsies with laser ablation, excision of the area with attempt at closure versus lasering the base, and excision of the area with skin flap.  The patient is very hesitant about treatment, and she has voiced to me before.  She has a longstanding relationship with Dr. Fermin Schwab.  I offered that I can reach out to him to discuss any additional thoughts he may have.  She was very interested in this but is amenable to scheduling surgery tentatively for early  January.  20 minutes of total time was spent for this patient encounter, including preparation, face-to-face counseling with the patient and coordination of care, and documentation of the encounter.  Jeral Pinch, MD  Division of Gynecologic Oncology  Department of Obstetrics and Gynecology  [facility]

## 2022-02-04 ENCOUNTER — Encounter: Payer: Self-pay | Admitting: Gynecologic Oncology

## 2022-02-04 ENCOUNTER — Telehealth: Payer: Self-pay

## 2022-02-04 ENCOUNTER — Other Ambulatory Visit (HOSPITAL_BASED_OUTPATIENT_CLINIC_OR_DEPARTMENT_OTHER): Payer: Self-pay

## 2022-02-04 MED ORDER — COMIRNATY 30 MCG/0.3ML IM SUSY
PREFILLED_SYRINGE | INTRAMUSCULAR | 0 refills | Status: DC
  Filled 2022-02-04: qty 0.3, 1d supply, fill #0

## 2022-02-04 NOTE — Progress Notes (Signed)
COVID Vaccine received:  _0  No _1  Yes Date of any COVID positive Test in last 90 days:  PCP - Harlan Stains, MD Cardiologist -   Chest x-ray - 1 v  10-24-2021 EKG -  02-14-2021   will repeat at PST Stress Test -  ECHO -  Cardiac Cath -   PCR screen: _2  Ordered & Completed                      _3   No Order but Needs PROFEND                      _4   N/A for this surgery  Surgery Plan:  _5  Ambulatory                            _6  Outpatient in bed                            _7  Admit  Anesthesia:    _8  General  _9  Spinal                           _10   Choice _11   MAC  Bowel Prep - _12  No  _13   Yes _____________  Pacemaker / ICD device _14  No _15  Yes        Device order form faxed _16  No    _17   Yes      Faxed to:  Spinal Cord Stimulator:_18  No _19  Yes      (Remind patient to bring remote DOS) Other Implants:   History of Sleep Apnea? _20  No _21  Yes   CPAP used?- _22  No _23  Yes    Does the patient monitor blood sugar? _24  No _25  Yes  _26  N/A Does patient have a [employer] or Dexacom? _27  No _28  Yes   Fasting Blood Sugar Ranges-  Checks Blood Sugar _____ times a day  Last dose of GLP1 agonist-  GLP1 instructions:  Last dose of SGLT-2 inhibitors-  SGLT-2 instructions:   Blood Thinner / Instructions: Aspirin Instructions:  ERAS Protocol Ordered: _29  No  _30  Yes PRE-SURGERY _31  ENSURE  _32  G2  _33  No Drink Ordered  Patient is to be NPO after:   Comments:   Activity level: Patient can / can not climb a flight of stairs without difficulty; _34  No CP  _35  No SOB, but would have ______   Patient can / can not perform ADLs without assistance.   Anesthesia review:   Patient denies shortness of breath, fever, cough and chest pain at PAT appointment.  Patient verbalized understanding and agreement to the Pre-Surgical Instructions that were given to them at this PAT appointment. Patient was also educated of the need to review these PAT instructions again prior to his/her surgery.I reviewed  the appropriate phone numbers to call if they have any and questions or concerns.

## 2022-02-04 NOTE — Telephone Encounter (Signed)
Pt called office stating she just spoke with someone at the [facility] hospital. They stated her surgery for 02/19/22 is scheduled for 2:15pm.  She states she specifically requested early morning for transportation purposes. Her son lives in [place] and he has to work the next day.  She would like the surgery time changed to earlier in the day.   Pt aware Joylene John NP is out of the office today but I would send her a message for when she returns.

## 2022-02-05 NOTE — Progress Notes (Signed)
Requested orders for surgery, sent Joylene John, NP a msg.

## 2022-02-06 ENCOUNTER — Other Ambulatory Visit: Payer: Self-pay

## 2022-02-06 ENCOUNTER — Encounter (HOSPITAL_COMMUNITY): Payer: Self-pay

## 2022-02-06 ENCOUNTER — Encounter: Payer: Self-pay | Admitting: Gynecologic Oncology

## 2022-02-06 ENCOUNTER — Encounter (HOSPITAL_COMMUNITY)
Admission: RE | Admit: 2022-02-06 | Discharge: 2022-02-06 | Disposition: A | Discharge status: Home / Self Care | Payer: Medicare Other | Source: Ambulatory Visit | Attending: Gynecologic Oncology | Admitting: Gynecologic Oncology

## 2022-02-06 VITALS — BP 146/58 | HR 60 | Temp 99.3°F | Resp 16 | Ht 65.0 in | Wt 153.0 lb

## 2022-02-06 DIAGNOSIS — Z01818 Encounter for other preprocedural examination: Secondary | ICD-10-CM | POA: Insufficient documentation

## 2022-02-06 DIAGNOSIS — I10 Essential (primary) hypertension: Secondary | ICD-10-CM | POA: Diagnosis not present

## 2022-02-06 DIAGNOSIS — R001 Bradycardia, unspecified: Secondary | ICD-10-CM | POA: Insufficient documentation

## 2022-02-06 HISTORY — DX: Unspecified osteoarthritis, unspecified site: M19.90

## 2022-02-06 HISTORY — DX: Anemia, unspecified: D64.9

## 2022-02-06 LAB — CBC
HCT: 35.3 % — ABNORMAL LOW (ref 36.0–46.0)
Hemoglobin: 11.1 g/dL — ABNORMAL LOW (ref 12.0–15.0)
MCH: 21.5 pg — ABNORMAL LOW (ref 26.0–34.0)
MCHC: 31.4 g/dL (ref 30.0–36.0)
MCV: 68.3 fL — ABNORMAL LOW (ref 80.0–100.0)
Platelets: 226 10*3/uL (ref 150–400)
RBC: 5.17 MIL/uL — ABNORMAL HIGH (ref 3.87–5.11)
RDW: 14.6 % (ref 11.5–15.5)
WBC: 4.3 10*3/uL (ref 4.0–10.5)
nRBC: 0 % (ref 0.0–0.2)

## 2022-02-06 LAB — BASIC METABOLIC PANEL
Anion gap: 9 (ref 5–15)
BUN: 15 mg/dL (ref 8–23)
CO2: 27 mmol/L (ref 22–32)
Calcium: 9.2 mg/dL (ref 8.9–10.3)
Chloride: 96 mmol/L — ABNORMAL LOW (ref 98–111)
Creatinine, Ser: 0.57 mg/dL (ref 0.44–1.00)
GFR, Estimated: >60 mL/min (ref >60)
Glucose, Bld: 100 mg/dL — ABNORMAL HIGH (ref 70–99)
Potassium: 3.6 mmol/L (ref 3.5–5.1)
Sodium: 132 mmol/L — ABNORMAL LOW (ref 135–145)

## 2022-02-06 NOTE — Patient Instructions (Addendum)
SURGICAL WAITING ROOM VISITATION Patients having surgery or a procedure may have no more than 2 support people in the waiting area - these visitors may rotate in the visitor waiting room.   Due to an increase in RSV and influenza rates and associated hospitalizations, children ages 75 and under may not visit patients in [place]. If the patient needs to stay at the hospital during part of their recovery, the visitor guidelines for inpatient rooms apply.  PRE-OP VISITATION  Pre-op nurse will coordinate an appropriate time for 1 support person to accompany the patient in pre-op.  This support person may not rotate.  This visitor will be contacted when the time is appropriate for the visitor to come back in the pre-op area.  Please refer to the [facility] website for the visitor guidelines for Inpatients (after your surgery is over and you are in a regular room).  You are not required to quarantine at this time prior to your surgery. However, you must do this: Hand Hygiene often Do NOT share personal items Notify your provider if you are in close contact with someone who has COVID or you develop fever 100.4 or greater, new onset of sneezing, cough, sore throat, shortness of breath or body aches.  If you test positive for Covid or have been in contact with anyone that has tested positive in the last 10 days please notify you surgeon.    Your procedure is scheduled on:  Wednesday  February 19, 2022  Report to [facility] Main Entrance: [address] entrance where the [employer] is available.   Report to admitting at: 12:15 PM  +++++Call this number if you have any questions or problems the morning of surgery [phone]  Do not eat food after Midnight the night prior to your surgery/procedure.  After Midnight you may have the following liquids until  11:30  AM DAY OF SURGERY  Clear Liquid Diet Water Black Coffee (sugar ok, NO MILK/CREAM OR CREAMERS)  Tea (sugar ok, NO  MILK/CREAM OR CREAMERS) regular and decaf                             Plain Jell-O  with no fruit (NO RED)                                           Fruit ices (not with fruit pulp, NO RED)                                     Popsicles (NO RED)                                                                  Juice: apple, WHITE grape, WHITE cranberry Sports drinks like Gatorade or Powerade (NO RED)                FOLLOW ANY ADDITIONAL PRE OP INSTRUCTIONS YOU RECEIVED FROM YOUR SURGEON'S OFFICE!!!   Oral Hygiene is also important to reduce your risk of infection.  Remember - BRUSH YOUR TEETH THE MORNING OF SURGERY WITH YOUR REGULAR TOOTHPASTE   Take ONLY these medicines the morning of surgery with A SIP OF WATER: Levothyroxine (Synthroid)                    You may not have any metal on your body including hair pins, jewelry, and body piercing  Do not wear make-up, lotions, powders, perfumes or deodorant  Do not wear nail polish including gel and S&S, artificial / acrylic nails, or any other type of covering on natural nails including finger and toenails. If you have artificial nails, gel coating, etc., that needs to be removed by a nail salon, Please have this removed prior to surgery. Not doing so may mean that your surgery could be cancelled or delayed if the Surgeon or anesthesia staff feels like they are unable to monitor you safely.   Do not shave 48 hours prior to surgery to avoid nicks in your skin which may contribute to postoperative infections.   Patients discharged on the day of surgery will not be allowed to drive home.  Someone NEEDS to stay with you for the first 24 hours after anesthesia.  Do not bring your home medications to the hospital. The Pharmacy will dispense medications listed on your medication list to you during your admission in the Hospital.  Special Instructions: Bring a copy of your healthcare power of attorney and living will documents the day of  surgery, if you wish to have them scanned into your [facility] Records- EPIC  Please read over the following fact sheets you were given: IF YOU HAVE QUESTIONS ABOUT YOUR PRE-OP INSTRUCTIONS, PLEASE CALL [phone]  ([place])   [facility] - Preparing for Surgery Before surgery, you can play an important role.  Because skin is not sterile, your skin needs to be as free of germs as possible.  You can reduce the number of germs on your skin by washing with CHG (chlorahexidine gluconate) soap before surgery.  CHG is an antiseptic cleaner which kills germs and bonds with the skin to continue killing germs even after washing. Please DO NOT use if you have an allergy to CHG or antibacterial soaps.  If your skin becomes reddened/irritated stop using the CHG and inform your nurse when you arrive at Short Stay. Do not shave (including legs and underarms) for at least 48 hours prior to the first CHG shower.  You may shave your face/neck.  Please follow these instructions carefully:  1.  Shower with CHG Soap the night before surgery and the  morning of surgery.  2.  If you choose to wash your hair, wash your hair first as usual with your normal  shampoo.  3.  After you shampoo, rinse your hair and body thoroughly to remove the shampoo.                             4.  Use CHG as you would any other liquid soap.  You can apply chg directly to the skin and wash.  Gently with a scrungie or clean washcloth.  5.  Apply the CHG Soap to your body ONLY FROM THE NECK DOWN.   Do not use on face/ open                           Wound or open sores. Avoid contact with eyes, ears  mouth and genitals (private parts).                       Wash face,  Genitals (private parts) with your normal soap.             6.  Wash thoroughly, paying special attention to the area where your  surgery  will be performed.  7.  Thoroughly rinse your body with warm water from the neck down.  8.  DO NOT shower/wash with your normal soap  after using and rinsing off the CHG Soap.            9.  Pat yourself dry with a clean towel.            10.  Wear clean pajamas.            11.  Place clean sheets on your bed the night of your first shower and do not  sleep with pets.  ON THE DAY OF SURGERY : Do not apply any lotions/deodorants the morning of surgery.  Please wear clean clothes to the hospital/surgery center.    FAILURE TO FOLLOW THESE INSTRUCTIONS MAY RESULT IN THE CANCELLATION OF YOUR SURGERY  PATIENT SIGNATURE_________________________________  NURSE SIGNATURE__________________________________  ________________________________________________________________________

## 2022-02-07 NOTE — Telephone Encounter (Signed)
Patient returned call. New surgery time given. Patient verbalized understanding and had no further questions at this time.

## 2022-02-07 NOTE — Telephone Encounter (Signed)
Lvm for patient to call office regarding surgery start time. Per Joylene John NP, new start time for surgery on 02/19/22 is 8:30am, arrive 6:30am

## 2022-02-09 ENCOUNTER — Encounter: Payer: Self-pay | Admitting: Gynecologic Oncology

## 2022-02-18 ENCOUNTER — Telehealth: Payer: Self-pay

## 2022-02-18 NOTE — Anesthesia Preprocedure Evaluation (Signed)
Anesthesia Evaluation  Patient identified by MRN, date of birth, ID band Patient awake    Reviewed: Allergy & Precautions, NPO status , Patient's Chart, lab work & pertinent test results  History of Anesthesia Complications (+) PROLONGED EMERGENCE and history of anesthetic complications  Airway Mallampati: II  TM Distance: >3 FB Neck ROM: Full    Dental  (+) Teeth Intact, Dental Advisory Given   Pulmonary shortness of breath, asthma  Primary cancer of left upper lobe of lung   Pulmonary exam normal breath sounds clear to auscultation       Cardiovascular hypertension, Pt. on medications Normal cardiovascular exam Rhythm:Regular Rate:Normal     Neuro/Psych negative neurological ROS     GI/Hepatic negative GI ROS, Neg liver ROS,,,  Endo/Other  Hypothyroidism    Renal/GU Renal disease (s/p right partial nephrectomy )     Musculoskeletal negative musculoskeletal ROS (+) Arthritis ,    Abdominal   Peds  Hematology negative hematology ROS (+) Blood dyscrasia, anemia   Anesthesia Other Findings   Reproductive/Obstetrics recurrent vaginal cancer                             Anesthesia Physical Anesthesia Plan  ASA: 3  Anesthesia Plan: General   Post-op Pain Management: Tylenol PO (pre-op)   Induction: Intravenous  PONV Risk Score and Plan: 4 or greater and Dexamethasone, Ondansetron and Treatment may vary due to age or medical condition  Airway Management Planned: LMA  Additional Equipment: None  Intra-op Plan:   Post-operative Plan: Extubation in OR  Informed Consent: I have reviewed the patients History and Physical, chart, labs and discussed the procedure including the risks, benefits and alternatives for the proposed anesthesia with the patient or authorized representative who has indicated his/her understanding and acceptance.     Dental advisory given  Plan Discussed  with: CRNA and Anesthesiologist  Anesthesia Plan Comments:         Anesthesia Quick Evaluation

## 2022-02-18 NOTE — Telephone Encounter (Signed)
Telephone call to check on pre-operative status.  Patient compliant with pre-operative instructions.  Reinforced nothing to eat after midnight. Clear liquids until 0530. Patient to arrive at 0630.  No questions or concerns voiced.  Instructed to call for any needs.

## 2022-02-19 ENCOUNTER — Encounter (HOSPITAL_COMMUNITY)
Admission: RE | Disposition: A | Discharge status: Home / Self Care | Payer: Self-pay | Source: Ambulatory Visit | Attending: Gynecologic Oncology

## 2022-02-19 ENCOUNTER — Other Ambulatory Visit: Payer: Self-pay

## 2022-02-19 ENCOUNTER — Telehealth: Payer: Self-pay | Admitting: *Deleted

## 2022-02-19 ENCOUNTER — Ambulatory Visit (HOSPITAL_COMMUNITY)
Admission: RE | Admit: 2022-02-19 | Discharge: 2022-02-19 | Disposition: A | Discharge status: Home / Self Care | Payer: Medicare Other | Source: Ambulatory Visit | Attending: Gynecologic Oncology | Admitting: Gynecologic Oncology

## 2022-02-19 ENCOUNTER — Ambulatory Visit (HOSPITAL_COMMUNITY): Payer: Medicare Other | Admitting: Physician Assistant

## 2022-02-19 ENCOUNTER — Encounter (HOSPITAL_COMMUNITY): Payer: Self-pay | Admitting: Gynecologic Oncology

## 2022-02-19 ENCOUNTER — Ambulatory Visit (HOSPITAL_BASED_OUTPATIENT_CLINIC_OR_DEPARTMENT_OTHER): Payer: Medicare Other | Admitting: Physician Assistant

## 2022-02-19 DIAGNOSIS — I10 Essential (primary) hypertension: Secondary | ICD-10-CM | POA: Diagnosis not present

## 2022-02-19 DIAGNOSIS — Z905 Acquired absence of kidney: Secondary | ICD-10-CM | POA: Insufficient documentation

## 2022-02-19 DIAGNOSIS — E039 Hypothyroidism, unspecified: Secondary | ICD-10-CM | POA: Diagnosis not present

## 2022-02-19 DIAGNOSIS — N9089 Other specified noninflammatory disorders of vulva and perineum: Secondary | ICD-10-CM

## 2022-02-19 DIAGNOSIS — C519 Malignant neoplasm of vulva, unspecified: Secondary | ICD-10-CM

## 2022-02-19 DIAGNOSIS — J45909 Unspecified asthma, uncomplicated: Secondary | ICD-10-CM | POA: Insufficient documentation

## 2022-02-19 DIAGNOSIS — C3412 Malignant neoplasm of upper lobe, left bronchus or lung: Secondary | ICD-10-CM | POA: Insufficient documentation

## 2022-02-19 DIAGNOSIS — D638 Anemia in other chronic diseases classified elsewhere: Secondary | ICD-10-CM

## 2022-02-19 HISTORY — PX: VULVAR LESION REMOVAL: SHX5391

## 2022-02-19 SURGERY — EXAM UNDER ANESTHESIA
Anesthesia: General

## 2022-02-19 MED ORDER — EPHEDRINE 5 MG/ML INJ
INTRAVENOUS | Status: AC
  Filled 2022-02-19: qty 5

## 2022-02-19 MED ORDER — CLINDAMYCIN PHOSPHATE 900 MG/50ML IV SOLN
INTRAVENOUS | Status: DC | PRN
  Administered 2022-02-19: 900 mg via INTRAVENOUS

## 2022-02-19 MED ORDER — ACETAMINOPHEN 500 MG PO TABS
1000.0000 mg | ORAL_TABLET | ORAL | Status: AC
  Administered 2022-02-19: 1000 mg via ORAL
  Filled 2022-02-19: qty 2

## 2022-02-19 MED ORDER — PROPOFOL 500 MG/50ML IV EMUL
INTRAVENOUS | Status: AC
  Filled 2022-02-19: qty 50

## 2022-02-19 MED ORDER — ACETIC ACID 5 % SOLN
Status: AC
  Filled 2022-02-19: qty 50

## 2022-02-19 MED ORDER — LIDOCAINE HCL (CARDIAC) PF 100 MG/5ML IV SOSY
PREFILLED_SYRINGE | INTRAVENOUS | Status: DC | PRN
  Administered 2022-02-19: 30 mg via INTRAVENOUS

## 2022-02-19 MED ORDER — ACETAMINOPHEN 500 MG PO TABS: 1000.0000 mg | ORAL_TABLET | Freq: Once | ORAL | Status: DC

## 2022-02-19 MED ORDER — 0.9 % SODIUM CHLORIDE (POUR BTL) OPTIME
TOPICAL | Status: DC | PRN
  Administered 2022-02-19: 1000 mL

## 2022-02-19 MED ORDER — ONDANSETRON HCL 4 MG/2ML IJ SOLN
INTRAMUSCULAR | Status: AC
  Filled 2022-02-19: qty 2

## 2022-02-19 MED ORDER — LIDOCAINE HCL (PF) 2 % IJ SOLN
INTRAMUSCULAR | Status: AC
  Filled 2022-02-19: qty 15

## 2022-02-19 MED ORDER — CLINDAMYCIN PHOSPHATE 900 MG/50ML IV SOLN
INTRAVENOUS | Status: AC
  Filled 2022-02-19: qty 50

## 2022-02-19 MED ORDER — BUPIVACAINE HCL (PF) 0.25 % IJ SOLN
INTRAMUSCULAR | Status: AC
  Filled 2022-02-19: qty 20

## 2022-02-19 MED ORDER — MIDAZOLAM HCL 2 MG/2ML IJ SOLN
INTRAMUSCULAR | Status: AC
  Filled 2022-02-19: qty 2

## 2022-02-19 MED ORDER — CHLORHEXIDINE GLUCONATE 0.12 % MT SOLN: 15.0000 mL | Freq: Once | OROMUCOSAL | Status: DC

## 2022-02-19 MED ORDER — LIDOCAINE HCL (PF) 1 % IJ SOLN
INTRAMUSCULAR | Status: AC
  Filled 2022-02-19: qty 30

## 2022-02-19 MED ORDER — ONDANSETRON HCL 4 MG/2ML IJ SOLN
INTRAMUSCULAR | Status: DC | PRN
  Administered 2022-02-19: 4 mg via INTRAVENOUS

## 2022-02-19 MED ORDER — PROPOFOL 500 MG/50ML IV EMUL
INTRAVENOUS | Status: DC | PRN
  Administered 2022-02-19: 50 ug/kg/min via INTRAVENOUS

## 2022-02-19 MED ORDER — CEFAZOLIN SODIUM-DEXTROSE 2-4 GM/100ML-% IV SOLN
INTRAVENOUS | Status: AC
  Filled 2022-02-19: qty 100

## 2022-02-19 MED ORDER — MORPHINE SULFATE 15 MG PO TABS: 15.0000 mg | ORAL_TABLET | Freq: Four times a day (QID) | ORAL | 0 refills | Status: AC | PRN

## 2022-02-19 MED ORDER — PROPOFOL 10 MG/ML IV BOLUS
INTRAVENOUS | Status: DC | PRN
  Administered 2022-02-19: 140 mg via INTRAVENOUS

## 2022-02-19 MED ORDER — DEXAMETHASONE SODIUM PHOSPHATE 10 MG/ML IJ SOLN
INTRAMUSCULAR | Status: AC
  Filled 2022-02-19: qty 1

## 2022-02-19 MED ORDER — BUPIVACAINE LIPOSOME 1.3 % IJ SUSP
INTRAMUSCULAR | Status: AC
  Filled 2022-02-19: qty 20

## 2022-02-19 MED ORDER — FENTANYL CITRATE (PF) 100 MCG/2ML IJ SOLN
INTRAMUSCULAR | Status: DC | PRN
  Administered 2022-02-19: 25 ug via INTRAVENOUS
  Administered 2022-02-19: 50 ug via INTRAVENOUS
  Administered 2022-02-19: 25 ug via INTRAVENOUS

## 2022-02-19 MED ORDER — BUPIVACAINE HCL (PF) 0.25 % IJ SOLN
INTRAMUSCULAR | Status: DC | PRN
  Administered 2022-02-19: 20 mL

## 2022-02-19 MED ORDER — MIDAZOLAM HCL 5 MG/5ML IJ SOLN
INTRAMUSCULAR | Status: DC | PRN
  Administered 2022-02-19: 1 mg via INTRAVENOUS

## 2022-02-19 MED ORDER — DEXAMETHASONE SODIUM PHOSPHATE 4 MG/ML IJ SOLN
4.0000 mg | INTRAMUSCULAR | Status: AC
  Administered 2022-02-19: 8 mg via INTRAVENOUS

## 2022-02-19 MED ORDER — ORAL CARE MOUTH RINSE: 15.0000 mL | Freq: Once | OROMUCOSAL | Status: DC

## 2022-02-19 MED ORDER — LACTATED RINGERS IV SOLN: INTRAVENOUS | Status: DC

## 2022-02-19 MED ORDER — FERRIC SUBSULFATE 259 MG/GM EX SOLN
CUTANEOUS | Status: AC
  Filled 2022-02-19: qty 8

## 2022-02-19 MED ORDER — FENTANYL CITRATE (PF) 100 MCG/2ML IJ SOLN
INTRAMUSCULAR | Status: AC
  Filled 2022-02-19: qty 2

## 2022-02-19 MED ORDER — PROPOFOL 10 MG/ML IV BOLUS
INTRAVENOUS | Status: AC
  Filled 2022-02-19: qty 20

## 2022-02-19 MED ORDER — EPHEDRINE SULFATE (PRESSORS) 50 MG/ML IJ SOLN
INTRAMUSCULAR | Status: DC | PRN
  Administered 2022-02-19: 7 mg via INTRAVENOUS

## 2022-02-19 MED ORDER — GLYCOPYRROLATE 0.2 MG/ML IJ SOLN
INTRAMUSCULAR | Status: DC | PRN
  Administered 2022-02-19: .2 mg via INTRAVENOUS

## 2022-02-19 MED ORDER — LIDOCAINE HCL (PF) 2 % IJ SOLN
INTRAMUSCULAR | Status: DC | PRN
  Administered 2022-02-19: 1.5 mg/kg/h via INTRADERMAL

## 2022-02-19 MED ORDER — IODINE STRONG (LUGOLS) 5 % PO SOLN
ORAL | Status: AC
  Filled 2022-02-19: qty 1

## 2022-02-19 MED ORDER — SODIUM CHLORIDE 0.9 % IV SOLN: INTRAVENOUS | Status: DC | PRN

## 2022-02-19 MED ORDER — BUPIVACAINE LIPOSOME 1.3 % IJ SUSP
INTRAMUSCULAR | Status: DC | PRN
  Administered 2022-02-19: 20 mL

## 2022-02-19 SURGICAL SUPPLY — 41 items
BLADE SURG 15 STRL LF DISP TIS (BLADE) IMPLANT
BLADE SURG 15 STRL SS (BLADE) ×1
BLADE SURG SZ11 CARB STEEL (BLADE) IMPLANT
CATH ROBINSON RED A/P 16FR (CATHETERS) IMPLANT
DEPRESSOR TONGUE 6 IN STERILE (GAUZE/BANDAGES/DRESSINGS) ×1 IMPLANT
DRAPE UNDERBUTTOCKS STRL (DISPOSABLE) IMPLANT
DRSG TELFA 3X8 NADH STRL (GAUZE/BANDAGES/DRESSINGS) IMPLANT
ELECT BALL LEEP 3MM BLK (ELECTRODE) IMPLANT
GAUZE 4X4 16PLY ~~LOC~~+RFID DBL (SPONGE) ×1 IMPLANT
GLOVE BIO SURGEON STRL SZ 6 (GLOVE) ×2 IMPLANT
GOWN STRL REUS W/ TWL LRG LVL3 (GOWN DISPOSABLE) ×1 IMPLANT
GOWN STRL REUS W/TWL LRG LVL3 (GOWN DISPOSABLE) ×1
KIT TURNOVER KIT A (KITS) ×1 IMPLANT
NDL HYPO 25X1 1.5 SAFETY (NEEDLE) ×1 IMPLANT
NDL SPNL 22GX7 QUINCKE BK (NEEDLE) IMPLANT
NEEDLE HYPO 22GX1.5 SAFETY (NEEDLE) IMPLANT
NEEDLE HYPO 25X1 1.5 SAFETY (NEEDLE) ×1 IMPLANT
NEEDLE SPNL 22GX7 QUINCKE BK (NEEDLE) IMPLANT
PACK LITHOTOMY IV (CUSTOM PROCEDURE TRAY) ×1 IMPLANT
PACK PERINEAL COLD (PAD) ×1 IMPLANT
PACK VAGINAL WOMENS (CUSTOM PROCEDURE TRAY) ×1 IMPLANT
PAD PREP 24X48 CUFFED NSTRL (MISCELLANEOUS) ×1 IMPLANT
PUNCH BIOPSY 3 (MISCELLANEOUS) IMPLANT
SCOPETTES 8  STERILE (MISCELLANEOUS)
SCOPETTES 8 STERILE (MISCELLANEOUS) ×2 IMPLANT
SOL PREP POV-IOD 4OZ 10% (MISCELLANEOUS) ×1 IMPLANT
SUT VIC AB 0 CT1 36 (SUTURE) IMPLANT
SUT VIC AB 2-0 SH 27 (SUTURE) ×2
SUT VIC AB 2-0 SH 27XBRD (SUTURE) IMPLANT
SUT VIC AB 3-0 PS2 18 (SUTURE) IMPLANT
SUT VIC AB 3-0 PS2 18XBRD (SUTURE) IMPLANT
SUT VIC AB 3-0 SH 27 (SUTURE) ×1
SUT VIC AB 3-0 SH 27X BRD (SUTURE) IMPLANT
SUT VIC AB 4-0 PS2 18 (SUTURE) IMPLANT
SUT VICRYL 0 UR6 27IN ABS (SUTURE) IMPLANT
SYR BULB IRRIG 60ML STRL (SYRINGE) ×1 IMPLANT
SYR CONTROL 10ML LL (SYRINGE) ×1 IMPLANT
TOWEL OR 17X26 10 PK STRL BLUE (TOWEL DISPOSABLE) ×2 IMPLANT
TUBING CONNECTING 10 (TUBING) IMPLANT
VACUUM HOSE 7/8X10 W/ WAND (MISCELLANEOUS) ×1 IMPLANT
WATER STERILE IRR 500ML POUR (IV SOLUTION) ×2 IMPLANT

## 2022-02-19 NOTE — Telephone Encounter (Signed)
Patient called and asked "Can I take half a Unisom today for sleep." Per [facility] APP patient can take half a Unisom. Explained to the patient that per [facility] APP she can take half a pill

## 2022-02-19 NOTE — Transfer of Care (Signed)
Immediate Anesthesia Transfer of Care Note  [name]  Procedure(s) Performed: Jasmine December UNDER ANESTHESIA VULVAR LESION EXCISION, ROTATIONAL FLAP CLOSURE  Patient Location: PACU  Anesthesia Type:General  Level of Consciousness: oriented, drowsy, and patient cooperative  Airway & Oxygen Therapy: Patient Spontanous Breathing and Patient connected to face mask oxygen  Post-op Assessment: Report given to RN and Post -op Vital signs reviewed and stable  Post vital signs: Reviewed and stable  Last Vitals:  Vitals Value Taken Time  BP    Temp    Pulse    Resp    SpO2      Last Pain:  Vitals:   02/19/22 0720  TempSrc:   PainSc: 0-No pain         Complications: No notable events documented.

## 2022-02-19 NOTE — Anesthesia Procedure Notes (Signed)
Procedure Name: LMA Insertion Date/Time: 02/19/2022 8:35 AM  Performed by: Garrel Ridgel, CRNAPre-anesthesia Checklist: Patient identified, Emergency Drugs available, Suction available and Patient being monitored Patient Re-evaluated:Patient Re-evaluated prior to induction Oxygen Delivery Method: Circle system utilized Preoxygenation: Pre-oxygenation with 100% oxygen Induction Type: IV induction Ventilation: Mask ventilation without difficulty LMA: LMA flexible inserted LMA Size: 4.0 Tube type: Oral Number of attempts: 1 Airway Equipment and Method: Stylet Placement Confirmation: positive ETCO2 and breath sounds checked- equal and bilateral Tube secured with: Tape Dental Injury: Teeth and Oropharynx as per pre-operative assessment

## 2022-02-19 NOTE — Interval H&P Note (Signed)
History and Physical Interval Note:  02/19/2022 7:44 AM  [name] Richards  has presented today for surgery, with the diagnosis of VULVAR LESION HX OF VULVAR CANCER.  The various methods of treatment have been discussed with the patient and family. After consideration of risks, benefits and other options for treatment, the patient has consented to  Procedure(s): EXAM UNDER ANESTHESIA (N/A) VULVAR LESION EXCISION (N/A) POSSIBLE VULVAR BIOPSY (N/A) POSSIBLE CO2 LASER APPLICATION TO THE VULVA (N/A) as a surgical intervention.  The patient's history has been reviewed, patient examined, no change in status, stable for surgery.  I have reviewed the patient's chart and labs.  Questions were answered to the patient's satisfaction.     [name]

## 2022-02-19 NOTE — Op Note (Signed)
Operative Report  PATIENT: Alycia Cooperwood DATE: 02/19/22  Preop Diagnosis: history of vulvar and vaginal cancer, suspicion for recurrent vulvar cancer  Postoperative Diagnosis: same as above  Surgery: Partial simple right/posterior vulvectomy with rotational rhomboid flap   Surgeons: Valarie Cones MD  Assistant: none  Anesthesia: General   Estimated blood loss: 50 ml  IVF:  see I&O flowsheet  Urine output: n/a  Complications: None apparent   Pathology: posterior vulva (6-8 o'c) with stitch at 12, additional superior, lateral and inferior margins, deep margin  Operative findings: 2 x 2.5 cm verrucous appearing lesion along the perineum and extending to the introitus, relatively broad 2 cm base. Given size of defect and its location, required rotational flap to close defect. Hyperkeratotic areas within the significantly foreshortened vagina.   Procedure: The patient was identified in the preoperative holding area. Informed consent was signed on the chart. Patient was seen history was reviewed and exam was performed.    The patient was then taken to the operating room and placed in the supine position with SCD hose on. General anesthesia was then induced without difficulty. She was then placed in the dorsolithotomy position. The perineum was prepped with Betadine. The vagina was prepped with Betadine. The patient was then draped after the prep was dried.    Timeout was performed the patient, procedure, antibiotic, allergy, and length of procedure. The vulvar and vaginal tissues were examined. The lesion was identified.The 15 blade scalpel was used to make an incision through the skin circumferentially around the lesion. The skin lesion was grasped and was separated from the underlying deep dermal tissues with the bovie device. After the specimen had been completely resected, it was oriented and marked with a stitch. Margins were taken circumferentially around area excised (trying  to limit normal tissue removed) and from base of excision. The bovie was used to obtain hemostasis at the surgical bed. The subcutaneous tissues were irrigated and made hemostatic. The subcutaneous tissue of the entire incision was infiltrated with Exparel mixed with 0.25% marcaine.   It was apparent that the defect was too large to be closed with primary closure, therefore decision was made to perform a rhomboid rotational flap from the lateral tissue on the right.  The margins of the flap were identified and marked the flap was incised with the scalpel and subcutaneous tissues were separated using a Bovie.  Care was made to ensure the depth of flap was at least 1 cm and appeared viable well-perfused.  The length of the flap did not exceed the width of the base.  The flap was rotated to reapproximate skin edges from the margins at the introitus and along the perineum.  2-0 and 3-0 Vicryl sutures were used through the subcutaneous fat to stabilize the flap.  The subcutaneous tissue was reapproximated along the length of the incision and flap with 3-0 Vicryl mattress sutures and 4-0 Vicryl interrupted stitches to close the skin in a tension-free manner.   All instrument, suture, laparotomy, Ray-Tec, and needle counts were correct x2. The patient tolerated the procedure well and was taken recovery room in stable condition.  Jeral Pinch MD Gynecologic Oncology

## 2022-02-19 NOTE — Discharge Instructions (Addendum)
AFTER SURGERY INSTRUCTIONS   PLEASE, limit longer periods of walking and NO gym or heavier activity for at least 3-4 weeks (until you see [place] back in clinic)  Please don't take the oral morphine more than every 4-6 hours and use non narcotics medications (like ibuprofen and tylenol) first. Please make sure you are taking medications to prevent constipation if you use the morphine.   We recommend purchasing several bags of frozen green peas and dividing them into ziploc bags. You will want to keep these in the freezer and have them ready to use as ice packs to the vulvar incision. Once the ice pack is no longer cold, you can get another from the freezer. The frozen peas mold to your body better than a regular ice pack.    Activity: 1. Be up and out of the bed during the day.  Take a nap if needed.  You may walk up steps but be careful and use the hand rail.  Stair climbing will tire you more than you think, you may need to stop part way and rest.    2. No lifting or straining for 2 weeks over 10 pounds. No pushing, pulling, straining for 2 weeks.   3. No driving for minimum 24 hours after surgery.  Do not drive if you are taking narcotic pain medicine and make sure that your reaction time has returned.    4. You can shower as soon as the next day after surgery. Shower daily. No tub baths or submerging your body in water until cleared by your surgeon. If you have the soap that was given to you by pre-surgical testing that was used before surgery, you do not need to use it afterwards because this can irritate your incisions.    5. No sexual activity and nothing in the vagina for 4 weeks.   6. You may experience vaginal spotting and discharge after surgery.  The spotting is normal but if you experience heavy bleeding, call our office.   7. Take Tylenol or ibuprofen for pain if you are able to take these medications.  Monitor your Tylenol intake to a max of 4,000 mg in a 24 hour period. You can  alternate these medications after surgery.   Diet: 1. Low sodium Heart Healthy Diet is recommended but you are cleared to resume your normal (before surgery) diet after your procedure.   2. It is safe to use a laxative, such as Miralax or Colace, if you have difficulty moving your bowels.    Wound Care: 1. Keep clean and dry.  Shower daily.   Reasons to call the Doctor: Fever - Oral temperature greater than 100.4 degrees Fahrenheit Foul-smelling vaginal discharge Difficulty urinating Nausea and vomiting Increased pain at the site of the incision that is unrelieved with pain medicine. Difficulty breathing with or without chest pain New calf pain especially if only on one side Sudden, continuing increased vaginal bleeding with or without clots.   Contacts: For questions or concerns you should contact:   Dr. Jeral Pinch at [phone]   Joylene John, NP at [phone]   After Hours: call [phone] and have the GYN Oncologist paged/contacted (after 5 pm or on the weekends).   Messages sent via mychart are for non-urgent matters and are not responded to after hours so for urgent needs, please call the after hours number.

## 2022-02-19 NOTE — Anesthesia Postprocedure Evaluation (Signed)
Anesthesia Post Note  [name]  Procedure(s) Performed: EXAM UNDER ANESTHESIA VULVAR LESION EXCISION, ROTATIONAL FLAP CLOSURE     Patient location during evaluation: PACU Anesthesia Type: General Level of consciousness: awake and alert Pain management: pain level controlled Vital Signs Assessment: post-procedure vital signs reviewed and stable Respiratory status: spontaneous breathing, nonlabored ventilation, respiratory function stable and patient connected to nasal cannula oxygen Cardiovascular status: blood pressure returned to baseline and stable Postop Assessment: no apparent nausea or vomiting Anesthetic complications: no  No notable events documented.  Last Vitals:  Vitals:   02/19/22 1100 02/19/22 1115  BP: (!) 163/72 (!) 161/71  Pulse: 61 62  Resp: 20 18  Temp:    SpO2: 100% 100%    Last Pain:  Vitals:   02/19/22 1115  TempSrc:   PainSc: 0-No pain                 Larkin Alfred

## 2022-02-20 ENCOUNTER — Encounter (HOSPITAL_COMMUNITY): Payer: Self-pay | Admitting: Gynecologic Oncology

## 2022-02-20 ENCOUNTER — Telehealth: Payer: Self-pay | Admitting: Surgery

## 2022-02-20 NOTE — Telephone Encounter (Signed)
Spoke with Ms. Massman this morning. She states she is eating, drinking and urinating well. Patient states she did have some difficulty urinating yesterday but that it has improved today. She has had a BM this morning. She is taking Miralax as prescribed and encouraged her to drink plenty of water. She denies fever or chills. Incisions are dry and intact. She rates her pain 0/10. Her pain is controlled with Tylenol.     Instructed to call office with any fever, chills, purulent drainage, uncontrolled pain or any other questions or concerns. Patient advised that she can use peri-bottle when urinating to help with burning sensation. Patient verbalizes understanding.   Pt aware of post op appointments as well as the office number [phone] and after hours number [phone] to call if she has any questions or concerns

## 2022-02-25 ENCOUNTER — Other Ambulatory Visit: Payer: Self-pay | Admitting: Gynecologic Oncology

## 2022-02-25 DIAGNOSIS — C519 Malignant neoplasm of vulva, unspecified: Secondary | ICD-10-CM

## 2022-02-25 LAB — SURGICAL PATHOLOGY

## 2022-02-25 NOTE — Progress Notes (Signed)
Spoke with patient about pathology results from surgery. All questions answered. Plan for PET scan at least 3- 4weeks post-op.  [name] Garnet MD Gynecologic Oncology

## 2022-02-26 ENCOUNTER — Encounter: Payer: Self-pay | Admitting: Gynecologic Oncology

## 2022-02-27 ENCOUNTER — Telehealth: Payer: Self-pay

## 2022-02-27 ENCOUNTER — Telehealth: Payer: Self-pay | Admitting: *Deleted

## 2022-02-27 NOTE — Telephone Encounter (Signed)
I reached out to Ms. Valenza in regards to her MyChart message stating she is having itching/burning in the vulvar area.   Recently had EUA vulvar lesion excision, rotational flap closure on 1/10  I reviewed triage protocol for vaginal or vulvar itching with patient.   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 Not having any Dysuria (pain, pressure, burning or frequency). She uses the peri-bottle every time she urinates. No d/c and no odor. She has been wearing loose fitting clothing/pajamas She has not been using the Clobetasol stating "it thins the skin so it is a catch 22" but stated she could use it to see if it will help at all. She  has not changed soaps or detergents and not tried anything OTC.  Ms. Jarquin aware Dr. Pricilla Holm is in the OR. I will make her aware of the concern and will call pt back with advise.

## 2022-02-27 NOTE — Telephone Encounter (Signed)
Per Dr Pricilla Holm PET scheduled

## 2022-02-28 ENCOUNTER — Telehealth: Payer: Medicare Other | Admitting: Gynecologic Oncology

## 2022-02-28 NOTE — Telephone Encounter (Signed)
Call to patient and notified of instructions from Dr Pricilla Holm as noted below.  Stressed to use Clobetasol only twice per week and avoid operative site.  Patient agreeable to plan. To call back if not improving

## 2022-02-28 NOTE — Telephone Encounter (Signed)
Follow- up call to patient regarding vulvar itching. Reports no change in discomfort. Ranges from 4-9 depending on activity.  Operative site is not painful. Lichen sclerosis area cause of burning/ itching. Using hair dryer on cool setting and patting with towel.  Has not resumed cold compress and advised to try this. Confirmed should not use cream at least until post-op check on 03-14-22.  Patient is asking if any other option for relief.  Advised will review with provider and call back if any additions.

## 2022-02-28 NOTE — Telephone Encounter (Signed)
Call back from patient.  Has had change in bleeding from surgery site.  Was just spotting and now has mini-pad 1/3 covered (not soaked) and brighter red. Has lifted laundry basket this morning. Has not used Clobetasol yet. Denies pain from surgical area. Advised this is part of the healing process and decrease lifting. Will review with provider and call her back.  Reviewed with Warner Mccreedy, NP who agrees with above. Call back to patient to notify provider reviewed. Call back for additional concerns or continued increased bleeding.

## 2022-03-03 ENCOUNTER — Other Ambulatory Visit: Payer: Self-pay | Admitting: Oncology

## 2022-03-03 NOTE — Progress Notes (Signed)
Gynecologic Oncology Multi-Disciplinary Disposition Conference Note  Date of the Conference: 03/03/2022  Patient Name: [name] Richards  Primary GYN Oncologist: Dr. 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 Disposition is for a PET scan.  If negative, then continued observation.   This Multidisciplinary conference took place involving physicians from Gynecologic Oncology, Medical Oncology, Radiation Oncology, Pathology, Radiology along with the Gynecologic Oncology Nurse Practitioner and Gynecologic Oncology Nurse Navigator.  Comprehensive assessment of the patient's malignancy, staging, need for surgery, chemotherapy, radiation therapy, and need for further testing were reviewed. Supportive measures, both inpatient and following discharge were also discussed. The recommended plan of care is documented. Greater than 35 minutes were spent correlating and coordinating this patient's care.

## 2022-03-13 ENCOUNTER — Encounter (HOSPITAL_COMMUNITY)
Admission: RE | Admit: 2022-03-13 | Discharge: 2022-03-13 | Disposition: A | Discharge status: Home / Self Care | Payer: Medicare Other | Source: Ambulatory Visit | Attending: Gynecologic Oncology | Admitting: Gynecologic Oncology

## 2022-03-13 DIAGNOSIS — C519 Malignant neoplasm of vulva, unspecified: Secondary | ICD-10-CM | POA: Diagnosis present

## 2022-03-13 LAB — GLUCOSE, CAPILLARY: Glucose-Capillary: 111 mg/dL — ABNORMAL HIGH (ref 70–99)

## 2022-03-13 MED ORDER — FLUDEOXYGLUCOSE F - 18 (FDG) INJECTION
7.6000 | Freq: Once | INTRAVENOUS | Status: AC
  Administered 2022-03-13: 7.6 via INTRAVENOUS

## 2022-03-13 NOTE — Progress Notes (Signed)
Gynecologic Oncology Post-op Follow up  [name] Richards is a 77 y.o. woman with a long gynecologic history including recurrent vaginal cancer, recurrent vulvar cancer as well as high-grade vulvar dysplasia status post partial simple right/posterior vulvectomy with rotational rhomboid flap for a vulvar lesion suspicious of recurrence. Final pathology returned with recurrent invasive squamous cell carcinoma, moderately differentiated with positive margins at 3:00 and 9:00. Based on these results, a PET scan was ordered and performed on 03/13/22.    She presents today to the office for postoperative follow-up and incision check.  She states she has been doing well at home.  She is only had very minimal spotting that she noticed on her peri-pad.  No abnormal discharge reported.  Tolerating diet with no issues.  Bowels and bladder functioning without difficulty.  No pain reported.  Denies fever and chills.  She is wanting to start increasing activities at the gym.  She has had vulvar itching symptoms.  She continues to use clobetasol twice a week.  No itching anywhere else on the body.  On exam alert oriented.  Lungs clear heart normal in rate and rhythm.  Vulvar incision is healing with suture material present right lower vulvar incision has opened superficially with no surrounding erythema or abnormal drainage.  No lower extremity edema reported

## 2022-03-14 ENCOUNTER — Inpatient Hospital Stay: Payer: Medicare Other | Attending: Gynecologic Oncology | Admitting: Gynecologic Oncology

## 2022-03-14 ENCOUNTER — Telehealth: Payer: Self-pay | Admitting: Gynecologic Oncology

## 2022-03-14 VITALS — BP 151/71 | HR 72 | Temp 98.2°F | Resp 18 | Wt 154.2 lb

## 2022-03-14 DIAGNOSIS — Z8544 Personal history of malignant neoplasm of other female genital organs: Secondary | ICD-10-CM | POA: Insufficient documentation

## 2022-03-14 DIAGNOSIS — C519 Malignant neoplasm of vulva, unspecified: Secondary | ICD-10-CM

## 2022-03-14 DIAGNOSIS — Z9079 Acquired absence of other genital organ(s): Secondary | ICD-10-CM

## 2022-03-14 NOTE — Telephone Encounter (Signed)
Called the patient.  Discussed PET scan results with her.  Also reviewed surgical pathology results to clarify margin status.  After reviewing her PET scan with reading radiologist, given the fact that she is not received any treatment in the year since her prior PET scan, low concern that mildly avid lymph node in the left groin is related to metastatic cancer.  Also suspect that what is being seen on the vulva is related to either urinary contamination or recent surgery.  Based on our discussion at tumor board, we will plan for close observation without additional radiation at this time.  Jeral Pinch MD Gynecologic Oncology

## 2022-03-14 NOTE — Telephone Encounter (Signed)
In error

## 2022-03-14 NOTE — Patient Instructions (Signed)
You are healing well from surgery. Continue with your peri care. You can start introducing light exercises at the gym. If you feel discomfort or straining of the vulva, stop the exercise you are performing.  Dr. Berline Lopes will call you to discuss the PET scan further and recommendations moving forward.   You can try taking claritin or allegra for the vulva itching symptoms to see if this helps.  Please call the office for any new symptoms or needs.

## 2022-04-04 ENCOUNTER — Encounter: Payer: Self-pay | Admitting: Gynecologic Oncology

## 2022-04-04 ENCOUNTER — Inpatient Hospital Stay (HOSPITAL_BASED_OUTPATIENT_CLINIC_OR_DEPARTMENT_OTHER): Payer: Medicare Other | Admitting: Gynecologic Oncology

## 2022-04-04 VITALS — BP 177/69 | HR 69 | Temp 97.8°F | Resp 16 | Ht 65.0 in | Wt 153.0 lb

## 2022-04-04 DIAGNOSIS — N898 Other specified noninflammatory disorders of vagina: Secondary | ICD-10-CM

## 2022-04-04 DIAGNOSIS — Z9079 Acquired absence of other genital organ(s): Secondary | ICD-10-CM

## 2022-04-04 DIAGNOSIS — C519 Malignant neoplasm of vulva, unspecified: Secondary | ICD-10-CM

## 2022-04-04 DIAGNOSIS — Z8544 Personal history of malignant neoplasm of other female genital organs: Secondary | ICD-10-CM | POA: Diagnosis present

## 2022-04-04 NOTE — Patient Instructions (Addendum)
It was good to see you today.  You are healing well from surgery.   I will call you with your biopsy results next week.  We will tentatively get you scheduled for a follow-up in 3 months.  If you develop any new symptoms or feel that new spots on your vulva, please call to see me sooner.

## 2022-04-04 NOTE — Progress Notes (Signed)
Gynecologic Oncology Return Clinic Visit  04/04/22  Reason for Visit: treatment planning  Treatment History: Oncology History Overview Note  Patient followed due to HX pulmonary nodules noted 2011  Primary cancer of left upper lobe of lung ([place])   Staging form: Lung, AJCC 7th Edition   - Clinical stage from 09/27/2015: Stage IA (T1b, N0, M0) - Signed by Curt Bears, MD on 09/27/2015    Primary cancer of left upper lobe of lung ([place])  07/12/2015 Imaging   CT Chest IMPRESSION:  The solid component of the large part solid nodule in the left upper lobe measures 9 x 3 mm (mean diameter 6 mm.) persistent part solid nodules with solid components greater than or equal to 6 mm should be considered highly suspicious for pulmonary adenocarcinoma   08/01/2015 Initial Diagnosis   Primary cancer of left upper lobe of lung ([place])   08/01/2015 Surgery   PROCEDURE:  Left video-assisted thoracoscopy,           Wedge resection,           Thoracoscopic lingular sparing left upper lobectomy           Lymph node dissection           On-Q local anesthetic catheter placement   08/01/2015 Pathology Results   Lung, resection (segmental or lobe), Left Upper Lobe - ADENOCARCINOMA, WELL DIFFERENTIATED, SPANNING 2.5 CM. - THE SURGICAL RESECTION MARGINS ARE NEGATIVE FOR CARCINOMA. 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 She underwent a total abdominal hysterectomy and bilateral salpingo-oophorectomy, pelvic and para-aortic lymphadenectomy. She received postoperative whole pelvis radiation therapy.  Her other primary gynecologic problem has been lichen sclerosus managed with when necessary clobetasol.   Beginning in 2013 the patient had intermittent episodes of partial small bowel obstruction most likely secondary to radiation injury to the terminal  ileum.   In December 2016 the patient was found to have a new primary squamous cell carcinoma the vulva undergoing a modified radical vulvectomy on 02/06/2015. She underwent a modified radical vulvectomy at [facility] on 02/06/2015. She was found to have a 3 cm vulvar lesion of the posterior vulva extending into the posterior vagina. A rhomboid flap was required to close the incision.  Final pathology showed some close surgical margins. However, the specimen margins were toward the time of surgery and it was Dr Mora Bellman impression that they were widely around the primary cancer. She discussed options with the patient and they agreed to monitor her closely but not recommend any radiation therapy to the vulva.   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  Depth of invasion was 4 mm all margins were negative.  Follow-up PET scan March 23, 2017 showed no evidence of metastatic disease.   Examination of the vulva during routine surveillance on 07/20/19 showed a friable, verrucous lesion measuring approximately 2 cm was seen on the anterior wall of the suburethral vagina extending to the introitus and wrapping around to the right mid vagina. This was concerning for recurrence and was biopsied at that appointment. 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    On 08/11/19 she underwent total vaginectomy. Intraoperative findings were significant for a forshortened 3cm vagina, friable 4cm plaque on the anterior vagina extending to the right fornix and  left posterior vaginal wall. This necessitated a total vaginectomy to resect the lesion grossly. Her tissues were extremely friable due to age, prior radiation, menopause and lichen sclerosis. There was unavoidable fragmentation of the tissues during the resection. All gross visible disease was removed. Surgery was uncomplicated, and she reported no postop pain.  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 The suspicious foci are present at the edges of tissue fragments.  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    Post-op PET scan in August, 2021 showed no metastatic disease.   The patient received adjuvant vaginal brachytherapy between 09/29/2019 through 10/12/2019.  She tolerated treatment very well with no toxicities.   I saw the patient on 12/28.  At the time of her visit, I was concerned for dysplastic changes versus radiation treatment within the vagina and on her vulva.   On 02/14/2021, the patient underwent exam under anesthesia, vaginal and vulvar biopsies.  Findings were notable for anatomic changes related to prior surgical treatment and radiation.  Significant atrophy was noted.  Multiple spots of hyperkeratosis versus acetowhite after application of acetic acid were found, all biopsied.  Final pathology revealed invasive squamous cell carcinoma at 5:00 of the vaginal biopsy.  Multiple other biopsies including a 1:00 periclitoral biopsy and apex of the vagina showed low-grade squamous intraepithelial lesion.  Biopsy from 7:00 within the vagina showed high-grade squamous intraepithelial lesion. Submitted biopsies are noted to be very small, superficial, and oriented.  Most of them have dysplasia or carcinoma extended to the lateral edges but without involvement of the base of the biopsy.  The one biopsy showing carcinoma is 1 mm thick and described as extending to the edge of the biopsy where the lesion is transected and therefore measurement likely does not accurately represent the final thickness.   PET scan was performed on 02/28/2021.  There was noted to be interval decrease in degree of FDG uptake within the vaginal  introitus compared to PET scan in 2021 after her last surgery.  No concerning findings for metastatic disease.  Comment was noted that there is mild uptake within a left inguinal lymph node but the morphology is benign.  Imaging was reviewed at our tumor conference on 1/23 with discussion being findings did not represent metastatic disease.   03/20/21: Vaginectomy, CO2 laser of the vulva for recurrent vaginal cancer, high-grade vulvar dysplasia.  Vaginal excision showed squamous mucosa with ulceration and inflammation.  No malignancy or dysplasia identified.  Rare atypical "radiation" fibroblast embedded.  01/2022: Developed new vulvar lesion concerning for at least high-grade dysplasia, malignancy suspected.  02/19/2022: Partial simple right posterior vulvectomy with rotational rhomboid flap.  Final pathology revealed recurrent grade 2 invasive squamous cell carcinoma, depth of invasion 4 mm, margins negative.  03/13/2022: PET/CT shows focal hypermetabolism in the region of the vaginal introitus.  Left groin node seen on prior study is stable to minimally increased in size with low-level hypermetabolism.  No new sites of hypermetabolic disease.  On review with radiologist, given no treatment in the year interval since prior study, left groin node is less suspicious for active disease, consistent with reactive etiology.  Changes in the vaginal introitus region thought to represent either postoperative change or urinary contamination.  Interval History: The patient reports doing well.  Was using the donut pillow until very  recently.  Is back at the gym.  Reports that spotting from her incision has stopped.  She denies any discharge.  Reports baseline bowel bladder function.  Continues to have itching on her labia, using clobetasol.  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 S/p TAH and radiation x6 wks    Complication of anesthesia    took 2 days to wake up after 1 surgery at 20, pt can't use a adult cath,needs peds. cath due to past injury   Constipation    very worried about this   Endometrial cancer [facility]) 1997   coincidental endometrial cancer also   History of radiation therapy 10/12/2019   [facility] HDR brachytherapy  09/29/2019-10/12/2019  Dr Gery Pray   Hyperlipemia    Hypertension    Hypothyroidism    Primary cancer of left upper lobe of lung ([place]) 08/01/2015   Small bowel obstruction ([place])    12/2008, repeat SBO in 02/2011 also with terminal ilietis    Thyroid disease    Vertebral body hemangioma    T12    Past Surgical History:  Procedure Laterality Date   ABDOMINAL HYSTERECTOMY  Feb 1997   TAH/BSO, pelvic and periaortic lymphadenectomy    BACK SURGERY     CO2 LASER APPLICATION N/A [id number]   Procedure: CO2 LASER APPLICATION to vagina and vuvla;  Surgeon: [name], MD;  Location: [facility];  Service: Gynecology;  Laterality: N/A;   EYE SURGERY     Lasik    LYMPH NODE DISSECTION Left 08/01/2015   Procedure: LYMPH NODE DISSECTION;  Surgeon: Melrose Nakayama, MD;  Location: [place];  Service: Thoracic;  Laterality: Left;   PARTIAL NEPHRECTOMY     right   SEGMENTECOMY Left 08/01/2015   Procedure: Left Upper Lobe SEGMENTECTOMY;  Surgeon: Melrose Nakayama, MD;  Location: [place];  Service: Thoracic;  Laterality: Left;   SKIN BIOPSY     TUBAL LIGATION     TUMOR REMOVAL     meningioma  T 12   VAGINECTOMY, PARTIAL N/A 08/11/2019   Procedure: TOTAL VAGINECTOMY;  Surgeon: Everitt Amber, MD;  Location: [facility];  Service: Gynecology;  Laterality: N/A;   VAGINECTOMY, PARTIAL N/A 03/20/2021   Procedure: vaginal excision;  Surgeon: [name], MD;  Location: [facility];  Service: Gynecology;  Laterality: N/A;   VIDEO ASSISTED THORACOSCOPY  (VATS)/WEDGE RESECTION Left 08/01/2015   Procedure: VIDEO ASSISTED THORACOSCOPY (VATS)/WEDGE RESECTION;  Surgeon: Melrose Nakayama, MD;  Location: [place];  Service: Thoracic;  Laterality: Left;   VULVA Milagros Loll BIOPSY N/A 02/14/2021   Procedure: VAGINAL AND VULVAR BIOPSY;  Surgeon: [name], MD;  Location: [facility];  Service: Gynecology;  Laterality: N/A;   VULVA SURGERY     x 2, 1 at [facility] (2016), and [place]    VULVAR LESION REMOVAL N/A 02/19/2022   Procedure: VULVAR LESION EXCISION, ROTATIONAL FLAP CLOSURE;  Surgeon: [name], MD;  Location: [facility];  Service: Gynecology;  Laterality: N/A;    Family History  Problem Relation Age of Onset   Hypertension Other    Diabetes Other    Stroke Other    CAD Other    Colon cancer Neg Hx    Breast cancer  Neg Hx    Ovarian cancer Neg Hx    Endometrial cancer Neg Hx    Pancreatic cancer Neg Hx    Prostate cancer Neg Hx     Social History   Socioeconomic History   Marital status: Divorced    Spouse name: Not on file   Number of children: Not on file   Years of education: Not on file   Highest education level: Not on file  Occupational History   Not on file  Tobacco Use   Smoking status: Never   Smokeless tobacco: Never  Vaping Use   Vaping Use: Never used  Substance and Sexual Activity   Alcohol use: No   Drug use: No   Sexual activity: Yes  Other Topics Concern   Not on file  Social History Narrative   Lives at home alone, works out at [employer] 6 days a week, still very active. Has an ex-husband who she is in contact with. Son died of an MI and another son in [place].    Social Determinants of Health   Financial Resource Strain: Not on file  Food Insecurity: Not on file  Transportation Needs: Not on file  Physical Activity: Not on file  Stress: Not on file  Social Connections: Not on file    Current Medications:  Current Outpatient Medications:    acetaminophen  (TYLENOL) 500 MG tablet, Take 500-1,000 mg by mouth every 6 (six) hours as needed (pain.)., Disp: , Rfl:    acyclovir (ZOVIRAX) 800 MG tablet, Take 800 mg by mouth 2 (two) times daily as needed (cold sores)., Disp: , Rfl:    albuterol (VENTOLIN HFA) 108 (90 Base) MCG/ACT inhaler, Inhale 2 puffs into the lungs every 6 (six) hours as needed for wheezing or shortness of breath., Disp: , Rfl:    amLODipine (NORVASC) 5 MG tablet, Take 5 mg by mouth every evening., Disp: , Rfl:    Ascorbic Acid (VITAMIN C PO), Take 500 mg by mouth in the morning., Disp: , Rfl:    benazepril-hydrochlorthiazide (LOTENSIN HCT) 20-25 MG tablet, Take 1 tablet by mouth in the morning., Disp: , Rfl:    benzonatate (TESSALON) 200 MG capsule, Take 200 mg by mouth 2 (two) times daily as needed for cough., Disp: , Rfl:    BIOTIN PO, Take 1 tablet by mouth in the morning., Disp: , Rfl:    Cholecalciferol (VITAMIN D3 PO), Take 2,000 Units by mouth in the morning and at bedtime., Disp: , Rfl:    CRANBERRY PO, Take 1 tablet by mouth in the morning., Disp: , Rfl:    docusate sodium (COLACE) 100 MG capsule, Take 100 mg by mouth 2 (two) times daily., Disp: , Rfl:    doxylamine, Sleep, (UNISOM) 25 MG tablet, Take 12.5 mg by mouth at bedtime., Disp: , Rfl:    ibuprofen (ADVIL) 200 MG tablet, Take 200-600 mg by mouth every 8 (eight) hours as needed (pain.)., Disp: , Rfl:    levothyroxine (SYNTHROID, LEVOTHROID) 75 MCG tablet, Take 75 mcg by mouth daily before breakfast., Disp: , Rfl:    loratadine (CLARITIN) 10 MG tablet, Take 10 mg by mouth daily as needed for allergies., Disp: , Rfl:    MAGNESIUM PO, Take 1 tablet by mouth every evening., Disp: , Rfl:    Multiple Vitamin (MULTIVITAMIN WITH MINERALS) TABS tablet, Take 1 tablet by mouth in the morning., Disp: , Rfl:    Omega-3 Fatty Acids (FISH OIL PO), Take 1 capsule by mouth in  the morning., Disp: , Rfl:    Polyethyl Glycol-Propyl Glycol (SYSTANE ULTRA) 0.4-0.3 % SOLN, Place 1-2 drops into  both eyes 3 (three) times daily as needed (dry/irritated eyes.)., Disp: , Rfl:    polyethylene glycol (MIRALAX / GLYCOLAX) packet, Take 11.3333 g by mouth at bedtime. 2/3 capful, Disp: , Rfl:    simvastatin (ZOCOR) 20 MG tablet, Take 20 mg by mouth at bedtime. , Disp: , Rfl:    TURMERIC PO, Take 1 capsule by mouth every evening., Disp: , Rfl:    clobetasol cream (TEMOVATE) 0.05 %, Use fingertip size amount of cream on your finger and apply slightly past the opening of the vagina three times a week at night (Patient not taking: Reported on 04/04/2022), Disp: 30 g, Rfl: 4  Review of Systems: Denies appetite changes, fevers, chills, fatigue, unexplained weight changes. Denies hearing loss, neck lumps or masses, mouth sores, ringing in ears or voice changes. Denies cough or wheezing.  Denies shortness of breath. Denies chest pain or palpitations. Denies leg swelling. Denies abdominal distention, pain, blood in stools, constipation, diarrhea, nausea, vomiting, or early satiety. Denies pain with intercourse, dysuria, frequency, hematuria or incontinence. Denies hot flashes, pelvic pain, vaginal bleeding or vaginal discharge.   Denies joint pain, back pain or muscle pain/cramps. Denies itching, rash, or wounds. Denies dizziness, headaches, numbness or seizures. Denies swollen lymph nodes or glands, denies easy bruising or bleeding. Denies anxiety, depression, confusion, or decreased concentration.  Physical Exam: BP (!) 177/69 (BP Location: Left Arm, Patient Position: Sitting)   Pulse 69   Temp 97.8 F (36.6 C) (Oral)   Resp 16   Ht '5\' 5"'$  (1.651 m)   Wt 153 lb (69.4 kg)   SpO2 100%   BMI 25.46 kg/m  General: Alert, oriented, no acute distress.  Lymphatics: No cervical, supraclavicular, or inguinal adenopathy. GU: Well-healing surgical incision.  Some granulation tissue noted along the most medial aspect of the incision.  No erythema or friability of this 1 cm area of granulation tissue.   Within the vagina, along the left posterior wall, there is a 1-2 cm area of somewhat vesicular appearing tissue concerning for possible dysplasia versus malignancy.  This is new since recent surgery.  Vaginal biopsy procedure Preoperative diagnosis: history of vulvar cancer, recent excision for recurrent disease Postoperative diagnosis: Same as above Physician: Berline Lopes MD Estimated blood loss: Minimal Specimens: vaginal biopsy Procedure: After the procedure was discussed with the patient including risks and benefits, she gave verbal consent.  She was then placed in dorsolithotomy position.  The labia was held open and the area to be biopsied along the distal posterior vagina was identified.  It was cleansed with Betadine x 3.  Approximately 2 cc of 2% lidocaine was injected for local anesthesia.  Tischler forceps were then used to biopsy this area.  This was placed in formalin.  Silver nitrate was used to achieve hemostasis.  Overall the patient tolerated the procedure well.  All instruments were removed from the vagina.  Laboratory & Radiologic Studies: None new  Assessment & Plan: JADORE PARSELLS is a 77 y.o. woman with history of endometrial cancer as well as a vulvar cancer, treated for multiple vulvar and vaginal recurrences, recently with recurrent disease at the vulva/vaginal introitus on the right, status postresection in early January with negative margins.  Patient is healed very well from surgery.  Discussed continued expectations and restrictions.  Unfortunately, there is a new area along the left distal vagina on exam today.  This was biopsied.  I will call the patient next week once biopsy results are back.  20 minutes of total time was spent for this patient encounter, including preparation, face-to-face counseling with the patient and coordination of care, and documentation of the encounter.  Jeral Pinch, MD  Division of Gynecologic Oncology  Department of Obstetrics and  Gynecology  [facility]

## 2022-04-08 ENCOUNTER — Telehealth: Payer: Self-pay | Admitting: Gynecologic Oncology

## 2022-04-08 LAB — SURGICAL PATHOLOGY

## 2022-04-08 NOTE — Telephone Encounter (Signed)
Called patient to discuss biopsy results. No answer. Left message requesting callback.  Jeral Pinch MD Gynecologic Oncology

## 2022-04-08 NOTE — Telephone Encounter (Signed)
Called the patient to discuss biopsy results. Given at least CIS, I recommend operative procedure with excision vs laser, possible additional biopsies. Patient leaving to go out of the country in 9 days. She will be back 3/16 and will be available after that date. I have asked my office to reach out to help schedule her for surgery.  Jeral Pinch MD Gynecologic Oncology

## 2022-04-09 ENCOUNTER — Encounter: Payer: Self-pay | Admitting: Gynecologic Oncology

## 2022-04-09 ENCOUNTER — Telehealth: Payer: Self-pay | Admitting: Oncology

## 2022-04-09 ENCOUNTER — Telehealth: Payer: Self-pay | Admitting: *Deleted

## 2022-04-09 NOTE — Telephone Encounter (Signed)
Patient called for her surgery date, explained that Secretary APP is in the OR and the message would be sent to her. Explained that the office would call hr back

## 2022-04-09 NOTE — Telephone Encounter (Signed)
Called Lateka and let her know that we are hoping to have 04/30/22 as her surgery date but are waiting for OR time to be available.  Also discussed that it may not be possible for her to be first case that day.  She is ok with this as long as it is not too late in the day.  Advised her we are looking for other dates as well and call her as soon as a time is available.

## 2022-04-11 ENCOUNTER — Telehealth: Payer: Self-pay | Admitting: Oncology

## 2022-04-11 NOTE — Telephone Encounter (Signed)
[name] and advised her that we are still working on a surgery time and are looking at 3/20, 3/21 and 3/27.  She prefers the earliest date possible after she returns on 3/16.  Advised her that we will let her know early next week before she leaves on her trip on 3/9.

## 2022-04-16 NOTE — Telephone Encounter (Signed)
[name] Richards call back and confirmed the surgery for 05/07/22.  She said if anything sooner opens up to leave her a message.  She will be back on 04/26/22.

## 2022-04-16 NOTE — Telephone Encounter (Signed)
Left a message advising that surgery has been scheduled for 05/07/22 at 11:00 at the [facility].  Advised we will call her if anything sooner opens up.

## 2022-05-03 ENCOUNTER — Ambulatory Visit: Payer: Self-pay | Admitting: Radiation Oncology

## 2022-05-05 ENCOUNTER — Encounter (HOSPITAL_BASED_OUTPATIENT_CLINIC_OR_DEPARTMENT_OTHER): Payer: Self-pay | Admitting: Gynecologic Oncology

## 2022-05-05 NOTE — Progress Notes (Signed)
Spoke w/ via phone for pre-op interview---  pt Lab needs dos----  [employer] results------ current EKG in epic/ chart COVID test -----patient states asymptomatic no test needed Arrive at -------  0915 on 05-07-2022 NPO after MN NO Solid Food.  Clear liquids from MN until--- 0815 Med rec completed Medications to take morning of surgery ----- synthroid, colace Diabetic medication ----- n/a Patient instructed no nail polish to be worn day of surgery Patient instructed to bring photo id and insurance card day of surgery Patient aware to have Driver (ride ) / caregiver    for 24 hours after surgery -- friend, brenda Patient Special Instructions ----- n/a Pre-Op special Istructions ----- n/a Patient verbalized understanding of instructions that were given at this phone interview. Patient denies shortness of breath, chest pain, fever, cough at this phone interview.

## 2022-05-06 ENCOUNTER — Telehealth: Payer: Self-pay

## 2022-05-06 NOTE — Telephone Encounter (Signed)
Telephone call to check on pre-operative status.  Patient compliant with pre-operative instructions.  Reinforced nothing to eat after midnight. Clear liquids until 0815. Patient to arrive at 0915.  No questions or concerns voiced.  Instructed to call for any needs.  °

## 2022-05-07 ENCOUNTER — Other Ambulatory Visit: Payer: Self-pay

## 2022-05-07 ENCOUNTER — Encounter (HOSPITAL_BASED_OUTPATIENT_CLINIC_OR_DEPARTMENT_OTHER): Payer: Self-pay | Admitting: Gynecologic Oncology

## 2022-05-07 ENCOUNTER — Ambulatory Visit (HOSPITAL_BASED_OUTPATIENT_CLINIC_OR_DEPARTMENT_OTHER)
Admission: RE | Admit: 2022-05-07 | Discharge: 2022-05-07 | Disposition: A | Discharge status: Home / Self Care | Payer: Medicare Other | Attending: Gynecologic Oncology | Admitting: Gynecologic Oncology

## 2022-05-07 ENCOUNTER — Encounter (HOSPITAL_BASED_OUTPATIENT_CLINIC_OR_DEPARTMENT_OTHER)
Admission: RE | Disposition: A | Discharge status: Home / Self Care | Payer: Self-pay | Source: Home / Self Care | Attending: Gynecologic Oncology

## 2022-05-07 ENCOUNTER — Ambulatory Visit (HOSPITAL_BASED_OUTPATIENT_CLINIC_OR_DEPARTMENT_OTHER): Payer: Medicare Other | Admitting: Anesthesiology

## 2022-05-07 DIAGNOSIS — C519 Malignant neoplasm of vulva, unspecified: Secondary | ICD-10-CM

## 2022-05-07 DIAGNOSIS — E039 Hypothyroidism, unspecified: Secondary | ICD-10-CM | POA: Insufficient documentation

## 2022-05-07 DIAGNOSIS — J45909 Unspecified asthma, uncomplicated: Secondary | ICD-10-CM

## 2022-05-07 DIAGNOSIS — Z90710 Acquired absence of both cervix and uterus: Secondary | ICD-10-CM | POA: Insufficient documentation

## 2022-05-07 DIAGNOSIS — I10 Essential (primary) hypertension: Secondary | ICD-10-CM | POA: Insufficient documentation

## 2022-05-07 DIAGNOSIS — Z905 Acquired absence of kidney: Secondary | ICD-10-CM | POA: Insufficient documentation

## 2022-05-07 DIAGNOSIS — R7303 Prediabetes: Secondary | ICD-10-CM | POA: Insufficient documentation

## 2022-05-07 DIAGNOSIS — C52 Malignant neoplasm of vagina: Secondary | ICD-10-CM | POA: Diagnosis not present

## 2022-05-07 DIAGNOSIS — Z90722 Acquired absence of ovaries, bilateral: Secondary | ICD-10-CM | POA: Diagnosis not present

## 2022-05-07 DIAGNOSIS — Z923 Personal history of irradiation: Secondary | ICD-10-CM | POA: Insufficient documentation

## 2022-05-07 DIAGNOSIS — C7982 Secondary malignant neoplasm of genital organs: Secondary | ICD-10-CM | POA: Diagnosis not present

## 2022-05-07 DIAGNOSIS — E785 Hyperlipidemia, unspecified: Secondary | ICD-10-CM | POA: Diagnosis not present

## 2022-05-07 DIAGNOSIS — M1990 Unspecified osteoarthritis, unspecified site: Secondary | ICD-10-CM | POA: Diagnosis not present

## 2022-05-07 DIAGNOSIS — Z01818 Encounter for other preprocedural examination: Secondary | ICD-10-CM

## 2022-05-07 DIAGNOSIS — C3412 Malignant neoplasm of upper lobe, left bronchus or lung: Secondary | ICD-10-CM | POA: Diagnosis not present

## 2022-05-07 DIAGNOSIS — Z8542 Personal history of malignant neoplasm of other parts of uterus: Secondary | ICD-10-CM | POA: Insufficient documentation

## 2022-05-07 DIAGNOSIS — Z79899 Other long term (current) drug therapy: Secondary | ICD-10-CM | POA: Insufficient documentation

## 2022-05-07 HISTORY — PX: VULVA /PERINEUM BIOPSY: SHX319

## 2022-05-07 HISTORY — DX: Nocturia: R35.1

## 2022-05-07 HISTORY — PX: CO2 LASER APPLICATION: SHX5778

## 2022-05-07 HISTORY — PX: VULVECTOMY: SHX1086

## 2022-05-07 HISTORY — DX: Hyperlipidemia, unspecified: E78.5

## 2022-05-07 HISTORY — DX: Other constipation: K59.09

## 2022-05-07 HISTORY — DX: Prediabetes: R73.03

## 2022-05-07 HISTORY — DX: Leukoplakia of vulva: N90.4

## 2022-05-07 LAB — POCT I-STAT, CHEM 8
BUN: 16 mg/dL (ref 8–23)
Calcium, Ion: 1.2 mmol/L (ref 1.15–1.40)
Chloride: 95 mmol/L — ABNORMAL LOW (ref 98–111)
Creatinine, Ser: 0.6 mg/dL (ref 0.44–1.00)
Glucose, Bld: 106 mg/dL — ABNORMAL HIGH (ref 70–99)
HCT: 39 % (ref 36.0–46.0)
Hemoglobin: 13.3 g/dL (ref 12.0–15.0)
Potassium: 3.8 mmol/L (ref 3.5–5.1)
Sodium: 133 mmol/L — ABNORMAL LOW (ref 135–145)
TCO2: 27 mmol/L (ref 22–32)

## 2022-05-07 SURGERY — EXAM UNDER ANESTHESIA
Anesthesia: General | Site: Vagina

## 2022-05-07 MED ORDER — ACETAMINOPHEN 500 MG PO TABS
ORAL_TABLET | ORAL | Status: AC
  Filled 2022-05-07: qty 2

## 2022-05-07 MED ORDER — LIDOCAINE HCL (PF) 2 % IJ SOLN
INTRAMUSCULAR | Status: AC
  Filled 2022-05-07: qty 5

## 2022-05-07 MED ORDER — ONDANSETRON HCL 4 MG/2ML IJ SOLN: 4.0000 mg | Freq: Four times a day (QID) | INTRAMUSCULAR | Status: DC | PRN

## 2022-05-07 MED ORDER — PROPOFOL 10 MG/ML IV BOLUS
INTRAVENOUS | Status: AC
  Filled 2022-05-07: qty 20

## 2022-05-07 MED ORDER — FENTANYL CITRATE (PF) 100 MCG/2ML IJ SOLN
INTRAMUSCULAR | Status: AC
  Filled 2022-05-07: qty 2

## 2022-05-07 MED ORDER — ESTRADIOL 0.1 MG/GM VA CREA: TOPICAL_CREAM | VAGINAL | 3 refills | Status: DC

## 2022-05-07 MED ORDER — SENNOSIDES-DOCUSATE SODIUM 8.6-50 MG PO TABS: 2.0000 | ORAL_TABLET | Freq: Every day | ORAL | 2 refills | Status: DC

## 2022-05-07 MED ORDER — BUPIVACAINE LIPOSOME 1.3 % IJ SUSP
INTRAMUSCULAR | Status: DC | PRN
  Administered 2022-05-07: 16.5 mL

## 2022-05-07 MED ORDER — BUPIVACAINE LIPOSOME 1.3 % IJ SUSP
INTRAMUSCULAR | Status: AC
  Filled 2022-05-07: qty 20

## 2022-05-07 MED ORDER — ONDANSETRON HCL 4 MG/2ML IJ SOLN
INTRAMUSCULAR | Status: AC
  Filled 2022-05-07: qty 2

## 2022-05-07 MED ORDER — LIDOCAINE 2% (20 MG/ML) 5 ML SYRINGE
INTRAMUSCULAR | Status: DC | PRN
  Administered 2022-05-07: 60 mg via INTRAVENOUS

## 2022-05-07 MED ORDER — PROPOFOL 10 MG/ML IV BOLUS
INTRAVENOUS | Status: DC | PRN
  Administered 2022-05-07: 20 mg via INTRAVENOUS
  Administered 2022-05-07: 120 mg via INTRAVENOUS
  Administered 2022-05-07: 20 mg via INTRAVENOUS

## 2022-05-07 MED ORDER — ONDANSETRON HCL 4 MG PO TABS: 4.0000 mg | ORAL_TABLET | Freq: Four times a day (QID) | ORAL | Status: DC | PRN

## 2022-05-07 MED ORDER — HEMOSTATIC AGENTS (NO CHARGE) OPTIME
TOPICAL | Status: DC | PRN
  Administered 2022-05-07: 1 via TOPICAL

## 2022-05-07 MED ORDER — FENTANYL CITRATE (PF) 100 MCG/2ML IJ SOLN: 25.0000 ug | INTRAMUSCULAR | Status: DC | PRN

## 2022-05-07 MED ORDER — TRAMADOL HCL 50 MG PO TABS: 50.0000 mg | ORAL_TABLET | Freq: Four times a day (QID) | ORAL | Status: DC | PRN

## 2022-05-07 MED ORDER — ACETAMINOPHEN 500 MG PO TABS
1000.0000 mg | ORAL_TABLET | ORAL | Status: AC
  Administered 2022-05-07: 1000 mg via ORAL

## 2022-05-07 MED ORDER — TRAMADOL HCL 50 MG PO TABS: 50.0000 mg | ORAL_TABLET | Freq: Four times a day (QID) | ORAL | 0 refills | Status: DC | PRN

## 2022-05-07 MED ORDER — ONDANSETRON HCL 4 MG/2ML IJ SOLN: 4.0000 mg | Freq: Once | INTRAMUSCULAR | Status: DC | PRN

## 2022-05-07 MED ORDER — LIDOCAINE HCL (PF) 1 % IJ SOLN
INTRAMUSCULAR | Status: DC | PRN
  Administered 2022-05-07: 16.5 mL

## 2022-05-07 MED ORDER — SILVER SULFADIAZINE 1 % EX CREA
TOPICAL_CREAM | CUTANEOUS | Status: DC | PRN
  Administered 2022-05-07: 1 via TOPICAL

## 2022-05-07 MED ORDER — DEXAMETHASONE SODIUM PHOSPHATE 10 MG/ML IJ SOLN
INTRAMUSCULAR | Status: DC | PRN
  Administered 2022-05-07: 5 mg via INTRAVENOUS

## 2022-05-07 MED ORDER — FENTANYL CITRATE (PF) 100 MCG/2ML IJ SOLN
INTRAMUSCULAR | Status: DC | PRN
  Administered 2022-05-07 (×2): 50 ug via INTRAVENOUS

## 2022-05-07 MED ORDER — DEXAMETHASONE SODIUM PHOSPHATE 10 MG/ML IJ SOLN
INTRAMUSCULAR | Status: AC
  Filled 2022-05-07: qty 1

## 2022-05-07 MED ORDER — LACTATED RINGERS IV SOLN: INTRAVENOUS | Status: DC

## 2022-05-07 MED ORDER — ONDANSETRON HCL 4 MG/2ML IJ SOLN
INTRAMUSCULAR | Status: DC | PRN
  Administered 2022-05-07: 4 mg via INTRAVENOUS

## 2022-05-07 MED ORDER — DEXAMETHASONE SODIUM PHOSPHATE 10 MG/ML IJ SOLN: 4.0000 mg | INTRAMUSCULAR | Status: DC

## 2022-05-07 SURGICAL SUPPLY — 40 items
BLADE CLIPPER SENSICLIP SURGIC (BLADE) IMPLANT
BLADE SURG 11 STRL SS (BLADE) IMPLANT
BLADE SURG 15 STRL LF DISP TIS (BLADE) ×2 IMPLANT
BLADE SURG 15 STRL SS (BLADE) ×2
CATH ROBINSON RED A/P 16FR (CATHETERS) ×2 IMPLANT
DEPRESSOR TONGUE 6 IN STERILE (GAUZE/BANDAGES/DRESSINGS) ×2 IMPLANT
DRSG TELFA 3X8 NADH STRL (GAUZE/BANDAGES/DRESSINGS) IMPLANT
ELECT BALL LEEP 3MM BLK (ELECTRODE) IMPLANT
GAUZE 4X4 16PLY ~~LOC~~+RFID DBL (SPONGE) ×2 IMPLANT
GLOVE BIO SURGEON STRL SZ 6 (GLOVE) ×4 IMPLANT
GOWN STRL REUS W/TWL LRG LVL3 (GOWN DISPOSABLE) ×2 IMPLANT
HEMOSTAT SURGICEL 4X8 (HEMOSTASIS) IMPLANT
KIT TURNOVER CYSTO (KITS) ×2 IMPLANT
NDL HYPO 25X1 1.5 SAFETY (NEEDLE) ×2 IMPLANT
NEEDLE HYPO 25X1 1.5 SAFETY (NEEDLE) ×2 IMPLANT
NS IRRIG 500ML POUR BTL (IV SOLUTION) ×2 IMPLANT
PACK PERINEAL COLD (PAD) ×2 IMPLANT
PACK VAGINAL WOMENS (CUSTOM PROCEDURE TRAY) ×2 IMPLANT
PAD PREP 24X48 CUFFED NSTRL (MISCELLANEOUS) ×2 IMPLANT
PUNCH BIOPSY DERMAL 3 (INSTRUMENTS) IMPLANT
PUNCH BIOPSY DERMAL 3MM (INSTRUMENTS)
PUNCH BIOPSY DERMAL 4MM (INSTRUMENTS) IMPLANT
SLEEVE SCD COMPRESS KNEE MED (STOCKING) ×2 IMPLANT
SUT VIC AB 0 CT1 36 (SUTURE) IMPLANT
SUT VIC AB 0 SH 27 (SUTURE) ×2 IMPLANT
SUT VIC AB 2-0 CT2 27 (SUTURE) IMPLANT
SUT VIC AB 2-0 SH 27 (SUTURE) ×4
SUT VIC AB 2-0 SH 27XBRD (SUTURE) IMPLANT
SUT VIC AB 3-0 PS2 18 (SUTURE)
SUT VIC AB 3-0 PS2 18XBRD (SUTURE) IMPLANT
SUT VIC AB 3-0 SH 27 (SUTURE) ×2
SUT VIC AB 3-0 SH 27X BRD (SUTURE) ×2 IMPLANT
SUT VIC AB 4-0 PS2 18 (SUTURE) ×2 IMPLANT
SUT VICRYL 0 UR6 27IN ABS (SUTURE) IMPLANT
SWAB OB GYN 8IN STERILE 2PK (MISCELLANEOUS) ×4 IMPLANT
SYR BULB IRRIG 60ML STRL (SYRINGE) ×2 IMPLANT
TOWEL OR 17X24 6PK STRL BLUE (TOWEL DISPOSABLE) ×4 IMPLANT
TUBE CONNECTING 12X1/4 (SUCTIONS) IMPLANT
VACUUM HOSE 7/8X10 W/ WAND (MISCELLANEOUS) IMPLANT
WATER STERILE IRR 500ML POUR (IV SOLUTION) ×2 IMPLANT

## 2022-05-07 NOTE — Transfer of Care (Signed)
Immediate Anesthesia Transfer of Care Note  [name]  Procedure(s) Performed: Jasmine December UNDER ANESTHESIA (Perineum) POSSIBLE VAGINAL  BIOPSY (Vagina ) POSSIBLE WIDE EXCISION VULVECTOMY/VAGINA (Perineum) POSSIBLE CO2 LASER APPLICATION TO VAGINA/VULVA (Perineum)  Patient Location: PACU  Anesthesia Type:General  Level of Consciousness: drowsy and responds to stimulation  Airway & Oxygen Therapy: Patient Spontanous Breathing and Patient connected to nasal cannula oxygen  Post-op Assessment: Report given to RN and Post -op Vital signs reviewed and stable  Post vital signs: Reviewed and stable  Last Vitals:  Vitals Value Taken Time  BP 133/70 05/07/22 1336  Temp 36.4 C 05/07/22 1336  Pulse 50 05/07/22 1339  Resp 12 05/07/22 1339  SpO2 95 % 05/07/22 1339  Vitals shown include unvalidated device data.  Last Pain:  Vitals:   05/07/22 0950  TempSrc: Oral  PainSc: 0-No pain      Patients Stated Pain Goal: 4 ([id number])  Complications: No notable events documented.

## 2022-05-07 NOTE — Brief Op Note (Signed)
05/07/2022  2:10 PM  PATIENT:  [name] Richards  77 y.o. female  PRE-OPERATIVE DIAGNOSIS:  RECURRENT VULVAR CANCER  POST-OPERATIVE DIAGNOSIS:  RECURRENT VULVAR CANCER  PROCEDURE:  Procedure(s): EXAM UNDER ANESTHESIA (N/A) POSSIBLE VAGINAL  BIOPSY (N/A) POSSIBLE WIDE EXCISION VULVECTOMY/VAGINA (N/A) POSSIBLE CO2 LASER APPLICATION TO VAGINA/VULVA (N/A)  SURGEON:  Surgeon(s) and Role:    [name], MD - Primary   ANESTHESIA:   general  EBL:  20 mL   BLOOD ADMINISTERED:none  DRAINS: none   LOCAL MEDICATIONS USED:  lidocaine with exparel  SPECIMEN:  distal right vaginal lesion, left vaginal sidewall lesion, apical vaginal lesion, posterior vaginal lesion, right vulvar biopsy 2 o'c, left vulvar biopsy 10 o'c, posterior vulvar biopsy 5 o'c  DISPOSITION OF SPECIMEN:  PATHOLOGY  COUNTS:  YES  TOURNIQUET:  * No tourniquets in log *  DICTATION: .Note written in EPIC  PLAN OF CARE: Discharge to home after PACU  PATIENT DISPOSITION:  PACU - hemodynamically stable.   Delay start of Pharmacological VTE agent (>24hrs) due to surgical blood loss or risk of bleeding: not applicable

## 2022-05-07 NOTE — Anesthesia Procedure Notes (Signed)
Procedure Name: LMA Insertion Date/Time: 05/07/2022 12:50 PM  Performed by: Rogers Blocker, CRNAPre-anesthesia Checklist: Patient identified, Emergency Drugs available, Suction available and Patient being monitored Patient Re-evaluated:Patient Re-evaluated prior to induction Oxygen Delivery Method: Circle System Utilized Preoxygenation: Pre-oxygenation with 100% oxygen Induction Type: IV induction Ventilation: Mask ventilation without difficulty LMA: LMA inserted LMA Size: 4.0 Number of attempts: 1 Airway Equipment and Method: Bite block Placement Confirmation: positive ETCO2 Tube secured with: Tape Dental Injury: Teeth and Oropharynx as per pre-operative assessment

## 2022-05-07 NOTE — H&P (Signed)
Gynecologic Oncology H&P  05/07/22  Treatment History: Oncology History Overview Note  Patient followed due to HX pulmonary nodules noted 2011  Primary cancer of left upper lobe of lung ([place])   Staging form: Lung, AJCC 7th Edition   - Clinical stage from 09/27/2015: Stage IA (T1b, N0, M0) - Signed by Curt Bears, MD on 09/27/2015    Primary cancer of left upper lobe of lung ([place])  07/12/2015 Imaging   CT Chest IMPRESSION:  The solid component of the large part solid nodule in the left upper lobe measures 9 x 3 mm (mean diameter 6 mm.) persistent part solid nodules with solid components greater than or equal to 6 mm should be considered highly suspicious for pulmonary adenocarcinoma   08/01/2015 Initial Diagnosis   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 - THE SURGICAL RESECTION MARGINS ARE NEGATIVE FOR CARCINOMA. 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 She underwent a total abdominal hysterectomy and bilateral salpingo-oophorectomy, pelvic and para-aortic lymphadenectomy. She received postoperative whole pelvis radiation therapy.  Her other primary gynecologic problem has been lichen sclerosus managed with when necessary clobetasol.   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   In December 2016 the patient was found to have a  new primary squamous cell carcinoma the vulva undergoing a modified radical vulvectomy on 02/06/2015. She underwent a modified radical vulvectomy at [facility] on 02/06/2015. She was found to have a 3 cm vulvar lesion of the posterior vulva extending into the posterior vagina. A rhomboid flap was required to close the incision.  Final pathology showed some close surgical margins. However, the specimen margins were toward the time of surgery and it was Dr Mora Bellman impression that they were widely around the primary cancer. She discussed options with the patient and they agreed to monitor her closely but not recommend any radiation therapy to the vulva.   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  Depth of invasion was 4 mm all margins were negative.  Follow-up PET scan March 23, 2017 showed no evidence of metastatic disease.   Examination of the vulva during routine surveillance on 07/20/19 showed a friable, verrucous lesion measuring approximately 2 cm was seen on the anterior wall of the suburethral vagina extending to the introitus and wrapping around to the right mid vagina. This was concerning for recurrence and was biopsied at that appointment. 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    On 08/11/19 she underwent total vaginectomy. Intraoperative findings were significant for a forshortened 3cm vagina, friable 4cm plaque on the anterior vagina extending to the right fornix and left posterior vaginal wall. This necessitated a total  vaginectomy to resect the lesion grossly. Her tissues were extremely friable due to age, prior radiation, menopause and lichen sclerosis. There was unavoidable fragmentation of the tissues during the resection. All gross visible disease was removed. Surgery was uncomplicated, and she reported no postop pain.  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 The suspicious foci are present at the edges of tissue fragments.  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    Post-op PET scan in August, 2021 showed no metastatic disease.   The patient received adjuvant vaginal brachytherapy between 09/29/2019 through 10/12/2019.  She tolerated treatment very well with no toxicities.   I saw the patient on 12/28.  At the time of her visit, I was concerned for dysplastic changes versus radiation treatment within the vagina and on her vulva.   On 02/14/2021, the patient underwent exam under anesthesia, vaginal and vulvar biopsies.  Findings were notable for anatomic changes related to prior surgical treatment and radiation.  Significant atrophy was noted.  Multiple spots of hyperkeratosis versus acetowhite after application of acetic acid were found, all biopsied.  Final pathology revealed invasive squamous cell carcinoma at 5:00 of the vaginal biopsy.  Multiple other biopsies including a 1:00 periclitoral biopsy and apex of the vagina showed low-grade squamous intraepithelial lesion.  Biopsy from 7:00 within the vagina showed high-grade squamous intraepithelial lesion. Submitted biopsies are noted to be very small, superficial, and oriented.  Most of them have dysplasia or carcinoma extended to the lateral edges but without involvement of the base of the biopsy.  The one biopsy showing carcinoma is 1 mm thick and described as extending to the edge of the biopsy where the lesion is transected and therefore measurement likely does not accurately represent the final thickness.   PET scan was performed on 02/28/2021.  There was noted to be interval decrease in degree of FDG uptake within the vaginal introitus compared to PET scan in 2021 after her last surgery.   No concerning findings for metastatic disease.  Comment was noted that there is mild uptake within a left inguinal lymph node but the morphology is benign.  Imaging was reviewed at our tumor conference on 1/23 with discussion being findings did not represent metastatic disease.   03/20/21: Vaginectomy, CO2 laser of the vulva for recurrent vaginal cancer, high-grade vulvar dysplasia.  Vaginal excision showed squamous mucosa with ulceration and inflammation.  No malignancy or dysplasia identified.  Rare atypical "radiation" fibroblast embedded.   01/2022: Developed new vulvar lesion concerning for at least high-grade dysplasia, malignancy suspected.   02/19/2022: Partial simple right posterior vulvectomy with rotational rhomboid flap.  Final pathology revealed recurrent grade 2 invasive squamous cell carcinoma, depth of invasion 4 mm, margins negative.   03/13/2022: PET/CT shows focal hypermetabolism in the region of the vaginal introitus.  Left groin node seen on prior study is stable to minimally increased in size with low-level hypermetabolism.  No new sites of hypermetabolic disease.  On review with radiologist, given no treatment in the year interval since prior study, left groin node is less suspicious for active disease, consistent with reactive etiology.  Changes in the vaginal introitus region thought to represent either postoperative change or urinary contamination.  Interval History: No changes since last visit. Continued vulvar irritation.  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 cath due to past injury   History of benign kidney tumor excluding renal pelvis 07/26/2009   s/p  right partial nephrectomy for angiomyolipoma    History of cervical cancer 03/1995   s/p   TAH /  BSO for poorly differentiated cervical carcinoma.   History of endometrial cancer 03/1995   s/p  TAH/ BSO for cervical cancer w/ incidental finding endometrial cancer also ,  completed radiation 11/ 1997   History of external beam radiation therapy 1997   completed abominal radation for endometrial cancer 11/ 1997   History of radiation therapy 10/12/2019   [facility] HDR brachytherapy  09/29/2019-10/12/2019  Dr Gery Pray  (recurrent VAIN)   History of resection of meningioma 11/1968   T12   History of small bowel obstruction 12/2008   partial SBO admission 12/2008;  recurrent SBO in 02/2011;  02/ 2017;   so far no surgical intervention (per pt has a narrowed distal colon)   Hyperlipidemia    Hypertension    Hypothyroidism    followed by pcp   Lichen sclerosus of vulva    per pt w/ chronic itching   Nocturia    Pre-diabetes    Primary cancer of left upper lobe of lung ([place]) 08/01/2015   previously followed by oncologist--- dr Mable Fill--- released 09-13-2018  , pt non-smokerStage 1  Non-small cell ,  no chemo or radiation,  survillance   (bilateral nodules for noted in 2011)   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   GYN oncologist--- dr Berline Lopes---- total vaginectomy and completed radiation 09/ 2021;    laser ablation 02/ 2023   Recurrent vulvar cancer [facility]) 2016   GYN oncologist---- dr Berline Lopes;   new dx 12/ 2016  s/p radica vulvectomy @ [facility] by dr Fermin Schwab recurrent 10/ 2018 s/p modified vulvecotmy;  06/ 2021 invasive to vagina/ recurrent vulva s/p total vagainectomy & radiation; laser ablation 02/ 2023;   new lesion 12/ 2023 s/p simplec vulectomy w/ flap 01/ 2024    Past Surgical History:  Procedure Laterality Date   CATARACT EXTRACTION W/ INTRAOCULAR LENS IMPLANT Bilateral [id number]   CO2 LASER APPLICATION N/A [id number]   Procedure: CO2 LASER APPLICATION to vagina and vuvla;  Surgeon: [name], MD;  Location: [facility];  Service: Gynecology;  Laterality: N/A;   LYMPH NODE DISSECTION Left 08/01/2015   Procedure: LYMPH NODE DISSECTION;  Surgeon: Melrose Nakayama, MD;  Location: [place];  Service: Thoracic;  Laterality: Left;   RADICAL VULVECTOMY  02/06/2015   @[facility]  by dr Fermin Schwab   ROBOTIC ASSITED PARTIAL NEPHRECTOMY  07/26/2009   @[facility]  by dr Alinda Money   SEGMENTECOMY Left 08/01/2015   Procedure: Left Upper Lobe SEGMENTECTOMY;  Surgeon: Melrose Nakayama, MD;  Location: [place];  Service: Thoracic;  Laterality: Left;   TONSILLECTOMY AND ADENOIDECTOMY     child   TOTAL ABDOMINAL HYSTERECTOMY W/ BILATERAL SALPINGOOPHORECTOMY  03/31/1995   @[facility]  by dr Fermin Schwab;   TAH/BSO pelvic and periaortic lymphadenectomy   TUBAL LIGATION Bilateral    yrs ago   TUMOR REMOVAL  11/1968   meningioma  T 12   VAGINECTOMY, PARTIAL N/A 08/11/2019   Procedure: TOTAL VAGINECTOMY;  Surgeon: Denman George,  Terrence Dupont, MD;  Location: [facility];  Service: Gynecology;  Laterality: N/A;   VAGINECTOMY, PARTIAL N/A 03/20/2021   Procedure: vaginal excision;  Surgeon: [name], MD;  Location: [facility];  Service: Gynecology;  Laterality: N/A;   VIDEO ASSISTED THORACOSCOPY (VATS)/WEDGE RESECTION Left 08/01/2015   Procedure: VIDEO ASSISTED THORACOSCOPY (VATS)/WEDGE RESECTION;  Surgeon: Melrose Nakayama, MD;  Location: [place];  Service: Thoracic;  Laterality: Left;   VULVA Milagros Loll BIOPSY N/A 02/14/2021   Procedure: VAGINAL AND VULVAR BIOPSY;  Surgeon: [name], MD;  Location: [facility];  Service: Gynecology;  Laterality: N/A;   VULVAR LESION REMOVAL N/A 02/19/2022   Procedure: VULVAR LESION EXCISION, ROTATIONAL FLAP CLOSURE;  Surgeon: [name], MD;  Location: [facility];  Service: Gynecology;  Laterality: N/A;   VULVECTOMY PARTIAL  01/13/2017   @[facility]  by dr Fermin Schwab    Family History  Problem Relation Age of Onset   Hypertension Other    Diabetes Other    Stroke  Other    CAD Other    Colon cancer Neg Hx    Breast cancer Neg Hx    Ovarian cancer Neg Hx    Endometrial cancer Neg Hx    Pancreatic cancer Neg Hx    Prostate cancer Neg Hx     Social History   Socioeconomic History   Marital status: Divorced    Spouse name: Not on file   Number of children: Not on file   Years of education: Not on file   Highest education level: Not on file  Occupational History   Not on file  Tobacco Use   Smoking status: Never   Smokeless tobacco: Never  Vaping Use   Vaping Use: Never used  Substance and Sexual Activity   Alcohol use: Not Currently    Comment: Occ   Drug use: Never   Sexual activity: Yes    Birth control/protection: Surgical  Other Topics Concern   Not on file  Social History Narrative   Lives at home alone, works out at [employer] 6 days a week, still very active. Has an ex-husband who she is in contact with. Son died of an MI and another son in [place].    Social Determinants of Health   Financial Resource Strain: Not on file  Food Insecurity: Not on file  Transportation Needs: Not on file  Physical Activity: Not on file  Stress: Not on file  Social Connections: Not on file    Current Medications:  Current Facility-Administered Medications:    dexamethasone (DECADRON) injection 4 mg, 4 mg, Intravenous, On Call to OR, Cross, Melissa D, NP   lactated ringers infusion, , Intravenous, Continuous, Woodrum, Chelsey L, MD, Last Rate: 50 mL/hr at 05/07/22 1203, Continued from Pre-op at 05/07/22 1203  Review of Systems: Denies appetite changes, fevers, chills, fatigue, unexplained weight changes. Denies hearing loss, neck lumps or masses, mouth sores, ringing in ears or voice changes. Denies cough or wheezing.  Denies shortness of breath. Denies chest pain or palpitations. Denies leg swelling. Denies abdominal distention, pain, blood in stools, constipation, diarrhea, nausea, vomiting, or early satiety. Denies pain with  intercourse, dysuria, frequency, hematuria or incontinence. Denies hot flashes, pelvic pain, vaginal bleeding or vaginal discharge.   Denies joint pain, back pain or muscle pain/cramps. Denies itching, rash, or wounds. Denies dizziness, headaches, numbness or seizures. Denies swollen lymph nodes or glands, denies easy bruising or bleeding. Denies anxiety, depression, confusion, or decreased concentration.  Physical  Exam: BP (!) 172/76   Pulse 64   Temp 97.6 F (36.4 C) (Oral)   Resp 16   Ht 5\' 5"  (1.651 m)   Wt 155 lb 8 oz (70.5 kg)   SpO2 97%   BMI 25.88 kg/m  General: Alert, oriented, no acute distress. HEENT: Posterior oropharynx clear, sclera anicteric. Chest: Clear to auscultation bilaterally.  No wheezes or rhonchi. Cardiovascular: Regular rate and rhythm, no murmurs. Extremities: Grossly normal range of motion.  Warm, well perfused.  No edema bilaterally. Lymphatics: No cervical, supraclavicular, or inguinal adenopathy. GU: Well-healing surgical incision.  Some granulation tissue noted along the most medial aspect of the incision.  No erythema or friability of this 1 cm area of granulation tissue.  Within the vagina, along the left posterior wall, there is a 1-2 cm area of somewhat vesicular appearing tissue concerning for possible dysplasia versus malignancy.  This is new since recent surgery.  Laboratory & Radiologic Studies: 04/04/22: A. VAGINA, BIOPSY:  Squamous cell carcinoma in situ  Focally suspicious invasion   Assessment & Plan: [name] Richards is a 77 y.o. woman with recurrent vaginal cancer.  Plan for definitive treatment today, excision, possible laser ablation, possible biopsies.  Jeral Pinch, MD  Division of Gynecologic Oncology  Department of Obstetrics and Gynecology  [facility]

## 2022-05-07 NOTE — Anesthesia Preprocedure Evaluation (Addendum)
Anesthesia Evaluation  Patient identified by MRN, date of birth, ID band Patient awake    Reviewed: Allergy & Precautions, NPO status , Patient's Chart, lab work & pertinent test results  History of Anesthesia Complications Negative for: history of anesthetic complications  Airway Mallampati: II  TM Distance: >3 FB Neck ROM: Full    Dental  (+) Dental Advisory Given   Pulmonary asthma   Hx lung cancer     Pulmonary exam normal        Cardiovascular hypertension, Pt. on medications Normal cardiovascular exam     Neuro/Psych negative neurological ROS  negative psych ROS   GI/Hepatic negative GI ROS, Neg liver ROS,,,  Endo/Other  Hypothyroidism   Pre-DM   Renal/GU  Angiomyolipoma s/p partial right nephrectomy       Musculoskeletal  (+) Arthritis ,    Abdominal   Peds  Hematology negative hematology ROS (+)   Anesthesia Other Findings   Reproductive/Obstetrics  Vulvar cancer                              Anesthesia Physical Anesthesia Plan  ASA: 3  Anesthesia Plan: General   Post-op Pain Management: Tylenol PO (pre-op)*   Induction: Intravenous  PONV Risk Score and Plan: 3 and Treatment may vary due to age or medical condition, Ondansetron and Propofol infusion  Airway Management Planned: LMA  Additional Equipment: None  Intra-op Plan:   Post-operative Plan: Extubation in OR  Informed Consent: I have reviewed the patients History and Physical, chart, labs and discussed the procedure including the risks, benefits and alternatives for the proposed anesthesia with the patient or authorized representative who has indicated his/her understanding and acceptance.     Dental advisory given  Plan Discussed with: CRNA and Anesthesiologist  Anesthesia Plan Comments:        Anesthesia Quick Evaluation

## 2022-05-07 NOTE — Discharge Instructions (Addendum)
AFTER SURGERY INSTRUCTIONS   Return to work:  variable   You may notice a piece of gauze like-material come out of the vagina over the next several days. This is NORMAL and is used during surgery to help stop bleeding in the vagina.   We recommend purchasing several bags of frozen green peas and dividing them into ziploc bags. You will want to keep these in the freezer and have them ready to use as ice packs to the vulvar incision. Once the ice pack is no longer cold, you can get another from the freezer. The frozen peas mold to your body better than a regular ice pack.   You can use the silvadene cream (burn cream) at the entrance of the vagina twice daily.   At bedtime, plan to begin using vaginal estrogen. Plan to beginning to use vaginal estrogen cream nightly at bedtime for 2 weeks then three times a week after to assist with healing in the vagina. You will need to use a fingertip size amount of cream on your finger and place slightly past the vaginal entrance at bedtime. Do not use the applicator if this comes with the cream.     Activity: 1. Be up and out of the bed during the day.  Take a nap if needed.  You may walk up steps but be careful and use the hand rail.  Stair climbing will tire you more than you think, you may need to stop part way and rest.    2. No lifting or straining for 2 weeks minimum over 10 pounds. No pushing, pulling, straining for 2 weeks.   3. No driving for minimum 24 hours after surgery.  Do not drive if you are taking narcotic pain medicine and make sure that your reaction time has returned.    4. You can shower as soon as the next day after surgery. Shower daily. No tub baths or submerging your body in water until cleared by your surgeon. If you have the soap that was given to you by pre-surgical testing that was used before surgery, you do not need to use it afterwards because this can irritate your incisions.    5. No sexual activity and nothing in the vagina  for 4 weeks.   6. You may experience vaginal spotting and discharge after surgery.  The spotting is normal but if you experience heavy bleeding, call our office.   7. Take Tylenol or ibuprofen for pain if you are able to take these medications.  Monitor your Tylenol intake to a max of 4,000 mg in a 24 hour period. You can alternate these medications after surgery.   Diet: 1. Low sodium Heart Healthy Diet is recommended but you are cleared to resume your normal (before surgery) diet after your procedure.   2. It is safe to use a laxative, such as Miralax or Colace, if you have difficulty moving your bowels.    Wound Care: 1. Keep clean and dry.  Shower daily.   Reasons to call the Doctor: Fever - Oral temperature greater than 100.4 degrees Fahrenheit Foul-smelling vaginal discharge Difficulty urinating Nausea and vomiting Increased pain at the site of the incision that is unrelieved with pain medicine. Difficulty breathing with or without chest pain New calf pain especially if only on one side Sudden, continuing increased vaginal bleeding with or without clots.   Contacts: For questions or concerns you should contact:   Dr. Jeral Pinch at [place], NP at  [phone]   After Hours: call [phone] and have the GYN Oncologist paged/contacted (after 5 pm or on the weekends).   Messages sent via mychart are for non-urgent matters and are not responded to after hours so for urgent needs, please call the after hours number.    Post Anesthesia Home Care Instructions  Activity: Get plenty of rest for the remainder of the day. A responsible individual must stay with you for 24 hours following the procedure.  For the next 24 hours, DO NOT: -Drive a car -Paediatric nurse -Drink alcoholic beverages -Take any medication unless instructed by your physician -Make any legal decisions or sign important papers.  Meals: Start with liquid foods such as gelatin  or soup. Progress to regular foods as tolerated. Avoid greasy, spicy, heavy foods. If nausea and/or vomiting occur, drink only clear liquids until the nausea and/or vomiting subsides. Call your physician if vomiting continues.  Special Instructions/Symptoms: Your throat may feel dry or sore from the anesthesia or the breathing tube placed in your throat during surgery. If this causes discomfort, gargle with warm salt water. The discomfort should disappear within 24 hours.  Information for Discharge Teaching: EXPAREL (bupivacaine liposome injectable suspension)   Your surgeon or anesthesiologist gave you EXPAREL(bupivacaine) to help control your pain after surgery.  EXPAREL is a local anesthetic that provides pain relief by numbing the tissue around the surgical site. EXPAREL is designed to release pain medication over time and can control pain for up to 72 hours. Depending on how you respond to EXPAREL, you may require less pain medication during your recovery.  Possible side effects: Temporary loss of sensation or ability to move in the area where bupivacaine was injected. Nausea, vomiting, constipation Rarely, numbness and tingling in your mouth or lips, lightheadedness, or anxiety may occur. Call your doctor right away if you think you may be experiencing any of these sensations, or if you have other questions regarding possible side effects.  Follow all other discharge instructions given to you by your surgeon or nurse. Eat a healthy diet and drink plenty of water or other fluids.  If you return to the hospital for any reason within 96 hours following the administration of EXPAREL, it is important for health care providers to know that you have received this anesthetic. A teal colored band has been placed on your arm with the date, time and amount of EXPAREL you have received in order to alert and inform your health care providers. Please leave this armband in place for the full 96 hours  following administration, and then you may remove the band.  Leave green armband in place until Sunday, May 11, 2022.    Call your surgeon if you experience:   1.  Fever over 101.0. 2.  Inability to urinate. 3.  Nausea and/or vomiting. 4.  Extreme swelling or bruising at the surgical site. 5.  Continued bleeding from the incision. 6.  Increased pain, redness or drainage from the incision. 7.  Problems related to your pain medication. 8.  Any problems and/or concerns. 9. Pads only for any vaginal drainage. 10. Do no sit in bathtub, hot tub, swimming pools, etc.

## 2022-05-07 NOTE — Op Note (Signed)
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  Approximately 1 x 1 cm raised mass along the right distal vagina from 6-8 o'clock.  Second raised mass along the left vaginal sidewall extending almost up to the vaginal apex measuring approximately 1 x 1.5 cm.  Several small, less than 5 mm, masses along the vaginal apex and superior wall of the posterior vagina.  Well-healed lumbar incision from recent surgery.  Significant loss of architecture of the vulva with erythema noted of bilateral anterior labia and along the perineum.  Multiple biopsies taken including 2 and 10:00 along the vulva and at 5:00 along the posterior vulva.   Procedure: The patient was identified in the preoperative holding area. Informed consent was signed on the chart. Patient was seen history was reviewed and exam was performed.    The patient was then taken to the operating room and placed in the supine position with SCD hose on. General anesthesia was then induced without difficulty. She was then placed in the dorsolithotomy position. The perineum was prepped with Betadine. The vagina was prepped with Betadine. The patient was then draped after the prep was dried.    Timeout was performed the patient, procedure, antibiotic, allergy, and length of procedure. The vagina and vulva were  inspected with findings noted above and monopolar electrocautery was used to outlined planned areas for resection within the vagina. Monopolar electrocautery was used to incise the epithelium and Allis clamps were used to grasp the epithelium to manipulate the tissue, taking care not to excise too deeply. Once the distal portion of the posterior vagina had been excised, the left vaginal lesion and apical lesions were excised in a similar manner. Monopolar electrocautery was used to achieve hemostasis. Multiple rectal exams were performed during the excision to assure anus and rectum intact and unharmed (gloves were changed after each exam).  Given shortened vagina and multiple lesions involved, all of the left vaginal sidewall, posterior vagina, and distal portion of the right vaginal sidewall had been cauterized.  Although no direct involvement, along the posterior aspect of the vagina, I resected down to the endopelvic fascia.  In the small area where the rectovaginal septum felt mildly thinner, several figure-of-eight sutures with 2-0 Vicryl were placed to reinforce this area.   The vagina was irrigated.  Excellent hemostasis was noted.  Silvadene cream was applied to the vagina.  Surgicel was then placed within the vaginal opening.   Multiple vulvar biopsies were taken with Tischler forceps.  Exparel with 1% lidocaine was then injected for local anesthesia.   All instrument, suture, laparotomy, Ray-Tec, and needle counts were correct x2. The patient tolerated the procedure well and was taken recovery room in stable condition.   Jeral Pinch MD Gynecologic Oncology

## 2022-05-08 ENCOUNTER — Telehealth: Payer: Self-pay | Admitting: Surgery

## 2022-05-08 ENCOUNTER — Telehealth: Payer: Self-pay | Admitting: Gynecologic Oncology

## 2022-05-08 ENCOUNTER — Encounter (HOSPITAL_BASED_OUTPATIENT_CLINIC_OR_DEPARTMENT_OTHER): Payer: Self-pay | Admitting: Gynecologic Oncology

## 2022-05-08 ENCOUNTER — Inpatient Hospital Stay: Payer: Medicare Other | Attending: Gynecologic Oncology | Admitting: Gynecologic Oncology

## 2022-05-08 VITALS — BP 171/73 | HR 65 | Temp 98.2°F | Resp 18 | Ht 65.0 in

## 2022-05-08 DIAGNOSIS — Z7189 Other specified counseling: Secondary | ICD-10-CM

## 2022-05-08 DIAGNOSIS — Z9079 Acquired absence of other genital organ(s): Secondary | ICD-10-CM

## 2022-05-08 DIAGNOSIS — C52 Malignant neoplasm of vagina: Secondary | ICD-10-CM

## 2022-05-08 LAB — SURGICAL PATHOLOGY

## 2022-05-08 NOTE — Progress Notes (Signed)
Patient presented for exam after expressing on the phone when I called her with pathology reports that she could not find the opening into which to place Silvadene and estrogen cream.  As I suspected, the Surgicel was still in place within the vaginal opening.  This was removed.  With a mirror, we showed the patient where the vaginal opening was, the urethra, and where to place the creams.  Jeral Pinch MD Gynecologic Oncology

## 2022-05-08 NOTE — Telephone Encounter (Signed)
Spoke with Ms. Lipnick this morning. She states she is eating, drinking and urinating well. She has not had a BM yet but is passing gas. She is taking senokot as prescribed and encouraged her to drink plenty of water. She denies fever or chills. Incisions are dry and intact. She rates her pain 0/10. Her pain is controlled with Tramadol.    Instructed to call office with any fever, chills, purulent drainage, uncontrolled pain or any other questions or concerns. Patient verbalizes understanding.   Pt aware of post op appointments as well as the office number [phone] and after hours number [phone] to call if she has any questions or concerns. Reviewed post-op instructions with patient as well.

## 2022-05-08 NOTE — Anesthesia Postprocedure Evaluation (Signed)
Anesthesia Post Note  [name]  Procedure(s) Performed: EXAM UNDER ANESTHESIA (Perineum) POSSIBLE VAGINAL  BIOPSY (Vagina ) POSSIBLE WIDE EXCISION VULVECTOMY/VAGINA (Perineum) POSSIBLE CO2 LASER APPLICATION TO VAGINA/VULVA (Perineum)     Patient location during evaluation: PACU Anesthesia Type: General Level of consciousness: awake and alert Pain management: pain level controlled Vital Signs Assessment: post-procedure vital signs reviewed and stable Respiratory status: spontaneous breathing, nonlabored ventilation and respiratory function stable Cardiovascular status: stable and blood pressure returned to baseline Anesthetic complications: no   No notable events documented.  Last Vitals:  Vitals:   05/07/22 1415 05/07/22 1510  BP: (!) 169/66 (!) 162/82  Pulse: (!) 51 (!) 57  Resp: 16 20  Temp:    SpO2: 99% 97%    Last Pain:  Vitals:   05/07/22 1400  TempSrc:   PainSc: 0-No pain                 Audry Pili

## 2022-05-08 NOTE — Telephone Encounter (Signed)
Called the patient, discussed procedures performed yesterday during surgery as well as results.  I am going to reach out to Dr. Randa Ngo again about possibility of giving her any additional radiation.  Will clarify with pathology the margin status for the apical/posterior resection.  Jeral Pinch MD Gynecologic Oncology

## 2022-05-15 ENCOUNTER — Encounter: Payer: Self-pay | Admitting: Oncology

## 2022-05-15 NOTE — Progress Notes (Signed)
Requested PD-L1 on accession [phone]  with [facility] via email.

## 2022-05-19 ENCOUNTER — Other Ambulatory Visit: Payer: Self-pay | Admitting: Oncology

## 2022-05-19 NOTE — Progress Notes (Signed)
Gynecologic Oncology Multi-Disciplinary Disposition Conference Note  Date of the Conference: 05/19/2022  Patient Name: [name] Richards  Primary GYN Oncologist: Dr. 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 Disposition is to Additional radiation.  If PD-L1 results are positive, consideration for immunotherapy.   This Multidisciplinary conference took place involving physicians from Gynecologic Oncology, Medical Oncology, Radiation Oncology, Pathology, Radiology along with the Gynecologic Oncology Nurse Practitioner and Gynecologic Oncology Nurse Navigator.  Comprehensive assessment of the patient's malignancy, staging, need for surgery, chemotherapy, radiation therapy, and need for further testing were reviewed. Supportive measures, both inpatient and following discharge were also discussed. The recommended plan of care is documented. Greater than 35 minutes were spent correlating and coordinating this patient's care.

## 2022-05-20 DIAGNOSIS — Z1231 Encounter for screening mammogram for malignant neoplasm of breast: Secondary | ICD-10-CM | POA: Diagnosis not present

## 2022-05-30 ENCOUNTER — Inpatient Hospital Stay: Payer: Medicare Other | Attending: Gynecologic Oncology | Admitting: Gynecologic Oncology

## 2022-05-30 ENCOUNTER — Encounter: Payer: Self-pay | Admitting: Gynecologic Oncology

## 2022-05-30 VITALS — BP 167/68 | HR 63 | Temp 98.6°F | Wt 158.2 lb

## 2022-05-30 DIAGNOSIS — Z90722 Acquired absence of ovaries, bilateral: Secondary | ICD-10-CM | POA: Insufficient documentation

## 2022-05-30 DIAGNOSIS — Z5111 Encounter for antineoplastic chemotherapy: Secondary | ICD-10-CM | POA: Insufficient documentation

## 2022-05-30 DIAGNOSIS — L900 Lichen sclerosus et atrophicus: Secondary | ICD-10-CM

## 2022-05-30 DIAGNOSIS — Z9079 Acquired absence of other genital organ(s): Secondary | ICD-10-CM

## 2022-05-30 DIAGNOSIS — Z85118 Personal history of other malignant neoplasm of bronchus and lung: Secondary | ICD-10-CM | POA: Insufficient documentation

## 2022-05-30 DIAGNOSIS — Z8542 Personal history of malignant neoplasm of other parts of uterus: Secondary | ICD-10-CM | POA: Insufficient documentation

## 2022-05-30 DIAGNOSIS — Z90710 Acquired absence of both cervix and uterus: Secondary | ICD-10-CM | POA: Insufficient documentation

## 2022-05-30 DIAGNOSIS — Z923 Personal history of irradiation: Secondary | ICD-10-CM | POA: Insufficient documentation

## 2022-05-30 DIAGNOSIS — C52 Malignant neoplasm of vagina: Secondary | ICD-10-CM | POA: Insufficient documentation

## 2022-05-30 DIAGNOSIS — Z79899 Other long term (current) drug therapy: Secondary | ICD-10-CM | POA: Insufficient documentation

## 2022-05-30 DIAGNOSIS — Z7189 Other specified counseling: Secondary | ICD-10-CM

## 2022-05-30 NOTE — Patient Instructions (Signed)
It was good to see you.  You are healing well from surgery.  Please review the information on pembrolizumab or Keytruda.  This is the immunotherapy that we talked about that I suspect she will have a good response to based on the testing done on your tumor.  If/when you are ready to move forward with next steps in terms of treatment, please call Clydie Braun, our nurse navigator, who can get you set up with Dr. Bertis Ruddy (medical oncologist).

## 2022-05-30 NOTE — Progress Notes (Signed)
PDL1 CPS 90% RT, pembro, surveillance Clydie Braun Pt information  Gynecologic Oncology Return Clinic Visit  05/30/22  Reason for Visit: follow-up, treatment planning  Treatment History: Oncology History Overview Note  Patient followed due to HX pulmonary nodules noted 2011  Primary cancer of left upper lobe of lung (HCC)   Staging form: Lung, AJCC 7th Edition   - Clinical stage from 09/27/2015: Stage IA (T1b, N0, M0) - Signed by Si Gaul, MD on 09/27/2015    Primary cancer of left upper lobe of lung  07/12/2015 Imaging   CT Chest IMPRESSION:  The solid component of the large part solid nodule in the left upper lobe measures 9 x 3 mm (mean diameter 6 mm.) persistent part solid nodules with solid components greater than or equal to 6 mm should be considered highly suspicious for pulmonary adenocarcinoma   08/01/2015 Initial Diagnosis   Primary cancer of left upper lobe of lung (HCC)   08/01/2015 Surgery   PROCEDURE:  Left video-assisted thoracoscopy,           Wedge resection,           Thoracoscopic lingular sparing left upper lobectomy           Lymph node dissection           On-Q local anesthetic catheter placement   08/01/2015 Pathology Results   Lung, resection (segmental or lobe), Left Upper Lobe - ADENOCARCINOMA, WELL DIFFERENTIATED, SPANNING 2.5 CM. - THE SURGICAL RESECTION MARGINS ARE NEGATIVE FOR CARCINOMA. 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 She underwent a total abdominal hysterectomy and bilateral salpingo-oophorectomy, pelvic and para-aortic lymphadenectomy. She received postoperative whole pelvis radiation therapy.  Her other primary gynecologic problem has been lichen sclerosus managed with when necessary clobetasol.   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   In December 2016 the patient was found to have a new primary squamous cell carcinoma the vulva undergoing a modified radical vulvectomy on 02/06/2015. She underwent a modified radical vulvectomy at [facility] on 02/06/2015. She was found to have a 3 cm vulvar lesion of the posterior vulva extending into the posterior vagina. A rhomboid flap was required to close the incision.  Final pathology showed some close surgical margins. However, the specimen margins were toward the time of surgery and it was Dr Nelwyn Salisbury impression that they were widely around the primary cancer. She discussed options with the patient and they agreed to monitor her closely but not recommend any radiation therapy to the vulva.   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  Depth of invasion was 4 mm all margins were negative.  Follow-up PET scan March 23, 2017 showed no evidence of metastatic disease.   Examination of the vulva during routine surveillance on 07/20/19 showed a friable, verrucous lesion measuring approximately 2 cm was seen on the anterior wall of the suburethral vagina extending to the introitus and wrapping around to the right mid vagina. This was concerning for recurrence and was biopsied at that appointment. 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    On 08/11/19 she underwent total vaginectomy. Intraoperative findings were significant for a forshortened 3cm vagina, friable 4cm plaque  on the anterior vagina extending to the right fornix and left posterior vaginal wall. This necessitated a total vaginectomy to resect the lesion grossly. Her tissues were extremely friable due to age, prior radiation, menopause and lichen sclerosis. There was unavoidable fragmentation of the tissues during the resection. All gross visible disease was removed.  Surgery was uncomplicated, and she reported no postop pain.  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 The suspicious foci are present at the edges of tissue fragments.  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    Post-op PET scan in August, 2021 showed no metastatic disease.   The patient received adjuvant vaginal brachytherapy between 09/29/2019 through 10/12/2019.  She tolerated treatment very well with no toxicities.   I saw the patient on 12/28.  At the time of her visit, I was concerned for dysplastic changes versus radiation treatment within the vagina and on her vulva.   On 02/14/2021, the patient underwent exam under anesthesia, vaginal and vulvar biopsies.  Findings were notable for anatomic changes related to prior surgical treatment and radiation.  Significant atrophy was noted.  Multiple spots of hyperkeratosis versus acetowhite after application of acetic acid were found, all biopsied.  Final pathology revealed invasive squamous cell carcinoma at 5:00 of the vaginal biopsy.  Multiple other biopsies including a 1:00 periclitoral biopsy and apex of the vagina showed low-grade squamous intraepithelial lesion.  Biopsy from 7:00 within the vagina showed high-grade squamous intraepithelial lesion. Submitted biopsies are noted to be very small, superficial, and oriented.  Most of them have dysplasia or carcinoma extended to the lateral edges but without involvement of the base of the biopsy.  The one biopsy showing carcinoma is 1 mm thick and described as extending to the edge of the biopsy where the lesion is transected and therefore measurement likely does not accurately represent the final thickness.   PET scan was performed on 02/28/2021.  There was noted to  be interval decrease in degree of FDG uptake within the vaginal introitus compared to PET scan in 2021 after her last surgery.  No concerning findings for metastatic disease.  Comment was noted that there is mild uptake within a left inguinal lymph node but the morphology is benign.  Imaging was reviewed at our tumor conference on 1/23 with discussion being findings did not represent metastatic disease.   03/20/21: Vaginectomy, CO2 laser of the vulva for recurrent vaginal cancer, high-grade vulvar dysplasia.  Vaginal excision showed squamous mucosa with ulceration and inflammation.  No malignancy or dysplasia identified.  Rare atypical "radiation" fibroblast embedded.   01/2022: Developed new vulvar lesion concerning for at least high-grade dysplasia, malignancy suspected.   02/19/2022: Partial simple right posterior vulvectomy with rotational rhomboid flap.  Final pathology revealed recurrent grade 2 invasive squamous cell carcinoma, depth of invasion 4 mm, margins negative.   03/13/2022: PET/CT shows focal hypermetabolism in the region of the vaginal introitus.  Left groin node seen on prior study is stable to minimally increased in size with low-level hypermetabolism.  No new sites of hypermetabolic disease.  On review with radiologist, given no treatment in the year interval since prior study, left groin node is less suspicious for active disease, consistent with reactive etiology.  Changes in the vaginal introitus region thought to represent either postoperative change or urinary contamination.  The patient  presented postop with findings of new vaginal lesion.  On 05/07/2022, she underwent partial vaginectomy, vulvar biopsies.  Distal right vaginal lesion showed invasive squamous cell carcinoma, cauterized carcinoma present at the mucosal margin, deep margin negative.  Left sidewall excision also shows invasive squamous cell carcinoma, deep margin negative for carcinoma but cauterized carcinoma present at  the mucosal margin.  Vaginal apex and posterior excisions show at least squamous cell carcinoma in situ.  Right vulvar biopsy revealed squamous mucosa with acute inflammation and reactive epithelial changes, no dysplasia or malignancy.  Left vulvar biopsy and posterior biopsy at 5:00 revealed the same.  Interval History: She reports doing well.  She has had minimal discharge and bleeding since surgery.  Reports baseline bowel and bladder function.  Continues to have some irritation of her vulva, working very hard to keep this area clean and dry and not use any additional products.  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 cath due to past injury   History of benign kidney tumor excluding renal pelvis 07/26/2009   s/p  right partial nephrectomy for angiomyolipoma   History of cervical cancer 03/1995   s/p   TAH /  BSO for poorly differentiated cervical carcinoma.   History of endometrial cancer 03/1995   s/p  TAH/ BSO for cervical cancer w/ incidental finding endometrial cancer also ,  completed radiation 11/ 1997   History of external beam radiation therapy 1997   completed abominal radation for endometrial cancer 11/ 1997   History of radiation therapy 10/12/2019   [facility] HDR brachytherapy  09/29/2019-10/12/2019  Dr Antony Blackbird  (recurrent VAIN)   History of resection of meningioma 11/1968   T12   History of small bowel obstruction 12/2008   partial SBO admission 12/2008;  recurrent SBO in 02/2011;  02/ 2017;   so far no surgical intervention (per pt has a narrowed distal colon)   Hyperlipidemia    Hypertension    Hypothyroidism    followed by pcp   Lichen sclerosus of vulva    per pt w/ chronic itching   Nocturia     Pre-diabetes    Primary cancer of left upper lobe of lung 08/01/2015   previously followed by oncologist--- dr Phill Myron--- released 09-13-2018  , pt non-smokerStage 1  Non-small cell ,  no chemo or radiation,  survillance   (bilateral nodules for noted in 2011)   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   GYN oncologist--- dr Pricilla Holm---- total vaginectomy and completed radiation 09/ 2021;    laser ablation 02/ 2023   Recurrent vulvar cancer 2016   GYN oncologist---- dr Pricilla Holm;   new dx 12/ 2016  s/p radica vulvectomy @ [facility] by dr Stanford Breed recurrent 10/ 2018 s/p modified vulvecotmy;  06/ 2021 invasive to vagina/ recurrent vulva s/p total vagainectomy & radiation; laser ablation 02/ 2023;   new lesion 12/ 2023 s/p simplec vulectomy w/ flap 01/ 2024    Past Surgical History:  Procedure Laterality Date   CATARACT EXTRACTION W/ INTRAOCULAR LENS IMPLANT Bilateral 2006   CO2 LASER APPLICATION N/A 03/20/2021   Procedure: CO2 LASER APPLICATION to vagina and vuvla;  Surgeon: Carver Fila, MD;  Location:  [facility];  Service: Gynecology;  Laterality: N/A;   CO2 LASER APPLICATION N/A 05/07/2022   Procedure: POSSIBLE CO2 LASER APPLICATION TO VAGINA/VULVA;  Surgeon: Carver Fila, MD;  Location: [facility];  Service: Gynecology;  Laterality: N/A;   LYMPH NODE DISSECTION Left 08/01/2015   Procedure: LYMPH NODE DISSECTION;  Surgeon: Loreli Slot, MD;  Location: [facility] OR;  Service: Thoracic;  Laterality: Left;   RADICAL VULVECTOMY  02/06/2015    by dr Stanford Breed   ROBOTIC ASSITED PARTIAL NEPHRECTOMY  07/26/2009    by dr Laverle Patter   SEGMENTECOMY Left 08/01/2015   Procedure: Left Upper Lobe SEGMENTECTOMY;  Surgeon: Loreli Slot, MD;  Location: [facility] OR;  Service: Thoracic;  Laterality: Left;   TONSILLECTOMY AND ADENOIDECTOMY     child   TOTAL ABDOMINAL HYSTERECTOMY W/ BILATERAL SALPINGOOPHORECTOMY  03/31/1995    by dr Stanford Breed;   TAH/BSO  pelvic and periaortic lymphadenectomy   TUBAL LIGATION Bilateral    yrs ago   TUMOR REMOVAL  11/1968   meningioma  T 12   VAGINECTOMY, PARTIAL N/A 08/11/2019   Procedure: TOTAL VAGINECTOMY;  Surgeon: Adolphus Birchwood, MD;  Location: [facility];  Service: Gynecology;  Laterality: N/A;   VAGINECTOMY, PARTIAL N/A 03/20/2021   Procedure: vaginal excision;  Surgeon: Carver Fila, MD;  Location: [facility];  Service: Gynecology;  Laterality: N/A;   VIDEO ASSISTED THORACOSCOPY (VATS)/WEDGE RESECTION Left 08/01/2015   Procedure: VIDEO ASSISTED THORACOSCOPY (VATS)/WEDGE RESECTION;  Surgeon: Loreli Slot, MD;  Location: [facility] OR;  Service: Thoracic;  Laterality: Left;   VULVA Ples Specter BIOPSY N/A 02/14/2021   Procedure: VAGINAL AND VULVAR BIOPSY;  Surgeon: Carver Fila, MD;  Location: [facility];  Service: Gynecology;  Laterality: N/A;   VULVA /PERINEUM BIOPSY N/A 05/07/2022   Procedure: POSSIBLE VAGINAL  BIOPSY;  Surgeon: Carver Fila, MD;  Location: [facility];  Service: Gynecology;  Laterality: N/A;   VULVAR LESION REMOVAL N/A 02/19/2022   Procedure: VULVAR LESION EXCISION, ROTATIONAL FLAP CLOSURE;  Surgeon: Carver Fila, MD;  Location: [facility];  Service: Gynecology;  Laterality: N/A;   VULVECTOMY N/A 05/07/2022   Procedure: POSSIBLE WIDE EXCISION VULVECTOMY/VAGINA;  Surgeon: Carver Fila, MD;  Location: [facility];  Service: Gynecology;  Laterality: N/A;   VULVECTOMY PARTIAL  01/13/2017    by dr Stanford Breed    Family History  Problem Relation Age of Onset   Hypertension Other    Diabetes Other    Stroke Other    CAD Other    Colon cancer Neg Hx    Breast cancer Neg Hx    Ovarian cancer Neg Hx    Endometrial cancer Neg Hx    Pancreatic cancer Neg Hx    Prostate cancer Neg Hx     Social History   Socioeconomic History   Marital status: Divorced    Spouse name: Not on file   Number of  children: Not on file   Years of education: Not on file   Highest education level: Not on file  Occupational History   Not on file  Tobacco Use   Smoking status: Never   Smokeless tobacco: Never  Vaping Use   Vaping Use: Never used  Substance and Sexual Activity   Alcohol use: Not Currently    Comment: Occ   Drug use: Never   Sexual activity: Yes    Birth control/protection: Surgical  Other Topics Concern   Not on file  Social History Narrative   Lives at home alone, works out at [employer] 6 days a week, still very active. Has an ex-husband who she is in contact with. Son died of an MI and another son in [place].    Social Determinants of Health   Financial Resource Strain: Not on file  Food Insecurity: Not on file  Transportation Needs: Not on file  Physical Activity: Not on file  Stress: Not on file  Social Connections: Not on file    Current Medications:  Current Outpatient Medications:    acetaminophen (TYLENOL) 500 MG tablet, Take 500-1,000 mg by mouth every 6 (six) hours as needed (pain.)., Disp: , Rfl:    acyclovir (ZOVIRAX) 800 MG tablet, Take 800 mg by mouth 2 (two) times daily as needed (cold sores)., Disp: , Rfl:    albuterol (VENTOLIN HFA) 108 (90 Base) MCG/ACT inhaler, Inhale 2 puffs into the lungs every 6 (six) hours as needed for wheezing or shortness of breath., Disp: , Rfl:    amLODipine (NORVASC) 5 MG tablet, Take 5 mg by mouth every evening., Disp: , Rfl:    Ascorbic Acid (VITAMIN C PO), Take 500 mg by mouth in the morning., Disp: , Rfl:    benazepril-hydrochlorthiazide (LOTENSIN HCT) 20-25 MG tablet, Take 1 tablet by mouth in the morning., Disp: , Rfl:    benzonatate (TESSALON) 200 MG capsule, Take 200 mg by mouth 2 (two) times daily as needed for cough., Disp: , Rfl:    BIOTIN PO, Take 1 tablet by mouth in the morning., Disp: , Rfl:    Cholecalciferol (VITAMIN D3 PO), Take 2,000 Units by mouth in the morning and at bedtime., Disp: , Rfl:    CRANBERRY  PO, Take 1 tablet by mouth in the morning., Disp: , Rfl:    docusate sodium (COLACE) 100 MG capsule, Take 100 mg by mouth 2 (two) times daily., Disp: , Rfl:    doxylamine, Sleep, (UNISOM) 25 MG tablet, Take 12.5 mg by mouth at bedtime., Disp: , Rfl:    estradiol (ESTRACE VAGINAL) 0.1 MG/GM vaginal cream, Plan to beginning to use vaginal estrogen cream nightly at bedtime for 2 weeks then three times a week after to assist with healing in the vagina. You will need to use a fingertip size amount of cream on your finger and place slightly past the vaginal entrance at bedtime. Do not use the applicator if this comes with the cream., Disp: 42.5 g, Rfl: 3   fexofenadine (ALLEGRA) 180 MG tablet, Take 180 mg by mouth daily., Disp: , Rfl:    ibuprofen (ADVIL) 200 MG tablet, Take 200-600 mg by mouth every 8 (eight) hours as needed (pain.)., Disp: , Rfl:    levothyroxine (SYNTHROID, LEVOTHROID) 75 MCG tablet, Take 75 mcg by mouth daily before breakfast., Disp: , Rfl:    MAGNESIUM PO, Take 1 tablet by mouth every evening., Disp: , Rfl:    Multiple Vitamin (MULTIVITAMIN WITH MINERALS) TABS tablet, Take 1 tablet by mouth in the morning., Disp: , Rfl:    Omega-3 Fatty Acids (FISH OIL PO), Take 1 capsule by mouth in the morning., Disp: , Rfl:    Polyethyl Glycol-Propyl Glycol (SYSTANE ULTRA) 0.4-0.3 % SOLN, Place 1-2 drops into both eyes 3 (three) times daily as needed (dry/irritated eyes.)., Disp: , Rfl:    polyethylene glycol (MIRALAX / GLYCOLAX) packet, Take 11.3333 g by mouth at bedtime. 2/3 capful, Disp: , Rfl:    senna-docusate (SENOKOT-S) 8.6-50 MG tablet, Take 2 tablets by  mouth at bedtime. For AFTER surgery, do not take if having diarrhea, Disp: 60 tablet, Rfl: 2   simvastatin (ZOCOR) 20 MG tablet, Take 20 mg by mouth at bedtime. , Disp: , Rfl:    traMADol (ULTRAM) 50 MG tablet, Take 1 tablet (50 mg total) by mouth every 6 (six) hours as needed for severe pain. For acute post-op pain, do not take and drive,  Disp: 10 tablet, Rfl: 0   TURMERIC PO, Take 1 capsule by mouth every evening., Disp: , Rfl:   Review of Systems: Denies appetite changes, fevers, chills, fatigue, unexplained weight changes. Denies hearing loss, neck lumps or masses, mouth sores, ringing in ears or voice changes. Denies cough or wheezing.  Denies shortness of breath. Denies chest pain or palpitations. Denies leg swelling. Denies abdominal distention, pain, blood in stools, constipation, diarrhea, nausea, vomiting, or early satiety. Denies pain with intercourse, dysuria, frequency, hematuria or incontinence. Denies hot flashes, pelvic pain, vaginal bleeding or vaginal discharge.   Denies joint pain, back pain or muscle pain/cramps. Denies itching, rash, or wounds. Denies dizziness, headaches, numbness or seizures. Denies swollen lymph nodes or glands, denies easy bruising or bleeding. Denies anxiety, depression, confusion, or decreased concentration.  Physical Exam: BP (!) 167/68 (BP Location: Left Arm, Patient Position: Sitting)   Pulse 63   Temp 98.6 F (37 C) (Oral)   Wt 158 lb 3.2 oz (71.8 kg)   SpO2 99%   BMI 26.33 kg/m  General: Alert, oriented, no acute distress. HEENT: Normocephalic, atraumatic, sclera anicteric. Chest: Unlabored breathing on room air.  GU: External genitalia notable for significant erythema and thinning of tissue of bilateral labia anteriorly.  Well-healed posterior perineal incision.  Within the vagina, minimal exudate, no visible lesions noted, no nodularity on palpation.  Laboratory & Radiologic Studies: A. VAGINAL LESION, DISTAL RIGHT, EXCISION:      Invasive squamous cell carcinoma.      Cauterized carcinoma present at the mucosal margin. Deep margin are negative for carcinoma.  B. VAGINAL SIDEWALL, LEFT, EXCISION:      Invasive squamous cell carcinoma.      Cauterized carcinoma present at the mucosal margin. Deep margin are negative for carcinoma.  C. VAGINA, APEX, EXCISION:       At least squamous cell carcinoma in situ.  D. VAGINA, POSTERIOR, BIOPSY:      At least squamous cell carcinoma in situ.  E. VULVA, RIGHT, BIOPSY:      Squamous mucosa with acute inflammation and reactive epithelial changes.      Negative for dysplasia or malignancy.  F. VULVA, LEFT, BIOPSY:      Squamous mucosa with acute and chronic inflammation and reactive epithelial changes.      Negative for dysplasia or malignancy.  G. VULVA, POSTERIOR, 5:00, BIOPSY:      Squamous mucosa with acute and chronic inflammation and reactive epithelial changes.      Negative for dysplasia or malignancy.   PDL1 CPS: 90%  Assessment & Plan: [name] Richards is a 77 y.o. 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  Patient is overall doing well for postoperative standpoint.  Discussed continuing to avoid any substances that would cause irritation of her vulva.  She has previously used clobetasol  without much relief.  Reviewed with her pathology report and findings again at the time of surgery.  I resected posteriorly along the vagina is much as I could without fear of injuring the rectum.  Given pathology report, I am concerned that there is at least microscopic residual squamous cell carcinoma or squamous cell carcinoma in situ.  The patient has had multiple recurrences of vulvar and vaginal squamous cell carcinoma since I have met her, which seem to be occurring more frequently.  She would be a candidate for additional radiation (after review by Dr. Roselind Messier).  I had pathology add PD-L1 testing and her CPS score is 90%.  Today I brought up the possibility of immunotherapy using pembrolizumab  given her PD-L1 results.  We discussed this medication, its administration, and side effects.  I have asked her to think about whether she would be opening to consideration of immunotherapy as treatment, which I am hopeful would allow her to maintain an excellent quality of life.  She was given some patient information to take home to read about this and discuss with family.  I have asked her to call Clydie Braun, our nurse navigator, if/when she is ready to take the neck step forward which would mean meeting with Dr. Bertis Ruddy.  While it is certainly an option to proceed with additional radiation, I voiced my concerns about this tensional he having side effects and limiting further surgical intervention.  I fear that from a vaginal standpoint, she would not be a good candidate for additional surgery without significant risk of complications.  We also discussed the possibility of her decision is not to proceed with any additional treatment.    28 minutes of total time was spent for this patient encounter, including preparation, face-to-face counseling with the patient and coordination of care, and documentation of the encounter.  Eugene Garnet, MD  Division of Gynecologic Oncology  Department of Obstetrics and Gynecology  [facility]

## 2022-06-02 ENCOUNTER — Telehealth: Payer: Self-pay | Admitting: Oncology

## 2022-06-02 NOTE — Telephone Encounter (Signed)
Lus called and said she has decided on immunotherapy and would like to meet with Dr. Bertis Ruddy.  Scheduled new patient appointment for 06/03/22 at 2:00 with 1:30 arrival.

## 2022-06-03 ENCOUNTER — Inpatient Hospital Stay: Payer: Medicare Other

## 2022-06-03 ENCOUNTER — Inpatient Hospital Stay: Payer: Medicare Other | Admitting: Hematology and Oncology

## 2022-06-03 ENCOUNTER — Other Ambulatory Visit: Payer: Self-pay

## 2022-06-03 ENCOUNTER — Encounter: Payer: Self-pay | Admitting: Hematology and Oncology

## 2022-06-03 VITALS — BP 164/58 | HR 62 | Temp 98.0°F | Resp 18 | Ht 65.0 in | Wt 159.0 lb

## 2022-06-03 DIAGNOSIS — C52 Malignant neoplasm of vagina: Secondary | ICD-10-CM

## 2022-06-03 DIAGNOSIS — Z5111 Encounter for antineoplastic chemotherapy: Secondary | ICD-10-CM | POA: Diagnosis not present

## 2022-06-03 DIAGNOSIS — Z90710 Acquired absence of both cervix and uterus: Secondary | ICD-10-CM

## 2022-06-03 DIAGNOSIS — Z923 Personal history of irradiation: Secondary | ICD-10-CM | POA: Diagnosis not present

## 2022-06-03 DIAGNOSIS — Z8542 Personal history of malignant neoplasm of other parts of uterus: Secondary | ICD-10-CM

## 2022-06-03 DIAGNOSIS — C3412 Malignant neoplasm of upper lobe, left bronchus or lung: Secondary | ICD-10-CM

## 2022-06-03 DIAGNOSIS — Z85118 Personal history of other malignant neoplasm of bronchus and lung: Secondary | ICD-10-CM

## 2022-06-03 DIAGNOSIS — Z90722 Acquired absence of ovaries, bilateral: Secondary | ICD-10-CM

## 2022-06-03 DIAGNOSIS — Z79899 Other long term (current) drug therapy: Secondary | ICD-10-CM | POA: Diagnosis not present

## 2022-06-03 LAB — CBC WITH DIFFERENTIAL (CANCER CENTER ONLY)
Abs Immature Granulocytes: 0.02 10*3/uL (ref 0.00–0.07)
Basophils Absolute: 0 10*3/uL (ref 0.0–0.1)
Basophils Relative: 1 %
Eosinophils Absolute: 0.1 10*3/uL (ref 0.0–0.5)
Eosinophils Relative: 1 %
HCT: 40.1 % (ref 36.0–46.0)
Hemoglobin: 12.7 g/dL (ref 12.0–15.0)
Immature Granulocytes: 0 %
Lymphocytes Relative: 21 %
Lymphs Abs: 1.1 10*3/uL (ref 0.7–4.0)
MCH: 21.3 pg — ABNORMAL LOW (ref 26.0–34.0)
MCHC: 31.7 g/dL (ref 30.0–36.0)
MCV: 67.2 fL — ABNORMAL LOW (ref 80.0–100.0)
Monocytes Absolute: 0.5 10*3/uL (ref 0.1–1.0)
Monocytes Relative: 9 %
Neutro Abs: 3.8 10*3/uL (ref 1.7–7.7)
Neutrophils Relative %: 68 %
Platelet Count: 252 10*3/uL (ref 150–400)
RBC: 5.97 MIL/uL — ABNORMAL HIGH (ref 3.87–5.11)
RDW: 14.9 % (ref 11.5–15.5)
WBC Count: 5.5 10*3/uL (ref 4.0–10.5)
nRBC: 0 % (ref 0.0–0.2)

## 2022-06-03 LAB — CMP (CANCER CENTER ONLY)
ALT: 19 U/L (ref 0–44)
AST: 19 U/L (ref 15–41)
Albumin: 4.8 g/dL (ref 3.5–5.0)
Alkaline Phosphatase: 101 U/L (ref 38–126)
Anion gap: 11 (ref 5–15)
BUN: 15 mg/dL (ref 8–23)
CO2: 27 mmol/L (ref 22–32)
Calcium: 10 mg/dL (ref 8.9–10.3)
Chloride: 94 mmol/L — ABNORMAL LOW (ref 98–111)
Creatinine: 0.63 mg/dL (ref 0.44–1.00)
GFR, Estimated: >60 mL/min (ref >60)
Glucose, Bld: 97 mg/dL (ref 70–99)
Potassium: 3.5 mmol/L (ref 3.5–5.1)
Sodium: 132 mmol/L — ABNORMAL LOW (ref 135–145)
Total Bilirubin: 0.4 mg/dL (ref 0.3–1.2)
Total Protein: 7.8 g/dL (ref 6.5–8.1)

## 2022-06-03 LAB — TSH: TSH: 1.161 u[IU]/mL (ref 0.350–4.500)

## 2022-06-03 LAB — T4, FREE: Free T4: 1.12 ng/dL (ref 0.61–1.12)

## 2022-06-03 MED ORDER — ONDANSETRON HCL 8 MG PO TABS: 8.0000 mg | ORAL_TABLET | Freq: Three times a day (TID) | ORAL | 1 refills | Status: DC | PRN

## 2022-06-03 NOTE — Assessment & Plan Note (Signed)
She had remote history of lung cancer but no evidence of recurrence Observe only

## 2022-06-03 NOTE — Progress Notes (Signed)
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 Her most recent surgical sample was sent for molecular study; PD-L1 test came back 90% positive We discussed the role of targeted immunotherapy with pembrolizumab The risk, benefits, side effects were reviewed and discussed and she is in agreement to proceed We will schedule chemo education class and blood work today and to start her on treatment next week I recommend minimum 3 cycles of treatment before we repeat imaging study   Primary cancer of left upper lobe of lung (HCC) She had remote history of lung cancer but no evidence of recurrence Observe only  Orders Placed This Encounter  Procedures   CMP (Cancer Center only)    Standing Status:   Future    Standing Expiration Date:   06/03/2023   CBC with Differential (Cancer Center Only)    Standing Status:   Future    Standing Expiration Date:   06/03/2023   TSH    Standing Status:   Future    Standing Expiration Date:   06/03/2023   T4, free    Standing Status:   Future    Standing Expiration Date:   06/03/2023   CBC with Differential (Cancer Center Only)    Standing Status:   Future    Standing Expiration Date:   06/09/2023   CMP (Cancer Center only)    Standing Status:   Future    Standing Expiration Date:   06/09/2023   T4    Standing Status:   Future    Standing Expiration Date:   06/09/2023   TSH    Standing Status:   Future    Standing Expiration Date:   06/09/2023   CBC with Differential (Cancer Center Only)    Standing Status:   Future    Standing Expiration Date:   06/30/2023   CMP (Cancer Center only)    Standing Status:   Future    Standing Expiration  Date:   06/30/2023   CBC with Differential (Cancer Center Only)    Standing Status:   Future    Standing Expiration Date:   07/18/2023   CMP (Cancer Center only)    Standing Status:   Future    Standing Expiration Date:   07/18/2023   T4    Standing Status:   Future    Standing Expiration Date:   07/18/2023   TSH    Standing Status:   Future    Standing Expiration Date:   07/18/2023   CBC with Differential (Cancer Center Only)    Standing Status:   Future    Standing Expiration Date:   08/08/2023   CMP (Cancer Center only)    Standing Status:   Future    Standing Expiration Date:   08/08/2023   CBC with Differential (Cancer Center Only)    Standing Status:   Future    Standing Expiration Date:   08/29/2023   CMP (Cancer Center only)    Standing Status:   Future    Standing Expiration Date:   08/29/2023   CBC with Differential (Cancer Center Only)    Standing Status:   Future    Standing Expiration Date:   09/19/2023   CMP (Cancer Center only)  Standing Status:   Future    Standing Expiration Date:   09/19/2023   T4    Standing Status:   Future    Standing Expiration Date:   09/19/2023   TSH    Standing Status:   Future    Standing Expiration Date:   09/19/2023   CBC with Differential (Cancer Center Only)    Standing Status:   Future    Standing Expiration Date:   10/10/2023   CMP (Cancer Center only)    Standing Status:   Future    Standing Expiration Date:   10/10/2023   CBC with Differential (Cancer Center Only)    Standing Status:   Future    Standing Expiration Date:   10/31/2023   CMP (Cancer Center only)    Standing Status:   Future    Standing Expiration Date:   10/31/2023   CBC with Differential (Cancer Center Only)    Standing Status:   Future    Standing Expiration Date:   11/21/2023   CMP (Cancer Center only)    Standing Status:   Future    Standing Expiration Date:   11/21/2023   T4    Standing Status:   Future    Standing Expiration Date:   11/21/2023   TSH     Standing Status:   Future    Standing Expiration Date:   11/21/2023    The total time spent in the appointment was 60 minutes encounter with patients including review of chart and various tests results, discussions about plan of care and coordination of care plan   All questions were answered. The patient knows to call the clinic with any problems, questions or concerns. No barriers to learning was detected.  [name], MD 4/23/20242:36 PM  CHIEF COMPLAINTS/PURPOSE OF CONSULTATION:  Recurrent vaginal cancer, history of vulvar cancer, endometrial cancer and lung cancer  HISTORY OF PRESENTING ILLNESS:  [name] Richards 77 y.o. 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 Summary of oncologic history is as follows: Oncology History Overview Note  Patient followed due to HX pulmonary nodules noted 2011  Primary cancer of left upper lobe of lung (HCC)   Staging form: Lung, AJCC 7th Edition   - Clinical stage from 09/27/2015: Stage IA (T1b, N0, M0) - Signed by Si Gaul, MD on 09/27/2015  PDL-1 90%    Primary cancer of left upper lobe of lung  07/12/2015 Imaging   CT Chest IMPRESSION:  The solid component of the large part solid nodule in the left upper lobe measures 9 x 3 mm (mean diameter 6 mm.) persistent part solid nodules with solid components greater than or equal to 6 mm should be considered highly suspicious for pulmonary adenocarcinoma   08/01/2015 Initial Diagnosis   Primary cancer of left upper lobe of lung (HCC)   08/01/2015 Surgery   PROCEDURE:  Left video-assisted thoracoscopy,           Wedge  resection,           Thoracoscopic lingular sparing left upper lobectomy           Lymph node dissection  On-Q local anesthetic catheter placement   08/01/2015 Pathology Results   Lung, resection (segmental or lobe), Left Upper Lobe - ADENOCARCINOMA, WELL DIFFERENTIATED, SPANNING 2.5 CM. - THE SURGICAL RESECTION MARGINS ARE NEGATIVE FOR CARCINOMA. 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 She underwent a total abdominal hysterectomy and bilateral salpingo-oophorectomy, pelvic and para-aortic lymphadenectomy. She received postoperative whole pelvis radiation therapy.    02/06/2015 Surgery   In December 2016 the patient was found to have a new primary squamous cell carcinoma the vulva undergoing a modified radical vulvectomy on 02/06/2015. She underwent a modified radical vulvectomy at [facility] on 02/06/2015. She was found to have a 3 cm vulvar lesion of the posterior vulva extending into the posterior vagina. A rhomboid flap was required to close the incision.  Final pathology showed some close surgical margins. However, the specimen margins were toward the time of surgery and it was Dr Nelwyn Salisbury impression that they were widely around the primary cancer. She discussed options with the patient and they agreed to monitor her closely but not recommend any radiation therapy to the vulva.   01/13/2017 Surgery   She experienced recurrent vulvar cancer October 2018 managed by a small modified radical vulvectomy  of the right side on January 13, 2017.  Depth of invasion was 4 mm all margins were negative    03/13/2017 PET scan   1. No evidence of recurrent or metastatic disease. 2. Aortic atherosclerosis (ICD10-170.0). Coronary artery calcification.   07/20/2019 Miscellaneous   Examination of the vulva during routine surveillance on 07/20/19 showed a friable, verrucous lesion measuring approximately 2 cm was seen on the anterior wall of the suburethral vagina extending to the introitus and wrapping around to the right mid vagina. This was concerning for recurrence and was biopsied at that appointment. 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    08/11/2019 Miscellaneous   On 08/11/19 she underwent total vaginectomy. Intraoperative findings were significant for a forshortened 3cm vagina, friable 4cm plaque on the anterior vagina extending to the right fornix and left posterior vaginal wall. This necessitated a total vaginectomy to resect the lesion grossly. Her tissues were extremely friable due to age, prior radiation, menopause and lichen sclerosis. There was unavoidable fragmentation of the tissues during the resection. All gross visible disease was removed. Surgery was uncomplicated, and she reported no postop pain.  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 The suspicious foci are present at the edges of tissue fragments.  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        09/13/2019 PET scan   1. No specific evidence of recurrent vulvar carcinoma or distant metastases. There is nonspecific activity in the region of the vaginal introitus which could reflect urine contamination or vaginal recurrence. Correlation with physical examination recommended.  2. No abnormal thoracic activity  status post left upper lobe wedge  resection. 3. Coronary and Aortic Atherosclerosis (ICD10-I70.0).   09/29/2019 - 10/12/2019 Radiation Therapy   The patient received adjuvant vaginal brachytherapy between 09/29/2019 through 10/12/2019.     02/28/2021 PET scan   1. Interval decrease in degree of FDG uptake associated with the vaginal introitus. The SUV max on today's study is equal to 5.96 compared with 9.17 previously. 2. No highly concerning features to suggest nodal metastasis or distant metastatic disease. Mildly increased tracer uptake within and morphologically benign left inguinal lymph node is identified which warrants attention on follow-up imaging. 3. Stable postoperative changes within the left upper lobe without signs to suggest locally recurrent tumor. 4. Aortic Atherosclerosis (ICD10-I70.0). Coronary artery calcifications.   03/11/2021 Initial Diagnosis   Recurrent vaginal cancer   03/20/2021 Procedure   03/20/21: Vaginectomy, CO2 laser of the vulva for recurrent vaginal cancer, high-grade vulvar dysplasia.  Vaginal excision showed squamous mucosa with ulceration and inflammation.  No malignancy or dysplasia identified.  Rare atypical "radiation" fibroblast embedded.    10/24/2021 Imaging   1. No acute abnormality of the abdomen or pelvis. 2. Aortic Atherosclerosis (ICD10-I70.0).   02/19/2022 Surgery   Preop Diagnosis: history of vulvar and vaginal cancer, suspicion for recurrent vulvar cancer   Postoperative Diagnosis: same as above   Surgery: Partial simple right/posterior vulvectomy with rotational rhomboid flap    Surgeons: Carin Hock MD    Pathology: posterior vulva (6-8 o'c) with stitch at 12, additional superior, lateral and inferior margins, deep margin   Operative findings: 2 x 2.5 cm verrucous appearing lesion along the perineum and extending to the introitus, relatively broad 2 cm base. Given size of defect and its location, required rotational flap to close defect.  Hyperkeratotic areas within the significantly foreshortened vagina.   03/13/2022 PET scan    ADDENDUM REPORT: 03/14/2022 08:44   ADDENDUM: I just discussed this study with Dr. Pricilla Holm. The patient had surgery about 3 weeks ago for recurrent disease at the vaginal introitus with no residual disease apparent clinically. Additionally, the patient has had no chemotherapy in the 1 year interval since the prior study and no radiation therapy to the left groin region. In light of this additional history, the changes in the region the vaginal introitus today may well simply represent urinary contaminant rather than residual disease. Further, given lack of interval systemic chemotherapy and no interval focused therapy to the left groin lymph node, the interval stability in this node (9 mm previously versus 10 mm today) and stable low level FDG accumulation ( SUV max = 2.9 today compared to 3.0 previously) would suggest a reactive etiology as more likely, especially in light of the recurrent surgeries to the external genitalia.    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  Approximately 1 x 1 cm raised mass along the right distal vagina from 6-8 o'clock.  Second raised mass along the left vaginal sidewall extending almost up to the vaginal apex measuring approximately 1 x 1.5 cm.  Several small, less than 5 mm, masses along the vaginal apex and superior wall of the posterior vagina.  Well-healed lumbar  incision from recent surgery.  Significant loss of  architecture of the vulva with erythema noted of bilateral anterior labia and along the perineum.  Multiple biopsies taken including 2 and 10:00 along the vulva and at 5:00 along the posterior vulva.     05/07/2022 Pathology Results   CASE: [id number] [name] Surgical Pathology Report  Clinical History: Recurrent vulvar cancer (crm)  FINAL MICROSCOPIC DIAGNOSIS:  A. VAGINAL LESION, DISTAL RIGHT, EXCISION:      Invasive squamous cell carcinoma.      Cauterized carcinoma present at the mucosal margin. Deep margin are negative for carcinoma.  B. VAGINAL SIDEWALL, LEFT, EXCISION:      Invasive squamous cell carcinoma.      Cauterized carcinoma present at the mucosal margin. Deep margin are negative for carcinoma.  C. VAGINA, APEX, EXCISION:      At least squamous cell carcinoma in situ.  D. VAGINA, POSTERIOR, BIOPSY:      At least squamous cell carcinoma in situ.  E. VULVA, RIGHT, BIOPSY:      Squamous mucosa with acute inflammation and reactive epithelial changes.      Negative for dysplasia or malignancy.  F. VULVA, LEFT, BIOPSY:      Squamous mucosa with acute and chronic inflammation and reactive epithelial changes.      Negative for dysplasia or malignancy.  G. VULVA, POSTERIOR, 5:00, BIOPSY:      Squamous mucosa with acute and chronic inflammation and reactive epithelial changes.      Negative for dysplasia or malignancy.     06/03/2022 Cancer Staging   Staging form: Vagina, AJCC 8th Edition - Clinical: cT1, cN0, cM0 - Signed by [name], MD on 06/03/2022   06/09/2022 -  Chemotherapy   Patient is on Treatment Plan : UTERINE Pembrolizumab (200) q21d     She is doing well clinically.  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 cath due to past injury   History of benign kidney tumor excluding renal pelvis 07/26/2009   s/p  right partial nephrectomy for angiomyolipoma   History of cervical cancer 03/1995   s/p   TAH /  BSO for poorly differentiated cervical carcinoma.   History of endometrial cancer 03/1995   s/p  TAH/ BSO for cervical cancer w/ incidental finding endometrial cancer also ,  completed radiation 11/ 1997   History of external beam radiation therapy 1997   completed abominal radation for endometrial cancer 11/ 1997   History of radiation therapy 10/12/2019   [facility] HDR brachytherapy  09/29/2019-10/12/2019  Dr Antony Blackbird  (recurrent VAIN)   History of resection of meningioma 11/1968   T12   History of small bowel obstruction 12/2008   partial SBO admission 12/2008;  recurrent SBO in 02/2011;  02/ 2017;   so far no surgical intervention (per pt has a narrowed distal colon)   Hyperlipidemia    Hypertension    Hypothyroidism    followed by pcp   Lichen sclerosus of vulva    per pt w/ chronic itching   Nocturia    Pre-diabetes    Primary cancer of left upper lobe of lung  08/01/2015   previously followed by oncologist--- dr Phill Myron--- released 09-13-2018  , pt non-smokerStage 1  Non-small cell ,  no chemo or radiation,  survillance   (bilateral nodules for noted in 2011)   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   GYN oncologist--- dr Pricilla Holm---- total vaginectomy and completed radiation 09/ 2021;    laser ablation 02/ 2023   Recurrent vulvar cancer 2016   GYN oncologist---- dr Pricilla Holm;   new dx 12/ 2016  s/p radica vulvectomy @ [facility] by dr Stanford Breed recurrent 10/ 2018 s/p modified vulvecotmy;  06/ 2021 invasive to vagina/ recurrent vulva s/p total vagainectomy & radiation; laser ablation 02/ 2023;   new lesion 12/ 2023 s/p simplec vulectomy w/ flap 01/ 2024    SURGICAL HISTORY: Past Surgical History:  Procedure Laterality Date   CATARACT EXTRACTION W/  INTRAOCULAR LENS IMPLANT Bilateral 2006   CO2 LASER APPLICATION N/A 03/20/2021   Procedure: CO2 LASER APPLICATION to vagina and vuvla;  Surgeon: Carver Fila, MD;  Location: [facility];  Service: Gynecology;  Laterality: N/A;   CO2 LASER APPLICATION N/A 05/07/2022   Procedure: POSSIBLE CO2 LASER APPLICATION TO VAGINA/VULVA;  Surgeon: Carver Fila, MD;  Location: [facility];  Service: Gynecology;  Laterality: N/A;   LYMPH NODE DISSECTION Left 08/01/2015   Procedure: LYMPH NODE DISSECTION;  Surgeon: Loreli Slot, MD;  Location: [facility] OR;  Service: Thoracic;  Laterality: Left;   RADICAL VULVECTOMY  02/06/2015   @[facility]  by dr Stanford Breed   ROBOTIC ASSITED PARTIAL NEPHRECTOMY  07/26/2009   @[facility]  by dr Laverle Patter   SEGMENTECOMY Left 08/01/2015   Procedure: Left Upper Lobe SEGMENTECTOMY;  Surgeon: Loreli Slot, MD;  Location: [facility] OR;  Service: Thoracic;  Laterality: Left;   TONSILLECTOMY AND ADENOIDECTOMY     child   TOTAL ABDOMINAL HYSTERECTOMY W/ BILATERAL SALPINGOOPHORECTOMY  03/31/1995   @[facility]  by dr Stanford Breed;   TAH/BSO pelvic and periaortic lymphadenectomy   TUBAL LIGATION Bilateral    yrs ago   TUMOR REMOVAL  11/1968   meningioma  T 12   VAGINECTOMY, PARTIAL N/A 08/11/2019   Procedure: TOTAL VAGINECTOMY;  Surgeon: Adolphus Birchwood, MD;  Location: [facility];  Service: Gynecology;  Laterality: N/A;   VAGINECTOMY, PARTIAL N/A 03/20/2021   Procedure: vaginal excision;  Surgeon: Carver Fila, MD;  Location: [facility];  Service: Gynecology;  Laterality: N/A;   VIDEO ASSISTED THORACOSCOPY (VATS)/WEDGE RESECTION Left 08/01/2015   Procedure: VIDEO ASSISTED THORACOSCOPY (VATS)/WEDGE RESECTION;  Surgeon: Loreli Slot, MD;  Location: [facility] OR;  Service: Thoracic;  Laterality: Left;   VULVA Ples Specter BIOPSY N/A 02/14/2021   Procedure: VAGINAL AND VULVAR BIOPSY;  Surgeon: Carver Fila, MD;  Location: [facility];  Service: Gynecology;  Laterality: N/A;   VULVA /PERINEUM BIOPSY N/A 05/07/2022   Procedure: POSSIBLE VAGINAL  BIOPSY;  Surgeon: Carver Fila, MD;  Location: [facility];  Service: Gynecology;  Laterality: N/A;   VULVAR LESION REMOVAL N/A 02/19/2022   Procedure: VULVAR LESION EXCISION, ROTATIONAL FLAP CLOSURE;  Surgeon: Carver Fila, MD;  Location: [facility];  Service: Gynecology;  Laterality: N/A;   VULVECTOMY N/A 05/07/2022   Procedure: POSSIBLE WIDE EXCISION VULVECTOMY/VAGINA;  Surgeon: Carver Fila, MD;  Location: [facility];  Service: Gynecology;  Laterality: N/A;   VULVECTOMY PARTIAL  01/13/2017   @[facility]  by dr Stanford Breed    SOCIAL HISTORY: Social History   Socioeconomic History   Marital status: Divorced  Spouse name: Not on file   Number of children: 2   Years of education: Not on file   Highest education level: Not on file  Occupational History   Not on file  Tobacco Use   Smoking status: Never   Smokeless tobacco: Never  Vaping Use   Vaping Use: Never used  Substance and Sexual Activity   Alcohol use: Not Currently    Comment: Occ   Drug use: Never   Sexual activity: Yes    Birth control/protection: Surgical  Other Topics Concern   Not on file  Social History Narrative   Lives at home alone, works out at [employer] 6 days a week, still very active. Has an ex-husband who she is in contact with. Son died of an MI and another son in [place].    Social Determinants of Health   Financial Resource Strain: Not on file  Food Insecurity: Not on file  Transportation Needs: Not on file  Physical Activity: Not on file  Stress: Not on file  Social Connections: Not on file  Intimate Partner Violence: Not on file    FAMILY HISTORY: Family History  Problem Relation Age of Onset   Hypertension Other    Diabetes Other    Stroke Other    CAD Other    Colon cancer Neg Hx    Breast cancer Neg Hx    Ovarian  cancer Neg Hx    Endometrial cancer Neg Hx    Pancreatic cancer Neg Hx    Prostate cancer Neg Hx     ALLERGIES:  is allergic to penicillins, fentanyl, codeine, compazine [prochlorperazine], percocet [oxycodone-acetaminophen], and trazodone hcl.  MEDICATIONS:  Current Outpatient Medications  Medication Sig Dispense Refill   acetaminophen (TYLENOL) 500 MG tablet Take 500-1,000 mg by mouth every 6 (six) hours as needed (pain.).     acyclovir (ZOVIRAX) 800 MG tablet Take 800 mg by mouth 2 (two) times daily as needed (cold sores).     albuterol (VENTOLIN HFA) 108 (90 Base) MCG/ACT inhaler Inhale 2 puffs into the lungs every 6 (six) hours as needed for wheezing or shortness of breath.     amLODipine (NORVASC) 5 MG tablet Take 5 mg by mouth every evening.     Ascorbic Acid (VITAMIN C PO) Take 500 mg by mouth in the morning.     benazepril-hydrochlorthiazide (LOTENSIN HCT) 20-25 MG tablet Take 1 tablet by mouth in the morning.     benzonatate (TESSALON) 200 MG capsule Take 200 mg by mouth 2 (two) times daily as needed for cough.     BIOTIN PO Take 1 tablet by mouth in the morning.     Cholecalciferol (VITAMIN D3 PO) Take 2,000 Units by mouth in the morning and at bedtime.     CRANBERRY PO Take 1 tablet by mouth in the morning.     docusate sodium (COLACE) 100 MG capsule Take 100 mg by mouth 2 (two) times daily.     doxylamine, Sleep, (UNISOM) 25 MG tablet Take 12.5 mg by mouth at bedtime.     estradiol (ESTRACE VAGINAL) 0.1 MG/GM vaginal cream Plan to beginning to use vaginal estrogen cream nightly at bedtime for 2 weeks then three times a week after to assist with healing in the vagina. You will need to use a fingertip size amount of cream on your finger and place slightly past the vaginal entrance at bedtime. Do not use the applicator if this comes with the cream. 42.5 g 3  fexofenadine (ALLEGRA) 180 MG tablet Take 180 mg by mouth daily.     ibuprofen (ADVIL) 200 MG tablet Take 200-600 mg by  mouth every 8 (eight) hours as needed (pain.).     levothyroxine (SYNTHROID, LEVOTHROID) 75 MCG tablet Take 75 mcg by mouth daily before breakfast.     MAGNESIUM PO Take 1 tablet by mouth every evening.     Multiple Vitamin (MULTIVITAMIN WITH MINERALS) TABS tablet Take 1 tablet by mouth in the morning.     Omega-3 Fatty Acids (FISH OIL PO) Take 1 capsule by mouth in the morning.     ondansetron (ZOFRAN) 8 MG tablet Take 1 tablet (8 mg total) by mouth every 8 (eight) hours as needed for nausea or vomiting. 30 tablet 1   Polyethyl Glycol-Propyl Glycol (SYSTANE ULTRA) 0.4-0.3 % SOLN Place 1-2 drops into both eyes 3 (three) times daily as needed (dry/irritated eyes.).     polyethylene glycol (MIRALAX / GLYCOLAX) packet Take 11.3333 g by mouth at bedtime. 2/3 capful     senna-docusate (SENOKOT-S) 8.6-50 MG tablet Take 2 tablets by mouth at bedtime. For AFTER surgery, do not take if having diarrhea 60 tablet 2   simvastatin (ZOCOR) 20 MG tablet Take 20 mg by mouth at bedtime.      TURMERIC PO Take 1 capsule by mouth every evening.     No current facility-administered medications for this visit.    REVIEW OF SYSTEMS:   Constitutional: Denies fevers, chills or abnormal night sweats Eyes: Denies blurriness of vision, double vision or watery eyes Ears, nose, mouth, throat, and face: Denies mucositis or sore throat Respiratory: Denies cough, dyspnea or wheezes Cardiovascular: Denies palpitation, chest discomfort or lower extremity swelling Gastrointestinal:  Denies nausea, heartburn or change in bowel habits Skin: Denies abnormal skin rashes Lymphatics: Denies new lymphadenopathy or easy bruising Neurological:Denies numbness, tingling or new weaknesses Behavioral/Psych: Mood is stable, no new changes  All other systems were reviewed with the patient and are negative.  PHYSICAL EXAMINATION: ECOG PERFORMANCE STATUS: 0 - Asymptomatic  Vitals:   06/03/22 1341  BP: (!) 164/58  Pulse: 62  Resp: 18   Temp: 98 F (36.7 C)  SpO2: 100%   Filed Weights   06/03/22 1341  Weight: 159 lb (72.1 kg)    GENERAL:alert, no distress and comfortable SKIN: skin color, texture, turgor are normal, no rashes or significant lesions EYES: normal, conjunctiva are pink and non-injected, sclera clear OROPHARYNX:no exudate, no erythema and lips, buccal mucosa, and tongue normal  NECK: supple, thyroid normal size, non-tender, without nodularity LYMPH:  no palpable lymphadenopathy in the cervical, axillary or inguinal LUNGS: clear to auscultation and percussion with normal breathing effort HEART: regular rate & rhythm and no murmurs and no lower extremity edema ABDOMEN:abdomen soft, non-tender and normal bowel sounds Musculoskeletal:no cyanosis of digits and no clubbing  PSYCH: alert & oriented x 3 with fluent speech NEURO: no focal motor/sensory deficits  LABORATORY DATA:  I have reviewed the data as listed Lab Results  Component Value Date   WBC 4.3 02/06/2022   HGB 13.3 05/07/2022   HCT 39.0 05/07/2022   MCV 68.3 (L) 02/06/2022   PLT 226 02/06/2022   Recent Labs    10/24/21 2130 02/06/22 1449 05/07/22 1010  NA 133* 132* 133*  K 3.4* 3.6 3.8  CL 93* 96* 95*  CO2 28 27  --   GLUCOSE 155* 100* 106*  BUN CREATININE 0.69 0.57 0.60  CALCIUM 10.1 9.2  --  GFRNONAA >60 >60  --   PROT 7.3  --   --   ALBUMIN 4.3  --   --   AST 20  --   --   ALT 21  --   --   ALKPHOS 91  --   --   BILITOT 1.0  --   --

## 2022-06-03 NOTE — Progress Notes (Signed)
START OFF PATHWAY REGIMEN - Other ° ° °OFF10391:Pembrolizumab 200 mg IV D1 q21 Days: °  A cycle is every 21 days: °    Pembrolizumab  ° °**Always confirm dose/schedule in your pharmacy ordering system** ° °Patient Characteristics: °Intent of Therapy: °Curative Intent, Discussed with Patient °

## 2022-06-03 NOTE — Assessment & Plan Note (Signed)
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

## 2022-06-04 ENCOUNTER — Other Ambulatory Visit: Payer: Self-pay

## 2022-06-05 ENCOUNTER — Encounter: Payer: Self-pay | Admitting: Hematology and Oncology

## 2022-06-05 ENCOUNTER — Other Ambulatory Visit: Payer: Self-pay | Admitting: Hematology and Oncology

## 2022-06-05 ENCOUNTER — Telehealth: Payer: Self-pay | Admitting: Hematology and Oncology

## 2022-06-05 DIAGNOSIS — C52 Malignant neoplasm of vagina: Secondary | ICD-10-CM

## 2022-06-05 NOTE — Telephone Encounter (Signed)
I spoke with the patient over the phone. After peer to peer review, her insurance company has declined first-line pembrolizumab and required her to try chemotherapy first; if she tolerated treatment poorly, then she would agree to be switched over to pembrolizumab I recommend a trial of carboplatin and the patient agrees to try 1 dose I will try to get insurance approval as soon as possible

## 2022-06-05 NOTE — Progress Notes (Signed)
DISCONTINUE OFF PATHWAY REGIMEN - Other   OFF10391:Pembrolizumab 200 mg IV D1 q21 Days:   A cycle is every 21 days:     Pembrolizumab   **Always confirm dose/schedule in your pharmacy ordering system**  REASON: Other Reason PRIOR TREATMENT: Pembrolizumab 200 mg IV D1 q21 Days TREATMENT RESPONSE: Unable to Evaluate  START OFF PATHWAY REGIMEN - Other   OFF02260:Carboplatin AUC=2:   Administer weekly:     Carboplatin   **Always confirm dose/schedule in your pharmacy ordering system**  Patient Characteristics: Intent of Therapy: Curative Intent, Discussed with Patient

## 2022-06-06 ENCOUNTER — Telehealth: Payer: Self-pay

## 2022-06-06 ENCOUNTER — Telehealth: Payer: Self-pay | Admitting: Hematology and Oncology

## 2022-06-06 MED FILL — Fosaprepitant Dimeglumine For IV Infusion 150 MG (Base Eq): INTRAVENOUS | Qty: 5 | Status: AC

## 2022-06-06 MED FILL — Dexamethasone Sodium Phosphate Inj 100 MG/10ML: INTRAMUSCULAR | Qty: 1 | Status: AC

## 2022-06-06 NOTE — Telephone Encounter (Signed)
Returned her call. She received a letter from [employer] and thought that Rande Lawman was approved. She read the letter while I was on the phone and realized that carboplatin is approved.  She is upset that she will not get [place] and her insurance has approved Carboplatin. Forwarded the above message to Dr. Bertis Ruddy.

## 2022-06-06 NOTE — Telephone Encounter (Signed)
Called and verified appt on Monday 4/29 at 2:30 pm. Appt will be 2 hours now due to change in treatment. She verbalized understanding.

## 2022-06-06 NOTE — Telephone Encounter (Signed)
I reviewed the plan of care with the patient again this morning as she is very upset that she got misinformation from her insurance company about [facility] approval After explaining to the patient the risk, benefits, side effects of treatment she agreed to proceed with carboplatin as scheduled

## 2022-06-09 ENCOUNTER — Inpatient Hospital Stay: Payer: Medicare Other

## 2022-06-09 VITALS — BP 157/72 | HR 68 | Temp 98.2°F | Resp 18 | Wt 159.2 lb

## 2022-06-09 DIAGNOSIS — Z85118 Personal history of other malignant neoplasm of bronchus and lung: Secondary | ICD-10-CM | POA: Diagnosis not present

## 2022-06-09 DIAGNOSIS — Z5111 Encounter for antineoplastic chemotherapy: Secondary | ICD-10-CM | POA: Diagnosis not present

## 2022-06-09 DIAGNOSIS — Z79899 Other long term (current) drug therapy: Secondary | ICD-10-CM | POA: Diagnosis not present

## 2022-06-09 DIAGNOSIS — Z923 Personal history of irradiation: Secondary | ICD-10-CM | POA: Diagnosis not present

## 2022-06-09 DIAGNOSIS — C52 Malignant neoplasm of vagina: Secondary | ICD-10-CM

## 2022-06-09 MED ORDER — PALONOSETRON HCL INJECTION 0.25 MG/5ML
0.2500 mg | Freq: Once | INTRAVENOUS | Status: AC
  Administered 2022-06-09: 0.25 mg via INTRAVENOUS
  Filled 2022-06-09: qty 5

## 2022-06-09 MED ORDER — SODIUM CHLORIDE 0.9 % IV SOLN
314.4000 mg | Freq: Once | INTRAVENOUS | Status: AC
  Administered 2022-06-09: 310 mg via INTRAVENOUS
  Filled 2022-06-09: qty 31

## 2022-06-09 MED ORDER — SODIUM CHLORIDE 0.9 % IV SOLN
10.0000 mg | Freq: Once | INTRAVENOUS | Status: AC
  Administered 2022-06-09: 10 mg via INTRAVENOUS
  Filled 2022-06-09: qty 10

## 2022-06-09 MED ORDER — SODIUM CHLORIDE 0.9 % IV SOLN: Freq: Once | INTRAVENOUS | Status: AC

## 2022-06-09 MED ORDER — SODIUM CHLORIDE 0.9 % IV SOLN
150.0000 mg | Freq: Once | INTRAVENOUS | Status: AC
  Administered 2022-06-09: 150 mg via INTRAVENOUS
  Filled 2022-06-09: qty 150

## 2022-06-09 NOTE — Patient Instructions (Signed)
[facility]  Discharge Instructions: Thank you for choosing [facility] to provide your oncology and hematology care.   If you have a lab appointment with the Cancer Center, please go directly to the Cancer Center and check in at the registration area.   Wear comfortable clothing and clothing appropriate for easy access to any Portacath or PICC line.   We strive to give you quality time with your provider. You may need to reschedule your appointment if you arrive late (15 or more minutes).  Arriving late affects you and other patients whose appointments are after yours.  Also, if you miss three or more appointments without notifying the office, you may be dismissed from the clinic at the provider's discretion.      For prescription refill requests, have your pharmacy contact our office and allow 72 hours for refills to be completed.    Today you received the following chemotherapy and/or immunotherapy agents carboplatin      To help prevent nausea and vomiting after your treatment, we encourage you to take your nausea medication as directed.  BELOW ARE SYMPTOMS THAT SHOULD BE REPORTED IMMEDIATELY: *FEVER GREATER THAN 100.4 F (38 C) OR HIGHER *CHILLS OR SWEATING *NAUSEA AND VOMITING THAT IS NOT CONTROLLED WITH YOUR NAUSEA MEDICATION *UNUSUAL SHORTNESS OF BREATH *UNUSUAL BRUISING OR BLEEDING *URINARY PROBLEMS (pain or burning when urinating, or frequent urination) *BOWEL PROBLEMS (unusual diarrhea, constipation, pain near the anus) TENDERNESS IN MOUTH AND THROAT WITH OR WITHOUT PRESENCE OF ULCERS (sore throat, sores in mouth, or a toothache) UNUSUAL RASH, SWELLING OR PAIN  UNUSUAL VAGINAL DISCHARGE OR ITCHING   Items with * indicate a potential emergency and should be followed up as soon as possible or go to the Emergency Department if any problems should occur.  Please show the CHEMOTHERAPY ALERT CARD or IMMUNOTHERAPY ALERT CARD at  check-in to the Emergency Department and triage nurse.  Should you have questions after your visit or need to cancel or reschedule your appointment, please contact [facility] [facility]  Dept: [phone]  and follow the prompts.  Office hours are 8:00 a.m. to 4:30 p.m. Monday - Friday. Please note that voicemails left after 4:00 p.m. may not be returned until the following business day.  We are closed weekends and major holidays. You have access to a nurse at all times for urgent questions. Please call the main number to the clinic Dept: [phone] and follow the prompts.   For any non-urgent questions, you may also contact your provider using MyChart. We now offer e-Visits for anyone 78 and older to request care online for non-urgent symptoms. For details visit [email or web address].   Also download the MyChart app! Go to the app store, search "MyChart", open the app, select , and log in with your MyChart username and password.

## 2022-06-10 ENCOUNTER — Encounter: Payer: Self-pay | Admitting: Hematology and Oncology

## 2022-06-10 ENCOUNTER — Telehealth: Payer: Self-pay | Admitting: Hematology and Oncology

## 2022-06-10 ENCOUNTER — Telehealth: Payer: Self-pay

## 2022-06-10 ENCOUNTER — Other Ambulatory Visit: Payer: Self-pay | Admitting: Hematology and Oncology

## 2022-06-10 DIAGNOSIS — C52 Malignant neoplasm of vagina: Secondary | ICD-10-CM

## 2022-06-10 NOTE — Telephone Encounter (Signed)
Called pt and LVM to make aware of plan. LOS sent.

## 2022-06-10 NOTE — Telephone Encounter (Signed)
Spoke with patients confirming upcoming appointments  

## 2022-06-10 NOTE — Telephone Encounter (Signed)
See notes from pt & RN/MD today.  Did not call pt since she has communicated with nurse today.  Plans to d/c current chemo & change to Pembrolizumab.  Dr Bertis Ruddy will see pt to discuss.

## 2022-06-10 NOTE — Telephone Encounter (Signed)
-----   Message from Edger House, RN sent at 06/09/2022  4:37 PM EDT ----- Regarding: FT Chemo/ [place] FT chemo- carboplatin- able to complete treatment. Bertis Ruddy

## 2022-06-10 NOTE — Telephone Encounter (Signed)
Next available appt is 5/14 morning, send LOS if she is interested to discuss I will cancel her treatment plan and change to [facility] and submit for new PA

## 2022-06-10 NOTE — Telephone Encounter (Signed)
Pt called and states after tx yesterday she felt severe abd cramping (she rated this ranging 4-8 out of 10 pain) and 3 episodes of runny stool. She also reports she was extremely light-headed but not disoriented or feeling like she was at risk for a fall. She states, "I will never take this chemo again." Pt is requesting Dr Bertis Ruddy order Rande Lawman and send in PA for that since she is unable to tolerate chemo.  Pt reports she is feeling better today; no diarrhea and abd cramps have subsided. States she is still a little lightheaded but not much. She is going to the gym in a few hours to do some seated cardio. She wants to know if she needs to come in to discuss this with Dr Bertis Ruddy before her next tx 5/20. Advised pt I would speak with MD and call her back.

## 2022-06-10 NOTE — Progress Notes (Signed)
DISCONTINUE OFF PATHWAY REGIMEN - Other   OFF02260:Carboplatin AUC=2:   Administer weekly:     Carboplatin   **Always confirm dose/schedule in your pharmacy ordering system**  REASON: Toxicities / Adverse Event PRIOR TREATMENT: Carboplatin AUC=2 TREATMENT RESPONSE: Unable to Evaluate  START OFF PATHWAY REGIMEN - Other   [id number]:Pembrolizumab 200 mg IV D1 q21 Days:   A cycle is every 21 days:     Pembrolizumab   **Always confirm dose/schedule in your pharmacy ordering system**  Patient Characteristics: Intent of Therapy: Curative Intent, Discussed with Patient

## 2022-06-15 ENCOUNTER — Encounter: Payer: Self-pay | Admitting: Gynecologic Oncology

## 2022-06-16 ENCOUNTER — Telehealth: Payer: Self-pay | Admitting: *Deleted

## 2022-06-16 NOTE — Telephone Encounter (Signed)
Patient called in regards to her message for Dr. Pricilla Holm about her perineal symptoms.   Relayed question to patient from Warner Mccreedy, NP asked patient if she is experiencing any vulva drainage?  Pt denies any drainage. States it's bright red and itchy. She also states it only burns when she has to scratch, "when it itches so bad" or when she rubs it.   Pt continues to use her peri bottle. She has tried lidocaine cream and it helps a little, but  "it's not healing the area" Pt has also tried vaseline which helps some but "it still doesn't heal the area. Pt purchased Vagisil and has been using that for a "day and a half" with little relief.   Advised patient that the office will get back with her and for her to call the office back if her symptoms persist and worsen.

## 2022-06-16 NOTE — Telephone Encounter (Signed)
Patient called back in regards to symptoms *see below note.   Per Warner Mccreedy, NP pt is to stop using the Vagisil. Pt is also advised not to put anything on her vulva. Avoid tight fitting close, especially yoga pants. Pt also to try airing out her perineal area using a fan especially after showering and the gym.  Pt advised to call the office if her symptoms worsen or persist.

## 2022-06-18 ENCOUNTER — Encounter: Payer: Self-pay | Admitting: Hematology and Oncology

## 2022-06-23 NOTE — Progress Notes (Signed)
Pharmacist Chemotherapy Monitoring - Initial Assessment    Anticipated start date: 06/30/22   The following has been reviewed per standard work regarding the patient's treatment regimen: The patient's diagnosis, treatment plan and drug doses, and organ/hematologic function Lab orders and baseline tests specific to treatment regimen  The treatment plan start date, drug sequencing, and pre-medications Prior authorization status  Patient's documented medication list, including drug-drug interaction screen and prescriptions for anti-emetics and supportive care specific to the treatment regimen The drug concentrations, fluid compatibility, administration routes, and timing of the medications to be used The patient's access for treatment and lifetime cumulative dose history, if applicable  The patient's medication allergies and previous infusion related reactions, if applicable   Changes made to treatment plan:  N/A  Follow up needed:  N/A   [name] Richards, RPH, 06/23/2022  3:31 PM

## 2022-06-24 ENCOUNTER — Encounter: Payer: Self-pay | Admitting: Hematology and Oncology

## 2022-06-24 ENCOUNTER — Inpatient Hospital Stay: Payer: Medicare Other | Attending: Gynecologic Oncology | Admitting: Hematology and Oncology

## 2022-06-24 VITALS — BP 160/50 | HR 69 | Temp 97.4°F | Resp 18 | Ht 65.0 in | Wt 160.4 lb

## 2022-06-24 DIAGNOSIS — I10 Essential (primary) hypertension: Secondary | ICD-10-CM | POA: Diagnosis not present

## 2022-06-24 DIAGNOSIS — Z90710 Acquired absence of both cervix and uterus: Secondary | ICD-10-CM | POA: Insufficient documentation

## 2022-06-24 DIAGNOSIS — C52 Malignant neoplasm of vagina: Secondary | ICD-10-CM | POA: Diagnosis not present

## 2022-06-24 DIAGNOSIS — Z5112 Encounter for antineoplastic immunotherapy: Secondary | ICD-10-CM | POA: Diagnosis not present

## 2022-06-24 DIAGNOSIS — Z90722 Acquired absence of ovaries, bilateral: Secondary | ICD-10-CM | POA: Diagnosis not present

## 2022-06-24 DIAGNOSIS — Z85118 Personal history of other malignant neoplasm of bronchus and lung: Secondary | ICD-10-CM | POA: Diagnosis not present

## 2022-06-24 DIAGNOSIS — Z79620 Long term (current) use of immunosuppressive biologic: Secondary | ICD-10-CM | POA: Insufficient documentation

## 2022-06-24 NOTE — Progress Notes (Signed)
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   All questions were answered. The patient knows to call the clinic with any problems, questions or concerns. The total time spent in the appointment was 20 minutes encounter with patients including review of chart and various tests results, discussions about plan of care and coordination of care plan   [name], MD 06/24/2022 9:03 AM  INTERVAL HISTORY: Please see below for problem oriented charting. she returns for treatment follow-up The patient has unpleasant side effects from carboplatin with significant changes in her energy, difficulties with sleeping, excessive fatigue, etc. She is now fully recovered We discussed future plan of care moving forward  REVIEW OF SYSTEMS:   Constitutional: Denies fevers, chills or abnormal weight loss Eyes: Denies blurriness of vision Ears, nose, mouth, throat, and face: Denies mucositis or sore throat Respiratory: Denies cough, dyspnea or wheezes Cardiovascular: Denies palpitation, chest discomfort or lower extremity swelling Gastrointestinal:  Denies nausea, heartburn or change in bowel habits Skin: Denies abnormal skin rashes Lymphatics: Denies new  lymphadenopathy or easy bruising Neurological:Denies numbness, tingling or new weaknesses Behavioral/Psych: Mood is stable, no new changes  All other systems were reviewed with the patient and are negative.  I have reviewed the past medical history, past surgical history, social history and family history with the patient and they are unchanged from previous note.  ALLERGIES:  is allergic to penicillins, fentanyl, codeine, compazine [prochlorperazine], percocet [oxycodone-acetaminophen], and trazodone hcl.  MEDICATIONS:  Current Outpatient Medications  Medication Sig Dispense Refill   acetaminophen (TYLENOL) 500 MG tablet Take 500-1,000 mg by mouth every 6 (six) hours as needed (pain.).     acyclovir (ZOVIRAX) 800 MG tablet Take 800 mg by mouth 2 (two) times daily as needed (cold sores).     albuterol (VENTOLIN HFA) 108 (90 Base) MCG/ACT inhaler Inhale 2 puffs into the lungs every 6 (six) hours as needed for wheezing or shortness of breath.     amLODipine (NORVASC) 5 MG tablet Take 5 mg by mouth every evening.     Ascorbic Acid (VITAMIN C PO) Take 500 mg by mouth in the morning.     benazepril-hydrochlorthiazide (LOTENSIN HCT) 20-25 MG tablet Take 1 tablet by mouth in the morning.     benzonatate (TESSALON) 200 MG capsule Take 200 mg by mouth 2 (two) times daily as needed for cough.     BIOTIN PO Take 1 tablet by mouth in the morning.     Cholecalciferol (VITAMIN D3 PO) Take 2,000 Units by mouth in the morning and at bedtime.     CRANBERRY PO Take 1 tablet by mouth in the morning.     docusate sodium (COLACE) 100 MG capsule Take 100 mg by mouth 2 (two) times daily.     doxylamine, Sleep, (UNISOM) 25 MG tablet Take 12.5 mg by mouth at  bedtime.     estradiol (ESTRACE VAGINAL) 0.1 MG/GM vaginal cream Plan to beginning to use vaginal estrogen cream nightly at bedtime for 2 weeks then three times a week after to assist with healing in the vagina. You will need to use a fingertip size amount of  cream on your finger and place slightly past the vaginal entrance at bedtime. Do not use the applicator if this comes with the cream. 42.5 g 3   fexofenadine (ALLEGRA) 180 MG tablet Take 180 mg by mouth daily.     ibuprofen (ADVIL) 200 MG tablet Take 200-600 mg by mouth every 8 (eight) hours as needed (pain.).     levothyroxine (SYNTHROID, LEVOTHROID) 75 MCG tablet Take 75 mcg by mouth daily before breakfast.     MAGNESIUM PO Take 1 tablet by mouth every evening.     Multiple Vitamin (MULTIVITAMIN WITH MINERALS) TABS tablet Take 1 tablet by mouth in the morning.     Omega-3 Fatty Acids (FISH OIL PO) Take 1 capsule by mouth in the morning.     Polyethyl Glycol-Propyl Glycol (SYSTANE ULTRA) 0.4-0.3 % SOLN Place 1-2 drops into both eyes 3 (three) times daily as needed (dry/irritated eyes.).     polyethylene glycol (MIRALAX / GLYCOLAX) packet Take 11.3333 g by mouth at bedtime. 2/3 capful     senna-docusate (SENOKOT-S) 8.6-50 MG tablet Take 2 tablets by mouth at bedtime. For AFTER surgery, do not take if having diarrhea 60 tablet 2   simvastatin (ZOCOR) 20 MG tablet Take 20 mg by mouth at bedtime.      TURMERIC PO Take 1 capsule by mouth every evening.     No current facility-administered medications for this visit.    SUMMARY OF ONCOLOGIC HISTORY: Oncology History Overview Note  Patient followed due to HX pulmonary nodules noted 2011  Primary cancer of left upper lobe of lung (HCC)   Staging form: Lung, AJCC 7th Edition   - Clinical stage from 09/27/2015: Stage IA (T1b, N0, M0) - Signed by Si Gaul, MD on 09/27/2015  PDL-1 90%    Primary cancer of left upper lobe of lung (HCC)  07/12/2015 Imaging   CT Chest IMPRESSION:  The solid component of the large part solid nodule in the left upper lobe measures 9 x 3 mm (mean diameter 6 mm.) persistent part solid nodules with solid components greater than or equal to 6 mm should be considered highly suspicious for pulmonary adenocarcinoma    08/01/2015 Initial Diagnosis   Primary cancer of left upper lobe of lung (HCC)   08/01/2015 Surgery   PROCEDURE:  Left video-assisted thoracoscopy,           Wedge resection,           Thoracoscopic lingular sparing left upper lobectomy           Lymph node dissection           On-Q local anesthetic catheter placement   08/01/2015 Pathology Results   Lung, resection (segmental or lobe), Left Upper Lobe - ADENOCARCINOMA, WELL DIFFERENTIATED, SPANNING 2.5 CM. - THE SURGICAL RESECTION MARGINS ARE NEGATIVE FOR CARCINOMA. 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 She underwent a total abdominal hysterectomy and bilateral salpingo-oophorectomy, pelvic and para-aortic lymphadenectomy. She received postoperative whole pelvis radiation therapy.    02/06/2015 Surgery   In December 2016 the patient was found to have a new primary squamous cell carcinoma the vulva undergoing a modified radical vulvectomy on 02/06/2015. She underwent a modified radical vulvectomy at [facility] on 02/06/2015. She was found to have a 3 cm vulvar lesion of the posterior vulva extending into the posterior vagina. A rhomboid flap was required to close the incision.  Final pathology showed some close surgical margins. However, the specimen margins were toward the time of surgery and it was Dr Nelwyn Salisbury impression that they were widely around the primary cancer. She discussed options with the patient and they agreed to monitor her closely  but not recommend any radiation therapy to the vulva.   01/13/2017 Surgery   She experienced recurrent vulvar cancer October 2018 managed by a small modified radical vulvectomy of the right side on January 13, 2017.  Depth of invasion was 4 mm all margins were negative    03/13/2017 PET scan   1. No evidence of recurrent or metastatic disease. 2. Aortic atherosclerosis (ICD10-170.0). Coronary artery calcification.   07/20/2019 Miscellaneous   Examination of the vulva during routine surveillance on 07/20/19 showed a friable, verrucous lesion measuring approximately 2 cm was seen on the anterior wall of the suburethral vagina extending to the introitus and wrapping around to the right mid vagina. This was concerning for recurrence and was biopsied at that appointment. 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    08/11/2019 Miscellaneous   On 08/11/19 she underwent total vaginectomy. Intraoperative findings were significant for a forshortened 3cm vagina, friable 4cm plaque on the anterior vagina extending to the right fornix and left posterior vaginal wall. This necessitated a total vaginectomy to resect the lesion grossly. Her tissues were extremely friable due to age, prior radiation, menopause and lichen sclerosis. There was unavoidable fragmentation of the tissues during the resection. All gross visible disease was removed. Surgery was uncomplicated, and she reported no postop pain.  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 The suspicious foci are present at the edges of tissue fragments.  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        09/13/2019 PET scan   1. No specific evidence of recurrent vulvar carcinoma or distant metastases. There is nonspecific activity in the  region of the vaginal introitus which could reflect urine contamination or vaginal recurrence. Correlation with physical examination recommended.  2. No abnormal thoracic activity status post left upper lobe wedge resection. 3. Coronary and Aortic Atherosclerosis (ICD10-I70.0).   09/29/2019 - 10/12/2019 Radiation Therapy   The patient received adjuvant vaginal brachytherapy between 09/29/2019 through 10/12/2019.     02/28/2021 PET scan   1. Interval decrease in degree of FDG uptake associated with the vaginal introitus. The SUV max on today's study is equal to 5.96 compared with 9.17 previously. 2. No highly concerning features to suggest nodal metastasis or distant metastatic disease. Mildly increased tracer uptake within and morphologically benign left inguinal lymph node is identified which warrants attention on follow-up imaging. 3. Stable postoperative changes within  the left upper lobe without signs to suggest locally recurrent tumor. 4. Aortic Atherosclerosis (ICD10-I70.0). Coronary artery calcifications.   03/11/2021 Initial Diagnosis   Recurrent vaginal cancer   03/20/2021 Procedure   03/20/21: Vaginectomy, CO2 laser of the vulva for recurrent vaginal cancer, high-grade vulvar dysplasia.  Vaginal excision showed squamous mucosa with ulceration and inflammation.  No malignancy or dysplasia identified.  Rare atypical "radiation" fibroblast embedded.    10/24/2021 Imaging   1. No acute abnormality of the abdomen or pelvis. 2. Aortic Atherosclerosis (ICD10-I70.0).   02/19/2022 Surgery   Preop Diagnosis: history of vulvar and vaginal cancer, suspicion for recurrent vulvar cancer   Postoperative Diagnosis: same as above   Surgery: Partial simple right/posterior vulvectomy with rotational rhomboid flap    Surgeons: Carin Hock MD    Pathology: posterior vulva (6-8 o'c) with stitch at 12, additional superior, lateral and inferior margins, deep margin   Operative findings: 2 x 2.5 cm  verrucous appearing lesion along the perineum and extending to the introitus, relatively broad 2 cm base. Given size of defect and its location, required rotational flap to close defect. Hyperkeratotic areas within the significantly foreshortened vagina.   03/13/2022 PET scan    ADDENDUM REPORT: 03/14/2022 08:44   ADDENDUM: I just discussed this study with Dr. Pricilla Holm. The patient had surgery about 3 weeks ago for recurrent disease at the vaginal introitus with no residual disease apparent clinically. Additionally, the patient has had no chemotherapy in the 1 year interval since the prior study and no radiation therapy to the left groin region. In light of this additional history, the changes in the region the vaginal introitus today may well simply represent urinary contaminant rather than residual disease. Further, given lack of interval systemic chemotherapy and no interval focused therapy to the left groin lymph node, the interval stability in this node (9 mm previously versus 10 mm today) and stable low level FDG accumulation ( SUV max = 2.9 today compared to 3.0 previously) would suggest a reactive etiology as more likely, especially in light of the recurrent surgeries to the external genitalia.    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  Approximately 1 x 1 cm raised mass along the right distal vagina from 6-8 o'clock.  Second raised mass along the left vaginal sidewall extending almost up to the vaginal apex measuring approximately 1 x 1.5 cm.   Several small, less than 5 mm, masses along the vaginal apex and superior wall of the posterior vagina.  Well-healed lumbar incision from recent surgery.  Significant loss of architecture of the vulva with erythema noted of bilateral anterior labia and along the perineum.  Multiple biopsies taken including 2 and 10:00 along the vulva and at 5:00 along the posterior vulva.     05/07/2022 Pathology Results   CASE: [id number] [name] Surgical Pathology Report  Clinical History: Recurrent vulvar cancer (crm)  FINAL MICROSCOPIC DIAGNOSIS:  A. VAGINAL LESION, DISTAL RIGHT, EXCISION:      Invasive squamous cell carcinoma.      Cauterized carcinoma present at the mucosal margin. Deep margin  are negative for carcinoma.  B. VAGINAL SIDEWALL, LEFT, EXCISION:      Invasive squamous cell carcinoma.      Cauterized carcinoma present at the mucosal margin. Deep margin are negative for carcinoma.  C. VAGINA, APEX, EXCISION:      At least squamous cell carcinoma in situ.  D. VAGINA, POSTERIOR, BIOPSY:      At least squamous cell carcinoma in situ.  E. VULVA, RIGHT, BIOPSY:      Squamous mucosa with acute inflammation and reactive epithelial changes.      Negative for dysplasia or malignancy.  F. VULVA, LEFT, BIOPSY:      Squamous mucosa with acute and chronic inflammation and reactive epithelial changes.      Negative for dysplasia or malignancy.  G. VULVA, POSTERIOR, 5:00, BIOPSY:      Squamous mucosa with acute and chronic inflammation and reactive epithelial changes.      Negative for dysplasia or malignancy.     06/03/2022 Cancer Staging   Staging form: Vagina, AJCC 8th Edition - Clinical: cT1, cN0, cM0 - Signed by [name], MD on 06/03/2022   06/09/2022 - 06/09/2022 Chemotherapy   Patient is on Treatment Plan : UTERINE Pembrolizumab (200) q21d     06/09/2022 - 06/09/2022 Chemotherapy   Patient is on Treatment Plan : UTERINE Carboplatin (AUC 5) q21d     06/30/2022 -   Chemotherapy   Patient is on Treatment Plan : UTERINE Pembrolizumab (200) q21d       PHYSICAL EXAMINATION: ECOG PERFORMANCE STATUS: 1 - Symptomatic but completely ambulatory  Vitals:   06/24/22 0839  BP: (!) 160/50  Pulse: 69  Resp: 18  Temp: (!) 97.4 F (36.3 C)  SpO2: 100%   Filed Weights   06/24/22 0839  Weight: 160 lb 6.4 oz (72.8 kg)    GENERAL:alert, no distress and comfortable  NEURO: alert & oriented x 3 with fluent speech, no focal motor/sensory deficits  LABORATORY DATA:  I have reviewed the data as listed    Component Value Date/Time   NA 132 (L) 06/03/2022 1436   NA 136 09/18/2016 1536   K 3.5 06/03/2022 1436   K 3.9 09/18/2016 1536   CL 94 (L) 06/03/2022 1436   CO2 27 06/03/2022 1436   CO2 26 09/18/2016 1536   GLUCOSE 97 06/03/2022 1436   GLUCOSE 172 (H) 09/18/2016 1536   BUN 15 06/03/2022 1436   BUN 15.8 09/18/2016 1536   CREATININE 0.63 06/03/2022 1436   CREATININE 0.8 09/18/2016 1536   CALCIUM 10.0 06/03/2022 1436   CALCIUM 9.3 09/18/2016 1536   PROT 7.8 06/03/2022 1436   PROT 6.3 (L) 09/18/2016 1536   ALBUMIN 4.8 06/03/2022 1436   ALBUMIN 3.6 09/18/2016 1536   AST 19 06/03/2022 1436   AST 18 09/18/2016 1536   ALT 19 06/03/2022 1436   ALT 17 09/18/2016 1536   ALKPHOS 101 06/03/2022 1436   ALKPHOS 91 09/18/2016 1536   BILITOT 0.4 06/03/2022 1436   BILITOT 0.42 09/18/2016 1536   GFRNONAA >60 06/03/2022 1436   GFRAA >60 08/04/2019 0901   GFRAA >60 09/13/2018 0808    No results found for: "SPEP", "UPEP"  Lab Results  Component Value Date   WBC 5.5 06/03/2022   NEUTROABS 3.8 06/03/2022   HGB 12.7 06/03/2022   HCT 40.1 06/03/2022   MCV 67.2 (L) 06/03/2022   PLT 252 06/03/2022      Chemistry      Component Value Date/Time   NA 132 (L) 06/03/2022  1436   NA 136 09/18/2016 1536   K 3.5 06/03/2022 1436   K 3.9 09/18/2016 1536   CL 94 (L) 06/03/2022 1436   CO2 27 06/03/2022 1436   CO2 26 09/18/2016 1536   BUN 15 06/03/2022 1436    BUN 15.8 09/18/2016 1536   CREATININE 0.63 06/03/2022 1436   CREATININE 0.8 09/18/2016 1536      Component Value Date/Time   CALCIUM 10.0 06/03/2022 1436   CALCIUM 9.3 09/18/2016 1536   ALKPHOS 101 06/03/2022 1436   ALKPHOS 91 09/18/2016 1536   AST 19 06/03/2022 1436   AST 18 09/18/2016 1536   ALT 19 06/03/2022 1436   ALT 17 09/18/2016 1536   BILITOT 0.4 06/03/2022 1436   BILITOT 0.42 09/18/2016 1536

## 2022-06-24 NOTE — Assessment & Plan Note (Signed)
She could not tolerate further treatment with carboplatin and her treatment is now switched over to pembrolizumab Her insurance company has approved it We will proceed with treatment for minimum of 2-3 cycles before repeating imaging studies

## 2022-06-24 NOTE — Assessment & Plan Note (Signed)
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

## 2022-06-26 DIAGNOSIS — M1712 Unilateral primary osteoarthritis, left knee: Secondary | ICD-10-CM | POA: Diagnosis not present

## 2022-06-26 DIAGNOSIS — M545 Low back pain, unspecified: Secondary | ICD-10-CM | POA: Diagnosis not present

## 2022-06-30 ENCOUNTER — Inpatient Hospital Stay: Payer: Medicare Other | Admitting: Hematology and Oncology

## 2022-06-30 ENCOUNTER — Inpatient Hospital Stay: Payer: Medicare Other

## 2022-06-30 VITALS — BP 148/72 | HR 55 | Temp 98.2°F | Resp 16 | Wt 161.5 lb

## 2022-06-30 DIAGNOSIS — I10 Essential (primary) hypertension: Secondary | ICD-10-CM | POA: Diagnosis not present

## 2022-06-30 DIAGNOSIS — Z79620 Long term (current) use of immunosuppressive biologic: Secondary | ICD-10-CM | POA: Diagnosis not present

## 2022-06-30 DIAGNOSIS — C52 Malignant neoplasm of vagina: Secondary | ICD-10-CM

## 2022-06-30 DIAGNOSIS — Z5112 Encounter for antineoplastic immunotherapy: Secondary | ICD-10-CM | POA: Diagnosis not present

## 2022-06-30 DIAGNOSIS — Z85118 Personal history of other malignant neoplasm of bronchus and lung: Secondary | ICD-10-CM | POA: Diagnosis not present

## 2022-06-30 LAB — CBC WITH DIFFERENTIAL (CANCER CENTER ONLY)
Abs Immature Granulocytes: 0.01 10*3/uL (ref 0.00–0.07)
Basophils Absolute: 0 10*3/uL (ref 0.0–0.1)
Basophils Relative: 0 %
Eosinophils Absolute: 0 10*3/uL (ref 0.0–0.5)
Eosinophils Relative: 1 %
HCT: 36.3 % (ref 36.0–46.0)
Hemoglobin: 11.7 g/dL — ABNORMAL LOW (ref 12.0–15.0)
Immature Granulocytes: 0 %
Lymphocytes Relative: 24 %
Lymphs Abs: 1.1 10*3/uL (ref 0.7–4.0)
MCH: 21.7 pg — ABNORMAL LOW (ref 26.0–34.0)
MCHC: 32.2 g/dL (ref 30.0–36.0)
MCV: 67.3 fL — ABNORMAL LOW (ref 80.0–100.0)
Monocytes Absolute: 0.4 10*3/uL (ref 0.1–1.0)
Monocytes Relative: 8 %
Neutro Abs: 3.1 10*3/uL (ref 1.7–7.7)
Neutrophils Relative %: 67 %
Platelet Count: 177 10*3/uL (ref 150–400)
RBC: 5.39 MIL/uL — ABNORMAL HIGH (ref 3.87–5.11)
RDW: 15.4 % (ref 11.5–15.5)
WBC Count: 4.7 10*3/uL (ref 4.0–10.5)
nRBC: 0 % (ref 0.0–0.2)

## 2022-06-30 LAB — CMP (CANCER CENTER ONLY)
ALT: 17 U/L (ref 0–44)
AST: 20 U/L (ref 15–41)
Albumin: 4.3 g/dL (ref 3.5–5.0)
Alkaline Phosphatase: 90 U/L (ref 38–126)
Anion gap: 10 (ref 5–15)
BUN: 19 mg/dL (ref 8–23)
CO2: 27 mmol/L (ref 22–32)
Calcium: 9.1 mg/dL (ref 8.9–10.3)
Chloride: 94 mmol/L — ABNORMAL LOW (ref 98–111)
Creatinine: 0.57 mg/dL (ref 0.44–1.00)
GFR, Estimated: >60 mL/min (ref >60)
Glucose, Bld: 85 mg/dL (ref 70–99)
Potassium: 3.6 mmol/L (ref 3.5–5.1)
Sodium: 131 mmol/L — ABNORMAL LOW (ref 135–145)
Total Bilirubin: 0.4 mg/dL (ref 0.3–1.2)
Total Protein: 6.5 g/dL (ref 6.5–8.1)

## 2022-06-30 LAB — TSH: TSH: 1.123 u[IU]/mL (ref 0.350–4.500)

## 2022-06-30 MED ORDER — SODIUM CHLORIDE 0.9 % IV SOLN: Freq: Once | INTRAVENOUS | Status: AC

## 2022-06-30 MED ORDER — SODIUM CHLORIDE 0.9 % IV SOLN
200.0000 mg | Freq: Once | INTRAVENOUS | Status: AC
  Administered 2022-06-30: 200 mg via INTRAVENOUS
  Filled 2022-06-30: qty 200

## 2022-06-30 NOTE — Patient Instructions (Signed)
[facility]  Discharge Instructions: Thank you for choosing [facility] to provide your oncology and hematology care.   If you have a lab appointment with the Cancer Center, please go directly to the Cancer Center and check in at the registration area.   Wear comfortable clothing and clothing appropriate for easy access to any Portacath or PICC line.   We strive to give you quality time with your provider. You may need to reschedule your appointment if you arrive late (15 or more minutes).  Arriving late affects you and other patients whose appointments are after yours.  Also, if you miss three or more appointments without notifying the office, you may be dismissed from the clinic at the provider's discretion.      For prescription refill requests, have your pharmacy contact our office and allow 72 hours for refills to be completed.    Today you received the following chemotherapy and/or immunotherapy agents: Keytruda (pembrolizumab)      To help prevent nausea and vomiting after your treatment, we encourage you to take your nausea medication as directed.  BELOW ARE SYMPTOMS THAT SHOULD BE REPORTED IMMEDIATELY: *FEVER GREATER THAN 100.4 F (38 C) OR HIGHER *CHILLS OR SWEATING *NAUSEA AND VOMITING THAT IS NOT CONTROLLED WITH YOUR NAUSEA MEDICATION *UNUSUAL SHORTNESS OF BREATH *UNUSUAL BRUISING OR BLEEDING *URINARY PROBLEMS (pain or burning when urinating, or frequent urination) *BOWEL PROBLEMS (unusual diarrhea, constipation, pain near the anus) TENDERNESS IN MOUTH AND THROAT WITH OR WITHOUT PRESENCE OF ULCERS (sore throat, sores in mouth, or a toothache) UNUSUAL RASH, SWELLING OR PAIN  UNUSUAL VAGINAL DISCHARGE OR ITCHING   Items with * indicate a potential emergency and should be followed up as soon as possible or go to the Emergency Department if any problems should occur.  Please show the CHEMOTHERAPY ALERT CARD or IMMUNOTHERAPY  ALERT CARD at check-in to the Emergency Department and triage nurse.  Should you have questions after your visit or need to cancel or reschedule your appointment, please contact [facility]  Dept: [phone]  and follow the prompts.  Office hours are 8:00 a.m. to 4:30 p.m. Monday - Friday. Please note that voicemails left after 4:00 p.m. may not be returned until the following business day.  We are closed weekends and major holidays. You have access to a nurse at all times for urgent questions. Please call the main number to the clinic Dept: [phone] and follow the prompts.   For any non-urgent questions, you may also contact your provider using MyChart. We now offer e-Visits for anyone 18 and older to request care online for non-urgent symptoms. For details visit [email or web address].   Also download the MyChart app! Go to the app store, search "MyChart", open the app, select [facility], and log in with your MyChart username and password.  Pembrolizumab Injection What is this medication? PEMBROLIZUMAB (PEM broe LIZ ue mab) treats some types of cancer. It works by helping your immune system slow or stop the spread of cancer cells. It is a monoclonal antibody. This medicine may be used for other purposes; ask your health care provider or pharmacist if you have questions. COMMON BRAND NAME(S): Keytruda What should I tell my care team before I take this medication? They need to know if you have any of these conditions: Allogeneic stem cell transplant (uses someone else's stem cells) Autoimmune diseases, such as Crohn disease, ulcerative colitis, lupus History of chest radiation   Nervous system problems, such as Guillain-Barre syndrome, myasthenia gravis Organ transplant An unusual or allergic reaction to pembrolizumab, other medications, foods, dyes, or preservatives Pregnant or trying to get pregnant Breast-feeding How should I use this  medication? This medication is injected into a vein. It is given by your care team in a hospital or clinic setting. A special MedGuide will be given to you before each treatment. Be sure to read this information carefully each time. Talk to your care team about the use of this medication in children. While it may be prescribed for children as young as 6 months for selected conditions, precautions do apply. Overdosage: If you think you have taken too much of this medicine contact a poison control center or emergency room at once. NOTE: This medicine is only for you. Do not share this medicine with others. What if I miss a dose? Keep appointments for follow-up doses. It is important not to miss your dose. Call your care team if you are unable to keep an appointment. What may interact with this medication? Interactions have not been studied. This list may not describe all possible interactions. Give your health care provider a list of all the medicines, herbs, non-prescription drugs, or dietary supplements you use. Also tell them if you smoke, drink alcohol, or use illegal drugs. Some items may interact with your medicine. What should I watch for while using this medication? Your condition will be monitored carefully while you are receiving this medication. You may need blood work while taking this medication. This medication may cause serious skin reactions. They can happen weeks to months after starting the medication. Contact your care team right away if you notice fevers or flu-like symptoms with a rash. The rash may be red or purple and then turn into blisters or peeling of the skin. You may also notice a red rash with swelling of the face, lips, or lymph nodes in your neck or under your arms. Tell your care team right away if you have any change in your eyesight. Talk to your care team if you may be pregnant. Serious birth defects can occur if you take this medication during pregnancy and for 4  months after the last dose. You will need a negative pregnancy test before starting this medication. Contraception is recommended while taking this medication and for 4 months after the last dose. Your care team can help you find the option that works for you. Do not breastfeed while taking this medication and for 4 months after the last dose. What side effects may I notice from receiving this medication? Side effects that you should report to your care team as soon as possible: Allergic reactions--skin rash, itching, hives, swelling of the face, lips, tongue, or throat Dry cough, shortness of breath or trouble breathing Eye pain, redness, irritation, or discharge with blurry or decreased vision Heart muscle inflammation--unusual weakness or fatigue, shortness of breath, chest pain, fast or irregular heartbeat, dizziness, swelling of the ankles, feet, or hands Hormone gland problems--headache, sensitivity to light, unusual weakness or fatigue, dizziness, fast or irregular heartbeat, increased sensitivity to cold or heat, excessive sweating, constipation, hair loss, increased thirst or amount of urine, tremors or shaking, irritability Infusion reactions--chest pain, shortness of breath or trouble breathing, feeling faint or lightheaded Kidney injury (glomerulonephritis)--decrease in the amount of urine, red or dark brown urine, foamy or bubbly urine, swelling of the ankles, hands, or feet Liver injury--right upper belly pain, loss of appetite, nausea, light-colored stool, dark yellow   or brown urine, yellowing skin or eyes, unusual weakness or fatigue Pain, tingling, or numbness in the hands or feet, muscle weakness, change in vision, confusion or trouble speaking, loss of balance or coordination, trouble walking, seizures Rash, fever, and swollen lymph nodes Redness, blistering, peeling, or loosening of the skin, including inside the mouth Sudden or severe stomach pain, bloody diarrhea, fever, nausea,  vomiting Side effects that usually do not require medical attention (report to your care team if they continue or are bothersome): Bone, joint, or muscle pain Diarrhea Fatigue Loss of appetite Nausea Skin rash This list may not describe all possible side effects. Call your doctor for medical advice about side effects. You may report side effects to FDA at 1-800-FDA-1088. Where should I keep my medication? This medication is given in a hospital or clinic. It will not be stored at home. NOTE: This sheet is a summary. It may not cover all possible information. If you have questions about this medicine, talk to your doctor, pharmacist, or health care provider.  2023 Elsevier/Gold Standard (2021-06-11 00:00:00)    

## 2022-07-01 ENCOUNTER — Encounter: Payer: Self-pay | Admitting: Hematology and Oncology

## 2022-07-01 ENCOUNTER — Telehealth: Payer: Self-pay

## 2022-07-01 LAB — T4: T4, Total: 9.7 ug/dL (ref 4.5–12.0)

## 2022-07-01 NOTE — Telephone Encounter (Signed)
She called and left a message. She had first time Keytruda yesterday. She is doing well with no side effects. She will call the office back for questions/ concerns.

## 2022-07-01 NOTE — Telephone Encounter (Signed)
Called pt & left message to return call to let us know how she is doing post treatment.

## 2022-07-01 NOTE — Telephone Encounter (Signed)
-----   Message from Mickeal Needy, RN sent at 06/30/2022  5:11 PM EDT ----- 06/30/22- First time [name] Richards. Patient tolerated well.

## 2022-07-02 ENCOUNTER — Encounter: Payer: Self-pay | Admitting: Gynecologic Oncology

## 2022-07-03 ENCOUNTER — Ambulatory Visit: Payer: Medicare Other | Admitting: Gynecologic Oncology

## 2022-07-04 ENCOUNTER — Other Ambulatory Visit: Payer: Self-pay | Admitting: Gynecologic Oncology

## 2022-07-04 ENCOUNTER — Encounter: Payer: Self-pay | Admitting: Hematology and Oncology

## 2022-07-04 DIAGNOSIS — N9089 Other specified noninflammatory disorders of vulva and perineum: Secondary | ICD-10-CM

## 2022-07-04 DIAGNOSIS — H35371 Puckering of macula, right eye: Secondary | ICD-10-CM | POA: Diagnosis not present

## 2022-07-04 MED ORDER — LIDOCAINE 5 % EX OINT
1.0000 | TOPICAL_OINTMENT | Freq: Three times a day (TID) | CUTANEOUS | 2 refills | Status: DC | PRN
Start: 2022-07-04 — End: 2022-07-18

## 2022-07-04 NOTE — Telephone Encounter (Signed)
Ms. Reczek states that clobetasol does not help her. She will try the lidocaine. Melissa will send in prescription today. Instructed Ms Schul to try medication and call back to the office if not effective in relieving the itching and burning. Pt verbalized understanding.

## 2022-07-04 NOTE — Telephone Encounter (Signed)
LM for Ms Slominski stating that Warner Mccreedy, NP said that she can send in clobetasol cream or lidocaine. We have to try one medication at a time to see which one is effective. Requested that she call back to the office to discuss.

## 2022-07-04 NOTE — Progress Notes (Signed)
See note from RN

## 2022-07-08 ENCOUNTER — Telehealth: Payer: Self-pay | Admitting: *Deleted

## 2022-07-08 ENCOUNTER — Telehealth: Payer: Self-pay

## 2022-07-08 DIAGNOSIS — M4805 Spinal stenosis, thoracolumbar region: Secondary | ICD-10-CM | POA: Diagnosis not present

## 2022-07-08 NOTE — Telephone Encounter (Signed)
Attempted to reach [name] Richards in regards to Warner Mccreedy, NP message. Left voicemail for patient to call the office at [phone].

## 2022-07-08 NOTE — Telephone Encounter (Signed)
Patient called and stated "I picked up the lidocaine ointment. I used it that the A&D ointment this weekend. It did not help. The A&D ointment melts has soon as I apply it since that area is so warm. I used the clobetasol this weekend and this morning I had some bleeding at the site. I am going on vacation soon and need to get this under control." Explained that the message would be given to [facility] APP and the office will call her back.

## 2022-07-08 NOTE — Telephone Encounter (Signed)
RN called and spoke to patient regarding voicemail left for [facility]. Per patient, she believes that the Rande Lawman is exacerbating her autoimmune disorders and her symptoms are very uncomfortable (see prior phone notes about patient's vaginal irritation). [facility], but it was agreed that the situation would best be handled by Dr. Winferd Humphrey office as they are already communicating with patient. Dr. Maxine Glenn nurse was notified of situation. No further questions at this time.

## 2022-07-09 ENCOUNTER — Encounter: Payer: Self-pay | Admitting: Gynecologic Oncology

## 2022-07-09 ENCOUNTER — Other Ambulatory Visit: Payer: Self-pay | Admitting: Gynecologic Oncology

## 2022-07-09 ENCOUNTER — Telehealth: Payer: Self-pay

## 2022-07-09 DIAGNOSIS — L900 Lichen sclerosus et atrophicus: Secondary | ICD-10-CM

## 2022-07-09 MED ORDER — TACROLIMUS 0.1 % EX OINT
TOPICAL_OINTMENT | Freq: Two times a day (BID) | CUTANEOUS | 0 refills | Status: DC
Start: 2022-07-09 — End: 2022-08-08

## 2022-07-09 NOTE — Telephone Encounter (Signed)
Spoke with [name] Richards in regards to her symptoms. Patient states her vulva area, "is still beet red" it alternates between being painful & itchy."  "I have tried A & D ointment and that doesn't help. I stopped using the clobetasol because that has thinned out my tissue so much" Patient states she has used the clobetasol since 1998 on & off and just recently had only been using it two-three times a week. 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   Patient states she has had 1 dose of Keytruda  which has worsened her symptoms because of her autoimmune disorder and can cause "flare up"  Patient states she has an appt. On June 7th. For her next infusion and follow up with Dr. Bertis Ruddy.   Patient is currently using only vaseline on her vulva tissue. Patient states it melts immediately when she applies it and is asking if there is anything else she can use. Patient is going to the gym daily and is very active and is "dealing with it" but is frustrated because the area has not changed and she is taking a trip out of the country to [place].  Patient states she is wearing cotton underpants and changes them often. She is using a fan to help keep the area dry, but "it never is dry"  Patient denies any vaginal bleeding, discharge, fever or chills.   Patient states she tried the lidocaine cream as well but upon application, "it burns terribly and its just to uncomfortable"  Patient is not wearing any form of peri pad or panty liners. Patient states a friend told her to try, "triamcinolone ointment"  Pt advised not to try any ointments without Rx or approval from a provider.    Patient wants to explore treatment options if any, but doesn't want anymore surgeries. Patient advised that her message of concerns would be sent to Dr. Pricilla Holm and Dr. Maxine Glenn nurse as well.

## 2022-07-09 NOTE — Telephone Encounter (Signed)
Called and left below message from Dr. Bertis Ruddy. Ask her to call the office back for questions/concerns.

## 2022-07-09 NOTE — Telephone Encounter (Signed)
Dr. Pricilla Holm is going to try something different for her GYN symptoms I do not believe this is caused by [facility]. I have nothing to add

## 2022-07-09 NOTE — Telephone Encounter (Signed)
Spoke with [name] Richards and relayed message from Dr. Pricilla Holm & Warner Mccreedy, NP. Patient thanked the office for calling and states she will pick up the Rx. For Tacrolimus. Patient instructed to use the ointment twice a day for 6 weeks then stop. And to stop using all other creams or ointments accept for Vaseline -if needed. Patient receptive to instructions. Patient aware that Warner Mccreedy, NP sent information about Rx. To her MyChart.  Reminded patient of her follow up appointment with Dr. Pricilla Holm on July 25th.   Patient advised to call the office sooner with any questions or concerns. Pt receptive, and no further questions at this time.

## 2022-07-09 NOTE — Telephone Encounter (Signed)
Pts new prescription for Tacrolimus 0.1% ointment needs prior authorization.  Called [phone] to initiate PA.  Spoke with Lanora Manis about pt information regarding prescription. Prescription is under pharmacy review and status will be updated within 72 hours.   Reference #: [id number]  Phone number to check updates is the same from above.

## 2022-07-09 NOTE — Telephone Encounter (Signed)
Called her regarding call that she made to [facility] yesterday and GYN office. She does not want to stop Keytruda. A few days after Keytruda infusion she had a flare up from her Lichen sclerosus. She also has lichen planus that effects her gums. After recent Keytruda, she has red white spots on her gums. She had these before [place] but feels that it may be worse. She has had vaginal itching and redness for months. With complaints of itching and burning. Dr. Pricilla Holm told her to stop applying everything to vaginal area. She has tried lidocaine cream most recently and in the past used A& D, [place] and Clobetasol. She wears cotton underwear, no panty liner and try's to let her vaginal area dry out when possible at home. The only thing that she is applying to her vaginal area now is Vaseline 3-4 times a day.  She is going of vacation on 6/10 to [place] for 10 days and is asking if Dr. Bertis Ruddy has any recommendation.

## 2022-07-09 NOTE — Telephone Encounter (Signed)
I just sent in a prescription for Tacrolimus - this is a topical medication that can be used in refractory lichen sclerosus. Please have her stop everything else topical except barrier cream (like vaseline) if she needs it. She will use this twice a day. We will plan to try this for 6 weeks and then have her stop treatment. Melissa, could you please send her some patient information in [place] about this? Thank you both

## 2022-07-11 ENCOUNTER — Telehealth: Payer: Self-pay | Admitting: *Deleted

## 2022-07-11 ENCOUNTER — Encounter: Payer: Self-pay | Admitting: Hematology and Oncology

## 2022-07-11 ENCOUNTER — Other Ambulatory Visit (HOSPITAL_COMMUNITY): Payer: Self-pay

## 2022-07-11 MED ORDER — TACROLIMUS 0.1 % EX OINT
TOPICAL_OINTMENT | Freq: Two times a day (BID) | CUTANEOUS | 1 refills | Status: DC
  Filled 2022-07-11: qty 30, 21d supply, fill #0

## 2022-07-11 NOTE — Telephone Encounter (Signed)
Spoke with Ms. Probst in regards to her Rx. For Tacrolimus (out of pocket expense $852.99) that was denied through her insurance company. Relayed message from [facility] that they could fill the Rx.  for 30 gm as opposed to 100 gm tube for additional savings to the patient. The out of pocket expense would be $115.57. Patient agreed to this and instructed that Dr. Pricilla Holm would send in the order for the Rx. Patient advised to follow instructions as prescribed to use the ointment twice daily for 6 weeks.  Pt Thanked the office and has no further concerns or questions at this time.

## 2022-07-14 ENCOUNTER — Encounter: Payer: Self-pay | Admitting: Hematology and Oncology

## 2022-07-17 DIAGNOSIS — M4805 Spinal stenosis, thoracolumbar region: Secondary | ICD-10-CM | POA: Diagnosis not present

## 2022-07-18 ENCOUNTER — Other Ambulatory Visit: Payer: Self-pay

## 2022-07-18 ENCOUNTER — Encounter: Payer: Self-pay | Admitting: Hematology and Oncology

## 2022-07-18 ENCOUNTER — Inpatient Hospital Stay: Payer: Medicare Other

## 2022-07-18 ENCOUNTER — Inpatient Hospital Stay: Payer: Medicare Other | Attending: Gynecologic Oncology | Admitting: Hematology and Oncology

## 2022-07-18 VITALS — BP 145/70 | HR 53 | Resp 17

## 2022-07-18 VITALS — BP 156/78 | HR 65 | Temp 97.8°F | Resp 18 | Ht 65.0 in | Wt 158.8 lb

## 2022-07-18 DIAGNOSIS — Z5112 Encounter for antineoplastic immunotherapy: Secondary | ICD-10-CM | POA: Insufficient documentation

## 2022-07-18 DIAGNOSIS — C52 Malignant neoplasm of vagina: Secondary | ICD-10-CM | POA: Diagnosis not present

## 2022-07-18 DIAGNOSIS — I10 Essential (primary) hypertension: Secondary | ICD-10-CM | POA: Insufficient documentation

## 2022-07-18 DIAGNOSIS — Z85118 Personal history of other malignant neoplasm of bronchus and lung: Secondary | ICD-10-CM | POA: Diagnosis not present

## 2022-07-18 LAB — CBC WITH DIFFERENTIAL (CANCER CENTER ONLY)
Abs Immature Granulocytes: 0.02 10*3/uL (ref 0.00–0.07)
Basophils Absolute: 0 10*3/uL (ref 0.0–0.1)
Basophils Relative: 1 %
Eosinophils Absolute: 0 10*3/uL (ref 0.0–0.5)
Eosinophils Relative: 1 %
HCT: 37.6 % (ref 36.0–46.0)
Hemoglobin: 11.9 g/dL — ABNORMAL LOW (ref 12.0–15.0)
Immature Granulocytes: 0 %
Lymphocytes Relative: 21 %
Lymphs Abs: 1.3 10*3/uL (ref 0.7–4.0)
MCH: 21.6 pg — ABNORMAL LOW (ref 26.0–34.0)
MCHC: 31.6 g/dL (ref 30.0–36.0)
MCV: 68.2 fL — ABNORMAL LOW (ref 80.0–100.0)
Monocytes Absolute: 0.6 10*3/uL (ref 0.1–1.0)
Monocytes Relative: 9 %
Neutro Abs: 4 10*3/uL (ref 1.7–7.7)
Neutrophils Relative %: 68 %
Platelet Count: 285 10*3/uL (ref 150–400)
RBC: 5.51 MIL/uL — ABNORMAL HIGH (ref 3.87–5.11)
RDW: 16.1 % — ABNORMAL HIGH (ref 11.5–15.5)
WBC Count: 5.9 10*3/uL (ref 4.0–10.5)
nRBC: 0 % (ref 0.0–0.2)

## 2022-07-18 LAB — CMP (CANCER CENTER ONLY)
ALT: 14 U/L (ref 0–44)
AST: 14 U/L — ABNORMAL LOW (ref 15–41)
Albumin: 4.5 g/dL (ref 3.5–5.0)
Alkaline Phosphatase: 90 U/L (ref 38–126)
Anion gap: 7 (ref 5–15)
BUN: 19 mg/dL (ref 8–23)
CO2: 29 mmol/L (ref 22–32)
Calcium: 9.6 mg/dL (ref 8.9–10.3)
Chloride: 95 mmol/L — ABNORMAL LOW (ref 98–111)
Creatinine: 0.67 mg/dL (ref 0.44–1.00)
GFR, Estimated: >60 mL/min (ref >60)
Glucose, Bld: 96 mg/dL (ref 70–99)
Potassium: 3.8 mmol/L (ref 3.5–5.1)
Sodium: 131 mmol/L — ABNORMAL LOW (ref 135–145)
Total Bilirubin: 0.4 mg/dL (ref 0.3–1.2)
Total Protein: 7.1 g/dL (ref 6.5–8.1)

## 2022-07-18 MED ORDER — SODIUM CHLORIDE 0.9 % IV SOLN
200.0000 mg | Freq: Once | INTRAVENOUS | Status: AC
  Administered 2022-07-18: 200 mg via INTRAVENOUS
  Filled 2022-07-18: qty 8

## 2022-07-18 MED ORDER — SODIUM CHLORIDE 0.9 % IV SOLN: Freq: Once | INTRAVENOUS | Status: AC

## 2022-07-18 NOTE — Assessment & Plan Note (Addendum)
She tolerated recent treatment better I do not feel that the flare of lichen planus is related to [facility] She is comfortable to proceed with treatment as scheduled We will proceed with treatment for minimum of 3-4 cycles before repeating imaging studies

## 2022-07-18 NOTE — Assessment & Plan Note (Signed)
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 

## 2022-07-18 NOTE — Progress Notes (Signed)
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   All questions were answered. The patient knows to call the clinic with any problems, questions or concerns. The total time spent in the appointment was 20 minutes encounter with patients including review of chart and various tests results, discussions about plan of care and coordination of care plan   [name] Delay, MD 07/18/2022 1:16 PM  INTERVAL HISTORY: Please see below for problem oriented charting. she returns for treatment follow-up She tolerated recent treatment well Denies recent nausea She had recent flare of lichen planus which she attributed to possible stress She has appointment to see [name] next month for evaluation  REVIEW OF SYSTEMS:   Constitutional: Denies fevers, chills or abnormal weight loss Eyes: Denies blurriness of vision Ears, nose, mouth, throat, and face: Denies mucositis or sore throat Respiratory: Denies cough, dyspnea or wheezes Cardiovascular: Denies palpitation, chest discomfort or lower extremity swelling Gastrointestinal:  Denies nausea, heartburn or change in bowel habits Skin: Denies abnormal skin rashes Lymphatics: Denies new lymphadenopathy or easy  bruising Neurological:Denies numbness, tingling or new weaknesses Behavioral/Psych: Mood is stable, no new changes  All other systems were reviewed with the patient and are negative.  I have reviewed the past medical history, past surgical history, social history and family history with the patient and they are unchanged from previous note.  ALLERGIES:  is allergic to penicillins, fentanyl, codeine, compazine [prochlorperazine], percocet [oxycodone-acetaminophen], and trazodone hcl.  MEDICATIONS:  Current Outpatient Medications  Medication Sig Dispense Refill   acetaminophen (TYLENOL) 500 MG tablet Take 500-1,000 mg by mouth every 6 (six) hours as needed (pain.).     acyclovir (ZOVIRAX) 800 MG tablet Take 800 mg by mouth 2 (two) times daily as needed (cold sores).     albuterol (VENTOLIN HFA) 108 (90 Base) MCG/ACT inhaler Inhale 2 puffs into the lungs every 6 (six) hours as needed for wheezing or shortness of breath.     amLODipine (NORVASC) 5 MG tablet Take 5 mg by mouth every evening.     Ascorbic Acid (VITAMIN C PO) Take 500 mg by mouth in the morning.     benazepril-hydrochlorthiazide (LOTENSIN HCT) 20-25 MG tablet Take 1 tablet by mouth in the morning.     benzonatate (TESSALON) 200 MG capsule Take 200 mg by mouth 2 (two) times daily as needed for cough.     BIOTIN PO Take 1 tablet by mouth in the morning.     Cholecalciferol (VITAMIN D3 PO) Take 2,000 Units by mouth in the morning and at bedtime.     CRANBERRY PO Take 1 tablet by mouth in the morning.     docusate sodium (COLACE) 100 MG capsule Take 100 mg by mouth 2 (two) times daily.     doxylamine, Sleep, (UNISOM) 25 MG tablet Take 12.5  mg by mouth at bedtime.     estradiol (ESTRACE VAGINAL) 0.1 MG/GM vaginal cream Plan to beginning to use vaginal estrogen cream nightly at bedtime for 2 weeks then three times a week after to assist with healing in the vagina. You will need to use a fingertip size amount of cream on your finger and  place slightly past the vaginal entrance at bedtime. Do not use the applicator if this comes with the cream. 42.5 g 3   fexofenadine (ALLEGRA) 180 MG tablet Take 180 mg by mouth daily.     ibuprofen (ADVIL) 200 MG tablet Take 200-600 mg by mouth every 8 (eight) hours as needed (pain.).     levothyroxine (SYNTHROID, LEVOTHROID) 75 MCG tablet Take 75 mcg by mouth daily before breakfast.     lidocaine (XYLOCAINE) 5 % ointment Apply 1 Application topically 3 (three) times daily as needed (vulvar discomfort). 35.44 g 2   MAGNESIUM PO Take 1 tablet by mouth every evening.     Multiple Vitamin (MULTIVITAMIN WITH MINERALS) TABS tablet Take 1 tablet by mouth in the morning.     Omega-3 Fatty Acids (FISH OIL PO) Take 1 capsule by mouth in the morning.     Polyethyl Glycol-Propyl Glycol (SYSTANE ULTRA) 0.4-0.3 % SOLN Place 1-2 drops into both eyes 3 (three) times daily as needed (dry/irritated eyes.).     polyethylene glycol (MIRALAX / GLYCOLAX) packet Take 11.3333 g by mouth at bedtime. 2/3 capful     senna-docusate (SENOKOT-S) 8.6-50 MG tablet Take 2 tablets by mouth at bedtime. For AFTER surgery, do not take if having diarrhea 60 tablet 2   simvastatin (ZOCOR) 20 MG tablet Take 20 mg by mouth at bedtime.      tacrolimus (PROTOPIC) 0.1 % ointment Apply topically 2 (two) times daily. Use twice a day for 6 weeks 100 g 0   tacrolimus (PROTOPIC) 0.1 % ointment Apply topically 2 (two) times daily for 6 weeks. 30 g 1   TURMERIC PO Take 1 capsule by mouth every evening.     No current facility-administered medications for this visit.    SUMMARY OF ONCOLOGIC HISTORY: Oncology History Overview Note  Patient followed due to HX pulmonary nodules noted 2011  Primary cancer of left upper lobe of lung (HCC)   Staging form: Lung, AJCC 7th Edition   - Clinical stage from 09/27/2015: Stage IA (T1b, N0, M0) - Signed by Si Gaul, MD on 09/27/2015  PDL-1 90%    Primary cancer of left upper lobe of lung (HCC)   07/12/2015 Imaging   CT Chest IMPRESSION:  The solid component of the large part solid nodule in the left upper lobe measures 9 x 3 mm (mean diameter 6 mm.) persistent part solid nodules with solid components greater than or equal to 6 mm should be considered highly suspicious for pulmonary adenocarcinoma   08/01/2015 Initial Diagnosis   Primary cancer of left upper lobe of lung (HCC)   08/01/2015 Surgery   PROCEDURE:  Left video-assisted thoracoscopy,           Wedge resection,           Thoracoscopic lingular sparing left upper lobectomy           Lymph node dissection           On-Q local anesthetic catheter placement   08/01/2015 Pathology Results   Lung, resection (segmental or lobe), Left Upper Lobe - ADENOCARCINOMA, WELL DIFFERENTIATED, SPANNING 2.5 CM. - THE SURGICAL RESECTION MARGINS ARE NEGATIVE  FOR CARCINOMA. 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 She underwent a total abdominal hysterectomy and bilateral salpingo-oophorectomy, pelvic and para-aortic lymphadenectomy. She received postoperative whole pelvis radiation therapy.    02/06/2015 Surgery   In December 2016 the patient was found to have a new primary squamous cell carcinoma the vulva undergoing a modified radical vulvectomy on 02/06/2015. She underwent a modified radical vulvectomy at [facility] on 02/06/2015. She was found to have a 3 cm vulvar lesion of the posterior vulva extending into the posterior vagina. A rhomboid flap was  required to close the incision.  Final pathology showed some close surgical margins. However, the specimen margins were toward the time of surgery and it was Dr Nelwyn Salisbury impression that they were widely around the primary cancer. She discussed options with the patient and they agreed to monitor her closely but not recommend any radiation therapy to the vulva.   01/13/2017 Surgery   She experienced recurrent vulvar cancer October 2018 managed by a small modified radical vulvectomy of the right side on January 13, 2017.  Depth of invasion was 4 mm all margins were negative    03/13/2017 PET scan   1. No evidence of recurrent or metastatic disease. 2. Aortic atherosclerosis (ICD10-170.0). Coronary artery calcification.   07/20/2019 Miscellaneous   Examination of the vulva during routine surveillance on 07/20/19 showed a friable, verrucous lesion measuring approximately 2 cm was seen on the anterior wall of the suburethral vagina extending to the introitus and wrapping around to the right mid vagina. This was concerning for recurrence and was biopsied at that appointment. 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    08/11/2019 Miscellaneous   On 08/11/19 she underwent total vaginectomy. Intraoperative findings were significant for a forshortened 3cm vagina, friable 4cm plaque on the anterior vagina extending to the right fornix and left posterior vaginal wall. This necessitated a total vaginectomy to resect the lesion grossly. Her tissues were extremely friable due to age, prior radiation, menopause and lichen sclerosis. There was unavoidable fragmentation of the tissues during the resection. All gross visible disease was removed. Surgery was uncomplicated, and she reported no postop pain.  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 The suspicious foci are present at the edges of tissue fragments.  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        09/13/2019 PET scan   1. No specific evidence of recurrent vulvar carcinoma or distant metastases. There is nonspecific activity in the region of the vaginal introitus which could reflect urine contamination or vaginal recurrence. Correlation with physical examination recommended.  2. No abnormal thoracic activity status post left upper lobe wedge resection. 3. Coronary and Aortic Atherosclerosis (ICD10-I70.0).   09/29/2019 - 10/12/2019 Radiation Therapy   The patient received adjuvant vaginal brachytherapy between 09/29/2019 through 10/12/2019.     02/28/2021 PET  scan   1. Interval decrease in degree of FDG uptake associated with the vaginal introitus. The SUV max on today's study is equal to 5.96 compared with 9.17 previously. 2. No highly concerning features to suggest nodal metastasis or distant metastatic disease. Mildly increased tracer uptake within and morphologically benign left inguinal lymph node is identified which warrants attention on follow-up imaging. 3. Stable postoperative changes within the left upper lobe without signs to suggest locally recurrent tumor. 4. Aortic Atherosclerosis (ICD10-I70.0). Coronary artery calcifications.   03/11/2021 Initial Diagnosis   Recurrent vaginal cancer   03/20/2021 Procedure   03/20/21: Vaginectomy, CO2 laser of the vulva for recurrent vaginal cancer, high-grade vulvar dysplasia.  Vaginal excision showed squamous mucosa with ulceration and inflammation.  No malignancy or dysplasia identified.  Rare atypical "radiation" fibroblast embedded.    10/24/2021 Imaging   1. No acute abnormality of the abdomen or pelvis. 2. Aortic Atherosclerosis (ICD10-I70.0).   02/19/2022 Surgery   Preop Diagnosis: history of vulvar and vaginal cancer, suspicion for recurrent vulvar cancer    Postoperative Diagnosis: same as above   Surgery: Partial simple right/posterior vulvectomy with rotational rhomboid flap    Surgeons: Carin Hock MD    Pathology: posterior vulva (6-8 o'c) with stitch at 12, additional superior, lateral and inferior margins, deep margin   Operative findings: 2 x 2.5 cm verrucous appearing lesion along the perineum and extending to the introitus, relatively broad 2 cm base. Given size of defect and its location, required rotational flap to close defect. Hyperkeratotic areas within the significantly foreshortened vagina.   03/13/2022 PET scan    ADDENDUM REPORT: 03/14/2022 08:44   ADDENDUM: I just discussed this study with [name]. The patient had surgery about 3 weeks ago for recurrent disease at the vaginal introitus with no residual disease apparent clinically. Additionally, the patient has had no chemotherapy in the 1 year interval since the prior study and no radiation therapy to the left groin region. In light of this additional history, the changes in the region the vaginal introitus today may well simply represent urinary contaminant rather than residual disease. Further, given lack of interval systemic chemotherapy and no interval focused therapy to the left groin lymph node, the interval stability in this node (9 mm previously versus 10 mm today) and stable low level FDG accumulation ( SUV max = 2.9 today compared to 3.0 previously) would suggest a reactive etiology as more likely, especially in light of the recurrent surgeries to the external genitalia.    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  Approximately 1 x 1 cm raised mass along the right distal vagina from 6-8 o'clock.  Second raised mass along the left vaginal sidewall extending almost up to the vaginal apex measuring approximately 1 x 1.5 cm.  Several small, less than 5 mm, masses along the vaginal apex and superior wall of the posterior vagina.  Well-healed lumbar incision from recent surgery.  Significant loss of architecture of the vulva with erythema noted of bilateral anterior labia and along the perineum.  Multiple biopsies taken including 2 and 10:00  along the vulva and at 5:00 along the posterior vulva.     05/07/2022 Pathology Results   CASE: [id number] [name] Surgical Pathology Report  Clinical History: Recurrent vulvar cancer (crm)  FINAL MICROSCOPIC DIAGNOSIS:  A. VAGINAL LESION, DISTAL RIGHT, EXCISION:      Invasive squamous cell carcinoma.      Cauterized carcinoma present at the mucosal margin. Deep margin are negative for carcinoma.  B. VAGINAL SIDEWALL, LEFT, EXCISION:      Invasive squamous cell carcinoma.      Cauterized carcinoma present at the mucosal margin. Deep margin are negative for carcinoma.  C. VAGINA, APEX, EXCISION:      At least squamous cell carcinoma in situ.  D. VAGINA, POSTERIOR, BIOPSY:      At least squamous cell carcinoma in situ.  E. VULVA, RIGHT, BIOPSY:      Squamous mucosa with acute inflammation and reactive epithelial changes.      Negative for dysplasia or malignancy.  F. VULVA, LEFT, BIOPSY:      Squamous mucosa with acute and chronic inflammation and reactive epithelial changes.      Negative for dysplasia or malignancy.  G. VULVA, POSTERIOR, 5:00, BIOPSY:      Squamous mucosa with acute and chronic inflammation and reactive epithelial changes.      Negative for dysplasia or malignancy.     06/03/2022 Cancer Staging   Staging form:  Vagina, AJCC 8th Edition - Clinical: cT1, cN0, cM0 - Signed by [name] Delay, MD on 06/03/2022   06/09/2022 - 06/09/2022 Chemotherapy   Patient is on Treatment Plan : UTERINE Pembrolizumab (200) q21d     06/09/2022 - 06/09/2022 Chemotherapy   Patient is on Treatment Plan : UTERINE Carboplatin (AUC 5) q21d     06/30/2022 -  Chemotherapy   Patient is on Treatment Plan : UTERINE Pembrolizumab (200) q21d       PHYSICAL EXAMINATION: ECOG PERFORMANCE STATUS: 0 - Asymptomatic  Vitals:   07/18/22 1312  BP: (!) 156/78  Pulse: 65  Resp: 18  Temp: 97.8 F (36.6 C)  SpO2: 100%   Filed Weights   07/18/22 1312  Weight: 158 lb 12.8 oz (72 kg)    GENERAL:alert, no distress and comfortable NEURO: alert & oriented x 3 with fluent speech, no focal motor/sensory deficits  LABORATORY DATA:  I have reviewed the data as listed    Component Value Date/Time   NA 131 (L) 06/30/2022 1327   NA 136 09/18/2016 1536   K 3.6 06/30/2022 1327   K 3.9 09/18/2016 1536   CL 94 (L) 06/30/2022 1327   CO2 27 06/30/2022 1327   CO2 26 09/18/2016 1536   GLUCOSE 85 06/30/2022 1327   GLUCOSE 172 (H) 09/18/2016 1536   BUN 19 06/30/2022 1327   BUN 15.8 09/18/2016 1536   CREATININE 0.57 06/30/2022 1327   CREATININE 0.8 09/18/2016 1536   CALCIUM 9.1 06/30/2022 1327   CALCIUM 9.3 09/18/2016 1536   PROT 6.5 06/30/2022 1327   PROT 6.3 (L) 09/18/2016 1536   ALBUMIN 4.3 06/30/2022 1327   ALBUMIN 3.6 09/18/2016 1536   AST 20 06/30/2022 1327   AST 18 09/18/2016 1536   ALT 17 06/30/2022 1327   ALT 17 09/18/2016 1536   ALKPHOS 90 06/30/2022 1327   ALKPHOS 91 09/18/2016 1536   BILITOT 0.4 06/30/2022 1327   BILITOT 0.42 09/18/2016 1536   GFRNONAA >60 06/30/2022 1327   GFRAA >60 08/04/2019 0901   GFRAA >60 09/13/2018 0808    No  results found for: "SPEP", "UPEP"  Lab Results  Component Value Date   WBC 4.7 06/30/2022   NEUTROABS 3.1 06/30/2022   HGB 11.7 (L) 06/30/2022   HCT 36.3 06/30/2022   MCV 67.3 (L)  06/30/2022   PLT 177 06/30/2022      Chemistry      Component Value Date/Time   NA 131 (L) 06/30/2022 1327   NA 136 09/18/2016 1536   K 3.6 06/30/2022 1327   K 3.9 09/18/2016 1536   CL 94 (L) 06/30/2022 1327   CO2 27 06/30/2022 1327   CO2 26 09/18/2016 1536   BUN 19 06/30/2022 1327   BUN 15.8 09/18/2016 1536   CREATININE 0.57 06/30/2022 1327   CREATININE 0.8 09/18/2016 1536      Component Value Date/Time   CALCIUM 9.1 06/30/2022 1327   CALCIUM 9.3 09/18/2016 1536   ALKPHOS 90 06/30/2022 1327   ALKPHOS 91 09/18/2016 1536   AST 20 06/30/2022 1327   AST 18 09/18/2016 1536   ALT 17 06/30/2022 1327   ALT 17 09/18/2016 1536   BILITOT 0.4 06/30/2022 1327   BILITOT 0.42 09/18/2016 1536

## 2022-08-05 DIAGNOSIS — M4805 Spinal stenosis, thoracolumbar region: Secondary | ICD-10-CM | POA: Diagnosis not present

## 2022-08-06 DIAGNOSIS — R7303 Prediabetes: Secondary | ICD-10-CM | POA: Diagnosis not present

## 2022-08-06 DIAGNOSIS — E785 Hyperlipidemia, unspecified: Secondary | ICD-10-CM | POA: Diagnosis not present

## 2022-08-06 DIAGNOSIS — E559 Vitamin D deficiency, unspecified: Secondary | ICD-10-CM | POA: Diagnosis not present

## 2022-08-06 DIAGNOSIS — E039 Hypothyroidism, unspecified: Secondary | ICD-10-CM | POA: Diagnosis not present

## 2022-08-06 DIAGNOSIS — Z131 Encounter for screening for diabetes mellitus: Secondary | ICD-10-CM | POA: Diagnosis not present

## 2022-08-06 DIAGNOSIS — I10 Essential (primary) hypertension: Secondary | ICD-10-CM | POA: Diagnosis not present

## 2022-08-07 ENCOUNTER — Telehealth: Payer: Self-pay

## 2022-08-07 NOTE — Telephone Encounter (Signed)
Returned her call. She had labs 6/26 at PCP. Labs can be seen in care everywhere. Lab appt canceled for tomorrow. Left her a message.

## 2022-08-08 ENCOUNTER — Encounter: Payer: Self-pay | Admitting: Hematology and Oncology

## 2022-08-08 ENCOUNTER — Inpatient Hospital Stay: Payer: Medicare Other

## 2022-08-08 ENCOUNTER — Inpatient Hospital Stay: Payer: Medicare Other | Admitting: Hematology and Oncology

## 2022-08-08 ENCOUNTER — Other Ambulatory Visit: Payer: Self-pay

## 2022-08-08 VITALS — BP 138/71 | HR 56

## 2022-08-08 DIAGNOSIS — E785 Hyperlipidemia, unspecified: Secondary | ICD-10-CM | POA: Diagnosis not present

## 2022-08-08 DIAGNOSIS — C52 Malignant neoplasm of vagina: Secondary | ICD-10-CM

## 2022-08-08 DIAGNOSIS — I700 Atherosclerosis of aorta: Secondary | ICD-10-CM | POA: Diagnosis not present

## 2022-08-08 DIAGNOSIS — F5101 Primary insomnia: Secondary | ICD-10-CM | POA: Diagnosis not present

## 2022-08-08 DIAGNOSIS — Z85118 Personal history of other malignant neoplasm of bronchus and lung: Secondary | ICD-10-CM | POA: Diagnosis not present

## 2022-08-08 DIAGNOSIS — Z0000 Encounter for general adult medical examination without abnormal findings: Secondary | ICD-10-CM | POA: Diagnosis not present

## 2022-08-08 DIAGNOSIS — R7303 Prediabetes: Secondary | ICD-10-CM | POA: Diagnosis not present

## 2022-08-08 DIAGNOSIS — M8588 Other specified disorders of bone density and structure, other site: Secondary | ICD-10-CM | POA: Diagnosis not present

## 2022-08-08 DIAGNOSIS — I10 Essential (primary) hypertension: Secondary | ICD-10-CM | POA: Diagnosis not present

## 2022-08-08 DIAGNOSIS — J45991 Cough variant asthma: Secondary | ICD-10-CM | POA: Diagnosis not present

## 2022-08-08 DIAGNOSIS — Z5112 Encounter for antineoplastic immunotherapy: Secondary | ICD-10-CM | POA: Diagnosis not present

## 2022-08-08 DIAGNOSIS — E559 Vitamin D deficiency, unspecified: Secondary | ICD-10-CM | POA: Diagnosis not present

## 2022-08-08 DIAGNOSIS — E039 Hypothyroidism, unspecified: Secondary | ICD-10-CM | POA: Diagnosis not present

## 2022-08-08 MED ORDER — SODIUM CHLORIDE 0.9 % IV SOLN
200.0000 mg | Freq: Once | INTRAVENOUS | Status: AC
  Administered 2022-08-08: 200 mg via INTRAVENOUS
  Filled 2022-08-08: qty 8

## 2022-08-08 MED ORDER — SODIUM CHLORIDE 0.9 % IV SOLN: Freq: Once | INTRAVENOUS | Status: AC

## 2022-08-08 NOTE — Progress Notes (Signed)
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   All questions were answered. The patient knows to call the clinic with any problems, questions or concerns. The total time spent in the appointment was 25 minutes encounter with patients including review of chart and various tests results, discussions about plan of care and coordination of care plan   [name], MD 08/08/2022 1:46 PM  INTERVAL HISTORY: Please see below for problem oriented charting. she returns for treatment follow-up on treatment with Keytruda She is doing well She has no further flare of her recent vaginal symptoms She tolerated treatment well with no side effects whatsoever  REVIEW OF SYSTEMS:   Constitutional: Denies fevers, chills or abnormal weight loss Eyes: Denies blurriness of vision Ears, nose, mouth, throat, and face: Denies mucositis or sore throat Respiratory: Denies cough, dyspnea or wheezes Cardiovascular: Denies palpitation, chest discomfort or lower extremity swelling Gastrointestinal:  Denies nausea, heartburn or change in bowel habits Skin: Denies abnormal skin rashes Lymphatics: Denies new lymphadenopathy or easy bruising Neurological:Denies numbness, tingling or new weaknesses Behavioral/Psych: Mood is stable, no new changes  All other systems were reviewed with the patient and are negative.  I have reviewed the past medical history, past surgical history, social history and family history with the  patient and they are unchanged from previous note.  ALLERGIES:  is allergic to penicillins, fentanyl, codeine, compazine [prochlorperazine], percocet [oxycodone-acetaminophen], and trazodone hcl.  MEDICATIONS:  Current Outpatient Medications  Medication Sig Dispense Refill   acetaminophen (TYLENOL) 500 MG tablet Take 500-1,000 mg by mouth every 6 (six) hours as needed (pain.).     acyclovir (ZOVIRAX) 800 MG tablet Take 800 mg by mouth 2 (two) times daily as needed (cold sores).     albuterol (VENTOLIN HFA) 108 (90 Base) MCG/ACT inhaler Inhale 2 puffs into the lungs every 6 (six) hours as needed for wheezing or shortness of breath.     amLODipine (NORVASC) 5 MG tablet Take 5 mg by mouth every evening.     Ascorbic Acid (VITAMIN C PO) Take 500 mg by mouth in the morning.     benazepril-hydrochlorthiazide (LOTENSIN HCT) 20-25 MG tablet Take 1 tablet by mouth in the morning.     benzonatate (TESSALON) 200 MG capsule Take 200 mg by mouth 2 (two) times daily as needed for cough.     BIOTIN PO Take 1 tablet by mouth in the morning.     Cholecalciferol (VITAMIN D3 PO) Take 2,000 Units by mouth in the morning and at bedtime.     docusate sodium (COLACE) 100 MG capsule Take 100 mg by mouth 2 (two) times daily.     doxylamine, Sleep, (UNISOM) 25 MG tablet Take 12.5 mg by mouth at bedtime.     estradiol (ESTRACE VAGINAL) 0.1 MG/GM vaginal cream Plan to beginning to use vaginal estrogen cream nightly at bedtime for 2 weeks then three times a week after to assist with healing in the vagina. You will need to use a fingertip size amount of cream on your finger and place slightly past  the vaginal entrance at bedtime. Do not use the applicator if this comes with the cream. 42.5 g 3   fexofenadine (ALLEGRA) 180 MG tablet Take 180 mg by mouth daily.     ibuprofen (ADVIL) 200 MG tablet Take 200-600 mg by mouth every 8 (eight) hours as needed (pain.).     levothyroxine (SYNTHROID, LEVOTHROID) 75 MCG tablet Take 75  mcg by mouth daily before breakfast.     MAGNESIUM PO Take 1 tablet by mouth every evening.     Multiple Vitamin (MULTIVITAMIN WITH MINERALS) TABS tablet Take 1 tablet by mouth in the morning.     Omega-3 Fatty Acids (FISH OIL PO) Take 1 capsule by mouth in the morning.     Polyethyl Glycol-Propyl Glycol (SYSTANE ULTRA) 0.4-0.3 % SOLN Place 1-2 drops into both eyes 3 (three) times daily as needed (dry/irritated eyes.).     polyethylene glycol (MIRALAX / GLYCOLAX) packet Take 11.3333 g by mouth at bedtime. 2/3 capful     simvastatin (ZOCOR) 20 MG tablet Take 20 mg by mouth at bedtime.      tacrolimus (PROTOPIC) 0.1 % ointment Apply topically 2 (two) times daily. Use twice a day for 6 weeks 100 g 0   tacrolimus (PROTOPIC) 0.1 % ointment Apply topically 2 (two) times daily for 6 weeks. 30 g 1   TURMERIC PO Take 1 capsule by mouth every evening.     No current facility-administered medications for this visit.   Facility-Administered Medications Ordered in Other Visits  Medication Dose Route Frequency Provider Last Rate Last Admin   pembrolizumab (KEYTRUDA) 200 mg in sodium chloride 0.9 % 50 mL chemo infusion  200 mg Intravenous Once [name], MD 116 mL/hr at 08/08/22 1341 200 mg at 08/08/22 1341    SUMMARY OF ONCOLOGIC HISTORY: Oncology History Overview Note  Patient followed due to HX pulmonary nodules noted 2011  Primary cancer of left upper lobe of lung (HCC)   Staging form: Lung, AJCC 7th Edition   - Clinical stage from 09/27/2015: Stage IA (T1b, N0, M0) - Signed by Si Gaul, MD on 09/27/2015  PDL-1 90%    Primary cancer of left upper lobe of lung (HCC)  07/12/2015 Imaging   CT Chest IMPRESSION:  The solid component of the large part solid nodule in the left upper lobe measures 9 x 3 mm (mean diameter 6 mm.) persistent part solid nodules with solid components greater than or equal to 6 mm should be considered highly suspicious for pulmonary adenocarcinoma   08/01/2015  Initial Diagnosis   Primary cancer of left upper lobe of lung (HCC)   08/01/2015 Surgery   PROCEDURE:  Left video-assisted thoracoscopy,           Wedge resection,           Thoracoscopic lingular sparing left upper lobectomy           Lymph node dissection           On-Q local anesthetic catheter placement   08/01/2015 Pathology Results   Lung, resection (segmental or lobe), Left Upper Lobe - ADENOCARCINOMA, WELL DIFFERENTIATED, SPANNING 2.5 CM. - THE SURGICAL RESECTION MARGINS ARE NEGATIVE FOR CARCINOMA. 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 She underwent a total abdominal hysterectomy and bilateral salpingo-oophorectomy, pelvic and para-aortic lymphadenectomy. She received postoperative whole pelvis radiation therapy.    02/06/2015 Surgery   In December 2016 the patient was found to have a new primary squamous cell carcinoma the vulva undergoing a modified radical vulvectomy on 02/06/2015. She underwent a modified radical vulvectomy at [facility] on 02/06/2015. She was found to have a 3 cm vulvar lesion of the posterior vulva extending into the posterior vagina. A rhomboid flap was required to close the incision.  Final pathology showed some close surgical margins. However, the specimen margins were toward the time of surgery and it was Dr Nelwyn Salisbury impression that they were widely around the primary cancer. She discussed options with the patient and they agreed to monitor her closely but not  recommend any radiation therapy to the vulva.   01/13/2017 Surgery   She experienced recurrent vulvar cancer October 2018 managed by a small modified radical vulvectomy of the right side on January 13, 2017.  Depth of invasion was 4 mm all margins were negative    03/13/2017 PET scan   1. No evidence of recurrent or metastatic disease. 2. Aortic atherosclerosis (ICD10-170.0). Coronary artery calcification.   07/20/2019 Miscellaneous   Examination of the vulva during routine surveillance on 07/20/19 showed a friable, verrucous lesion measuring approximately 2 cm was seen on the anterior wall of the suburethral vagina extending to the introitus and wrapping around to the right mid vagina. This was concerning for recurrence and was biopsied at that appointment. 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    08/11/2019 Miscellaneous   On 08/11/19 she underwent total vaginectomy. Intraoperative findings were significant for a forshortened 3cm vagina, friable 4cm plaque on the anterior vagina extending to the right fornix and left posterior vaginal wall. This necessitated a total vaginectomy to resect the lesion grossly. Her tissues were extremely friable due to age, prior radiation, menopause and lichen sclerosis. There was unavoidable fragmentation of the tissues during the resection. All gross visible disease was removed. Surgery was uncomplicated, and she reported no postop pain.  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 The suspicious foci are present at the edges of tissue fragments.  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        09/13/2019 PET scan   1. No specific evidence of recurrent vulvar carcinoma or distant metastases. There is nonspecific activity in the region of  the vaginal introitus which could reflect urine contamination or vaginal recurrence. Correlation with physical examination recommended.  2. No abnormal thoracic activity status post left upper lobe wedge resection. 3. Coronary and Aortic Atherosclerosis (ICD10-I70.0).   09/29/2019 - 10/12/2019 Radiation Therapy   The patient received adjuvant vaginal brachytherapy between 09/29/2019 through 10/12/2019.     02/28/2021 PET scan   1. Interval decrease in degree of FDG uptake associated with the vaginal introitus. The SUV max on today's study is equal to 5.96 compared with 9.17 previously. 2. No highly concerning features to suggest nodal metastasis or distant metastatic disease. Mildly increased tracer uptake within and morphologically benign left inguinal lymph node is identified which  warrants attention on follow-up imaging. 3. Stable postoperative changes within the left upper lobe without signs to suggest locally recurrent tumor. 4. Aortic Atherosclerosis (ICD10-I70.0). Coronary artery calcifications.   03/11/2021 Initial Diagnosis   Recurrent vaginal cancer   03/20/2021 Procedure   03/20/21: Vaginectomy, CO2 laser of the vulva for recurrent vaginal cancer, high-grade vulvar dysplasia.  Vaginal excision showed squamous mucosa with ulceration and inflammation.  No malignancy or dysplasia identified.  Rare atypical "radiation" fibroblast embedded.    10/24/2021 Imaging   1. No acute abnormality of the abdomen or pelvis. 2. Aortic Atherosclerosis (ICD10-I70.0).   02/19/2022 Surgery   Preop Diagnosis: history of vulvar and vaginal cancer, suspicion for recurrent vulvar cancer   Postoperative Diagnosis: same as above   Surgery: Partial simple right/posterior vulvectomy with rotational rhomboid flap    Surgeons: Carin Hock MD    Pathology: posterior vulva (6-8 o'c) with stitch at 12, additional superior, lateral and inferior margins, deep margin   Operative findings: 2 x 2.5 cm verrucous  appearing lesion along the perineum and extending to the introitus, relatively broad 2 cm base. Given size of defect and its location, required rotational flap to close defect. Hyperkeratotic areas within the significantly foreshortened vagina.   03/13/2022 PET scan    ADDENDUM REPORT: 03/14/2022 08:44   ADDENDUM: I just discussed this study with Dr. Pricilla Holm. The patient had surgery about 3 weeks ago for recurrent disease at the vaginal introitus with no residual disease apparent clinically. Additionally, the patient has had no chemotherapy in the 1 year interval since the prior study and no radiation therapy to the left groin region. In light of this additional history, the changes in the region the vaginal introitus today may well simply represent urinary contaminant rather than residual disease. Further, given lack of interval systemic chemotherapy and no interval focused therapy to the left groin lymph node, the interval stability in this node (9 mm previously versus 10 mm today) and stable low level FDG accumulation ( SUV max = 2.9 today compared to 3.0 previously) would suggest a reactive etiology as more likely, especially in light of the recurrent surgeries to the external genitalia.    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  Approximately 1 x 1 cm raised mass along the right distal vagina from 6-8 o'clock.  Second raised mass along the left vaginal sidewall extending almost up to the vaginal apex measuring approximately 1 x 1.5 cm.  Several  small, less than 5 mm, masses along the vaginal apex and superior wall of the posterior vagina.  Well-healed lumbar incision from recent surgery.  Significant loss of architecture of the vulva with erythema noted of bilateral anterior labia and along the perineum.  Multiple biopsies taken including 2 and 10:00 along the vulva and at 5:00 along the posterior vulva.     05/07/2022 Pathology Results   CASE: [id number] [name] Surgical Pathology Report  Clinical History: Recurrent vulvar cancer (crm)  FINAL MICROSCOPIC DIAGNOSIS:  A. VAGINAL LESION, DISTAL RIGHT, EXCISION:      Invasive squamous cell carcinoma.  Cauterized carcinoma present at the mucosal margin. Deep margin are negative for carcinoma.  B. VAGINAL SIDEWALL, LEFT, EXCISION:      Invasive squamous cell carcinoma.      Cauterized carcinoma present at the mucosal margin. Deep margin are negative for carcinoma.  C. VAGINA, APEX, EXCISION:      At least squamous cell carcinoma in situ.  D. VAGINA, POSTERIOR, BIOPSY:      At least squamous cell carcinoma in situ.  E. VULVA, RIGHT, BIOPSY:      Squamous mucosa with acute inflammation and reactive epithelial changes.      Negative for dysplasia or malignancy.  F. VULVA, LEFT, BIOPSY:      Squamous mucosa with acute and chronic inflammation and reactive epithelial changes.      Negative for dysplasia or malignancy.  G. VULVA, POSTERIOR, 5:00, BIOPSY:      Squamous mucosa with acute and chronic inflammation and reactive epithelial changes.      Negative for dysplasia or malignancy.     06/03/2022 Cancer Staging   Staging form: Vagina, AJCC 8th Edition - Clinical: cT1, cN0, cM0 - Signed by [name], MD on 06/03/2022   06/09/2022 - 06/09/2022 Chemotherapy   Patient is on Treatment Plan : UTERINE Pembrolizumab (200) q21d     06/09/2022 - 06/09/2022 Chemotherapy   Patient is on Treatment Plan : UTERINE Carboplatin (AUC 5) q21d     06/30/2022 -   Chemotherapy   Patient is on Treatment Plan : UTERINE Pembrolizumab (200) q21d       PHYSICAL EXAMINATION: ECOG PERFORMANCE STATUS: 0 - Asymptomatic  Vitals:   08/08/22 1253  BP: (!) 126/57  Pulse: 63  Resp: 18  Temp: 98.1 F (36.7 C)  SpO2: 99%   Filed Weights   08/08/22 1253  Weight: 158 lb 9.6 oz (71.9 kg)    GENERAL:alert, no distress and comfortable NEURO: alert & oriented x 3 with fluent speech, no focal motor/sensory deficits  LABORATORY DATA:  I have reviewed the data as listed    Component Value Date/Time   NA 131 (L) 07/18/2022 1258   NA 136 09/18/2016 1536   K 3.8 07/18/2022 1258   K 3.9 09/18/2016 1536   CL 95 (L) 07/18/2022 1258   CO2 29 07/18/2022 1258   CO2 26 09/18/2016 1536   GLUCOSE 96 07/18/2022 1258   GLUCOSE 172 (H) 09/18/2016 1536   BUN 19 07/18/2022 1258   BUN 15.8 09/18/2016 1536   CREATININE 0.67 07/18/2022 1258   CREATININE 0.8 09/18/2016 1536   CALCIUM 9.6 07/18/2022 1258   CALCIUM 9.3 09/18/2016 1536   PROT 7.1 07/18/2022 1258   PROT 6.3 (L) 09/18/2016 1536   ALBUMIN 4.5 07/18/2022 1258   ALBUMIN 3.6 09/18/2016 1536   AST 14 (L) 07/18/2022 1258   AST 18 09/18/2016 1536   ALT 14 07/18/2022 1258   ALT 17 09/18/2016 1536   ALKPHOS 90 07/18/2022 1258   ALKPHOS 91 09/18/2016 1536   BILITOT 0.4 07/18/2022 1258   BILITOT 0.42 09/18/2016 1536   GFRNONAA >60 07/18/2022 1258   GFRAA >60 08/04/2019 0901   GFRAA >60 09/13/2018 0808    No results found for: "SPEP", "UPEP"  Lab Results  Component Value Date   WBC 5.9 07/18/2022   NEUTROABS 4.0 07/18/2022   HGB 11.9 (L) 07/18/2022   HCT 37.6 07/18/2022   MCV 68.2 (L) 07/18/2022   PLT 285 07/18/2022      Chemistry      Component Value Date/Time  NA 131 (L) 07/18/2022 1258   NA 136 09/18/2016 1536   K 3.8 07/18/2022 1258   K 3.9 09/18/2016 1536   CL 95 (L) 07/18/2022 1258   CO2 29 07/18/2022 1258   CO2 26 09/18/2016 1536   BUN 19 07/18/2022 1258   BUN 15.8 09/18/2016 1536    CREATININE 0.67 07/18/2022 1258   CREATININE 0.8 09/18/2016 1536      Component Value Date/Time   CALCIUM 9.6 07/18/2022 1258   CALCIUM 9.3 09/18/2016 1536   ALKPHOS 90 07/18/2022 1258   ALKPHOS 91 09/18/2016 1536   AST 14 (L) 07/18/2022 1258   AST 18 09/18/2016 1536   ALT 14 07/18/2022 1258   ALT 17 09/18/2016 1536   BILITOT 0.4 07/18/2022 1258   BILITOT 0.42 09/18/2016 1536

## 2022-08-08 NOTE — Assessment & Plan Note (Signed)
She tolerated recent treatment better She is comfortable to proceed with treatment as scheduled We will proceed with treatment for minimum of 3-4 cycles before repeating imaging studies

## 2022-08-08 NOTE — Patient Instructions (Signed)
[facility]  Discharge Instructions: Thank you for choosing [facility] to provide your oncology and hematology care.   If you have a lab appointment with the Cancer Center, please go directly to the Cancer Center and check in at the registration area.   Wear comfortable clothing and clothing appropriate for easy access to any Portacath or PICC line.   We strive to give you quality time with your provider. You may need to reschedule your appointment if you arrive late (15 or more minutes).  Arriving late affects you and other patients whose appointments are after yours.  Also, if you miss three or more appointments without notifying the office, you may be dismissed from the clinic at the provider's discretion.      For prescription refill requests, have your pharmacy contact our office and allow 72 hours for refills to be completed.    Today you received the following chemotherapy and/or immunotherapy agents: Keytruda      To help prevent nausea and vomiting after your treatment, we encourage you to take your nausea medication as directed.  BELOW ARE SYMPTOMS THAT SHOULD BE REPORTED IMMEDIATELY: *FEVER GREATER THAN 100.4 F (38 C) OR HIGHER *CHILLS OR SWEATING *NAUSEA AND VOMITING THAT IS NOT CONTROLLED WITH YOUR NAUSEA MEDICATION *UNUSUAL SHORTNESS OF BREATH *UNUSUAL BRUISING OR BLEEDING *URINARY PROBLEMS (pain or burning when urinating, or frequent urination) *BOWEL PROBLEMS (unusual diarrhea, constipation, pain near the anus) TENDERNESS IN MOUTH AND THROAT WITH OR WITHOUT PRESENCE OF ULCERS (sore throat, sores in mouth, or a toothache) UNUSUAL RASH, SWELLING OR PAIN  UNUSUAL VAGINAL DISCHARGE OR ITCHING   Items with * indicate a potential emergency and should be followed up as soon as possible or go to the Emergency Department if any problems should occur.  Please show the CHEMOTHERAPY ALERT CARD or IMMUNOTHERAPY ALERT CARD at  check-in to the Emergency Department and triage nurse.  Should you have questions after your visit or need to cancel or reschedule your appointment, please contact [facility]  Dept: [phone]  and follow the prompts.  Office hours are 8:00 a.m. to 4:30 p.m. Monday - Friday. Please note that voicemails left after 4:00 p.m. may not be returned until the following business day.  We are closed weekends and major holidays. You have access to a nurse at all times for urgent questions. Please call the main number to the clinic Dept: [phone] and follow the prompts.   For any non-urgent questions, you may also contact your provider using MyChart. We now offer e-Visits for anyone 18 and older to request care online for non-urgent symptoms. For details visit [email or web address].   Also download the MyChart app! Go to the app store, search "MyChart", open the app, select [facility], and log in with your MyChart username and password.   

## 2022-08-18 DIAGNOSIS — N3090 Cystitis, unspecified without hematuria: Secondary | ICD-10-CM | POA: Diagnosis not present

## 2022-08-18 DIAGNOSIS — R300 Dysuria: Secondary | ICD-10-CM | POA: Diagnosis not present

## 2022-08-19 DIAGNOSIS — M4805 Spinal stenosis, thoracolumbar region: Secondary | ICD-10-CM | POA: Diagnosis not present

## 2022-08-26 DIAGNOSIS — M4805 Spinal stenosis, thoracolumbar region: Secondary | ICD-10-CM | POA: Diagnosis not present

## 2022-08-29 ENCOUNTER — Encounter: Payer: Self-pay | Admitting: Hematology and Oncology

## 2022-08-29 ENCOUNTER — Inpatient Hospital Stay: Payer: Medicare Other

## 2022-08-29 ENCOUNTER — Other Ambulatory Visit: Payer: Self-pay

## 2022-08-29 ENCOUNTER — Inpatient Hospital Stay: Payer: Medicare Other | Attending: Gynecologic Oncology | Admitting: Hematology and Oncology

## 2022-08-29 VITALS — BP 150/60 | HR 72 | Temp 98.0°F | Resp 18 | Ht 65.0 in | Wt 157.0 lb

## 2022-08-29 DIAGNOSIS — L900 Lichen sclerosus et atrophicus: Secondary | ICD-10-CM | POA: Insufficient documentation

## 2022-08-29 DIAGNOSIS — Z90710 Acquired absence of both cervix and uterus: Secondary | ICD-10-CM | POA: Diagnosis not present

## 2022-08-29 DIAGNOSIS — C52 Malignant neoplasm of vagina: Secondary | ICD-10-CM

## 2022-08-29 DIAGNOSIS — Z8544 Personal history of malignant neoplasm of other female genital organs: Secondary | ICD-10-CM | POA: Insufficient documentation

## 2022-08-29 DIAGNOSIS — E876 Hypokalemia: Secondary | ICD-10-CM | POA: Diagnosis not present

## 2022-08-29 DIAGNOSIS — Z9079 Acquired absence of other genital organ(s): Secondary | ICD-10-CM | POA: Diagnosis not present

## 2022-08-29 DIAGNOSIS — Z8542 Personal history of malignant neoplasm of other parts of uterus: Secondary | ICD-10-CM | POA: Insufficient documentation

## 2022-08-29 DIAGNOSIS — C3412 Malignant neoplasm of upper lobe, left bronchus or lung: Secondary | ICD-10-CM | POA: Insufficient documentation

## 2022-08-29 DIAGNOSIS — Z90722 Acquired absence of ovaries, bilateral: Secondary | ICD-10-CM | POA: Diagnosis not present

## 2022-08-29 DIAGNOSIS — Z5112 Encounter for antineoplastic immunotherapy: Secondary | ICD-10-CM | POA: Diagnosis not present

## 2022-08-29 DIAGNOSIS — Z902 Acquired absence of lung [part of]: Secondary | ICD-10-CM | POA: Diagnosis not present

## 2022-08-29 DIAGNOSIS — Z8541 Personal history of malignant neoplasm of cervix uteri: Secondary | ICD-10-CM | POA: Insufficient documentation

## 2022-08-29 LAB — CMP (CANCER CENTER ONLY)
ALT: 16 U/L (ref 0–44)
AST: 17 U/L (ref 15–41)
Albumin: 4.3 g/dL (ref 3.5–5.0)
Alkaline Phosphatase: 96 U/L (ref 38–126)
Anion gap: 10 (ref 5–15)
BUN: 17 mg/dL (ref 8–23)
CO2: 26 mmol/L (ref 22–32)
Calcium: 9.4 mg/dL (ref 8.9–10.3)
Chloride: 96 mmol/L — ABNORMAL LOW (ref 98–111)
Creatinine: 0.71 mg/dL (ref 0.44–1.00)
GFR, Estimated: >60 mL/min (ref >60)
Glucose, Bld: 168 mg/dL — ABNORMAL HIGH (ref 70–99)
Potassium: 3.3 mmol/L — ABNORMAL LOW (ref 3.5–5.1)
Sodium: 132 mmol/L — ABNORMAL LOW (ref 135–145)
Total Bilirubin: 0.5 mg/dL (ref 0.3–1.2)
Total Protein: 6.8 g/dL (ref 6.5–8.1)

## 2022-08-29 LAB — CBC WITH DIFFERENTIAL (CANCER CENTER ONLY)
Abs Immature Granulocytes: 0.01 10*3/uL (ref 0.00–0.07)
Basophils Absolute: 0 10*3/uL (ref 0.0–0.1)
Basophils Relative: 1 %
Eosinophils Absolute: 0.1 10*3/uL (ref 0.0–0.5)
Eosinophils Relative: 1 %
HCT: 37.2 % (ref 36.0–46.0)
Hemoglobin: 12.3 g/dL (ref 12.0–15.0)
Immature Granulocytes: 0 %
Lymphocytes Relative: 22 %
Lymphs Abs: 1 10*3/uL (ref 0.7–4.0)
MCH: 22 pg — ABNORMAL LOW (ref 26.0–34.0)
MCHC: 33.1 g/dL (ref 30.0–36.0)
MCV: 66.4 fL — ABNORMAL LOW (ref 80.0–100.0)
Monocytes Absolute: 0.4 10*3/uL (ref 0.1–1.0)
Monocytes Relative: 8 %
Neutro Abs: 3.2 10*3/uL (ref 1.7–7.7)
Neutrophils Relative %: 68 %
Platelet Count: 242 10*3/uL (ref 150–400)
RBC: 5.6 MIL/uL — ABNORMAL HIGH (ref 3.87–5.11)
RDW: 15.4 % (ref 11.5–15.5)
WBC Count: 4.7 10*3/uL (ref 4.0–10.5)
nRBC: 0 % (ref 0.0–0.2)

## 2022-08-29 MED ORDER — SODIUM CHLORIDE 0.9 % IV SOLN: Freq: Once | INTRAVENOUS | Status: AC

## 2022-08-29 MED ORDER — SODIUM CHLORIDE 0.9 % IV SOLN
200.0000 mg | Freq: Once | INTRAVENOUS | Status: AC
  Administered 2022-08-29: 200 mg via INTRAVENOUS
  Filled 2022-08-29: qty 200

## 2022-08-29 NOTE — Patient Instructions (Signed)
[facility]  Discharge Instructions: Thank you for choosing [facility] to provide your oncology and hematology care.   If you have a lab appointment with the Cancer Center, please go directly to the Cancer Center and check in at the registration area.   Wear comfortable clothing and clothing appropriate for easy access to any Portacath or PICC line.   We strive to give you quality time with your provider. You may need to reschedule your appointment if you arrive late (15 or more minutes).  Arriving late affects you and other patients whose appointments are after yours.  Also, if you miss three or more appointments without notifying the office, you may be dismissed from the clinic at the provider's discretion.      For prescription refill requests, have your pharmacy contact our office and allow 72 hours for refills to be completed.    Today you received the following chemotherapy and/or immunotherapy agents: pembrolizumab      To help prevent nausea and vomiting after your treatment, we encourage you to take your nausea medication as directed.  BELOW ARE SYMPTOMS THAT SHOULD BE REPORTED IMMEDIATELY: *FEVER GREATER THAN 100.4 F (38 C) OR HIGHER *CHILLS OR SWEATING *NAUSEA AND VOMITING THAT IS NOT CONTROLLED WITH YOUR NAUSEA MEDICATION *UNUSUAL SHORTNESS OF BREATH *UNUSUAL BRUISING OR BLEEDING *URINARY PROBLEMS (pain or burning when urinating, or frequent urination) *BOWEL PROBLEMS (unusual diarrhea, constipation, pain near the anus) TENDERNESS IN MOUTH AND THROAT WITH OR WITHOUT PRESENCE OF ULCERS (sore throat, sores in mouth, or a toothache) UNUSUAL RASH, SWELLING OR PAIN  UNUSUAL VAGINAL DISCHARGE OR ITCHING   Items with * indicate a potential emergency and should be followed up as soon as possible or go to the Emergency Department if any problems should occur.  Please show the CHEMOTHERAPY ALERT CARD or IMMUNOTHERAPY ALERT CARD at  check-in to the Emergency Department and triage nurse.  Should you have questions after your visit or need to cancel or reschedule your appointment, please contact [facility]  Dept: [phone]  and follow the prompts.  Office hours are 8:00 a.m. to 4:30 p.m. Monday - Friday. Please note that voicemails left after 4:00 p.m. may not be returned until the following business day.  We are closed weekends and major holidays. You have access to a nurse at all times for urgent questions. Please call the main number to the clinic Dept: [phone] and follow the prompts.   For any non-urgent questions, you may also contact your provider using MyChart. We now offer e-Visits for anyone 18 and older to request care online for non-urgent symptoms. For details visit [email or web address].   Also download the MyChart app! Go to the app store, search "MyChart", open the app, select , and log in with your MyChart username and password.   

## 2022-08-29 NOTE — Assessment & Plan Note (Signed)
She tolerated recent treatment better She is comfortable to proceed with treatment as scheduled I plan to repeat imaging before her next appt

## 2022-08-29 NOTE — Progress Notes (Signed)
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[facility]) She tolerated recent treatment better She is comfortable to proceed with treatment as scheduled I plan to repeat imaging before her next appt  Hypokalemia Likely due to her BP meds She has no symptoms Observe only  Orders Placed This Encounter  Procedures   NM PET Image Restage (PS) Skull Base to Thigh (F-18 FDG)    Standing Status:   Future    Standing Expiration Date:   08/29/2023    Order Specific Question:   If indicated for the ordered procedure, I authorize the administration of a radiopharmaceutical per Radiology protocol    Answer:   Yes    Order Specific Question:   Preferred imaging location?    Answer:   [facility]    Order Specific Question:   Radiology Contrast Protocol - do NOT remove file path    Answer:   [email or web address]    All questions were answered. The patient knows to call the clinic with any problems, questions or concerns. The total time spent in the appointment was 30 minutes encounter with patients including review of chart and various tests results, discussions about plan of care and coordination of care plan   [name], MD 08/29/2022 1:40 PM  INTERVAL HISTORY: Please see below for problem oriented charting. she returns for treatment follow-up on [place] She has no side-effects of treatment We discussed timing of next imaging  REVIEW OF SYSTEMS:   Constitutional: Denies fevers, chills or abnormal weight loss Eyes: Denies blurriness of vision Ears, nose, mouth, throat, and face: Denies mucositis or sore throat Respiratory: Denies cough, dyspnea or wheezes Cardiovascular: Denies palpitation, chest  discomfort or lower extremity swelling Gastrointestinal:  Denies nausea, heartburn or change in bowel habits Skin: Denies abnormal skin rashes Lymphatics: Denies new lymphadenopathy or easy bruising Neurological:Denies numbness, tingling or new weaknesses Behavioral/Psych: Mood is stable, no new changes  All other systems were reviewed with the patient and are negative.  I have reviewed the past medical history, past surgical history, social history and family history with the patient and they are unchanged from previous note.  ALLERGIES:  is allergic to penicillins, fentanyl, codeine, compazine [prochlorperazine], percocet [oxycodone-acetaminophen], and trazodone hcl.  MEDICATIONS:  Current Outpatient Medications  Medication Sig Dispense Refill   acetaminophen (TYLENOL) 500 MG tablet Take 500-1,000 mg by mouth every 6 (six) hours as needed (pain.).     acyclovir (ZOVIRAX) 800 MG tablet Take 800 mg by mouth 2 (two) times daily as needed (cold sores).     albuterol (VENTOLIN HFA) 108 (90 Base) MCG/ACT inhaler Inhale 2 puffs into the lungs every 6 (six) hours as needed for wheezing or shortness of breath.     amLODipine (NORVASC) 5 MG tablet Take 5 mg by mouth every evening.     Ascorbic Acid (VITAMIN C PO) Take 500 mg by mouth in the morning.     benazepril-hydrochlorthiazide (LOTENSIN HCT) 20-25 MG tablet Take 1 tablet by mouth in the morning.     benzonatate (TESSALON) 200 MG capsule Take 200 mg by mouth 2 (two) times daily as needed for cough.     BIOTIN PO Take 1 tablet by mouth in the morning.     Cholecalciferol (VITAMIN D3 PO) Take 2,000 Units by mouth in  the morning and at bedtime.     docusate sodium (COLACE) 100 MG capsule Take 100 mg by mouth 2 (two) times daily.     doxylamine, Sleep, (UNISOM) 25 MG tablet Take 12.5 mg by mouth at bedtime.     estradiol (ESTRACE VAGINAL) 0.1 MG/GM vaginal cream Plan to beginning to use vaginal estrogen cream nightly at bedtime for 2 weeks then  three times a week after to assist with healing in the vagina. You will need to use a fingertip size amount of cream on your finger and place slightly past the vaginal entrance at bedtime. Do not use the applicator if this comes with the cream. 42.5 g 3   fexofenadine (ALLEGRA) 180 MG tablet Take 180 mg by mouth daily.     ibuprofen (ADVIL) 200 MG tablet Take 200-600 mg by mouth every 8 (eight) hours as needed (pain.).     levothyroxine (SYNTHROID, LEVOTHROID) 75 MCG tablet Take 75 mcg by mouth daily before breakfast.     MAGNESIUM PO Take 1 tablet by mouth every evening.     Multiple Vitamin (MULTIVITAMIN WITH MINERALS) TABS tablet Take 1 tablet by mouth in the morning.     Omega-3 Fatty Acids (FISH OIL PO) Take 1 capsule by mouth in the morning.     Polyethyl Glycol-Propyl Glycol (SYSTANE ULTRA) 0.4-0.3 % SOLN Place 1-2 drops into both eyes 3 (three) times daily as needed (dry/irritated eyes.).     polyethylene glycol (MIRALAX / GLYCOLAX) packet Take 11.3333 g by mouth at bedtime. 2/3 capful     simvastatin (ZOCOR) 20 MG tablet Take 20 mg by mouth at bedtime.      tacrolimus (PROTOPIC) 0.1 % ointment Apply topically 2 (two) times daily for 6 weeks. 30 g 1   TURMERIC PO Take 1 capsule by mouth every evening.     No current facility-administered medications for this visit.    SUMMARY OF ONCOLOGIC HISTORY: Oncology History Overview Note  Patient followed due to HX pulmonary nodules noted 2011  Primary cancer of left upper lobe of lung (HCC)   Staging form: Lung, AJCC 7th Edition   - Clinical stage from 09/27/2015: Stage IA (T1b, N0, M0) - Signed by Si Gaul, MD on 09/27/2015  PDL-1 90%    Primary cancer of left upper lobe of lung (HCC)  07/12/2015 Imaging   CT Chest IMPRESSION:  The solid component of the large part solid nodule in the left upper lobe measures 9 x 3 mm (mean diameter 6 mm.) persistent part solid nodules with solid components greater than or equal to 6 mm should be  considered highly suspicious for pulmonary adenocarcinoma   08/01/2015 Initial Diagnosis   Primary cancer of left upper lobe of lung (HCC)   08/01/2015 Surgery   PROCEDURE:  Left video-assisted thoracoscopy,           Wedge resection,           Thoracoscopic lingular sparing left upper lobectomy           Lymph node dissection           On-Q local anesthetic catheter placement   08/01/2015 Pathology Results   Lung, resection (segmental or lobe), Left Upper Lobe - ADENOCARCINOMA, WELL DIFFERENTIATED, SPANNING 2.5 CM. - THE SURGICAL RESECTION MARGINS ARE NEGATIVE FOR CARCINOMA. 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 She underwent a total abdominal hysterectomy and bilateral salpingo-oophorectomy, pelvic and para-aortic lymphadenectomy. She received postoperative whole pelvis radiation therapy.    02/06/2015 Surgery   In December 2016 the patient was found to have a new primary squamous cell carcinoma the vulva undergoing a modified radical vulvectomy on 02/06/2015. She underwent a modified radical vulvectomy at [facility] on 02/06/2015. She was found to have a 3 cm vulvar lesion of the posterior vulva extending into the posterior vagina. A rhomboid flap was required to close the incision.  Final pathology showed some close surgical margins. However, the specimen margins were toward the time of surgery and it was Dr Nelwyn Salisbury impression that they were widely around the primary cancer. She discussed  options with the patient and they agreed to monitor her closely but not recommend any radiation therapy to the vulva.   01/13/2017 Surgery   She experienced recurrent vulvar cancer October 2018 managed by a small modified radical vulvectomy of the right side on January 13, 2017.  Depth of invasion was 4 mm all margins were negative    03/13/2017 PET scan   1. No evidence of recurrent or metastatic disease. 2. Aortic atherosclerosis (ICD10-170.0). Coronary artery calcification.   07/20/2019 Miscellaneous   Examination of the vulva during routine surveillance on 07/20/19 showed a friable, verrucous lesion measuring approximately 2 cm was seen on the anterior wall of the suburethral vagina extending to the introitus and wrapping around to the right mid vagina. This was concerning for recurrence and was biopsied at that appointment. 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    08/11/2019 Miscellaneous   On 08/11/19 she underwent total vaginectomy. Intraoperative findings were significant for a forshortened 3cm vagina, friable 4cm plaque on the anterior vagina extending to the right fornix and left posterior vaginal wall. This necessitated a total vaginectomy to resect the lesion grossly. Her tissues were extremely friable due to age, prior radiation, menopause and lichen sclerosis. There was unavoidable fragmentation of the tissues during the resection. All gross visible disease was removed. Surgery was uncomplicated, and she reported no postop pain.  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 The suspicious foci are present at the edges of tissue fragments.  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        09/13/2019 PET scan   1. No specific evidence of recurrent vulvar carcinoma  or distant metastases. There is nonspecific activity in the region of the vaginal introitus which could reflect urine contamination or vaginal recurrence. Correlation with physical examination recommended.  2. No abnormal thoracic activity status post left upper lobe wedge resection. 3. Coronary and Aortic Atherosclerosis (ICD10-I70.0).   09/29/2019 - 10/12/2019 Radiation Therapy   The patient received adjuvant vaginal brachytherapy between 09/29/2019 through 10/12/2019.     02/28/2021 PET scan   1. Interval decrease in degree of FDG uptake associated with the vaginal introitus. The SUV max on today's study is equal to 5.96 compared with 9.17 previously. 2. No highly concerning features to  suggest nodal metastasis or distant metastatic disease. Mildly increased tracer uptake within and morphologically benign left inguinal lymph node is identified which warrants attention on follow-up imaging. 3. Stable postoperative changes within the left upper lobe without signs to suggest locally recurrent tumor. 4. Aortic Atherosclerosis (ICD10-I70.0). Coronary artery calcifications.   03/11/2021 Initial Diagnosis   Recurrent vaginal cancer   03/20/2021 Procedure   03/20/21: Vaginectomy, CO2 laser of the vulva for recurrent vaginal cancer, high-grade vulvar dysplasia.  Vaginal excision showed squamous mucosa with ulceration and inflammation.  No malignancy or dysplasia identified.  Rare atypical "radiation" fibroblast embedded.    10/24/2021 Imaging   1. No acute abnormality of the abdomen or pelvis. 2. Aortic Atherosclerosis (ICD10-I70.0).   02/19/2022 Surgery   Preop Diagnosis: history of vulvar and vaginal cancer, suspicion for recurrent vulvar cancer   Postoperative Diagnosis: same as above   Surgery: Partial simple right/posterior vulvectomy with rotational rhomboid flap    Surgeons: Carin Hock MD    Pathology: posterior vulva (6-8 o'c) with stitch at 12, additional superior, lateral and inferior  margins, deep margin   Operative findings: 2 x 2.5 cm verrucous appearing lesion along the perineum and extending to the introitus, relatively broad 2 cm base. Given size of defect and its location, required rotational flap to close defect. Hyperkeratotic areas within the significantly foreshortened vagina.   03/13/2022 PET scan    ADDENDUM REPORT: 03/14/2022 08:44   ADDENDUM: I just discussed this study with Dr. Pricilla Holm. The patient had surgery about 3 weeks ago for recurrent disease at the vaginal introitus with no residual disease apparent clinically. Additionally, the patient has had no chemotherapy in the 1 year interval since the prior study and no radiation therapy to the left groin region. In light of this additional history, the changes in the region the vaginal introitus today may well simply represent urinary contaminant rather than residual disease. Further, given lack of interval systemic chemotherapy and no interval focused therapy to the left groin lymph node, the interval stability in this node (9 mm previously versus 10 mm today) and stable low level FDG accumulation ( SUV max = 2.9 today compared to 3.0 previously) would suggest a reactive etiology as more likely, especially in light of the recurrent surgeries to the external genitalia.     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  Approximately 1 x 1 cm raised mass along the right distal vagina from 6-8 o'clock.  Second raised mass along the left vaginal sidewall extending almost  up to the vaginal apex measuring approximately 1 x 1.5 cm.  Several small, less than 5 mm, masses along the vaginal apex and superior wall of the posterior vagina.  Well-healed lumbar incision from recent surgery.  Significant loss of architecture of the vulva with erythema noted of bilateral anterior labia and along the perineum.  Multiple biopsies taken including 2 and 10:00 along the vulva and at 5:00 along the posterior vulva.     05/07/2022 Pathology Results   CASE: [id number] [name] Surgical Pathology Report  Clinical History: Recurrent vulvar cancer (crm)  FINAL MICROSCOPIC DIAGNOSIS:  A. VAGINAL LESION, DISTAL RIGHT, EXCISION:      Invasive squamous cell carcinoma.      Cauterized carcinoma present at the mucosal margin. Deep margin are negative for carcinoma.  B. VAGINAL SIDEWALL, LEFT, EXCISION:      Invasive squamous cell carcinoma.      Cauterized carcinoma present at the mucosal margin. Deep margin are negative for carcinoma.  C. VAGINA, APEX, EXCISION:      At least squamous cell carcinoma in situ.  D. VAGINA, POSTERIOR, BIOPSY:      At least squamous cell carcinoma in situ.  E. VULVA, RIGHT, BIOPSY:      Squamous mucosa with acute inflammation and reactive epithelial changes.      Negative for dysplasia or malignancy.  F. VULVA, LEFT, BIOPSY:      Squamous mucosa with acute and chronic inflammation and reactive epithelial changes.      Negative for dysplasia or malignancy.  G. VULVA, POSTERIOR, 5:00, BIOPSY:      Squamous mucosa with acute and chronic inflammation and reactive epithelial changes.      Negative for dysplasia or malignancy.     06/03/2022 Cancer Staging   Staging form: Vagina, AJCC 8th Edition - Clinical: cT1, cN0, cM0 - Signed by [name], MD on 06/03/2022   06/09/2022 - 06/09/2022 Chemotherapy   Patient is on Treatment Plan : UTERINE Pembrolizumab (200) q21d     06/09/2022 - 06/09/2022 Chemotherapy   Patient is on  Treatment Plan : UTERINE Carboplatin (AUC 5) q21d     06/30/2022 -  Chemotherapy   Patient is on Treatment Plan : UTERINE Pembrolizumab (200) q21d       PHYSICAL EXAMINATION: ECOG PERFORMANCE STATUS: 0 - Asymptomatic  Vitals:   08/29/22 1051  BP: (!) 150/60  Pulse: 72  Resp: 18  Temp: 98 F (36.7 C)  SpO2: 100%   Filed Weights   08/29/22 1051  Weight: 157 lb (71.2 kg)    GENERAL:alert, no distress and comfortable NEURO: alert & oriented x 3 with fluent speech, no focal motor/sensory deficits  LABORATORY DATA:  I have reviewed the data as listed    Component Value Date/Time   NA 132 (L) 08/29/2022 1032   NA 136 09/18/2016 1536   K 3.3 (L) 08/29/2022 1032   K 3.9 09/18/2016 1536   CL 96 (L) 08/29/2022 1032   CO2 26 08/29/2022 1032   CO2 26 09/18/2016 1536   GLUCOSE 168 (H) 08/29/2022 1032   GLUCOSE 172 (H) 09/18/2016 1536   BUN 17 08/29/2022 1032   BUN 15.8 09/18/2016 1536   CREATININE 0.71 08/29/2022 1032   CREATININE 0.8 09/18/2016 1536   CALCIUM 9.4 08/29/2022 1032   CALCIUM 9.3 09/18/2016 1536   PROT 6.8 08/29/2022 1032   PROT 6.3 (L) 09/18/2016 1536   ALBUMIN 4.3 08/29/2022 1032   ALBUMIN 3.6 09/18/2016 1536   AST 17 08/29/2022 1032   AST 18 09/18/2016 1536   ALT 16 08/29/2022 1032   ALT 17 09/18/2016 1536   ALKPHOS 96 08/29/2022 1032   ALKPHOS 91 09/18/2016 1536   BILITOT 0.5 08/29/2022 1032   BILITOT 0.42 09/18/2016 1536   GFRNONAA >60 08/29/2022 1032   GFRAA >60 08/04/2019 0901   GFRAA >60 09/13/2018 0808    No results found for: "SPEP", "UPEP"  Lab Results  Component Value Date   WBC 4.7 08/29/2022   NEUTROABS 3.2 08/29/2022   HGB 12.3 08/29/2022   HCT 37.2 08/29/2022   MCV  66.4 (L) 08/29/2022   PLT 242 08/29/2022      Chemistry      Component Value Date/Time   NA 132 (L) 08/29/2022 1032   NA 136 09/18/2016 1536   K 3.3 (L) 08/29/2022 1032   K 3.9 09/18/2016 1536   CL 96 (L) 08/29/2022 1032   CO2 26 08/29/2022 1032   CO2 26  09/18/2016 1536   BUN 17 08/29/2022 1032   BUN 15.8 09/18/2016 1536   CREATININE 0.71 08/29/2022 1032   CREATININE 0.8 09/18/2016 1536      Component Value Date/Time   CALCIUM 9.4 08/29/2022 1032   CALCIUM 9.3 09/18/2016 1536   ALKPHOS 96 08/29/2022 1032   ALKPHOS 91 09/18/2016 1536   AST 17 08/29/2022 1032   AST 18 09/18/2016 1536   ALT 16 08/29/2022 1032   ALT 17 09/18/2016 1536   BILITOT 0.5 08/29/2022 1032   BILITOT 0.42 09/18/2016 1536

## 2022-08-29 NOTE — Assessment & Plan Note (Signed)
Likely due to her BP meds She has no symptoms Observe only

## 2022-09-01 ENCOUNTER — Other Ambulatory Visit: Payer: Self-pay

## 2022-09-02 DIAGNOSIS — M4805 Spinal stenosis, thoracolumbar region: Secondary | ICD-10-CM | POA: Diagnosis not present

## 2022-09-04 ENCOUNTER — Encounter: Payer: Self-pay | Admitting: Gynecologic Oncology

## 2022-09-04 ENCOUNTER — Telehealth: Payer: Self-pay | Admitting: Hematology and Oncology

## 2022-09-04 ENCOUNTER — Inpatient Hospital Stay: Payer: Medicare Other | Admitting: Gynecologic Oncology

## 2022-09-04 VITALS — BP 152/62 | HR 66 | Temp 98.6°F | Resp 18 | Ht 65.0 in | Wt 156.5 lb

## 2022-09-04 DIAGNOSIS — L900 Lichen sclerosus et atrophicus: Secondary | ICD-10-CM | POA: Diagnosis not present

## 2022-09-04 DIAGNOSIS — Z902 Acquired absence of lung [part of]: Secondary | ICD-10-CM | POA: Diagnosis not present

## 2022-09-04 DIAGNOSIS — C519 Malignant neoplasm of vulva, unspecified: Secondary | ICD-10-CM

## 2022-09-04 DIAGNOSIS — C52 Malignant neoplasm of vagina: Secondary | ICD-10-CM

## 2022-09-04 DIAGNOSIS — E876 Hypokalemia: Secondary | ICD-10-CM | POA: Diagnosis not present

## 2022-09-04 DIAGNOSIS — C3412 Malignant neoplasm of upper lobe, left bronchus or lung: Secondary | ICD-10-CM | POA: Diagnosis not present

## 2022-09-04 DIAGNOSIS — Z5112 Encounter for antineoplastic immunotherapy: Secondary | ICD-10-CM | POA: Diagnosis not present

## 2022-09-04 NOTE — Progress Notes (Signed)
Gynecologic Oncology Return Clinic Visit  09/04/22  Reason for Visit: follow-up  Treatment History: Oncology History Overview Note  Patient followed due to HX pulmonary nodules noted 2011  Primary cancer of left upper lobe of lung (HCC)   Staging form: Lung, AJCC 7th Edition   - Clinical stage from 09/27/2015: Stage IA (T1b, N0, M0) - Signed by Si Gaul, MD on 09/27/2015  PDL-1 90%    Primary cancer of left upper lobe of lung (HCC)  07/12/2015 Imaging   CT Chest IMPRESSION:  The solid component of the large part solid nodule in the left upper lobe measures 9 x 3 mm (mean diameter 6 mm.) persistent part solid nodules with solid components greater than or equal to 6 mm should be considered highly suspicious for pulmonary adenocarcinoma   08/01/2015 Initial Diagnosis   Primary cancer of left upper lobe of lung (HCC)   08/01/2015 Surgery   PROCEDURE:  Left video-assisted thoracoscopy,           Wedge resection,           Thoracoscopic lingular sparing left upper lobectomy           Lymph node dissection           On-Q local anesthetic catheter placement   08/01/2015 Pathology Results   Lung, resection (segmental or lobe), Left Upper Lobe - ADENOCARCINOMA, WELL DIFFERENTIATED, SPANNING 2.5 CM. - THE SURGICAL RESECTION MARGINS ARE NEGATIVE FOR CARCINOMA. 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 She underwent a total abdominal hysterectomy and bilateral salpingo-oophorectomy, pelvic and  para-aortic lymphadenectomy. She received postoperative whole pelvis radiation therapy.    02/06/2015 Surgery   In December 2016 the patient was found to have a new primary squamous cell carcinoma the vulva undergoing a modified radical vulvectomy on 02/06/2015. She underwent a modified radical vulvectomy at [facility] on 02/06/2015. She was found to have a 3 cm vulvar lesion of the posterior vulva extending into the posterior vagina. A rhomboid flap was required to close the incision.  Final pathology showed some close surgical margins. However, the specimen margins were toward the time of surgery and it was Dr Nelwyn Salisbury impression that they were widely around the primary cancer. She discussed options with the patient and they agreed to monitor her closely but not recommend any radiation therapy to the vulva.   01/13/2017 Surgery   She experienced recurrent vulvar cancer October 2018 managed by a small modified radical vulvectomy of the right side on January 13, 2017.  Depth of invasion was 4 mm all margins were negative    03/13/2017 PET scan   1. No evidence of recurrent or metastatic disease. 2. Aortic atherosclerosis (ICD10-170.0). Coronary artery calcification.   07/20/2019 Miscellaneous   Examination of the vulva during routine surveillance on 07/20/19 showed a friable, verrucous lesion measuring approximately 2 cm was seen on the anterior wall of the suburethral vagina extending to the introitus and wrapping around to the right mid vagina. This was concerning for recurrence and  was biopsied at that appointment. 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    08/11/2019 Miscellaneous   On 08/11/19 she underwent total vaginectomy. Intraoperative findings were significant for a forshortened 3cm vagina, friable 4cm plaque on the anterior vagina extending to the right fornix and left posterior vaginal wall. This necessitated a total vaginectomy to resect the lesion grossly. Her tissues were extremely  friable due to age, prior radiation, menopause and lichen sclerosis. There was unavoidable fragmentation of the tissues during the resection. All gross visible disease was removed. Surgery was uncomplicated, and she reported no postop pain.  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 The suspicious foci are present at the edges of tissue fragments.  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        09/13/2019 PET scan   1. No specific evidence of recurrent vulvar carcinoma or distant metastases. There is nonspecific activity in the region of the vaginal introitus which could reflect urine contamination or vaginal recurrence. Correlation with physical examination recommended.  2. No abnormal thoracic activity status post left upper lobe wedge resection. 3. Coronary and Aortic Atherosclerosis (ICD10-I70.0).   09/29/2019 - 10/12/2019 Radiation Therapy   The patient received adjuvant vaginal brachytherapy between 09/29/2019 through 10/12/2019.     02/28/2021 PET scan   1. Interval decrease in degree of FDG uptake associated with the vaginal introitus. The SUV max on today's study is equal to 5.96 compared with 9.17 previously. 2. No highly concerning features to suggest nodal metastasis or distant metastatic disease. Mildly increased tracer uptake within and morphologically benign left inguinal lymph node is identified which warrants attention on follow-up imaging. 3. Stable postoperative changes within the left upper lobe without signs to suggest locally recurrent tumor. 4. Aortic Atherosclerosis (ICD10-I70.0). Coronary artery calcifications.   03/11/2021 Initial Diagnosis   Recurrent vaginal cancer   03/20/2021 Procedure   03/20/21: Vaginectomy, CO2 laser of the  vulva for recurrent vaginal cancer, high-grade vulvar dysplasia.  Vaginal excision showed squamous mucosa with ulceration and inflammation.  No malignancy or dysplasia identified.  Rare atypical "radiation" fibroblast embedded.    10/24/2021 Imaging   1. No acute abnormality of the abdomen or pelvis. 2. Aortic Atherosclerosis (ICD10-I70.0).   02/19/2022 Surgery   Preop Diagnosis: history of vulvar and vaginal cancer, suspicion for recurrent vulvar cancer   Postoperative Diagnosis: same as above   Surgery: Partial simple right/posterior vulvectomy with rotational rhomboid flap    Surgeons: Carin Hock MD    Pathology: posterior vulva (6-8 o'c) with stitch at 12, additional superior, lateral and inferior margins, deep margin   Operative findings: 2 x 2.5 cm verrucous appearing lesion along the perineum and extending to the introitus, relatively broad 2 cm base. Given size of defect and its location, required rotational flap to close defect. Hyperkeratotic areas within the significantly foreshortened vagina.   03/13/2022 PET scan    ADDENDUM REPORT: 03/14/2022 08:44   ADDENDUM: I just discussed this study with Dr. Pricilla Holm. The patient had surgery about 3 weeks ago for recurrent disease at the vaginal introitus with no residual disease apparent clinically. Additionally, the patient has had no chemotherapy in the 1 year interval since the prior study and no radiation therapy to the left groin region. In light of this additional history, the changes in the region the vaginal introitus today may  well simply represent urinary contaminant rather than residual disease. Further, given lack of interval systemic chemotherapy and no interval focused therapy to the left groin lymph node, the interval stability in this node (9 mm previously versus 10 mm today) and stable low level FDG accumulation ( SUV max = 2.9 today compared to 3.0 previously) would suggest a reactive etiology as more likely,  especially in light of the recurrent surgeries to the external genitalia.     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  Approximately 1 x 1 cm raised mass along the right distal vagina from 6-8 o'clock.  Second raised mass along the left vaginal sidewall extending almost up to the vaginal apex measuring approximately 1 x 1.5 cm.  Several small, less than 5 mm, masses along the vaginal apex and superior wall of the posterior vagina.  Well-healed lumbar incision from recent surgery.  Significant loss of architecture of the vulva with erythema noted of bilateral anterior labia and along the perineum.  Multiple biopsies taken including 2 and 10:00 along the vulva and at 5:00 along the posterior vulva.     05/07/2022 Pathology Results   CASE: [id number] [name] Surgical Pathology Report  Clinical History: Recurrent vulvar cancer (crm)  FINAL MICROSCOPIC DIAGNOSIS:  A. VAGINAL LESION, DISTAL RIGHT, EXCISION:      Invasive squamous cell carcinoma.      Cauterized carcinoma present at the mucosal margin. Deep margin are negative for carcinoma.  B. VAGINAL SIDEWALL, LEFT, EXCISION:      Invasive squamous cell carcinoma.      Cauterized carcinoma present at the mucosal margin. Deep margin are negative for carcinoma.  C. VAGINA, APEX, EXCISION:      At least squamous cell carcinoma in situ.  D. VAGINA, POSTERIOR, BIOPSY:      At least squamous cell carcinoma in situ.  E. VULVA, RIGHT, BIOPSY:      Squamous mucosa with acute  inflammation and reactive epithelial changes.      Negative for dysplasia or malignancy.  F. VULVA, LEFT, BIOPSY:      Squamous mucosa with acute and chronic inflammation and reactive epithelial changes.      Negative for dysplasia or malignancy.  G. VULVA, POSTERIOR, 5:00, BIOPSY:      Squamous mucosa with acute and chronic inflammation and reactive epithelial changes.      Negative for dysplasia or malignancy.     06/03/2022 Cancer Staging   Staging form: Vagina, AJCC 8th Edition - Clinical: cT1, cN0, cM0 - Signed by [name], MD on 06/03/2022   06/09/2022 - 06/09/2022 Chemotherapy   Patient is on Treatment Plan : UTERINE Pembrolizumab (200) q21d     06/09/2022 - 06/09/2022 Chemotherapy   Patient is on Treatment Plan : UTERINE Carboplatin (AUC 5) q21d     06/30/2022 -  Chemotherapy   Patient is on Treatment Plan : UTERINE Pembrolizumab (200) q21d      C1 of carbo only on 06/09/22 - not tolerated Single agent Pembro started 06/30/22 C4 of pembro on 7/19  Interval History: Patient reports overall doing very  well.  Denies any side effects related to pembrolizumab other than symptoms of her Lichen planus (oral).  Has had several flares of her lichen sclerosus since starting the pembrolizumab, uses tacrolimus for these with resolution of symptoms typically within a few days.  Denies any bleeding or discharge.  Reports baseline bowel bladder function.  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 cath due to past injury   History of benign kidney tumor excluding renal pelvis 07/26/2009   s/p  right partial nephrectomy for angiomyolipoma   History of cervical cancer 03/1995   s/p   TAH /  BSO for poorly differentiated cervical  carcinoma.   History of endometrial cancer 03/1995   s/p  TAH/ BSO for cervical cancer w/ incidental finding endometrial cancer also ,  completed radiation 11/ 1997   History of external beam radiation therapy 1997   completed abominal radation for endometrial cancer 11/ 1997   History of radiation therapy 10/12/2019   [facility] HDR brachytherapy  09/29/2019-10/12/2019  Dr Antony Blackbird  (recurrent VAIN)   History of resection of meningioma 11/1968   T12   History of small bowel obstruction 12/2008   partial SBO admission 12/2008;  recurrent SBO in 02/2011;  02/ 2017;   so far no surgical intervention (per pt has a narrowed distal colon)   Hyperlipidemia    Hypertension    Hypothyroidism    followed by pcp   Lichen sclerosus of vulva    per pt w/ chronic itching   Nocturia    Pre-diabetes    Primary cancer of left upper lobe of lung (HCC) 08/01/2015   previously followed by oncologist--- dr Phill Myron--- released 09-13-2018  , pt non-smokerStage 1  Non-small cell ,  no chemo or radiation,  survillance   (bilateral nodules for noted in 2011)   Recurrent vaginal cancer (HCC) 08/2019   GYN oncologist--- dr Pricilla Holm---- total vaginectomy and completed radiation 09/ 2021;    laser ablation 02/ 2023   Recurrent vulvar cancer [facility]) 2016   GYN oncologist---- dr Pricilla Holm;   new dx 12/ 2016  s/p radica vulvectomy @ [facility] by dr Stanford Breed recurrent 10/ 2018 s/p modified vulvecotmy;  06/ 2021 invasive to vagina/ recurrent vulva s/p total vagainectomy & radiation; laser ablation 02/ 2023;   new lesion 12/ 2023 s/p simplec vulectomy w/ flap 01/ 2024    Past Surgical History:  Procedure Laterality Date   CATARACT EXTRACTION W/ INTRAOCULAR LENS IMPLANT Bilateral 2006   CO2 LASER APPLICATION N/A 03/20/2021   Procedure: CO2 LASER APPLICATION to vagina and vuvla;  Surgeon: Carver Fila, MD;  Location: [facility];  Service: Gynecology;  Laterality: N/A;   CO2 LASER APPLICATION N/A 05/07/2022    Procedure: POSSIBLE CO2 LASER APPLICATION TO VAGINA/VULVA;  Surgeon: Carver Fila, MD;  Location[facility];  Service: Gynecology;  Laterality: N/A;   LYMPH NODE DISSECTION Left 08/01/2015   Procedure: LYMPH NODE DISSECTION;  Surgeon: Loreli Slot, MD;  Location: [facility] OR;  Service: Thoracic;  Laterality: Left;   RADICAL VULVECTOMY  02/06/2015   @[facility]  by dr Stanford Breed   ROBOTIC ASSITED PARTIAL NEPHRECTOMY  07/26/2009   @  [facility] by dr Laverle Patter   SEGMENTECOMY Left 08/01/2015   Procedure: Left Upper Lobe SEGMENTECTOMY;  Surgeon: Loreli Slot, MD;  Location: [facility] OR;  Service: Thoracic;  Laterality: Left;   TONSILLECTOMY AND ADENOIDECTOMY     child   TOTAL ABDOMINAL HYSTERECTOMY W/ BILATERAL SALPINGOOPHORECTOMY  03/31/1995   @[facility]  by dr Stanford Breed;   TAH/BSO pelvic and periaortic lymphadenectomy   TUBAL LIGATION Bilateral    yrs ago   TUMOR REMOVAL  11/1968   meningioma  T 12   VAGINECTOMY, PARTIAL N/A 08/11/2019   Procedure: TOTAL VAGINECTOMY;  Surgeon: Adolphus Birchwood, MD;  Location: [facility];  Service: Gynecology;  Laterality: N/A;   VAGINECTOMY, PARTIAL N/A 03/20/2021   Procedure: vaginal excision;  Surgeon: Carver Fila, MD;  Location: [facility];  Service: Gynecology;  Laterality: N/A;   VIDEO ASSISTED THORACOSCOPY (VATS)/WEDGE RESECTION Left 08/01/2015   Procedure: VIDEO ASSISTED THORACOSCOPY (VATS)/WEDGE RESECTION;  Surgeon: Loreli Slot, MD;  Location: [facility] OR;  Service: Thoracic;  Laterality: Left;   VULVA Ples Specter BIOPSY N/A 02/14/2021   Procedure: VAGINAL AND VULVAR BIOPSY;  Surgeon: Carver Fila, MD;  Location: [facility];  Service: Gynecology;  Laterality: N/A;   VULVA /PERINEUM BIOPSY N/A 05/07/2022   Procedure: POSSIBLE VAGINAL  BIOPSY;  Surgeon: Carver Fila, MD;  Location: [facility];  Service: Gynecology;  Laterality: N/A;   VULVAR LESION REMOVAL N/A 02/19/2022    Procedure: VULVAR LESION EXCISION, ROTATIONAL FLAP CLOSURE;  Surgeon: Carver Fila, MD;  Location: [facility];  Service: Gynecology;  Laterality: N/A;   VULVECTOMY N/A 05/07/2022   Procedure: POSSIBLE WIDE EXCISION VULVECTOMY/VAGINA;  Surgeon: Carver Fila, MD;  Location: [facility];  Service: Gynecology;  Laterality: N/A;   VULVECTOMY PARTIAL  01/13/2017   @[facility]  by dr Stanford Breed    Family History  Problem Relation Age of Onset   Hypertension Other    Diabetes Other    Stroke Other    CAD Other    Colon cancer Neg Hx    Breast cancer Neg Hx    Ovarian cancer Neg Hx    Endometrial cancer Neg Hx    Pancreatic cancer Neg Hx    Prostate cancer Neg Hx     Social History   Socioeconomic History   Marital status: Divorced    Spouse name: Not on file   Number of children: 2   Years of education: Not on file   Highest education level: Not on file  Occupational History   Not on file  Tobacco Use   Smoking status: Never   Smokeless tobacco: Never  Vaping Use   Vaping status: Never Used  Substance and Sexual Activity   Alcohol use: Not Currently    Comment: Occ   Drug use: Never   Sexual activity: Yes    Birth control/protection: Surgical  Other Topics Concern   Not on file  Social History Narrative   Lives at home alone, works out at [employer] 6 days a week, still very active. Has an ex-husband who she is in contact with. Son died of an MI and another son in [place].    Social Determinants of Health   Financial Resource Strain: Not on file  Food Insecurity: Not on file  Transportation Needs: Not on file  Physical Activity: Not on file  Stress: Not on file  Social Connections: Not on file    Current Medications:  Current Outpatient Medications:  acetaminophen (TYLENOL) 500 MG tablet, Take 500-1,000 mg by mouth every 6 (six) hours as needed (pain.)., Disp: , Rfl:    acyclovir (ZOVIRAX) 800 MG tablet, Take 800 mg by mouth 2 (two) times  daily as needed (cold sores)., Disp: , Rfl:    albuterol (VENTOLIN HFA) 108 (90 Base) MCG/ACT inhaler, Inhale 2 puffs into the lungs every 6 (six) hours as needed for wheezing or shortness of breath., Disp: , Rfl:    amLODipine (NORVASC) 5 MG tablet, Take 5 mg by mouth every evening., Disp: , Rfl:    Ascorbic Acid (VITAMIN C PO), Take 500 mg by mouth in the morning., Disp: , Rfl:    benazepril-hydrochlorthiazide (LOTENSIN HCT) 20-25 MG tablet, Take 1 tablet by mouth in the morning., Disp: , Rfl:    benzonatate (TESSALON) 200 MG capsule, Take 200 mg by mouth 2 (two) times daily as needed for cough., Disp: , Rfl:    BIOTIN PO, Take 1 tablet by mouth in the morning., Disp: , Rfl:    Cholecalciferol (VITAMIN D3 PO), Take 2,000 Units by mouth in the morning and at bedtime., Disp: , Rfl:    docusate sodium (COLACE) 100 MG capsule, Take 100 mg by mouth 2 (two) times daily., Disp: , Rfl:    doxylamine, Sleep, (UNISOM) 25 MG tablet, Take 12.5 mg by mouth at bedtime., Disp: , Rfl:    ibuprofen (ADVIL) 200 MG tablet, Take 200-600 mg by mouth every 8 (eight) hours as needed (pain.)., Disp: , Rfl:    levothyroxine (SYNTHROID, LEVOTHROID) 75 MCG tablet, Take 75 mcg by mouth daily before breakfast., Disp: , Rfl:    MAGNESIUM PO, Take 1 tablet by mouth every evening., Disp: , Rfl:    Multiple Vitamin (MULTIVITAMIN WITH MINERALS) TABS tablet, Take 1 tablet by mouth in the morning., Disp: , Rfl:    Omega-3 Fatty Acids (FISH OIL PO), Take 1 capsule by mouth in the morning., Disp: , Rfl:    Polyethyl Glycol-Propyl Glycol (SYSTANE ULTRA) 0.4-0.3 % SOLN, Place 1-2 drops into both eyes 3 (three) times daily as needed (dry/irritated eyes.)., Disp: , Rfl:    polyethylene glycol (MIRALAX / GLYCOLAX) packet, Take 11.3333 g by mouth at bedtime. 2/3 capful, Disp: , Rfl:    simvastatin (ZOCOR) 20 MG tablet, Take 20 mg by mouth at bedtime. , Disp: , Rfl:    tacrolimus (PROTOPIC) 0.1 % ointment, Apply topically 2 (two) times  daily for 6 weeks., Disp: 30 g, Rfl: 1   TURMERIC PO, Take 1 capsule by mouth every evening., Disp: , Rfl:   Review of Systems: Denies appetite changes, fevers, chills, fatigue, unexplained weight changes. Denies hearing loss, neck lumps or masses, mouth sores, ringing in ears or voice changes. Denies cough or wheezing.  Denies shortness of breath. Denies chest pain or palpitations. Denies leg swelling. Denies abdominal distention, pain, blood in stools, constipation, diarrhea, nausea, vomiting, or early satiety. Denies pain with intercourse, dysuria, frequency, hematuria or incontinence. Denies hot flashes, pelvic pain, vaginal bleeding or vaginal discharge.   Denies joint pain, back pain or muscle pain/cramps. Denies itching, rash, or wounds. Denies dizziness, headaches, numbness or seizures. Denies swollen lymph nodes or glands, denies easy bruising or bleeding. Denies anxiety, depression, confusion, or decreased concentration.  Physical Exam: BP (!) 152/62 (BP Location: Left Arm, Patient Position: Sitting)   Pulse 66   Temp 98.6 F (37 C) (Oral)   Resp 18   Ht 5\' 5"  (1.651 m)   Wt 156 lb 8 oz (  71 kg)   SpO2 100%   BMI 26.04 kg/m  General: Alert, oriented, no acute distress. HEENT: Normocephalic, atraumatic, sclera anicteric. Chest: Unlabored breathing on room air.   GU: External genitalia notable for mild erythema, significantly improved from her last visit.  There are some leukoplakia along the mid to posterior right vulva, no atypical vasculature, no ulcerations.  The vagina is almost completely closed.  What I am able to see of the distal vagina looks healthy.  1 finger digit with my pinky is only able to palpate 1-2 cm.  No nodularity appreciated.  Laboratory & Radiologic Studies:    Latest Ref Rng & Units 08/29/2022   10:32 AM 07/18/2022   12:58 PM 06/30/2022    1:27 PM  CBC  WBC 4.0 - 10.5 K/uL 4.7  5.9  4.7   Hemoglobin 12.0 - 15.0 g/dL 16.1  09.6  04.5   Hematocrit  36.0 - 46.0 % 37.2  37.6  36.3   Platelets 150 - 400 K/uL 242  285  177       Latest Ref Rng & Units 08/29/2022   10:32 AM 07/18/2022   12:58 PM 06/30/2022    1:27 PM  BMP  Glucose 70 - 99 mg/dL 409  96  85   BUN 8 - 23 mg/dL 17  19  19    Creatinine 0.44 - 1.00 mg/dL 8.11  9.14  7.82   Sodium 135 - 145 mmol/L 132  131  131   Potassium 3.5 - 5.1 mmol/L 3.3  3.8  3.6   Chloride 98 - 111 mmol/L 96  95  94   CO2 22 - 32 mmol/L 26  29  27    Calcium 8.9 - 10.3 mg/dL 9.4  9.6  9.1    Assessment & Plan: [name] Richards is a 77 y.o. 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  Not tolerate carboplatin, now on pembrolizumab setting of a CPS score of 90%.  The patient is overall doing very well.  Has had several flares of her lichen sclerosus that are treated with tacrolimus.  She has gotten excellent relief from this.  Her exam today is more normal than it has been perhaps since I met her.  Her vagina is almost completely agglutinated in the setting of multiple prior treatments and surgeries.  Will continue to follow her clinically from a vulvar standpoint.  Will need to rely on surveillance imaging as well.  I will see her back for follow-up in 3 months.  20 minutes of total time was spent for this patient encounter, including preparation, face-to-face counseling with the patient and coordination of care, and documentation of the encounter.  Eugene Garnet, MD  Division of Gynecologic Oncology  Department of Obstetrics and Gynecology  [facility]

## 2022-09-04 NOTE — Telephone Encounter (Signed)
Pt  called to inquire about Pet scan prior authorization informed for Pet scan scheduled 09/22/22 informed Pt review is pending and she should return call closer to the time of the appointment to see if it was approved. Pt verbalized understanding. Message sent to

## 2022-09-04 NOTE — Patient Instructions (Signed)
It was good to see you today. I will see you for follow-up in 3 months.

## 2022-09-05 ENCOUNTER — Other Ambulatory Visit: Payer: Self-pay

## 2022-09-09 DIAGNOSIS — M4805 Spinal stenosis, thoracolumbar region: Secondary | ICD-10-CM | POA: Diagnosis not present

## 2022-09-22 ENCOUNTER — Ambulatory Visit (HOSPITAL_COMMUNITY)
Admission: RE | Admit: 2022-09-22 | Discharge: 2022-09-22 | Disposition: A | Discharge status: Home / Self Care | Payer: Medicare Other | Source: Ambulatory Visit | Attending: Hematology and Oncology | Admitting: Hematology and Oncology

## 2022-09-22 ENCOUNTER — Encounter (HOSPITAL_COMMUNITY): Payer: Self-pay

## 2022-09-22 ENCOUNTER — Encounter (HOSPITAL_COMMUNITY): Payer: Medicare Other

## 2022-09-22 DIAGNOSIS — N8110 Cystocele, unspecified: Secondary | ICD-10-CM | POA: Diagnosis not present

## 2022-09-22 DIAGNOSIS — C52 Malignant neoplasm of vagina: Secondary | ICD-10-CM | POA: Insufficient documentation

## 2022-09-22 DIAGNOSIS — I6523 Occlusion and stenosis of bilateral carotid arteries: Secondary | ICD-10-CM | POA: Diagnosis not present

## 2022-09-22 DIAGNOSIS — I700 Atherosclerosis of aorta: Secondary | ICD-10-CM | POA: Diagnosis not present

## 2022-09-22 DIAGNOSIS — I2510 Atherosclerotic heart disease of native coronary artery without angina pectoris: Secondary | ICD-10-CM | POA: Diagnosis not present

## 2022-09-22 LAB — GLUCOSE, CAPILLARY: Glucose-Capillary: 107 mg/dL — ABNORMAL HIGH (ref 70–99)

## 2022-09-22 MED ORDER — FLUDEOXYGLUCOSE F - 18 (FDG) INJECTION
7.7000 | Freq: Once | INTRAVENOUS | Status: AC
  Administered 2022-09-22: 7.7 via INTRAVENOUS

## 2022-09-25 DIAGNOSIS — M4805 Spinal stenosis, thoracolumbar region: Secondary | ICD-10-CM | POA: Diagnosis not present

## 2022-09-26 ENCOUNTER — Inpatient Hospital Stay: Payer: Medicare Other | Attending: Gynecologic Oncology

## 2022-09-26 ENCOUNTER — Inpatient Hospital Stay: Payer: Medicare Other | Admitting: Hematology and Oncology

## 2022-09-26 ENCOUNTER — Other Ambulatory Visit: Payer: Self-pay

## 2022-09-26 ENCOUNTER — Encounter: Payer: Self-pay | Admitting: Hematology and Oncology

## 2022-09-26 VITALS — BP 167/60 | HR 60 | Temp 97.7°F | Resp 18 | Ht 65.0 in | Wt 158.8 lb

## 2022-09-26 VITALS — BP 151/68 | HR 56

## 2022-09-26 DIAGNOSIS — I10 Essential (primary) hypertension: Secondary | ICD-10-CM | POA: Diagnosis not present

## 2022-09-26 DIAGNOSIS — C52 Malignant neoplasm of vagina: Secondary | ICD-10-CM

## 2022-09-26 DIAGNOSIS — C3412 Malignant neoplasm of upper lobe, left bronchus or lung: Secondary | ICD-10-CM | POA: Diagnosis not present

## 2022-09-26 DIAGNOSIS — Z5112 Encounter for antineoplastic immunotherapy: Secondary | ICD-10-CM | POA: Diagnosis not present

## 2022-09-26 DIAGNOSIS — Z85118 Personal history of other malignant neoplasm of bronchus and lung: Secondary | ICD-10-CM | POA: Insufficient documentation

## 2022-09-26 LAB — CMP (CANCER CENTER ONLY)
ALT: 15 U/L (ref 0–44)
AST: 17 U/L (ref 15–41)
Albumin: 4.2 g/dL (ref 3.5–5.0)
Alkaline Phosphatase: 92 U/L (ref 38–126)
Anion gap: 9 (ref 5–15)
BUN: 15 mg/dL (ref 8–23)
CO2: 28 mmol/L (ref 22–32)
Calcium: 9.2 mg/dL (ref 8.9–10.3)
Chloride: 96 mmol/L — ABNORMAL LOW (ref 98–111)
Creatinine: 0.6 mg/dL (ref 0.44–1.00)
GFR, Estimated: >60 mL/min (ref >60)
Glucose, Bld: 105 mg/dL — ABNORMAL HIGH (ref 70–99)
Potassium: 3.3 mmol/L — ABNORMAL LOW (ref 3.5–5.1)
Sodium: 133 mmol/L — ABNORMAL LOW (ref 135–145)
Total Bilirubin: 0.5 mg/dL (ref 0.3–1.2)
Total Protein: 7.1 g/dL (ref 6.5–8.1)

## 2022-09-26 LAB — CBC WITH DIFFERENTIAL (CANCER CENTER ONLY)
Abs Immature Granulocytes: 0.02 10*3/uL (ref 0.00–0.07)
Basophils Absolute: 0 10*3/uL (ref 0.0–0.1)
Basophils Relative: 1 %
Eosinophils Absolute: 0.1 10*3/uL (ref 0.0–0.5)
Eosinophils Relative: 1 %
HCT: 37.5 % (ref 36.0–46.0)
Hemoglobin: 11.8 g/dL — ABNORMAL LOW (ref 12.0–15.0)
Immature Granulocytes: 1 %
Lymphocytes Relative: 22 %
Lymphs Abs: 1 10*3/uL (ref 0.7–4.0)
MCH: 21.5 pg — ABNORMAL LOW (ref 26.0–34.0)
MCHC: 31.5 g/dL (ref 30.0–36.0)
MCV: 68.4 fL — ABNORMAL LOW (ref 80.0–100.0)
Monocytes Absolute: 0.4 10*3/uL (ref 0.1–1.0)
Monocytes Relative: 10 %
Neutro Abs: 2.9 10*3/uL (ref 1.7–7.7)
Neutrophils Relative %: 65 %
Platelet Count: 230 10*3/uL (ref 150–400)
RBC: 5.48 MIL/uL — ABNORMAL HIGH (ref 3.87–5.11)
RDW: 14.6 % (ref 11.5–15.5)
WBC Count: 4.4 10*3/uL (ref 4.0–10.5)
nRBC: 0 % (ref 0.0–0.2)

## 2022-09-26 MED ORDER — SODIUM CHLORIDE 0.9 % IV SOLN
200.0000 mg | Freq: Once | INTRAVENOUS | Status: AC
  Administered 2022-09-26: 200 mg via INTRAVENOUS
  Filled 2022-09-26: qty 200

## 2022-09-26 MED ORDER — SODIUM CHLORIDE 0.9 % IV SOLN: Freq: Once | INTRAVENOUS | Status: AC

## 2022-09-26 NOTE — Assessment & Plan Note (Signed)
She has intermittent hypertension likely due to whitecoat hypertension She will continue her current antihypertensives

## 2022-09-26 NOTE — Progress Notes (Signed)
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[place]) She has no side effects on pembrolizumab I reviewed PET/CT imaging with the patient Clinically, she have no signs of cancer recurrence despite report from radiologist related to hypermetabolic inguinal lymph node She is comfortable to proceed with treatment as scheduled The patient would like to get her pembrolizumab changed to 6 weeks dose After completion of today's treatment, I will switch her treatment to every 6 weeks starting next cycle I do not plan to repeat imaging study until next year She has follow-up with GYN oncologist in 2 months  Primary cancer of left upper lobe of lung (HCC) She has no signs of cancer recurrence in her lungs  Essential hypertension She has intermittent hypertension likely due to whitecoat hypertension She will continue her current antihypertensives  No orders of the defined types were placed in this encounter.   All questions were answered. The patient knows to call the clinic with any problems, questions or concerns. The total time spent in the appointment was 30 minutes encounter with patients including review of chart and various tests results, discussions about plan of care and coordination of care plan   [name], MD 09/26/2022 12:18 PM  INTERVAL HISTORY: Please see below for problem oriented charting. she returns for treatment follow-up We discussed imaging studies result She is completely asymptomatic and have no side effects from treatment  REVIEW OF SYSTEMS:   Constitutional: Denies fevers, chills or abnormal weight loss Eyes: Denies blurriness of vision Ears, nose, mouth, throat, and face: Denies mucositis or sore throat Respiratory:  Denies cough, dyspnea or wheezes Cardiovascular: Denies palpitation, chest discomfort or lower extremity swelling Gastrointestinal:  Denies nausea, heartburn or change in bowel habits Skin: Denies abnormal skin rashes Lymphatics: Denies new lymphadenopathy or easy bruising Neurological:Denies numbness, tingling or new weaknesses Behavioral/Psych: Mood is stable, no new changes  All other systems were reviewed with the patient and are negative.  I have reviewed the past medical history, past surgical history, social history and family history with the patient and they are unchanged from previous note.  ALLERGIES:  is allergic to penicillins, fentanyl, codeine, compazine [prochlorperazine], percocet [oxycodone-acetaminophen], and trazodone hcl.  MEDICATIONS:  Current Outpatient Medications  Medication Sig Dispense Refill   acetaminophen (TYLENOL) 500 MG tablet Take 500-1,000 mg by mouth every 6 (six) hours as needed (pain.).     acyclovir (ZOVIRAX) 800 MG tablet Take 800 mg by mouth 2 (two) times daily as needed (cold sores).     albuterol (VENTOLIN HFA) 108 (90 Base) MCG/ACT inhaler Inhale 2 puffs into the lungs every 6 (six) hours as needed for wheezing or shortness of breath.     amLODipine (NORVASC) 5 MG tablet Take 5 mg by mouth every evening.     Ascorbic Acid (VITAMIN C PO) Take 500 mg by mouth in the morning.     benazepril-hydrochlorthiazide (LOTENSIN HCT) 20-25 MG tablet Take 1 tablet by mouth in the morning.     benzonatate (TESSALON) 200 MG capsule Take 200 mg by mouth 2 (two) times daily as needed for cough.     BIOTIN PO Take 1 tablet by mouth in the morning.     Cholecalciferol (VITAMIN D3 PO) Take 2,000 Units by mouth in the morning and at bedtime.  docusate sodium (COLACE) 100 MG capsule Take 100 mg by mouth 2 (two) times daily.     doxylamine, Sleep, (UNISOM) 25 MG tablet Take 12.5 mg by mouth at bedtime.     ibuprofen (ADVIL) 200 MG tablet Take 200-600 mg by mouth every  8 (eight) hours as needed (pain.).     levothyroxine (SYNTHROID, LEVOTHROID) 75 MCG tablet Take 75 mcg by mouth daily before breakfast.     MAGNESIUM PO Take 1 tablet by mouth every evening.     Multiple Vitamin (MULTIVITAMIN WITH MINERALS) TABS tablet Take 1 tablet by mouth in the morning.     Omega-3 Fatty Acids (FISH OIL PO) Take 1 capsule by mouth in the morning.     Polyethyl Glycol-Propyl Glycol (SYSTANE ULTRA) 0.4-0.3 % SOLN Place 1-2 drops into both eyes 3 (three) times daily as needed (dry/irritated eyes.).     polyethylene glycol (MIRALAX / GLYCOLAX) packet Take 11.3333 g by mouth at bedtime. 2/3 capful     simvastatin (ZOCOR) 20 MG tablet Take 20 mg by mouth at bedtime.      tacrolimus (PROTOPIC) 0.1 % ointment Apply topically 2 (two) times daily for 6 weeks. 30 g 1   TURMERIC PO Take 1 capsule by mouth every evening.     No current facility-administered medications for this visit.   Facility-Administered Medications Ordered in Other Visits  Medication Dose Route Frequency Provider Last Rate Last Admin   0.9 %  sodium chloride infusion   Intravenous Once Bertis Ruddy, Maxamillion Banas, MD       pembrolizumab (KEYTRUDA) 200 mg in sodium chloride 0.9 % 50 mL chemo infusion  200 mg Intravenous Once [name], MD        SUMMARY OF ONCOLOGIC HISTORY: Oncology History Overview Note  Patient followed due to HX pulmonary nodules noted 2011  Primary cancer of left upper lobe of lung (HCC)   Staging form: Lung, AJCC 7th Edition   - Clinical stage from 09/27/2015: Stage IA (T1b, N0, M0) - Signed by Si Gaul, MD on 09/27/2015  PDL-1 90%    Primary cancer of left upper lobe of lung (HCC)  07/12/2015 Imaging   CT Chest IMPRESSION:  The solid component of the large part solid nodule in the left upper lobe measures 9 x 3 mm (mean diameter 6 mm.) persistent part solid nodules with solid components greater than or equal to 6 mm should be considered highly suspicious for pulmonary adenocarcinoma    08/01/2015 Initial Diagnosis   Primary cancer of left upper lobe of lung (HCC)   08/01/2015 Surgery   PROCEDURE:  Left video-assisted thoracoscopy,           Wedge resection,           Thoracoscopic lingular sparing left upper lobectomy           Lymph node dissection           On-Q local anesthetic catheter placement   08/01/2015 Pathology Results   Lung, resection (segmental or lobe), Left Upper Lobe - ADENOCARCINOMA, WELL DIFFERENTIATED, SPANNING 2.5 CM. - THE SURGICAL RESECTION MARGINS ARE NEGATIVE FOR CARCINOMA. 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 She underwent a total abdominal hysterectomy and bilateral salpingo-oophorectomy, pelvic and para-aortic lymphadenectomy. She received postoperative whole pelvis radiation therapy.    02/06/2015 Surgery   In December 2016 the patient was found to have a new primary squamous cell carcinoma the vulva undergoing a modified radical vulvectomy on 02/06/2015. She underwent a modified radical vulvectomy at [facility] on 02/06/2015. She was found to have a 3 cm vulvar lesion of the posterior vulva extending into the posterior vagina. A rhomboid flap was required to close the incision.  Final pathology showed some close surgical margins. However, the specimen margins were toward the time of surgery and it was Dr Nelwyn Salisbury impression that they were widely around the primary cancer. She discussed options with the patient and they agreed to monitor her closely  but not recommend any radiation therapy to the vulva.   01/13/2017 Surgery   She experienced recurrent vulvar cancer October 2018 managed by a small modified radical vulvectomy of the right side on January 13, 2017.  Depth of invasion was 4 mm all margins were negative    03/13/2017 PET scan   1. No evidence of recurrent or metastatic disease. 2. Aortic atherosclerosis (ICD10-170.0). Coronary artery calcification.   07/20/2019 Miscellaneous   Examination of the vulva during routine surveillance on 07/20/19 showed a friable, verrucous lesion measuring approximately 2 cm was seen on the anterior wall of the suburethral vagina extending to the introitus and wrapping around to the right mid vagina. This was concerning for recurrence and was biopsied at that appointment. 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    08/11/2019 Miscellaneous   On 08/11/19 she underwent total vaginectomy. Intraoperative findings were significant for a forshortened 3cm vagina, friable 4cm plaque on the anterior vagina extending to the right fornix and left posterior vaginal wall. This necessitated a total vaginectomy to resect the lesion grossly. Her tissues were extremely friable due to age, prior radiation, menopause and lichen sclerosis. There was unavoidable fragmentation of the tissues during the resection. All gross visible disease was removed. Surgery was uncomplicated, and she reported no postop pain.  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 The suspicious foci are present at the edges of tissue fragments.  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        09/13/2019 PET scan   1. No specific evidence of recurrent vulvar carcinoma or distant metastases. There is nonspecific activity in the  region of the vaginal introitus which could reflect urine contamination or vaginal recurrence. Correlation with physical examination recommended.  2. No abnormal thoracic activity status post left upper lobe wedge resection. 3. Coronary and Aortic Atherosclerosis (ICD10-I70.0).   09/29/2019 - 10/12/2019 Radiation Therapy   The patient received adjuvant vaginal brachytherapy between 09/29/2019 through 10/12/2019.     02/28/2021 PET scan   1. Interval decrease in degree of FDG uptake associated with the vaginal introitus. The SUV max on today's study is equal to 5.96 compared with 9.17 previously. 2. No highly concerning features to suggest nodal metastasis or distant metastatic disease. Mildly increased tracer uptake within and morphologically benign left inguinal lymph node is identified which warrants attention on follow-up imaging. 3. Stable postoperative changes within  the left upper lobe without signs to suggest locally recurrent tumor. 4. Aortic Atherosclerosis (ICD10-I70.0). Coronary artery calcifications.   03/11/2021 Initial Diagnosis   Recurrent vaginal cancer   03/20/2021 Procedure   03/20/21: Vaginectomy, CO2 laser of the vulva for recurrent vaginal cancer, high-grade vulvar dysplasia.  Vaginal excision showed squamous mucosa with ulceration and inflammation.  No malignancy or dysplasia identified.  Rare atypical "radiation" fibroblast embedded.    10/24/2021 Imaging   1. No acute abnormality of the abdomen or pelvis. 2. Aortic Atherosclerosis (ICD10-I70.0).   02/19/2022 Surgery   Preop Diagnosis: history of vulvar and vaginal cancer, suspicion for recurrent vulvar cancer   Postoperative Diagnosis: same as above   Surgery: Partial simple right/posterior vulvectomy with rotational rhomboid flap    Surgeons: Carin Hock MD    Pathology: posterior vulva (6-8 o'c) with stitch at 12, additional superior, lateral and inferior margins, deep margin   Operative findings: 2 x 2.5 cm  verrucous appearing lesion along the perineum and extending to the introitus, relatively broad 2 cm base. Given size of defect and its location, required rotational flap to close defect. Hyperkeratotic areas within the significantly foreshortened vagina.   03/13/2022 PET scan    ADDENDUM REPORT: 03/14/2022 08:44   ADDENDUM: I just discussed this study with Dr. Pricilla Holm. The patient had surgery about 3 weeks ago for recurrent disease at the vaginal introitus with no residual disease apparent clinically. Additionally, the patient has had no chemotherapy in the 1 year interval since the prior study and no radiation therapy to the left groin region. In light of this additional history, the changes in the region the vaginal introitus today may well simply represent urinary contaminant rather than residual disease. Further, given lack of interval systemic chemotherapy and no interval focused therapy to the left groin lymph node, the interval stability in this node (9 mm previously versus 10 mm today) and stable low level FDG accumulation ( SUV max = 2.9 today compared to 3.0 previously) would suggest a reactive etiology as more likely, especially in light of the recurrent surgeries to the external genitalia.     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  Approximately 1 x 1 cm raised mass along the right distal vagina from 6-8 o'clock.  Second raised mass along the left vaginal sidewall extending almost up to the vaginal apex measuring approximately 1 x 1.5  cm.  Several small, less than 5 mm, masses along the vaginal apex and superior wall of the posterior vagina.  Well-healed lumbar incision from recent surgery.  Significant loss of architecture of the vulva with erythema noted of bilateral anterior labia and along the perineum.  Multiple biopsies taken including 2 and 10:00 along the vulva and at 5:00 along the posterior vulva.     05/07/2022 Pathology Results   CASE: [id number] PATIENT: Beatrix Shipper Surgical Pathology Report  Clinical History: Recurrent vulvar cancer (crm)  FINAL MICROSCOPIC DIAGNOSIS:  A. VAGINAL LESION, DISTAL RIGHT, EXCISION:      Invasive squamous cell carcinoma.      Cauterized carcinoma present at the mucosal margin. Deep  margin are negative for carcinoma.  B. VAGINAL SIDEWALL, LEFT, EXCISION:      Invasive squamous cell carcinoma.      Cauterized carcinoma present at the mucosal margin. Deep margin are negative for carcinoma.  C. VAGINA, APEX, EXCISION:      At least squamous cell carcinoma in situ.  D. VAGINA, POSTERIOR, BIOPSY:      At least squamous cell carcinoma in situ.  E. VULVA, RIGHT, BIOPSY:      Squamous mucosa with acute inflammation and reactive epithelial changes.      Negative for dysplasia or malignancy.  F. VULVA, LEFT, BIOPSY:      Squamous mucosa with acute and chronic inflammation and reactive epithelial changes.      Negative for dysplasia or malignancy.  G. VULVA, POSTERIOR, 5:00, BIOPSY:      Squamous mucosa with acute and chronic inflammation and reactive epithelial changes.      Negative for dysplasia or malignancy.     06/03/2022 Cancer Staging   Staging form: Vagina, AJCC 8th Edition - Clinical: cT1, cN0, cM0 - Signed by [name], MD on 06/03/2022   06/09/2022 - 06/09/2022 Chemotherapy   Patient is on Treatment Plan : UTERINE Pembrolizumab (200) q21d     06/09/2022 - 06/09/2022 Chemotherapy   Patient is on Treatment Plan : UTERINE Carboplatin (AUC 5) q21d      06/30/2022 -  Chemotherapy   Patient is on Treatment Plan : UTERINE Pembrolizumab (200) q21d     09/26/2022 PET scan   NM PET Image Restage (PS) Skull Base to Thigh (F-18 FDG)  Result Date: 09/25/2022 CLINICAL DATA:  Subsequent treatment strategy for recurrent vaginal cancer. Currently on immunotherapy. Biopsy 05/07/2022. EXAM: NUCLEAR MEDICINE PET SKULL BASE TO THIGH TECHNIQUE: 7.7 mCi F-18 FDG was injected intravenously. Full-ring PET imaging was performed from the skull base to thigh after the radiotracer. CT data was obtained and used for attenuation correction and anatomic localization. Fasting blood glucose: 107 mg/dl COMPARISON:  16/11/9602 FINDINGS: Mediastinal blood pool activity: SUV max 2.2 Liver activity: SUV max NA NECK: No areas of abnormal hypermetabolism. Incidental CT findings: Cerebral atrophy. No cervical adenopathy. Bilateral carotid atherosclerosis. CHEST: No pulmonary parenchymal or thoracic nodal hypermetabolism. Incidental CT findings: Surgical changes about the left hilum and left upper lobe consistent with clinical history of prior wedge resection. Aortic and coronary artery calcification. Esophageal fluid level on 60/4. 3 mm right lower lobe pulmonary nodule on 78/4 is unchanged. ABDOMEN/PELVIS: No abdominopelvic parenchymal hypermetabolism. Abdominopelvic nodal dissection. Mildly hypermetabolic left inguinal nodes. The more cephalad measures 8 mm and a S.U.V. max of 2.6 on 168/4 versus 6 mm and a S.U.V. max of 1.8 on the prior. More caudal measures 1.0 cm and a S.U.V. max of 3.7 on 173/4 versus similar in size and a S.U.V. max of 2.9 on the prior. Incidental CT findings: Scarred upper pole right kidney. Normal adrenal glands. Abdominal aortic atherosclerosis. Hysterectomy. Pelvic floor laxity with mild cystocele. SKELETON: No abnormal marrow activity. Incidental CT findings: Right-greater-than-left hip osteoarthritis. IMPRESSION: 1. Left inguinal nodes which demonstrate mild  increase in hypermetabolism. 1 has minimally enlarged. These are again favored to be reactive but warrant follow-up attention. 2. Otherwise, no evidence of recurrent or metastatic disease, status post hysterectomy and abdominopelvic node dissection. 3. Incidental findings, including: Coronary artery atherosclerosis. Aortic Atherosclerosis (ICD10-I70.0). Esophageal air fluid level suggests dysmotility or gastroesophageal reflux. Electronically Signed   By: Jeronimo Greaves M.D.   On: 09/25/2022 14:57  PHYSICAL EXAMINATION: ECOG PERFORMANCE STATUS: 0 - Asymptomatic  Vitals:   09/26/22 1153  BP: (!) 167/60  Pulse: 60  Resp: 18  Temp: 97.7 F (36.5 C)  SpO2: 100%   Filed Weights   09/26/22 1153  Weight: 158 lb 12.8 oz (72 kg)    GENERAL:alert, no distress and comfortable SKIN: skin color, texture, turgor are normal, no rashes or significant lesions EYES: normal, Conjunctiva are pink and non-injected, sclera clear OROPHARYNX:no exudate, no erythema and lips, buccal mucosa, and tongue normal  NECK: supple, thyroid normal size, non-tender, without nodularity LYMPH:  no palpable lymphadenopathy in the cervical, axillary or inguinal LUNGS: clear to auscultation and percussion with normal breathing effort HEART: regular rate & rhythm and no murmurs and no lower extremity edema ABDOMEN:abdomen soft, non-tender and normal bowel sounds Musculoskeletal:no cyanosis of digits and no clubbing  NEURO: alert & oriented x 3 with fluent speech, no focal motor/sensory deficits  LABORATORY DATA:  I have reviewed the data as listed    Component Value Date/Time   NA 133 (L) 09/26/2022 1127   NA 136 09/18/2016 1536   K 3.3 (L) 09/26/2022 1127   K 3.9 09/18/2016 1536   CL 96 (L) 09/26/2022 1127   CO2 28 09/26/2022 1127   CO2 26 09/18/2016 1536   GLUCOSE 105 (H) 09/26/2022 1127   GLUCOSE 172 (H) 09/18/2016 1536   BUN 15 09/26/2022 1127   BUN 15.8 09/18/2016 1536   CREATININE 0.60 09/26/2022  1127   CREATININE 0.8 09/18/2016 1536   CALCIUM 9.2 09/26/2022 1127   CALCIUM 9.3 09/18/2016 1536   PROT 7.1 09/26/2022 1127   PROT 6.3 (L) 09/18/2016 1536   ALBUMIN 4.2 09/26/2022 1127   ALBUMIN 3.6 09/18/2016 1536   AST 17 09/26/2022 1127   AST 18 09/18/2016 1536   ALT 15 09/26/2022 1127   ALT 17 09/18/2016 1536   ALKPHOS 92 09/26/2022 1127   ALKPHOS 91 09/18/2016 1536   BILITOT 0.5 09/26/2022 1127   BILITOT 0.42 09/18/2016 1536   GFRNONAA >60 09/26/2022 1127   GFRAA >60 08/04/2019 0901   GFRAA >60 09/13/2018 0808    No results found for: "SPEP", "UPEP"  Lab Results  Component Value Date   WBC 4.4 09/26/2022   NEUTROABS 2.9 09/26/2022   HGB 11.8 (L) 09/26/2022   HCT 37.5 09/26/2022   MCV 68.4 (L) 09/26/2022   PLT 230 09/26/2022      Chemistry      Component Value Date/Time   NA 133 (L) 09/26/2022 1127   NA 136 09/18/2016 1536   K 3.3 (L) 09/26/2022 1127   K 3.9 09/18/2016 1536   CL 96 (L) 09/26/2022 1127   CO2 28 09/26/2022 1127   CO2 26 09/18/2016 1536   BUN 15 09/26/2022 1127   BUN 15.8 09/18/2016 1536   CREATININE 0.60 09/26/2022 1127   CREATININE 0.8 09/18/2016 1536      Component Value Date/Time   CALCIUM 9.2 09/26/2022 1127   CALCIUM 9.3 09/18/2016 1536   ALKPHOS 92 09/26/2022 1127   ALKPHOS 91 09/18/2016 1536   AST 17 09/26/2022 1127   AST 18 09/18/2016 1536   ALT 15 09/26/2022 1127   ALT 17 09/18/2016 1536   BILITOT 0.5 09/26/2022 1127   BILITOT 0.42 09/18/2016 1536       RADIOGRAPHIC STUDIES: I have reviewed imaging studies with the patient I have personally reviewed the radiological images as listed and agreed with the findings in the report. NM PET Image Restage (  PS) Skull Base to Thigh (F-18 FDG)  Result Date: 09/25/2022 CLINICAL DATA:  Subsequent treatment strategy for recurrent vaginal cancer. Currently on immunotherapy. Biopsy 05/07/2022. EXAM: NUCLEAR MEDICINE PET SKULL BASE TO THIGH TECHNIQUE: 7.7 mCi F-18 FDG was injected  intravenously. Full-ring PET imaging was performed from the skull base to thigh after the radiotracer. CT data was obtained and used for attenuation correction and anatomic localization. Fasting blood glucose: 107 mg/dl COMPARISON:  09/81/1914 FINDINGS: Mediastinal blood pool activity: SUV max 2.2 Liver activity: SUV max NA NECK: No areas of abnormal hypermetabolism. Incidental CT findings: Cerebral atrophy. No cervical adenopathy. Bilateral carotid atherosclerosis. CHEST: No pulmonary parenchymal or thoracic nodal hypermetabolism. Incidental CT findings: Surgical changes about the left hilum and left upper lobe consistent with clinical history of prior wedge resection. Aortic and coronary artery calcification. Esophageal fluid level on 60/4. 3 mm right lower lobe pulmonary nodule on 78/4 is unchanged. ABDOMEN/PELVIS: No abdominopelvic parenchymal hypermetabolism. Abdominopelvic nodal dissection. Mildly hypermetabolic left inguinal nodes. The more cephalad measures 8 mm and a S.U.V. max of 2.6 on 168/4 versus 6 mm and a S.U.V. max of 1.8 on the prior. More caudal measures 1.0 cm and a S.U.V. max of 3.7 on 173/4 versus similar in size and a S.U.V. max of 2.9 on the prior. Incidental CT findings: Scarred upper pole right kidney. Normal adrenal glands. Abdominal aortic atherosclerosis. Hysterectomy. Pelvic floor laxity with mild cystocele. SKELETON: No abnormal marrow activity. Incidental CT findings: Right-greater-than-left hip osteoarthritis. IMPRESSION: 1. Left inguinal nodes which demonstrate mild increase in hypermetabolism. 1 has minimally enlarged. These are again favored to be reactive but warrant follow-up attention. 2. Otherwise, no evidence of recurrent or metastatic disease, status post hysterectomy and abdominopelvic node dissection. 3. Incidental findings, including: Coronary artery atherosclerosis. Aortic Atherosclerosis (ICD10-I70.0). Esophageal air fluid level suggests dysmotility or gastroesophageal  reflux. Electronically Signed   By: Jeronimo Greaves M.D.   On: 09/25/2022 14:57

## 2022-09-26 NOTE — Patient Instructions (Signed)
[facility]  Discharge Instructions: Thank you for choosing [facility] to provide your oncology and hematology care.   If you have a lab appointment with the Cancer Center, please go directly to the Cancer Center and check in at the registration area.   Wear comfortable clothing and clothing appropriate for easy access to any Portacath or PICC line.   We strive to give you quality time with your provider. You may need to reschedule your appointment if you arrive late (15 or more minutes).  Arriving late affects you and other patients whose appointments are after yours.  Also, if you miss three or more appointments without notifying the office, you may be dismissed from the clinic at the provider's discretion.      For prescription refill requests, have your pharmacy contact our office and allow 72 hours for refills to be completed.    Today you received the following chemotherapy and/or immunotherapy agents: pembrolizumab      To help prevent nausea and vomiting after your treatment, we encourage you to take your nausea medication as directed.  BELOW ARE SYMPTOMS THAT SHOULD BE REPORTED IMMEDIATELY: *FEVER GREATER THAN 100.4 F (38 C) OR HIGHER *CHILLS OR SWEATING *NAUSEA AND VOMITING THAT IS NOT CONTROLLED WITH YOUR NAUSEA MEDICATION *UNUSUAL SHORTNESS OF BREATH *UNUSUAL BRUISING OR BLEEDING *URINARY PROBLEMS (pain or burning when urinating, or frequent urination) *BOWEL PROBLEMS (unusual diarrhea, constipation, pain near the anus) TENDERNESS IN MOUTH AND THROAT WITH OR WITHOUT PRESENCE OF ULCERS (sore throat, sores in mouth, or a toothache) UNUSUAL RASH, SWELLING OR PAIN  UNUSUAL VAGINAL DISCHARGE OR ITCHING   Items with * indicate a potential emergency and should be followed up as soon as possible or go to the Emergency Department if any problems should occur.  Please show the CHEMOTHERAPY ALERT CARD or IMMUNOTHERAPY ALERT CARD at  check-in to the Emergency Department and triage nurse.  Should you have questions after your visit or need to cancel or reschedule your appointment, please contact [facility]  Dept: [phone]  and follow the prompts.  Office hours are 8:00 a.m. to 4:30 p.m. Monday - Friday. Please note that voicemails left after 4:00 p.m. may not be returned until the following business day.  We are closed weekends and major holidays. You have access to a nurse at all times for urgent questions. Please call the main number to the clinic Dept: [phone] and follow the prompts.   For any non-urgent questions, you may also contact your provider using MyChart. We now offer e-Visits for anyone 18 and older to request care online for non-urgent symptoms. For details visit [email or web address].   Also download the MyChart app! Go to the app store, search "MyChart", open the app, select , and log in with your MyChart username and password.   

## 2022-09-26 NOTE — Assessment & Plan Note (Signed)
She has no side effects on pembrolizumab I reviewed PET/CT imaging with the patient Clinically, she have no signs of cancer recurrence despite report from radiologist related to hypermetabolic inguinal lymph node She is comfortable to proceed with treatment as scheduled The patient would like to get her pembrolizumab changed to 6 weeks dose After completion of today's treatment, I will switch her treatment to every 6 weeks starting next cycle I do not plan to repeat imaging study until next year She has follow-up with GYN oncologist in 2 months

## 2022-09-26 NOTE — Assessment & Plan Note (Signed)
She has no signs of cancer recurrence in her lungs

## 2022-09-29 ENCOUNTER — Other Ambulatory Visit: Payer: Self-pay | Admitting: Hematology and Oncology

## 2022-10-02 DIAGNOSIS — M4805 Spinal stenosis, thoracolumbar region: Secondary | ICD-10-CM | POA: Diagnosis not present

## 2022-10-09 DIAGNOSIS — M4805 Spinal stenosis, thoracolumbar region: Secondary | ICD-10-CM | POA: Diagnosis not present

## 2022-10-15 DIAGNOSIS — M1712 Unilateral primary osteoarthritis, left knee: Secondary | ICD-10-CM | POA: Diagnosis not present

## 2022-10-15 DIAGNOSIS — M47816 Spondylosis without myelopathy or radiculopathy, lumbar region: Secondary | ICD-10-CM | POA: Diagnosis not present

## 2022-10-17 ENCOUNTER — Inpatient Hospital Stay (HOSPITAL_BASED_OUTPATIENT_CLINIC_OR_DEPARTMENT_OTHER): Payer: Medicare Other | Admitting: Hematology and Oncology

## 2022-10-17 ENCOUNTER — Encounter: Payer: Self-pay | Admitting: Hematology and Oncology

## 2022-10-17 ENCOUNTER — Inpatient Hospital Stay: Payer: Medicare Other | Attending: Gynecologic Oncology

## 2022-10-17 ENCOUNTER — Inpatient Hospital Stay: Payer: Medicare Other

## 2022-10-17 VITALS — BP 153/61 | HR 63 | Temp 98.2°F | Resp 18 | Ht 65.0 in | Wt 159.2 lb

## 2022-10-17 DIAGNOSIS — I10 Essential (primary) hypertension: Secondary | ICD-10-CM | POA: Insufficient documentation

## 2022-10-17 DIAGNOSIS — Z5112 Encounter for antineoplastic immunotherapy: Secondary | ICD-10-CM | POA: Insufficient documentation

## 2022-10-17 DIAGNOSIS — Z79620 Long term (current) use of immunosuppressive biologic: Secondary | ICD-10-CM | POA: Insufficient documentation

## 2022-10-17 DIAGNOSIS — C3412 Malignant neoplasm of upper lobe, left bronchus or lung: Secondary | ICD-10-CM | POA: Diagnosis not present

## 2022-10-17 DIAGNOSIS — C52 Malignant neoplasm of vagina: Secondary | ICD-10-CM

## 2022-10-17 LAB — CMP (CANCER CENTER ONLY)
ALT: 15 U/L (ref 0–44)
AST: 15 U/L (ref 15–41)
Albumin: 4.2 g/dL (ref 3.5–5.0)
Alkaline Phosphatase: 92 U/L (ref 38–126)
Anion gap: 7 (ref 5–15)
BUN: 18 mg/dL (ref 8–23)
CO2: 29 mmol/L (ref 22–32)
Calcium: 9.5 mg/dL (ref 8.9–10.3)
Chloride: 96 mmol/L — ABNORMAL LOW (ref 98–111)
Creatinine: 0.61 mg/dL (ref 0.44–1.00)
GFR, Estimated: >60 mL/min (ref >60)
Glucose, Bld: 125 mg/dL — ABNORMAL HIGH (ref 70–99)
Potassium: 3.6 mmol/L (ref 3.5–5.1)
Sodium: 132 mmol/L — ABNORMAL LOW (ref 135–145)
Total Bilirubin: 0.4 mg/dL (ref 0.3–1.2)
Total Protein: 6.9 g/dL (ref 6.5–8.1)

## 2022-10-17 LAB — CBC WITH DIFFERENTIAL (CANCER CENTER ONLY)
Abs Immature Granulocytes: 0.03 10*3/uL (ref 0.00–0.07)
Basophils Absolute: 0 10*3/uL (ref 0.0–0.1)
Basophils Relative: 1 %
Eosinophils Absolute: 0.1 10*3/uL (ref 0.0–0.5)
Eosinophils Relative: 2 %
HCT: 37 % (ref 36.0–46.0)
Hemoglobin: 11.7 g/dL — ABNORMAL LOW (ref 12.0–15.0)
Immature Granulocytes: 1 %
Lymphocytes Relative: 17 %
Lymphs Abs: 0.9 10*3/uL (ref 0.7–4.0)
MCH: 21.3 pg — ABNORMAL LOW (ref 26.0–34.0)
MCHC: 31.6 g/dL (ref 30.0–36.0)
MCV: 67.3 fL — ABNORMAL LOW (ref 80.0–100.0)
Monocytes Absolute: 0.5 10*3/uL (ref 0.1–1.0)
Monocytes Relative: 9 %
Neutro Abs: 3.9 10*3/uL (ref 1.7–7.7)
Neutrophils Relative %: 70 %
Platelet Count: 234 10*3/uL (ref 150–400)
RBC: 5.5 MIL/uL — ABNORMAL HIGH (ref 3.87–5.11)
RDW: 14.5 % (ref 11.5–15.5)
WBC Count: 5.4 10*3/uL (ref 4.0–10.5)
nRBC: 0 % (ref 0.0–0.2)

## 2022-10-17 LAB — TSH: TSH: 1.013 u[IU]/mL (ref 0.350–4.500)

## 2022-10-17 MED ORDER — SODIUM CHLORIDE 0.9 % IV SOLN: Freq: Once | INTRAVENOUS | Status: AC

## 2022-10-17 MED ORDER — SODIUM CHLORIDE 0.9 % IV SOLN
400.0000 mg | Freq: Once | INTRAVENOUS | Status: AC
  Administered 2022-10-17: 400 mg via INTRAVENOUS
  Filled 2022-10-17: qty 16

## 2022-10-17 NOTE — Patient Instructions (Signed)
[facility]  Discharge Instructions: Thank you for choosing [facility] to provide your oncology and hematology care.   If you have a lab appointment with the Cancer Center, please go directly to the Cancer Center and check in at the registration area.   Wear comfortable clothing and clothing appropriate for easy access to any Portacath or PICC line.   We strive to give you quality time with your provider. You may need to reschedule your appointment if you arrive late (15 or more minutes).  Arriving late affects you and other patients whose appointments are after yours.  Also, if you miss three or more appointments without notifying the office, you may be dismissed from the clinic at the provider's discretion.      For prescription refill requests, have your pharmacy contact our office and allow 72 hours for refills to be completed.    Today you received the following chemotherapy and/or immunotherapy agents: pembrolizumab      To help prevent nausea and vomiting after your treatment, we encourage you to take your nausea medication as directed.  BELOW ARE SYMPTOMS THAT SHOULD BE REPORTED IMMEDIATELY: *FEVER GREATER THAN 100.4 F (38 C) OR HIGHER *CHILLS OR SWEATING *NAUSEA AND VOMITING THAT IS NOT CONTROLLED WITH YOUR NAUSEA MEDICATION *UNUSUAL SHORTNESS OF BREATH *UNUSUAL BRUISING OR BLEEDING *URINARY PROBLEMS (pain or burning when urinating, or frequent urination) *BOWEL PROBLEMS (unusual diarrhea, constipation, pain near the anus) TENDERNESS IN MOUTH AND THROAT WITH OR WITHOUT PRESENCE OF ULCERS (sore throat, sores in mouth, or a toothache) UNUSUAL RASH, SWELLING OR PAIN  UNUSUAL VAGINAL DISCHARGE OR ITCHING   Items with * indicate a potential emergency and should be followed up as soon as possible or go to the Emergency Department if any problems should occur.  Please show the CHEMOTHERAPY ALERT CARD or IMMUNOTHERAPY ALERT CARD at  check-in to the Emergency Department and triage nurse.  Should you have questions after your visit or need to cancel or reschedule your appointment, please contact [facility]  Dept: [phone]  and follow the prompts.  Office hours are 8:00 a.m. to 4:30 p.m. Monday - Friday. Please note that voicemails left after 4:00 p.m. may not be returned until the following business day.  We are closed weekends and major holidays. You have access to a nurse at all times for urgent questions. Please call the main number to the clinic Dept: [phone] and follow the prompts.   For any non-urgent questions, you may also contact your provider using MyChart. We now offer e-Visits for anyone 18 and older to request care online for non-urgent symptoms. For details visit [email or web address].   Also download the MyChart app! Go to the app store, search "MyChart", open the app, select , and log in with your MyChart username and password.   

## 2022-10-17 NOTE — Assessment & Plan Note (Signed)
She has no side effects on pembrolizumab She is in agreement to have pembrolizumab switched to every 6 weeks I do not plan to repeat imaging study until next year She has follow-up with GYN oncologist in 2 months

## 2022-10-17 NOTE — Progress Notes (Signed)
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[facility]) She has no side effects on pembrolizumab She is in agreement to have pembrolizumab switched to every 6 weeks I do not plan to repeat imaging study until next year She has follow-up with GYN oncologist in 2 months  Essential hypertension She has intermittent hypertension likely due to whitecoat hypertension She will continue her current antihypertensives  Orders Placed This Encounter  Procedures   CBC with Differential (Cancer Center Only)    Standing Status:   Future    Standing Expiration Date:   11/27/2023   CMP (Cancer Center only)    Standing Status:   Future    Standing Expiration Date:   11/27/2023   T4    Standing Status:   Future    Standing Expiration Date:   11/27/2023   TSH    Standing Status:   Future    Standing Expiration Date:   11/27/2023   CBC with Differential (Cancer Center Only)    Standing Status:   Future    Standing Expiration Date:   01/15/2024   CMP (Cancer Center only)    Standing Status:   Future    Standing Expiration Date:   01/15/2024   T4    Standing Status:   Future    Standing Expiration Date:   01/15/2024   TSH    Standing Status:   Future    Standing Expiration Date:   01/15/2024    All questions were answered. The patient knows to call the clinic with any problems, questions or concerns. The total time spent in the appointment was 20 minutes encounter with patients including review of chart and various tests results, discussions about plan of care and coordination of care plan   [name], MD 10/17/2022 2:44 PM  INTERVAL HISTORY: Please see below for problem oriented charting. she returns for treatment follow-up for pembrolizumab She have  no side effects from treatment We discussed timing of her future treatment  REVIEW OF SYSTEMS:   Constitutional: Denies fevers, chills or abnormal weight loss Eyes: Denies blurriness of vision Ears, nose, mouth, throat, and face: Denies mucositis or sore throat Respiratory: Denies cough, dyspnea or wheezes Cardiovascular: Denies palpitation, chest discomfort or lower extremity swelling Gastrointestinal:  Denies nausea, heartburn or change in bowel habits Skin: Denies abnormal skin rashes Lymphatics: Denies new lymphadenopathy or easy bruising Neurological:Denies numbness, tingling or new weaknesses Behavioral/Psych: Mood is stable, no new changes  All other systems were reviewed with the patient and are negative.  I have reviewed the past medical history, past surgical history, social history and family history with the patient and they are unchanged from previous note.  ALLERGIES:  is allergic to penicillins, fentanyl, codeine, compazine [prochlorperazine], percocet [oxycodone-acetaminophen], and trazodone hcl.  MEDICATIONS:  Current Outpatient Medications  Medication Sig Dispense Refill   acetaminophen (TYLENOL) 500 MG tablet Take 500-1,000 mg by mouth every 6 (six) hours as needed (pain.).     acyclovir (ZOVIRAX) 800 MG tablet Take 800 mg by mouth 2 (two) times daily as needed (cold sores).     albuterol (VENTOLIN HFA) 108 (90 Base) MCG/ACT inhaler Inhale 2 puffs into the lungs every 6 (six) hours as needed for wheezing or shortness of breath.     amLODipine (NORVASC) 5 MG  tablet Take 5 mg by mouth every evening.     Ascorbic Acid (VITAMIN C PO) Take 500 mg by mouth in the morning.     benazepril-hydrochlorthiazide (LOTENSIN HCT) 20-25 MG tablet Take 1 tablet by mouth in the morning.     benzonatate (TESSALON) 200 MG capsule Take 200 mg by mouth 2 (two) times daily as needed for cough.     BIOTIN PO Take 1 tablet by mouth in the morning.     Cholecalciferol (VITAMIN D3 PO) Take  2,000 Units by mouth in the morning and at bedtime.     docusate sodium (COLACE) 100 MG capsule Take 100 mg by mouth 2 (two) times daily.     doxylamine, Sleep, (UNISOM) 25 MG tablet Take 12.5 mg by mouth at bedtime.     ibuprofen (ADVIL) 200 MG tablet Take 200-600 mg by mouth every 8 (eight) hours as needed (pain.).     levothyroxine (SYNTHROID, LEVOTHROID) 75 MCG tablet Take 75 mcg by mouth daily before breakfast.     MAGNESIUM PO Take 1 tablet by mouth every evening.     Multiple Vitamin (MULTIVITAMIN WITH MINERALS) TABS tablet Take 1 tablet by mouth in the morning.     Omega-3 Fatty Acids (FISH OIL PO) Take 1 capsule by mouth in the morning.     Polyethyl Glycol-Propyl Glycol (SYSTANE ULTRA) 0.4-0.3 % SOLN Place 1-2 drops into both eyes 3 (three) times daily as needed (dry/irritated eyes.).     polyethylene glycol (MIRALAX / GLYCOLAX) packet Take 11.3333 g by mouth at bedtime. 2/3 capful     simvastatin (ZOCOR) 20 MG tablet Take 20 mg by mouth at bedtime.      tacrolimus (PROTOPIC) 0.1 % ointment Apply topically 2 (two) times daily for 6 weeks. 30 g 1   TURMERIC PO Take 1 capsule by mouth every evening.     No current facility-administered medications for this visit.    SUMMARY OF ONCOLOGIC HISTORY: Oncology History Overview Note  Patient followed due to HX pulmonary nodules noted 2011  Primary cancer of left upper lobe of lung (HCC)   Staging form: Lung, AJCC 7th Edition   - Clinical stage from 09/27/2015: Stage IA (T1b, N0, M0) - Signed by Si Gaul, MD on 09/27/2015  PDL-1 90%    Primary cancer of left upper lobe of lung (HCC)  07/12/2015 Imaging   CT Chest IMPRESSION:  The solid component of the large part solid nodule in the left upper lobe measures 9 x 3 mm (mean diameter 6 mm.) persistent part solid nodules with solid components greater than or equal to 6 mm should be considered highly suspicious for pulmonary adenocarcinoma   08/01/2015 Initial Diagnosis   Primary  cancer of left upper lobe of lung (HCC)   08/01/2015 Surgery   PROCEDURE:  Left video-assisted thoracoscopy,           Wedge resection,           Thoracoscopic lingular sparing left upper lobectomy           Lymph node dissection           On-Q local anesthetic catheter placement   08/01/2015 Pathology Results   Lung, resection (segmental or lobe), Left Upper Lobe - ADENOCARCINOMA, WELL DIFFERENTIATED, SPANNING 2.5 CM. - THE SURGICAL RESECTION MARGINS ARE NEGATIVE FOR CARCINOMA. 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 She underwent a total abdominal hysterectomy and bilateral salpingo-oophorectomy, pelvic and para-aortic lymphadenectomy. She received postoperative whole pelvis radiation therapy.    02/06/2015 Surgery   In December 2016 the patient was found to have a new primary squamous cell carcinoma the vulva undergoing a modified radical vulvectomy on 02/06/2015. She underwent a modified radical vulvectomy at [facility] on 02/06/2015. She was found to have a 3 cm vulvar lesion of the posterior vulva extending into the posterior vagina. A rhomboid flap was required to close the incision.  Final pathology showed some close surgical margins. However, the specimen margins were toward the time of surgery and it was Dr Nelwyn Salisbury impression that they were widely around the primary cancer. She discussed options with the patient and they agreed to monitor her closely but not recommend any radiation therapy  to the vulva.   01/13/2017 Surgery   She experienced recurrent vulvar cancer October 2018 managed by a small modified radical vulvectomy of the right side on January 13, 2017.  Depth of invasion was 4 mm all margins were negative    03/13/2017 PET scan   1. No evidence of recurrent or metastatic disease. 2. Aortic atherosclerosis (ICD10-170.0). Coronary artery calcification.   07/20/2019 Miscellaneous   Examination of the vulva during routine surveillance on 07/20/19 showed a friable, verrucous lesion measuring approximately 2 cm was seen on the anterior wall of the suburethral vagina extending to the introitus and wrapping around to the right mid vagina. This was concerning for recurrence and was biopsied at that appointment. 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    08/11/2019 Miscellaneous   On 08/11/19 she underwent total vaginectomy. Intraoperative findings were significant for a forshortened 3cm vagina, friable 4cm plaque on the anterior vagina extending to the right fornix and left posterior vaginal wall. This necessitated a total vaginectomy to resect the lesion grossly. Her tissues were extremely friable due to age, prior radiation, menopause and lichen sclerosis. There was unavoidable fragmentation of the tissues during the resection. All gross visible disease was removed. Surgery was uncomplicated, and she reported no postop pain.  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 The suspicious foci are present at the edges of tissue fragments.  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        09/13/2019 PET scan   1. No specific evidence of recurrent vulvar carcinoma or distant metastases. There is nonspecific activity in the region of the vaginal introitus which could  reflect urine contamination or vaginal recurrence. Correlation with physical examination recommended.  2. No abnormal thoracic activity status post left upper lobe wedge resection. 3. Coronary and Aortic Atherosclerosis (ICD10-I70.0).   09/29/2019 - 10/12/2019 Radiation Therapy   The patient received adjuvant vaginal brachytherapy between 09/29/2019 through 10/12/2019.     02/28/2021 PET scan   1. Interval decrease in degree of FDG uptake associated with the vaginal introitus. The SUV max on today's study is equal to 5.96 compared with 9.17 previously. 2.  No highly concerning features to suggest nodal metastasis or distant metastatic disease. Mildly increased tracer uptake within and morphologically benign left inguinal lymph node is identified which warrants attention on follow-up imaging. 3. Stable postoperative changes within the left upper lobe without signs to suggest locally recurrent tumor. 4. Aortic Atherosclerosis (ICD10-I70.0). Coronary artery calcifications.   03/11/2021 Initial Diagnosis   Recurrent vaginal cancer   03/20/2021 Procedure   03/20/21: Vaginectomy, CO2 laser of the vulva for recurrent vaginal cancer, high-grade vulvar dysplasia.  Vaginal excision showed squamous mucosa with ulceration and inflammation.  No malignancy or dysplasia identified.  Rare atypical "radiation" fibroblast embedded.    10/24/2021 Imaging   1. No acute abnormality of the abdomen or pelvis. 2. Aortic Atherosclerosis (ICD10-I70.0).   02/19/2022 Surgery   Preop Diagnosis: history of vulvar and vaginal cancer, suspicion for recurrent vulvar cancer   Postoperative Diagnosis: same as above   Surgery: Partial simple right/posterior vulvectomy with rotational rhomboid flap    Surgeons: Carin Hock MD    Pathology: posterior vulva (6-8 o'c) with stitch at 12, additional superior, lateral and inferior margins, deep margin   Operative findings: 2 x 2.5 cm verrucous appearing lesion along the perineum  and extending to the introitus, relatively broad 2 cm base. Given size of defect and its location, required rotational flap to close defect. Hyperkeratotic areas within the significantly foreshortened vagina.   03/13/2022 PET scan    ADDENDUM REPORT: 03/14/2022 08:44   ADDENDUM: I just discussed this study with Dr. Pricilla Holm. The patient had surgery about 3 weeks ago for recurrent disease at the vaginal introitus with no residual disease apparent clinically. Additionally, the patient has had no chemotherapy in the 1 year interval since the prior study and no radiation therapy to the left groin region. In light of this additional history, the changes in the region the vaginal introitus today may well simply represent urinary contaminant rather than residual disease. Further, given lack of interval systemic chemotherapy and no interval focused therapy to the left groin lymph node, the interval stability in this node (9 mm previously versus 10 mm today) and stable low level FDG accumulation ( SUV max = 2.9 today compared to 3.0 previously) would suggest a reactive etiology as more likely, especially in light of the recurrent surgeries to the external genitalia.     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  Approximately 1 x 1 cm raised mass along the right distal vagina from 6-8 o'clock.  Second raised mass along the left vaginal sidewall extending almost up to the vaginal apex measuring approximately 1 x 1.5 cm.  Several small, less than 5 mm, masses along  the vaginal apex and superior wall of the posterior vagina.  Well-healed lumbar incision from recent surgery.  Significant loss of architecture of the vulva with erythema noted of bilateral anterior labia and along the perineum.  Multiple biopsies taken including 2 and 10:00 along the vulva and at 5:00 along the posterior vulva.     05/07/2022 Pathology Results   CASE: [id number] [name] Surgical Pathology Report  Clinical History:  Recurrent vulvar cancer (crm)  FINAL MICROSCOPIC DIAGNOSIS:  A. VAGINAL LESION, DISTAL RIGHT, EXCISION:      Invasive squamous cell carcinoma.      Cauterized carcinoma present at the mucosal margin. Deep margin are negative for carcinoma.  B. VAGINAL SIDEWALL, LEFT, EXCISION:      Invasive squamous cell carcinoma.      Cauterized carcinoma present at the mucosal margin. Deep margin are negative for carcinoma.  C. VAGINA, APEX, EXCISION:      At least squamous cell carcinoma in situ.  D. VAGINA, POSTERIOR, BIOPSY:      At least squamous cell carcinoma in situ.  E. VULVA, RIGHT, BIOPSY:      Squamous mucosa with acute inflammation and reactive epithelial changes.      Negative for dysplasia or malignancy.  F. VULVA, LEFT, BIOPSY:      Squamous mucosa with acute and chronic inflammation and reactive epithelial changes.      Negative for dysplasia or malignancy.  G. VULVA, POSTERIOR, 5:00, BIOPSY:      Squamous mucosa with acute and chronic inflammation and reactive epithelial changes.      Negative for dysplasia or malignancy.     06/03/2022 Cancer Staging   Staging form: Vagina, AJCC 8th Edition - Clinical: cT1, cN0, cM0 - Signed by [name], MD on 06/03/2022   06/09/2022 - 06/09/2022 Chemotherapy   Patient is on Treatment Plan : UTERINE Pembrolizumab (200) q21d     06/09/2022 - 06/09/2022 Chemotherapy   Patient is on Treatment Plan : UTERINE Carboplatin (AUC 5) q21d     06/30/2022 -  Chemotherapy   Patient is on Treatment  Plan : UTERINE Pembrolizumab (400) q42d     09/26/2022 PET scan   NM PET Image Restage (PS) Skull Base to Thigh (F-18 FDG)  Result Date: 09/25/2022 CLINICAL DATA:  Subsequent treatment strategy for recurrent vaginal cancer. Currently on immunotherapy. Biopsy 05/07/2022. EXAM: NUCLEAR MEDICINE PET SKULL BASE TO THIGH TECHNIQUE: 7.7 mCi F-18 FDG was injected intravenously. Full-ring PET imaging was performed from the skull base to thigh after the radiotracer. CT data was obtained and used for attenuation correction and anatomic localization. Fasting blood glucose: 107 mg/dl COMPARISON:  16/11/9602 FINDINGS: Mediastinal blood pool activity: SUV max 2.2 Liver activity: SUV max NA NECK: No areas of abnormal hypermetabolism. Incidental CT findings: Cerebral atrophy. No cervical adenopathy. Bilateral carotid atherosclerosis. CHEST: No pulmonary parenchymal or thoracic nodal hypermetabolism. Incidental CT findings: Surgical changes about the left hilum and left upper lobe consistent with clinical history of prior wedge resection. Aortic and coronary artery calcification. Esophageal fluid level on 60/4. 3 mm right lower lobe pulmonary nodule on 78/4 is unchanged. ABDOMEN/PELVIS: No abdominopelvic parenchymal hypermetabolism. Abdominopelvic nodal dissection. Mildly hypermetabolic left inguinal nodes. The more cephalad measures 8 mm and a S.U.V. max of 2.6 on 168/4 versus 6 mm and a S.U.V. max of 1.8 on the prior. More caudal measures 1.0 cm and a S.U.V. max of 3.7 on 173/4 versus similar in size and a S.U.V. max of 2.9 on the prior. Incidental CT findings: Scarred upper pole right kidney. Normal adrenal glands. Abdominal aortic atherosclerosis. Hysterectomy. Pelvic floor laxity with mild cystocele. SKELETON: No abnormal marrow activity. Incidental CT findings: Right-greater-than-left hip osteoarthritis. IMPRESSION: 1. Left inguinal nodes which demonstrate mild increase in hypermetabolism. 1 has minimally enlarged. These  are again favored to be reactive but warrant follow-up attention. 2. Otherwise, no evidence of recurrent or metastatic disease, status post hysterectomy and abdominopelvic node dissection. 3. Incidental  findings, including: Coronary artery atherosclerosis. Aortic Atherosclerosis (ICD10-I70.0). Esophageal air fluid level suggests dysmotility or gastroesophageal reflux. Electronically Signed   By: Jeronimo Greaves M.D.   On: 09/25/2022 14:57        PHYSICAL EXAMINATION: ECOG PERFORMANCE STATUS: 0 - Asymptomatic  Vitals:   10/17/22 1146  BP: (!) 153/61  Pulse: 63  Resp: 18  Temp: 98.2 F (36.8 C)  SpO2: 100%   Filed Weights   10/17/22 1146  Weight: 159 lb 3.2 oz (72.2 kg)    GENERAL:alert, no distress and comfortable NEURO: alert & oriented x 3 with fluent speech, no focal motor/sensory deficits  LABORATORY DATA:  I have reviewed the data as listed    Component Value Date/Time   NA 132 (L) 10/17/2022 1122   NA 136 09/18/2016 1536   K 3.6 10/17/2022 1122   K 3.9 09/18/2016 1536   CL 96 (L) 10/17/2022 1122   CO2 29 10/17/2022 1122   CO2 26 09/18/2016 1536   GLUCOSE 125 (H) 10/17/2022 1122   GLUCOSE 172 (H) 09/18/2016 1536   BUN 18 10/17/2022 1122   BUN 15.8 09/18/2016 1536   CREATININE 0.61 10/17/2022 1122   CREATININE 0.8 09/18/2016 1536   CALCIUM 9.5 10/17/2022 1122   CALCIUM 9.3 09/18/2016 1536   PROT 6.9 10/17/2022 1122   PROT 6.3 (L) 09/18/2016 1536   ALBUMIN 4.2 10/17/2022 1122   ALBUMIN 3.6 09/18/2016 1536   AST 15 10/17/2022 1122   AST 18 09/18/2016 1536   ALT 15 10/17/2022 1122   ALT 17 09/18/2016 1536   ALKPHOS 92 10/17/2022 1122   ALKPHOS 91 09/18/2016 1536   BILITOT 0.4 10/17/2022 1122   BILITOT 0.42 09/18/2016 1536   GFRNONAA >60 10/17/2022 1122   GFRAA >60 08/04/2019 0901   GFRAA >60 09/13/2018 0808    No results found for: "SPEP", "UPEP"  Lab Results  Component Value Date   WBC 5.4 10/17/2022   NEUTROABS 3.9 10/17/2022   HGB 11.7 (L) 10/17/2022    HCT 37.0 10/17/2022   MCV 67.3 (L) 10/17/2022   PLT 234 10/17/2022      Chemistry      Component Value Date/Time   NA 132 (L) 10/17/2022 1122   NA 136 09/18/2016 1536   K 3.6 10/17/2022 1122   K 3.9 09/18/2016 1536   CL 96 (L) 10/17/2022 1122   CO2 29 10/17/2022 1122   CO2 26 09/18/2016 1536   BUN 18 10/17/2022 1122   BUN 15.8 09/18/2016 1536   CREATININE 0.61 10/17/2022 1122   CREATININE 0.8 09/18/2016 1536      Component Value Date/Time   CALCIUM 9.5 10/17/2022 1122   CALCIUM 9.3 09/18/2016 1536   ALKPHOS 92 10/17/2022 1122   ALKPHOS 91 09/18/2016 1536   AST 15 10/17/2022 1122   AST 18 09/18/2016 1536   ALT 15 10/17/2022 1122   ALT 17 09/18/2016 1536   BILITOT 0.4 10/17/2022 1122   BILITOT 0.42 09/18/2016 1536       RADIOGRAPHIC STUDIES: I have personally reviewed the radiological images as listed and agreed with the findings in the report. NM PET Image Restage (PS) Skull Base to Thigh (F-18 FDG)  Result Date: 09/25/2022 CLINICAL DATA:  Subsequent treatment strategy for recurrent vaginal cancer. Currently on immunotherapy. Biopsy 05/07/2022. EXAM: NUCLEAR MEDICINE PET SKULL BASE TO THIGH TECHNIQUE: 7.7 mCi F-18 FDG was injected intravenously. Full-ring PET imaging was performed from the skull base to thigh after the radiotracer. CT data was obtained and used  for attenuation correction and anatomic localization. Fasting blood glucose: 107 mg/dl COMPARISON:  40/98/1191 FINDINGS: Mediastinal blood pool activity: SUV max 2.2 Liver activity: SUV max NA NECK: No areas of abnormal hypermetabolism. Incidental CT findings: Cerebral atrophy. No cervical adenopathy. Bilateral carotid atherosclerosis. CHEST: No pulmonary parenchymal or thoracic nodal hypermetabolism. Incidental CT findings: Surgical changes about the left hilum and left upper lobe consistent with clinical history of prior wedge resection. Aortic and coronary artery calcification. Esophageal fluid level on 60/4. 3 mm  right lower lobe pulmonary nodule on 78/4 is unchanged. ABDOMEN/PELVIS: No abdominopelvic parenchymal hypermetabolism. Abdominopelvic nodal dissection. Mildly hypermetabolic left inguinal nodes. The more cephalad measures 8 mm and a S.U.V. max of 2.6 on 168/4 versus 6 mm and a S.U.V. max of 1.8 on the prior. More caudal measures 1.0 cm and a S.U.V. max of 3.7 on 173/4 versus similar in size and a S.U.V. max of 2.9 on the prior. Incidental CT findings: Scarred upper pole right kidney. Normal adrenal glands. Abdominal aortic atherosclerosis. Hysterectomy. Pelvic floor laxity with mild cystocele. SKELETON: No abnormal marrow activity. Incidental CT findings: Right-greater-than-left hip osteoarthritis. IMPRESSION: 1. Left inguinal nodes which demonstrate mild increase in hypermetabolism. 1 has minimally enlarged. These are again favored to be reactive but warrant follow-up attention. 2. Otherwise, no evidence of recurrent or metastatic disease, status post hysterectomy and abdominopelvic node dissection. 3. Incidental findings, including: Coronary artery atherosclerosis. Aortic Atherosclerosis (ICD10-I70.0). Esophageal air fluid level suggests dysmotility or gastroesophageal reflux. Electronically Signed   By: Jeronimo Greaves M.D.   On: 09/25/2022 14:57

## 2022-10-17 NOTE — Assessment & Plan Note (Signed)
She has intermittent hypertension likely due to whitecoat hypertension She will continue her current antihypertensives

## 2022-10-18 ENCOUNTER — Other Ambulatory Visit: Payer: Self-pay

## 2022-10-19 LAB — T4: T4, Total: 11.9 ug/dL (ref 4.5–12.0)

## 2022-10-24 ENCOUNTER — Other Ambulatory Visit: Payer: Self-pay

## 2022-10-27 ENCOUNTER — Telehealth: Payer: Self-pay

## 2022-10-27 ENCOUNTER — Other Ambulatory Visit (HOSPITAL_COMMUNITY): Payer: Self-pay

## 2022-10-27 DIAGNOSIS — L900 Lichen sclerosus et atrophicus: Secondary | ICD-10-CM

## 2022-10-27 DIAGNOSIS — C519 Malignant neoplasm of vulva, unspecified: Secondary | ICD-10-CM

## 2022-10-27 MED ORDER — TACROLIMUS 0.1 % EX OINT
TOPICAL_OINTMENT | Freq: Two times a day (BID) | CUTANEOUS | 1 refills | Status: DC
  Filled 2022-10-27: qty 30, 30d supply, fill #0

## 2022-10-27 NOTE — Telephone Encounter (Signed)
Pt called stating she is leaving to go out of the country on Wednesday 9/18. She needs a refill on the Tacrolimus 0.1% ointment for the [facility] sent to the [employer]

## 2022-10-28 ENCOUNTER — Other Ambulatory Visit (HOSPITAL_COMMUNITY): Payer: Self-pay

## 2022-10-28 ENCOUNTER — Encounter: Payer: Self-pay | Admitting: Hematology and Oncology

## 2022-10-28 NOTE — Telephone Encounter (Signed)
Spoke with [name] Richards and relayed message that her refill Rx. For Tacrolimus has been sent to [facility]. And Dr. Pricilla Holm recommends when she gets back from her trip to take a couple weeks off from the ointment and see how symptoms are. Pt verbalizes understanding and states she has only been using the tacrolimus once a day as opposed to twice a day, and when she is not having any itching she doesn't use the ointment. Patient will report on her symptoms at her next scheduled appointment in October with Dr. Pricilla Holm.

## 2022-11-18 DIAGNOSIS — M1712 Unilateral primary osteoarthritis, left knee: Secondary | ICD-10-CM | POA: Diagnosis not present

## 2022-11-27 ENCOUNTER — Encounter: Payer: Self-pay | Admitting: Hematology and Oncology

## 2022-11-27 ENCOUNTER — Inpatient Hospital Stay (HOSPITAL_BASED_OUTPATIENT_CLINIC_OR_DEPARTMENT_OTHER): Payer: Medicare Other | Admitting: Hematology and Oncology

## 2022-11-27 ENCOUNTER — Inpatient Hospital Stay: Payer: Medicare Other | Attending: Gynecologic Oncology

## 2022-11-27 ENCOUNTER — Inpatient Hospital Stay: Payer: Medicare Other

## 2022-11-27 VITALS — BP 165/67 | HR 74 | Temp 97.8°F | Resp 18 | Ht 65.0 in | Wt 154.8 lb

## 2022-11-27 DIAGNOSIS — K219 Gastro-esophageal reflux disease without esophagitis: Secondary | ICD-10-CM

## 2022-11-27 DIAGNOSIS — N9089 Other specified noninflammatory disorders of vulva and perineum: Secondary | ICD-10-CM | POA: Diagnosis not present

## 2022-11-27 DIAGNOSIS — Z79899 Other long term (current) drug therapy: Secondary | ICD-10-CM | POA: Insufficient documentation

## 2022-11-27 DIAGNOSIS — N904 Leukoplakia of vulva: Secondary | ICD-10-CM | POA: Insufficient documentation

## 2022-11-27 DIAGNOSIS — C52 Malignant neoplasm of vagina: Secondary | ICD-10-CM

## 2022-11-27 DIAGNOSIS — Z8542 Personal history of malignant neoplasm of other parts of uterus: Secondary | ICD-10-CM | POA: Insufficient documentation

## 2022-11-27 DIAGNOSIS — Z90722 Acquired absence of ovaries, bilateral: Secondary | ICD-10-CM | POA: Insufficient documentation

## 2022-11-27 DIAGNOSIS — Z90710 Acquired absence of both cervix and uterus: Secondary | ICD-10-CM | POA: Insufficient documentation

## 2022-11-27 DIAGNOSIS — Z923 Personal history of irradiation: Secondary | ICD-10-CM | POA: Diagnosis not present

## 2022-11-27 DIAGNOSIS — Z5112 Encounter for antineoplastic immunotherapy: Secondary | ICD-10-CM | POA: Diagnosis not present

## 2022-11-27 DIAGNOSIS — Z8544 Personal history of malignant neoplasm of other female genital organs: Secondary | ICD-10-CM | POA: Diagnosis not present

## 2022-11-27 DIAGNOSIS — L439 Lichen planus, unspecified: Secondary | ICD-10-CM | POA: Diagnosis not present

## 2022-11-27 DIAGNOSIS — Z9079 Acquired absence of other genital organ(s): Secondary | ICD-10-CM | POA: Diagnosis not present

## 2022-11-27 LAB — CMP (CANCER CENTER ONLY)
ALT: 14 U/L (ref 0–44)
AST: 16 U/L (ref 15–41)
Albumin: 4.3 g/dL (ref 3.5–5.0)
Alkaline Phosphatase: 90 U/L (ref 38–126)
Anion gap: 10 (ref 5–15)
BUN: 15 mg/dL (ref 8–23)
CO2: 26 mmol/L (ref 22–32)
Calcium: 9.5 mg/dL (ref 8.9–10.3)
Chloride: 96 mmol/L — ABNORMAL LOW (ref 98–111)
Creatinine: 0.66 mg/dL (ref 0.44–1.00)
GFR, Estimated: >60 mL/min (ref >60)
Glucose, Bld: 187 mg/dL — ABNORMAL HIGH (ref 70–99)
Potassium: 3.5 mmol/L (ref 3.5–5.1)
Sodium: 132 mmol/L — ABNORMAL LOW (ref 135–145)
Total Bilirubin: 0.4 mg/dL (ref 0.3–1.2)
Total Protein: 7.1 g/dL (ref 6.5–8.1)

## 2022-11-27 LAB — CBC WITH DIFFERENTIAL (CANCER CENTER ONLY)
Abs Immature Granulocytes: 0.03 10*3/uL (ref 0.00–0.07)
Basophils Absolute: 0 10*3/uL (ref 0.0–0.1)
Basophils Relative: 1 %
Eosinophils Absolute: 0 10*3/uL (ref 0.0–0.5)
Eosinophils Relative: 1 %
HCT: 36.8 % (ref 36.0–46.0)
Hemoglobin: 12 g/dL (ref 12.0–15.0)
Immature Granulocytes: 1 %
Lymphocytes Relative: 16 %
Lymphs Abs: 0.9 10*3/uL (ref 0.7–4.0)
MCH: 21.8 pg — ABNORMAL LOW (ref 26.0–34.0)
MCHC: 32.6 g/dL (ref 30.0–36.0)
MCV: 66.8 fL — ABNORMAL LOW (ref 80.0–100.0)
Monocytes Absolute: 0.4 10*3/uL (ref 0.1–1.0)
Monocytes Relative: 7 %
Neutro Abs: 4.4 10*3/uL (ref 1.7–7.7)
Neutrophils Relative %: 74 %
Platelet Count: 256 10*3/uL (ref 150–400)
RBC: 5.51 MIL/uL — ABNORMAL HIGH (ref 3.87–5.11)
RDW: 14.9 % (ref 11.5–15.5)
WBC Count: 5.8 10*3/uL (ref 4.0–10.5)
nRBC: 0 % (ref 0.0–0.2)

## 2022-11-27 LAB — TSH: TSH: 1.118 u[IU]/mL (ref 0.350–4.500)

## 2022-11-27 MED ORDER — SODIUM CHLORIDE 0.9 % IV SOLN: Freq: Once | INTRAVENOUS | Status: AC

## 2022-11-27 MED ORDER — SODIUM CHLORIDE 0.9 % IV SOLN
400.0000 mg | Freq: Once | INTRAVENOUS | Status: AC
  Administered 2022-11-27: 400 mg via INTRAVENOUS
  Filled 2022-11-27: qty 16

## 2022-11-27 NOTE — Progress Notes (Signed)
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   All questions were answered. The patient knows to call the clinic with any problems, questions or concerns. The total time spent in the appointment was 20 minutes encounter with patients including review of chart and various tests results, discussions about plan of care and coordination of care plan   [name], MD 11/27/2022 1:48 PM  INTERVAL HISTORY: Please see below for problem oriented charting. she returns for treatment today She denies any new symptoms except for last night where she woke up in the middle the night with what sounds like acid reflux She denies any abnormal vaginal symptoms She has appointment coming up to see GYN surgeon for pelvic exam  REVIEW OF SYSTEMS:   Constitutional: Denies fevers, chills or abnormal weight loss Eyes: Denies blurriness of vision Ears, nose, mouth, throat, and face: Denies mucositis or sore throat Respiratory: Denies cough, dyspnea or wheezes Cardiovascular: Denies palpitation, chest discomfort or lower extremity swelling Skin: Denies abnormal skin rashes Lymphatics: Denies new lymphadenopathy or easy bruising Neurological:Denies numbness, tingling or new weaknesses Behavioral/Psych: Mood is stable, no new changes  All other  systems were reviewed with the patient and are negative.  I have reviewed the past medical history, past surgical history, social history and family history with the patient and they are unchanged from previous note.  ALLERGIES:  is allergic to penicillins, fentanyl, codeine, compazine [prochlorperazine], percocet [oxycodone-acetaminophen], and trazodone hcl.  MEDICATIONS:  Current Outpatient Medications  Medication Sig Dispense Refill   acetaminophen (TYLENOL) 500 MG tablet Take 500-1,000 mg by mouth every 6 (six) hours as needed (pain.).     acyclovir (ZOVIRAX) 800 MG tablet Take 800 mg by mouth 2 (two) times daily as needed (cold sores).     albuterol (VENTOLIN HFA) 108 (90 Base) MCG/ACT inhaler Inhale 2 puffs into the lungs every 6 (six) hours as needed for wheezing or shortness of breath.     amLODipine (NORVASC) 5 MG tablet Take 5 mg by mouth every evening.     Ascorbic Acid (VITAMIN C PO) Take 500 mg by mouth in the morning.     benazepril-hydrochlorthiazide (LOTENSIN HCT) 20-25 MG tablet Take 1 tablet by mouth in the morning.     benzonatate (TESSALON) 200 MG capsule Take 200 mg by mouth 2 (two) times daily as needed for cough.     BIOTIN PO Take 1 tablet by mouth in the morning.     Cholecalciferol (VITAMIN D3 PO) Take 2,000 Units by mouth in the morning and at bedtime.     docusate sodium (COLACE) 100 MG capsule Take 100 mg by mouth 2 (two) times daily.     doxylamine, Sleep, (UNISOM) 25 MG tablet Take 12.5 mg by mouth at bedtime.     levothyroxine (SYNTHROID, LEVOTHROID) 75 MCG tablet Take 75 mcg by mouth daily before breakfast.  MAGNESIUM PO Take 1 tablet by mouth every evening.     Multiple Vitamin (MULTIVITAMIN WITH MINERALS) TABS tablet Take 1 tablet by mouth in the morning.     Omega-3 Fatty Acids (FISH OIL PO) Take 1 capsule by mouth in the morning.     Polyethyl Glycol-Propyl Glycol (SYSTANE ULTRA) 0.4-0.3 % SOLN Place 1-2 drops into both eyes 3 (three) times daily as  needed (dry/irritated eyes.).     polyethylene glycol (MIRALAX / GLYCOLAX) packet Take 11.3333 g by mouth at bedtime. 2/3 capful     simvastatin (ZOCOR) 20 MG tablet Take 20 mg by mouth at bedtime.      tacrolimus (PROTOPIC) 0.1 % ointment Apply topically to the vulva twice daily (per Dr. Pricilla Holm) 30 g 1   TURMERIC PO Take 1 capsule by mouth every evening.     No current facility-administered medications for this visit.   Facility-Administered Medications Ordered in Other Visits  Medication Dose Route Frequency Provider Last Rate Last Admin   pembrolizumab (KEYTRUDA) 400 mg in sodium chloride 0.9 % 50 mL chemo infusion  400 mg Intravenous Once [name], MD 132 mL/hr at 11/27/22 1324 400 mg at 11/27/22 1324    SUMMARY OF ONCOLOGIC HISTORY: Oncology History Overview Note  Patient followed due to HX pulmonary nodules noted 2011  Primary cancer of left upper lobe of lung (HCC)   Staging form: Lung, AJCC 7th Edition   - Clinical stage from 09/27/2015: Stage IA (T1b, N0, M0) - Signed by Si Gaul, MD on 09/27/2015  PDL-1 90%    Primary cancer of left upper lobe of lung (HCC)  07/12/2015 Imaging   CT Chest IMPRESSION:  The solid component of the large part solid nodule in the left upper lobe measures 9 x 3 mm (mean diameter 6 mm.) persistent part solid nodules with solid components greater than or equal to 6 mm should be considered highly suspicious for pulmonary adenocarcinoma   08/01/2015 Initial Diagnosis   Primary cancer of left upper lobe of lung (HCC)   08/01/2015 Surgery   PROCEDURE:  Left video-assisted thoracoscopy,           Wedge resection,           Thoracoscopic lingular sparing left upper lobectomy           Lymph node dissection           On-Q local anesthetic catheter placement   08/01/2015 Pathology Results   Lung, resection (segmental or lobe), Left Upper Lobe - ADENOCARCINOMA, WELL DIFFERENTIATED, SPANNING 2.5 CM. - THE SURGICAL RESECTION MARGINS ARE NEGATIVE  FOR CARCINOMA. 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 She underwent a total abdominal hysterectomy and bilateral salpingo-oophorectomy, pelvic and para-aortic lymphadenectomy. She received postoperative whole pelvis radiation therapy.    02/06/2015 Surgery   In December 2016 the patient was found to have a new primary squamous cell carcinoma the vulva undergoing a modified radical vulvectomy on 02/06/2015. She underwent a modified radical vulvectomy at [facility] on 02/06/2015.  She was found to have a 3 cm vulvar lesion of the posterior vulva extending into the posterior vagina. A rhomboid flap was required to close the incision.  Final pathology showed some close surgical margins. However, the specimen margins were toward the time of surgery and it was Dr Nelwyn Salisbury impression that they were widely around the primary cancer. She discussed options with the patient and they agreed to monitor her closely but not recommend any radiation therapy to the vulva.   01/13/2017 Surgery   She experienced recurrent vulvar cancer October 2018 managed by a small modified radical vulvectomy of the right side on January 13, 2017.  Depth of invasion was 4 mm all margins were negative    03/13/2017 PET scan   1. No evidence of recurrent or metastatic disease. 2. Aortic atherosclerosis (ICD10-170.0). Coronary artery calcification.   07/20/2019 Miscellaneous   Examination of the vulva during routine surveillance on 07/20/19  showed a friable, verrucous lesion measuring approximately 2 cm was seen on the anterior wall of the suburethral vagina extending to the introitus and wrapping around to the right mid vagina. This was concerning for recurrence and was biopsied at that appointment. 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    08/11/2019 Miscellaneous   On 08/11/19 she underwent total vaginectomy. Intraoperative findings were significant for a forshortened 3cm vagina, friable 4cm plaque on the anterior vagina extending to the right fornix and left posterior vaginal wall. This necessitated a total vaginectomy to resect the lesion grossly. Her tissues were extremely friable due to age, prior radiation, menopause and lichen sclerosis. There was unavoidable fragmentation of the tissues during the resection. All gross visible disease was removed. Surgery was uncomplicated, and she reported no postop pain.  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 The suspicious foci are present at the edges of tissue fragments.  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        09/13/2019 PET scan   1. No specific evidence of recurrent vulvar carcinoma or distant metastases. There is nonspecific activity in the region of the vaginal introitus which could reflect urine contamination or vaginal recurrence. Correlation with physical examination recommended.  2. No abnormal thoracic activity status post left upper lobe wedge resection. 3. Coronary and Aortic Atherosclerosis (ICD10-I70.0).   09/29/2019 - 10/12/2019 Radiation Therapy   The patient received adjuvant vaginal brachytherapy between 09/29/2019 through 10/12/2019.     02/28/2021 PET scan   1. Interval decrease in degree of FDG uptake associated with the vaginal  introitus. The SUV max on today's study is equal to 5.96 compared with 9.17 previously. 2. No highly concerning features to suggest nodal metastasis or distant metastatic disease. Mildly increased tracer uptake within and morphologically benign left inguinal lymph node is identified which warrants attention on follow-up imaging. 3. Stable postoperative changes within the left upper lobe without signs to suggest locally recurrent tumor. 4. Aortic Atherosclerosis (ICD10-I70.0). Coronary artery calcifications.   03/11/2021 Initial Diagnosis   Recurrent vaginal cancer   03/20/2021 Procedure   03/20/21: Vaginectomy, CO2 laser of the vulva for recurrent vaginal cancer, high-grade vulvar dysplasia.  Vaginal excision showed squamous mucosa with ulceration and inflammation.  No malignancy or dysplasia identified.  Rare atypical "radiation" fibroblast embedded.    10/24/2021 Imaging   1. No acute abnormality of the abdomen or pelvis. 2.  Aortic Atherosclerosis (ICD10-I70.0).   02/19/2022 Surgery   Preop Diagnosis: history of vulvar and vaginal cancer, suspicion for recurrent vulvar cancer   Postoperative Diagnosis: same as above   Surgery: Partial simple right/posterior vulvectomy with rotational rhomboid flap    Surgeons: Carin Hock MD    Pathology: posterior vulva (6-8 o'c) with stitch at 12, additional superior, lateral and inferior margins, deep margin   Operative findings: 2 x 2.5 cm verrucous appearing lesion along the perineum and extending to the introitus, relatively broad 2 cm base. Given size of defect and its location, required rotational flap to close defect. Hyperkeratotic areas within the significantly foreshortened vagina.   03/13/2022 PET scan    ADDENDUM REPORT: 03/14/2022 08:44   ADDENDUM: I just discussed this study with Dr. Pricilla Holm. The patient had surgery about 3 weeks ago for recurrent disease at the vaginal introitus with no residual disease apparent clinically.  Additionally, the patient has had no chemotherapy in the 1 year interval since the prior study and no radiation therapy to the left groin region. In light of this additional history, the changes in the region the vaginal introitus today may well simply represent urinary contaminant rather than residual disease. Further, given lack of interval systemic chemotherapy and no interval focused therapy to the left groin lymph node, the interval stability in this node (9 mm previously versus 10 mm today) and stable low level FDG accumulation ( SUV max = 2.9 today compared to 3.0 previously) would suggest a reactive etiology as more likely, especially in light of the recurrent surgeries to the external genitalia.     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  Approximately 1 x 1 cm raised mass along the right distal vagina from 6-8 o'clock.  Second raised mass along the left vaginal sidewall extending almost up to the vaginal apex measuring approximately 1 x 1.5 cm.  Several small, less than 5 mm, masses along the vaginal apex and superior wall of the posterior vagina.  Well-healed lumbar incision from recent surgery.  Significant loss of architecture of the vulva with erythema noted of bilateral anterior labia and along the perineum.  Multiple biopsies taken including 2 and 10:00 along the vulva and at 5:00 along the posterior vulva.     05/07/2022 Pathology Results   CASE: [id number] [name] Surgical Pathology Report  Clinical History:  Recurrent vulvar cancer (crm)  FINAL MICROSCOPIC DIAGNOSIS:  A. VAGINAL LESION, DISTAL RIGHT, EXCISION:      Invasive squamous cell carcinoma.      Cauterized carcinoma present at the mucosal margin. Deep margin are negative for carcinoma.  B. VAGINAL SIDEWALL, LEFT, EXCISION:      Invasive squamous cell carcinoma.      Cauterized carcinoma present at the mucosal margin. Deep margin are negative for carcinoma.  C. VAGINA, APEX, EXCISION:      At least squamous cell carcinoma in situ.  D. VAGINA, POSTERIOR, BIOPSY:      At least squamous cell carcinoma in situ.  E. VULVA, RIGHT, BIOPSY:      Squamous mucosa with acute inflammation  and reactive epithelial changes.      Negative for dysplasia or malignancy.  F. VULVA, LEFT, BIOPSY:      Squamous mucosa with acute and chronic inflammation and reactive epithelial changes.      Negative for dysplasia or malignancy.  G. VULVA, POSTERIOR, 5:00, BIOPSY:      Squamous mucosa with acute and chronic inflammation and reactive epithelial changes.      Negative for dysplasia or malignancy.     06/03/2022 Cancer Staging   Staging form: Vagina, AJCC 8th Edition - Clinical: cT1, cN0, cM0 - Signed by [name], MD on 06/03/2022   06/09/2022 - 06/09/2022 Chemotherapy   Patient is on Treatment Plan : UTERINE Pembrolizumab (200) q21d     06/09/2022 - 06/09/2022 Chemotherapy   Patient is on Treatment Plan : UTERINE Carboplatin (AUC 5) q21d     06/30/2022 -  Chemotherapy   Patient is on Treatment Plan : UTERINE Pembrolizumab (400) q42d     09/26/2022 PET scan   NM PET Image Restage (PS) Skull Base to Thigh (F-18 FDG)  Result Date: 09/25/2022 CLINICAL DATA:  Subsequent treatment strategy for recurrent vaginal cancer. Currently on immunotherapy. Biopsy 05/07/2022. EXAM: NUCLEAR MEDICINE PET SKULL BASE TO THIGH TECHNIQUE: 7.7 mCi F-18 FDG was injected intravenously. Full-ring PET imaging was performed from the skull base to thigh after the  radiotracer. CT data was obtained and used for attenuation correction and anatomic localization. Fasting blood glucose: 107 mg/dl COMPARISON:  41/32/4401 FINDINGS: Mediastinal blood pool activity: SUV max 2.2 Liver activity: SUV max NA NECK: No areas of abnormal hypermetabolism. Incidental CT findings: Cerebral atrophy. No cervical adenopathy. Bilateral carotid atherosclerosis. CHEST: No pulmonary parenchymal or thoracic nodal hypermetabolism. Incidental CT findings: Surgical changes about the left hilum and left upper lobe consistent with clinical history of prior wedge resection. Aortic and coronary artery calcification. Esophageal fluid level on 60/4. 3 mm right lower lobe pulmonary nodule on 78/4 is unchanged. ABDOMEN/PELVIS: No abdominopelvic parenchymal hypermetabolism. Abdominopelvic nodal dissection. Mildly hypermetabolic left inguinal nodes. The more cephalad measures 8 mm and a S.U.V. max of 2.6 on 168/4 versus 6 mm and a S.U.V. max of 1.8 on the prior. More caudal measures 1.0 cm and a S.U.V. max of 3.7 on 173/4 versus similar in size and a S.U.V. max of 2.9 on the prior. Incidental CT findings: Scarred upper pole right kidney. Normal adrenal glands. Abdominal aortic atherosclerosis. Hysterectomy. Pelvic floor laxity with mild cystocele. SKELETON: No abnormal marrow activity. Incidental CT findings: Right-greater-than-left hip osteoarthritis. IMPRESSION: 1. Left inguinal nodes which demonstrate mild increase in hypermetabolism. 1 has minimally enlarged. These are again favored to be reactive but warrant follow-up attention. 2. Otherwise, no evidence of recurrent or metastatic disease, status post hysterectomy and abdominopelvic node dissection. 3. Incidental findings, including: Coronary artery atherosclerosis. Aortic Atherosclerosis (ICD10-I70.0). Esophageal air fluid level suggests dysmotility or gastroesophageal reflux. Electronically Signed   By: Jeronimo Greaves M.D.   On: 09/25/2022 14:57         PHYSICAL EXAMINATION: ECOG PERFORMANCE STATUS: 1 - Symptomatic but completely ambulatory  Vitals:   11/27/22 1151  BP: (!) 165/67  Pulse: 74  Resp: 18  Temp: 97.8 F (36.6 C)  SpO2: 98%   Filed Weights   11/27/22 1151  Weight: 154 lb 12.8 oz (70.2 kg)    GENERAL:alert, no distress and comfortable   LABORATORY DATA:  I have reviewed the data as listed    Component Value Date/Time   NA 132 (L) 11/27/2022 1140  NA 136 09/18/2016 1536   K 3.5 11/27/2022 1140   K 3.9 09/18/2016 1536   CL 96 (L) 11/27/2022 1140   CO2 26 11/27/2022 1140   CO2 26 09/18/2016 1536   GLUCOSE 187 (H) 11/27/2022 1140   GLUCOSE 172 (H) 09/18/2016 1536   BUN 15 11/27/2022 1140   BUN 15.8 09/18/2016 1536   CREATININE 0.66 11/27/2022 1140   CREATININE 0.8 09/18/2016 1536   CALCIUM 9.5 11/27/2022 1140   CALCIUM 9.3 09/18/2016 1536   PROT 7.1 11/27/2022 1140   PROT 6.3 (L) 09/18/2016 1536   ALBUMIN 4.3 11/27/2022 1140   ALBUMIN 3.6 09/18/2016 1536   AST 16 11/27/2022 1140   AST 18 09/18/2016 1536   ALT 14 11/27/2022 1140   ALT 17 09/18/2016 1536   ALKPHOS 90 11/27/2022 1140   ALKPHOS 91 09/18/2016 1536   BILITOT 0.4 11/27/2022 1140   BILITOT 0.42 09/18/2016 1536   GFRNONAA >60 11/27/2022 1140   GFRAA >60 08/04/2019 0901   GFRAA >60 09/13/2018 0808    No results found for: "SPEP", "UPEP"  Lab Results  Component Value Date   WBC 5.8 11/27/2022   NEUTROABS 4.4 11/27/2022   HGB 12.0 11/27/2022   HCT 36.8 11/27/2022   MCV 66.8 (L) 11/27/2022   PLT 256 11/27/2022      Chemistry      Component Value Date/Time   NA 132 (L) 11/27/2022 1140   NA 136 09/18/2016 1536   K 3.5 11/27/2022 1140   K 3.9 09/18/2016 1536   CL 96 (L) 11/27/2022 1140   CO2 26 11/27/2022 1140   CO2 26 09/18/2016 1536   BUN 15 11/27/2022 1140   BUN 15.8 09/18/2016 1536   CREATININE 0.66 11/27/2022 1140   CREATININE 0.8 09/18/2016 1536      Component Value Date/Time   CALCIUM 9.5 11/27/2022 1140    CALCIUM 9.3 09/18/2016 1536   ALKPHOS 90 11/27/2022 1140   ALKPHOS 91 09/18/2016 1536   AST 16 11/27/2022 1140   AST 18 09/18/2016 1536   ALT 14 11/27/2022 1140   ALT 17 09/18/2016 1536   BILITOT 0.4 11/27/2022 1140   BILITOT 0.42 09/18/2016 1536

## 2022-11-27 NOTE — Patient Instructions (Signed)
[place] [facility]  Discharge Instructions: Thank you for choosing [facility] to provide your oncology and hematology care.   If you have a lab appointment with the Cancer Center, please go directly to the Cancer Center and check in at the registration area.   Wear comfortable clothing and clothing appropriate for easy access to any Portacath or PICC line.   We strive to give you quality time with your provider. You may need to reschedule your appointment if you arrive late (15 or more minutes).  Arriving late affects you and other patients whose appointments are after yours.  Also, if you miss three or more appointments without notifying the office, you may be dismissed from the clinic at the provider's discretion.      For prescription refill requests, have your pharmacy contact our office and allow 72 hours for refills to be completed.    Today you received the following chemotherapy and/or immunotherapy agents: pembrolizumab      To help prevent nausea and vomiting after your treatment, we encourage you to take your nausea medication as directed.  BELOW ARE SYMPTOMS THAT SHOULD BE REPORTED IMMEDIATELY: *FEVER GREATER THAN 100.4 F (38 C) OR HIGHER *CHILLS OR SWEATING *NAUSEA AND VOMITING THAT IS NOT CONTROLLED WITH YOUR NAUSEA MEDICATION *UNUSUAL SHORTNESS OF BREATH *UNUSUAL BRUISING OR BLEEDING *URINARY PROBLEMS (pain or burning when urinating, or frequent urination) *BOWEL PROBLEMS (unusual diarrhea, constipation, pain near the anus) TENDERNESS IN MOUTH AND THROAT WITH OR WITHOUT PRESENCE OF ULCERS (sore throat, sores in mouth, or a toothache) UNUSUAL RASH, SWELLING OR PAIN  UNUSUAL VAGINAL DISCHARGE OR ITCHING   Items with * indicate a potential emergency and should be followed up as soon as possible or go to the Emergency Department if any problems should occur.  Please show the CHEMOTHERAPY ALERT CARD or IMMUNOTHERAPY ALERT CARD at  check-in to the Emergency Department and triage nurse.  Should you have questions after your visit or need to cancel or reschedule your appointment, please contact [place] [facility]  Dept: [phone]  and follow the prompts.  Office hours are 8:00 a.m. to 4:30 p.m. Monday - Friday. Please note that voicemails left after 4:00 p.m. may not be returned until the following business day.  We are closed weekends and major holidays. You have access to a nurse at all times for urgent questions. Please call the main number to the clinic Dept: [phone] and follow the prompts.   For any non-urgent questions, you may also contact your provider using MyChart. We now offer e-Visits for anyone 39 and older to request care online for non-urgent symptoms. For details visit [email or web address].   Also download the MyChart app! Go to the app store, search "MyChart", open the app, select [place], and log in with your MyChart username and password.

## 2022-11-27 NOTE — Assessment & Plan Note (Signed)
She has no side effects on pembrolizumab She is in agreement to have pembrolizumab switched to every 6 weeks I do not plan to repeat imaging study until next year She has follow-up with GYN oncologist soon

## 2022-11-27 NOTE — Assessment & Plan Note (Signed)
She had recent symptoms of acid reflux I recommend antiacid medication This is not caused by her treatment

## 2022-11-28 LAB — T4: T4, Total: 10.8 ug/dL (ref 4.5–12.0)

## 2022-12-05 ENCOUNTER — Encounter: Payer: Self-pay | Admitting: Gynecologic Oncology

## 2022-12-05 ENCOUNTER — Other Ambulatory Visit: Payer: Self-pay

## 2022-12-05 ENCOUNTER — Inpatient Hospital Stay (HOSPITAL_BASED_OUTPATIENT_CLINIC_OR_DEPARTMENT_OTHER): Payer: Medicare Other | Admitting: Gynecologic Oncology

## 2022-12-05 VITALS — BP 146/76 | HR 65 | Temp 99.2°F | Resp 20 | Wt 154.6 lb

## 2022-12-05 DIAGNOSIS — L439 Lichen planus, unspecified: Secondary | ICD-10-CM | POA: Diagnosis not present

## 2022-12-05 DIAGNOSIS — C52 Malignant neoplasm of vagina: Secondary | ICD-10-CM

## 2022-12-05 DIAGNOSIS — K219 Gastro-esophageal reflux disease without esophagitis: Secondary | ICD-10-CM

## 2022-12-05 DIAGNOSIS — Z5112 Encounter for antineoplastic immunotherapy: Secondary | ICD-10-CM | POA: Diagnosis not present

## 2022-12-05 DIAGNOSIS — N9089 Other specified noninflammatory disorders of vulva and perineum: Secondary | ICD-10-CM

## 2022-12-05 DIAGNOSIS — C519 Malignant neoplasm of vulva, unspecified: Secondary | ICD-10-CM

## 2022-12-05 DIAGNOSIS — Z79899 Other long term (current) drug therapy: Secondary | ICD-10-CM | POA: Diagnosis not present

## 2022-12-05 DIAGNOSIS — Z923 Personal history of irradiation: Secondary | ICD-10-CM | POA: Diagnosis not present

## 2022-12-05 NOTE — H&P (View-Only) (Signed)
Gynecologic Oncology Return Clinic Visit  12/05/22  Reason for Visit: follow-up  Treatment History: Oncology History Overview Note  Patient followed due to HX pulmonary nodules noted 2011  Primary cancer of left upper lobe of lung (HCC)   Staging form: Lung, AJCC 7th Edition   - Clinical stage from 09/27/2015: Stage IA (T1b, N0, M0) - Signed by Si Gaul, MD on 09/27/2015  PDL-1 90%    Primary cancer of left upper lobe of lung (HCC)  07/12/2015 Imaging   CT Chest IMPRESSION:  The solid component of the large part solid nodule in the left upper lobe measures 9 x 3 mm (mean diameter 6 mm.) persistent part solid nodules with solid components greater than or equal to 6 mm should be considered highly suspicious for pulmonary adenocarcinoma   08/01/2015 Initial Diagnosis   Primary cancer of left upper lobe of lung (HCC)   08/01/2015 Surgery   PROCEDURE:  Left video-assisted thoracoscopy,           Wedge resection,           Thoracoscopic lingular sparing left upper lobectomy           Lymph node dissection           On-Q local anesthetic catheter placement   08/01/2015 Pathology Results   Lung, resection (segmental or lobe), Left Upper Lobe - ADENOCARCINOMA, WELL DIFFERENTIATED, SPANNING 2.5 CM. - THE SURGICAL RESECTION MARGINS ARE NEGATIVE FOR CARCINOMA. 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 She underwent a total abdominal hysterectomy and bilateral salpingo-oophorectomy, pelvic and  para-aortic lymphadenectomy. She received postoperative whole pelvis radiation therapy.    02/06/2015 Surgery   In December 2016 the patient was found to have a new primary squamous cell carcinoma the vulva undergoing a modified radical vulvectomy on 02/06/2015. She underwent a modified radical vulvectomy at [facility] on 02/06/2015. She was found to have a 3 cm vulvar lesion of the posterior vulva extending into the posterior vagina. A rhomboid flap was required to close the incision.  Final pathology showed some close surgical margins. However, the specimen margins were toward the time of surgery and it was Dr Nelwyn Salisbury impression that they were widely around the primary cancer. She discussed options with the patient and they agreed to monitor her closely but not recommend any radiation therapy to the vulva.   01/13/2017 Surgery   She experienced recurrent vulvar cancer October 2018 managed by a small modified radical vulvectomy of the right side on January 13, 2017.  Depth of invasion was 4 mm all margins were negative    03/13/2017 PET scan   1. No evidence of recurrent or metastatic disease. 2. Aortic atherosclerosis (ICD10-170.0). Coronary artery calcification.   07/20/2019 Miscellaneous   Examination of the vulva during routine surveillance on 07/20/19 showed a friable, verrucous lesion measuring approximately 2 cm was seen on the anterior wall of the suburethral vagina extending to the introitus and wrapping around to the right mid vagina. This was concerning for recurrence and  was biopsied at that appointment. 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    08/11/2019 Miscellaneous   On 08/11/19 she underwent total vaginectomy. Intraoperative findings were significant for a forshortened 3cm vagina, friable 4cm plaque on the anterior vagina extending to the right fornix and left posterior vaginal wall. This necessitated a total vaginectomy to resect the lesion grossly. Her tissues were extremely  friable due to age, prior radiation, menopause and lichen sclerosis. There was unavoidable fragmentation of the tissues during the resection. All gross visible disease was removed. Surgery was uncomplicated, and she reported no postop pain.  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 The suspicious foci are present at the edges of tissue fragments.  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        09/13/2019 PET scan   1. No specific evidence of recurrent vulvar carcinoma or distant metastases. There is nonspecific activity in the region of the vaginal introitus which could reflect urine contamination or vaginal recurrence. Correlation with physical examination recommended.  2. No abnormal thoracic activity status post left upper lobe wedge resection. 3. Coronary and Aortic Atherosclerosis (ICD10-I70.0).   09/29/2019 - 10/12/2019 Radiation Therapy   The patient received adjuvant vaginal brachytherapy between 09/29/2019 through 10/12/2019.     02/28/2021 PET scan   1. Interval decrease in degree of FDG uptake associated with the vaginal introitus. The SUV max on today's study is equal to 5.96 compared with 9.17 previously. 2. No highly concerning features to suggest nodal metastasis or distant metastatic disease. Mildly increased tracer uptake within and morphologically benign left inguinal lymph node is identified which warrants attention on follow-up imaging. 3. Stable postoperative changes within the left upper lobe without signs to suggest locally recurrent tumor. 4. Aortic Atherosclerosis (ICD10-I70.0). Coronary artery calcifications.   03/11/2021 Initial Diagnosis   Recurrent vaginal cancer   03/20/2021 Procedure   03/20/21: Vaginectomy, CO2 laser of the  vulva for recurrent vaginal cancer, high-grade vulvar dysplasia.  Vaginal excision showed squamous mucosa with ulceration and inflammation.  No malignancy or dysplasia identified.  Rare atypical "radiation" fibroblast embedded.    10/24/2021 Imaging   1. No acute abnormality of the abdomen or pelvis. 2. Aortic Atherosclerosis (ICD10-I70.0).   02/19/2022 Surgery   Preop Diagnosis: history of vulvar and vaginal cancer, suspicion for recurrent vulvar cancer   Postoperative Diagnosis: same as above   Surgery: Partial simple right/posterior vulvectomy with rotational rhomboid flap    Surgeons: Carin Hock MD    Pathology: posterior vulva (6-8 o'c) with stitch at 12, additional superior, lateral and inferior margins, deep margin   Operative findings: 2 x 2.5 cm verrucous appearing lesion along the perineum and extending to the introitus, relatively broad 2 cm base. Given size of defect and its location, required rotational flap to close defect. Hyperkeratotic areas within the significantly foreshortened vagina.   03/13/2022 PET scan    ADDENDUM REPORT: 03/14/2022 08:44   ADDENDUM: I just discussed this study with Dr. Pricilla Holm. The patient had surgery about 3 weeks ago for recurrent disease at the vaginal introitus with no residual disease apparent clinically. Additionally, the patient has had no chemotherapy in the 1 year interval since the prior study and no radiation therapy to the left groin region. In light of this additional history, the changes in the region the vaginal introitus today may  well simply represent urinary contaminant rather than residual disease. Further, given lack of interval systemic chemotherapy and no interval focused therapy to the left groin lymph node, the interval stability in this node (9 mm previously versus 10 mm today) and stable low level FDG accumulation ( SUV max = 2.9 today compared to 3.0 previously) would suggest a reactive etiology as more likely,  especially in light of the recurrent surgeries to the external genitalia.     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  Approximately 1 x 1 cm raised mass along the right distal vagina from 6-8 o'clock.  Second raised mass along the left vaginal sidewall extending almost up to the vaginal apex measuring approximately 1 x 1.5 cm.  Several small, less than 5 mm, masses along the vaginal apex and superior wall of the posterior vagina.  Well-healed lumbar incision from recent surgery.  Significant loss of architecture of the vulva with erythema noted of bilateral anterior labia and along the perineum.  Multiple biopsies taken including 2 and 10:00 along the vulva and at 5:00 along the posterior vulva.     05/07/2022 Pathology Results   CASE: [id number] [name] Surgical Pathology Report  Clinical History: Recurrent vulvar cancer (crm)  FINAL MICROSCOPIC DIAGNOSIS:  A. VAGINAL LESION, DISTAL RIGHT, EXCISION:      Invasive squamous cell carcinoma.      Cauterized carcinoma present at the mucosal margin. Deep margin are negative for carcinoma.  B. VAGINAL SIDEWALL, LEFT, EXCISION:      Invasive squamous cell carcinoma.      Cauterized carcinoma present at the mucosal margin. Deep margin are negative for carcinoma.  C. VAGINA, APEX, EXCISION:      At least squamous cell carcinoma in situ.  D. VAGINA, POSTERIOR, BIOPSY:      At least squamous cell carcinoma in situ.  E. VULVA, RIGHT, BIOPSY:      Squamous mucosa with acute  inflammation and reactive epithelial changes.      Negative for dysplasia or malignancy.  F. VULVA, LEFT, BIOPSY:      Squamous mucosa with acute and chronic inflammation and reactive epithelial changes.      Negative for dysplasia or malignancy.  G. VULVA, POSTERIOR, 5:00, BIOPSY:      Squamous mucosa with acute and chronic inflammation and reactive epithelial changes.      Negative for dysplasia or malignancy.     06/03/2022 Cancer Staging   Staging form: Vagina, AJCC 8th Edition - Clinical: cT1, cN0, cM0 - Signed by [name], MD on 06/03/2022   06/09/2022 - 06/09/2022 Chemotherapy   Patient is on Treatment Plan : UTERINE Pembrolizumab (200) q21d     06/09/2022 - 06/09/2022 Chemotherapy   Patient is on Treatment Plan : UTERINE Carboplatin (AUC 5) q21d     06/30/2022 -  Chemotherapy   Patient is on Treatment Plan : UTERINE Pembrolizumab (400) q42d     09/26/2022 PET scan   NM PET Image Restage (PS) Skull Base to Thigh (F-18 FDG)  Result Date: 09/25/2022 CLINICAL DATA:  Subsequent treatment strategy for recurrent  vaginal cancer. Currently on immunotherapy. Biopsy 05/07/2022. EXAM: NUCLEAR MEDICINE PET SKULL BASE TO THIGH TECHNIQUE: 7.7 mCi F-18 FDG was injected intravenously. Full-ring PET imaging was performed from the skull base to thigh after the radiotracer. CT data was obtained and used for attenuation correction and anatomic localization. Fasting blood glucose: 107 mg/dl COMPARISON:  40/11/2723 FINDINGS: Mediastinal blood pool activity: SUV max 2.2 Liver activity: SUV max NA NECK: No areas of abnormal hypermetabolism. Incidental CT findings: Cerebral atrophy. No cervical adenopathy. Bilateral carotid atherosclerosis. CHEST: No pulmonary parenchymal or thoracic nodal hypermetabolism. Incidental CT findings: Surgical changes about the left hilum and left upper lobe consistent with clinical history of prior wedge resection. Aortic and coronary artery calcification. Esophageal fluid level  on 60/4. 3 mm right lower lobe pulmonary nodule on 78/4 is unchanged. ABDOMEN/PELVIS: No abdominopelvic parenchymal hypermetabolism. Abdominopelvic nodal dissection. Mildly hypermetabolic left inguinal nodes. The more cephalad measures 8 mm and a S.U.V. max of 2.6 on 168/4 versus 6 mm and a S.U.V. max of 1.8 on the prior. More caudal measures 1.0 cm and a S.U.V. max of 3.7 on 173/4 versus similar in size and a S.U.V. max of 2.9 on the prior. Incidental CT findings: Scarred upper pole right kidney. Normal adrenal glands. Abdominal aortic atherosclerosis. Hysterectomy. Pelvic floor laxity with mild cystocele. SKELETON: No abnormal marrow activity. Incidental CT findings: Right-greater-than-left hip osteoarthritis. IMPRESSION: 1. Left inguinal nodes which demonstrate mild increase in hypermetabolism. 1 has minimally enlarged. These are again favored to be reactive but warrant follow-up attention. 2. Otherwise, no evidence of recurrent or metastatic disease, status post hysterectomy and abdominopelvic node dissection. 3. Incidental findings, including: Coronary artery atherosclerosis. Aortic Atherosclerosis (ICD10-I70.0). Esophageal air fluid level suggests dysmotility or gastroesophageal reflux. Electronically Signed   By: Jeronimo Greaves M.D.   On: 09/25/2022 14:57       C1 of carbo only on 06/09/22 - not tolerated Single agent Pembro started 06/30/22 C7 of pembro on 10/17 (q 6 wk dosing)  Interval History: Doing well.  Denies any vaginal bleeding or discharge.  Notes that she has felt an area along her posterior vulva that is new since the last time I saw her but was there prior to her most recent PET scan in August.  Continues to have some symptoms related to lichen sclerosus, uses tacrolimus as needed with good relief.  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 cath due to past injury   History of benign kidney tumor excluding renal pelvis 07/26/2009   s/p  right partial nephrectomy for angiomyolipoma   History of cervical cancer 03/1995   s/p   TAH /  BSO for poorly differentiated cervical carcinoma.   History of endometrial cancer 03/1995   s/p  TAH/ BSO for cervical cancer w/ incidental finding endometrial cancer also ,  completed radiation 11/ 1997   History of external beam radiation therapy 1997   completed abominal radation for endometrial cancer 11/ 1997   History of radiation therapy 10/12/2019   [facility] HDR brachytherapy  09/29/2019-10/12/2019  Dr Antony Blackbird  (recurrent VAIN)   History of resection of meningioma 11/1968  T12   History of small bowel obstruction 12/2008   partial SBO admission 12/2008;  recurrent SBO in 02/2011;  02/ 2017;   so far no surgical intervention (per pt has a narrowed distal colon)   Hyperlipidemia    Hypertension    Hypothyroidism    followed by pcp   Lichen sclerosus of vulva    per pt w/ chronic itching   Nocturia    Pre-diabetes    Primary cancer of left upper lobe of lung (HCC) 08/01/2015   previously followed by oncologist--- dr Phill Myron--- released 09-13-2018  , pt non-smokerStage 1  Non-small cell ,  no chemo or radiation,  survillance   (bilateral nodules for noted in 2011)   Recurrent vaginal cancer (HCC) 08/2019   GYN oncologist--- dr Pricilla Holm---- total vaginectomy and completed radiation 09/ 2021;    laser ablation 02/ 2023   Recurrent vulvar cancer [facility]) 2016   GYN oncologist---- dr Pricilla Holm;   new dx 12/ 2016  s/p radica vulvectomy @ [facility] by dr Stanford Breed recurrent 10/ 2018 s/p modified vulvecotmy;  06/ 2021 invasive to vagina/ recurrent vulva s/p total vagainectomy & radiation; laser ablation 02/ 2023;   new lesion 12/ 2023 s/p simplec  vulectomy w/ flap 01/ 2024    Past Surgical History:  Procedure Laterality Date   CATARACT EXTRACTION W/ INTRAOCULAR LENS IMPLANT Bilateral 2006   CO2 LASER APPLICATION N/A 03/20/2021   Procedure: CO2 LASER APPLICATION to vagina and vuvla;  Surgeon: Carver Fila, MD;  Location: [facility];  Service: Gynecology;  Laterality: N/A;   CO2 LASER APPLICATION N/A 05/07/2022   Procedure: POSSIBLE CO2 LASER APPLICATION TO VAGINA/VULVA;  Surgeon: Carver Fila, MD;  Location: [facility];  Service: Gynecology;  Laterality: N/A;   LYMPH NODE DISSECTION Left 08/01/2015   Procedure: LYMPH NODE DISSECTION;  Surgeon: Loreli Slot, MD;  Location: [facility] OR;  Service: Thoracic;  Laterality: Left;   RADICAL VULVECTOMY  02/06/2015   @[facility]  by dr Stanford Breed   ROBOTIC ASSITED PARTIAL NEPHRECTOMY  07/26/2009   @[facility]  by dr Laverle Patter   SEGMENTECOMY Left 08/01/2015   Procedure: Left Upper Lobe SEGMENTECTOMY;  Surgeon: Loreli Slot, MD;  Location: [facility] OR;  Service: Thoracic;  Laterality: Left;   TONSILLECTOMY AND ADENOIDECTOMY     child   TOTAL ABDOMINAL HYSTERECTOMY W/ BILATERAL SALPINGOOPHORECTOMY  03/31/1995   @[facility]  by dr Stanford Breed;   TAH/BSO pelvic and periaortic lymphadenectomy   TUBAL LIGATION Bilateral    yrs ago   TUMOR REMOVAL  11/1968   meningioma  T 12   VAGINECTOMY, PARTIAL N/A 08/11/2019   Procedure: TOTAL VAGINECTOMY;  Surgeon: Adolphus Birchwood, MD;  Location: [facility];  Service: Gynecology;  Laterality: N/A;   VAGINECTOMY, PARTIAL N/A 03/20/2021   Procedure: vaginal excision;  Surgeon: Carver Fila, MD;  Location: [facility];  Service: Gynecology;  Laterality: N/A;   VIDEO ASSISTED THORACOSCOPY (VATS)/WEDGE RESECTION Left 08/01/2015   Procedure: VIDEO ASSISTED THORACOSCOPY (VATS)/WEDGE RESECTION;  Surgeon: Loreli Slot, MD;  Location: [facility] OR;  Service: Thoracic;  Laterality: Left;   VULVA Ples Specter BIOPSY N/A  02/14/2021   Procedure: VAGINAL AND VULVAR BIOPSY;  Surgeon: Carver Fila, MD;  Location: [facility];  Service: Gynecology;  Laterality: N/A;   VULVA /PERINEUM BIOPSY N/A 05/07/2022   Procedure: POSSIBLE VAGINAL  BIOPSY;  Surgeon: Carver Fila, MD;  Location: [facility];  Service: Gynecology;  Laterality: N/A;   VULVAR LESION REMOVAL N/A  02/19/2022   Procedure: VULVAR LESION EXCISION, ROTATIONAL FLAP CLOSURE;  Surgeon: Carver Fila, MD;  Location: [facility];  Service: Gynecology;  Laterality: N/A;   VULVECTOMY N/A 05/07/2022   Procedure: POSSIBLE WIDE EXCISION VULVECTOMY/VAGINA;  Surgeon: Carver Fila, MD;  Location[facility];  Service: Gynecology;  Laterality: N/A;   VULVECTOMY PARTIAL  01/13/2017   @[facility]  by dr Stanford Breed    Family History  Problem Relation Age of Onset   Hypertension Other    Diabetes Other    Stroke Other    CAD Other    Colon cancer Neg Hx    Breast cancer Neg Hx    Ovarian cancer Neg Hx    Endometrial cancer Neg Hx    Pancreatic cancer Neg Hx    Prostate cancer Neg Hx     Social History   Socioeconomic History   Marital status: Divorced    Spouse name: Not on file   Number of children: 2   Years of education: Not on file   Highest education level: Not on file  Occupational History   Not on file  Tobacco Use   Smoking status: Never   Smokeless tobacco: Never  Vaping Use   Vaping status: Never Used  Substance and Sexual Activity   Alcohol use: Not Currently    Comment: Occ   Drug use: Never   Sexual activity: Yes    Birth control/protection: Surgical  Other Topics Concern   Not on file  Social History Narrative   Lives at home alone, works out at [employer] 6 days a week, still very active. Has an ex-husband who she is in contact with. Son died of an MI and another son in [place].    Social Determinants of Health   Financial Resource Strain: Not on file  Food  Insecurity: Not on file  Transportation Needs: Not on file  Physical Activity: Not on file  Stress: Not on file  Social Connections: Not on file    Current Medications:  Current Outpatient Medications:    acetaminophen (TYLENOL) 500 MG tablet, Take 500-1,000 mg by mouth every 6 (six) hours as needed (pain.)., Disp: , Rfl:    acyclovir (ZOVIRAX) 800 MG tablet, Take 800 mg by mouth 2 (two) times daily as needed (cold sores)., Disp: , Rfl:    albuterol (VENTOLIN HFA) 108 (90 Base) MCG/ACT inhaler, Inhale 2 puffs into the lungs every 6 (six) hours as needed for wheezing or shortness of breath., Disp: , Rfl:    amLODipine (NORVASC) 5 MG tablet, Take 5 mg by mouth every evening., Disp: , Rfl:    Ascorbic Acid (VITAMIN C PO), Take 500 mg by mouth in the morning., Disp: , Rfl:    benazepril-hydrochlorthiazide (LOTENSIN HCT) 20-25 MG tablet, Take 1 tablet by mouth in the morning., Disp: , Rfl:    benzonatate (TESSALON) 200 MG capsule, Take 200 mg by mouth 2 (two) times daily as needed for cough., Disp: , Rfl:    BIOTIN PO, Take 1 tablet by mouth in the morning., Disp: , Rfl:    Cholecalciferol (VITAMIN D3 PO), Take 2,000 Units by mouth in the morning and at bedtime., Disp: , Rfl:    docusate sodium (COLACE) 100 MG capsule, Take 100 mg by mouth 2 (two) times daily., Disp: , Rfl:    doxylamine, Sleep, (UNISOM) 25 MG tablet, Take 12.5 mg by mouth at bedtime., Disp: , Rfl:    levothyroxine (SYNTHROID, LEVOTHROID) 75 MCG tablet, Take 75  mcg by mouth daily before breakfast., Disp: , Rfl:    MAGNESIUM PO, Take 1 tablet by mouth every evening., Disp: , Rfl:    Multiple Vitamin (MULTIVITAMIN WITH MINERALS) TABS tablet, Take 1 tablet by mouth in the morning., Disp: , Rfl:    Omega-3 Fatty Acids (FISH OIL PO), Take 1 capsule by mouth in the morning., Disp: , Rfl:    Polyethyl Glycol-Propyl Glycol (SYSTANE ULTRA) 0.4-0.3 % SOLN, Place 1-2 drops into both eyes 3 (three) times daily as needed (dry/irritated  eyes.)., Disp: , Rfl:    polyethylene glycol (MIRALAX / GLYCOLAX) packet, Take 11.3333 g by mouth at bedtime. 2/3 capful, Disp: , Rfl:    simvastatin (ZOCOR) 20 MG tablet, Take 20 mg by mouth at bedtime. , Disp: , Rfl:    tacrolimus (PROTOPIC) 0.1 % ointment, Apply topically to the vulva twice daily (per Dr. Pricilla Holm), Disp: 30 g, Rfl: 1   TURMERIC PO, Take 1 capsule by mouth every evening., Disp: , Rfl:   Review of Systems: Denies appetite changes, fevers, chills, fatigue, unexplained weight changes. Denies hearing loss, neck lumps or masses, mouth sores, ringing in ears or voice changes. Denies cough or wheezing.  Denies shortness of breath. Denies chest pain or palpitations. Denies leg swelling. Denies abdominal distention, pain, blood in stools, constipation, diarrhea, nausea, vomiting, or early satiety. Denies pain with intercourse, dysuria, frequency, hematuria or incontinence. Denies hot flashes, pelvic pain, vaginal bleeding or vaginal discharge.   Denies joint pain, back pain or muscle pain/cramps. Denies itching, rash, or wounds. Denies dizziness, headaches, numbness or seizures. Denies swollen lymph nodes or glands, denies easy bruising or bleeding. Denies anxiety, depression, confusion, or decreased concentration.  Physical Exam: BP (!) 166/80 (BP Location: Left Arm, Patient Position: Sitting) Comment: Notified RN  Pulse 65   Temp 99.2 F (37.3 C) (Oral)   Resp 20   Wt 154 lb 9.6 oz (70.1 kg)   SpO2 100%   BMI 25.73 kg/m  General: Alert, oriented, no acute distress. HEENT: Normocephalic, atraumatic, sclera anicteric. Chest: Unlabored breathing on room air.   GU: No amount of the genitalia noted.  Some areas of leukoplakia along the posterior vulva without atypical vasculature or ulcerations.  There is a 2-3 cm fleshy, slightly raised lesion along the posterior perineum at 6:00, just anterior to the anus, new since her last visit.  This area is not friable.  Biopsy  performed today.  The vagina itself is almost closed.  On 1 finger digit exam, no nodularity appreciated although I am only able to palpate approximately 1-2 cm.  Vulvar biopsy procedure Preoperative diagnosis: Vulvar lesion Postoperative diagnosis: Same as above Physician: Pricilla Holm MD Estimated blood loss: Minimal Specimens: Vulvar biopsy at 6:00 Procedure: After the procedure was discussed with the patient including risks and benefits, she gave verbal consent.  He was already in dorsal lithotomy position with the lesion visualized.  With her permission, picture was taken and placed in media.  The vulvar lesion was then cleansed with Betadine x 3.  Injected for local anesthesia.  After adequate time was given, Tischler forceps were used to take a biopsy from the posterior edge of the lesion.  This was placed in formalin.  Over nitrate was used to achieve hemostasis.  Overall the patient tolerated the procedure well.    Laboratory & Radiologic Studies: 09/22/22: PET 1. Left inguinal nodes which demonstrate mild increase in hypermetabolism. 1 has minimally enlarged. These are again favored to be reactive but warrant follow-up attention.  2. Otherwise, no evidence of recurrent or metastatic disease, status post hysterectomy and abdominopelvic node dissection. 3. Incidental findings, including: Coronary artery atherosclerosis. Aortic Atherosclerosis (ICD10-I70.0). Esophageal air fluid level suggests dysmotility or gastroesophageal reflux.  Assessment & Plan: [name] Richards is a 77 y.o. 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  Did not tolerate carboplatin, now on pembrolizumab  setting of a CPS score of 90%. New vulvar lesion on exam today, biopsy performed.   The patient is overall doing very well.  Has had several flares of her lichen sclerosus that are treated with tacrolimus.  She has gotten excellent relief from this.    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    Discussed exam findings today.  Biopsy performed.  I will call her with these results.  22 minutes of total time was spent for this patient encounter, including preparation, face-to-face counseling with the patient and coordination of care, and documentation of the encounter.  Eugene Garnet, MD  Division of Gynecologic Oncology  Department of Obstetrics and Gynecology  [facility]

## 2022-12-05 NOTE — Patient Instructions (Signed)
It was good to see you today.  I will let you know soon as I get the biopsy back next week.  We will tentatively get you scheduled for a visit to see me in 3 months.

## 2022-12-05 NOTE — Progress Notes (Signed)
Gynecologic Oncology Return Clinic Visit  12/05/22  Reason for Visit: follow-up  Treatment History: Oncology History Overview Note  Patient followed due to HX pulmonary nodules noted 2011  Primary cancer of left upper lobe of lung (HCC)   Staging form: Lung, AJCC 7th Edition   - Clinical stage from 09/27/2015: Stage IA (T1b, N0, M0) - Signed by Si Gaul, MD on 09/27/2015  PDL-1 90%    Primary cancer of left upper lobe of lung (HCC)  07/12/2015 Imaging   CT Chest IMPRESSION:  The solid component of the large part solid nodule in the left upper lobe measures 9 x 3 mm (mean diameter 6 mm.) persistent part solid nodules with solid components greater than or equal to 6 mm should be considered highly suspicious for pulmonary adenocarcinoma   08/01/2015 Initial Diagnosis   Primary cancer of left upper lobe of lung (HCC)   08/01/2015 Surgery   PROCEDURE:  Left video-assisted thoracoscopy,           Wedge resection,           Thoracoscopic lingular sparing left upper lobectomy           Lymph node dissection           On-Q local anesthetic catheter placement   08/01/2015 Pathology Results   Lung, resection (segmental or lobe), Left Upper Lobe - ADENOCARCINOMA, WELL DIFFERENTIATED, SPANNING 2.5 CM. - THE SURGICAL RESECTION MARGINS ARE NEGATIVE FOR CARCINOMA. 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 She underwent a total abdominal hysterectomy and bilateral salpingo-oophorectomy, pelvic and  para-aortic lymphadenectomy. She received postoperative whole pelvis radiation therapy.    02/06/2015 Surgery   In December 2016 the patient was found to have a new primary squamous cell carcinoma the vulva undergoing a modified radical vulvectomy on 02/06/2015. She underwent a modified radical vulvectomy at [facility] on 02/06/2015. She was found to have a 3 cm vulvar lesion of the posterior vulva extending into the posterior vagina. A rhomboid flap was required to close the incision.  Final pathology showed some close surgical margins. However, the specimen margins were toward the time of surgery and it was Dr Nelwyn Salisbury impression that they were widely around the primary cancer. She discussed options with the patient and they agreed to monitor her closely but not recommend any radiation therapy to the vulva.   01/13/2017 Surgery   She experienced recurrent vulvar cancer October 2018 managed by a small modified radical vulvectomy of the right side on January 13, 2017.  Depth of invasion was 4 mm all margins were negative    03/13/2017 PET scan   1. No evidence of recurrent or metastatic disease. 2. Aortic atherosclerosis (ICD10-170.0). Coronary artery calcification.   07/20/2019 Miscellaneous   Examination of the vulva during routine surveillance on 07/20/19 showed a friable, verrucous lesion measuring approximately 2 cm was seen on the anterior wall of the suburethral vagina extending to the introitus and wrapping around to the right mid vagina. This was concerning for recurrence and  was biopsied at that appointment. 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    08/11/2019 Miscellaneous   On 08/11/19 she underwent total vaginectomy. Intraoperative findings were significant for a forshortened 3cm vagina, friable 4cm plaque on the anterior vagina extending to the right fornix and left posterior vaginal wall. This necessitated a total vaginectomy to resect the lesion grossly. Her tissues were extremely  friable due to age, prior radiation, menopause and lichen sclerosis. There was unavoidable fragmentation of the tissues during the resection. All gross visible disease was removed. Surgery was uncomplicated, and she reported no postop pain.  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 The suspicious foci are present at the edges of tissue fragments.  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        09/13/2019 PET scan   1. No specific evidence of recurrent vulvar carcinoma or distant metastases. There is nonspecific activity in the region of the vaginal introitus which could reflect urine contamination or vaginal recurrence. Correlation with physical examination recommended.  2. No abnormal thoracic activity status post left upper lobe wedge resection. 3. Coronary and Aortic Atherosclerosis (ICD10-I70.0).   09/29/2019 - 10/12/2019 Radiation Therapy   The patient received adjuvant vaginal brachytherapy between 09/29/2019 through 10/12/2019.     02/28/2021 PET scan   1. Interval decrease in degree of FDG uptake associated with the vaginal introitus. The SUV max on today's study is equal to 5.96 compared with 9.17 previously. 2. No highly concerning features to suggest nodal metastasis or distant metastatic disease. Mildly increased tracer uptake within and morphologically benign left inguinal lymph node is identified which warrants attention on follow-up imaging. 3. Stable postoperative changes within the left upper lobe without signs to suggest locally recurrent tumor. 4. Aortic Atherosclerosis (ICD10-I70.0). Coronary artery calcifications.   03/11/2021 Initial Diagnosis   Recurrent vaginal cancer   03/20/2021 Procedure   03/20/21: Vaginectomy, CO2 laser of the  vulva for recurrent vaginal cancer, high-grade vulvar dysplasia.  Vaginal excision showed squamous mucosa with ulceration and inflammation.  No malignancy or dysplasia identified.  Rare atypical "radiation" fibroblast embedded.    10/24/2021 Imaging   1. No acute abnormality of the abdomen or pelvis. 2. Aortic Atherosclerosis (ICD10-I70.0).   02/19/2022 Surgery   Preop Diagnosis: history of vulvar and vaginal cancer, suspicion for recurrent vulvar cancer   Postoperative Diagnosis: same as above   Surgery: Partial simple right/posterior vulvectomy with rotational rhomboid flap    Surgeons: Carin Hock MD    Pathology: posterior vulva (6-8 o'c) with stitch at 12, additional superior, lateral and inferior margins, deep margin   Operative findings: 2 x 2.5 cm verrucous appearing lesion along the perineum and extending to the introitus, relatively broad 2 cm base. Given size of defect and its location, required rotational flap to close defect. Hyperkeratotic areas within the significantly foreshortened vagina.   03/13/2022 PET scan    ADDENDUM REPORT: 03/14/2022 08:44   ADDENDUM: I just discussed this study with Dr. Pricilla Holm. The patient had surgery about 3 weeks ago for recurrent disease at the vaginal introitus with no residual disease apparent clinically. Additionally, the patient has had no chemotherapy in the 1 year interval since the prior study and no radiation therapy to the left groin region. In light of this additional history, the changes in the region the vaginal introitus today may  well simply represent urinary contaminant rather than residual disease. Further, given lack of interval systemic chemotherapy and no interval focused therapy to the left groin lymph node, the interval stability in this node (9 mm previously versus 10 mm today) and stable low level FDG accumulation ( SUV max = 2.9 today compared to 3.0 previously) would suggest a reactive etiology as more likely,  especially in light of the recurrent surgeries to the external genitalia.     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  Approximately 1 x 1 cm raised mass along the right distal vagina from 6-8 o'clock.  Second raised mass along the left vaginal sidewall extending almost up to the vaginal apex measuring approximately 1 x 1.5 cm.  Several small, less than 5 mm, masses along the vaginal apex and superior wall of the posterior vagina.  Well-healed lumbar incision from recent surgery.  Significant loss of architecture of the vulva with erythema noted of bilateral anterior labia and along the perineum.  Multiple biopsies taken including 2 and 10:00 along the vulva and at 5:00 along the posterior vulva.     05/07/2022 Pathology Results   CASE: [id number] [name] Surgical Pathology Report  Clinical History: Recurrent vulvar cancer (crm)  FINAL MICROSCOPIC DIAGNOSIS:  A. VAGINAL LESION, DISTAL RIGHT, EXCISION:      Invasive squamous cell carcinoma.      Cauterized carcinoma present at the mucosal margin. Deep margin are negative for carcinoma.  B. VAGINAL SIDEWALL, LEFT, EXCISION:      Invasive squamous cell carcinoma.      Cauterized carcinoma present at the mucosal margin. Deep margin are negative for carcinoma.  C. VAGINA, APEX, EXCISION:      At least squamous cell carcinoma in situ.  D. VAGINA, POSTERIOR, BIOPSY:      At least squamous cell carcinoma in situ.  E. VULVA, RIGHT, BIOPSY:      Squamous mucosa with acute  inflammation and reactive epithelial changes.      Negative for dysplasia or malignancy.  F. VULVA, LEFT, BIOPSY:      Squamous mucosa with acute and chronic inflammation and reactive epithelial changes.      Negative for dysplasia or malignancy.  G. VULVA, POSTERIOR, 5:00, BIOPSY:      Squamous mucosa with acute and chronic inflammation and reactive epithelial changes.      Negative for dysplasia or malignancy.     06/03/2022 Cancer Staging   Staging form: Vagina, AJCC 8th Edition - Clinical: cT1, cN0, cM0 - Signed by [name], MD on 06/03/2022   06/09/2022 - 06/09/2022 Chemotherapy   Patient is on Treatment Plan : UTERINE Pembrolizumab (200) q21d     06/09/2022 - 06/09/2022 Chemotherapy   Patient is on Treatment Plan : UTERINE Carboplatin (AUC 5) q21d     06/30/2022 -  Chemotherapy   Patient is on Treatment Plan : UTERINE Pembrolizumab (400) q42d     09/26/2022 PET scan   NM PET Image Restage (PS) Skull Base to Thigh (F-18 FDG)  Result Date: 09/25/2022 CLINICAL DATA:  Subsequent treatment strategy for recurrent  vaginal cancer. Currently on immunotherapy. Biopsy 05/07/2022. EXAM: NUCLEAR MEDICINE PET SKULL BASE TO THIGH TECHNIQUE: 7.7 mCi F-18 FDG was injected intravenously. Full-ring PET imaging was performed from the skull base to thigh after the radiotracer. CT data was obtained and used for attenuation correction and anatomic localization. Fasting blood glucose: 107 mg/dl COMPARISON:  40/11/2723 FINDINGS: Mediastinal blood pool activity: SUV max 2.2 Liver activity: SUV max NA NECK: No areas of abnormal hypermetabolism. Incidental CT findings: Cerebral atrophy. No cervical adenopathy. Bilateral carotid atherosclerosis. CHEST: No pulmonary parenchymal or thoracic nodal hypermetabolism. Incidental CT findings: Surgical changes about the left hilum and left upper lobe consistent with clinical history of prior wedge resection. Aortic and coronary artery calcification. Esophageal fluid level  on 60/4. 3 mm right lower lobe pulmonary nodule on 78/4 is unchanged. ABDOMEN/PELVIS: No abdominopelvic parenchymal hypermetabolism. Abdominopelvic nodal dissection. Mildly hypermetabolic left inguinal nodes. The more cephalad measures 8 mm and a S.U.V. max of 2.6 on 168/4 versus 6 mm and a S.U.V. max of 1.8 on the prior. More caudal measures 1.0 cm and a S.U.V. max of 3.7 on 173/4 versus similar in size and a S.U.V. max of 2.9 on the prior. Incidental CT findings: Scarred upper pole right kidney. Normal adrenal glands. Abdominal aortic atherosclerosis. Hysterectomy. Pelvic floor laxity with mild cystocele. SKELETON: No abnormal marrow activity. Incidental CT findings: Right-greater-than-left hip osteoarthritis. IMPRESSION: 1. Left inguinal nodes which demonstrate mild increase in hypermetabolism. 1 has minimally enlarged. These are again favored to be reactive but warrant follow-up attention. 2. Otherwise, no evidence of recurrent or metastatic disease, status post hysterectomy and abdominopelvic node dissection. 3. Incidental findings, including: Coronary artery atherosclerosis. Aortic Atherosclerosis (ICD10-I70.0). Esophageal air fluid level suggests dysmotility or gastroesophageal reflux. Electronically Signed   By: Jeronimo Greaves M.D.   On: 09/25/2022 14:57       C1 of carbo only on 06/09/22 - not tolerated Single agent Pembro started 06/30/22 C7 of pembro on 10/17 (q 6 wk dosing)  Interval History: Doing well.  Denies any vaginal bleeding or discharge.  Notes that she has felt an area along her posterior vulva that is new since the last time I saw her but was there prior to her most recent PET scan in August.  Continues to have some symptoms related to lichen sclerosus, uses tacrolimus as needed with good relief.  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 cath due to past injury   History of benign kidney tumor excluding renal pelvis 07/26/2009   s/p  right partial nephrectomy for angiomyolipoma   History of cervical cancer 03/1995   s/p   TAH /  BSO for poorly differentiated cervical carcinoma.   History of endometrial cancer 03/1995   s/p  TAH/ BSO for cervical cancer w/ incidental finding endometrial cancer also ,  completed radiation 11/ 1997   History of external beam radiation therapy 1997   completed abominal radation for endometrial cancer 11/ 1997   History of radiation therapy 10/12/2019   [facility] HDR brachytherapy  09/29/2019-10/12/2019  Dr Antony Blackbird  (recurrent VAIN)   History of resection of meningioma 11/1968  T12   History of small bowel obstruction 12/2008   partial SBO admission 12/2008;  recurrent SBO in 02/2011;  02/ 2017;   so far no surgical intervention (per pt has a narrowed distal colon)   Hyperlipidemia    Hypertension    Hypothyroidism    followed by pcp   Lichen sclerosus of vulva    per pt w/ chronic itching   Nocturia    Pre-diabetes    Primary cancer of left upper lobe of lung (HCC) 08/01/2015   previously followed by oncologist--- dr Phill Myron--- released 09-13-2018  , pt non-smokerStage 1  Non-small cell ,  no chemo or radiation,  survillance   (bilateral nodules for noted in 2011)   Recurrent vaginal cancer (HCC) 08/2019   GYN oncologist--- dr Pricilla Holm---- total vaginectomy and completed radiation 09/ 2021;    laser ablation 02/ 2023   Recurrent vulvar cancer [facility]) 2016   GYN oncologist---- dr Pricilla Holm;   new dx 12/ 2016  s/p radica vulvectomy @ [facility] by dr Stanford Breed recurrent 10/ 2018 s/p modified vulvecotmy;  06/ 2021 invasive to vagina/ recurrent vulva s/p total vagainectomy & radiation; laser ablation 02/ 2023;   new lesion 12/ 2023 s/p simplec  vulectomy w/ flap 01/ 2024    Past Surgical History:  Procedure Laterality Date   CATARACT EXTRACTION W/ INTRAOCULAR LENS IMPLANT Bilateral 2006   CO2 LASER APPLICATION N/A 03/20/2021   Procedure: CO2 LASER APPLICATION to vagina and vuvla;  Surgeon: Carver Fila, MD;  Location: [facility];  Service: Gynecology;  Laterality: N/A;   CO2 LASER APPLICATION N/A 05/07/2022   Procedure: POSSIBLE CO2 LASER APPLICATION TO VAGINA/VULVA;  Surgeon: Carver Fila, MD;  Location: [facility];  Service: Gynecology;  Laterality: N/A;   LYMPH NODE DISSECTION Left 08/01/2015   Procedure: LYMPH NODE DISSECTION;  Surgeon: Loreli Slot, MD;  Location: [facility] OR;  Service: Thoracic;  Laterality: Left;   RADICAL VULVECTOMY  02/06/2015   @[facility]  by dr Stanford Breed   ROBOTIC ASSITED PARTIAL NEPHRECTOMY  07/26/2009   @[facility]  by dr Laverle Patter   SEGMENTECOMY Left 08/01/2015   Procedure: Left Upper Lobe SEGMENTECTOMY;  Surgeon: Loreli Slot, MD;  Location: [facility] OR;  Service: Thoracic;  Laterality: Left;   TONSILLECTOMY AND ADENOIDECTOMY     child   TOTAL ABDOMINAL HYSTERECTOMY W/ BILATERAL SALPINGOOPHORECTOMY  03/31/1995   @[facility]  by dr Stanford Breed;   TAH/BSO pelvic and periaortic lymphadenectomy   TUBAL LIGATION Bilateral    yrs ago   TUMOR REMOVAL  11/1968   meningioma  T 12   VAGINECTOMY, PARTIAL N/A 08/11/2019   Procedure: TOTAL VAGINECTOMY;  Surgeon: Adolphus Birchwood, MD;  Location: [facility];  Service: Gynecology;  Laterality: N/A;   VAGINECTOMY, PARTIAL N/A 03/20/2021   Procedure: vaginal excision;  Surgeon: Carver Fila, MD;  Location: [facility];  Service: Gynecology;  Laterality: N/A;   VIDEO ASSISTED THORACOSCOPY (VATS)/WEDGE RESECTION Left 08/01/2015   Procedure: VIDEO ASSISTED THORACOSCOPY (VATS)/WEDGE RESECTION;  Surgeon: Loreli Slot, MD;  Location: [facility] OR;  Service: Thoracic;  Laterality: Left;   VULVA Ples Specter BIOPSY N/A  02/14/2021   Procedure: VAGINAL AND VULVAR BIOPSY;  Surgeon: Carver Fila, MD;  Location: [facility];  Service: Gynecology;  Laterality: N/A;   VULVA /PERINEUM BIOPSY N/A 05/07/2022   Procedure: POSSIBLE VAGINAL  BIOPSY;  Surgeon: Carver Fila, MD;  Location: [facility];  Service: Gynecology;  Laterality: N/A;   VULVAR LESION REMOVAL N/A  02/19/2022   Procedure: VULVAR LESION EXCISION, ROTATIONAL FLAP CLOSURE;  Surgeon: Carver Fila, MD;  Location: [facility];  Service: Gynecology;  Laterality: N/A;   VULVECTOMY N/A 05/07/2022   Procedure: POSSIBLE WIDE EXCISION VULVECTOMY/VAGINA;  Surgeon: Carver Fila, MD;  Location[facility];  Service: Gynecology;  Laterality: N/A;   VULVECTOMY PARTIAL  01/13/2017   @[facility]  by dr Stanford Breed    Family History  Problem Relation Age of Onset   Hypertension Other    Diabetes Other    Stroke Other    CAD Other    Colon cancer Neg Hx    Breast cancer Neg Hx    Ovarian cancer Neg Hx    Endometrial cancer Neg Hx    Pancreatic cancer Neg Hx    Prostate cancer Neg Hx     Social History   Socioeconomic History   Marital status: Divorced    Spouse name: Not on file   Number of children: 2   Years of education: Not on file   Highest education level: Not on file  Occupational History   Not on file  Tobacco Use   Smoking status: Never   Smokeless tobacco: Never  Vaping Use   Vaping status: Never Used  Substance and Sexual Activity   Alcohol use: Not Currently    Comment: Occ   Drug use: Never   Sexual activity: Yes    Birth control/protection: Surgical  Other Topics Concern   Not on file  Social History Narrative   Lives at home alone, works out at [employer] 6 days a week, still very active. Has an ex-husband who she is in contact with. Son died of an MI and another son in [place].    Social Determinants of Health   Financial Resource Strain: Not on file  Food  Insecurity: Not on file  Transportation Needs: Not on file  Physical Activity: Not on file  Stress: Not on file  Social Connections: Not on file    Current Medications:  Current Outpatient Medications:    acetaminophen (TYLENOL) 500 MG tablet, Take 500-1,000 mg by mouth every 6 (six) hours as needed (pain.)., Disp: , Rfl:    acyclovir (ZOVIRAX) 800 MG tablet, Take 800 mg by mouth 2 (two) times daily as needed (cold sores)., Disp: , Rfl:    albuterol (VENTOLIN HFA) 108 (90 Base) MCG/ACT inhaler, Inhale 2 puffs into the lungs every 6 (six) hours as needed for wheezing or shortness of breath., Disp: , Rfl:    amLODipine (NORVASC) 5 MG tablet, Take 5 mg by mouth every evening., Disp: , Rfl:    Ascorbic Acid (VITAMIN C PO), Take 500 mg by mouth in the morning., Disp: , Rfl:    benazepril-hydrochlorthiazide (LOTENSIN HCT) 20-25 MG tablet, Take 1 tablet by mouth in the morning., Disp: , Rfl:    benzonatate (TESSALON) 200 MG capsule, Take 200 mg by mouth 2 (two) times daily as needed for cough., Disp: , Rfl:    BIOTIN PO, Take 1 tablet by mouth in the morning., Disp: , Rfl:    Cholecalciferol (VITAMIN D3 PO), Take 2,000 Units by mouth in the morning and at bedtime., Disp: , Rfl:    docusate sodium (COLACE) 100 MG capsule, Take 100 mg by mouth 2 (two) times daily., Disp: , Rfl:    doxylamine, Sleep, (UNISOM) 25 MG tablet, Take 12.5 mg by mouth at bedtime., Disp: , Rfl:    levothyroxine (SYNTHROID, LEVOTHROID) 75 MCG tablet, Take 75  mcg by mouth daily before breakfast., Disp: , Rfl:    MAGNESIUM PO, Take 1 tablet by mouth every evening., Disp: , Rfl:    Multiple Vitamin (MULTIVITAMIN WITH MINERALS) TABS tablet, Take 1 tablet by mouth in the morning., Disp: , Rfl:    Omega-3 Fatty Acids (FISH OIL PO), Take 1 capsule by mouth in the morning., Disp: , Rfl:    Polyethyl Glycol-Propyl Glycol (SYSTANE ULTRA) 0.4-0.3 % SOLN, Place 1-2 drops into both eyes 3 (three) times daily as needed (dry/irritated  eyes.)., Disp: , Rfl:    polyethylene glycol (MIRALAX / GLYCOLAX) packet, Take 11.3333 g by mouth at bedtime. 2/3 capful, Disp: , Rfl:    simvastatin (ZOCOR) 20 MG tablet, Take 20 mg by mouth at bedtime. , Disp: , Rfl:    tacrolimus (PROTOPIC) 0.1 % ointment, Apply topically to the vulva twice daily (per Dr. Pricilla Holm), Disp: 30 g, Rfl: 1   TURMERIC PO, Take 1 capsule by mouth every evening., Disp: , Rfl:   Review of Systems: Denies appetite changes, fevers, chills, fatigue, unexplained weight changes. Denies hearing loss, neck lumps or masses, mouth sores, ringing in ears or voice changes. Denies cough or wheezing.  Denies shortness of breath. Denies chest pain or palpitations. Denies leg swelling. Denies abdominal distention, pain, blood in stools, constipation, diarrhea, nausea, vomiting, or early satiety. Denies pain with intercourse, dysuria, frequency, hematuria or incontinence. Denies hot flashes, pelvic pain, vaginal bleeding or vaginal discharge.   Denies joint pain, back pain or muscle pain/cramps. Denies itching, rash, or wounds. Denies dizziness, headaches, numbness or seizures. Denies swollen lymph nodes or glands, denies easy bruising or bleeding. Denies anxiety, depression, confusion, or decreased concentration.  Physical Exam: BP (!) 166/80 (BP Location: Left Arm, Patient Position: Sitting) Comment: Notified RN  Pulse 65   Temp 99.2 F (37.3 C) (Oral)   Resp 20   Wt 154 lb 9.6 oz (70.1 kg)   SpO2 100%   BMI 25.73 kg/m  General: Alert, oriented, no acute distress. HEENT: Normocephalic, atraumatic, sclera anicteric. Chest: Unlabored breathing on room air.   GU: No amount of the genitalia noted.  Some areas of leukoplakia along the posterior vulva without atypical vasculature or ulcerations.  There is a 2-3 cm fleshy, slightly raised lesion along the posterior perineum at 6:00, just anterior to the anus, new since her last visit.  This area is not friable.  Biopsy  performed today.  The vagina itself is almost closed.  On 1 finger digit exam, no nodularity appreciated although I am only able to palpate approximately 1-2 cm.  Vulvar biopsy procedure Preoperative diagnosis: Vulvar lesion Postoperative diagnosis: Same as above Physician: Pricilla Holm MD Estimated blood loss: Minimal Specimens: Vulvar biopsy at 6:00 Procedure: After the procedure was discussed with the patient including risks and benefits, she gave verbal consent.  He was already in dorsal lithotomy position with the lesion visualized.  With her permission, picture was taken and placed in media.  The vulvar lesion was then cleansed with Betadine x 3.  Injected for local anesthesia.  After adequate time was given, Tischler forceps were used to take a biopsy from the posterior edge of the lesion.  This was placed in formalin.  Over nitrate was used to achieve hemostasis.  Overall the patient tolerated the procedure well.    Laboratory & Radiologic Studies: 09/22/22: PET 1. Left inguinal nodes which demonstrate mild increase in hypermetabolism. 1 has minimally enlarged. These are again favored to be reactive but warrant follow-up attention.  2. Otherwise, no evidence of recurrent or metastatic disease, status post hysterectomy and abdominopelvic node dissection. 3. Incidental findings, including: Coronary artery atherosclerosis. Aortic Atherosclerosis (ICD10-I70.0). Esophageal air fluid level suggests dysmotility or gastroesophageal reflux.  Assessment & Plan: [name] Richards is a 77 y.o. 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  Did not tolerate carboplatin, now on pembrolizumab  setting of a CPS score of 90%. New vulvar lesion on exam today, biopsy performed.   The patient is overall doing very well.  Has had several flares of her lichen sclerosus that are treated with tacrolimus.  She has gotten excellent relief from this.    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    Discussed exam findings today.  Biopsy performed.  I will call her with these results.  22 minutes of total time was spent for this patient encounter, including preparation, face-to-face counseling with the patient and coordination of care, and documentation of the encounter.  Eugene Garnet, MD  Division of Gynecologic Oncology  Department of Obstetrics and Gynecology  [facility]

## 2022-12-06 ENCOUNTER — Other Ambulatory Visit: Payer: Self-pay

## 2022-12-09 ENCOUNTER — Telehealth: Payer: Self-pay | Admitting: *Deleted

## 2022-12-09 LAB — SURGICAL PATHOLOGY

## 2022-12-09 NOTE — Telephone Encounter (Signed)
Spoke with [name] Richards and relayed message from [name] Mccreedy, NP that her procedure has been scheduled with [name] Richards at the [facility] on November 7th. 2024. The surgery center will given you a call when it gets closer to the date to discuss times. Right now it is scheduled to start at 11:25 am, always subject to change if needed. Pt verbalized understanding.

## 2022-12-09 NOTE — Telephone Encounter (Signed)
-----   Message from Doylene Bode sent at 12/09/2022  2:30 PM EDT ----- Can you please call her and let her know her procedure has been scheduled with Dr. Pricilla Holm at the [facility] on December 18, 2022. She will get a call from the surgery center closer to the date to discuss times etc. Right now it is scheduled to start around 11:25 am. Always subject to change if needed

## 2022-12-10 ENCOUNTER — Telehealth: Payer: Self-pay | Admitting: Gynecologic Oncology

## 2022-12-10 NOTE — Telephone Encounter (Signed)
Called patient, discussed biopsy results. Given location and size of lesion, I recommend proceeding to OR for surgical excision in the next 1-2 weeks. Patient amenable. Will ask the office to call her to coordinate timing.  Eugene Garnet MD Gynecologic Oncology

## 2022-12-15 ENCOUNTER — Encounter (HOSPITAL_BASED_OUTPATIENT_CLINIC_OR_DEPARTMENT_OTHER): Payer: Self-pay | Admitting: Gynecologic Oncology

## 2022-12-15 ENCOUNTER — Other Ambulatory Visit: Payer: Self-pay

## 2022-12-15 NOTE — Progress Notes (Signed)
Spoke w/ via phone for pre-op interview---pt Lab needs dos----   I stat      Lab results------EKG 02-06-2022 epic COVID test -----patient states asymptomatic no test needed Arrive at -------925 12-18-2015 NPO after MN NO Solid Food.  Clear liquids from MN until---825 Med rec completed Medications to take morning of surgery -----levothyroxine Diabetic medication -----n/a Patient instructed no nail polish to be worn day of surgery Patient instructed to bring photo id and insurance card day of surgery Patient aware to have Driver (ride ) / caregiver    for 24 hours after surgery -friend brenda rohner  Patient Special Instructions -----none Pre-Op special Instructions -----none Patient verbalized understanding of instructions that were given at this phone interview. Patient denies chest pain, sob, fever, cough at the interview.

## 2022-12-16 DIAGNOSIS — M1712 Unilateral primary osteoarthritis, left knee: Secondary | ICD-10-CM | POA: Diagnosis not present

## 2022-12-17 ENCOUNTER — Telehealth: Payer: Self-pay | Admitting: *Deleted

## 2022-12-17 NOTE — Telephone Encounter (Signed)
Telephone call to check on pre-operative status.  Patient compliant with pre-operative instructions.  Reinforced nothing to eat after midnight. Clear liquids until 0830. Patient to arrive at 0930.  No questions or concerns voiced.  Instructed to call for any needs.  ?

## 2022-12-18 ENCOUNTER — Ambulatory Visit (HOSPITAL_BASED_OUTPATIENT_CLINIC_OR_DEPARTMENT_OTHER): Payer: Medicare Other | Admitting: Anesthesiology

## 2022-12-18 ENCOUNTER — Ambulatory Visit (HOSPITAL_BASED_OUTPATIENT_CLINIC_OR_DEPARTMENT_OTHER)
Admission: RE | Admit: 2022-12-18 | Discharge: 2022-12-18 | Disposition: A | Discharge status: Home / Self Care | Payer: Medicare Other | Attending: Gynecologic Oncology | Admitting: Gynecologic Oncology

## 2022-12-18 ENCOUNTER — Encounter (HOSPITAL_BASED_OUTPATIENT_CLINIC_OR_DEPARTMENT_OTHER)
Admission: RE | Disposition: A | Discharge status: Home / Self Care | Payer: Self-pay | Source: Home / Self Care | Attending: Gynecologic Oncology

## 2022-12-18 ENCOUNTER — Other Ambulatory Visit: Payer: Self-pay

## 2022-12-18 ENCOUNTER — Encounter (HOSPITAL_BASED_OUTPATIENT_CLINIC_OR_DEPARTMENT_OTHER): Payer: Self-pay | Admitting: Gynecologic Oncology

## 2022-12-18 DIAGNOSIS — Z79899 Other long term (current) drug therapy: Secondary | ICD-10-CM | POA: Diagnosis not present

## 2022-12-18 DIAGNOSIS — L439 Lichen planus, unspecified: Secondary | ICD-10-CM | POA: Insufficient documentation

## 2022-12-18 DIAGNOSIS — M1990 Unspecified osteoarthritis, unspecified site: Secondary | ICD-10-CM | POA: Diagnosis not present

## 2022-12-18 DIAGNOSIS — I10 Essential (primary) hypertension: Secondary | ICD-10-CM | POA: Diagnosis not present

## 2022-12-18 DIAGNOSIS — C519 Malignant neoplasm of vulva, unspecified: Secondary | ICD-10-CM | POA: Diagnosis present

## 2022-12-18 DIAGNOSIS — J45909 Unspecified asthma, uncomplicated: Secondary | ICD-10-CM | POA: Diagnosis not present

## 2022-12-18 DIAGNOSIS — E039 Hypothyroidism, unspecified: Secondary | ICD-10-CM | POA: Diagnosis not present

## 2022-12-18 DIAGNOSIS — C44520 Squamous cell carcinoma of anal skin: Secondary | ICD-10-CM | POA: Diagnosis not present

## 2022-12-18 DIAGNOSIS — Z01818 Encounter for other preprocedural examination: Secondary | ICD-10-CM

## 2022-12-18 DIAGNOSIS — L900 Lichen sclerosus et atrophicus: Secondary | ICD-10-CM | POA: Diagnosis not present

## 2022-12-18 HISTORY — PX: VULVECTOMY: SHX1086

## 2022-12-18 LAB — POCT I-STAT, CHEM 8
BUN: 12 mg/dL (ref 8–23)
Calcium, Ion: 1.23 mmol/L (ref 1.15–1.40)
Chloride: 95 mmol/L — ABNORMAL LOW (ref 98–111)
Creatinine, Ser: 0.6 mg/dL (ref 0.44–1.00)
Glucose, Bld: 115 mg/dL — ABNORMAL HIGH (ref 70–99)
HCT: 35 % — ABNORMAL LOW (ref 36.0–46.0)
Hemoglobin: 11.9 g/dL — ABNORMAL LOW (ref 12.0–15.0)
Potassium: 3.5 mmol/L (ref 3.5–5.1)
Sodium: 132 mmol/L — ABNORMAL LOW (ref 135–145)
TCO2: 23 mmol/L (ref 22–32)

## 2022-12-18 SURGERY — WIDE EXCISION VULVECTOMY
Anesthesia: General | Site: Vagina

## 2022-12-18 MED ORDER — ACETAMINOPHEN 500 MG PO TABS
1000.0000 mg | ORAL_TABLET | ORAL | Status: AC
Start: 2022-12-18 — End: 2022-12-18
  Administered 2022-12-18: 1000 mg via ORAL

## 2022-12-18 MED ORDER — WHITE PETROLATUM EX OINT
TOPICAL_OINTMENT | CUTANEOUS | Status: AC
  Filled 2022-12-18: qty 5

## 2022-12-18 MED ORDER — FENTANYL CITRATE (PF) 250 MCG/5ML IJ SOLN
INTRAMUSCULAR | Status: DC | PRN
  Administered 2022-12-18: 50 ug via INTRAVENOUS
  Administered 2022-12-18 (×2): 25 ug via INTRAVENOUS

## 2022-12-18 MED ORDER — BUPIVACAINE LIPOSOME 1.3 % IJ SUSP
INTRAMUSCULAR | Status: DC | PRN
  Administered 2022-12-18: 30 mL via SURGICAL_CAVITY

## 2022-12-18 MED ORDER — ONDANSETRON HCL 4 MG/2ML IJ SOLN
INTRAMUSCULAR | Status: DC | PRN
  Administered 2022-12-18: 4 mg via INTRAVENOUS

## 2022-12-18 MED ORDER — ONDANSETRON HCL 4 MG/2ML IJ SOLN: 4.0000 mg | Freq: Four times a day (QID) | INTRAMUSCULAR | Status: DC | PRN

## 2022-12-18 MED ORDER — FENTANYL CITRATE (PF) 100 MCG/2ML IJ SOLN: 25.0000 ug | INTRAMUSCULAR | Status: DC | PRN

## 2022-12-18 MED ORDER — FENTANYL CITRATE (PF) 100 MCG/2ML IJ SOLN
INTRAMUSCULAR | Status: AC
  Filled 2022-12-18: qty 2

## 2022-12-18 MED ORDER — PROPOFOL 10 MG/ML IV BOLUS
INTRAVENOUS | Status: AC
  Filled 2022-12-18: qty 20

## 2022-12-18 MED ORDER — SODIUM CHLORIDE 0.9 % IV SOLN: INTRAVENOUS | Status: DC | PRN

## 2022-12-18 MED ORDER — ACETAMINOPHEN 500 MG PO TABS
ORAL_TABLET | ORAL | Status: AC
  Filled 2022-12-18: qty 2

## 2022-12-18 MED ORDER — ONDANSETRON HCL 4 MG PO TABS: 4.0000 mg | ORAL_TABLET | Freq: Four times a day (QID) | ORAL | Status: DC | PRN

## 2022-12-18 MED ORDER — LIDOCAINE 2% (20 MG/ML) 5 ML SYRINGE
INTRAMUSCULAR | Status: DC | PRN
  Administered 2022-12-18: 40 mg via INTRAVENOUS

## 2022-12-18 MED ORDER — TRAMADOL HCL 50 MG PO TABS
50.0000 mg | ORAL_TABLET | Freq: Four times a day (QID) | ORAL | Status: DC | PRN
Start: 2022-12-18 — End: 2022-12-18

## 2022-12-18 MED ORDER — LACTATED RINGERS IV SOLN: INTRAVENOUS | Status: DC

## 2022-12-18 MED ORDER — TRAMADOL HCL 50 MG PO TABS: 50.0000 mg | ORAL_TABLET | Freq: Four times a day (QID) | ORAL | 0 refills | Status: DC | PRN

## 2022-12-18 MED ORDER — PROPOFOL 10 MG/ML IV BOLUS
INTRAVENOUS | Status: DC | PRN
  Administered 2022-12-18: 130 mg via INTRAVENOUS

## 2022-12-18 MED ORDER — DEXAMETHASONE SODIUM PHOSPHATE 10 MG/ML IJ SOLN: 4.0000 mg | INTRAMUSCULAR | Status: DC

## 2022-12-18 SURGICAL SUPPLY — 26 items
BLADE CLIPPER SENSICLIP SURGIC (BLADE) IMPLANT
BLADE SURG 15 STRL LF DISP TIS (BLADE) ×1 IMPLANT
BLADE SURG 15 STRL SS (BLADE) ×1
CATH ROBINSON RED A/P 16FR (CATHETERS) ×1 IMPLANT
GAUZE 4X4 16PLY ~~LOC~~+RFID DBL (SPONGE) IMPLANT
GLOVE BIO SURGEON STRL SZ 6 (GLOVE) ×2 IMPLANT
GOWN STRL REUS W/TWL LRG LVL3 (GOWN DISPOSABLE) ×1 IMPLANT
KIT TURNOVER CYSTO (KITS) ×1 IMPLANT
NDL HYPO 25X1 1.5 SAFETY (NEEDLE) ×1 IMPLANT
NEEDLE HYPO 25X1 1.5 SAFETY (NEEDLE) ×1
NS IRRIG 500ML POUR BTL (IV SOLUTION) ×1 IMPLANT
PACK PERINEAL COLD (PAD) ×1 IMPLANT
PACK VAGINAL WOMENS (CUSTOM PROCEDURE TRAY) ×1 IMPLANT
PUNCH BIOPSY DERMAL 3 (INSTRUMENTS) IMPLANT
PUNCH BIOPSY DERMAL 4MM (INSTRUMENTS) IMPLANT
SLEEVE SCD COMPRESS KNEE MED (STOCKING) ×1 IMPLANT
SUT VIC AB 0 SH 27 (SUTURE) ×1 IMPLANT
SUT VIC AB 2-0 CT2 27 (SUTURE) IMPLANT
SUT VIC AB 2-0 SH 27 (SUTURE)
SUT VIC AB 2-0 SH 27XBRD (SUTURE) IMPLANT
SUT VIC AB 3-0 SH 27 (SUTURE) ×1
SUT VIC AB 3-0 SH 27X BRD (SUTURE) ×1 IMPLANT
SUT VIC AB 4-0 PS2 18 (SUTURE) ×1 IMPLANT
SWAB OB GYN 8IN STERILE 2PK (MISCELLANEOUS) IMPLANT
SYR BULB IRRIG 60ML STRL (SYRINGE) ×1 IMPLANT
TOWEL OR 17X24 6PK STRL BLUE (TOWEL DISPOSABLE) ×1 IMPLANT

## 2022-12-18 NOTE — Op Note (Signed)
Operative Report  [name] DATE: 12/18/22  Preop Diagnosis: Recurrent SCC of the vulva  Postoperative Diagnosis: same as above  Surgery: Partial simple left vulvectomy  Surgeon: Eugene Garnet, MD  Assistant: Warner Mccreedy NP  Anesthesia: LMA  Estimated blood loss: 25 ml  IVF:  see I&O flowsheet   Urine output: n/a   Complications: None apparent  Pathology: Left perineum with marking stitch at 12 o'clock  Operative findings: 2.5 x 1.5 cm raised flesh colored lesion on the left aspect of the perineum just lateral to midline. Lesion more than 3 cm from the anus. Lichen sclerosus changes of the vulva noted, no other visible lesions. Vaginal opening just barely larger than the urethra, not able to pass a finger into shortened vagina. On rectal exam no nodularity or masses appreciated in the rectovaginal septum.     Procedure: The patient was identified in the preoperative holding area. Informed consent was signed on the chart. Patient was seen history was reviewed and exam was performed.   The patient was then taken to the operating room and placed in the supine position with SCD hose on. General anesthesia was then induced without difficulty. She was then placed in the dorsolithotomy position. The perineum was prepped with Betadine. The vagina was prepped with Betadine. The patient was then draped after the prep was dried. A Foley catheter was inserted into the bladder under sterile conditions.  Timeout was performed the patient, procedure, antibiotic, allergy, and length of procedure. The vulvar tissues were inspected with findings above. The lesion was identified and the marking pen was used to circumscribe the area with appropriate surgical margins. The bovie was used to make an incision through the skin circumferentially as marked. The skin elipse was grasped and was separated from the underlying deep dermal tissues with the bovie device. After the  specimen had been completely resected, it was oriented and marked at 12 o'clock with a 0-vicryl suture. The bovie was used to obtain hemostasis at the surgical bed. The subcutaneous tissues were irrigated and made hemostatic.   The skin was mobilized from the surrounding subcutaneous tissue. The deep dermal layer was approximated with 2-0 and 3-0 vicryl mattress sutures to bring the skin edges into approximation and off tension. The wound was closed following langher's lines. The cutaneous layer was closed with interrupted 4-0 vicryl stitches and mattress sutures to ensure a tension free and hemostatic closure. The perineum was again irrigated. Exparel with 0.25% marcaine was injected for local anesthesia.   All instrument, suture, laparotomy, Ray-Tec, and needle counts were correct x2. The patient tolerated the procedure well and was taken recovery room in stable condition.   Eugene Garnet MD Gynecologic Oncology

## 2022-12-18 NOTE — Discharge Instructions (Addendum)
AFTER SURGERY INSTRUCTIONS   We recommend purchasing several bags of frozen green peas and dividing them into ziploc bags. You will want to keep these in the freezer and have them ready to use as ice packs to the vulvar incision. Once the ice pack is no longer cold, you can get another from the freezer. The frozen peas mold to your body better than a regular ice pack.    Activity: 1. Be up and out of the bed during the day.  Take a nap if needed.  You may walk up steps but be careful and use the hand rail.  Stair climbing will tire you more than you think, you may need to stop part way and rest.    2. No lifting or straining for 2 weeks minimum, ideally 4 weeks over 10 pounds. No pushing, pulling, straining for 2 weeks.   3. No driving for minimum 24 hours after surgery.  Do not drive if you are taking narcotic pain medicine and make sure that your reaction time has returned.    4. You can shower as soon as the next day after surgery. Shower daily. No tub baths or submerging your body in water until cleared by your surgeon. If you have the soap that was given to you by pre-surgical testing that was used before surgery, you do not need to use it afterwards because this can irritate your incisions.    5. No sexual activity and nothing in the vagina for 4 weeks.   6. You may experience vaginal spotting and discharge after surgery.  The spotting is normal but if you experience heavy bleeding, call our office.   7. Take Tylenol or ibuprofen for pain if you are able to take these medications.  Monitor your Tylenol intake to a max of 4,000 mg in a 24 hour period. You can alternate these medications after surgery.   Diet: 1. Low sodium Heart Healthy Diet is recommended but you are cleared to resume your normal (before surgery) diet after your procedure.   2. It is safe to use a laxative, such as Miralax or Colace, if you have difficulty moving your bowels.    Wound Care: 1. Keep clean and dry.   Shower daily.   Reasons to call the Doctor: Fever - Oral temperature greater than 100.4 degrees Fahrenheit Foul-smelling vaginal discharge Difficulty urinating Nausea and vomiting Increased pain at the site of the incision that is unrelieved with pain medicine. Difficulty breathing with or without chest pain New calf pain especially if only on one side Sudden, continuing increased vaginal bleeding with or without clots.   Contacts: For questions or concerns you should contact:   Dr. Eugene Garnet at [phone]   Warner Mccreedy, NP at [phone]   After Hours: call [phone] and have the GYN Oncologist paged/contacted (after 5 pm or on the weekends).   Messages sent via mychart are for non-urgent matters and are not responded to after hours so for urgent needs, please call the after hours number.  No acetaminophen/Tylenol until after 4:00pm today if needed for pain.    Post Anesthesia Home Care Instructions  Activity: Get plenty of rest for the remainder of the day. A responsible individual must stay with you for 24 hours following the procedure.  For the next 24 hours, DO NOT: -Drive a car -Advertising copywriter -Drink alcoholic beverages -Take any medication unless instructed by your physician -Make any legal decisions or sign important papers.  Meals: Start with liquid foods  such as gelatin or soup. Progress to regular foods as tolerated. Avoid greasy, spicy, heavy foods. If nausea and/or vomiting occur, drink only clear liquids until the nausea and/or vomiting subsides. Call your physician if vomiting continues.  Special Instructions/Symptoms: Your throat may feel dry or sore from the anesthesia or the breathing tube placed in your throat during surgery. If this causes discomfort, gargle with warm salt water. The discomfort should disappear within 24 hours.

## 2022-12-18 NOTE — Interval H&P Note (Signed)
History and Physical Interval Note:  12/18/2022 9:31 AM  [name] Richards  has presented today for surgery, with the diagnosis of recurrent vulvar cancer.  The various methods of treatment have been discussed with the patient and family. After consideration of risks, benefits and other options for treatment, the patient has consented to  Procedure(s): WIDE EXCISION VULVECTOMY with possible vulvar biopsy (N/A) as a surgical intervention.  The patient's history has been reviewed, patient examined, no change in status, stable for surgery.  I have reviewed the patient's chart and labs.  Questions were answered to the patient's satisfaction.     Carver Fila

## 2022-12-18 NOTE — Anesthesia Procedure Notes (Signed)
Procedure Name: LMA Insertion Date/Time: 12/18/2022 11:19 AM  Performed by: Dairl Ponder, CRNAPre-anesthesia Checklist: Patient identified, Emergency Drugs available, Suction available and Patient being monitored Patient Re-evaluated:Patient Re-evaluated prior to induction Oxygen Delivery Method: Circle System Utilized Preoxygenation: Pre-oxygenation with 100% oxygen Induction Type: IV induction Ventilation: Mask ventilation without difficulty LMA: LMA inserted LMA Size: 4.0 Number of attempts: 1 Airway Equipment and Method: Bite block Placement Confirmation: positive ETCO2 Tube secured with: Tape Dental Injury: Teeth and Oropharynx as per pre-operative assessment

## 2022-12-18 NOTE — Anesthesia Preprocedure Evaluation (Signed)
Anesthesia Evaluation  Patient identified by MRN, date of birth, ID band Patient awake    Reviewed: Allergy & Precautions, NPO status , Patient's Chart, lab work & pertinent test results  Airway Mallampati: II     Mouth opening: Limited Mouth Opening  Dental  (+) Teeth Intact, Dental Advisory Given   Pulmonary asthma    breath sounds clear to auscultation       Cardiovascular hypertension, Pt. on medications  Rhythm:Regular Rate:Normal     Neuro/Psych negative neurological ROS  negative psych ROS   GI/Hepatic Neg liver ROS,GERD  ,,  Endo/Other  Hypothyroidism    Renal/GU Renal disease     Musculoskeletal  (+) Arthritis ,    Abdominal   Peds  Hematology  (+) Blood dyscrasia, anemia   Anesthesia Other Findings   Reproductive/Obstetrics                             Anesthesia Physical Anesthesia Plan  ASA: 3  Anesthesia Plan: General   Post-op Pain Management: Tylenol PO (pre-op)*   Induction: Intravenous  PONV Risk Score and Plan: 4 or greater and Ondansetron and Treatment may vary due to age or medical condition  Airway Management Planned: LMA  Additional Equipment: None  Intra-op Plan:   Post-operative Plan: Extubation in OR  Informed Consent: I have reviewed the patients History and Physical, chart, labs and discussed the procedure including the risks, benefits and alternatives for the proposed anesthesia with the patient or authorized representative who has indicated his/her understanding and acceptance.     Dental advisory given  Plan Discussed with: CRNA  Anesthesia Plan Comments:        Anesthesia Quick Evaluation

## 2022-12-18 NOTE — Anesthesia Postprocedure Evaluation (Signed)
Anesthesia Post Note  [name]  Procedure(s) Performed: WIDE EXCISION VULVECTOMY (Vagina )     Patient location during evaluation: PACU Anesthesia Type: General Level of consciousness: awake and alert Pain management: pain level controlled Vital Signs Assessment: post-procedure vital signs reviewed and stable Respiratory status: spontaneous breathing, nonlabored ventilation, respiratory function stable and patient connected to nasal cannula oxygen Cardiovascular status: blood pressure returned to baseline and stable Postop Assessment: no apparent nausea or vomiting Anesthetic complications: no  No notable events documented.  Last Vitals:  Vitals:   12/18/22 1215 12/18/22 1230  BP: (!) 144/70 (!) 145/61  Pulse: (!) 51 (!) 50  Resp: 14 14  Temp:  36.9 C  SpO2: 95% 98%    Last Pain:  Vitals:   12/18/22 1230  TempSrc:   PainSc: 3                  Shelton Silvas

## 2022-12-18 NOTE — Transfer of Care (Signed)
Immediate Anesthesia Transfer of Care Note  [name]  Procedure(s) Performed: WIDE EXCISION VULVECTOMY (Vagina )  Patient Location: PACU  Anesthesia Type:General  Level of Consciousness: awake, alert , and oriented  Airway & Oxygen Therapy: Patient Spontanous Breathing  Post-op Assessment: Report given to RN and Post -op Vital signs reviewed and stable  Post vital signs: Reviewed and stable  Last Vitals:  Vitals Value Taken Time  BP 149/72 12/18/22 1202  Temp    Pulse 51 12/18/22 1205  Resp 10 12/18/22 1205  SpO2 96 % 12/18/22 1205  Vitals shown include unfiled device data.  Last Pain:  Vitals:   12/18/22 1000  TempSrc: Oral  PainSc: 0-No pain      Patients Stated Pain Goal: 5 (12/18/22 1000)  Complications: No notable events documented.

## 2022-12-19 ENCOUNTER — Encounter: Payer: Self-pay | Admitting: *Deleted

## 2022-12-19 ENCOUNTER — Telehealth: Payer: Self-pay | Admitting: *Deleted

## 2022-12-19 NOTE — Telephone Encounter (Signed)
Spoke with Ms. Pera this morning. She states she is eating, drinking and urinating well. She has not had a BM yet but is passing gas. She is taking senokot as prescribed and encouraged her to drink plenty of water. She denies fever or chills. Incisions are dry and intact. She rates her pain 0/10. Pt is not taking anything for pain.  Instructed to call office with any fever, chills, purulent drainage, uncontrolled pain or any other questions or concerns. Patient verbalizes understanding.   Pt aware of post op appointments as well as the office number [phone] and after hours number [phone] to call if she has any questions or concerns

## 2022-12-22 ENCOUNTER — Encounter (HOSPITAL_BASED_OUTPATIENT_CLINIC_OR_DEPARTMENT_OTHER): Payer: Self-pay | Admitting: Gynecologic Oncology

## 2022-12-22 LAB — SURGICAL PATHOLOGY

## 2022-12-25 ENCOUNTER — Telehealth: Payer: Self-pay | Admitting: *Deleted

## 2022-12-25 ENCOUNTER — Inpatient Hospital Stay: Payer: Medicare Other | Attending: Gynecologic Oncology | Admitting: Gynecologic Oncology

## 2022-12-25 NOTE — Telephone Encounter (Signed)
Spoke with Ms. [name] Richards and relayed message from Dr. Pricilla Holm in regards to phone visit at 5 pm, if patient doesn't have any acute concerns we can cancel the appointment if pt prefers. Pt states she is doing well accept for vaginal itching with redness. Pt is rubbing the area because of the intense itching and now it is very red. Pt is also using cold compresses and using a donut cushion while sitting down to prevent anything constricting the vulva area.   Advised patient per Dr. Winferd Humphrey advice she can start using her tacrolimus ointment on all areas of the vulva as needed accept area of site resection. Also advised pt to wear loose fitting cloths as well. Pt verbalized understanding and had no further concerns or questions. Pt agreed to cancel phone visit with Dr. Pricilla Holm.

## 2023-01-15 ENCOUNTER — Inpatient Hospital Stay: Payer: Medicare Other | Attending: Gynecologic Oncology

## 2023-01-15 ENCOUNTER — Encounter: Payer: Self-pay | Admitting: Hematology and Oncology

## 2023-01-15 ENCOUNTER — Inpatient Hospital Stay: Payer: Medicare Other | Admitting: Hematology and Oncology

## 2023-01-15 ENCOUNTER — Inpatient Hospital Stay: Payer: Medicare Other

## 2023-01-15 VITALS — BP 156/60 | HR 70 | Temp 97.5°F | Resp 18 | Ht 65.0 in | Wt 155.0 lb

## 2023-01-15 DIAGNOSIS — C52 Malignant neoplasm of vagina: Secondary | ICD-10-CM

## 2023-01-15 DIAGNOSIS — Z8542 Personal history of malignant neoplasm of other parts of uterus: Secondary | ICD-10-CM | POA: Diagnosis not present

## 2023-01-15 DIAGNOSIS — Z90710 Acquired absence of both cervix and uterus: Secondary | ICD-10-CM | POA: Diagnosis not present

## 2023-01-15 DIAGNOSIS — E039 Hypothyroidism, unspecified: Secondary | ICD-10-CM | POA: Diagnosis not present

## 2023-01-15 DIAGNOSIS — Z90722 Acquired absence of ovaries, bilateral: Secondary | ICD-10-CM | POA: Insufficient documentation

## 2023-01-15 DIAGNOSIS — C519 Malignant neoplasm of vulva, unspecified: Secondary | ICD-10-CM

## 2023-01-15 DIAGNOSIS — L900 Lichen sclerosus et atrophicus: Secondary | ICD-10-CM | POA: Insufficient documentation

## 2023-01-15 DIAGNOSIS — N9089 Other specified noninflammatory disorders of vulva and perineum: Secondary | ICD-10-CM | POA: Diagnosis not present

## 2023-01-15 DIAGNOSIS — Z5112 Encounter for antineoplastic immunotherapy: Secondary | ICD-10-CM | POA: Diagnosis not present

## 2023-01-15 DIAGNOSIS — Z9079 Acquired absence of other genital organ(s): Secondary | ICD-10-CM | POA: Diagnosis not present

## 2023-01-15 DIAGNOSIS — Z923 Personal history of irradiation: Secondary | ICD-10-CM | POA: Insufficient documentation

## 2023-01-15 LAB — CBC WITH DIFFERENTIAL (CANCER CENTER ONLY)
Abs Immature Granulocytes: 0.03 10*3/uL (ref 0.00–0.07)
Basophils Absolute: 0 10*3/uL (ref 0.0–0.1)
Basophils Relative: 1 %
Eosinophils Absolute: 0.1 10*3/uL (ref 0.0–0.5)
Eosinophils Relative: 2 %
HCT: 37.2 % (ref 36.0–46.0)
Hemoglobin: 11.8 g/dL — ABNORMAL LOW (ref 12.0–15.0)
Immature Granulocytes: 1 %
Lymphocytes Relative: 19 %
Lymphs Abs: 1 10*3/uL (ref 0.7–4.0)
MCH: 21.4 pg — ABNORMAL LOW (ref 26.0–34.0)
MCHC: 31.7 g/dL (ref 30.0–36.0)
MCV: 67.5 fL — ABNORMAL LOW (ref 80.0–100.0)
Monocytes Absolute: 0.4 10*3/uL (ref 0.1–1.0)
Monocytes Relative: 8 %
Neutro Abs: 3.6 10*3/uL (ref 1.7–7.7)
Neutrophils Relative %: 69 %
Platelet Count: 261 10*3/uL (ref 150–400)
RBC: 5.51 MIL/uL — ABNORMAL HIGH (ref 3.87–5.11)
RDW: 15 % (ref 11.5–15.5)
Smear Review: NORMAL
WBC Count: 5.3 10*3/uL (ref 4.0–10.5)
nRBC: 0 % (ref 0.0–0.2)

## 2023-01-15 LAB — CMP (CANCER CENTER ONLY)
ALT: 12 U/L (ref 0–44)
AST: 13 U/L — ABNORMAL LOW (ref 15–41)
Albumin: 4.2 g/dL (ref 3.5–5.0)
Alkaline Phosphatase: 84 U/L (ref 38–126)
Anion gap: 8 (ref 5–15)
BUN: 18 mg/dL (ref 8–23)
CO2: 29 mmol/L (ref 22–32)
Calcium: 9.6 mg/dL (ref 8.9–10.3)
Chloride: 99 mmol/L (ref 98–111)
Creatinine: 0.67 mg/dL (ref 0.44–1.00)
GFR, Estimated: >60 mL/min (ref >60)
Glucose, Bld: 131 mg/dL — ABNORMAL HIGH (ref 70–99)
Potassium: 3.7 mmol/L (ref 3.5–5.1)
Sodium: 136 mmol/L (ref 135–145)
Total Bilirubin: 0.4 mg/dL (ref <1.2)
Total Protein: 6.8 g/dL (ref 6.5–8.1)

## 2023-01-15 LAB — TSH: TSH: 1.83 u[IU]/mL (ref 0.350–4.500)

## 2023-01-15 MED ORDER — SODIUM CHLORIDE 0.9 % IV SOLN
400.0000 mg | Freq: Once | INTRAVENOUS | Status: AC
  Administered 2023-01-15: 400 mg via INTRAVENOUS
  Filled 2023-01-15: qty 16

## 2023-01-15 MED ORDER — SODIUM CHLORIDE 0.9 % IV SOLN: Freq: Once | INTRAVENOUS | Status: AC

## 2023-01-15 NOTE — Progress Notes (Signed)
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 It is tolerable We will continue treatment as scheduled I will defer to her surgeon for timing of her next PET/CT imaging  Lichen sclerosus She continues to have intermittent flare She is coping right now She has pain medicine to take as needed We will observe closely  Orders Placed This Encounter  Procedures   CBC with Differential ([facility] Only)    Standing Status:   Future    Standing Expiration Date:   02/26/2024   CMP ([facility] only)    Standing Status:   Future    Standing Expiration Date:   02/26/2024   T4    Standing Status:   Future    Standing Expiration Date:   02/26/2024   TSH    Standing Status:   Future    Standing Expiration Date:   02/26/2024    All questions were answered. The patient knows to call the clinic with any problems, questions or concerns. The total time spent in the appointment was 30 minutes encounter with patients including review of chart and various tests results, discussions about plan of care and coordination of care plan   [name], MD 01/15/2023 12:10 PM  INTERVAL HISTORY: Please see below for problem oriented charting. she returns for treatment and follow-up She had resection of recurrent vulvar cancer a month ago She is still recovering from it She also have intermittent flare of lichen sclerosis in her mouth Otherwise, she  have no other new side effects from immunotherapy  REVIEW OF SYSTEMS:   Constitutional: Denies fevers, chills or abnormal weight loss Eyes: Denies blurriness of vision Respiratory: Denies cough, dyspnea or wheezes Cardiovascular: Denies palpitation, chest discomfort or lower extremity swelling Gastrointestinal:  Denies nausea, heartburn or change in bowel habits Skin: Denies abnormal skin rashes Lymphatics: Denies new lymphadenopathy or easy bruising Neurological:Denies numbness, tingling or new weaknesses Behavioral/Psych: Mood is stable, no new changes  All other systems were reviewed with the patient and are negative.  I have reviewed the past medical history, past surgical history, social history and family history with the patient and they are unchanged from previous note.  ALLERGIES:  is allergic to penicillins, fentanyl, tape, codeine, compazine [prochlorperazine], percocet [oxycodone-acetaminophen], and trazodone hcl.  MEDICATIONS:  Current Outpatient Medications  Medication Sig Dispense Refill   acetaminophen (TYLENOL) 500 MG tablet Take 500-1,000 mg by mouth every 6 (six) hours as needed (pain.). (Patient not taking: Reported on 12/15/2022)     acyclovir (ZOVIRAX) 800 MG tablet Take 800 mg by mouth 2 (two) times daily as needed (cold sores).     albuterol (VENTOLIN HFA) 108 (90 Base) MCG/ACT inhaler Inhale 2 puffs into the lungs every 6 (six) hours as needed for wheezing or shortness of breath.     amLODipine (NORVASC) 5 MG tablet Take 5 mg by mouth every evening.     Ascorbic Acid (VITAMIN C PO) Take 500 mg by mouth in the morning.  benazepril-hydrochlorthiazide (LOTENSIN HCT) 20-25 MG tablet Take 1 tablet by mouth in the morning.     BIOTIN PO Take 1 tablet by mouth in the morning.     Cholecalciferol (VITAMIN D3 PO) Take 2,000 Units by mouth in the morning and at bedtime.     docusate sodium (COLACE) 100 MG capsule Take 100 mg by mouth 2 (two) times daily.     doxylamine,  Sleep, (UNISOM) 25 MG tablet Take 12.5 mg by mouth at bedtime.     ibuprofen (ADVIL) 400 MG tablet Take 400 mg by mouth every 6 (six) hours as needed.     levothyroxine (SYNTHROID, LEVOTHROID) 75 MCG tablet Take 75 mcg by mouth daily before breakfast.     MAGNESIUM PO Take 1 tablet by mouth every evening.     Multiple Vitamin (MULTIVITAMIN WITH MINERALS) TABS tablet Take 1 tablet by mouth in the morning.     Omega-3 Fatty Acids (FISH OIL PO) Take 1 capsule by mouth in the morning.     Pembrolizumab (KEYTRUDA IV) Inject into the vein. Q 6 weeks     Polyethyl Glycol-Propyl Glycol (SYSTANE ULTRA) 0.4-0.3 % SOLN Place 1-2 drops into both eyes 3 (three) times daily as needed (dry/irritated eyes.).     polyethylene glycol (MIRALAX / GLYCOLAX) packet Take 11.3333 g by mouth at bedtime. 2/3 capful     simvastatin (ZOCOR) 20 MG tablet Take 20 mg by mouth at bedtime.      tacrolimus (PROTOPIC) 0.1 % ointment Apply topically to the vulva twice daily (per Dr. Pricilla Holm) (Patient taking differently: Apply topically 2 (two) times daily as needed.) 30 g 1   traMADol (ULTRAM) 50 MG tablet Take 1 tablet (50 mg total) by mouth every 6 (six) hours as needed for severe pain (pain score 7-10). For AFTER surgery only, do not take and drive 10 tablet 0   TURMERIC PO Take 1 capsule by mouth every evening.     No current facility-administered medications for this visit.    SUMMARY OF ONCOLOGIC HISTORY: Oncology History Overview Note  Patient followed due to HX pulmonary nodules noted 2011  Primary cancer of left upper lobe of lung (HCC)   Staging form: Lung, AJCC 7th Edition   - Clinical stage from 09/27/2015: Stage IA (T1b, N0, M0) - Signed by Si Gaul, MD on 09/27/2015  PDL-1 90%    Primary cancer of left upper lobe of lung (HCC)  07/12/2015 Imaging   CT Chest IMPRESSION:  The solid component of the large part solid nodule in the left upper lobe measures 9 x 3 mm (mean diameter 6 mm.) persistent part solid  nodules with solid components greater than or equal to 6 mm should be considered highly suspicious for pulmonary adenocarcinoma   08/01/2015 Initial Diagnosis   Primary cancer of left upper lobe of lung (HCC)   08/01/2015 Surgery   PROCEDURE:  Left video-assisted thoracoscopy,           Wedge resection,           Thoracoscopic lingular sparing left upper lobectomy           Lymph node dissection           On-Q local anesthetic catheter placement   08/01/2015 Pathology Results   Lung, resection (segmental or lobe), Left Upper Lobe - ADENOCARCINOMA, WELL DIFFERENTIATED, SPANNING 2.5 CM. - THE SURGICAL RESECTION MARGINS ARE NEGATIVE FOR CARCINOMA. - THERE IS NO EVIDENCE OF CARCINOMA IN 1 OF 1 LYMPH NODE (  Vulvar cancer (HCC)  11/01/2018 Initial Diagnosis   Vulvar cancer   06/03/2022 Cancer Staging   Staging form: Vulva, AJCC 8th Edition - Pathologic stage from 06/03/2022: Stage IB (pT1b, pN0, cM0) - Signed by [name], MD on 06/03/2022 Stage prefix: Initial diagnosis   12/18/2022 Pathology Results   SURGICAL PATHOLOGY CASE: [id number] [name] Surgical Pathology Report  Clinical History: Recurrent vulvar cancer (crm)  FINAL MICROSCOPIC DIAGNOSIS:  A. 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 She underwent a total abdominal hysterectomy and bilateral salpingo-oophorectomy, pelvic and para-aortic lymphadenectomy. She received postoperative whole pelvis radiation therapy.    02/06/2015 Surgery   In  December 2016 the patient was found to have a new primary squamous cell carcinoma the vulva undergoing a modified radical vulvectomy on 02/06/2015. She underwent a modified radical vulvectomy at [facility] on 02/06/2015. She was found to have a 3 cm vulvar lesion of the posterior vulva extending into the posterior vagina. A rhomboid flap was required to close the incision.  Final pathology showed some close surgical margins. However, the specimen margins were toward the time of surgery and it was Dr Nelwyn Salisbury impression that they were widely around the primary cancer. She discussed options with the patient and they agreed to monitor her closely but not recommend any radiation therapy to the vulva.   01/13/2017 Surgery   She experienced recurrent vulvar cancer October 2018 managed by a small modified radical vulvectomy of the right side on January 13, 2017.  Depth of invasion was 4 mm all margins were negative    03/13/2017 PET scan   1. No evidence of recurrent or metastatic disease. 2. Aortic atherosclerosis (ICD10-170.0). Coronary artery calcification.   07/20/2019 Miscellaneous   Examination of the vulva during routine surveillance on 07/20/19 showed a friable, verrucous lesion measuring approximately 2 cm was seen on the anterior wall of the suburethral vagina extending to the introitus and wrapping around to the right mid vagina. This was concerning for recurrence and was biopsied at that appointment. 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    08/11/2019 Miscellaneous   On 08/11/19 she underwent total vaginectomy. Intraoperative findings were significant for a forshortened 3cm vagina, friable 4cm plaque on the anterior vagina extending to the right fornix and left posterior vaginal wall. This necessitated a total vaginectomy to resect the lesion grossly. Her tissues were extremely friable due to age, prior radiation, menopause and lichen sclerosis. There was unavoidable fragmentation of the tissues  during the resection. All gross visible disease was removed. Surgery was uncomplicated, and she reported no postop pain.  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 The suspicious foci are present at the edges of tissue fragments.  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        09/13/2019 PET scan   1. No specific evidence of recurrent vulvar carcinoma or distant metastases. There is nonspecific activity in the region of  the vaginal introitus which could reflect urine contamination or vaginal recurrence. Correlation with physical examination recommended.  2. No abnormal thoracic activity status post left upper lobe wedge resection. 3. Coronary and Aortic Atherosclerosis (ICD10-I70.0).   09/29/2019 - 10/12/2019 Radiation Therapy   The patient received adjuvant vaginal brachytherapy between 09/29/2019 through 10/12/2019.     02/28/2021 PET scan   1. Interval decrease in degree of FDG uptake associated with the vaginal introitus. The SUV max on today's study is equal to 5.96 compared with 9.17 previously. 2. No highly concerning features to suggest nodal metastasis or distant metastatic disease. Mildly increased tracer uptake within and morphologically benign left inguinal lymph node is identified which warrants attention on follow-up imaging. 3. Stable postoperative changes within the left upper lobe without signs to suggest locally recurrent tumor. 4. Aortic Atherosclerosis (ICD10-I70.0). Coronary artery calcifications.   03/11/2021 Initial Diagnosis   Recurrent vaginal cancer   03/20/2021 Procedure   03/20/21: Vaginectomy, CO2 laser of the vulva for recurrent vaginal cancer, high-grade vulvar dysplasia.  Vaginal excision showed squamous mucosa with ulceration  and inflammation.  No malignancy or dysplasia identified.  Rare atypical "radiation" fibroblast embedded.    10/24/2021 Imaging   1. No acute abnormality of the abdomen or pelvis. 2. Aortic Atherosclerosis (ICD10-I70.0).   02/19/2022 Surgery   Preop Diagnosis: history of vulvar and vaginal cancer, suspicion for recurrent vulvar cancer   Postoperative Diagnosis: same as above   Surgery: Partial simple right/posterior vulvectomy with rotational rhomboid flap    Surgeons: Carin Hock MD    Pathology: posterior vulva (6-8 o'c) with stitch at 12, additional superior, lateral and inferior margins, deep margin   Operative findings: 2 x 2.5 cm verrucous appearing lesion along the perineum and extending to the introitus, relatively broad 2 cm base. Given size of defect and its location, required rotational flap to close defect. Hyperkeratotic areas within the significantly foreshortened vagina.   03/13/2022 PET scan    ADDENDUM REPORT: 03/14/2022 08:44   ADDENDUM: I just discussed this study with Dr. Pricilla Holm. The patient had surgery about 3 weeks ago for recurrent disease at the vaginal introitus with no residual disease apparent clinically. Additionally, the patient has had no chemotherapy in the 1 year interval since the prior study and no radiation therapy to the left groin region. In light of this additional history, the changes in the region the vaginal introitus today may well simply represent urinary contaminant rather than residual disease. Further, given lack of interval systemic chemotherapy and no interval focused therapy to the left groin lymph node, the interval stability in this node (9 mm previously versus 10 mm today) and stable low level FDG accumulation ( SUV max = 2.9 today compared to 3.0 previously) would suggest a reactive etiology as more likely, especially in light of the recurrent surgeries to the external genitalia.     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  Approximately 1 x 1 cm raised mass along the right distal vagina from 6-8 o'clock.  Second raised mass along the left vaginal sidewall extending almost up to the  vaginal apex measuring approximately 1 x 1.5 cm.  Several small, less than 5 mm, masses along the vaginal apex and superior wall of the posterior vagina.  Well-healed lumbar incision from recent surgery.  Significant loss of architecture of the vulva with erythema noted of bilateral anterior labia and along the perineum.  Multiple biopsies taken including 2 and 10:00 along the vulva and at 5:00 along the posterior vulva.     05/07/2022 Pathology Results   CASE: [id number] [name] Surgical Pathology Report  Clinical History: Recurrent vulvar cancer (crm)  FINAL MICROSCOPIC DIAGNOSIS:  A. VAGINAL LESION, DISTAL RIGHT, EXCISION:      Invasive squamous cell carcinoma.      Cauterized carcinoma present at the mucosal margin. Deep margin are negative for carcinoma.  B. VAGINAL SIDEWALL, LEFT, EXCISION:      Invasive squamous cell carcinoma.      Cauterized carcinoma present at the mucosal margin. Deep margin are negative for carcinoma.  C. VAGINA, APEX, EXCISION:      At least squamous cell carcinoma in situ.  D. VAGINA, POSTERIOR, BIOPSY:      At least squamous cell carcinoma in situ.  E. VULVA, RIGHT, BIOPSY:      Squamous mucosa with acute inflammation and reactive epithelial changes.      Negative for dysplasia or malignancy.  F. VULVA, LEFT, BIOPSY:       Squamous mucosa with acute and chronic inflammation and reactive epithelial changes.      Negative for dysplasia or malignancy.  G. VULVA, POSTERIOR, 5:00, BIOPSY:      Squamous mucosa with acute and chronic inflammation and reactive epithelial changes.      Negative for dysplasia or malignancy.     06/03/2022 Cancer Staging   Staging form: Vagina, AJCC 8th Edition - Clinical: cT1, cN0, cM0 - Signed by [name], MD on 06/03/2022   06/09/2022 - 06/09/2022 Chemotherapy   Patient is on Treatment Plan : UTERINE Pembrolizumab (200) q21d     06/09/2022 - 06/09/2022 Chemotherapy   Patient is on Treatment Plan : UTERINE Carboplatin (AUC 5) q21d     06/30/2022 -  Chemotherapy   Patient is on Treatment Plan : UTERINE Pembrolizumab (400) q42d     09/26/2022 PET scan   NM PET Image Restage (PS) Skull Base to Thigh (F-18 FDG)  Result Date: 09/25/2022 CLINICAL DATA:  Subsequent treatment strategy for recurrent vaginal cancer. Currently on immunotherapy. Biopsy 05/07/2022. EXAM: NUCLEAR MEDICINE PET SKULL BASE TO THIGH TECHNIQUE: 7.7 mCi F-18 FDG was injected intravenously. Full-ring PET imaging was performed from the skull base to thigh after the radiotracer. CT data was obtained and used for attenuation correction and anatomic localization. Fasting blood glucose: 107 mg/dl COMPARISON:  91/47/8295 FINDINGS: Mediastinal blood pool activity: SUV max 2.2 Liver activity: SUV max NA NECK: No areas of abnormal hypermetabolism. Incidental CT findings: Cerebral atrophy. No cervical adenopathy. Bilateral carotid atherosclerosis. CHEST: No pulmonary parenchymal or thoracic nodal hypermetabolism. Incidental CT findings: Surgical changes about the left hilum and left upper lobe consistent with clinical history of prior wedge resection. Aortic and coronary artery calcification. Esophageal fluid level on 60/4. 3 mm right lower lobe pulmonary nodule on 78/4 is unchanged. ABDOMEN/PELVIS: No abdominopelvic parenchymal  hypermetabolism. Abdominopelvic nodal dissection. Mildly hypermetabolic left inguinal nodes. The more cephalad measures 8 mm and a S.U.V. max of 2.6 on 168/4 versus 6 mm and a S.U.V. max of 1.8 on the prior. More caudal measures 1.0 cm and a S.U.V. max of 3.7  on 173/4 versus similar in size and a S.U.V. max of 2.9 on the prior. Incidental CT findings: Scarred upper pole right kidney. Normal adrenal glands. Abdominal aortic atherosclerosis. Hysterectomy. Pelvic floor laxity with mild cystocele. SKELETON: No abnormal marrow activity. Incidental CT findings: Right-greater-than-left hip osteoarthritis. IMPRESSION: 1. Left inguinal nodes which demonstrate mild increase in hypermetabolism. 1 has minimally enlarged. These are again favored to be reactive but warrant follow-up attention. 2. Otherwise, no evidence of recurrent or metastatic disease, status post hysterectomy and abdominopelvic node dissection. 3. Incidental findings, including: Coronary artery atherosclerosis. Aortic Atherosclerosis (ICD10-I70.0). Esophageal air fluid level suggests dysmotility or gastroesophageal reflux. Electronically Signed   By: Jeronimo Greaves M.D.   On: 09/25/2022 14:57        PHYSICAL EXAMINATION: ECOG PERFORMANCE STATUS: 1 - Symptomatic but completely ambulatory  Vitals:   01/15/23 1142  BP: (!) 156/60  Pulse: 70  Resp: 18  Temp: (!) 97.5 F (36.4 C)  SpO2: 100%   Filed Weights   01/15/23 1142  Weight: 155 lb (70.3 kg)    GENERAL:alert, no distress and comfortable   LABORATORY DATA:  I have reviewed the data as listed    Component Value Date/Time   NA 136 01/15/2023 1116   NA 136 09/18/2016 1536   K 3.7 01/15/2023 1116   K 3.9 09/18/2016 1536   CL 99 01/15/2023 1116   CO2 29 01/15/2023 1116   CO2 26 09/18/2016 1536   GLUCOSE 131 (H) 01/15/2023 1116   GLUCOSE 172 (H) 09/18/2016 1536   BUN 18 01/15/2023 1116   BUN 15.8 09/18/2016 1536   CREATININE 0.67 01/15/2023 1116   CREATININE 0.8 09/18/2016  1536   CALCIUM 9.6 01/15/2023 1116   CALCIUM 9.3 09/18/2016 1536   PROT 6.8 01/15/2023 1116   PROT 6.3 (L) 09/18/2016 1536   ALBUMIN 4.2 01/15/2023 1116   ALBUMIN 3.6 09/18/2016 1536   AST 13 (L) 01/15/2023 1116   AST 18 09/18/2016 1536   ALT 12 01/15/2023 1116   ALT 17 09/18/2016 1536   ALKPHOS 84 01/15/2023 1116   ALKPHOS 91 09/18/2016 1536   BILITOT 0.4 01/15/2023 1116   BILITOT 0.42 09/18/2016 1536   GFRNONAA >60 01/15/2023 1116   GFRAA >60 08/04/2019 0901   GFRAA >60 09/13/2018 0808    No results found for: "SPEP", "UPEP"  Lab Results  Component Value Date   WBC 5.3 01/15/2023   NEUTROABS PENDING 01/15/2023   HGB 11.8 (L) 01/15/2023   HCT 37.2 01/15/2023   MCV 67.5 (L) 01/15/2023   PLT 261 01/15/2023      Chemistry      Component Value Date/Time   NA 136 01/15/2023 1116   NA 136 09/18/2016 1536   K 3.7 01/15/2023 1116   K 3.9 09/18/2016 1536   CL 99 01/15/2023 1116   CO2 29 01/15/2023 1116   CO2 26 09/18/2016 1536   BUN 18 01/15/2023 1116   BUN 15.8 09/18/2016 1536   CREATININE 0.67 01/15/2023 1116   CREATININE 0.8 09/18/2016 1536      Component Value Date/Time   CALCIUM 9.6 01/15/2023 1116   CALCIUM 9.3 09/18/2016 1536   ALKPHOS 84 01/15/2023 1116   ALKPHOS 91 09/18/2016 1536   AST 13 (L) 01/15/2023 1116   AST 18 09/18/2016 1536   ALT 12 01/15/2023 1116   ALT 17 09/18/2016 1536   BILITOT 0.4 01/15/2023 1116   BILITOT 0.42 09/18/2016 1536

## 2023-01-15 NOTE — Assessment & Plan Note (Signed)
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

## 2023-01-15 NOTE — Assessment & Plan Note (Signed)
She continues to have intermittent flare She is coping right now She has pain medicine to take as needed We will observe closely

## 2023-01-16 LAB — T4: T4, Total: 9.3 ug/dL (ref 4.5–12.0)

## 2023-01-22 ENCOUNTER — Encounter: Payer: Self-pay | Admitting: Gynecologic Oncology

## 2023-01-22 ENCOUNTER — Inpatient Hospital Stay (HOSPITAL_BASED_OUTPATIENT_CLINIC_OR_DEPARTMENT_OTHER): Payer: Medicare Other | Admitting: Gynecologic Oncology

## 2023-01-22 VITALS — BP 160/72 | HR 73 | Temp 99.2°F | Resp 19 | Wt 152.2 lb

## 2023-01-22 DIAGNOSIS — Z9079 Acquired absence of other genital organ(s): Secondary | ICD-10-CM

## 2023-01-22 DIAGNOSIS — C519 Malignant neoplasm of vulva, unspecified: Secondary | ICD-10-CM

## 2023-01-22 DIAGNOSIS — Z923 Personal history of irradiation: Secondary | ICD-10-CM | POA: Diagnosis not present

## 2023-01-22 DIAGNOSIS — E039 Hypothyroidism, unspecified: Secondary | ICD-10-CM | POA: Diagnosis not present

## 2023-01-22 DIAGNOSIS — N9089 Other specified noninflammatory disorders of vulva and perineum: Secondary | ICD-10-CM

## 2023-01-22 DIAGNOSIS — Z5112 Encounter for antineoplastic immunotherapy: Secondary | ICD-10-CM | POA: Diagnosis not present

## 2023-01-22 DIAGNOSIS — L900 Lichen sclerosus et atrophicus: Secondary | ICD-10-CM | POA: Diagnosis not present

## 2023-01-22 NOTE — Progress Notes (Signed)
Gynecologic Oncology Return Clinic Visit  01/22/23  Reason for Visit: follow-up  Treatment History: Oncology History Overview Note  Patient followed due to HX pulmonary nodules noted 2011  Primary cancer of left upper lobe of lung (HCC)   Staging form: Lung, AJCC 7th Edition   - Clinical stage from 09/27/2015: Stage IA (T1b, N0, M0) - Signed by Si Gaul, MD on 09/27/2015  PDL-1 90%    Primary cancer of left upper lobe of lung (HCC)  07/12/2015 Imaging   CT Chest IMPRESSION:  The solid component of the large part solid nodule in the left upper lobe measures 9 x 3 mm (mean diameter 6 mm.) persistent part solid nodules with solid components greater than or equal to 6 mm should be considered highly suspicious for pulmonary adenocarcinoma   08/01/2015 Initial Diagnosis   Primary cancer of left upper lobe of lung (HCC)   08/01/2015 Surgery   PROCEDURE:  Left video-assisted thoracoscopy,           Wedge resection,           Thoracoscopic lingular sparing left upper lobectomy           Lymph node dissection           On-Q local anesthetic catheter placement   08/01/2015 Pathology Results   Lung, resection (segmental or lobe), Left Upper Lobe - ADENOCARCINOMA, WELL DIFFERENTIATED, SPANNING 2.5 CM. - THE SURGICAL RESECTION MARGINS ARE NEGATIVE FOR CARCINOMA. - THERE IS NO EVIDENCE OF CARCINOMA IN 1 OF 1 LYMPH NODE (   12/18/2022 Surgery   Partial simple left vulvectomy    12/18/2022 Pathology Results   A. PERINEUM, LEFT, EXCISION:  - Invasive well to moderately differentiated keratinizing squamous cell  carcinoma, 2.1 cm  - Carcinoma invades for depth of about 2 mm  - No evidence of lymphovascular invasion  - All resection margins are negative for carcinoma  - Lateral resection margins are negative for dysplasia    Vulvar cancer (HCC)  11/01/2018 Initial Diagnosis   Vulvar cancer   06/03/2022 Cancer Staging   Staging form: Vulva, AJCC 8th Edition - Pathologic stage from  06/03/2022: Stage IB (pT1b, pN0, cM0) - Signed by [name], MD on 06/03/2022 Stage prefix: Initial diagnosis   12/18/2022 Pathology Results   SURGICAL PATHOLOGY CASE: [id number] [name] Surgical Pathology Report  Clinical History: Recurrent vulvar cancer (crm)  FINAL MICROSCOPIC DIAGNOSIS:  A. 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 She underwent a total abdominal hysterectomy and bilateral salpingo-oophorectomy, pelvic and para-aortic lymphadenectomy. She received postoperative whole pelvis radiation therapy.    02/06/2015 Surgery   In December 2016 the patient was found to have a new primary squamous cell carcinoma the vulva undergoing a modified radical vulvectomy on 02/06/2015. She underwent a modified radical vulvectomy at [facility] on 02/06/2015. She was found to have a 3 cm vulvar lesion of the posterior vulva extending into the posterior vagina. A rhomboid flap was required to close the incision.  Final pathology showed some close surgical margins. However, the specimen margins were toward the time of surgery  and it was Dr Nelwyn Salisbury impression that they were widely around the primary cancer. She discussed options with the patient and they agreed to monitor her closely but not recommend any radiation therapy to the vulva.   01/13/2017 Surgery   She experienced recurrent vulvar cancer October 2018 managed by a small modified radical vulvectomy of the right side on January 13, 2017.  Depth of  invasion was 4 mm all margins were negative    03/13/2017 PET scan   1. No evidence of recurrent or metastatic disease. 2. Aortic atherosclerosis (ICD10-170.0). Coronary artery calcification.   07/20/2019 Miscellaneous   Examination of the vulva during routine surveillance on 07/20/19 showed a friable, verrucous lesion measuring approximately 2 cm was seen on the anterior wall of the suburethral vagina extending to the introitus and wrapping around to the right mid vagina. This was concerning for recurrence and was biopsied at that appointment. 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    08/11/2019 Miscellaneous   On 08/11/19 she underwent total vaginectomy. Intraoperative findings were significant for a forshortened 3cm vagina, friable 4cm plaque on the anterior vagina extending to the right fornix and left posterior vaginal wall. This necessitated a total vaginectomy to resect the lesion grossly. Her tissues were extremely friable due to age, prior radiation, menopause and lichen sclerosis. There was unavoidable fragmentation of the tissues during the resection. All gross visible disease was removed. Surgery was uncomplicated, and she reported no postop pain.  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 The suspicious foci are present at the edges of tissue fragments.  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        09/13/2019 PET scan   1. No specific evidence of recurrent vulvar carcinoma or distant metastases. There is nonspecific activity in the region of the vaginal introitus which could reflect urine contamination or vaginal recurrence. Correlation with physical examination recommended.  2. No abnormal thoracic activity status post left upper lobe wedge resection. 3.  Coronary and Aortic Atherosclerosis (ICD10-I70.0).   09/29/2019 - 10/12/2019 Radiation Therapy   The patient received adjuvant vaginal brachytherapy between 09/29/2019 through 10/12/2019.     02/28/2021 PET scan   1. Interval decrease in degree of FDG uptake associated with the vaginal introitus. The SUV max on today's study is equal to 5.96 compared with 9.17 previously. 2. No highly concerning features to suggest nodal metastasis or distant metastatic disease. Mildly increased tracer uptake within and morphologically benign left inguinal lymph node is identified which warrants attention on follow-up imaging. 3. Stable postoperative changes within the left upper lobe without signs to suggest locally recurrent tumor. 4. Aortic Atherosclerosis (ICD10-I70.0). Coronary artery calcifications.   03/11/2021 Initial Diagnosis   Recurrent vaginal cancer   03/20/2021 Procedure   03/20/21: Vaginectomy, CO2 laser of the vulva for recurrent vaginal cancer, high-grade vulvar dysplasia.  Vaginal excision showed squamous mucosa with ulceration and inflammation.  No malignancy or dysplasia identified.  Rare atypical "radiation" fibroblast embedded.    10/24/2021 Imaging   1. No acute abnormality of the abdomen or pelvis. 2. Aortic Atherosclerosis (ICD10-I70.0).   02/19/2022 Surgery   Preop Diagnosis: history of vulvar and vaginal cancer, suspicion for recurrent vulvar cancer   Postoperative Diagnosis: same as above   Surgery: Partial simple right/posterior vulvectomy with rotational rhomboid flap    Surgeons: Carin Hock  MD    Pathology: posterior vulva (6-8 o'c) with stitch at 12, additional superior, lateral and inferior margins, deep margin   Operative findings: 2 x 2.5 cm verrucous appearing lesion along the perineum and extending to the introitus, relatively broad 2 cm base. Given size of defect and its location, required rotational flap to close defect. Hyperkeratotic areas within the significantly  foreshortened vagina.   03/13/2022 PET scan    ADDENDUM REPORT: 03/14/2022 08:44   ADDENDUM: I just discussed this study with Dr. Pricilla Holm. The patient had surgery about 3 weeks ago for recurrent disease at the vaginal introitus with no residual disease apparent clinically. Additionally, the patient has had no chemotherapy in the 1 year interval since the prior study and no radiation therapy to the left groin region. In light of this additional history, the changes in the region the vaginal introitus today may well simply represent urinary contaminant rather than residual disease. Further, given lack of interval systemic chemotherapy and no interval focused therapy to the left groin lymph node, the interval stability in this node (9 mm previously versus 10 mm today) and stable low level FDG accumulation ( SUV max = 2.9 today compared to 3.0 previously) would suggest a reactive etiology as more likely, especially in light of the recurrent surgeries to the external genitalia.     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  Approximately 1 x 1 cm raised mass along the right distal vagina from 6-8 o'clock.  Second raised mass along the left vaginal sidewall extending almost up to the vaginal apex measuring approximately 1 x 1.5 cm.  Several small, less than 5 mm, masses along the vaginal apex and superior wall of the posterior vagina.  Well-healed lumbar incision from recent surgery.  Significant loss of architecture of the vulva with erythema noted of  bilateral anterior labia and along the perineum.  Multiple biopsies taken including 2 and 10:00 along the vulva and at 5:00 along the posterior vulva.     05/07/2022 Pathology Results   CASE: [id number] [name] Surgical Pathology Report  Clinical History: Recurrent vulvar cancer (crm)  FINAL MICROSCOPIC DIAGNOSIS:  A. VAGINAL LESION, DISTAL RIGHT, EXCISION:      Invasive squamous cell carcinoma.      Cauterized carcinoma present at the mucosal margin. Deep margin are negative for carcinoma.  B. VAGINAL SIDEWALL, LEFT, EXCISION:      Invasive squamous cell carcinoma.      Cauterized carcinoma present at the mucosal margin. Deep margin are negative for carcinoma.  C. VAGINA, APEX, EXCISION:      At least squamous cell carcinoma in situ.  D. VAGINA, POSTERIOR, BIOPSY:      At least squamous cell carcinoma in situ.  E. VULVA, RIGHT, BIOPSY:      Squamous mucosa with acute inflammation and reactive epithelial changes.      Negative for dysplasia or malignancy.  F. VULVA, LEFT, BIOPSY:      Squamous mucosa with acute and chronic inflammation and reactive epithelial changes.      Negative for dysplasia or malignancy.  G. VULVA, POSTERIOR, 5:00, BIOPSY:      Squamous mucosa with acute and chronic inflammation and reactive epithelial changes.      Negative for dysplasia or malignancy.     06/03/2022 Cancer Staging   Staging form: Vagina, AJCC 8th Edition - Clinical: cT1, cN0, cM0 - Signed by [name], MD on 06/03/2022   06/09/2022 - 06/09/2022 Chemotherapy   Patient is on Treatment Plan : UTERINE Pembrolizumab (200) q21d     06/09/2022 - 06/09/2022 Chemotherapy   Patient is on Treatment Plan : UTERINE Carboplatin (AUC 5) q21d     06/30/2022 -  Chemotherapy   Patient is on Treatment Plan : UTERINE Pembrolizumab (400) q42d     09/26/2022 PET scan   NM PET Image Restage (PS) Skull Base to Thigh (F-18 FDG)  Result Date: 09/25/2022 CLINICAL DATA:  Subsequent  treatment strategy for recurrent vaginal cancer. Currently on immunotherapy. Biopsy 05/07/2022. EXAM: NUCLEAR MEDICINE PET SKULL BASE TO THIGH TECHNIQUE: 7.7 mCi F-18 FDG was injected intravenously. Full-ring PET imaging was performed from the skull base to thigh after the radiotracer. CT data was obtained and used for attenuation correction and anatomic localization. Fasting blood glucose: 107 mg/dl COMPARISON:  40/98/1191 FINDINGS: Mediastinal blood pool activity: SUV max 2.2 Liver activity: SUV max NA NECK: No areas of abnormal hypermetabolism. Incidental CT findings: Cerebral atrophy. No cervical adenopathy. Bilateral carotid atherosclerosis. CHEST: No pulmonary parenchymal or thoracic nodal hypermetabolism. Incidental CT findings: Surgical changes about the left hilum and left upper lobe consistent with clinical history of prior wedge resection. Aortic and coronary artery calcification. Esophageal fluid level on 60/4. 3 mm right lower lobe pulmonary nodule on 78/4 is unchanged. ABDOMEN/PELVIS: No abdominopelvic parenchymal hypermetabolism. Abdominopelvic nodal dissection. Mildly hypermetabolic left inguinal nodes. The more cephalad measures 8 mm and a S.U.V. max of 2.6 on 168/4 versus 6 mm and a S.U.V. max of 1.8 on the prior. More caudal measures 1.0 cm and a S.U.V. max of 3.7 on 173/4 versus similar in size and a S.U.V. max of 2.9 on the prior. Incidental CT findings: Scarred upper pole right kidney. Normal adrenal glands. Abdominal aortic atherosclerosis. Hysterectomy. Pelvic floor laxity with mild cystocele. SKELETON: No abnormal marrow activity. Incidental CT findings: Right-greater-than-left hip osteoarthritis. IMPRESSION: 1. Left inguinal nodes which demonstrate mild increase in hypermetabolism. 1 has minimally enlarged. These are again favored to be reactive but warrant follow-up attention. 2. Otherwise, no evidence of recurrent or metastatic disease, status post hysterectomy and abdominopelvic node  dissection. 3. Incidental findings, including: Coronary artery atherosclerosis. Aortic Atherosclerosis (ICD10-I70.0). Esophageal air fluid level suggests dysmotility or gastroesophageal reflux. Electronically Signed   By: Jeronimo Greaves M.D.   On: 09/25/2022 14:57        Interval History: Overall doing well.  Feels recovery this time is going more slowly than it has previously.  Still using a doughnut.  Back at the gym.  Notes some constipation with hard stool, sometimes had some bleeding after bowel movements.  Denies any new vulvar symptoms.  Continues to struggle with lichen planus flare affecting her gums.  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 cath due to past injury  History of benign kidney tumor excluding renal pelvis 07/26/2009   s/p  right partial nephrectomy for angiomyolipoma   History of cervical cancer 03/1995   s/p   TAH /  BSO for poorly differentiated cervical carcinoma.   History of endometrial cancer 03/1995   s/p  TAH/ BSO for cervical cancer w/ incidental finding endometrial cancer also ,  completed radiation 11/ 1997   History of external beam radiation therapy 1997   completed abominal radation for endometrial cancer 11/ 1997   History of radiation therapy 10/12/2019   [facility] HDR brachytherapy  09/29/2019-10/12/2019  Dr Antony Blackbird  (recurrent VAIN)   History of resection of meningioma 11/1968   T12   History of small bowel obstruction 12/2008   partial SBO admission 12/2008;  recurrent SBO in 02/2011;  02/ 2017;   so far no surgical intervention (per pt has a narrowed distal colon)   Hyperlipidemia    Hypertension    Hypothyroidism    followed by pcp   Lichen sclerosus of vulva    per pt w/ chronic  itching   Nocturia    Pre-diabetes    Primary cancer of left upper lobe of lung (HCC) 08/01/2015   previously followed by oncologist--- dr Phill Myron--- released 09-13-2018  , pt non-smokerStage 1  Non-small cell ,  no chemo or radiation,  survillance   (bilateral nodules for noted in 2011)   Recurrent vaginal cancer (HCC) 08/2019   GYN oncologist--- dr Pricilla Holm---- total vaginectomy and completed radiation 09/ 2021;    laser ablation 02/ 2023   Recurrent vulvar cancer [facility]) 2016   GYN oncologist---- dr Pricilla Holm;   new dx 12/ 2016  s/p radica vulvectomy @ [facility] by dr Stanford Breed recurrent 10/ 2018 s/p modified vulvecotmy;  06/ 2021 invasive to vagina/ recurrent vulva s/p total vagainectomy & radiation; laser ablation 02/ 2023;   new lesion 12/ 2023 s/p simplec vulectomy w/ flap 01/ 2024    Past Surgical History:  Procedure Laterality Date   CATARACT EXTRACTION W/ INTRAOCULAR LENS IMPLANT Bilateral 2006   CO2 LASER APPLICATION N/A 03/20/2021   Procedure: CO2 LASER APPLICATION to vagina and vuvla;  Surgeon: Carver Fila, MD;  Location: [facility];  Service: Gynecology;  Laterality: N/A;   CO2 LASER APPLICATION N/A 05/07/2022   Procedure: POSSIBLE CO2 LASER APPLICATION TO VAGINA/VULVA;  Surgeon: Carver Fila, MD;  Location: [facility];  Service: Gynecology;  Laterality: N/A;   LYMPH NODE DISSECTION Left 08/01/2015   Procedure: LYMPH NODE DISSECTION;  Surgeon: Loreli Slot, MD;  Location: [facility] OR;  Service: Thoracic;  Laterality: Left;   RADICAL VULVECTOMY  02/06/2015   @[facility]  by dr Stanford Breed   ROBOTIC ASSITED PARTIAL NEPHRECTOMY  07/26/2009   @[facility]  by dr Laverle Patter   SEGMENTECOMY Left 08/01/2015   Procedure: Left Upper Lobe SEGMENTECTOMY;  Surgeon: Loreli Slot, MD;  Location: [facility] OR;  Service: Thoracic;  Laterality: Left;   TONSILLECTOMY AND ADENOIDECTOMY     child   TOTAL ABDOMINAL HYSTERECTOMY W/ BILATERAL SALPINGOOPHORECTOMY  03/31/1995    @[facility]  by dr Stanford Breed;   TAH/BSO pelvic and periaortic lymphadenectomy   TUBAL LIGATION Bilateral    yrs ago   TUMOR REMOVAL  11/1968   meningioma  T 12   VAGINECTOMY, PARTIAL N/A 08/11/2019   Procedure: TOTAL VAGINECTOMY;  Surgeon: Adolphus Birchwood, MD;  Location: [facility];  Service: Gynecology;  Laterality: N/A;   VAGINECTOMY, PARTIAL N/A 03/20/2021   Procedure: vaginal excision;  Surgeon: Eugene Garnet  R, MD;  Location: [facility];  Service: Gynecology;  Laterality: N/A;   VIDEO ASSISTED THORACOSCOPY (VATS)/WEDGE RESECTION Left 08/01/2015   Procedure: VIDEO ASSISTED THORACOSCOPY (VATS)/WEDGE RESECTION;  Surgeon: Loreli Slot, MD;  Location: [facility] OR;  Service: Thoracic;  Laterality: Left;   VULVA Ples Specter BIOPSY N/A 02/14/2021   Procedure: VAGINAL AND VULVAR BIOPSY;  Surgeon: Carver Fila, MD;  Location: [facility];  Service: Gynecology;  Laterality: N/A;   VULVA /PERINEUM BIOPSY N/A 05/07/2022   Procedure: POSSIBLE VAGINAL  BIOPSY;  Surgeon: Carver Fila, MD;  Location: [facility];  Service: Gynecology;  Laterality: N/A;   VULVAR LESION REMOVAL N/A 02/19/2022   Procedure: VULVAR LESION EXCISION, ROTATIONAL FLAP CLOSURE;  Surgeon: Carver Fila, MD;  Location: [facility];  Service: Gynecology;  Laterality: N/A;   VULVECTOMY N/A 05/07/2022   Procedure: POSSIBLE WIDE EXCISION VULVECTOMY/VAGINA;  Surgeon: Carver Fila, MD;  Location: [facility];  Service: Gynecology;  Laterality: N/A;   VULVECTOMY N/A 12/18/2022   Procedure: WIDE EXCISION VULVECTOMY;  Surgeon: Carver Fila, MD;  Location: [facility];  Service: Gynecology;  Laterality: N/A;   VULVECTOMY PARTIAL  01/13/2017   @[facility]  by dr Stanford Breed    Family History  Problem Relation Age of Onset   Hypertension Other    Diabetes Other    Stroke Other    CAD Other    Colon cancer Neg Hx    Breast cancer Neg Hx     Ovarian cancer Neg Hx    Endometrial cancer Neg Hx    Pancreatic cancer Neg Hx    Prostate cancer Neg Hx     Social History   Socioeconomic History   Marital status: Divorced    Spouse name: Not on file   Number of children: 2   Years of education: Not on file   Highest education level: Not on file  Occupational History   Not on file  Tobacco Use   Smoking status: Never   Smokeless tobacco: Never  Vaping Use   Vaping status: Never Used  Substance and Sexual Activity   Alcohol use: Not Currently    Comment: Occ   Drug use: Never   Sexual activity: Yes    Birth control/protection: Surgical  Other Topics Concern   Not on file  Social History Narrative   Lives at home alone, works out at [employer] 6 days a week, still very active. Has an ex-husband who she is in contact with. Son died of an MI and another son in [place].    Social Drivers of Corporate investment banker Strain: Not on file  Food Insecurity: Not on file  Transportation Needs: Not on file  Physical Activity: Not on file  Stress: Not on file  Social Connections: Not on file    Current Medications:  Current Outpatient Medications:    acetaminophen (TYLENOL) 500 MG tablet, Take 500-1,000 mg by mouth every 6 (six) hours as needed (pain.)., Disp: , Rfl:    acyclovir (ZOVIRAX) 800 MG tablet, Take 800 mg by mouth 2 (two) times daily as needed (cold sores)., Disp: , Rfl:    amLODipine (NORVASC) 5 MG tablet, Take 5 mg by mouth every evening., Disp: , Rfl:    Ascorbic Acid (VITAMIN C PO), Take 500 mg by mouth in the morning., Disp: , Rfl:    benazepril-hydrochlorthiazide (LOTENSIN HCT) 20-25 MG tablet, Take 1 tablet by mouth in the morning., Disp: ,  Rfl:    BIOTIN PO, Take 1 tablet by mouth in the morning., Disp: , Rfl:    Cholecalciferol (VITAMIN D3 PO), Take 2,000 Units by mouth in the morning and at bedtime., Disp: , Rfl:    docusate sodium (COLACE) 100 MG capsule, Take 100 mg by mouth 2 (two) times daily.,  Disp: , Rfl:    doxylamine, Sleep, (UNISOM) 25 MG tablet, Take 12.5 mg by mouth at bedtime., Disp: , Rfl:    ibuprofen (ADVIL) 400 MG tablet, Take 400 mg by mouth every 6 (six) hours as needed., Disp: , Rfl:    levothyroxine (SYNTHROID, LEVOTHROID) 75 MCG tablet, Take 75 mcg by mouth daily before breakfast., Disp: , Rfl:    MAGNESIUM PO, Take 1 tablet by mouth every evening., Disp: , Rfl:    Multiple Vitamin (MULTIVITAMIN WITH MINERALS) TABS tablet, Take 1 tablet by mouth in the morning., Disp: , Rfl:    Omega-3 Fatty Acids (FISH OIL PO), Take 1 capsule by mouth in the morning., Disp: , Rfl:    Pembrolizumab (KEYTRUDA IV), Inject into the vein. Q 6 weeks, Disp: , Rfl:    Polyethyl Glycol-Propyl Glycol (SYSTANE ULTRA) 0.4-0.3 % SOLN, Place 1-2 drops into both eyes 3 (three) times daily as needed (dry/irritated eyes.)., Disp: , Rfl:    polyethylene glycol (MIRALAX / GLYCOLAX) packet, Take 11.3333 g by mouth at bedtime. 2/3 capful, Disp: , Rfl:    simvastatin (ZOCOR) 20 MG tablet, Take 20 mg by mouth at bedtime. , Disp: , Rfl:    tacrolimus (PROTOPIC) 0.1 % ointment, Apply topically to the vulva twice daily (per Dr. Pricilla Holm) (Patient taking differently: Apply topically 2 (two) times daily as needed.), Disp: 30 g, Rfl: 1   TURMERIC PO, Take 1 capsule by mouth every evening., Disp: , Rfl:   Review of Systems: Denies appetite changes, fevers, chills, fatigue, unexplained weight changes. Denies hearing loss, neck lumps or masses, mouth sores, ringing in ears or voice changes. Denies cough or wheezing.  Denies shortness of breath. Denies chest pain or palpitations. Denies leg swelling. Denies abdominal distention, pain, blood in stools, constipation, diarrhea, nausea, vomiting, or early satiety. Denies pain with intercourse, dysuria, frequency, hematuria or incontinence. Denies hot flashes, pelvic pain, vaginal bleeding or vaginal discharge.   Denies joint pain, back pain or muscle pain/cramps. Denies  itching, rash, or wounds. Denies dizziness, headaches, numbness or seizures. Denies swollen lymph nodes or glands, denies easy bruising or bleeding. Denies anxiety, depression, confusion, or decreased concentration.  Physical Exam: BP (!) 163/73 (BP Location: Left Arm, Patient Position: Sitting) Comment: Notified RN  Pulse 73   Temp 99.2 F (37.3 C)   Resp 19   Wt 152 lb 3.2 oz (69 kg)   SpO2 99%   BMI 25.33 kg/m  General: Alert, oriented, no acute distress.  GU: External female genitalia notable for an approximately 3 x 3 cm area of raised, flesh-colored tissue along the midline perineum spanning to the left that appears most consistent with granulation tissue at the location of recent excision.  Mild erythema of the vulva, no other lesions noted.  Vulvar biopsy procedure Preoperative diagnosis: vulvar lesion Postoperative diagnosis: Same as above Physician: Pricilla Holm MD Estimated blood loss: Minimal Specimens: vulvar biopsy Procedure: After the procedure was discussed with the patient including risks and benefits, she gave verbal consent.  She was already in dorsolithotomy position.  Betadine was used to cleanse the vulva and the area of planned biopsy x3.  1 cc of 2% lidocaine  was then injected as local anesthesia.  Tischler forceps were then used to biopsy the superior margin of the raised lesion.  This was placed in formalin.  Overall the patient tolerated the procedure well.  Over nitrate was used to consolidate hemostasis.  Laboratory & Radiologic Studies: None new  Assessment & Plan: [name] Richards is a 77 y.o. 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  After surgery for resection March 2024, plan made for adjuvant chemotherapy.  Did not tolerate carboplatin, started pembrolizumab setting of a CPS score of 90%. Recurrence diagnosed in October 2024 s/p resection with negative margins on  12/18/22. Given treatment options and overall tolerance of immunotherapy, pembrolizumab restarted.  Patient overall doing well.  Raised lesion on exam today at site of recent resection that I suspect is granulation tissue.  She had no pain with injection of lidocaine or biopsy in this area.  Minimally friable but did not bleed significantly with biopsy.  I suspect that this lesion is granulation tissue but given her history, biopsy recommended and performed today.  I will call her with these results next week.  If biopsy confirms granulation tissue, we will continue with immunotherapy and frequent exams.  If recurrent disease, I will reach out to Dr. Roselind Messier again about the possibility for any additional vulvar RT dosing.  20 minutes of total time was spent for this patient encounter, including preparation, face-to-face counseling with the patient and coordination of care, and documentation of the encounter.  Eugene Garnet, MD  Division of Gynecologic Oncology  Department of Obstetrics and Gynecology  [facility]

## 2023-01-22 NOTE — Patient Instructions (Signed)
I will call you with your biopsy results early next week.

## 2023-01-24 ENCOUNTER — Other Ambulatory Visit: Payer: Self-pay

## 2023-01-26 ENCOUNTER — Telehealth: Payer: Self-pay | Admitting: *Deleted

## 2023-01-26 LAB — SURGICAL PATHOLOGY

## 2023-01-26 NOTE — Telephone Encounter (Signed)
Per Dr Pricilla Holm patient scheduled for a follow up appt with Dr Pricilla Holm on 1/24 at 3:30 pm. Patient aware

## 2023-01-27 ENCOUNTER — Encounter: Payer: Self-pay | Admitting: Oncology

## 2023-01-27 ENCOUNTER — Encounter: Payer: Self-pay | Admitting: Gynecologic Oncology

## 2023-01-27 DIAGNOSIS — C519 Malignant neoplasm of vulva, unspecified: Secondary | ICD-10-CM

## 2023-01-29 ENCOUNTER — Encounter: Payer: Self-pay | Admitting: Gynecologic Oncology

## 2023-01-29 DIAGNOSIS — L439 Lichen planus, unspecified: Secondary | ICD-10-CM | POA: Diagnosis not present

## 2023-01-29 DIAGNOSIS — I10 Essential (primary) hypertension: Secondary | ICD-10-CM | POA: Diagnosis not present

## 2023-01-29 DIAGNOSIS — R7303 Prediabetes: Secondary | ICD-10-CM | POA: Diagnosis not present

## 2023-01-29 DIAGNOSIS — E039 Hypothyroidism, unspecified: Secondary | ICD-10-CM | POA: Diagnosis not present

## 2023-01-29 DIAGNOSIS — I700 Atherosclerosis of aorta: Secondary | ICD-10-CM | POA: Diagnosis not present

## 2023-01-29 DIAGNOSIS — E785 Hyperlipidemia, unspecified: Secondary | ICD-10-CM | POA: Diagnosis not present

## 2023-02-13 ENCOUNTER — Telehealth: Payer: Self-pay | Admitting: Radiation Oncology

## 2023-02-13 NOTE — Telephone Encounter (Signed)
 Patient called to cancel consultation w. Dr. Roselind Messier due to weather on 1/6; do not want to r/s at this time. Closing referral until further notice. Advised patient to contact Dr. Pricilla Holm to send a referral once ready to schedule.

## 2023-02-16 ENCOUNTER — Ambulatory Visit: Payer: Medicare Other

## 2023-02-16 ENCOUNTER — Ambulatory Visit: Payer: Medicare Other | Admitting: Radiation Oncology

## 2023-02-18 ENCOUNTER — Other Ambulatory Visit (HOSPITAL_COMMUNITY): Payer: Self-pay

## 2023-02-18 ENCOUNTER — Encounter: Payer: Self-pay | Admitting: Hematology and Oncology

## 2023-02-18 MED ORDER — CLOBETASOL PROPIONATE 0.05 % EX OINT
TOPICAL_OINTMENT | CUTANEOUS | 0 refills | Status: DC
  Filled 2023-02-18: qty 15, 14d supply, fill #0

## 2023-02-18 MED ORDER — DEXAMETHASONE 0.5 MG/5ML PO ELIX
ORAL_SOLUTION | ORAL | 3 refills | Status: DC
  Filled 2023-02-18: qty 100, 5d supply, fill #0
  Filled 2023-02-26: qty 100, 5d supply, fill #1
  Filled 2023-03-06: qty 100, 5d supply, fill #2
  Filled 2023-03-13: qty 100, 5d supply, fill #3

## 2023-02-19 ENCOUNTER — Other Ambulatory Visit (HOSPITAL_COMMUNITY): Payer: Self-pay

## 2023-02-19 ENCOUNTER — Other Ambulatory Visit: Payer: Self-pay | Admitting: Hematology and Oncology

## 2023-02-20 ENCOUNTER — Other Ambulatory Visit (HOSPITAL_COMMUNITY): Payer: Self-pay

## 2023-02-20 ENCOUNTER — Encounter (HOSPITAL_COMMUNITY): Payer: Self-pay

## 2023-02-23 ENCOUNTER — Other Ambulatory Visit (HOSPITAL_COMMUNITY): Payer: Self-pay

## 2023-02-26 ENCOUNTER — Other Ambulatory Visit (HOSPITAL_COMMUNITY): Payer: Self-pay

## 2023-02-26 ENCOUNTER — Ambulatory Visit: Payer: Medicare Other | Admitting: Hematology and Oncology

## 2023-02-26 ENCOUNTER — Ambulatory Visit: Payer: Medicare Other

## 2023-02-26 ENCOUNTER — Other Ambulatory Visit: Payer: Medicare Other

## 2023-02-27 ENCOUNTER — Other Ambulatory Visit (HOSPITAL_COMMUNITY): Payer: Self-pay

## 2023-03-06 ENCOUNTER — Encounter: Payer: Self-pay | Admitting: Gynecologic Oncology

## 2023-03-06 ENCOUNTER — Other Ambulatory Visit (HOSPITAL_COMMUNITY): Payer: Self-pay

## 2023-03-06 ENCOUNTER — Inpatient Hospital Stay: Payer: Medicare Other | Attending: Gynecologic Oncology | Admitting: Gynecologic Oncology

## 2023-03-06 ENCOUNTER — Ambulatory Visit: Payer: Medicare Other | Admitting: Gynecologic Oncology

## 2023-03-06 VITALS — BP 142/62 | HR 74 | Temp 98.6°F | Resp 19 | Wt 151.4 lb

## 2023-03-06 DIAGNOSIS — L900 Lichen sclerosus et atrophicus: Secondary | ICD-10-CM

## 2023-03-06 DIAGNOSIS — N904 Leukoplakia of vulva: Secondary | ICD-10-CM | POA: Insufficient documentation

## 2023-03-06 DIAGNOSIS — Z9221 Personal history of antineoplastic chemotherapy: Secondary | ICD-10-CM | POA: Diagnosis not present

## 2023-03-06 DIAGNOSIS — C519 Malignant neoplasm of vulva, unspecified: Secondary | ICD-10-CM

## 2023-03-06 DIAGNOSIS — L439 Lichen planus, unspecified: Secondary | ICD-10-CM | POA: Insufficient documentation

## 2023-03-06 DIAGNOSIS — Z79899 Other long term (current) drug therapy: Secondary | ICD-10-CM | POA: Insufficient documentation

## 2023-03-06 DIAGNOSIS — C52 Malignant neoplasm of vagina: Secondary | ICD-10-CM | POA: Diagnosis present

## 2023-03-06 DIAGNOSIS — N9089 Other specified noninflammatory disorders of vulva and perineum: Secondary | ICD-10-CM

## 2023-03-06 NOTE — Patient Instructions (Signed)
It was good to see you today.    I will see you for follow-up in 3 months.  As always, if you develop any new and concerning symptoms before your next visit, please call to see me sooner.

## 2023-03-06 NOTE — Progress Notes (Signed)
Gynecologic Oncology Return Clinic Visit  03/06/23  Reason for Visit: surveillance  Treatment History: Oncology History Overview Note  Patient followed due to HX pulmonary nodules noted 2011  Primary cancer of left upper lobe of lung (HCC)   Staging form: Lung, AJCC 7th Edition   - Clinical stage from 09/27/2015: Stage IA (T1b, N0, M0) - Signed by Si Gaul, MD on 09/27/2015  PDL-1 90%    Primary cancer of left upper lobe of lung (HCC)  07/12/2015 Imaging   CT Chest IMPRESSION:  The solid component of the large part solid nodule in the left upper lobe measures 9 x 3 mm (mean diameter 6 mm.) persistent part solid nodules with solid components greater than or equal to 6 mm should be considered highly suspicious for pulmonary adenocarcinoma   08/01/2015 Initial Diagnosis   Primary cancer of left upper lobe of lung (HCC)   08/01/2015 Surgery   PROCEDURE:  Left video-assisted thoracoscopy,           Wedge resection,           Thoracoscopic lingular sparing left upper lobectomy           Lymph node dissection           On-Q local anesthetic catheter placement   08/01/2015 Pathology Results   Lung, resection (segmental or lobe), Left Upper Lobe - ADENOCARCINOMA, WELL DIFFERENTIATED, SPANNING 2.5 CM. - THE SURGICAL RESECTION MARGINS ARE NEGATIVE FOR CARCINOMA. - THERE IS NO EVIDENCE OF CARCINOMA IN 1 OF 1 LYMPH NODE (   12/18/2022 Surgery   Partial simple left vulvectomy    12/18/2022 Pathology Results   A. PERINEUM, LEFT, EXCISION:  - Invasive well to moderately differentiated keratinizing squamous cell  carcinoma, 2.1 cm  - Carcinoma invades for depth of about 2 mm  - No evidence of lymphovascular invasion  - All resection margins are negative for carcinoma  - Lateral resection margins are negative for dysplasia    Vulvar cancer (HCC)  11/01/2018 Initial Diagnosis   Vulvar cancer   06/03/2022 Cancer Staging   Staging form: Vulva, AJCC 8th Edition - Pathologic stage  from 06/03/2022: Stage IB (pT1b, pN0, cM0) - Signed by [name], MD on 06/03/2022 Stage prefix: Initial diagnosis   12/18/2022 Pathology Results   SURGICAL PATHOLOGY CASE: [phone] [name] Surgical Pathology Report  Clinical History: Recurrent vulvar cancer (crm)  FINAL MICROSCOPIC DIAGNOSIS:  A. 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 She underwent a total abdominal hysterectomy and bilateral salpingo-oophorectomy, pelvic and para-aortic lymphadenectomy. She received postoperative whole pelvis radiation therapy.    02/06/2015 Surgery   In December 2016 the patient was found to have a new primary squamous cell carcinoma the vulva undergoing a modified radical vulvectomy on 02/06/2015. She underwent a modified radical vulvectomy at [facility] on 02/06/2015. She was found to have a 3 cm vulvar lesion of the posterior vulva extending into the posterior vagina. A rhomboid flap was required to close the incision.  Final pathology showed some close surgical margins. However, the specimen margins were toward the time of surgery  and it was Dr Nelwyn Salisbury impression that they were widely around the primary cancer. She discussed options with the patient and they agreed to monitor her closely but not recommend any radiation therapy to the vulva.   01/13/2017 Surgery   She experienced recurrent vulvar cancer October 2018 managed by a small modified radical vulvectomy of the right side on January 13, 2017.  Depth  of invasion was 4 mm all margins were negative    03/13/2017 PET scan   1. No evidence of recurrent or metastatic disease. 2. Aortic atherosclerosis (ICD10-170.0). Coronary artery calcification.   07/20/2019 Miscellaneous   Examination of the vulva during routine surveillance on 07/20/19 showed a friable, verrucous lesion measuring approximately 2 cm was seen on the anterior wall of the suburethral vagina extending to the introitus and wrapping around to the right mid vagina. This was concerning for recurrence and was biopsied at that appointment. 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    08/11/2019 Miscellaneous   On 08/11/19 she underwent total vaginectomy. Intraoperative findings were significant for a forshortened 3cm vagina, friable 4cm plaque on the anterior vagina extending to the right fornix and left posterior vaginal wall. This necessitated a total vaginectomy to resect the lesion grossly. Her tissues were extremely friable due to age, prior radiation, menopause and lichen sclerosis. There was unavoidable fragmentation of the tissues during the resection. All gross visible disease was removed. Surgery was uncomplicated, and she reported no postop pain.  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 The suspicious foci are present at the edges of tissue fragments.  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        09/13/2019 PET scan   1. No specific evidence of recurrent vulvar carcinoma or distant metastases. There is nonspecific activity in the region of the vaginal introitus which could reflect urine contamination or vaginal recurrence. Correlation with physical examination recommended.  2. No abnormal thoracic activity status post left upper lobe wedge  resection. 3. Coronary and Aortic Atherosclerosis (ICD10-I70.0).   09/29/2019 - 10/12/2019 Radiation Therapy   The patient received adjuvant vaginal brachytherapy between 09/29/2019 through 10/12/2019.     02/28/2021 PET scan   1. Interval decrease in degree of FDG uptake associated with the vaginal introitus. The SUV max on today's study is equal to 5.96 compared with 9.17 previously. 2. No highly concerning features to suggest nodal metastasis or distant metastatic disease. Mildly increased tracer uptake within and morphologically benign left inguinal lymph node is identified which warrants attention on follow-up imaging. 3. Stable postoperative changes within the left upper lobe without signs to suggest locally recurrent tumor. 4. Aortic Atherosclerosis (ICD10-I70.0). Coronary artery calcifications.   03/11/2021 Initial Diagnosis   Recurrent vaginal cancer   03/20/2021 Procedure   03/20/21: Vaginectomy, CO2 laser of the vulva for recurrent vaginal cancer, high-grade vulvar dysplasia.  Vaginal excision showed squamous mucosa with ulceration and inflammation.  No malignancy or dysplasia identified.  Rare atypical "radiation" fibroblast embedded.    10/24/2021 Imaging   1. No acute abnormality of the abdomen or pelvis. 2. Aortic Atherosclerosis (ICD10-I70.0).   02/19/2022 Surgery   Preop Diagnosis: history of vulvar and vaginal cancer, suspicion for recurrent vulvar cancer   Postoperative Diagnosis: same as above   Surgery: Partial simple right/posterior vulvectomy with rotational rhomboid flap    Surgeons: Carin Hock  MD    Pathology: posterior vulva (6-8 o'c) with stitch at 12, additional superior, lateral and inferior margins, deep margin   Operative findings: 2 x 2.5 cm verrucous appearing lesion along the perineum and extending to the introitus, relatively broad 2 cm base. Given size of defect and its location, required rotational flap to close defect. Hyperkeratotic areas within the  significantly foreshortened vagina.   03/13/2022 PET scan    ADDENDUM REPORT: 03/14/2022 08:44   ADDENDUM: I just discussed this study with Dr. Pricilla Holm. The patient had surgery about 3 weeks ago for recurrent disease at the vaginal introitus with no residual disease apparent clinically. Additionally, the patient has had no chemotherapy in the 1 year interval since the prior study and no radiation therapy to the left groin region. In light of this additional history, the changes in the region the vaginal introitus today may well simply represent urinary contaminant rather than residual disease. Further, given lack of interval systemic chemotherapy and no interval focused therapy to the left groin lymph node, the interval stability in this node (9 mm previously versus 10 mm today) and stable low level FDG accumulation ( SUV max = 2.9 today compared to 3.0 previously) would suggest a reactive etiology as more likely, especially in light of the recurrent surgeries to the external genitalia.     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  Approximately 1 x 1 cm raised mass along the right distal vagina from 6-8 o'clock.  Second raised mass along the left vaginal sidewall extending almost up to the vaginal apex measuring approximately 1 x 1.5 cm.  Several small, less than 5 mm, masses along the vaginal apex and superior wall of the posterior vagina.  Well-healed lumbar incision from recent surgery.  Significant loss of architecture of the vulva with erythema  noted of bilateral anterior labia and along the perineum.  Multiple biopsies taken including 2 and 10:00 along the vulva and at 5:00 along the posterior vulva.     05/07/2022 Pathology Results   CASE: [id number] [name] Surgical Pathology Report  Clinical History: Recurrent vulvar cancer (crm)  FINAL MICROSCOPIC DIAGNOSIS:  A. VAGINAL LESION, DISTAL RIGHT, EXCISION:      Invasive squamous cell carcinoma.      Cauterized carcinoma present at the mucosal margin. Deep margin are negative for carcinoma.  B. VAGINAL SIDEWALL, LEFT, EXCISION:      Invasive squamous cell carcinoma.      Cauterized carcinoma present at the mucosal margin. Deep margin are negative for carcinoma.  C. VAGINA, APEX, EXCISION:      At least squamous cell carcinoma in situ.  D. VAGINA, POSTERIOR, BIOPSY:      At least squamous cell carcinoma in situ.  E. VULVA, RIGHT, BIOPSY:      Squamous mucosa with acute inflammation and reactive epithelial changes.      Negative for dysplasia or malignancy.  F. VULVA, LEFT, BIOPSY:      Squamous mucosa with acute and chronic inflammation and reactive epithelial changes.      Negative for dysplasia or malignancy.  G. VULVA, POSTERIOR, 5:00, BIOPSY:      Squamous mucosa with acute and chronic inflammation and reactive epithelial changes.      Negative for dysplasia or malignancy.     06/03/2022 Cancer Staging   Staging form: Vagina, AJCC 8th Edition - Clinical: cT1, cN0, cM0 - Signed by [name], MD on 06/03/2022   06/09/2022 - 06/09/2022 Chemotherapy   Patient is on Treatment Plan : UTERINE Pembrolizumab (200) q21d     06/09/2022 - 06/09/2022 Chemotherapy   Patient is on Treatment Plan : UTERINE Carboplatin (AUC 5) q21d     06/30/2022 - 01/15/2023 Chemotherapy   Patient is on Treatment Plan : UTERINE Pembrolizumab (400) q42d     09/26/2022 PET scan   NM PET Image Restage (PS) Skull Base to Thigh (F-18 FDG)  Result Date: 09/25/2022 CLINICAL  DATA:  Subsequent treatment strategy for recurrent vaginal cancer. Currently on immunotherapy. Biopsy 05/07/2022. EXAM: NUCLEAR MEDICINE PET SKULL BASE TO THIGH TECHNIQUE: 7.7 mCi F-18 FDG was injected intravenously. Full-ring PET imaging was performed from the skull base to thigh after the radiotracer. CT data was obtained and used for attenuation correction and anatomic localization. Fasting blood glucose: 107 mg/dl COMPARISON:  78/29/5621 FINDINGS: Mediastinal blood pool activity: SUV max 2.2 Liver activity: SUV max NA NECK: No areas of abnormal hypermetabolism. Incidental CT findings: Cerebral atrophy. No cervical adenopathy. Bilateral carotid atherosclerosis. CHEST: No pulmonary parenchymal or thoracic nodal hypermetabolism. Incidental CT findings: Surgical changes about the left hilum and left upper lobe consistent with clinical history of prior wedge resection. Aortic and coronary artery calcification. Esophageal fluid level on 60/4. 3 mm right lower lobe pulmonary nodule on 78/4 is unchanged. ABDOMEN/PELVIS: No abdominopelvic parenchymal hypermetabolism. Abdominopelvic nodal dissection. Mildly hypermetabolic left inguinal nodes. The more cephalad measures 8 mm and a S.U.V. max of 2.6 on 168/4 versus 6 mm and a S.U.V. max of 1.8 on the prior. More caudal measures 1.0 cm and a S.U.V. max of 3.7 on 173/4 versus similar in size and a S.U.V. max of 2.9 on the prior. Incidental CT findings: Scarred upper pole right kidney. Normal adrenal glands. Abdominal aortic atherosclerosis. Hysterectomy. Pelvic floor laxity with mild cystocele. SKELETON: No abnormal marrow activity. Incidental CT findings: Right-greater-than-left hip osteoarthritis. IMPRESSION: 1. Left inguinal nodes which demonstrate mild increase in hypermetabolism. 1 has minimally enlarged. These are again favored to be reactive but warrant follow-up attention. 2. Otherwise, no evidence of recurrent or metastatic disease, status post hysterectomy and  abdominopelvic node dissection. 3. Incidental findings, including: Coronary artery atherosclerosis. Aortic Atherosclerosis (ICD10-I70.0). Esophageal air fluid level suggests dysmotility or gastroesophageal reflux. Electronically Signed   By: Jeronimo Greaves M.D.   On: 09/25/2022 14:57        Interval History: Doing well.  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  Still having some symptoms related to lichen sclerosus (itching) of her vulva but overall improved.  Tried to use tacrolimus twice and this caused significant worsening of pruritus and irritation.  Has used clobetasol as needed for symptoms with some relief.  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 cath due to  past injury   History of benign kidney tumor excluding renal pelvis 07/26/2009   s/p  right partial nephrectomy for angiomyolipoma   History of cervical cancer 03/1995   s/p   TAH /  BSO for poorly differentiated cervical carcinoma.   History of endometrial cancer 03/1995   s/p  TAH/ BSO for cervical cancer w/ incidental finding endometrial cancer also ,  completed radiation 11/ 1997   History of external beam radiation therapy 1997   completed abominal radation for endometrial cancer 11/ 1997   History of radiation therapy 10/12/2019   [facility] HDR brachytherapy  09/29/2019-10/12/2019  Dr Antony Blackbird  (recurrent VAIN)   History of resection of meningioma 11/1968   T12   History of small bowel obstruction 12/2008   partial SBO admission 12/2008;  recurrent SBO in 02/2011;  02/ 2017;   so far no surgical intervention (per pt has a narrowed distal colon)   Hyperlipidemia    Hypertension    Hypothyroidism    followed by pcp    Lichen sclerosus of vulva    per pt w/ chronic itching   Nocturia    Pre-diabetes    Primary cancer of left upper lobe of lung (HCC) 08/01/2015   previously followed by oncologist--- dr Phill Myron--- released 09-13-2018  , pt non-smokerStage 1  Non-small cell ,  no chemo or radiation,  survillance   (bilateral nodules for noted in 2011)   Recurrent vaginal cancer (HCC) 08/2019   GYN oncologist--- dr Pricilla Holm---- total vaginectomy and completed radiation 09/ 2021;    laser ablation 02/ 2023   Recurrent vulvar cancer [facility]) 2016   GYN oncologist---- dr Pricilla Holm;   new dx 12/ 2016  s/p radica vulvectomy @ [facility] by dr Stanford Breed recurrent 10/ 2018 s/p modified vulvecotmy;  06/ 2021 invasive to vagina/ recurrent vulva s/p total vagainectomy & radiation; laser ablation 02/ 2023;   new lesion 12/ 2023 s/p simplec vulectomy w/ flap 01/ 2024    Past Surgical History:  Procedure Laterality Date   CATARACT EXTRACTION W/ INTRAOCULAR LENS IMPLANT Bilateral 2006   CO2 LASER APPLICATION N/A 03/20/2021   Procedure: CO2 LASER APPLICATION to vagina and vuvla;  Surgeon: Carver Fila, MD;  Location: [facility];  Service: Gynecology;  Laterality: N/A;   CO2 LASER APPLICATION N/A 05/07/2022   Procedure: POSSIBLE CO2 LASER APPLICATION TO VAGINA/VULVA;  Surgeon: Carver Fila, MD;  Location: [facility];  Service: Gynecology;  Laterality: N/A;   LYMPH NODE DISSECTION Left 08/01/2015   Procedure: LYMPH NODE DISSECTION;  Surgeon: Loreli Slot, MD;  Location: [facility] OR;  Service: Thoracic;  Laterality: Left;   RADICAL VULVECTOMY  02/06/2015   @[facility]  by dr Stanford Breed   ROBOTIC ASSITED PARTIAL NEPHRECTOMY  07/26/2009   @[facility]  by dr Laverle Patter   SEGMENTECOMY Left 08/01/2015   Procedure: Left Upper Lobe SEGMENTECTOMY;  Surgeon: Loreli Slot, MD;  Location: [facility] OR;  Service: Thoracic;  Laterality: Left;   TONSILLECTOMY AND ADENOIDECTOMY     child   TOTAL ABDOMINAL  HYSTERECTOMY W/ BILATERAL SALPINGOOPHORECTOMY  03/31/1995   @[facility]  by dr Stanford Breed;   TAH/BSO pelvic and periaortic lymphadenectomy   TUBAL LIGATION Bilateral    yrs ago   TUMOR REMOVAL  11/1968   meningioma  T 12   VAGINECTOMY, PARTIAL N/A 08/11/2019   Procedure: TOTAL VAGINECTOMY;  Surgeon: Adolphus Birchwood, MD;  Location: [facility];  Service: Gynecology;  Laterality: N/A;   VAGINECTOMY, PARTIAL N/A 03/20/2021   Procedure: vaginal excision;  Surgeon: Carver Fila, MD;  Location: [facility];  Service: Gynecology;  Laterality: N/A;   VIDEO ASSISTED THORACOSCOPY (VATS)/WEDGE RESECTION Left 08/01/2015   Procedure: VIDEO ASSISTED THORACOSCOPY (VATS)/WEDGE RESECTION;  Surgeon: Loreli Slot, MD;  Location: [facility] OR;  Service: Thoracic;  Laterality: Left;   VULVA Ples Specter BIOPSY N/A 02/14/2021   Procedure: VAGINAL AND VULVAR BIOPSY;  Surgeon: Carver Fila, MD;  Location: [facility];  Service: Gynecology;  Laterality: N/A;   VULVA /PERINEUM BIOPSY N/A 05/07/2022   Procedure: POSSIBLE VAGINAL  BIOPSY;  Surgeon: Carver Fila, MD;  Location: [facility];  Service: Gynecology;  Laterality: N/A;   VULVAR LESION REMOVAL N/A 02/19/2022   Procedure: VULVAR LESION EXCISION, ROTATIONAL FLAP CLOSURE;  Surgeon: Carver Fila, MD;  Location: [facility];  Service: Gynecology;  Laterality: N/A;   VULVECTOMY N/A 05/07/2022   Procedure: POSSIBLE WIDE EXCISION VULVECTOMY/VAGINA;  Surgeon: Carver Fila, MD;  Location: [facility];  Service: Gynecology;  Laterality: N/A;   VULVECTOMY N/A 12/18/2022   Procedure: WIDE EXCISION VULVECTOMY;  Surgeon: Carver Fila, MD;  Location: [facility];  Service: Gynecology;  Laterality: N/A;   VULVECTOMY PARTIAL  01/13/2017   @[facility]  by dr Stanford Breed    Family History  Problem Relation Age of Onset   Hypertension Other    Diabetes Other    Stroke Other    CAD Other     Colon cancer Neg Hx    Breast cancer Neg Hx    Ovarian cancer Neg Hx    Endometrial cancer Neg Hx    Pancreatic cancer Neg Hx    Prostate cancer Neg Hx     Social History   Socioeconomic History   Marital status: Divorced    Spouse name: Not on file   Number of children: 2   Years of education: Not on file   Highest education level: Not on file  Occupational History   Not on file  Tobacco Use   Smoking status: Never   Smokeless tobacco: Never  Vaping Use   Vaping status: Never Used  Substance and Sexual Activity   Alcohol use: Not Currently    Comment: Occ   Drug use: Never   Sexual activity: Yes    Birth control/protection: Surgical  Other Topics Concern   Not on file  Social History Narrative   Lives at home alone, works out at [employer] 6 days a week, still very active. Has an ex-husband who she is in contact with. Son died of an MI and another son in [place].    Social Drivers of Corporate investment banker Strain: Not on file  Food Insecurity: Not on file  Transportation Needs: Not on file  Physical Activity: Not on file  Stress: Not on file  Social Connections: Not on file    Current Medications:  Current Outpatient Medications:    acetaminophen (TYLENOL) 500 MG tablet, Take 500-1,000 mg by mouth every 6 (six) hours as needed (pain.)., Disp: , Rfl:    acyclovir (ZOVIRAX) 800 MG tablet, Take 800 mg by mouth 2 (two) times daily as needed (cold sores)., Disp: , Rfl:    amLODipine (NORVASC) 5 MG tablet, Take 5 mg by mouth every evening., Disp: , Rfl:    Ascorbic Acid (VITAMIN C PO), Take 500 mg by mouth in the morning., Disp: , Rfl:    benazepril-hydrochlorthiazide (LOTENSIN HCT) 20-25 MG tablet, Take 1 tablet by mouth in  the morning., Disp: , Rfl:    BIOTIN PO, Take 1 tablet by mouth in the morning., Disp: , Rfl:    Cholecalciferol (VITAMIN D3 PO), Take 2,000 Units by mouth in the morning and at bedtime., Disp: , Rfl:    dexamethasone 0.5 MG/5ML elixir,  Swish with 5mL (1tsp) for 2 minutes four times per day then spit out, Disp: 100 mL, Rfl: 3   docusate sodium (COLACE) 100 MG capsule, Take 100 mg by mouth 2 (two) times daily., Disp: , Rfl:    doxylamine, Sleep, (UNISOM) 25 MG tablet, Take 12.5 mg by mouth at bedtime., Disp: , Rfl:    ibuprofen (ADVIL) 400 MG tablet, Take 400 mg by mouth every 6 (six) hours as needed., Disp: , Rfl:    levothyroxine (SYNTHROID, LEVOTHROID) 75 MCG tablet, Take 75 mcg by mouth daily before breakfast., Disp: , Rfl:    MAGNESIUM PO, Take 1 tablet by mouth every evening., Disp: , Rfl:    Multiple Vitamin (MULTIVITAMIN WITH MINERALS) TABS tablet, Take 1 tablet by mouth in the morning., Disp: , Rfl:    Omega-3 Fatty Acids (FISH OIL PO), Take 1 capsule by mouth in the morning., Disp: , Rfl:    Pembrolizumab (KEYTRUDA IV), Inject into the vein. Q 6 weeks, Disp: , Rfl:    Polyethyl Glycol-Propyl Glycol (SYSTANE ULTRA) 0.4-0.3 % SOLN, Place 1-2 drops into both eyes 3 (three) times daily as needed (dry/irritated eyes.)., Disp: , Rfl:    polyethylene glycol (MIRALAX / GLYCOLAX) packet, Take 11.3333 g by mouth at bedtime. 2/3 capful, Disp: , Rfl:    simvastatin (ZOCOR) 20 MG tablet, Take 20 mg by mouth at bedtime. , Disp: , Rfl:    tacrolimus (PROTOPIC) 0.1 % ointment, Apply topically to the vulva twice daily (per Dr. Pricilla Holm) (Patient taking differently: Apply topically 2 (two) times daily as needed.), Disp: 30 g, Rfl: 1   TURMERIC PO, Take 1 capsule by mouth every evening., Disp: , Rfl:   Review of Systems: Denies appetite changes, fevers, chills, fatigue, unexplained weight changes. Denies hearing loss, neck lumps or masses, mouth sores, ringing in ears or voice changes. Denies cough or wheezing.  Denies shortness of breath. Denies chest pain or palpitations. Denies leg swelling. Denies abdominal distention, pain, blood in stools, constipation, diarrhea, nausea, vomiting, or early satiety. Denies pain with intercourse,  dysuria, frequency, hematuria or incontinence. Denies hot flashes, pelvic pain, vaginal bleeding or vaginal discharge.   Denies joint pain, back pain or muscle pain/cramps. Denies rash, or wounds. Denies dizziness, headaches, numbness or seizures. Denies swollen lymph nodes or glands, denies easy bruising or bleeding. Denies anxiety, depression, confusion, or decreased concentration.  Physical Exam: BP (!) 142/62 Comment: manual recheck  Pulse 74   Temp 98.6 F (37 C) (Oral)   Resp 19   Wt 151 lb 6.4 oz (68.7 kg)   SpO2 100%   BMI 25.19 kg/m  General: Alert, oriented, no acute distress. HEENT: Normocephalic, atraumatic, sclera anicteric. Chest: Unlabored breathing on room air.   GU: No amount of the genitalia noted.  Some erythema of the anterior vulva without any lesions or atypical vascularity.  No ulcerations.  There is a 2 x 1 cm fleshy, slightly raised lesion along the posterior perineum from 5-6 o'clock, decreased in size from her last exam when biopsy was taken.    Laboratory & Radiologic Studies: None new  Assessment & Plan: EILAH COMMON is a 78 y.o. woman with a history of endometrial cancer as well  as a vulvar cancer, treated for multiple vulvar and vaginal recurrences.  After surgery for resection March 2024, plan made for adjuvant chemotherapy.  Did not tolerate carboplatin, started pembrolizumab setting of a CPS score of 90%. Recurrence diagnosed in October 2024 s/p resection with negative margins on 12/18/22. Given treatment options and overall tolerance of immunotherapy, then had been to restart pembrolizumab.  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  She is overall doing well.  Raised lesion previously seen on her exam last time and biopsy showed granulation tissue is smaller today in size and less raised.  Will continue to follow this lesion.  Picture taken today for  documentation.  No additional lesions seen on her vulva.  We had previously discussed option of vulvar radiation.  She ultimately decided to cancel her appointment with Dr. Roselind Messier.  Open to radiation in the future but would like to defer this at this time.  We will plan on follow-up visit in 3 months.  Continue clobetasol as needed for lichen sclerosus symptoms.  22 minutes of total time was spent for this patient encounter, including preparation, face-to-face counseling with the patient and coordination of care, and documentation of the encounter.  Eugene Garnet, MD  Division of Gynecologic Oncology  Department of Obstetrics and Gynecology  [facility]

## 2023-03-09 ENCOUNTER — Other Ambulatory Visit (HOSPITAL_COMMUNITY): Payer: Self-pay

## 2023-03-13 ENCOUNTER — Other Ambulatory Visit (HOSPITAL_COMMUNITY): Payer: Self-pay

## 2023-04-23 DIAGNOSIS — H40003 Preglaucoma, unspecified, bilateral: Secondary | ICD-10-CM | POA: Diagnosis not present

## 2023-05-08 ENCOUNTER — Ambulatory Visit: Payer: Medicare Other | Admitting: Gynecologic Oncology

## 2023-05-22 DIAGNOSIS — Z1231 Encounter for screening mammogram for malignant neoplasm of breast: Secondary | ICD-10-CM | POA: Diagnosis not present

## 2023-06-02 ENCOUNTER — Other Ambulatory Visit (HOSPITAL_COMMUNITY): Payer: Self-pay

## 2023-06-02 ENCOUNTER — Encounter: Payer: Self-pay | Admitting: Gynecologic Oncology

## 2023-06-02 ENCOUNTER — Other Ambulatory Visit: Payer: Self-pay

## 2023-06-02 MED ORDER — DEXAMETHASONE 0.5 MG/5ML PO SOLN
ORAL | 0 refills | Status: DC
  Filled 2023-06-02: qty 500, 25d supply, fill #0
  Filled 2023-06-02: qty 100, 5d supply, fill #0

## 2023-06-03 ENCOUNTER — Other Ambulatory Visit (HOSPITAL_COMMUNITY): Payer: Self-pay

## 2023-06-04 ENCOUNTER — Encounter: Payer: Self-pay | Admitting: Gynecologic Oncology

## 2023-06-04 ENCOUNTER — Inpatient Hospital Stay: Payer: Medicare Other | Attending: Gynecologic Oncology | Admitting: Gynecologic Oncology

## 2023-06-04 VITALS — BP 144/72 | HR 63 | Temp 98.0°F | Resp 16 | Ht 65.0 in | Wt 152.0 lb

## 2023-06-04 DIAGNOSIS — C519 Malignant neoplasm of vulva, unspecified: Secondary | ICD-10-CM

## 2023-06-04 DIAGNOSIS — Z9079 Acquired absence of other genital organ(s): Secondary | ICD-10-CM | POA: Insufficient documentation

## 2023-06-04 DIAGNOSIS — Z8541 Personal history of malignant neoplasm of cervix uteri: Secondary | ICD-10-CM | POA: Diagnosis not present

## 2023-06-04 DIAGNOSIS — Z923 Personal history of irradiation: Secondary | ICD-10-CM | POA: Diagnosis not present

## 2023-06-04 DIAGNOSIS — Z9221 Personal history of antineoplastic chemotherapy: Secondary | ICD-10-CM | POA: Diagnosis not present

## 2023-06-04 DIAGNOSIS — Z8544 Personal history of malignant neoplasm of other female genital organs: Secondary | ICD-10-CM | POA: Insufficient documentation

## 2023-06-04 DIAGNOSIS — N904 Leukoplakia of vulva: Secondary | ICD-10-CM | POA: Diagnosis not present

## 2023-06-04 DIAGNOSIS — Z90710 Acquired absence of both cervix and uterus: Secondary | ICD-10-CM | POA: Diagnosis not present

## 2023-06-04 DIAGNOSIS — L900 Lichen sclerosus et atrophicus: Secondary | ICD-10-CM

## 2023-06-04 DIAGNOSIS — Z90722 Acquired absence of ovaries, bilateral: Secondary | ICD-10-CM | POA: Diagnosis not present

## 2023-06-04 NOTE — Patient Instructions (Signed)
 It was good to see you today.    I will see you for follow-up in 3 months.  As always, if you develop any new and concerning symptoms before your next visit, please call to see me sooner.

## 2023-06-04 NOTE — Progress Notes (Signed)
 Gynecologic Oncology Return Clinic Visit  06/04/23  Reason for Visit: surveillance   Treatment History: Oncology History Overview Note  Patient followed due to HX pulmonary nodules noted 2011  Primary cancer of left upper lobe of lung (HCC)   Staging form: Lung, AJCC 7th Edition   - Clinical stage from 09/27/2015: Stage IA (T1b, N0, M0) - Signed by Marlene Simas, MD on 09/27/2015  PDL-1 90%    Primary cancer of left upper lobe of lung (HCC)  07/12/2015 Imaging   CT Chest IMPRESSION:  The solid component of the large part solid nodule in the left upper lobe measures 9 x 3 mm (mean diameter 6 mm.) persistent part solid nodules with solid components greater than or equal to 6 mm should be considered highly suspicious for pulmonary adenocarcinoma   08/01/2015 Initial Diagnosis   Primary cancer of left upper lobe of lung (HCC)   08/01/2015 Surgery   PROCEDURE:  Left video-assisted thoracoscopy,           Wedge resection,           Thoracoscopic lingular sparing left upper lobectomy           Lymph node dissection           On-Q local anesthetic catheter placement   08/01/2015 Pathology Results   Lung, resection (segmental or lobe), Left Upper Lobe - ADENOCARCINOMA, WELL DIFFERENTIATED, SPANNING 2.5 CM. - THE SURGICAL RESECTION MARGINS ARE NEGATIVE FOR CARCINOMA. - THERE IS NO EVIDENCE OF CARCINOMA IN 1 OF 1 LYMPH NODE (   12/18/2022 Surgery   Partial simple left vulvectomy    12/18/2022 Pathology Results   A. PERINEUM, LEFT, EXCISION:  - Invasive well to moderately differentiated keratinizing squamous cell  carcinoma, 2.1 cm  - Carcinoma invades for depth of about 2 mm  - No evidence of lymphovascular invasion  - All resection margins are negative for carcinoma  - Lateral resection margins are negative for dysplasia    Vulvar cancer (HCC)  11/01/2018 Initial Diagnosis   Vulvar cancer   06/03/2022 Cancer Staging   Staging form: Vulva, AJCC 8th Edition - Pathologic stage  from 06/03/2022: Stage IB (pT1b, pN0, cM0) - Signed by Almeda Jacobs, MD on 06/03/2022 Stage prefix: Initial diagnosis   12/18/2022 Pathology Results   SURGICAL PATHOLOGY CASE: [id number] [id number] [name] Surgical Pathology Report  Clinical History: Recurrent vulvar cancer (crm)  FINAL MICROSCOPIC DIAGNOSIS:  A. 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 She underwent a total abdominal hysterectomy and bilateral salpingo-oophorectomy, pelvic and para-aortic lymphadenectomy. She received postoperative whole pelvis radiation therapy.    02/06/2015 Surgery   In December 2016 the patient was found to have a new primary squamous cell carcinoma the vulva undergoing a modified radical vulvectomy on 02/06/2015. She underwent a modified radical vulvectomy at [facility] on 02/06/2015. She was found to have a 3 cm vulvar lesion of the posterior vulva extending into the posterior vagina. A rhomboid flap was required to close the incision.  Final pathology showed some close surgical margins. However, the specimen margins were toward the time of  surgery and it was Dr Jacqulin Maus impression that they were widely around the primary cancer. She discussed options with the patient and they agreed to monitor her closely but not recommend any radiation therapy to the vulva.   01/13/2017 Surgery   She experienced recurrent vulvar cancer October 2018 managed by a small modified radical vulvectomy of the right side on January 13, 2017.  Depth  of invasion was 4 mm all margins were negative    03/13/2017 PET scan   1. No evidence of recurrent or metastatic disease. 2. Aortic atherosclerosis (ICD10-170.0). Coronary artery calcification.   07/20/2019 Miscellaneous   Examination of the vulva during routine surveillance on 07/20/19 showed a friable, verrucous lesion measuring approximately 2 cm was seen on the anterior wall of the suburethral vagina extending to the introitus and wrapping around to the right mid vagina. This was concerning for recurrence and was biopsied at that appointment. 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    08/11/2019 Miscellaneous   On 08/11/19 she underwent total vaginectomy. Intraoperative findings were significant for a forshortened 3cm vagina, friable 4cm plaque on the anterior vagina extending to the right fornix and left posterior vaginal wall. This necessitated a total vaginectomy to resect the lesion grossly. Her tissues were extremely friable due to age, prior radiation, menopause and lichen sclerosis. There was unavoidable fragmentation of the tissues during the resection. All gross visible disease was removed. Surgery was uncomplicated, and she reported no postop pain.  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 The suspicious foci are present at the edges of tissue fragments.  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        09/13/2019 PET scan   1. No specific evidence of recurrent vulvar carcinoma or distant metastases. There is nonspecific activity in the region of the vaginal introitus which could reflect urine contamination or vaginal recurrence. Correlation with physical examination recommended.  2. No abnormal thoracic activity status post left upper lobe wedge  resection. 3. Coronary and Aortic Atherosclerosis (ICD10-I70.0).   09/29/2019 - 10/12/2019 Radiation Therapy   The patient received adjuvant vaginal brachytherapy between 09/29/2019 through 10/12/2019.     02/28/2021 PET scan   1. Interval decrease in degree of FDG uptake associated with the vaginal introitus. The SUV max on today's study is equal to 5.96 compared with 9.17 previously. 2. No highly concerning features to suggest nodal metastasis or distant metastatic disease. Mildly increased tracer uptake within and morphologically benign left inguinal lymph node is identified which warrants attention on follow-up imaging. 3. Stable postoperative changes within the left upper lobe without signs to suggest locally recurrent tumor. 4. Aortic Atherosclerosis (ICD10-I70.0). Coronary artery calcifications.   03/11/2021 Initial Diagnosis   Recurrent vaginal cancer   03/20/2021 Procedure   03/20/21: Vaginectomy, CO2 laser of the vulva for recurrent vaginal cancer, high-grade vulvar dysplasia.  Vaginal excision showed squamous mucosa with ulceration and inflammation.  No malignancy or dysplasia identified.  Rare atypical "radiation" fibroblast embedded.    10/24/2021 Imaging   1. No acute abnormality of the abdomen or pelvis. 2. Aortic Atherosclerosis (ICD10-I70.0).   02/19/2022 Surgery   Preop Diagnosis: history of vulvar and vaginal cancer, suspicion for recurrent vulvar cancer   Postoperative Diagnosis: same as above   Surgery: Partial simple right/posterior vulvectomy with rotational rhomboid flap    Surgeons: Orvil Bland,  Barrett Lick MD    Pathology: posterior vulva (6-8 o'c) with stitch at 12, additional superior, lateral and inferior margins, deep margin   Operative findings: 2 x 2.5 cm verrucous appearing lesion along the perineum and extending to the introitus, relatively broad 2 cm base. Given size of defect and its location, required rotational flap to close defect. Hyperkeratotic areas within the  significantly foreshortened vagina.   03/13/2022 PET scan    ADDENDUM REPORT: 03/14/2022 08:44   ADDENDUM: I just discussed this study with Dr. Orvil Bland. The patient had surgery about 3 weeks ago for recurrent disease at the vaginal introitus with no residual disease apparent clinically. Additionally, the patient has had no chemotherapy in the 1 year interval since the prior study and no radiation therapy to the left groin region. In light of this additional history, the changes in the region the vaginal introitus today may well simply represent urinary contaminant rather than residual disease. Further, given lack of interval systemic chemotherapy and no interval focused therapy to the left groin lymph node, the interval stability in this node (9 mm previously versus 10 mm today) and stable low level FDG accumulation ( SUV max = 2.9 today compared to 3.0 previously) would suggest a reactive etiology as more likely, especially in light of the recurrent surgeries to the external genitalia.     05/07/2022 Surgery   Preop Diagnosis: Recurrent vaginal CIS, concern for invasion   Postoperative Diagnosis: same as above   Surgery: Partial vaginectomy, vulvar biopsies   Surgeons:  Brinton Canavan, MD    Pathology: distal right vaginal lesion, left vaginal sidewall lesion, apical vaginal lesion, posterior vaginal lesion, right vulvar biopsy 2 o'c, left vulvar biopsy 10 o'c, posterior vulvar biopsy 5 o'c    Operative findings: Multiple frondular masses within the shortened vagina.  Approximately 1 x 1 cm raised mass along the right distal vagina from 6-8 o'clock.  Second raised mass along the left vaginal sidewall extending almost up to the vaginal apex measuring approximately 1 x 1.5 cm.  Several small, less than 5 mm, masses along the vaginal apex and superior wall of the posterior vagina.  Well-healed lumbar incision from recent surgery.  Significant loss of architecture of the vulva with erythema  noted of bilateral anterior labia and along the perineum.  Multiple biopsies taken including 2 and 10:00 along the vulva and at 5:00 along the posterior vulva.     05/07/2022 Pathology Results   CASE: [id number] [name] Surgical Pathology Report  Clinical History: Recurrent vulvar cancer (crm)  FINAL MICROSCOPIC DIAGNOSIS:  A. VAGINAL LESION, DISTAL RIGHT, EXCISION:      Invasive squamous cell carcinoma.      Cauterized carcinoma present at the mucosal margin. Deep margin are negative for carcinoma.  B. VAGINAL SIDEWALL, LEFT, EXCISION:      Invasive squamous cell carcinoma.      Cauterized carcinoma present at the mucosal margin. Deep margin are negative for carcinoma.  C. VAGINA, APEX, EXCISION:      At least squamous cell carcinoma in situ.  D. VAGINA, POSTERIOR, BIOPSY:      At least squamous cell carcinoma in situ.  E. VULVA, RIGHT, BIOPSY:      Squamous mucosa with acute inflammation and reactive epithelial changes.      Negative for dysplasia or malignancy.  F. VULVA, LEFT, BIOPSY:      Squamous mucosa with acute and chronic inflammation and reactive epithelial changes.      Negative for dysplasia or malignancy.  G. VULVA, POSTERIOR, 5:00, BIOPSY:      Squamous mucosa with acute and chronic inflammation and reactive epithelial changes.      Negative for dysplasia or malignancy.     06/03/2022 Cancer Staging   Staging form: Vagina, AJCC 8th Edition - Clinical: cT1, cN0, cM0 - Signed by Almeda Jacobs, MD on 06/03/2022   06/09/2022 - 06/09/2022 Chemotherapy   Patient is on Treatment Plan : UTERINE Pembrolizumab  (200) q21d     06/09/2022 - 06/09/2022 Chemotherapy   Patient is on Treatment Plan : UTERINE Carboplatin  (AUC 5) q21d     06/30/2022 - 01/15/2023 Chemotherapy   Patient is on Treatment Plan : UTERINE Pembrolizumab  (400) q42d     09/26/2022 PET scan   NM PET Image Restage (PS) Skull Base to Thigh (F-18 FDG)  Result Date: 09/25/2022 CLINICAL  DATA:  Subsequent treatment strategy for recurrent vaginal cancer. Currently on immunotherapy. Biopsy 05/07/2022. EXAM: NUCLEAR MEDICINE PET SKULL BASE TO THIGH TECHNIQUE: 7.7 mCi F-18 FDG was injected intravenously. Full-ring PET imaging was performed from the skull base to thigh after the radiotracer. CT data was obtained and used for attenuation correction and anatomic localization. Fasting blood glucose: 107 mg/dl COMPARISON:  16/11/9602 FINDINGS: Mediastinal blood pool activity: SUV max 2.2 Liver activity: SUV max NA NECK: No areas of abnormal hypermetabolism. Incidental CT findings: Cerebral atrophy. No cervical adenopathy. Bilateral carotid atherosclerosis. CHEST: No pulmonary parenchymal or thoracic nodal hypermetabolism. Incidental CT findings: Surgical changes about the left hilum and left upper lobe consistent with clinical history of prior wedge resection. Aortic and coronary artery calcification. Esophageal fluid level on 60/4. 3 mm right lower lobe pulmonary nodule on 78/4 is unchanged. ABDOMEN/PELVIS: No abdominopelvic parenchymal hypermetabolism. Abdominopelvic nodal dissection. Mildly hypermetabolic left inguinal nodes. The more cephalad measures 8 mm and a S.U.V. max of 2.6 on 168/4 versus 6 mm and a S.U.V. max of 1.8 on the prior. More caudal measures 1.0 cm and a S.U.V. max of 3.7 on 173/4 versus similar in size and a S.U.V. max of 2.9 on the prior. Incidental CT findings: Scarred upper pole right kidney. Normal adrenal glands. Abdominal aortic atherosclerosis. Hysterectomy. Pelvic floor laxity with mild cystocele. SKELETON: No abnormal marrow activity. Incidental CT findings: Right-greater-than-left hip osteoarthritis. IMPRESSION: 1. Left inguinal nodes which demonstrate mild increase in hypermetabolism. 1 has minimally enlarged. These are again favored to be reactive but warrant follow-up attention. 2. Otherwise, no evidence of recurrent or metastatic disease, status post hysterectomy and  abdominopelvic node dissection. 3. Incidental findings, including: Coronary artery atherosclerosis. Aortic Atherosclerosis (ICD10-I70.0). Esophageal air fluid level suggests dysmotility or gastroesophageal reflux. Electronically Signed   By: Lore Rode M.D.   On: 09/25/2022 14:57        Interval History: Doing well.  Looking forward to transatlantic trip coming up where she will travel by ship from [place]  to [place]. Denies any vaginal bleeding or discharge.  Reports intermittent erythema and irritation of her vulva.  Has found it most useful to use a Perry bottle after urinating and thin layer of Vaseline.  Having a lot of oral symptoms related to lichen planus.  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  cath due to past injury   History of benign kidney tumor excluding renal pelvis 07/26/2009   s/p  right partial nephrectomy for angiomyolipoma   History of cervical cancer 03/1995   s/p   TAH /  BSO for poorly differentiated cervical carcinoma.   History of endometrial cancer 03/1995   s/p  TAH/ BSO for cervical cancer w/ incidental finding endometrial cancer also ,  completed radiation 11/ 1997   History of external beam radiation therapy 1997   completed abominal radation for endometrial cancer 11/ 1997   History of radiation therapy 10/12/2019   [facility] HDR brachytherapy  09/29/2019-10/12/2019  Dr Retta Caster  (recurrent VAIN)   History of resection of meningioma 11/1968   T12   History of small bowel obstruction 12/2008   partial SBO admission 12/2008;  recurrent SBO in 02/2011;  02/ 2017;   so far no surgical intervention (per pt has a narrowed distal colon)   Hyperlipidemia    Hypertension    Hypothyroidism     followed by pcp   Lichen sclerosus of vulva    per pt w/ chronic itching   Nocturia    Pre-diabetes    Primary cancer of left upper lobe of lung (HCC) 08/01/2015   previously followed by oncologist--- dr Rox Cope--- released 09-13-2018  , pt non-smokerStage 1  Non-small cell ,  no chemo or radiation,  survillance   (bilateral nodules for noted in 2011)   Recurrent vaginal cancer (HCC) 08/2019   GYN oncologist--- dr Orvil Bland---- total vaginectomy and completed radiation 09/ 2021;    laser ablation 02/ 2023   Recurrent vulvar cancer [facility]) 2016   GYN oncologist---- dr Orvil Bland;   new dx 12/ 2016  s/p radica vulvectomy @ [facility] by dr Willis Harter recurrent 10/ 2018 s/p modified vulvecotmy;  06/ 2021 invasive to vagina/ recurrent vulva s/p total vagainectomy & radiation; laser ablation 02/ 2023;   new lesion 12/ 2023 s/p simplec vulectomy w/ flap 01/ 2024    Past Surgical History:  Procedure Laterality Date   CATARACT EXTRACTION W/ INTRAOCULAR LENS IMPLANT Bilateral 2006   CO2 LASER APPLICATION N/A 03/20/2021   Procedure: CO2 LASER APPLICATION to vagina and vuvla;  Surgeon: Suzi Essex, MD;  Location: [facility];  Service: Gynecology;  Laterality: N/A;   CO2 LASER APPLICATION N/A 05/07/2022   Procedure: POSSIBLE CO2 LASER APPLICATION TO VAGINA/VULVA;  Surgeon: Suzi Essex, MD;  Location: [facility];  Service: Gynecology;  Laterality: N/A;   LYMPH NODE DISSECTION Left 08/01/2015   Procedure: LYMPH NODE DISSECTION;  Surgeon: Zelphia Higashi, MD;  Location: [facility] OR;  Service: Thoracic;  Laterality: Left;   RADICAL VULVECTOMY  02/06/2015   @[facility]  by dr Willis Harter   ROBOTIC ASSITED PARTIAL NEPHRECTOMY  07/26/2009   @[facility]  by dr Rozanne Corners   SEGMENTECOMY Left 08/01/2015   Procedure: Left Upper Lobe SEGMENTECTOMY;  Surgeon: Zelphia Higashi, MD;  Location: [facility] OR;  Service: Thoracic;  Laterality: Left;   TONSILLECTOMY AND ADENOIDECTOMY     child   TOTAL  ABDOMINAL HYSTERECTOMY W/ BILATERAL SALPINGOOPHORECTOMY  03/31/1995   @[facility]  by dr Willis Harter;   TAH/BSO pelvic and periaortic lymphadenectomy   TUBAL LIGATION Bilateral    yrs ago   TUMOR REMOVAL  11/1968   meningioma  T 12   VAGINECTOMY, PARTIAL N/A 08/11/2019   Procedure: TOTAL VAGINECTOMY;  Surgeon: Alphonso Aschoff, MD;  Location: [facility];  Service: Gynecology;  Laterality: N/A;   VAGINECTOMY, PARTIAL N/A 03/20/2021  Procedure: vaginal excision;  Surgeon: Suzi Essex, MD;  Location[facility];  Service: Gynecology;  Laterality: N/A;   VIDEO ASSISTED THORACOSCOPY (VATS)/WEDGE RESECTION Left 08/01/2015   Procedure: VIDEO ASSISTED THORACOSCOPY (VATS)/WEDGE RESECTION;  Surgeon: Zelphia Higashi, MD;  Location: [facility] OR;  Service: Thoracic;  Laterality: Left;   VULVA Asher Blade BIOPSY N/A 02/14/2021   Procedure: VAGINAL AND VULVAR BIOPSY;  Surgeon: Suzi Essex, MD;  Location: [facility];  Service: Gynecology;  Laterality: N/A;   VULVA /PERINEUM BIOPSY N/A 05/07/2022   Procedure: POSSIBLE VAGINAL  BIOPSY;  Surgeon: Suzi Essex, MD;  Location: [facility];  Service: Gynecology;  Laterality: N/A;   VULVAR LESION REMOVAL N/A 02/19/2022   Procedure: VULVAR LESION EXCISION, ROTATIONAL FLAP CLOSURE;  Surgeon: Suzi Essex, MD;  Location: [facility];  Service: Gynecology;  Laterality: N/A;   VULVECTOMY N/A 05/07/2022   Procedure: POSSIBLE WIDE EXCISION VULVECTOMY/VAGINA;  Surgeon: Suzi Essex, MD;  Location: [facility];  Service: Gynecology;  Laterality: N/A;   VULVECTOMY N/A 12/18/2022   Procedure: WIDE EXCISION VULVECTOMY;  Surgeon: Suzi Essex, MD;  Location: [facility];  Service: Gynecology;  Laterality: N/A;   VULVECTOMY PARTIAL  01/13/2017   @[facility]  by dr Willis Harter    Family History  Problem Relation Age of Onset   Hypertension Other    Diabetes Other    Stroke Other     CAD Other    Colon cancer Neg Hx    Breast cancer Neg Hx    Ovarian cancer Neg Hx    Endometrial cancer Neg Hx    Pancreatic cancer Neg Hx    Prostate cancer Neg Hx     Social History   Socioeconomic History   Marital status: Divorced    Spouse name: Not on file   Number of children: 2   Years of education: Not on file   Highest education level: Not on file  Occupational History   Not on file  Tobacco Use   Smoking status: Never   Smokeless tobacco: Never  Vaping Use   Vaping status: Never Used  Substance and Sexual Activity   Alcohol use: Not Currently    Comment: Occ   Drug use: Never   Sexual activity: Yes    Birth control/protection: Surgical  Other Topics Concern   Not on file  Social History Narrative   Lives at home alone, works out at [employer] 6 days a week, still very active. Has an ex-husband who she is in contact with. Son died of an MI and another son in [place].    Social Drivers of Corporate investment banker Strain: Not on file  Food Insecurity: Not on file  Transportation Needs: Not on file  Physical Activity: Not on file  Stress: Not on file  Social Connections: Not on file    Current Medications:  Current Outpatient Medications:    acetaminophen  (TYLENOL ) 500 MG tablet, Take 500-1,000 mg by mouth every 6 (six) hours as needed (pain.)., Disp: , Rfl:    acyclovir (ZOVIRAX) 800 MG tablet, Take 800 mg by mouth 2 (two) times daily as needed (cold sores)., Disp: , Rfl:    amLODipine (NORVASC) 5 MG tablet, Take 5 mg by mouth every evening., Disp: , Rfl:    Ascorbic Acid (VITAMIN C PO), Take 500 mg by mouth in the morning., Disp: , Rfl:    benazepril -hydrochlorthiazide (LOTENSIN  HCT) 20-25 MG tablet, Take 1  tablet by mouth in the morning., Disp: , Rfl:    BIOTIN PO, Take 1 tablet by mouth in the morning., Disp: , Rfl:    Cholecalciferol (VITAMIN D3 PO), Take 2,000 Units by mouth in the morning and at bedtime., Disp: , Rfl:    dexamethasone  (DECADRON )  0.5 MG/5ML solution, Rinse with 1 teaspoon for two minutes four times daily and spit out., Disp: 600 mL, Rfl: 0   dexamethasone  0.5 MG/5ML elixir, Swish with 5mL (1tsp) for 2 minutes four times per day then spit out, Disp: 100 mL, Rfl: 3   docusate sodium  (COLACE) 100 MG capsule, Take 100 mg by mouth 2 (two) times daily., Disp: , Rfl:    doxylamine, Sleep, (UNISOM) 25 MG tablet, Take 12.5 mg by mouth at bedtime., Disp: , Rfl:    ibuprofen  (ADVIL ) 400 MG tablet, Take 400 mg by mouth every 6 (six) hours as needed., Disp: , Rfl:    levothyroxine  (SYNTHROID , LEVOTHROID) 75 MCG tablet, Take 75 mcg by mouth daily before breakfast., Disp: , Rfl:    MAGNESIUM  PO, Take 1 tablet by mouth every evening., Disp: , Rfl:    Multiple Vitamin (MULTIVITAMIN WITH MINERALS) TABS tablet, Take 1 tablet by mouth in the morning., Disp: , Rfl:    Omega-3 Fatty Acids (FISH OIL PO), Take 1 capsule by mouth in the morning., Disp: , Rfl:    Polyethyl Glycol-Propyl Glycol (SYSTANE ULTRA) 0.4-0.3 % SOLN, Place 1-2 drops into both eyes 3 (three) times daily as needed (dry/irritated eyes.)., Disp: , Rfl:    polyethylene glycol (MIRALAX  / GLYCOLAX ) packet, Take 11.3333 g by mouth at bedtime. 2/3 capful, Disp: , Rfl:    simvastatin  (ZOCOR ) 20 MG tablet, Take 20 mg by mouth at bedtime. , Disp: , Rfl:    tacrolimus  (PROTOPIC ) 0.1 % ointment, Apply topically to the vulva twice daily (per Dr. Orvil Bland) (Patient taking differently: Apply topically 2 (two) times daily as needed.), Disp: 30 g, Rfl: 1   TURMERIC PO, Take 1 capsule by mouth every evening., Disp: , Rfl:   Review of Systems: Denies appetite changes, fevers, chills, fatigue, unexplained weight changes. Denies hearing loss, neck lumps or masses, mouth sores, ringing in ears or voice changes. Denies cough or wheezing.  Denies shortness of breath. Denies chest pain or palpitations. Denies leg swelling. Denies abdominal distention, pain, blood in stools, constipation, diarrhea,  nausea, vomiting, or early satiety. Denies pain with intercourse, dysuria, frequency, hematuria or incontinence. Denies hot flashes, pelvic pain, vaginal bleeding or vaginal discharge.   Denies joint pain, back pain or muscle pain/cramps. Denies itching, rash, or wounds. Denies dizziness, headaches, numbness or seizures. Denies swollen lymph nodes or glands, denies easy bruising or bleeding. Denies anxiety, depression, confusion, or decreased concentration.  Physical Exam: BP (!) 152/66 (BP Location: Left Arm, Patient Position: Sitting) Comment: CMA notified  Pulse 63   Temp 98 F (36.7 C) (Oral)   Resp 16   Ht 5\' 5"  (1.651 m)   Wt 152 lb (68.9 kg)   SpO2 100%   BMI 25.29 kg/m  General: Alert, oriented, no acute distress. HEENT: Normocephalic, atraumatic, sclera anicteric. Chest: Unlabored breathing on room air.   GU: Some erythema of the lateral vulva noted, no atypical vascularity.  There is a very small area of leukoplakia along the right mid vulva at approximately 9:00.  No ulcerations.  Flesh-colored raised lesion along the posterior perineum has completely resolved.    Laboratory & Radiologic Studies: None new  Assessment & Plan: Ronny Colas  A [name] Richards is a 78 y.o. 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  After surgery for resection March 2024, plan made for adjuvant chemotherapy.  Did not tolerate carboplatin , started pembrolizumab  setting of a CPS score of 90%. Recurrence diagnosed in October 2024 s/p resection with negative margins on 12/18/22. Given treatment options and overall tolerance of immunotherapy, then had been to restart pembrolizumab .  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   She is overall doing well.  Previously seen lesion that had been biopsied and consistent with granulation tissue has  completely resolved.  She continues to be intermittently symptomatic related to her lichen sclerosis.  No concerning lesions seen on exam today.    We had previously discussed option of vulvar radiation after her last recurrence.  She ultimately decided to cancel her appointment with Dr. Eloise Hake.  Open to radiation in the future.    We will plan on follow-up visit in 3 months.  20 minutes of total time was spent for this patient encounter, including preparation, face-to-face counseling with the patient and coordination of care, and documentation of the encounter.  Wiley Hanger, MD  Division of Gynecologic Oncology  Department of Obstetrics and Gynecology  [facility] [place]  [facility]

## 2023-07-05 DIAGNOSIS — R300 Dysuria: Secondary | ICD-10-CM | POA: Diagnosis not present

## 2023-07-22 ENCOUNTER — Other Ambulatory Visit (HOSPITAL_COMMUNITY): Payer: Self-pay

## 2023-07-22 DIAGNOSIS — L438 Other lichen planus: Secondary | ICD-10-CM | POA: Diagnosis not present

## 2023-07-22 DIAGNOSIS — Z9225 Personal history of immunosupression therapy: Secondary | ICD-10-CM | POA: Diagnosis not present

## 2023-07-22 DIAGNOSIS — K1370 Unspecified lesions of oral mucosa: Secondary | ICD-10-CM | POA: Diagnosis not present

## 2023-07-22 DIAGNOSIS — Z923 Personal history of irradiation: Secondary | ICD-10-CM | POA: Diagnosis not present

## 2023-07-22 DIAGNOSIS — Z85110 Personal history of malignant carcinoid tumor of bronchus and lung: Secondary | ICD-10-CM | POA: Diagnosis not present

## 2023-07-22 DIAGNOSIS — Z9221 Personal history of antineoplastic chemotherapy: Secondary | ICD-10-CM | POA: Diagnosis not present

## 2023-07-22 MED ORDER — DEXAMETHASONE 0.5 MG/5ML PO SOLN
ORAL | 1 refills | Status: DC
  Filled 2023-07-22: qty 300, 15d supply, fill #0
  Filled 2023-07-30 – 2023-08-03 (×2): qty 300, 15d supply, fill #1

## 2023-07-24 ENCOUNTER — Other Ambulatory Visit (HOSPITAL_COMMUNITY): Payer: Self-pay

## 2023-07-24 MED ORDER — AZITHROMYCIN 500 MG PO TABS
500.0000 mg | ORAL_TABLET | Freq: Every day | ORAL | 0 refills | Status: DC
  Filled 2023-07-24: qty 6, 6d supply, fill #0

## 2023-07-30 ENCOUNTER — Other Ambulatory Visit (HOSPITAL_COMMUNITY): Payer: Self-pay

## 2023-07-31 ENCOUNTER — Other Ambulatory Visit (HOSPITAL_COMMUNITY): Payer: Self-pay

## 2023-08-03 ENCOUNTER — Other Ambulatory Visit (HOSPITAL_COMMUNITY): Payer: Self-pay

## 2023-08-12 ENCOUNTER — Other Ambulatory Visit (HOSPITAL_COMMUNITY): Payer: Self-pay

## 2023-08-12 MED ORDER — DEXAMETHASONE 0.5 MG/5ML PO SOLN
ORAL | 3 refills | Status: DC
  Filled 2023-08-12: qty 300, 25d supply, fill #0
  Filled 2023-08-15: qty 300, 15d supply, fill #0
  Filled 2023-08-28: qty 300, 15d supply, fill #1

## 2023-08-15 ENCOUNTER — Other Ambulatory Visit (HOSPITAL_COMMUNITY): Payer: Self-pay

## 2023-08-24 DIAGNOSIS — E559 Vitamin D deficiency, unspecified: Secondary | ICD-10-CM | POA: Diagnosis not present

## 2023-08-24 DIAGNOSIS — I700 Atherosclerosis of aorta: Secondary | ICD-10-CM | POA: Diagnosis not present

## 2023-08-24 DIAGNOSIS — R350 Frequency of micturition: Secondary | ICD-10-CM | POA: Diagnosis not present

## 2023-08-24 DIAGNOSIS — E785 Hyperlipidemia, unspecified: Secondary | ICD-10-CM | POA: Diagnosis not present

## 2023-08-24 DIAGNOSIS — L439 Lichen planus, unspecified: Secondary | ICD-10-CM | POA: Diagnosis not present

## 2023-08-24 DIAGNOSIS — I2510 Atherosclerotic heart disease of native coronary artery without angina pectoris: Secondary | ICD-10-CM | POA: Diagnosis not present

## 2023-08-24 DIAGNOSIS — E039 Hypothyroidism, unspecified: Secondary | ICD-10-CM | POA: Diagnosis not present

## 2023-08-24 DIAGNOSIS — Z85118 Personal history of other malignant neoplasm of bronchus and lung: Secondary | ICD-10-CM | POA: Diagnosis not present

## 2023-08-24 DIAGNOSIS — Z23 Encounter for immunization: Secondary | ICD-10-CM | POA: Diagnosis not present

## 2023-08-24 DIAGNOSIS — I10 Essential (primary) hypertension: Secondary | ICD-10-CM | POA: Diagnosis not present

## 2023-08-24 DIAGNOSIS — Z0000 Encounter for general adult medical examination without abnormal findings: Secondary | ICD-10-CM | POA: Diagnosis not present

## 2023-08-24 DIAGNOSIS — R7303 Prediabetes: Secondary | ICD-10-CM | POA: Diagnosis not present

## 2023-08-25 DIAGNOSIS — H5213 Myopia, bilateral: Secondary | ICD-10-CM | POA: Diagnosis not present

## 2023-08-25 DIAGNOSIS — H35371 Puckering of macula, right eye: Secondary | ICD-10-CM | POA: Diagnosis not present

## 2023-08-26 DIAGNOSIS — M8588 Other specified disorders of bone density and structure, other site: Secondary | ICD-10-CM | POA: Diagnosis not present

## 2023-08-26 DIAGNOSIS — R29890 Loss of height: Secondary | ICD-10-CM | POA: Diagnosis not present

## 2023-08-28 ENCOUNTER — Other Ambulatory Visit (HOSPITAL_COMMUNITY): Payer: Self-pay

## 2023-08-31 ENCOUNTER — Other Ambulatory Visit (HOSPITAL_COMMUNITY): Payer: Self-pay

## 2023-09-10 ENCOUNTER — Telehealth: Payer: Self-pay | Admitting: Oncology

## 2023-09-10 NOTE — Telephone Encounter (Signed)
 [name] Richards and rescheduled her appointment with Dr. Viktoria to 2:45 on 09/18/23.

## 2023-09-18 ENCOUNTER — Inpatient Hospital Stay: Attending: Gynecologic Oncology | Admitting: Gynecologic Oncology

## 2023-09-18 ENCOUNTER — Encounter: Payer: Self-pay | Admitting: Gynecologic Oncology

## 2023-09-18 VITALS — BP 156/82 | HR 70 | Temp 98.8°F | Resp 19 | Wt 153.8 lb

## 2023-09-18 DIAGNOSIS — L439 Lichen planus, unspecified: Secondary | ICD-10-CM | POA: Insufficient documentation

## 2023-09-18 DIAGNOSIS — N9089 Other specified noninflammatory disorders of vulva and perineum: Secondary | ICD-10-CM | POA: Insufficient documentation

## 2023-09-18 DIAGNOSIS — Z9221 Personal history of antineoplastic chemotherapy: Secondary | ICD-10-CM | POA: Diagnosis not present

## 2023-09-18 DIAGNOSIS — Z8542 Personal history of malignant neoplasm of other parts of uterus: Secondary | ICD-10-CM | POA: Insufficient documentation

## 2023-09-18 DIAGNOSIS — C519 Malignant neoplasm of vulva, unspecified: Secondary | ICD-10-CM

## 2023-09-18 DIAGNOSIS — Z8544 Personal history of malignant neoplasm of other female genital organs: Secondary | ICD-10-CM | POA: Diagnosis not present

## 2023-09-18 NOTE — Progress Notes (Signed)
 Gynecologic Oncology Return Clinic Visit  09/18/23  Reason for Visit: surveillance   Treatment History: Oncology History Overview Note  Patient followed due to HX pulmonary nodules noted 2011  Primary cancer of left upper lobe of lung (HCC)   Staging form: Lung, AJCC 7th Edition   - Clinical stage from 09/27/2015: Stage IA (T1b, N0, M0) - Signed by Sherrod Sherrod, MD on 09/27/2015  PDL-1 90%    Primary cancer of left upper lobe of lung (HCC)  07/12/2015 Imaging   CT Chest IMPRESSION:  The solid component of the large part solid nodule in the left upper lobe measures 9 x 3 mm (mean diameter 6 mm.) persistent part solid nodules with solid components greater than or equal to 6 mm should be considered highly suspicious for pulmonary adenocarcinoma   08/01/2015 Initial Diagnosis   Primary cancer of left upper lobe of lung (HCC)   08/01/2015 Surgery   PROCEDURE:  Left video-assisted thoracoscopy,           Wedge resection,           Thoracoscopic lingular sparing left upper lobectomy           Lymph node dissection           On-Q local anesthetic catheter placement   08/01/2015 Pathology Results   Lung, resection (segmental or lobe), Left Upper Lobe - ADENOCARCINOMA, WELL DIFFERENTIATED, SPANNING 2.5 CM. - THE SURGICAL RESECTION MARGINS ARE NEGATIVE FOR CARCINOMA. - THERE IS NO EVIDENCE OF CARCINOMA IN 1 OF 1 LYMPH NODE (   12/18/2022 Surgery   Partial simple left vulvectomy    12/18/2022 Pathology Results   A. PERINEUM, LEFT, EXCISION:  - Invasive well to moderately differentiated keratinizing squamous cell  carcinoma, 2.1 cm  - Carcinoma invades for depth of about 2 mm  - No evidence of lymphovascular invasion  - All resection margins are negative for carcinoma  - Lateral resection margins are negative for dysplasia    Vulvar cancer (HCC)  11/01/2018 Initial Diagnosis   Vulvar cancer   06/03/2022 Cancer Staging   Staging form: Vulva, AJCC 8th Edition - Pathologic stage  from 06/03/2022: Stage IB (pT1b, pN0, cM0) - Signed by Lonn Hicks, MD on 06/03/2022 Stage prefix: Initial diagnosis   12/18/2022 Pathology Results   SURGICAL PATHOLOGY CASE: [id number] [name] Surgical Pathology Report  Clinical History: Recurrent vulvar cancer (crm)  FINAL MICROSCOPIC DIAGNOSIS:  A. 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 She underwent a total abdominal hysterectomy and bilateral salpingo-oophorectomy, pelvic and para-aortic lymphadenectomy. She received postoperative whole pelvis radiation therapy.    02/06/2015 Surgery   In December 2016 the patient was found to have a new primary squamous cell carcinoma the vulva undergoing a modified radical vulvectomy on 02/06/2015. She underwent a modified radical vulvectomy at [facility] on 02/06/2015. She was found to have a 3 cm vulvar lesion of the posterior vulva extending into the posterior vagina. A rhomboid flap was required to close the incision.  Final pathology showed some close surgical margins. However, the specimen margins were toward the time of  surgery and it was Dr Layman impression that they were widely around the primary cancer. She discussed options with the patient and they agreed to monitor her closely but not recommend any radiation therapy to the vulva.   01/13/2017 Surgery   She experienced recurrent vulvar cancer October 2018 managed by a small modified radical vulvectomy of the right side on January 13, 2017.  Depth  of invasion was 4 mm all margins were negative    03/13/2017 PET scan   1. No evidence of recurrent or metastatic disease. 2. Aortic atherosclerosis (ICD10-170.0). Coronary artery calcification.   07/20/2019 Miscellaneous   Examination of the vulva during routine surveillance on 07/20/19 showed a friable, verrucous lesion measuring approximately 2 cm was seen on the anterior wall of the suburethral vagina extending to the introitus and wrapping around to the right mid vagina. This was concerning for recurrence and was biopsied at that appointment. 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    08/11/2019 Miscellaneous   On 08/11/19 she underwent total vaginectomy. Intraoperative findings were significant for a forshortened 3cm vagina, friable 4cm plaque on the anterior vagina extending to the right fornix and left posterior vaginal wall. This necessitated a total vaginectomy to resect the lesion grossly. Her tissues were extremely friable due to age, prior radiation, menopause and lichen sclerosis. There was unavoidable fragmentation of the tissues during the resection. All gross visible disease was removed. Surgery was uncomplicated, and she reported no postop pain.  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 The suspicious foci are present at the edges of tissue fragments.  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        09/13/2019 PET scan   1. No specific evidence of recurrent vulvar carcinoma or distant metastases. There is nonspecific activity in the region of the vaginal introitus which could reflect urine contamination or vaginal recurrence. Correlation with physical examination recommended.  2. No abnormal thoracic activity status post left upper lobe wedge  resection. 3. Coronary and Aortic Atherosclerosis (ICD10-I70.0).   09/29/2019 - 10/12/2019 Radiation Therapy   The patient received adjuvant vaginal brachytherapy between 09/29/2019 through 10/12/2019.     02/28/2021 PET scan   1. Interval decrease in degree of FDG uptake associated with the vaginal introitus. The SUV max on today's study is equal to 5.96 compared with 9.17 previously. 2. No highly concerning features to suggest nodal metastasis or distant metastatic disease. Mildly increased tracer uptake within and morphologically benign left inguinal lymph node is identified which warrants attention on follow-up imaging. 3. Stable postoperative changes within the left upper lobe without signs to suggest locally recurrent tumor. 4. Aortic Atherosclerosis (ICD10-I70.0). Coronary artery calcifications.   03/11/2021 Initial Diagnosis   Recurrent vaginal cancer   03/20/2021 Procedure   03/20/21: Vaginectomy, CO2 laser of the vulva for recurrent vaginal cancer, high-grade vulvar dysplasia.  Vaginal excision showed squamous mucosa with ulceration and inflammation.  No malignancy or dysplasia identified.  Rare atypical radiation fibroblast embedded.    10/24/2021 Imaging   1. No acute abnormality of the abdomen or pelvis. 2. Aortic Atherosclerosis (ICD10-I70.0).   02/19/2022 Surgery   Preop Diagnosis: history of vulvar and vaginal cancer, suspicion for recurrent vulvar cancer   Postoperative Diagnosis: same as above   Surgery: Partial simple right/posterior vulvectomy with rotational rhomboid flap    Surgeons: Viktoria,  Odean MD    Pathology: posterior vulva (6-8 o'c) with stitch at 12, additional superior, lateral and inferior margins, deep margin   Operative findings: 2 x 2.5 cm verrucous appearing lesion along the perineum and extending to the introitus, relatively broad 2 cm base. Given size of defect and its location, required rotational flap to close defect. Hyperkeratotic areas within the  significantly foreshortened vagina.   03/13/2022 PET scan    ADDENDUM REPORT: 03/14/2022 08:44   ADDENDUM: I just discussed this study with Dr. Viktoria. The patient had surgery about 3 weeks ago for recurrent disease at the vaginal introitus with no residual disease apparent clinically. Additionally, the patient has had no chemotherapy in the 1 year interval since the prior study and no radiation therapy to the left groin region. In light of this additional history, the changes in the region the vaginal introitus today may well simply represent urinary contaminant rather than residual disease. Further, given lack of interval systemic chemotherapy and no interval focused therapy to the left groin lymph node, the interval stability in this node (9 mm previously versus 10 mm today) and stable low level FDG accumulation ( SUV max = 2.9 today compared to 3.0 previously) would suggest a reactive etiology as more likely, especially in light of the recurrent surgeries to the external genitalia.     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  Approximately 1 x 1 cm raised mass along the right distal vagina from 6-8 o'clock.  Second raised mass along the left vaginal sidewall extending almost up to the vaginal apex measuring approximately 1 x 1.5 cm.  Several small, less than 5 mm, masses along the vaginal apex and superior wall of the posterior vagina.  Well-healed lumbar incision from recent surgery.  Significant loss of architecture of the vulva with erythema  noted of bilateral anterior labia and along the perineum.  Multiple biopsies taken including 2 and 10:00 along the vulva and at 5:00 along the posterior vulva.     05/07/2022 Pathology Results   CASE: [id number] [name] Surgical Pathology Report  Clinical History: Recurrent vulvar cancer (crm)  FINAL MICROSCOPIC DIAGNOSIS:  A. VAGINAL LESION, DISTAL RIGHT, EXCISION:      Invasive squamous cell carcinoma.      Cauterized carcinoma present at the mucosal margin. Deep margin are negative for carcinoma.  B. VAGINAL SIDEWALL, LEFT, EXCISION:      Invasive squamous cell carcinoma.      Cauterized carcinoma present at the mucosal margin. Deep margin are negative for carcinoma.  C. VAGINA, APEX, EXCISION:      At least squamous cell carcinoma in situ.  D. VAGINA, POSTERIOR, BIOPSY:      At least squamous cell carcinoma in situ.  E. VULVA, RIGHT, BIOPSY:      Squamous mucosa with acute inflammation and reactive epithelial changes.      Negative for dysplasia or malignancy.  F. VULVA, LEFT, BIOPSY:      Squamous mucosa with acute and chronic inflammation and reactive epithelial changes.      Negative for dysplasia or malignancy.  G. VULVA, POSTERIOR, 5:00, BIOPSY:      Squamous mucosa with acute and chronic inflammation and reactive epithelial changes.      Negative for dysplasia or malignancy.     06/03/2022 Cancer Staging   Staging form: Vagina, AJCC 8th Edition - Clinical: cT1, cN0, cM0 - Signed by Lonn Hicks, MD on 06/03/2022   06/09/2022 - 06/09/2022 Chemotherapy   Patient is on Treatment Plan : UTERINE Pembrolizumab  (200) q21d     06/09/2022 - 06/09/2022 Chemotherapy   Patient is on Treatment Plan : UTERINE Carboplatin  (AUC 5) q21d     06/30/2022 - 01/15/2023 Chemotherapy   Patient is on Treatment Plan : UTERINE Pembrolizumab  (400) q42d     09/26/2022 PET scan   NM PET Image Restage (PS) Skull Base to Thigh (F-18 FDG)  Result Date: 09/25/2022 CLINICAL  DATA:  Subsequent treatment strategy for recurrent vaginal cancer. Currently on immunotherapy. Biopsy 05/07/2022. EXAM: NUCLEAR MEDICINE PET SKULL BASE TO THIGH TECHNIQUE: 7.7 mCi F-18 FDG was injected intravenously. Full-ring PET imaging was performed from the skull base to thigh after the radiotracer. CT data was obtained and used for attenuation correction and anatomic localization. Fasting blood glucose: 107 mg/dl COMPARISON:  97/98/7975 FINDINGS: Mediastinal blood pool activity: SUV max 2.2 Liver activity: SUV max NA NECK: No areas of abnormal hypermetabolism. Incidental CT findings: Cerebral atrophy. No cervical adenopathy. Bilateral carotid atherosclerosis. CHEST: No pulmonary parenchymal or thoracic nodal hypermetabolism. Incidental CT findings: Surgical changes about the left hilum and left upper lobe consistent with clinical history of prior wedge resection. Aortic and coronary artery calcification. Esophageal fluid level on 60/4. 3 mm right lower lobe pulmonary nodule on 78/4 is unchanged. ABDOMEN/PELVIS: No abdominopelvic parenchymal hypermetabolism. Abdominopelvic nodal dissection. Mildly hypermetabolic left inguinal nodes. The more cephalad measures 8 mm and a S.U.V. max of 2.6 on 168/4 versus 6 mm and a S.U.V. max of 1.8 on the prior. More caudal measures 1.0 cm and a S.U.V. max of 3.7 on 173/4 versus similar in size and a S.U.V. max of 2.9 on the prior. Incidental CT findings: Scarred upper pole right kidney. Normal adrenal glands. Abdominal aortic atherosclerosis. Hysterectomy. Pelvic floor laxity with mild cystocele. SKELETON: No abnormal marrow activity. Incidental CT findings: Right-greater-than-left hip osteoarthritis. IMPRESSION: 1. Left inguinal nodes which demonstrate mild increase in hypermetabolism. 1 has minimally enlarged. These are again favored to be reactive but warrant follow-up attention. 2. Otherwise, no evidence of recurrent or metastatic disease, status post hysterectomy and  abdominopelvic node dissection. 3. Incidental findings, including: Coronary artery atherosclerosis. Aortic Atherosclerosis (ICD10-I70.0). Esophageal air fluid level suggests dysmotility or gastroesophageal reflux. Electronically Signed   By: Rockey Kilts M.D.   On: 09/25/2022 14:57        Interval History: Doing well.  Continues to have intermittent vulvar irritation and pruritus.  Using a Perry bottle but nothing else topically.  Symptoms stable.  Denies any bleeding or discharge.  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 cath due to past injury   History of benign kidney tumor excluding renal pelvis 07/26/2009   s/p  right partial nephrectomy for angiomyolipoma   History of cervical cancer 03/1995   s/p  TAH /  BSO for poorly differentiated cervical carcinoma.   History of endometrial cancer 03/1995   s/p  TAH/ BSO for cervical cancer w/ incidental finding endometrial cancer also ,  completed radiation 11/ 1997   History of external beam radiation therapy 1997   completed abominal radation for endometrial cancer 11/ 1997   History of radiation therapy 10/12/2019   [facility] HDR brachytherapy  09/29/2019-10/12/2019  Dr Lynwood Nasuti  (recurrent VAIN)   History of resection of meningioma 11/1968   T12   History of small bowel obstruction 12/2008   partial SBO admission 12/2008;  recurrent SBO in 02/2011;  02/ 2017;   so far no surgical intervention (per pt has a narrowed distal colon)   Hyperlipidemia    Hypertension    Hypothyroidism    followed by pcp   Lichen sclerosus of vulva    per pt w/ chronic itching   Nocturia    Pre-diabetes    Primary cancer of left upper lobe of lung (HCC) 08/01/2015   previously followed by  oncologist--- dr claretta--- released 09-13-2018  , pt non-smokerStage 1  Non-small cell ,  no chemo or radiation,  survillance   (bilateral nodules for noted in 2011)   Recurrent vaginal cancer (HCC) 08/2019   GYN oncologist--- dr viktoria---- total vaginectomy and completed radiation 09/ 2021;    laser ablation 02/ 2023   Recurrent vulvar cancer [facility]) 2016   GYN oncologist---- dr viktoria;   new dx 12/ 2016  s/p radica vulvectomy @ [facility] by dr devonna recurrent 10/ 2018 s/p modified vulvecotmy;  06/ 2021 invasive to vagina/ recurrent vulva s/p total vagainectomy & radiation; laser ablation 02/ 2023;   new lesion 12/ 2023 s/p simplec vulectomy w/ flap 01/ 2024    Past Surgical History:  Procedure Laterality Date   CATARACT EXTRACTION W/ INTRAOCULAR LENS IMPLANT Bilateral 2006   CO2 LASER APPLICATION N/A 03/20/2021   Procedure: CO2 LASER APPLICATION to vagina and vuvla;  Surgeon: viktoria Comer SAUNDERS, MD;  Location: [facility];  Service: Gynecology;  Laterality: N/A;   CO2 LASER APPLICATION N/A 05/07/2022   Procedure: POSSIBLE CO2 LASER APPLICATION TO VAGINA/VULVA;  Surgeon: viktoria Comer SAUNDERS, MD;  Location: [facility];  Service: Gynecology;  Laterality: N/A;   LYMPH NODE DISSECTION Left 08/01/2015   Procedure: LYMPH NODE DISSECTION;  Surgeon: Elspeth JAYSON Millers, MD;  Location: [facility];  Service: Thoracic;  Laterality: Left;   RADICAL VULVECTOMY  02/06/2015   @[facility]  by dr devonna   ROBOTIC ASSITED PARTIAL NEPHRECTOMY  07/26/2009   @[facility]  by dr renda   SEGMENTECOMY Left 08/01/2015   Procedure: Left Upper Lobe SEGMENTECTOMY;  Surgeon: Elspeth JAYSON Millers, MD;  Location: [facility] OR;  Service: Thoracic;  Laterality: Left;   TONSILLECTOMY AND ADENOIDECTOMY     child   TOTAL ABDOMINAL HYSTERECTOMY W/ BILATERAL SALPINGOOPHORECTOMY  03/31/1995   @[facility]  by dr devonna;   TAH/BSO pelvic and periaortic lymphadenectomy   TUBAL LIGATION Bilateral    yrs ago   TUMOR  REMOVAL  11/1968   meningioma  T 12   VAGINECTOMY, PARTIAL N/A 08/11/2019   Procedure: TOTAL VAGINECTOMY;  Surgeon: Eloy Herring, MD;  Location: [facility];  Service: Gynecology;  Laterality: N/A;   VAGINECTOMY, PARTIAL N/A 03/20/2021   Procedure: vaginal excision;  Surgeon: viktoria Comer SAUNDERS, MD;  Location: [facility];  Service: Gynecology;  Laterality: N/A;   VIDEO ASSISTED THORACOSCOPY (VATS)/WEDGE RESECTION Left 08/01/2015   Procedure: VIDEO ASSISTED THORACOSCOPY (VATS)/WEDGE  RESECTION;  Surgeon: Elspeth JAYSON Millers, MD;  Location: [facility] OR;  Service: Thoracic;  Laterality: Left;   VULVA MARYBETH BIOPSY N/A 02/14/2021   Procedure: VAGINAL AND VULVAR BIOPSY;  Surgeon: Viktoria Comer SAUNDERS, MD;  Location: [facility];  Service: Gynecology;  Laterality: N/A;   VULVA /PERINEUM BIOPSY N/A 05/07/2022   Procedure: POSSIBLE VAGINAL  BIOPSY;  Surgeon: Viktoria Comer SAUNDERS, MD;  Location: [facility];  Service: Gynecology;  Laterality: N/A;   VULVAR LESION REMOVAL N/A 02/19/2022   Procedure: VULVAR LESION EXCISION, ROTATIONAL FLAP CLOSURE;  Surgeon: Viktoria Comer SAUNDERS, MD;  Location: [facility];  Service: Gynecology;  Laterality: N/A;   VULVECTOMY N/A 05/07/2022   Procedure: POSSIBLE WIDE EXCISION VULVECTOMY/VAGINA;  Surgeon: Viktoria Comer SAUNDERS, MD;  Location: [facility];  Service: Gynecology;  Laterality: N/A;   VULVECTOMY N/A 12/18/2022   Procedure: WIDE EXCISION VULVECTOMY;  Surgeon: Viktoria Comer SAUNDERS, MD;  Location: [facility];  Service: Gynecology;  Laterality: N/A;   VULVECTOMY PARTIAL  01/13/2017   @[facility]  by dr devonna    Family History  Problem Relation Age of Onset   Hypertension Other    Diabetes Other    Stroke Other    CAD Other    Colon cancer Neg Hx    Breast cancer Neg Hx    Ovarian cancer Neg Hx    Endometrial cancer Neg Hx    Pancreatic cancer Neg Hx    Prostate cancer Neg Hx     Social History    Socioeconomic History   Marital status: Divorced    Spouse name: Not on file   Number of children: 2   Years of education: Not on file   Highest education level: Not on file  Occupational History   Not on file  Tobacco Use   Smoking status: Never   Smokeless tobacco: Never  Vaping Use   Vaping status: Never Used  Substance and Sexual Activity   Alcohol use: Not Currently    Comment: Occ   Drug use: Never   Sexual activity: Yes    Birth control/protection: Surgical  Other Topics Concern   Not on file  Social History Narrative   Lives at home alone, works out at [employer] 6 days a week, still very active. Has an ex-husband who she is in contact with. Son died of an MI and another son in [place].    Social Drivers of Corporate investment banker Strain: Not on file  Food Insecurity: Not on file  Transportation Needs: Not on file  Physical Activity: Not on file  Stress: Not on file  Social Connections: Not on file    Current Medications:  Current Outpatient Medications:    acetaminophen  (TYLENOL ) 500 MG tablet, Take 500-1,000 mg by mouth every 6 (six) hours as needed (pain.)., Disp: , Rfl:    acyclovir (ZOVIRAX) 800 MG tablet, Take 800 mg by mouth 2 (two) times daily as needed (cold sores)., Disp: , Rfl:    amLODipine (NORVASC) 5 MG tablet, Take 5 mg by mouth every evening., Disp: , Rfl:    Ascorbic Acid (VITAMIN C PO), Take 500 mg by mouth in the morning., Disp: , Rfl:    benazepril -hydrochlorthiazide (LOTENSIN  HCT) 20-25 MG tablet, Take 1 tablet by mouth in the morning., Disp: , Rfl:    BIOTIN PO, Take 1 tablet by mouth in the morning., Disp: , Rfl:    Cholecalciferol (VITAMIN D3 PO), Take 2,000 Units by mouth  in the morning and at bedtime., Disp: , Rfl:    dexamethasone  (DECADRON ) 0.5 MG/5ML solution, Rinse with a teaspoonful for 2 minutes then spit, use 4 times a day, Disp: 300 mL, Rfl: 3   docusate sodium  (COLACE) 100 MG capsule, Take 100 mg by mouth 2 (two) times  daily., Disp: , Rfl:    doxylamine, Sleep, (UNISOM) 25 MG tablet, Take 12.5 mg by mouth at bedtime., Disp: , Rfl:    ibuprofen  (ADVIL ) 400 MG tablet, Take 400 mg by mouth every 6 (six) hours as needed., Disp: , Rfl:    levothyroxine  (SYNTHROID , LEVOTHROID) 75 MCG tablet, Take 75 mcg by mouth daily before breakfast., Disp: , Rfl:    MAGNESIUM  PO, Take 1 tablet by mouth every evening., Disp: , Rfl:    Multiple Vitamin (MULTIVITAMIN WITH MINERALS) TABS tablet, Take 1 tablet by mouth in the morning., Disp: , Rfl:    Omega-3 Fatty Acids (FISH OIL PO), Take 1 capsule by mouth in the morning., Disp: , Rfl:    polyethylene glycol (MIRALAX  / GLYCOLAX ) packet, Take 11.3333 g by mouth at bedtime. 2/3 capful, Disp: , Rfl:    simvastatin  (ZOCOR ) 20 MG tablet, Take 20 mg by mouth at bedtime. , Disp: , Rfl:    TURMERIC PO, Take 1 capsule by mouth every evening., Disp: , Rfl:    dexamethasone  0.5 MG/5ML elixir, Swish with 5mL (1tsp) for 2 minutes four times per day then spit out, Disp: 100 mL, Rfl: 3  Review of Systems: + itching Denies appetite changes, fevers, chills, fatigue, unexplained weight changes. Denies hearing loss, neck lumps or masses, mouth sores, ringing in ears or voice changes. Denies cough or wheezing.  Denies shortness of breath. Denies chest pain or palpitations. Denies leg swelling. Denies abdominal distention, pain, blood in stools, constipation, diarrhea, nausea, vomiting, or early satiety. Denies pain with intercourse, dysuria, frequency, hematuria or incontinence. Denies hot flashes, pelvic pain, vaginal bleeding or vaginal discharge.   Denies joint pain, back pain or muscle pain/cramps. Denies rash, or wounds. Denies dizziness, headaches, numbness or seizures. Denies swollen lymph nodes or glands, denies easy bruising or bleeding. Denies anxiety, depression, confusion, or decreased concentration.  Physical Exam: BP (!) 178/58 (BP Location: Right Arm, Patient Position: Sitting)    Pulse 70   Temp 98.8 F (37.1 C) (Oral)   Resp 19   Wt 153 lb 12.8 oz (69.8 kg)   SpO2 98%   BMI 25.59 kg/m  General: Alert, oriented, no acute distress. HEENT: Normocephalic, atraumatic, sclera anicteric. Chest: Unlabored breathing on room air.  Lungs clear to auscultation bilaterally. Cardiovascular: Regular rate and rhythm, no murmurs or rubs appreciated. Abdomen: Soft, nondistended, nontender.   GU: Some erythema of the lateral vulva noted, no atypical vascularity.  There is a very small area of leukoplakia along the right mid vulva at approximately 9:00, similar in appearance.  Along the inner aspect of the labia at approximately 4:00, there is a less than 1 cm x 3 mm ridge of white tissue with punctations, new since her last exam.    Vulvar biopsy procedure Preoperative diagnosis: Vulvar lesion Postoperative diagnosis: Same as above Physician: Viktoria MD Estimated blood loss: Minimal Specimens: Vulvar biopsy at 4 o'clock Procedure: After the procedure was discussed with the patient, she gave verbal consent.  She was already in dorsal lithotomy position with the lesion visualized. The vulvar lesion was then cleansed with Betadine x 3.  Injected for local anesthesia.  After adequate time was given, Tischler forceps  were used to take a biopsy from the posterior edge of the lesion.  This was placed in formalin.  Silver  nitrate was used to achieve hemostasis.  Overall the patient tolerated the procedure well.    Laboratory & Radiologic Studies: None new  Assessment & Plan: [name] Richards is a 78 y.o. 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  After surgery for resection March 2024, plan made for adjuvant chemotherapy.  Did not tolerate carboplatin , started pembrolizumab  setting of a CPS score of 90%. Recurrence diagnosed in October 2024 s/p resection with negative margins on 12/18/22. Given  treatment options and overall tolerance of immunotherapy, decision then had been to restart pembrolizumab .  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   She is overall doing well.  New small left-sided labial lesion seen and biopsy performed today.  The appearance of this lesion is different than her recent recurrences of invasive carcinoma.    She continues to be intermittently symptomatic related to her lichen sclerosis.     We had previously discussed option of vulvar radiation after her last recurrence.  She ultimately decided to cancel her appointment with Dr. Shannon.  Open to radiation in the future.     We will plan on follow-up visit in 3 months.  I will call her with biopsy results.  20 minutes of total time was spent for this patient encounter, including preparation, face-to-face counseling with the patient and coordination of care, and documentation of the encounter.  Comer Dollar, MD  Division of Gynecologic Oncology  Department of Obstetrics and Gynecology  [facility]

## 2023-09-18 NOTE — Patient Instructions (Signed)
 It was good to see you today.  I will call you with your biopsy results next week.  We will tentatively plan on a follow-up visit in 3 months.  Please let me know if you have any change in symptoms before then.

## 2023-09-22 ENCOUNTER — Ambulatory Visit: Payer: Self-pay | Admitting: Gynecologic Oncology

## 2023-09-22 LAB — SURGICAL PATHOLOGY

## 2023-09-23 DIAGNOSIS — I10 Essential (primary) hypertension: Secondary | ICD-10-CM | POA: Diagnosis not present

## 2023-09-23 DIAGNOSIS — M8588 Other specified disorders of bone density and structure, other site: Secondary | ICD-10-CM | POA: Diagnosis not present

## 2023-09-23 DIAGNOSIS — L439 Lichen planus, unspecified: Secondary | ICD-10-CM | POA: Diagnosis not present

## 2023-09-26 ENCOUNTER — Encounter (HOSPITAL_BASED_OUTPATIENT_CLINIC_OR_DEPARTMENT_OTHER): Payer: Self-pay | Admitting: Emergency Medicine

## 2023-09-26 ENCOUNTER — Other Ambulatory Visit: Payer: Self-pay

## 2023-09-26 ENCOUNTER — Emergency Department (HOSPITAL_BASED_OUTPATIENT_CLINIC_OR_DEPARTMENT_OTHER): Admission: EM | Admit: 2023-09-26 | Discharge: 2023-09-26 | Disposition: A | Discharge status: Home / Self Care

## 2023-09-26 ENCOUNTER — Emergency Department (HOSPITAL_BASED_OUTPATIENT_CLINIC_OR_DEPARTMENT_OTHER)

## 2023-09-26 DIAGNOSIS — I10 Essential (primary) hypertension: Secondary | ICD-10-CM | POA: Diagnosis not present

## 2023-09-26 DIAGNOSIS — E876 Hypokalemia: Secondary | ICD-10-CM | POA: Insufficient documentation

## 2023-09-26 DIAGNOSIS — R42 Dizziness and giddiness: Secondary | ICD-10-CM

## 2023-09-26 DIAGNOSIS — Z79899 Other long term (current) drug therapy: Secondary | ICD-10-CM | POA: Insufficient documentation

## 2023-09-26 DIAGNOSIS — I639 Cerebral infarction, unspecified: Secondary | ICD-10-CM | POA: Diagnosis not present

## 2023-09-26 LAB — URINALYSIS, ROUTINE W REFLEX MICROSCOPIC
Bilirubin Urine: NEGATIVE
Glucose, UA: NEGATIVE mg/dL
Ketones, ur: NEGATIVE mg/dL
Nitrite: NEGATIVE
Protein, ur: NEGATIVE mg/dL
Specific Gravity, Urine: 1.009 (ref 1.005–1.030)
pH: 7 (ref 5.0–8.0)

## 2023-09-26 LAB — COMPREHENSIVE METABOLIC PANEL WITH GFR
ALT: 19 U/L (ref 0–44)
AST: 21 U/L (ref 15–41)
Albumin: 4.4 g/dL (ref 3.5–5.0)
Alkaline Phosphatase: 102 U/L (ref 38–126)
Anion gap: 14 (ref 5–15)
BUN: 21 mg/dL (ref 8–23)
CO2: 26 mmol/L (ref 22–32)
Calcium: 10.4 mg/dL — ABNORMAL HIGH (ref 8.9–10.3)
Chloride: 93 mmol/L — ABNORMAL LOW (ref 98–111)
Creatinine, Ser: 0.61 mg/dL (ref 0.44–1.00)
GFR, Estimated: >60 mL/min (ref >60)
Glucose, Bld: 132 mg/dL — ABNORMAL HIGH (ref 70–99)
Potassium: 3.3 mmol/L — ABNORMAL LOW (ref 3.5–5.1)
Sodium: 132 mmol/L — ABNORMAL LOW (ref 135–145)
Total Bilirubin: 0.3 mg/dL (ref 0.0–1.2)
Total Protein: 7.1 g/dL (ref 6.5–8.1)

## 2023-09-26 LAB — MAGNESIUM: Magnesium: 2.1 mg/dL (ref 1.7–2.4)

## 2023-09-26 LAB — TSH: TSH: 1.76 u[IU]/mL (ref 0.350–4.500)

## 2023-09-26 LAB — CBC
HCT: 38.2 % (ref 36.0–46.0)
Hemoglobin: 12.3 g/dL (ref 12.0–15.0)
MCH: 21.8 pg — ABNORMAL LOW (ref 26.0–34.0)
MCHC: 32.2 g/dL (ref 30.0–36.0)
MCV: 67.7 fL — ABNORMAL LOW (ref 80.0–100.0)
Platelets: 250 K/uL (ref 150–400)
RBC: 5.64 MIL/uL — ABNORMAL HIGH (ref 3.87–5.11)
RDW: 15.2 % (ref 11.5–15.5)
WBC: 5.8 K/uL (ref 4.0–10.5)
nRBC: 0 % (ref 0.0–0.2)

## 2023-09-26 MED ORDER — SODIUM CHLORIDE 0.9 % IV BOLUS
500.0000 mL | Freq: Once | INTRAVENOUS | Status: AC
  Administered 2023-09-26: 500 mL via INTRAVENOUS

## 2023-09-26 MED ORDER — POTASSIUM CHLORIDE CRYS ER 20 MEQ PO TBCR
40.0000 meq | EXTENDED_RELEASE_TABLET | Freq: Once | ORAL | Status: AC
  Administered 2023-09-26: 40 meq via ORAL
  Filled 2023-09-26: qty 2

## 2023-09-26 NOTE — ED Triage Notes (Addendum)
 192/87 today. Normally 130's. Had episode of dizziness and light-headed feelings yesterday while inactive- near syncope- denies CP, SOB, N/V/D. Denies HA, vision changes today- 200's sys BP in triage.

## 2023-09-26 NOTE — ED Provider Notes (Signed)
 [place] EMERGENCY DEPARTMENT AT [facility] Provider Note   CSN: [id number] Arrival date & time: 09/26/23  1754     Patient presents with: No chief complaint on file.   [name] Richards is a 78 y.o. female.   HPI  Patient presents because of vertigo.  Patient states that she was driving to the gym yesterday when she subsequently started feeling vertiginous and somewhat dizzy.  No chest pain or shortness of breath.  No vision changes.  No diplopia.  No speech difficulties.  No numbness or weakness anywhere.  Patient states that subsequently resolved.  Happened again today whenever she was going from sitting to a lying down position.  Felt like it was subsequently worse.  She took her blood pressure shortly thereafter and found it to be high with systolics in 160s.  She took her blood pressure couple more times and continually found it to be high.  Because of this, she came into the ED for further evaluation.  Currently, which is sitting at rest, denies any kind of vertiginous episodes.  She states that gets worse whenever she lies down flat or sometimes when she stands up.  Gets nauseous with this as well.  No vision changes has moment of time.  No numbness or tingling aware.  Denies all chest pain or shortness of breath.  No dysuria.  No abdominal pain  States that she takes amlodipine 2 mg at night.  Also takes lotensin  hct in the morning for blood pressure.  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  She went to see her PCP earlier this week where she had a elevated systolic but medication was not changed because her blood pressure is always high when around doctors.     Prior to Admission medications   Medication Sig Start Date End Date Taking? Authorizing Provider  acetaminophen  (TYLENOL ) 500 MG tablet Take 500-1,000 mg by mouth every 6 (six) hours as needed (pain.).    [provider]  acyclovir (ZOVIRAX) 800 MG tablet Take 800 mg by mouth 2 (two) times  daily as needed (cold sores). 02/07/21   [provider]  amLODipine (NORVASC) 5 MG tablet Take 5 mg by mouth every evening. 01/10/21   [provider]  Ascorbic Acid (VITAMIN C PO) Take 500 mg by mouth in the morning.    [provider]  benazepril -hydrochlorthiazide (LOTENSIN  HCT) 20-25 MG tablet Take 1 tablet by mouth in the morning.    [provider]  BIOTIN PO Take 1 tablet by mouth in the morning.    [provider]  Cholecalciferol (VITAMIN D3 PO) Take 2,000 Units by mouth in the morning and at bedtime.    [provider]  dexamethasone  (DECADRON ) 0.5 MG/5ML solution Rinse with a teaspoonful for 2 minutes then spit, use 4 times a day 08/12/23     docusate sodium  (COLACE) 100 MG capsule Take 100 mg by mouth 2 (two) times daily.    [provider]  doxylamine, Sleep, (UNISOM) 25 MG tablet Take 12.5 mg by mouth at bedtime.    [provider]  ibuprofen  (ADVIL ) 400 MG tablet Take 400 mg by mouth every 6 (six) hours as needed.    [provider]  levothyroxine  (SYNTHROID , LEVOTHROID) 75 MCG tablet Take 75 mcg by mouth daily before breakfast.    [provider]  MAGNESIUM  PO Take 1 tablet by mouth every evening.    [provider]  Multiple Vitamin (MULTIVITAMIN WITH MINERALS) TABS tablet Take  1 tablet by mouth in the morning.    [provider]  Omega-3 Fatty Acids (FISH OIL PO) Take 1 capsule by mouth in the morning.    [provider]  polyethylene glycol (MIRALAX  / GLYCOLAX ) packet Take 11.3333 g by mouth at bedtime. 2/3 capful    [provider]  simvastatin  (ZOCOR ) 20 MG tablet Take 20 mg by mouth at bedtime.     [provider]  TURMERIC PO Take 1 capsule by mouth every evening.    [provider]    Allergies: Penicillins, Fentanyl , Tape, Codeine, Compazine [prochlorperazine], Percocet [oxycodone-acetaminophen ], and Trazodone  hcl    Review of  Systems  Constitutional:  Negative for chills and fever.  HENT:  Negative for ear pain and sore throat.   Eyes:  Negative for pain and visual disturbance.  Respiratory:  Negative for cough and shortness of breath.   Cardiovascular:  Negative for chest pain and palpitations.  Gastrointestinal:  Negative for abdominal pain and vomiting.  Genitourinary:  Negative for dysuria and hematuria.  Musculoskeletal:  Negative for arthralgias and back pain.  Skin:  Negative for color change and rash.  Neurological:  Negative for seizures and syncope.  All other systems reviewed and are negative.   Updated Vital Signs BP (!) 179/67   Pulse (!) 51   Temp 98.3 F (36.8 C)   Resp (!) 22   SpO2 99%   Physical Exam Vitals and nursing note reviewed.  Constitutional:      General: She is not in acute distress.    Appearance: She is well-developed.  HENT:     Head: Normocephalic and atraumatic.  Eyes:     General: No visual field deficit.    Conjunctiva/sclera: Conjunctivae normal.  Cardiovascular:     Rate and Rhythm: Normal rate and regular rhythm.     Heart sounds: No murmur heard. Pulmonary:     Effort: Pulmonary effort is normal. No respiratory distress.     Breath sounds: Normal breath sounds.  Abdominal:     Palpations: Abdomen is soft.     Tenderness: There is no abdominal tenderness.  Musculoskeletal:        General: No swelling.     Cervical back: Neck supple.  Skin:    General: Skin is warm and dry.     Capillary Refill: Capillary refill takes less than 2 seconds.  Neurological:     General: No focal deficit present.     Mental Status: She is alert and oriented to person, place, and time.     GCS: GCS eye subscore is 4. GCS verbal subscore is 5. GCS motor subscore is 6.     Cranial Nerves: Cranial nerves 2-12 are intact. No cranial nerve deficit, dysarthria or facial asymmetry.     Sensory: Sensation is intact. No sensory deficit.     Motor: Motor function is intact. No  weakness or pronator drift.     Coordination: Coordination is intact. Romberg sign negative. Coordination normal. Finger-Nose-Finger Test and Heel to [facility] Test normal.     Gait: Gait is intact. Gait normal.  Psychiatric:        Mood and Affect: Mood normal.     (all labs ordered are listed, but only abnormal results are displayed) Labs Reviewed  COMPREHENSIVE METABOLIC PANEL WITH GFR - Abnormal; Notable for the following components:      Result Value   Sodium 132 (*)    Potassium 3.3 (*)    Chloride 93 (*)  Glucose, Bld 132 (*)    Calcium 10.4 (*)    All other components within normal limits  CBC - Abnormal; Notable for the following components:   RBC 5.64 (*)    MCV 67.7 (*)    MCH 21.8 (*)    All other components within normal limits  URINALYSIS, ROUTINE W REFLEX MICROSCOPIC - Abnormal; Notable for the following components:   Color, Urine COLORLESS (*)    Hgb urine dipstick TRACE (*)    Leukocytes,Ua MODERATE (*)    Bacteria, UA RARE (*)    All other components within normal limits  MAGNESIUM   TSH    EKG: EKG Interpretation Date/Time:  Saturday September 26 2023 18:07:35 EDT Ventricular Rate:  60 PR Interval:  140 QRS Duration:  112 QT Interval:  450 QTC Calculation: 450 R Axis:   -11  Text Interpretation: Normal sinus rhythm Minimal voltage criteria for LVH, may be normal variant ( Cornell product ) Abnormal ECG When compared with ECG of 06-Feb-2022 14:47, No significant change was found Confirmed by Simon Rea [phone]) on 09/26/2023 6:33:40 PM  Radiology: CT Head Wo Contrast Result Date: 09/26/2023 CLINICAL DATA:  Dizziness. rule out any obvious subacute posterior cva EXAM: CT HEAD WITHOUT CONTRAST TECHNIQUE: Contiguous axial images were obtained from the base of the skull through the vertex without intravenous contrast. RADIATION DOSE REDUCTION: This exam was performed according to the departmental dose-optimization program which includes automated exposure control,  adjustment of the mA and/or kV according to patient size and/or use of iterative reconstruction technique. COMPARISON:  04/14/2003 FINDINGS: Brain: Mild age related volume loss. No acute intracranial abnormality. Specifically, no hemorrhage, hydrocephalus, mass lesion, acute infarction, or significant intracranial injury. Vascular: No hyperdense vessel or unexpected calcification. Skull: No acute calvarial abnormality. Sinuses/Orbits: No acute findings Other: None IMPRESSION: No acute intracranial abnormality. Electronically Signed   By: Franky Crease M.D.   On: 09/26/2023 19:31     Procedures   Medications Ordered in the ED  sodium chloride  0.9 % bolus 500 mL (0 mLs Intravenous Stopped 09/26/23 2030)  potassium chloride  SA (KLOR-CON  M) CR tablet 40 mEq (40 mEq Oral Given 09/26/23 1913)                                    Medical Decision Making Amount and/or Complexity of Data Reviewed Labs: ordered. Radiology: ordered.  Risk Prescription drug management.   Patient presents because of vertigo.  Patient states that she was driving to the gym yesterday when she subsequently started feeling vertiginous and somewhat dizzy.  No chest pain or shortness of breath.  No vision changes.  No diplopia.  No speech difficulties.  No numbness or weakness anywhere.  Patient states that subsequently resolved.  Happened again today whenever she was going from sitting to a lying down position.  Felt like it was subsequently worse.  She took her blood pressure shortly thereafter and found it to be high with systolics in 160s.  She took her blood pressure couple more times and continually found it to be high.  Because of this, she came into the ED for further evaluation.  Currently, which is sitting at rest, denies any kind of vertiginous episodes.  She states that gets worse whenever she lies down flat or sometimes when she stands up.  Gets nauseous with this as well.  No vision changes has moment of time.  No  numbness or tingling  aware.  Denies all chest pain or shortness of breath.  No dysuria.  No abdominal pain  States that she takes amlodipine 10 mg at night.  Also takes lotensin  hct in the morning for blood pressure.  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  She went to see her PCP earlier this week where she had a elevated systolic but medication was not changed because her blood pressure is always high when around doctors.    Upon examination, patient ANO x 3 GCS 15.   In terms of patient's neurologic exam, NIH is 0.  Cranials 2 through 12 intact.  Strength and sensation intact in bilateral upper and lower extremities.  Romberg negative.  Able to walk in a steady manner.  Normal finger-nose.  Normal heel-to-shin.  Patient does have a slight horizontal saccade with horizontal nystagmus.  No vertical or rotary.  Normal test of skew test.  This is all pointing toward more of a peripheral cause.  CT head did not show account of evidence of any prior subacute CVA in the posterior region.   In terms of patient's hypertension.  Do not think this is driving patient's vertigo.  Did not treat patient's hypertension and patient's blood pressure subsequently came down with systolics in the 150s to 160s.  Recommend patient keep track of her blood pressure going forward with 2 times per day measurements and write this down to take to PCP.  CT head benign.   Potassium 3.3.  Replaced orally.  Sodium of 132.  Around patient's baseline.  Do not think this is driving patient's presentation.  Cardiac telemetry interpreted by me.  Normal sinus rhythm.  Episodes of sinus bradycardia which is patient's baseline.       Final diagnoses:  Vertigo  Hypokalemia  Hypertension, unspecified type    ED Discharge Orders     None          Simon Lavonia SAILOR, MD 09/26/23 2317

## 2023-09-26 NOTE — Discharge Instructions (Signed)
 Thank you vertigo is likely related to a peripheral cause based off my examination as well as your imaging.   As we discussed, if you have these episodes as well as any speech difficulty, any numbness or tingling then please come at the ED for immediate intervention.   Please follow-up with your primary care physician as we discussed regarding your blood pressure

## 2023-09-28 DIAGNOSIS — M8588 Other specified disorders of bone density and structure, other site: Secondary | ICD-10-CM | POA: Diagnosis not present

## 2023-09-28 DIAGNOSIS — R42 Dizziness and giddiness: Secondary | ICD-10-CM | POA: Diagnosis not present

## 2023-09-28 DIAGNOSIS — I10 Essential (primary) hypertension: Secondary | ICD-10-CM | POA: Diagnosis not present

## 2023-11-25 DIAGNOSIS — M47816 Spondylosis without myelopathy or radiculopathy, lumbar region: Secondary | ICD-10-CM | POA: Diagnosis not present

## 2023-11-25 DIAGNOSIS — M5459 Other low back pain: Secondary | ICD-10-CM | POA: Diagnosis not present

## 2023-11-25 DIAGNOSIS — M25551 Pain in right hip: Secondary | ICD-10-CM | POA: Diagnosis not present

## 2023-11-25 DIAGNOSIS — M25562 Pain in left knee: Secondary | ICD-10-CM | POA: Diagnosis not present

## 2023-11-25 DIAGNOSIS — M545 Low back pain, unspecified: Secondary | ICD-10-CM | POA: Diagnosis not present

## 2023-11-25 DIAGNOSIS — M1611 Unilateral primary osteoarthritis, right hip: Secondary | ICD-10-CM | POA: Diagnosis not present

## 2023-12-20 IMAGING — CT NM PET TUM IMG RESTAG (PS) SKULL BASE T - THIGH
9 of 10 series · 24 of 25 positions shown · non-contrast
Comparison: 09/13/2019

CLINICAL DATA: Subsequent treatment strategy for recurrent vulvar
cancer.

EXAM:
NUCLEAR MEDICINE PET SKULL BASE TO THIGH
TECHNIQUE: 7.5 mCi F-18 FDG was injected intravenously. Full-ring PET imaging
was performed from the skull base to thigh after the radiotracer. CT
data was obtained and used for attenuation correction and anatomic
localization.
Fasting blood glucose: 108 mg/dl

[Series 3: pet sk_thigh ac · axial · 5.0mm · 4.07mm/px · z∈[-1524,-632]mm · 4 of 224 slices shown]
[im 1/224]
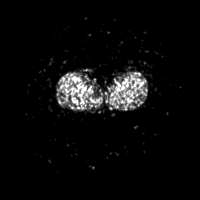
[im 75/224]
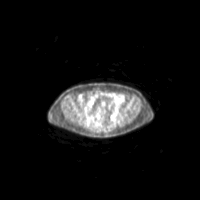
[im 149/224]
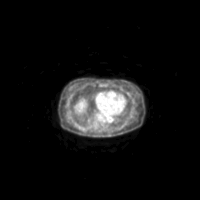
[im 224/224]
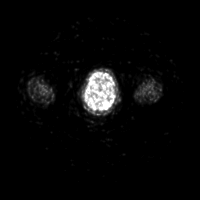

[Series 4: ct sk_thigh 5.0 bf37 · axial · 5.0mm · 0.98mm/px · z∈[-1524,-632]mm · 4 of 224 slices shown]
[im 1/224]
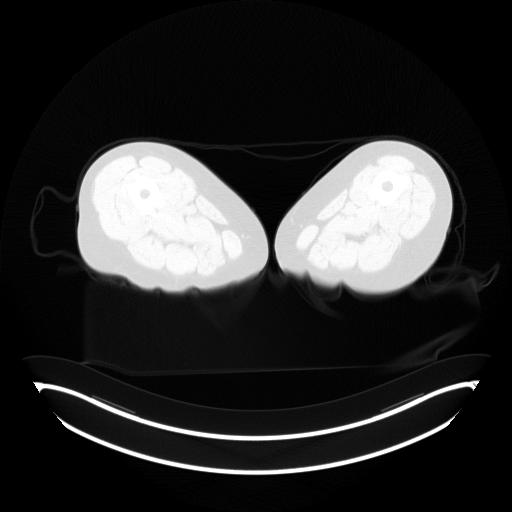
[im 75/224]
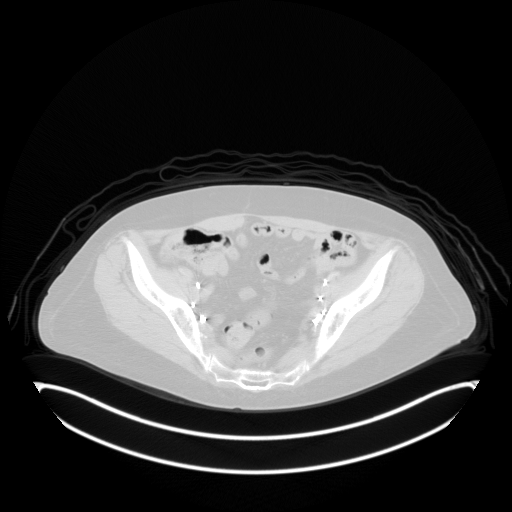
[im 149/224]
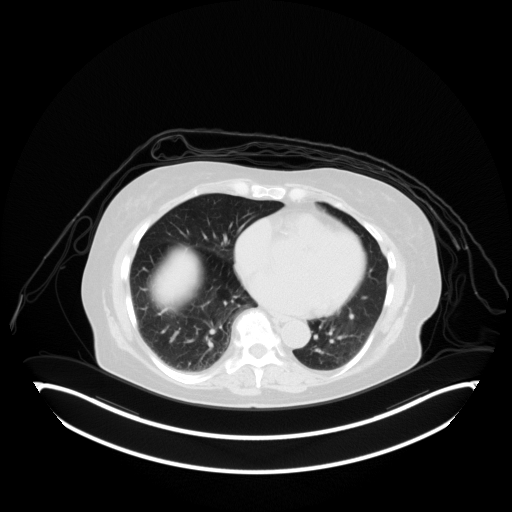
[im 224/224  brain]
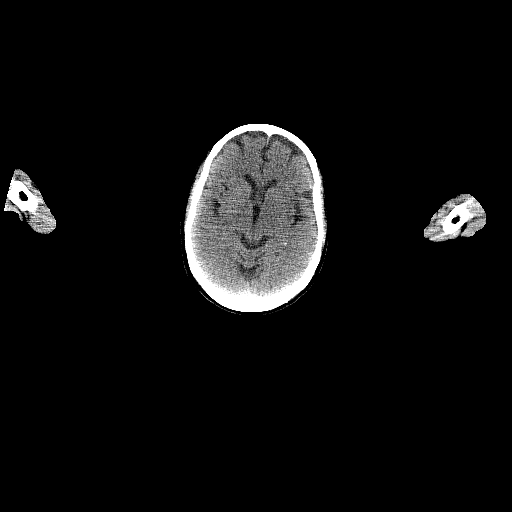

[Series 5: pet sk_thigh nac · axial · 5.0mm · 4.07mm/px · z∈[-1524,-632]mm · 4 of 224 slices shown]
[im 1/224]
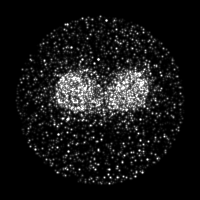
[im 75/224]
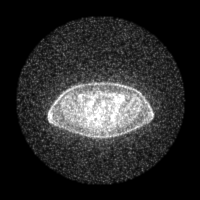
[im 149/224]
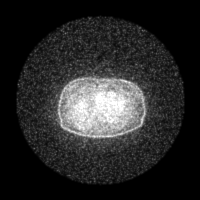
[im 224/224]
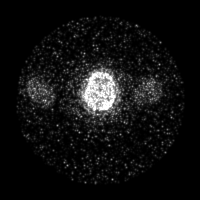

[Series 603: <mip collection> · coronal · 1.85mm/px · 1 of 32 slices shown (1 of 2)]
[im 1/32]
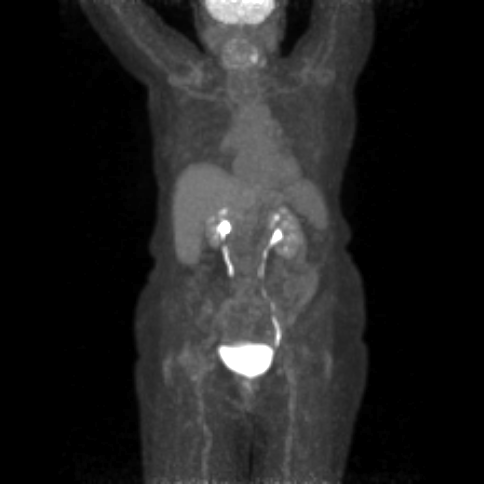

[Series 604: fused cor · 1 of 35 slices shown (1 of 2)]
[im 1/35]
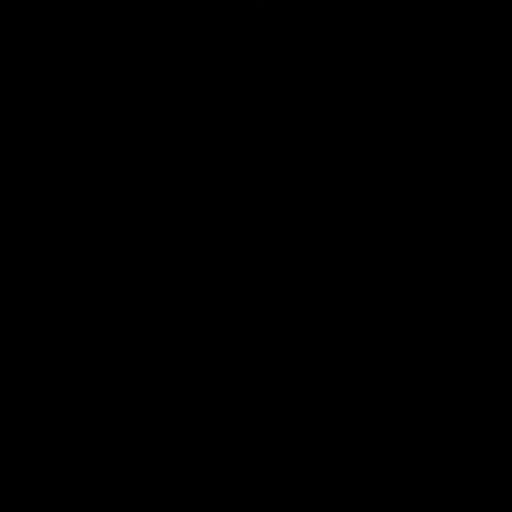

[Series 605: range-ct sk_thigh 5.0 bf37-tra-<alpha range> · 4 of 219 slices shown (1 of 2)]
[im 1/219]
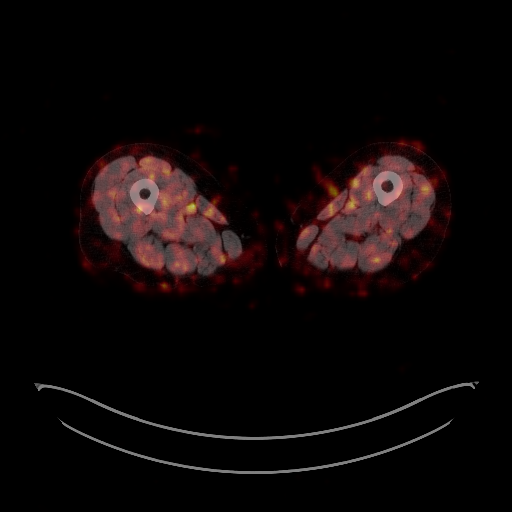
[im 73/219]
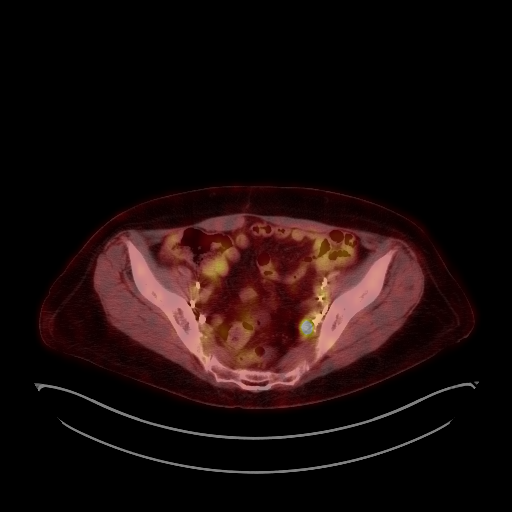
[im 146/219]
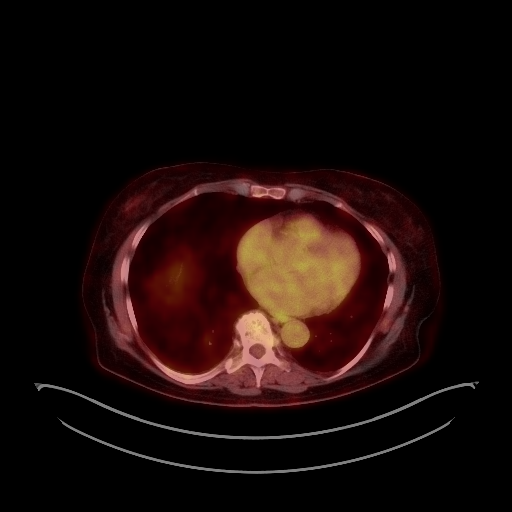
[im 219/219]
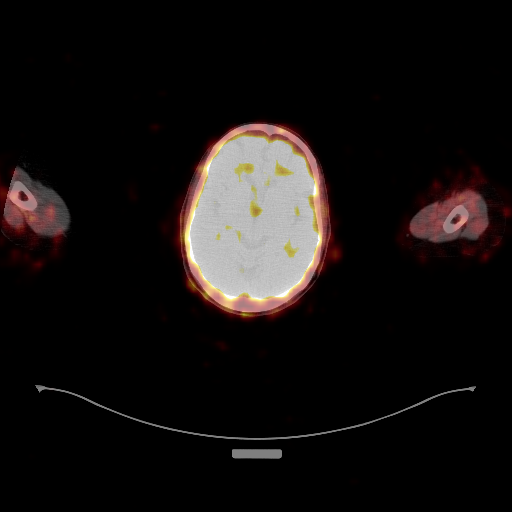

[Series 606: <mip collection> · coronal · 1.85mm/px · 1 of 32 slices shown (2 of 2)]
[im 1/32]
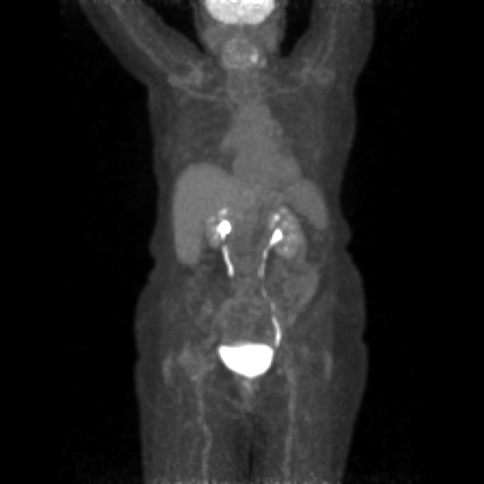

[Series 607: fused cor · 1 of 35 slices shown (2 of 2)]
[im 1/35]
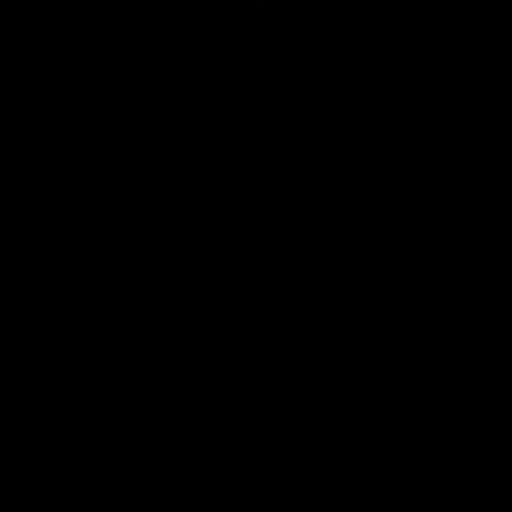

[Series 608: range-ct sk_thigh 5.0 bf37-tra-<alpha range> · 4 of 219 slices shown (2 of 2)]
[im 1/219]
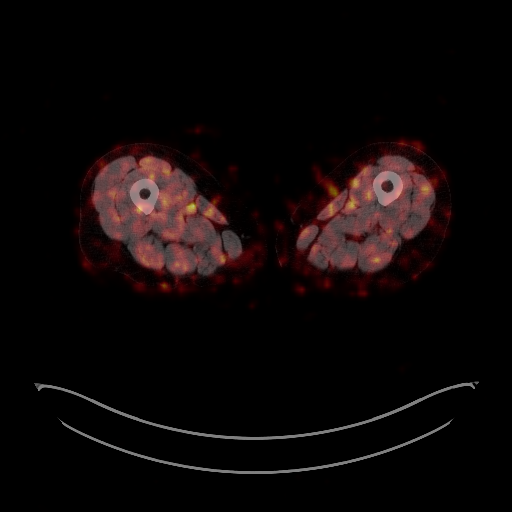
[im 73/219]
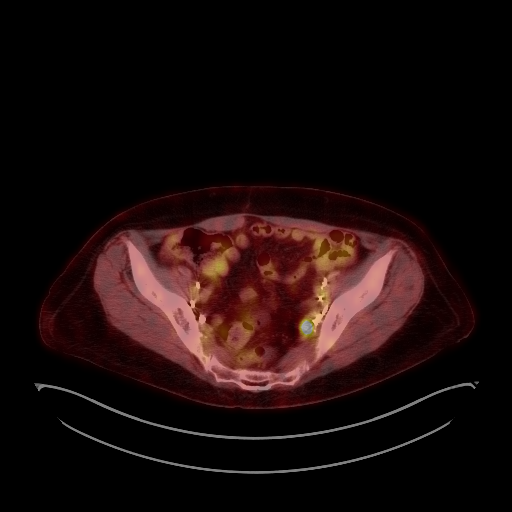
[im 146/219]
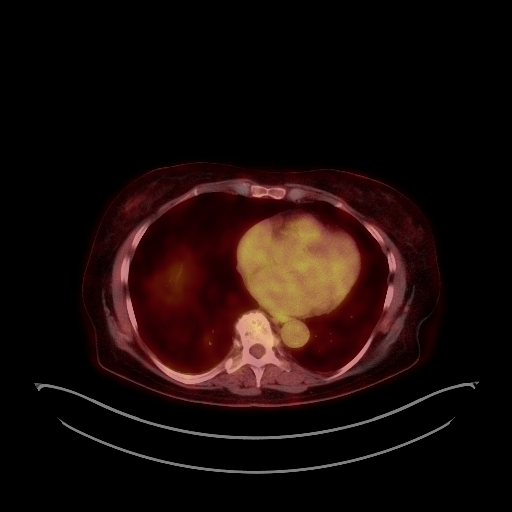
[im 219/219]
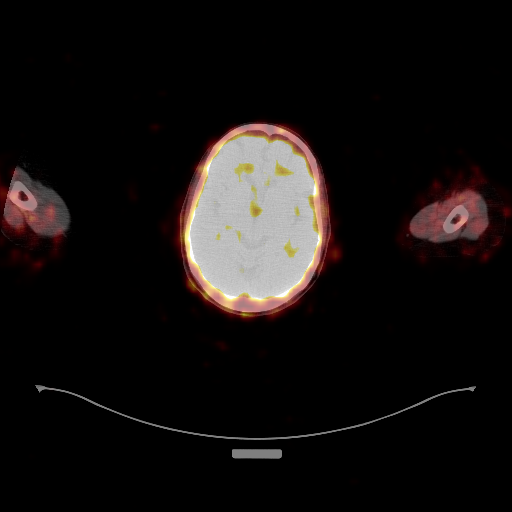

[24 of 25 positions shown; findings below may reference images not displayed]

FINDINGS: Mediastinal blood pool activity: SUV max

Liver activity: SUV max NA

NECK: No hypermetabolic lymph nodes in the neck.

Incidental CT findings: New focus of increased radiotracer uptake
within the left posterior mandible localizing to the posterior molar
where a periapical hypodensity is identified possibly representing a
periapical abscess.

CHEST: No hypermetabolic mediastinal or hilar nodes. No suspicious
pulmonary nodules on the CT scan.

Incidental CT findings: Unchanged tiny nodule in the periphery of
the right lower lobe measuring 3 mm, image 36/8. This is too small
to reliably characterize by PET-CT. Stable postsurgical changes
within the left upper lobe. Coronary artery calcifications and
aortic atherosclerosis.

ABDOMEN/PELVIS: No abnormal hypermetabolic activity within the
liver, pancreas, adrenal glands, or spleen. No hypermetabolic lymph
nodes in the abdomen or pelvis.

As noted on the previous exam there is increased radiotracer
activity near the vaginal introitus. On today's exam the SUV max is
equal to 5.96. This is compared with 9.17 on the previous exam.

Morphologically benign appearing left inguinal lymph node 0.9 cm and
has mild tracer uptake with an SUV max 2.97, image 170/4. Previously
this measured 0.9 cm within SUV max of 1.8.

Incidental CT findings: Previous retroperitoneal and bilateral
pelvic node dissection. Status post hysterectomy. Aortic
atherosclerotic calcifications. No ascites.

SKELETON: No focal hypermetabolic activity to suggest skeletal
metastasis.

Incidental CT findings: none
IMPRESSION: 1. Interval decrease in degree of FDG uptake associated with the
vaginal introitus. The SUV max on today's study is equal to
compared with 9.17 previously.
2. No highly concerning features to suggest nodal metastasis or
distant metastatic disease. Mildly increased tracer uptake within
and morphologically benign left inguinal lymph node is identified
which warrants attention on follow-up imaging.
3. Stable postoperative changes within the left upper lobe without
signs to suggest locally recurrent tumor.
4. Aortic Atherosclerosis ([id number]). Coronary artery
calcifications.

## 2023-12-25 ENCOUNTER — Encounter: Payer: Self-pay | Admitting: Gynecologic Oncology

## 2023-12-25 ENCOUNTER — Inpatient Hospital Stay: Attending: Gynecologic Oncology | Admitting: Gynecologic Oncology

## 2023-12-25 VITALS — BP 145/65 | HR 61 | Temp 98.6°F | Resp 19 | Wt 158.8 lb

## 2023-12-25 DIAGNOSIS — Z9079 Acquired absence of other genital organ(s): Secondary | ICD-10-CM | POA: Diagnosis not present

## 2023-12-25 DIAGNOSIS — Z86002 Personal history of in-situ neoplasm of other and unspecified genital organs: Secondary | ICD-10-CM | POA: Diagnosis not present

## 2023-12-25 DIAGNOSIS — Z90722 Acquired absence of ovaries, bilateral: Secondary | ICD-10-CM | POA: Insufficient documentation

## 2023-12-25 DIAGNOSIS — Z8541 Personal history of malignant neoplasm of cervix uteri: Secondary | ICD-10-CM | POA: Insufficient documentation

## 2023-12-25 DIAGNOSIS — N904 Leukoplakia of vulva: Secondary | ICD-10-CM | POA: Insufficient documentation

## 2023-12-25 DIAGNOSIS — Z90710 Acquired absence of both cervix and uterus: Secondary | ICD-10-CM | POA: Insufficient documentation

## 2023-12-25 DIAGNOSIS — Z8544 Personal history of malignant neoplasm of other female genital organs: Secondary | ICD-10-CM | POA: Insufficient documentation

## 2023-12-25 DIAGNOSIS — N9089 Other specified noninflammatory disorders of vulva and perineum: Secondary | ICD-10-CM | POA: Diagnosis not present

## 2023-12-25 DIAGNOSIS — Z923 Personal history of irradiation: Secondary | ICD-10-CM | POA: Diagnosis not present

## 2023-12-25 DIAGNOSIS — Z9221 Personal history of antineoplastic chemotherapy: Secondary | ICD-10-CM | POA: Diagnosis not present

## 2023-12-25 DIAGNOSIS — C519 Malignant neoplasm of vulva, unspecified: Secondary | ICD-10-CM

## 2023-12-25 NOTE — Progress Notes (Signed)
 Gynecologic Oncology Return Clinic Visit  12/25/23  Reason for Visit: surveillance   Treatment History: Oncology History Overview Note  Patient followed due to HX pulmonary nodules noted 2011  Primary cancer of left upper lobe of lung (HCC)   Staging form: Lung, AJCC 7th Edition   - Clinical stage from 09/27/2015: Stage IA (T1b, N0, M0) - Signed by Sherrod Sherrod, MD on 09/27/2015  PDL-1 90%    Primary cancer of left upper lobe of lung (HCC)  07/12/2015 Imaging   CT Chest IMPRESSION:  The solid component of the large part solid nodule in the left upper lobe measures 9 x 3 mm (mean diameter 6 mm.) persistent part solid nodules with solid components greater than or equal to 6 mm should be considered highly suspicious for pulmonary adenocarcinoma   08/01/2015 Initial Diagnosis   Primary cancer of left upper lobe of lung (HCC)   08/01/2015 Surgery   PROCEDURE:  Left video-assisted thoracoscopy,           Wedge resection,           Thoracoscopic lingular sparing left upper lobectomy           Lymph node dissection           On-Q local anesthetic catheter placement   08/01/2015 Pathology Results   Lung, resection (segmental or lobe), Left Upper Lobe - ADENOCARCINOMA, WELL DIFFERENTIATED, SPANNING 2.5 CM. - THE SURGICAL RESECTION MARGINS ARE NEGATIVE FOR CARCINOMA. - THERE IS NO EVIDENCE OF CARCINOMA IN 1 OF 1 LYMPH NODE (   12/18/2022 Surgery   Partial simple left vulvectomy    12/18/2022 Pathology Results   A. PERINEUM, LEFT, EXCISION:  - Invasive well to moderately differentiated keratinizing squamous cell  carcinoma, 2.1 cm  - Carcinoma invades for depth of about 2 mm  - No evidence of lymphovascular invasion  - All resection margins are negative for carcinoma  - Lateral resection margins are negative for dysplasia    Vulvar cancer (HCC)  11/01/2018 Initial Diagnosis   Vulvar cancer   06/03/2022 Cancer Staging   Staging form: Vulva, AJCC 8th Edition - Pathologic stage  from 06/03/2022: Stage IB (pT1b, pN0, cM0) - Signed by Lonn Hicks, MD on 06/03/2022 Stage prefix: Initial diagnosis   12/18/2022 Pathology Results   SURGICAL PATHOLOGY CASE: [id number] [name] Surgical Pathology Report  Clinical History: Recurrent vulvar cancer (crm)  FINAL MICROSCOPIC DIAGNOSIS:  A. 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 She underwent a total abdominal hysterectomy and bilateral salpingo-oophorectomy, pelvic and para-aortic lymphadenectomy. She received postoperative whole pelvis radiation therapy.    02/06/2015 Surgery   In December 2016 the patient was found to have a new primary squamous cell carcinoma the vulva undergoing a modified radical vulvectomy on 02/06/2015. She underwent a modified radical vulvectomy at [facility] on 02/06/2015. She was found to have a 3 cm vulvar lesion of the posterior vulva extending into the posterior vagina. A rhomboid flap was required to close the incision.  Final pathology showed some close surgical margins. However, the specimen margins were toward the time of  surgery and it was Dr Layman impression that they were widely around the primary cancer. She discussed options with the patient and they agreed to monitor her closely but not recommend any radiation therapy to the vulva.   01/13/2017 Surgery   She experienced recurrent vulvar cancer October 2018 managed by a small modified radical vulvectomy of the right side on January 13, 2017.  Depth  of invasion was 4 mm all margins were negative    03/13/2017 PET scan   1. No evidence of recurrent or metastatic disease. 2. Aortic atherosclerosis (ICD10-170.0). Coronary artery calcification.   07/20/2019 Miscellaneous   Examination of the vulva during routine surveillance on 07/20/19 showed a friable, verrucous lesion measuring approximately 2 cm was seen on the anterior wall of the suburethral vagina extending to the introitus and wrapping around to the right mid vagina. This was concerning for recurrence and was biopsied at that appointment. 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    08/11/2019 Miscellaneous   On 08/11/19 she underwent total vaginectomy. Intraoperative findings were significant for a forshortened 3cm vagina, friable 4cm plaque on the anterior vagina extending to the right fornix and left posterior vaginal wall. This necessitated a total vaginectomy to resect the lesion grossly. Her tissues were extremely friable due to age, prior radiation, menopause and lichen sclerosis. There was unavoidable fragmentation of the tissues during the resection. All gross visible disease was removed. Surgery was uncomplicated, and she reported no postop pain.  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 The suspicious foci are present at the edges of tissue fragments.  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        09/13/2019 PET scan   1. No specific evidence of recurrent vulvar carcinoma or distant metastases. There is nonspecific activity in the region of the vaginal introitus which could reflect urine contamination or vaginal recurrence. Correlation with physical examination recommended.  2. No abnormal thoracic activity status post left upper lobe wedge  resection. 3. Coronary and Aortic Atherosclerosis (ICD10-I70.0).   09/29/2019 - 10/12/2019 Radiation Therapy   The patient received adjuvant vaginal brachytherapy between 09/29/2019 through 10/12/2019.     02/28/2021 PET scan   1. Interval decrease in degree of FDG uptake associated with the vaginal introitus. The SUV max on today's study is equal to 5.96 compared with 9.17 previously. 2. No highly concerning features to suggest nodal metastasis or distant metastatic disease. Mildly increased tracer uptake within and morphologically benign left inguinal lymph node is identified which warrants attention on follow-up imaging. 3. Stable postoperative changes within the left upper lobe without signs to suggest locally recurrent tumor. 4. Aortic Atherosclerosis (ICD10-I70.0). Coronary artery calcifications.   03/11/2021 Initial Diagnosis   Recurrent vaginal cancer   03/20/2021 Procedure   03/20/21: Vaginectomy, CO2 laser of the vulva for recurrent vaginal cancer, high-grade vulvar dysplasia.  Vaginal excision showed squamous mucosa with ulceration and inflammation.  No malignancy or dysplasia identified.  Rare atypical radiation fibroblast embedded.    10/24/2021 Imaging   1. No acute abnormality of the abdomen or pelvis. 2. Aortic Atherosclerosis (ICD10-I70.0).   02/19/2022 Surgery   Preop Diagnosis: history of vulvar and vaginal cancer, suspicion for recurrent vulvar cancer   Postoperative Diagnosis: same as above   Surgery: Partial simple right/posterior vulvectomy with rotational rhomboid flap    Surgeons: Viktoria,  Odean MD    Pathology: posterior vulva (6-8 o'c) with stitch at 12, additional superior, lateral and inferior margins, deep margin   Operative findings: 2 x 2.5 cm verrucous appearing lesion along the perineum and extending to the introitus, relatively broad 2 cm base. Given size of defect and its location, required rotational flap to close defect. Hyperkeratotic areas within the  significantly foreshortened vagina.   03/13/2022 PET scan    ADDENDUM REPORT: 03/14/2022 08:44   ADDENDUM: I just discussed this study with Dr. Viktoria. The patient had surgery about 3 weeks ago for recurrent disease at the vaginal introitus with no residual disease apparent clinically. Additionally, the patient has had no chemotherapy in the 1 year interval since the prior study and no radiation therapy to the left groin region. In light of this additional history, the changes in the region the vaginal introitus today may well simply represent urinary contaminant rather than residual disease. Further, given lack of interval systemic chemotherapy and no interval focused therapy to the left groin lymph node, the interval stability in this node (9 mm previously versus 10 mm today) and stable low level FDG accumulation ( SUV max = 2.9 today compared to 3.0 previously) would suggest a reactive etiology as more likely, especially in light of the recurrent surgeries to the external genitalia.     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  Approximately 1 x 1 cm raised mass along the right distal vagina from 6-8 o'clock.  Second raised mass along the left vaginal sidewall extending almost up to the vaginal apex measuring approximately 1 x 1.5 cm.  Several small, less than 5 mm, masses along the vaginal apex and superior wall of the posterior vagina.  Well-healed lumbar incision from recent surgery.  Significant loss of architecture of the vulva with erythema  noted of bilateral anterior labia and along the perineum.  Multiple biopsies taken including 2 and 10:00 along the vulva and at 5:00 along the posterior vulva.     05/07/2022 Pathology Results   CASE: [id number] [name] Surgical Pathology Report  Clinical History: Recurrent vulvar cancer (crm)  FINAL MICROSCOPIC DIAGNOSIS:  A. VAGINAL LESION, DISTAL RIGHT, EXCISION:      Invasive squamous cell carcinoma.      Cauterized carcinoma present at the mucosal margin. Deep margin are negative for carcinoma.  B. VAGINAL SIDEWALL, LEFT, EXCISION:      Invasive squamous cell carcinoma.      Cauterized carcinoma present at the mucosal margin. Deep margin are negative for carcinoma.  C. VAGINA, APEX, EXCISION:      At least squamous cell carcinoma in situ.  D. VAGINA, POSTERIOR, BIOPSY:      At least squamous cell carcinoma in situ.  E. VULVA, RIGHT, BIOPSY:      Squamous mucosa with acute inflammation and reactive epithelial changes.      Negative for dysplasia or malignancy.  F. VULVA, LEFT, BIOPSY:      Squamous mucosa with acute and chronic inflammation and reactive epithelial changes.      Negative for dysplasia or malignancy.  G. VULVA, POSTERIOR, 5:00, BIOPSY:      Squamous mucosa with acute and chronic inflammation and reactive epithelial changes.      Negative for dysplasia or malignancy.     06/03/2022 Cancer Staging   Staging form: Vagina, AJCC 8th Edition - Clinical: cT1, cN0, cM0 - Signed by Lonn Hicks, MD on 06/03/2022   06/09/2022 - 06/09/2022 Chemotherapy   Patient is on Treatment Plan : UTERINE Pembrolizumab  (200) q21d     06/09/2022 - 06/09/2022 Chemotherapy   Patient is on Treatment Plan : UTERINE Carboplatin  (AUC 5) q21d     06/30/2022 - 01/15/2023 Chemotherapy   Patient is on Treatment Plan : UTERINE Pembrolizumab  (400) q42d     09/26/2022 PET scan   NM PET Image Restage (PS) Skull Base to Thigh (F-18 FDG)  Result Date: 09/25/2022 CLINICAL  DATA:  Subsequent treatment strategy for recurrent vaginal cancer. Currently on immunotherapy. Biopsy 05/07/2022. EXAM: NUCLEAR MEDICINE PET SKULL BASE TO THIGH TECHNIQUE: 7.7 mCi F-18 FDG was injected intravenously. Full-ring PET imaging was performed from the skull base to thigh after the radiotracer. CT data was obtained and used for attenuation correction and anatomic localization. Fasting blood glucose: 107 mg/dl COMPARISON:  97/98/7975 FINDINGS: Mediastinal blood pool activity: SUV max 2.2 Liver activity: SUV max NA NECK: No areas of abnormal hypermetabolism. Incidental CT findings: Cerebral atrophy. No cervical adenopathy. Bilateral carotid atherosclerosis. CHEST: No pulmonary parenchymal or thoracic nodal hypermetabolism. Incidental CT findings: Surgical changes about the left hilum and left upper lobe consistent with clinical history of prior wedge resection. Aortic and coronary artery calcification. Esophageal fluid level on 60/4. 3 mm right lower lobe pulmonary nodule on 78/4 is unchanged. ABDOMEN/PELVIS: No abdominopelvic parenchymal hypermetabolism. Abdominopelvic nodal dissection. Mildly hypermetabolic left inguinal nodes. The more cephalad measures 8 mm and a S.U.V. max of 2.6 on 168/4 versus 6 mm and a S.U.V. max of 1.8 on the prior. More caudal measures 1.0 cm and a S.U.V. max of 3.7 on 173/4 versus similar in size and a S.U.V. max of 2.9 on the prior. Incidental CT findings: Scarred upper pole right kidney. Normal adrenal glands. Abdominal aortic atherosclerosis. Hysterectomy. Pelvic floor laxity with mild cystocele. SKELETON: No abnormal marrow activity. Incidental CT findings: Right-greater-than-left hip osteoarthritis. IMPRESSION: 1. Left inguinal nodes which demonstrate mild increase in hypermetabolism. 1 has minimally enlarged. These are again favored to be reactive but warrant follow-up attention. 2. Otherwise, no evidence of recurrent or metastatic disease, status post hysterectomy and  abdominopelvic node dissection. 3. Incidental findings, including: Coronary artery atherosclerosis. Aortic Atherosclerosis (ICD10-I70.0). Esophageal air fluid level suggests dysmotility or gastroesophageal reflux. Electronically Signed   By: Rockey Kilts M.D.   On: 09/25/2022 14:57        Interval History: Doing well.  Denies any significant change to vulvar symptoms.  Continues to struggle with vulvar irritation despite any of the treatments that she has used.  Only thing that seems to be helpful is using warm water  from a Peri bottle before she urinates.  Denies any bleeding or discharge.  Denies feeling any new lesions.  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 cath due to past injury  History of benign kidney tumor excluding renal pelvis 07/26/2009   s/p  right partial nephrectomy for angiomyolipoma   History of cervical cancer 03/1995   s/p   TAH /  BSO for poorly differentiated cervical carcinoma.   History of endometrial cancer 03/1995   s/p  TAH/ BSO for cervical cancer w/ incidental finding endometrial cancer also ,  completed radiation 11/ 1997   History of external beam radiation therapy 1997   completed abominal radation for endometrial cancer 11/ 1997   History of radiation therapy 10/12/2019   [facility]DR brachytherapy  09/29/2019-10/12/2019  Dr Lynwood Nasuti  (recurrent VAIN)   History of resection of meningioma 11/1968   T12   History of small bowel obstruction 12/2008   partial SBO admission 12/2008;  recurrent SBO in 02/2011;  02/ 2017;   so far no surgical intervention (per pt has a narrowed distal colon)   Hyperlipidemia    Hypertension    Hypothyroidism    followed by pcp   Lichen sclerosus of vulva    per pt  w/ chronic itching   Nocturia    Pre-diabetes    Primary cancer of left upper lobe of lung (HCC) 08/01/2015   previously followed by oncologist--- dr claretta--- released 09-13-2018  , pt non-smokerStage 1  Non-small cell ,  no chemo or radiation,  survillance   (bilateral nodules for noted in 2011)   Recurrent vaginal cancer (HCC) 08/2019   GYN oncologist--- dr viktoria---- total vaginectomy and completed radiation 09/ 2021;    laser ablation 02/ 2023   Recurrent vulvar cancer [facility]) 2016   GYN oncologist---- dr viktoria;   new dx 12/ 2016  s/p radica vulvectomy @ [facility] by dr devonna recurrent 10/ 2018 s/p modified vulvecotmy;  06/ 2021 invasive to vagina/ recurrent vulva s/p total vagainectomy & radiation; laser ablation 02/ 2023;   new lesion 12/ 2023 s/p simplec vulectomy w/ flap 01/ 2024    Past Surgical History:  Procedure Laterality Date   CATARACT EXTRACTION W/ INTRAOCULAR LENS IMPLANT Bilateral 2006   CO2 LASER APPLICATION N/A 03/20/2021   Procedure: CO2 LASER APPLICATION to vagina and vuvla;  Surgeon: Viktoria Comer SAUNDERS, MD;  Location: [facility];  Service: Gynecology;  Laterality: N/A;   CO2 LASER APPLICATION N/A 05/07/2022   Procedure: POSSIBLE CO2 LASER APPLICATION TO VAGINA/VULVA;  Surgeon: Viktoria Comer SAUNDERS, MD;  Location: [facility];  Service: Gynecology;  Laterality: N/A;   LYMPH NODE DISSECTION Left 08/01/2015   Procedure: LYMPH NODE DISSECTION;  Surgeon: Elspeth JAYSON Millers, MD;  Location: [facility] OR;  Service: Thoracic;  Laterality: Left;   RADICAL VULVECTOMY  02/06/2015   @[facility]  by dr devonna   ROBOTIC ASSITED PARTIAL NEPHRECTOMY  07/26/2009   @[facility]  by dr renda   SEGMENTECOMY Left 08/01/2015   Procedure: Left Upper Lobe SEGMENTECTOMY;  Surgeon: Elspeth JAYSON Millers, MD;  Location: [facility] OR;  Service: Thoracic;  Laterality: Left;   TONSILLECTOMY AND ADENOIDECTOMY     child   TOTAL ABDOMINAL HYSTERECTOMY W/ BILATERAL SALPINGOOPHORECTOMY   03/31/1995   @[facility]  by dr devonna;   TAH/BSO pelvic and periaortic lymphadenectomy   TUBAL LIGATION Bilateral    yrs ago   TUMOR REMOVAL  11/1968   meningioma  T 12   VAGINECTOMY, PARTIAL N/A 08/11/2019   Procedure: TOTAL VAGINECTOMY;  Surgeon: Eloy Herring, MD;  Location: [facility];  Service: Gynecology;  Laterality: N/A;   VAGINECTOMY, PARTIAL N/A 03/20/2021   Procedure: vaginal excision;  Surgeon: Viktoria Comer  R, MD;  Location: [facility];  Service: Gynecology;  Laterality: N/A;   VIDEO ASSISTED THORACOSCOPY (VATS)/WEDGE RESECTION Left 08/01/2015   Procedure: VIDEO ASSISTED THORACOSCOPY (VATS)/WEDGE RESECTION;  Surgeon: Elspeth JAYSON Millers, MD;  Location: [facility] OR;  Service: Thoracic;  Laterality: Left;   VULVA MARYBETH BIOPSY N/A 02/14/2021   Procedure: VAGINAL AND VULVAR BIOPSY;  Surgeon: Viktoria Comer SAUNDERS, MD;  Location: [facility];  Service: Gynecology;  Laterality: N/A;   VULVA /PERINEUM BIOPSY N/A 05/07/2022   Procedure: POSSIBLE VAGINAL  BIOPSY;  Surgeon: Viktoria Comer SAUNDERS, MD;  Location: [facility];  Service: Gynecology;  Laterality: N/A;   VULVAR LESION REMOVAL N/A 02/19/2022   Procedure: VULVAR LESION EXCISION, ROTATIONAL FLAP CLOSURE;  Surgeon: Viktoria Comer SAUNDERS, MD;  Location: [facility];  Service: Gynecology;  Laterality: N/A;   VULVECTOMY N/A 05/07/2022   Procedure: POSSIBLE WIDE EXCISION VULVECTOMY/VAGINA;  Surgeon: Viktoria Comer SAUNDERS, MD;  Location: [facility];  Service: Gynecology;  Laterality: N/A;   VULVECTOMY N/A 12/18/2022   Procedure: WIDE EXCISION VULVECTOMY;  Surgeon: Viktoria Comer SAUNDERS, MD;  Location: [facility];  Service: Gynecology;  Laterality: N/A;   VULVECTOMY PARTIAL  01/13/2017   @[facility]  by dr devonna    Family History  Problem Relation Age of Onset   Hypertension Other    Diabetes Other    Stroke Other    CAD Other    Colon cancer Neg Hx    Breast cancer Neg  Hx    Ovarian cancer Neg Hx    Endometrial cancer Neg Hx    Pancreatic cancer Neg Hx    Prostate cancer Neg Hx     Social History   Socioeconomic History   Marital status: Divorced    Spouse name: Not on file   Number of children: 2   Years of education: Not on file   Highest education level: Not on file  Occupational History   Not on file  Tobacco Use   Smoking status: Never   Smokeless tobacco: Never  Vaping Use   Vaping status: Never Used  Substance and Sexual Activity   Alcohol use: Not Currently    Comment: Occ   Drug use: Never   Sexual activity: Yes    Birth control/protection: Surgical  Other Topics Concern   Not on file  Social History Narrative   Lives at home alone, works out at [employer] 6 days a week, still very active. Has an ex-husband who she is in contact with. Son died of an MI and another son in [place].    Social Drivers of Corporate Investment Banker Strain: Not on file  Food Insecurity: Not on file  Transportation Needs: Not on file  Physical Activity: Not on file  Stress: Not on file  Social Connections: Not on file    Current Medications:  Current Outpatient Medications:    acetaminophen  (TYLENOL ) 500 MG tablet, Take 500-1,000 mg by mouth every 6 (six) hours as needed (pain.)., Disp: , Rfl:    acyclovir (ZOVIRAX) 800 MG tablet, Take 800 mg by mouth 2 (two) times daily as needed (cold sores)., Disp: , Rfl:    amLODipine (NORVASC) 5 MG tablet, Take 5 mg by mouth every evening., Disp: , Rfl:    Ascorbic Acid (VITAMIN C PO), Take 500 mg by mouth in the morning., Disp: , Rfl:    benazepril -hydrochlorthiazide (LOTENSIN  HCT) 20-25 MG tablet, Take 1 tablet by mouth in the morning., Disp: ,  Rfl:    BIOTIN PO, Take 1 tablet by mouth in the morning., Disp: , Rfl:    Cholecalciferol (VITAMIN D3 PO), Take 2,000 Units by mouth in the morning and at bedtime., Disp: , Rfl:    dexamethasone  (DECADRON ) 0.5 MG/5ML solution, Rinse with a teaspoonful for 2  minutes then spit, use 4 times a day, Disp: 300 mL, Rfl: 3   docusate sodium  (COLACE) 100 MG capsule, Take 100 mg by mouth 2 (two) times daily., Disp: , Rfl:    doxylamine, Sleep, (UNISOM) 25 MG tablet, Take 12.5 mg by mouth at bedtime., Disp: , Rfl:    ibuprofen  (ADVIL ) 400 MG tablet, Take 400 mg by mouth every 6 (six) hours as needed., Disp: , Rfl:    levothyroxine  (SYNTHROID , LEVOTHROID) 75 MCG tablet, Take 75 mcg by mouth daily before breakfast., Disp: , Rfl:    MAGNESIUM  PO, Take 1 tablet by mouth every evening., Disp: , Rfl:    Multiple Vitamin (MULTIVITAMIN WITH MINERALS) TABS tablet, Take 1 tablet by mouth in the morning., Disp: , Rfl:    Omega-3 Fatty Acids (FISH OIL PO), Take 1 capsule by mouth in the morning., Disp: , Rfl:    polyethylene glycol (MIRALAX  / GLYCOLAX ) packet, Take 11.3333 g by mouth at bedtime. 2/3 capful, Disp: , Rfl:    simvastatin  (ZOCOR ) 20 MG tablet, Take 20 mg by mouth at bedtime. , Disp: , Rfl:    TURMERIC PO, Take 1 capsule by mouth every evening., Disp: , Rfl:   Review of Systems: Denies appetite changes, fevers, chills, fatigue, unexplained weight changes. Denies hearing loss, neck lumps or masses, mouth sores, ringing in ears or voice changes. Denies cough or wheezing.  Denies shortness of breath. Denies chest pain or palpitations. Denies leg swelling. Denies abdominal distention, pain, blood in stools, constipation, diarrhea, nausea, vomiting, or early satiety. Denies pain with intercourse, dysuria, frequency, hematuria or incontinence. Denies hot flashes, pelvic pain, vaginal bleeding or vaginal discharge.   Denies joint pain, back pain or muscle pain/cramps. Denies itching, rash, or wounds. Denies dizziness, headaches, numbness or seizures. Denies swollen lymph nodes or glands, denies easy bruising or bleeding. Denies anxiety, depression, confusion, or decreased concentration.  Physical Exam: BP (!) 145/65 (BP Location: Right Arm, Patient Position:  Sitting)   Pulse 61   Temp 98.6 F (37 C) (Oral)   Resp 19   Wt 158 lb 12.8 oz (72 kg)   SpO2 100%   BMI 26.43 kg/m  General: Alert, oriented, no acute distress. HEENT: Normocephalic, atraumatic, sclera anicteric. Chest: Unlabored breathing on room air.  Lungs clear to auscultation bilaterally. Cardiovascular: Regular rate and rhythm, no murmurs or rubs appreciated. Abdomen: Soft, nondistended, nontender.   GU: Continued erythema of the vulva, stable from last visit.  More laterally along the vulva there is now a new condylomatous appearing lesion measuring approximately 1 cm.  Vulvar biopsy procedure Preoperative diagnosis: Vulvar lesion Postoperative diagnosis: Same as above Physician: Viktoria MD Estimated blood loss: Minimal Specimens: Vulvar biopsy at 4 o'clock Procedure: After the procedure was discussed with the patient, she gave verbal consent.  She was already in dorsal lithotomy position with the lesion visualized. The vulvar lesion was then cleansed with Betadine x 3.  Injected for local anesthesia.  After adequate time was given, Tischler forceps were used to take a biopsy from the posterior edge of the lesion.  This was placed in formalin.  Silver  nitrate was used to achieve hemostasis.  Overall the patient tolerated the procedure  well.    Laboratory & Radiologic Studies: None new  Assessment & Plan: [name] Richards is a 78 y.o. 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  After surgery for resection March 2024, plan made for adjuvant chemotherapy.  Did not tolerate carboplatin , started pembrolizumab  setting of a CPS score of 90%. Recurrence diagnosed in October 2024 s/p resection with negative margins on 12/18/22. Given treatment options and overall tolerance of immunotherapy, decision then had been to restart pembrolizumab .  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 Biopsy taken last on 09/18/2023 from the vulva showing reactive squamous mucosa with marked spongiosis with chronic inflammation, no dysplasia or malignancy.   She is overall doing well. New small left-sided lesion seen more lateral than prior area biopsied.  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   She continues to have vulvar irritation and symptoms related to her lichen sclerosis.      We had previously discussed option of vulvar radiation after her last recurrence.  She ultimately decided to cancel her appointment with Dr. Shannon.  Open to radiation in the future.     I will call her with biopsy results.  We will tentatively hold some time for surgery on 12/3 if biopsy shows high-grade dysplasia or malignancy.  22 minutes of total time was spent for this patient encounter, including preparation, face-to-face counseling with the patient and coordination of care, and documentation of the encounter.  Comer Dollar, MD  Division of Gynecologic Oncology  Department of Obstetrics and Gynecology  [facility]

## 2023-12-25 NOTE — Patient Instructions (Addendum)
 It was good to see you today.  I will call you with your biopsy results once they are back next week.  We will tentatively hold some time for a small procedure if the biopsy confirms high-grade precancer or cancer on 12/3.

## 2023-12-30 ENCOUNTER — Ambulatory Visit: Payer: Self-pay | Admitting: Gynecologic Oncology

## 2023-12-30 LAB — SURGICAL PATHOLOGY

## 2024-02-01 IMAGING — CR DG HIP (WITH OR WITHOUT PELVIS) 2-3V*R*
3 series · 3 of 3 positions shown · non-contrast
Comparison: PET-CT 02/28/2021.

CLINICAL DATA: Right hip pain.

EXAM:
DG HIP (WITH OR WITHOUT PELVIS) 2-3V RIGHT

[w pelvis upright]
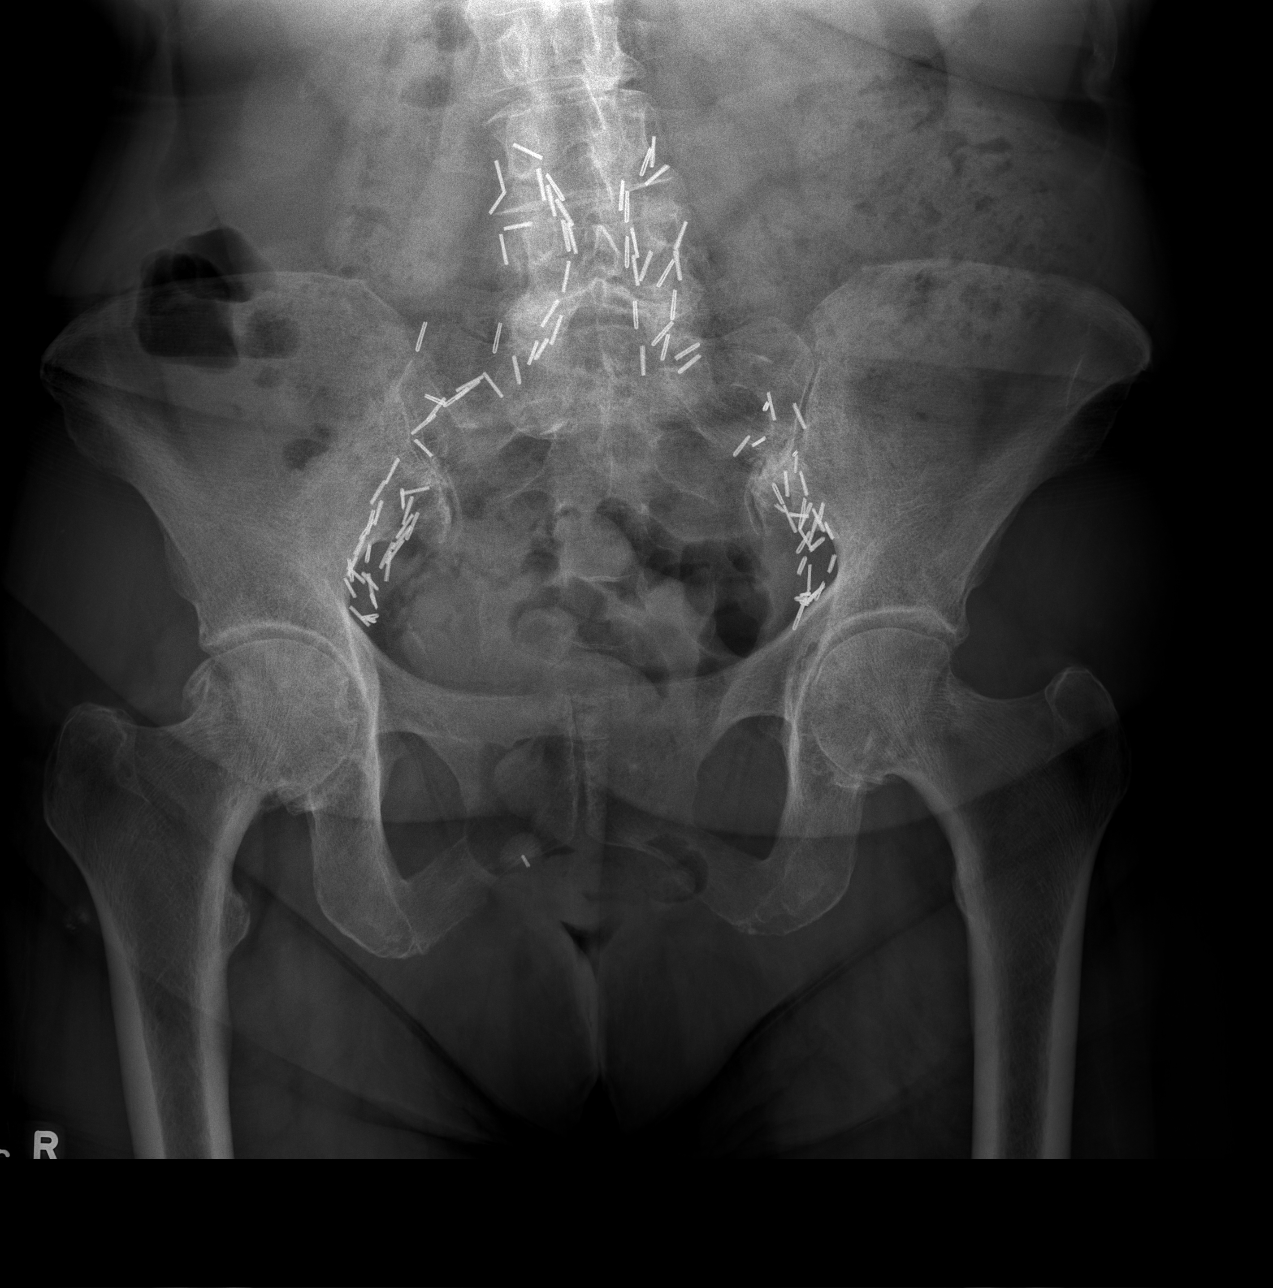

[w hip ap right]
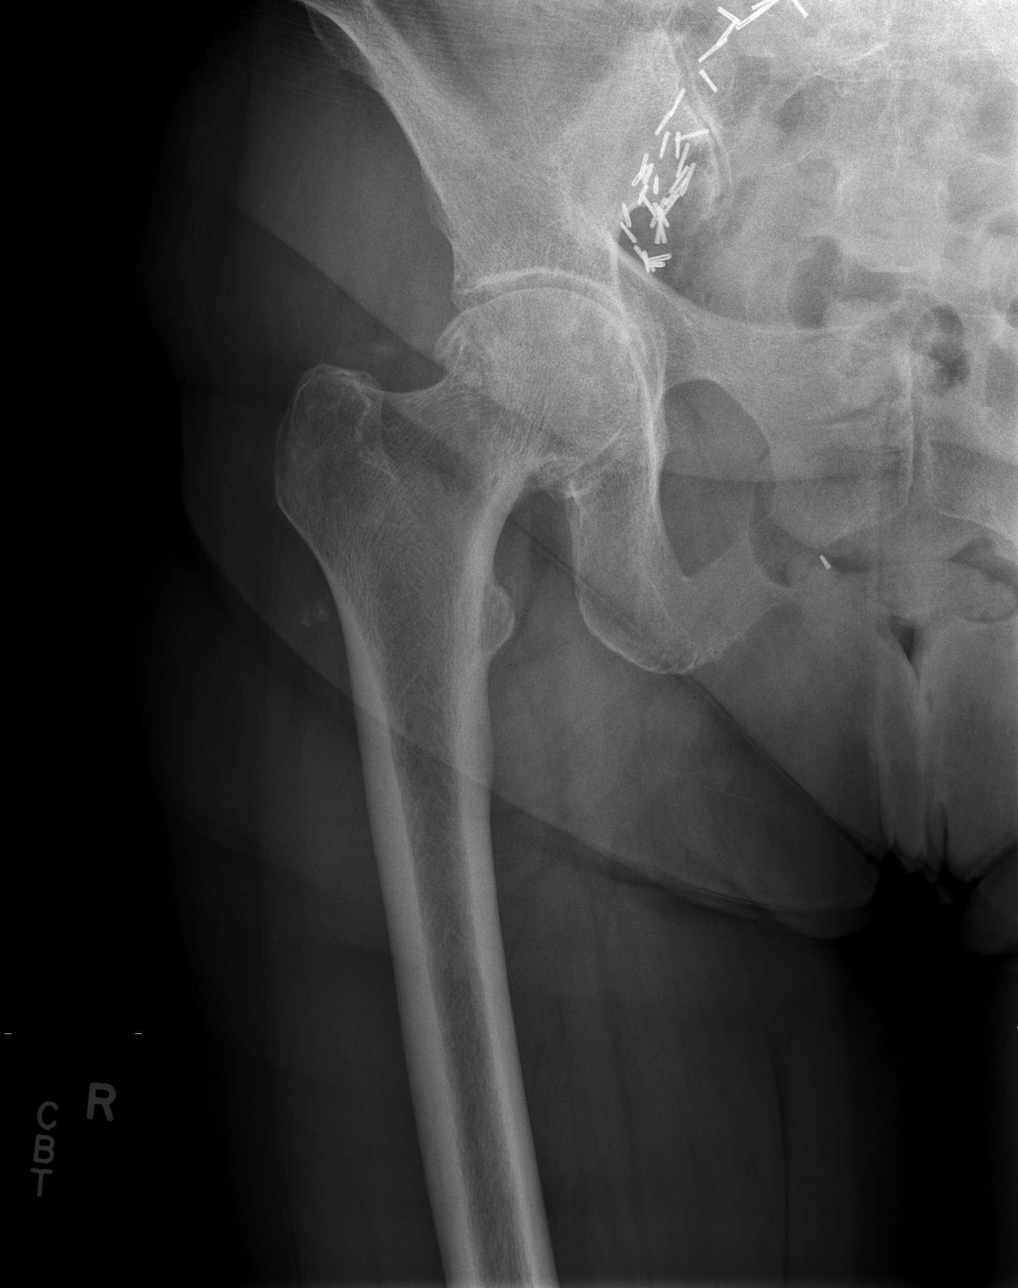

[w hip frog right]
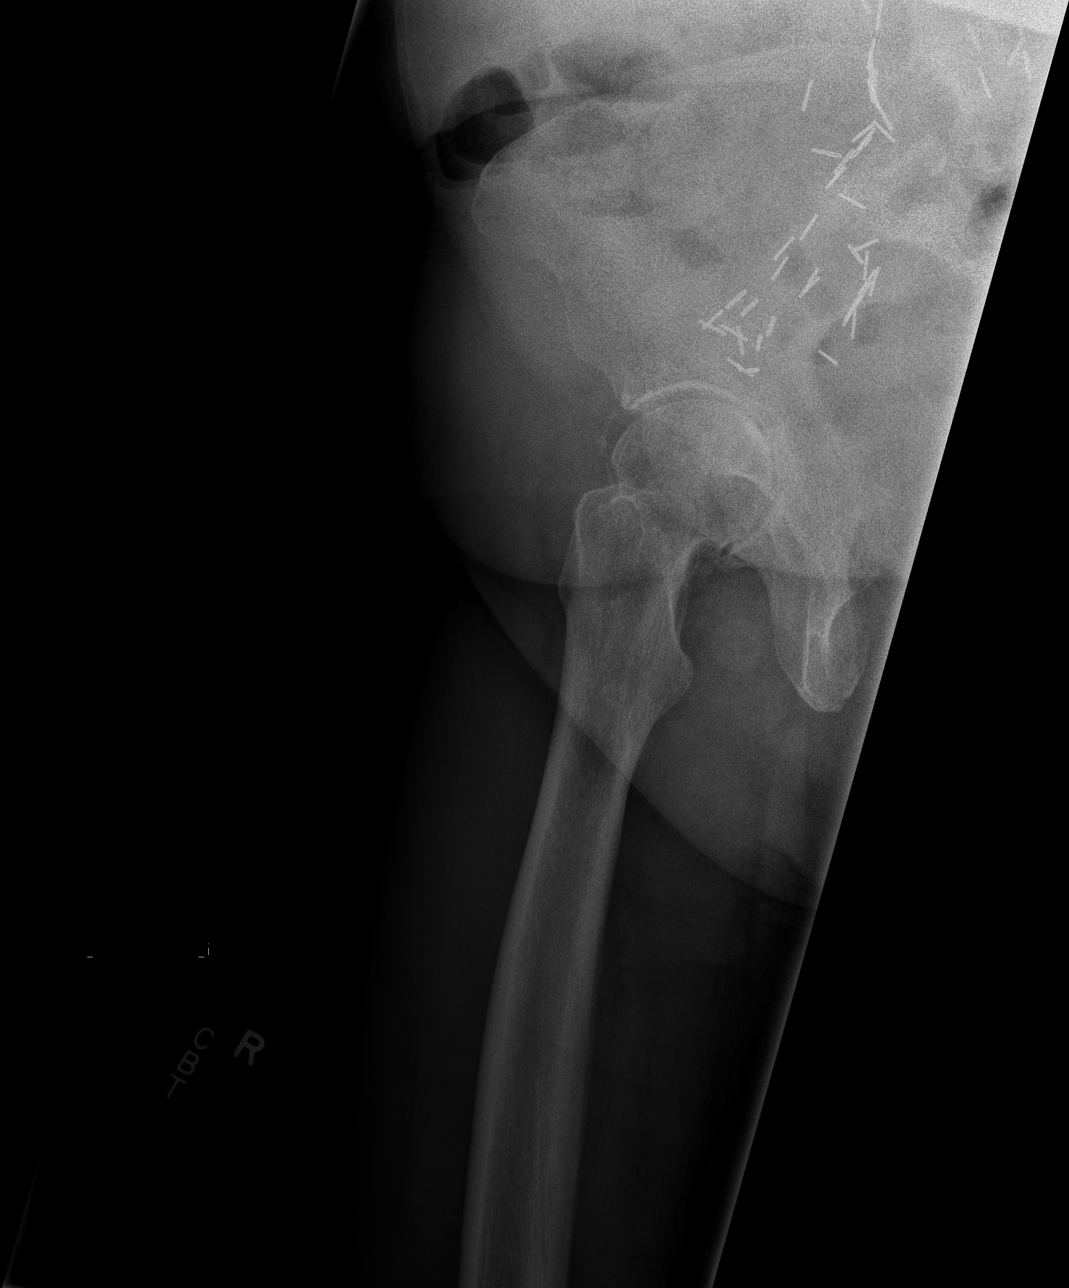

[3 of 3 positions shown; findings below may reference images not displayed]

FINDINGS: There is no acute fracture or dislocation. There are moderate
degenerative changes of the right hip with joint space narrowing and
osteophyte formation. There are mild degenerative changes of the
left hip and pubic symphysis. There also mild degenerative changes
the sacroiliac joints. No focal osseous lesions are identified.
Extensive surgical clips are seen throughout pelvis and lower
abdomen.
IMPRESSION: 1. No acute bony abnormality.
2. Degenerative changes as above.

## 2024-03-24 ENCOUNTER — Inpatient Hospital Stay: Attending: Gynecologic Oncology | Admitting: Gynecologic Oncology
